# Patient Record
Sex: Female | Born: 1950
Health system: Southern US, Community
[De-identification: ages and names within clinical notes are randomized; demographics above are authoritative.]

## PROBLEM LIST (undated history)

## (undated) DIAGNOSIS — G43909 Migraine, unspecified, not intractable, without status migrainosus: Secondary | ICD-10-CM

## (undated) DIAGNOSIS — M1990 Unspecified osteoarthritis, unspecified site: Secondary | ICD-10-CM

## (undated) DIAGNOSIS — R000 Tachycardia, unspecified: Secondary | ICD-10-CM

## (undated) DIAGNOSIS — E876 Hypokalemia: Secondary | ICD-10-CM

## (undated) DIAGNOSIS — R0609 Other forms of dyspnea: Secondary | ICD-10-CM

## (undated) DIAGNOSIS — G20 Parkinson's disease: Secondary | ICD-10-CM

## (undated) DIAGNOSIS — E7800 Pure hypercholesterolemia, unspecified: Secondary | ICD-10-CM

## (undated) DIAGNOSIS — B019 Varicella without complication: Secondary | ICD-10-CM

## (undated) DIAGNOSIS — E669 Obesity, unspecified: Secondary | ICD-10-CM

## (undated) DIAGNOSIS — D649 Anemia, unspecified: Secondary | ICD-10-CM

## (undated) DIAGNOSIS — M549 Dorsalgia, unspecified: Secondary | ICD-10-CM

## (undated) DIAGNOSIS — F32A Depression, unspecified: Secondary | ICD-10-CM

## (undated) DIAGNOSIS — R7303 Prediabetes: Secondary | ICD-10-CM

## (undated) DIAGNOSIS — G20A1 Parkinson's disease without dyskinesia, without mention of fluctuations: Secondary | ICD-10-CM

## (undated) DIAGNOSIS — I10 Essential (primary) hypertension: Secondary | ICD-10-CM

## (undated) DIAGNOSIS — R0600 Dyspnea, unspecified: Secondary | ICD-10-CM

## (undated) DIAGNOSIS — G8929 Other chronic pain: Secondary | ICD-10-CM

## (undated) DIAGNOSIS — H269 Unspecified cataract: Secondary | ICD-10-CM

## (undated) DIAGNOSIS — F329 Major depressive disorder, single episode, unspecified: Secondary | ICD-10-CM

## (undated) DIAGNOSIS — I5189 Other ill-defined heart diseases: Secondary | ICD-10-CM

## (undated) DIAGNOSIS — F419 Anxiety disorder, unspecified: Secondary | ICD-10-CM

## (undated) DIAGNOSIS — K219 Gastro-esophageal reflux disease without esophagitis: Secondary | ICD-10-CM

## (undated) HISTORY — DX: Prediabetes: R73.03

## (undated) HISTORY — DX: Major depressive disorder, single episode, unspecified: F32.9

## (undated) HISTORY — DX: Dyspnea, unspecified: R06.00

## (undated) HISTORY — PX: FOOT SURGERY: SHX648

## (undated) HISTORY — DX: Other ill-defined heart diseases: I51.89

## (undated) HISTORY — DX: Obesity, unspecified: E66.9

## (undated) HISTORY — DX: Other chronic pain: G89.29

## (undated) HISTORY — DX: Other forms of dyspnea: R06.09

## (undated) HISTORY — DX: Migraine, unspecified, not intractable, without status migrainosus: G43.909

## (undated) HISTORY — PX: CARDIAC CATHETERIZATION: SHX172

## (undated) HISTORY — DX: Gastro-esophageal reflux disease without esophagitis: K21.9

## (undated) HISTORY — DX: Dorsalgia, unspecified: M54.9

## (undated) HISTORY — DX: Depression, unspecified: F32.A

## (undated) HISTORY — DX: Essential (primary) hypertension: I10

## (undated) HISTORY — DX: Tachycardia, unspecified: R00.0

## (undated) HISTORY — DX: Hypokalemia: E87.6

## (undated) HISTORY — DX: Unspecified cataract: H26.9

## (undated) HISTORY — DX: Pure hypercholesterolemia, unspecified: E78.00

## (undated) HISTORY — DX: Varicella without complication: B01.9

---
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
## 2000-04-15 ENCOUNTER — Encounter: Admission: RE | Admit: 2000-04-15 | Discharge: 2000-04-15 | Payer: Self-pay | Admitting: Family Medicine

## 2000-04-15 ENCOUNTER — Encounter: Payer: Self-pay | Admitting: Family Medicine

## 2003-07-20 HISTORY — PX: BLADDER SUSPENSION: SHX72

## 2005-03-26 ENCOUNTER — Ambulatory Visit (HOSPITAL_COMMUNITY): Admission: RE | Admit: 2005-03-26 | Discharge: 2005-03-26 | Payer: Self-pay | Admitting: Gynecology

## 2005-07-19 HISTORY — PX: ENDOMETRIAL ABLATION: SHX621

## 2005-11-04 ENCOUNTER — Ambulatory Visit: Payer: Self-pay | Admitting: Internal Medicine

## 2005-11-10 ENCOUNTER — Ambulatory Visit: Payer: Self-pay | Admitting: Internal Medicine

## 2005-11-12 ENCOUNTER — Inpatient Hospital Stay (HOSPITAL_BASED_OUTPATIENT_CLINIC_OR_DEPARTMENT_OTHER): Admission: RE | Admit: 2005-11-12 | Discharge: 2005-11-12 | Payer: Self-pay | Admitting: Internal Medicine

## 2005-11-12 ENCOUNTER — Ambulatory Visit: Payer: Self-pay | Admitting: Internal Medicine

## 2005-12-23 ENCOUNTER — Ambulatory Visit: Payer: Self-pay | Admitting: Internal Medicine

## 2005-12-28 ENCOUNTER — Ambulatory Visit: Payer: Self-pay | Admitting: Internal Medicine

## 2006-01-10 ENCOUNTER — Ambulatory Visit: Payer: Self-pay | Admitting: Internal Medicine

## 2006-01-21 ENCOUNTER — Ambulatory Visit: Payer: Self-pay | Admitting: Internal Medicine

## 2006-01-24 ENCOUNTER — Ambulatory Visit: Payer: Self-pay | Admitting: Internal Medicine

## 2006-01-25 ENCOUNTER — Ambulatory Visit: Payer: Self-pay | Admitting: Internal Medicine

## 2006-01-28 ENCOUNTER — Ambulatory Visit: Payer: Self-pay | Admitting: Internal Medicine

## 2006-03-14 ENCOUNTER — Ambulatory Visit: Payer: Self-pay | Admitting: Internal Medicine

## 2006-03-18 ENCOUNTER — Ambulatory Visit: Payer: Self-pay | Admitting: Internal Medicine

## 2006-03-28 ENCOUNTER — Ambulatory Visit: Payer: Self-pay | Admitting: Internal Medicine

## 2006-06-06 ENCOUNTER — Ambulatory Visit: Payer: Self-pay | Admitting: Gynecology

## 2006-06-06 ENCOUNTER — Encounter (INDEPENDENT_AMBULATORY_CARE_PROVIDER_SITE_OTHER): Payer: Self-pay | Admitting: Gynecology

## 2006-06-22 ENCOUNTER — Ambulatory Visit: Payer: Self-pay | Admitting: Gynecology

## 2006-08-11 ENCOUNTER — Encounter: Admission: RE | Admit: 2006-08-11 | Discharge: 2006-08-11 | Payer: Self-pay | Admitting: Nephrology

## 2007-01-17 ENCOUNTER — Ambulatory Visit: Payer: Self-pay | Admitting: Gynecology

## 2007-06-07 ENCOUNTER — Ambulatory Visit: Payer: Self-pay | Admitting: Internal Medicine

## 2007-06-07 LAB — CONVERTED CEMR LAB
BUN: 11 mg/dL (ref 6–23)
CO2: 24 meq/L (ref 19–32)
Calcium: 8.9 mg/dL (ref 8.4–10.5)
Chloride: 110 meq/L (ref 96–112)
Creatinine, Ser: 0.7 mg/dL (ref 0.4–1.2)
GFR calc Af Amer: 112 mL/min
GFR calc non Af Amer: 92 mL/min
Glucose, Bld: 118 mg/dL — ABNORMAL HIGH (ref 70–99)
Potassium: 3.6 meq/L (ref 3.5–5.1)
Sodium: 140 meq/L (ref 135–145)
TSH: 0.94 microintl units/mL (ref 0.35–5.50)

## 2007-06-12 ENCOUNTER — Encounter: Payer: Self-pay | Admitting: Obstetrics & Gynecology

## 2007-06-12 ENCOUNTER — Ambulatory Visit: Payer: Self-pay | Admitting: Gynecology

## 2007-07-24 ENCOUNTER — Ambulatory Visit: Payer: Self-pay | Admitting: Gynecology

## 2007-07-24 ENCOUNTER — Ambulatory Visit: Payer: Self-pay | Admitting: Gastroenterology

## 2007-08-07 ENCOUNTER — Ambulatory Visit: Payer: Self-pay | Admitting: Gastroenterology

## 2007-08-07 ENCOUNTER — Encounter: Payer: Self-pay | Admitting: Internal Medicine

## 2007-11-15 ENCOUNTER — Ambulatory Visit: Payer: Self-pay | Admitting: Internal Medicine

## 2008-01-10 ENCOUNTER — Ambulatory Visit: Payer: Self-pay | Admitting: Internal Medicine

## 2008-01-17 ENCOUNTER — Ambulatory Visit: Payer: Self-pay | Admitting: Internal Medicine

## 2008-01-17 LAB — CONVERTED CEMR LAB
BUN: 17 mg/dL (ref 6–23)
CO2: 23 meq/L (ref 19–32)
Calcium: 9.9 mg/dL (ref 8.4–10.5)
Chloride: 98 meq/L (ref 96–112)
Creatinine, Ser: 0.78 mg/dL (ref 0.40–1.20)
Glucose, Bld: 77 mg/dL (ref 70–99)
Potassium: 2.9 meq/L — ABNORMAL LOW (ref 3.5–5.3)
Sodium: 140 meq/L (ref 135–145)

## 2008-01-25 ENCOUNTER — Ambulatory Visit: Payer: Self-pay | Admitting: Internal Medicine

## 2008-01-25 LAB — CONVERTED CEMR LAB
BUN: 13 mg/dL (ref 6–23)
CO2: 24 meq/L (ref 19–32)
Calcium: 9.4 mg/dL (ref 8.4–10.5)
Chloride: 101 meq/L (ref 96–112)
Creatinine, Ser: 0.71 mg/dL (ref 0.40–1.20)
Glucose, Bld: 84 mg/dL (ref 70–99)
Potassium: 3.4 meq/L — ABNORMAL LOW (ref 3.5–5.3)
Sodium: 139 meq/L (ref 135–145)

## 2008-02-01 ENCOUNTER — Ambulatory Visit: Payer: Self-pay | Admitting: Internal Medicine

## 2008-02-01 LAB — CONVERTED CEMR LAB
BUN: 12 mg/dL (ref 6–23)
CO2: 20 meq/L (ref 19–32)
Calcium: 9.5 mg/dL (ref 8.4–10.5)
Chloride: 102 meq/L (ref 96–112)
Creatinine, Ser: 0.94 mg/dL (ref 0.40–1.20)
Glucose, Bld: 139 mg/dL — ABNORMAL HIGH (ref 70–99)
Potassium: 3.2 meq/L — ABNORMAL LOW (ref 3.5–5.3)
Sodium: 137 meq/L (ref 135–145)

## 2008-02-07 ENCOUNTER — Ambulatory Visit: Payer: Self-pay | Admitting: Internal Medicine

## 2008-02-07 LAB — CONVERTED CEMR LAB
BUN: 15 mg/dL (ref 6–23)
CO2: 22 meq/L (ref 19–32)
Calcium: 9.2 mg/dL (ref 8.4–10.5)
Chloride: 104 meq/L (ref 96–112)
Creatinine, Ser: 0.82 mg/dL (ref 0.40–1.20)
Glucose, Bld: 133 mg/dL — ABNORMAL HIGH (ref 70–99)
Potassium: 3.3 meq/L — ABNORMAL LOW (ref 3.5–5.3)
Sodium: 140 meq/L (ref 135–145)

## 2008-02-16 ENCOUNTER — Ambulatory Visit: Payer: Self-pay | Admitting: Internal Medicine

## 2008-02-16 LAB — CONVERTED CEMR LAB
BUN: 15 mg/dL (ref 6–23)
CO2: 23 meq/L (ref 19–32)
Calcium: 9.9 mg/dL (ref 8.4–10.5)
Chloride: 101 meq/L (ref 96–112)
Creatinine, Ser: 0.82 mg/dL (ref 0.40–1.20)
Glucose, Bld: 103 mg/dL — ABNORMAL HIGH (ref 70–99)
Potassium: 3.2 meq/L — ABNORMAL LOW (ref 3.5–5.3)
Sodium: 140 meq/L (ref 135–145)

## 2008-02-26 ENCOUNTER — Ambulatory Visit: Payer: Self-pay | Admitting: Cardiology

## 2008-02-26 ENCOUNTER — Encounter: Payer: Self-pay | Admitting: Internal Medicine

## 2008-02-26 LAB — CONVERTED CEMR LAB
BUN: 15 mg/dL (ref 6–23)
CO2: 23 meq/L (ref 19–32)
Calcium: 9.4 mg/dL (ref 8.4–10.5)
Chloride: 103 meq/L (ref 96–112)
Creatinine, Ser: 0.78 mg/dL (ref 0.40–1.20)
Glucose, Bld: 140 mg/dL — ABNORMAL HIGH (ref 70–99)
Potassium: 3.2 meq/L — ABNORMAL LOW (ref 3.5–5.3)
Sodium: 140 meq/L (ref 135–145)

## 2008-03-06 ENCOUNTER — Ambulatory Visit: Payer: Self-pay | Admitting: Internal Medicine

## 2008-03-06 LAB — CONVERTED CEMR LAB
Calcium: 8.7 mg/dL (ref 8.4–10.5)
Glucose, Bld: 134 mg/dL — ABNORMAL HIGH (ref 70–99)
Potassium: 4.6 meq/L (ref 3.5–5.3)
Sodium: 140 meq/L (ref 135–145)

## 2008-03-13 ENCOUNTER — Ambulatory Visit: Payer: Self-pay | Admitting: Internal Medicine

## 2008-03-13 LAB — CONVERTED CEMR LAB
CO2: 24 meq/L (ref 19–32)
Calcium: 10.2 mg/dL (ref 8.4–10.5)
Potassium: 3.9 meq/L (ref 3.5–5.3)
Sodium: 139 meq/L (ref 135–145)

## 2008-04-29 ENCOUNTER — Ambulatory Visit: Payer: Self-pay | Admitting: Internal Medicine

## 2008-04-29 LAB — CONVERTED CEMR LAB
CO2: 19 meq/L (ref 19–32)
Chloride: 101 meq/L (ref 96–112)
Creatinine, Ser: 0.76 mg/dL (ref 0.40–1.20)
Glucose, Bld: 149 mg/dL — ABNORMAL HIGH (ref 70–99)

## 2008-05-09 ENCOUNTER — Ambulatory Visit: Payer: Self-pay | Admitting: Cardiology

## 2008-05-10 LAB — CONVERTED CEMR LAB
BUN: 15 mg/dL (ref 6–23)
CO2: 23 meq/L (ref 19–32)
Calcium: 9.5 mg/dL (ref 8.4–10.5)
Chloride: 101 meq/L (ref 96–112)
Creatinine, Ser: 0.85 mg/dL (ref 0.40–1.20)

## 2008-05-27 ENCOUNTER — Encounter: Payer: Self-pay | Admitting: Family Medicine

## 2008-05-27 ENCOUNTER — Ambulatory Visit: Payer: Self-pay | Admitting: Family Medicine

## 2008-05-29 ENCOUNTER — Ambulatory Visit: Payer: Self-pay | Admitting: Cardiology

## 2008-05-29 LAB — CONVERTED CEMR LAB
AST: 14 units/L (ref 0–37)
Alkaline Phosphatase: 125 units/L — ABNORMAL HIGH (ref 39–117)
BUN: 20 mg/dL (ref 6–23)
Creatinine, Ser: 0.77 mg/dL (ref 0.40–1.20)
HDL: 53 mg/dL (ref >39)
LDL Cholesterol: 119 mg/dL — ABNORMAL HIGH (ref 0–99)
Total CHOL/HDL Ratio: 3.8
VLDL: 29 mg/dL (ref 0–40)

## 2008-09-04 ENCOUNTER — Encounter: Payer: Self-pay | Admitting: Internal Medicine

## 2008-09-04 ENCOUNTER — Ambulatory Visit: Payer: Self-pay | Admitting: Internal Medicine

## 2008-09-04 DIAGNOSIS — E66811 Obesity, class 1: Secondary | ICD-10-CM | POA: Insufficient documentation

## 2008-09-04 DIAGNOSIS — R0789 Other chest pain: Secondary | ICD-10-CM | POA: Insufficient documentation

## 2008-09-04 DIAGNOSIS — R079 Chest pain, unspecified: Secondary | ICD-10-CM | POA: Insufficient documentation

## 2008-09-04 DIAGNOSIS — I10 Essential (primary) hypertension: Secondary | ICD-10-CM | POA: Insufficient documentation

## 2008-09-04 DIAGNOSIS — E876 Hypokalemia: Secondary | ICD-10-CM | POA: Insufficient documentation

## 2008-09-04 DIAGNOSIS — E785 Hyperlipidemia, unspecified: Secondary | ICD-10-CM | POA: Insufficient documentation

## 2008-09-04 DIAGNOSIS — E669 Obesity, unspecified: Secondary | ICD-10-CM | POA: Insufficient documentation

## 2008-09-05 ENCOUNTER — Ambulatory Visit: Payer: Self-pay | Admitting: Cardiology

## 2008-09-05 LAB — CONVERTED CEMR LAB
ALT: 11 units/L (ref 0–35)
AST: 14 units/L (ref 0–37)
Alkaline Phosphatase: 127 units/L — ABNORMAL HIGH (ref 39–117)
LDL Cholesterol: 100 mg/dL — ABNORMAL HIGH (ref 0–99)
Sodium: 144 meq/L (ref 135–145)
Total Bilirubin: 0.3 mg/dL (ref 0.3–1.2)
Total Protein: 6.7 g/dL (ref 6.0–8.3)
Triglycerides: 142 mg/dL (ref <150)
VLDL: 28 mg/dL (ref 0–40)

## 2008-09-09 ENCOUNTER — Encounter: Payer: Self-pay | Admitting: Internal Medicine

## 2008-11-25 ENCOUNTER — Ambulatory Visit: Payer: Self-pay | Admitting: Obstetrics & Gynecology

## 2008-11-26 ENCOUNTER — Encounter: Payer: Self-pay | Admitting: Family Medicine

## 2008-12-18 ENCOUNTER — Encounter: Payer: Self-pay | Admitting: Internal Medicine

## 2008-12-18 ENCOUNTER — Ambulatory Visit: Payer: Self-pay | Admitting: Internal Medicine

## 2008-12-19 ENCOUNTER — Ambulatory Visit: Payer: Self-pay | Admitting: Internal Medicine

## 2008-12-19 ENCOUNTER — Ambulatory Visit: Payer: Self-pay | Admitting: Obstetrics & Gynecology

## 2008-12-23 ENCOUNTER — Telehealth: Payer: Self-pay | Admitting: Internal Medicine

## 2008-12-23 LAB — CONVERTED CEMR LAB
AST: 19 units/L (ref 0–37)
Albumin: 4.3 g/dL (ref 3.5–5.2)
Alkaline Phosphatase: 111 units/L (ref 39–117)
BUN: 22 mg/dL (ref 6–23)
HDL: 53 mg/dL (ref >39)
LDL Cholesterol: 127 mg/dL — ABNORMAL HIGH (ref 0–99)
Potassium: 4.4 meq/L (ref 3.5–5.3)
Sodium: 141 meq/L (ref 135–145)

## 2009-01-27 ENCOUNTER — Telehealth: Payer: Self-pay | Admitting: Internal Medicine

## 2009-03-20 ENCOUNTER — Encounter: Payer: Self-pay | Admitting: Internal Medicine

## 2009-03-20 ENCOUNTER — Ambulatory Visit: Payer: Self-pay

## 2009-03-20 ENCOUNTER — Ambulatory Visit: Payer: Self-pay | Admitting: Internal Medicine

## 2009-03-20 DIAGNOSIS — R0602 Shortness of breath: Secondary | ICD-10-CM | POA: Insufficient documentation

## 2009-03-20 DIAGNOSIS — R0609 Other forms of dyspnea: Secondary | ICD-10-CM | POA: Insufficient documentation

## 2009-03-20 DIAGNOSIS — R0989 Other specified symptoms and signs involving the circulatory and respiratory systems: Secondary | ICD-10-CM

## 2009-03-21 ENCOUNTER — Telehealth: Payer: Self-pay | Admitting: Internal Medicine

## 2009-05-01 ENCOUNTER — Ambulatory Visit: Payer: Self-pay | Admitting: Pulmonary Disease

## 2009-05-01 DIAGNOSIS — G4733 Obstructive sleep apnea (adult) (pediatric): Secondary | ICD-10-CM | POA: Insufficient documentation

## 2009-05-30 ENCOUNTER — Ambulatory Visit: Payer: Self-pay | Admitting: Internal Medicine

## 2009-06-02 ENCOUNTER — Telehealth: Payer: Self-pay | Admitting: Internal Medicine

## 2009-06-26 ENCOUNTER — Ambulatory Visit (HOSPITAL_COMMUNITY): Admission: RE | Admit: 2009-06-26 | Discharge: 2009-06-26 | Payer: Self-pay | Admitting: Obstetrics & Gynecology

## 2009-06-26 ENCOUNTER — Ambulatory Visit: Payer: Self-pay | Admitting: Obstetrics & Gynecology

## 2009-07-01 ENCOUNTER — Ambulatory Visit: Payer: Self-pay | Admitting: Obstetrics & Gynecology

## 2009-11-21 ENCOUNTER — Ambulatory Visit: Payer: Self-pay | Admitting: Internal Medicine

## 2009-11-24 ENCOUNTER — Telehealth: Payer: Self-pay | Admitting: Internal Medicine

## 2009-12-03 LAB — CONVERTED CEMR LAB
ALT: 11 units/L (ref 0–35)
AST: 17 units/L (ref 0–37)
Albumin: 4.4 g/dL (ref 3.5–5.2)
CO2: 20 meq/L (ref 19–32)
Calcium: 9.9 mg/dL (ref 8.4–10.5)
Cholesterol: 219 mg/dL — ABNORMAL HIGH (ref 0–200)
HDL: 52 mg/dL (ref >39)
Sodium: 141 meq/L (ref 135–145)
Total Bilirubin: 0.4 mg/dL (ref 0.3–1.2)
Total CHOL/HDL Ratio: 4.2
Total Protein: 7.1 g/dL (ref 6.0–8.3)
Triglycerides: 193 mg/dL — ABNORMAL HIGH (ref <150)

## 2009-12-11 ENCOUNTER — Ambulatory Visit: Payer: Self-pay | Admitting: Internal Medicine

## 2010-02-11 ENCOUNTER — Encounter (INDEPENDENT_AMBULATORY_CARE_PROVIDER_SITE_OTHER): Payer: Self-pay | Admitting: *Deleted

## 2010-02-12 ENCOUNTER — Ambulatory Visit: Payer: Self-pay | Admitting: Obstetrics and Gynecology

## 2010-02-19 ENCOUNTER — Telehealth: Payer: Self-pay | Admitting: Internal Medicine

## 2010-02-19 ENCOUNTER — Ambulatory Visit: Payer: Self-pay | Admitting: Obstetrics & Gynecology

## 2010-02-25 ENCOUNTER — Ambulatory Visit: Payer: Self-pay | Admitting: Obstetrics & Gynecology

## 2010-04-16 ENCOUNTER — Encounter (INDEPENDENT_AMBULATORY_CARE_PROVIDER_SITE_OTHER): Payer: Self-pay | Admitting: *Deleted

## 2010-06-30 ENCOUNTER — Telehealth: Payer: Self-pay | Admitting: Internal Medicine

## 2010-07-02 ENCOUNTER — Ambulatory Visit: Payer: Self-pay

## 2010-07-09 ENCOUNTER — Encounter (INDEPENDENT_AMBULATORY_CARE_PROVIDER_SITE_OTHER): Payer: Self-pay | Admitting: *Deleted

## 2010-07-09 ENCOUNTER — Ambulatory Visit: Payer: Self-pay | Admitting: Cardiovascular Disease

## 2010-07-15 ENCOUNTER — Telehealth: Payer: Self-pay | Admitting: Internal Medicine

## 2010-07-23 ENCOUNTER — Ambulatory Visit: Admit: 2010-07-23 | Payer: Self-pay | Admitting: Gastroenterology

## 2010-07-27 LAB — CONVERTED CEMR LAB
CO2: 18 meq/L — ABNORMAL LOW (ref 19–32)
Calcium: 9.2 mg/dL (ref 8.4–10.5)
Creatinine, Ser: 0.71 mg/dL (ref 0.40–1.20)
Glucose, Bld: 95 mg/dL (ref 70–99)

## 2010-08-20 NOTE — Progress Notes (Signed)
Summary: Headaches   Phone Note Call from Patient Call back at Work Phone [phone]   Caller: Self Call For: Bensimhon Summary of Call: Pt is having headaches and feels that they are related to her BP. Initial call taken by: Harlon Flor,  June 30, 2010 8:39 AM  Follow-up for Phone Call        Spoke to pt, she states has had persistent HA yesterday and today and wants to know if r/t BP. Current BP is 139/90, BP at last ov 126/80. Pt has not changed any meds. She takes carvedilol, spironolactone w/ K. Pt takes Topamax to prevent migraines. She has been taking Naproxen and states some relief but HA returns. Informed pt it's possible could be r/t BP but could be other factors as well. Instructed her to increase fluid intake and try Tylenol or Ibuprofen for relief and to montior BP and notify office if continues to increase or addition of any other symptoms. Pt would like to know if she needs a Potassium level checked (she has f/u appt 08/2010 scheduled), do you recommend one before then? Follow-up by: Lanny Hurst RN,  June 30, 2010 12:11 PM  Additional Follow-up for Phone Call Additional follow up Details #1::        HA not likely due to HTN. Ok to repeat BMET and f/u on K. Dolores Patty, MD, [facility]  June 30, 2010 5:49 PM      Appended Document: Headaches Attempted to call pt back left msg TCB at her work number /MES  Appended Document: Headaches pt notified of above, appt made for BMP per Dr. Gala Romney

## 2010-08-20 NOTE — Progress Notes (Signed)
Summary: lab results  Phone Note Call from Patient Call back at Home Phone [phone]   Summary of Call: Pt had labwork on Friday was wondering if lab results were back in  Initial call taken by: Park Breed,  Nov 24, 2009 4:39 PM  Follow-up for Phone Call        Results back, have not yet been reviewed by MD.  Attempted TCB pt.  LMOM TCB. Cloyde Reams RN  Nov 25, 2009 11:18 AM   PT AWARE OF RESULTS WE CAN CALL WITH ANY CHANGES. kl:) Follow-up by: Mercer Pod,  Nov 25, 2009 4:06 PM

## 2010-08-20 NOTE — Assessment & Plan Note (Signed)
Summary: F6M/AMD  Medications Added ABILIFY 2 MG TABS (ARIPIPRAZOLE) once daily VITAMIN B-12 500 MCG  TABS (CYANOCOBALAMIN) once daily VITAMIN D 1000 UNIT  TABS (CHOLECALCIFEROL) once daily SIMVASTATIN 20 MG TABS (SIMVASTATIN) Take 1 tablet by mouth once a day      Allergies Added:   Visit Type:  Follow-up Referring Provider:  Arvilla Meres Primary Provider:  Leonette Most Phillips([place])  CC:  chest pain / shortness of breath.  History of Present Illness: Katherine [name] is a 60 y/o woman with a h/o CP with no CAD on cath 2007, HTN and obesity. She returns today for routine follow-up.   At last visit we ordered echo and sent her to pulmonary for sleep study for further evaluation of dyspnea. ECHO showed EF 65% no signifcant valvular disease. Saw Dr. Shelle Iron who was very concerned about OSA and recommended sleep study which has not yet been scheduled.  Continues with  occasional pain in chest and shoulders without change. Chronic dyspnea. Very stressed out at work. Not exercisingl. No edema, PND or orthopnea. Not following BP closely. Watches her diet closley and avoids fatty foods. Most recent lipids TC 219 TG 153 HDL 53 LDL 128    Current Medications (verified): 1)  Abilify 2 Mg Tabs (Aripiprazole) .... Once Daily 2)  Concerta 18 Mg Cr-Tabs (Methylphenidate Hcl) .... Take 1 Tablet By Mouth Once A Day 3)  Topamax 50 Mg Tabs (Topiramate) .... 1.5 Tabs By Mouth Once Daily 4)  Cymbalta 30 Mg Cpep (Duloxetine Hcl) .... 3 Tabs By Mouth Qd 5)  Adult Aspirin Low Strength 81 Mg Tbdp (Aspirin) .... Take 1 Tablet By Mouth Once A Day 6)  Potassium Chloride 20 Meq Pack (Potassium Chloride) .... 2 Tabs By Mouth Bid 7)  Carvedilol 12.5 Mg Tabs (Carvedilol) .Marland Kitchen.. 1 Tab Two Times A Day 8)  Spironolactone 25 Mg Tabs (Spironolactone) .Marland Kitchen.. 1 Tab Two Times A Day 9)  Vitamin B-12 500 Mcg  Tabs (Cyanocobalamin) .... Once Daily 10)  Vitamin D 1000 Unit  Tabs (Cholecalciferol) .... Once Daily  Allergies  (verified): 1)  ! Darvon  Past History:  Past Medical History: Last updated: 09/04/2008 1) Chest pain      --normal coronaries by cath 2007      --anomalous RCA arising from LAD diagonal (versus total native RCA with collateral) 2) HTN 3) Hypokalemia 4) Obesity 5) Migraine headaches 6) Depression  Vital Signs:  Patient profile:   60 year old female Height:      63 inches Weight:      188 pounds BMI:     33.42 Pulse rate:   86 / minute BP sitting:   126 / 80  (left arm) Cuff size:   regular  Vitals Entered By: Hardin Negus, RMA (Dec 11, 2009 4:10 PM)  Physical Exam  General:  Gen: well appearing. no resp difficulty HEENT: normal Neck: supple. no JVD. Carotids 2+ bilat; no bruits. No lymphadenopathy or thryomegaly appreciated. Cor: PMI nondisplaced. Regular rate & rhythm. No rubs. + s4 Lungs: clear Abdomen: obese soft, nontender, nondistended.  Good bowel sounds. Extremities: no cyanosis, clubbing, rash. tr edema Neuro: alert & orientedx3, cranial nerves grossly intact. moves all 4 extremities w/o difficulty. affect pleasant    Impression & Recommendations:  Problem # 1:  CHEST PAIN-UNSPECIFIED (ICD-786.50) Farily atypical. Cath 4 years ago was normal. Doubt cardiac but if persists will schedule her for treadmill at [facility].   Problem # 2:  HYPERLIPIDEMIA-MIXED (ICD-272.4) Lipids mildly elevated despite fairly good diet. Will start  simva 20.   Other Orders: EKG w/ Interpretation (93000)  Patient Instructions: 1)  Your physician recommends that you schedule a follow-up appointment in: 9 months 2)  Your physician has recommended you make the following change in your medication: Start taking Simvastatin 20mg  daily.   3)  Call us back if chest pain persists for further testing. Prescriptions: SIMVASTATIN 20 MG TABS (SIMVASTATIN) Take 1 tablet by mouth once a day  #30 x 6   Entered by:   Cloyde Reams RN   Authorized by:   Dolores Patty, MD, [facility]   Signed by:    Cloyde Reams RN on 12/11/2009   Method used:   Electronically to        [id number] (retail)       [address]       [place], [place]  [address]       Ph: [phone]       Fax: [phone]   RxID:   [id number]

## 2010-08-20 NOTE — Progress Notes (Signed)
Summary: Lab   Phone Note Call from Patient Call back at Home Phone [phone]   Caller: Self Call For: Katherine [name] Summary of Call: Pt is calling about lab results. Initial call taken by: Harlon Flor,  July 15, 2010 10:46 AM  Follow-up for Phone Call        Pt notified BMP for 07/09/10 was normal, however, Dr. Gala Romney had not commented on anything and if he has anything to add I will call her. Follow-up by: Lanny Hurst RN,  July 15, 2010 11:43 AM

## 2010-08-20 NOTE — Letter (Signed)
Summary: Appointment - Missed  [facility], Main Office  [address]   [place], [place] [address]   Phone: [phone]  Fax: [phone]     July 09, 2010 MRN: [id number]   Katherine [name] [address] [place], [place]  [address]   Dear Ms. RICKLES,  Our records indicate you missed your appointment on  07/02/10  with Dr. Myrtis Ser .It is very important that we reach you to reschedule this appointment. We look forward to participating in your health care needs. Please contact us at the number listed above at your earliest convenience to reschedule this appointment.     Sincerely,  Artist

## 2010-08-20 NOTE — Letter (Signed)
Summary: New Patient letter  [facility]  [address] [place], [place] [address]   Phone: [phone]  Fax: [phone]       04/16/2010 MRN: [id number]  Katherine [name] [address] [place], [place]  [address]  Dear Katherine [name],  Welcome to the Gastroenterology Division at [facility].    You are scheduled to see Dr.  Arlyce Dice on 06/08/2010 at 3:45pm on the 3rd floor at [facility], [address]  We ask that you try to arrive at our office 15 minutes prior to your appointment time to allow for check-in.  We would like you to complete the enclosed self-administered evaluation form prior to your visit and bring it with you on the day of your appointment.  We will review it with you.  Also, please bring a complete list of all your medications or, if you prefer, bring the medication bottles and we will list them.  Please bring your insurance card so that we may make a copy of it.  If your insurance requires a referral to see a specialist, please bring your referral form from your primary care physician.  Co-payments are due at the time of your visit and may be paid by cash, check or credit card.     Your office visit will consist of a consult with your physician (includes a physical exam), any laboratory testing he/she may order, scheduling of any necessary diagnostic testing (e.g. x-ray, ultrasound, CT-scan), and scheduling of a procedure (e.g. Endoscopy, Colonoscopy) if required.  Please allow enough time on your schedule to allow for any/all of these possibilities.    If you cannot keep your appointment, please call [phone] to cancel or reschedule prior to your appointment date.  This allows [place] the opportunity to schedule an appointment for another patient in need of care.  If you do not cancel or reschedule by 5 p.m. the business day prior to your appointment date, you will be charged a $50.00 late cancellation/no-show fee.    Thank you for choosing  [facility] for your medical needs.  We appreciate the opportunity to care for you.  Please visit [place] at our website  to learn more about our practice.                     Sincerely,                                                             The Gastroenterology Division

## 2010-08-20 NOTE — Progress Notes (Signed)
Summary: QUESTION ABOUT DRUG INTERACTION  Phone Note Call from Patient   Summary of Call: PT STATED SHE WAS JUST STARTED ON SIMVASTIN AND SHE RECEIVED A LETTER FROM [employer] STATING THAT SHE MAY HAVE INTERACTIONS WITH OTHER MEDS SHE IS TAKING.  SHE IS CONCERNED.  PLEASE CALL HER AND TELL HER WHAT SHE SHOULD DO. [phone] Initial call taken by: Park Breed,  February 19, 2010 3:16 PM  Follow-up for Phone Call        Page over [place] with pt to make sure it was up to date. Check my nursing drug book and saw no interactions with the meds she is on. I asked her to call her pharmacist it may be that she can not take the simvastatin at the same time as another med.  Follow-up by: Benedict Needy, RN,  February 20, 2010 9:40 AM

## 2010-09-22 ENCOUNTER — Ambulatory Visit: Payer: Self-pay | Admitting: Internal Medicine

## 2010-09-23 ENCOUNTER — Ambulatory Visit: Payer: Self-pay | Admitting: Internal Medicine

## 2010-10-20 LAB — CBC
Platelets: 329 10*3/uL (ref 150–400)
RBC: 4.51 MIL/uL (ref 3.87–5.11)
WBC: 11.6 10*3/uL — ABNORMAL HIGH (ref 4.0–10.5)

## 2010-10-20 LAB — BASIC METABOLIC PANEL
BUN: 23 mg/dL (ref 6–23)
Calcium: 9.5 mg/dL (ref 8.4–10.5)
Creatinine, Ser: 0.92 mg/dL (ref 0.4–1.2)
GFR calc Af Amer: >60 mL/min (ref >60)
GFR calc non Af Amer: >60 mL/min (ref >60)

## 2010-11-16 ENCOUNTER — Other Ambulatory Visit: Payer: Self-pay | Admitting: *Deleted

## 2010-11-16 MED ORDER — SPIRONOLACTONE 25 MG PO TABS: 25.0000 mg | ORAL_TABLET | Freq: Two times a day (BID) | ORAL | Status: DC

## 2010-12-01 NOTE — Assessment & Plan Note (Signed)
[facility] OFFICE NOTE   Katherine [name], Katherine [name]                      MRN:          [id number]  DATE:02/16/2008                            DOB:          1950-11-03    PRIMARY CARE PHYSICIAN:  Katherine Hair. Artist Pais, DO   INTERVAL HISTORY:  Katherine [name] is a very pleasant 60 year old woman with a  history of migraine headaches, hypertension, obesity, lower extremity  edema and noncardiac chest pain with normal coronary arteries by  catheterization in 2007.   She returns today for routine followup.  We have been titrating her  blood pressure medications.  Most recently we add diuretic.  She says  she is doing great now her lower extremity edema has resolved and her  systolic blood pressures have been in 120s.  She is doing great with her  diet and eating a lot of fruits and fresh vegetable and trying to get  more exercise and she has lost another 6 pounds.  She has not had any  significant chest pain.   CURRENT MEDICATIONS:  1. Abilify 1 mg a day.  2. Chlorthalidone 25 a day.  3. Concerta 18 mg a day.  4. Topamax 75 mg a day.  5. Cymbalta 90 mg a day.  6. Aspirin 81 mg a day.  7. Potassium 40 b.i.d.  8. Coreg 12.5 b.i.d.   PHYSICAL EXAMINATION:  GENERAL:  She is well-appearing in no acute  distress.  She ambulates around the clinic without any respiratory  difficulty.  Blood pressure is 118/76, heart rate is 98, weight 180 which is down  from 194.  HEENT:  Normal.  NECK:  Supple.  No JVD, carotids are 2+ bilateral without bruits.  There  is no lymphadenopathy or thyromegaly.  CARDIAC:  PMI is nondisplaced.  Regular rate and rhythm.  No murmurs,  rubs or gallops.  LUNGS:  Clear.  ABDOMEN:  Obese, nontender, nondistended.  No appreciable  hepatosplenomegaly.  No bruits, no masses.  Good bowel sounds.  EXTREMITIES:  Warm.  No cyanosis, clubbing or edema.  No rash.  NEURO:  Alert and oriented x3.  Cranial nerves II-XII  are intact.  Moves  all 4 extremities without difficulty.  Affect is pleasant.   ASSESSMENT AND PLAN:  1. Hypertension.  Blood pressure looks great.  Continue current      therapy.  2. Hypokalemia.  She is on supplementation.  We will recheck her      potassium today.  3. Chest pain.  This is resolved, coronaries look good.   DISPOSITION:  I will check a BMET today and see her back for routine  followup in 4 months.  I congratulated her on her exercise and weight  loss program and I encouraged her to continue.     Katherine Buckles. Bensimhon, MD  Electronically Signed    DRB/MedQ  DD: 02/16/2008  DT: 02/17/2008  Job #: [id number]   cc:   Katherine Hair. Artist Pais, DO

## 2010-12-01 NOTE — Assessment & Plan Note (Signed)
NAMESHANESSA, Katherine [name]               ACCOUNT NO.:  [id number]   MEDICAL RECORD NO.:  [id number]          PATIENT TYPE:  POB   LOCATION:  [facility]         FACILITY:  [facility]   PHYSICIAN:  [name], MD        DATE OF BIRTH:  01/10/1951   DATE OF SERVICE:                                  CLINIC NOTE   HISTORY OF PRESENT ILLNESS:  Katherine [name] is a 60 year old divorced white  G1, P1 lady who is here to discuss new-onset genuine stress urinary  incontinence.  This happened about 2-1/2 months ago.  She does not  actually have to wear pads, but she just will wet her pants anytime she  coughs or sneezes.  She says it has been getting worse consistently  since started 2 months ago.  She had been doing Kegel without relief.   PAST MEDICAL HISTORY:  Hypertension, obesity, migraines, and newly  diagnosed osteoporosis.   REVIEW OF SYSTEMS:  She has been monogamous for the last 4 years.  She  lives with her boyfriend.  She works for the [employer]   PAST SURGICAL HISTORY:  She had an endometrial ablation by Dr. Mia Creek  in 2007.   ALLERGIES:  No latex allergies.   MEDICATIONS:  1. Coreg once a day.  2. Spironolactone once a day.  3. Calcium 1200 mg with vitamin D daily.  4. Potassium 80 mEq daily.  5. Zyrtec as necessary daily.   FAMILY HISTORY:  Positive for cervical cancer in her mother, but no  breast or colon malignancies in her family.   PHYSICAL EXAMINATION:  She has an obese abdomen with centripetal  obesity.  Her external genitalia is normal.  She is amazingly well  estrogenized for her age.  There is no atrophy in fact.  A bimanual exam  reveals normal size and shape, anteverted mobile uterus, and nonenlarged  adnexa.  She does have a positive Q-tip test.   ASSESSMENT AND PLAN:  Symptomatic stress urinary incontinence.  She  absolutely denies any detrusor instability symptoms.  I will check a  urinalysis and a urine culture to  make sure there is no issue there and  if not, I will be scheduling her for a midurethral sling with the  [employer].  I have discussed risks of surgery including, but not  limited to infection, bleeding, damage to bowel, bladder, and ureters.     [name], MD    MCD/MEDQ  D:  11/25/2008  T:  11/26/2008  Job:  [id number]

## 2010-12-01 NOTE — Assessment & Plan Note (Signed)
Katherine [name], Katherine [name]               ACCOUNT NO.:  [id number]   MEDICAL RECORD NO.:  [id number]          PATIENT TYPE:  POB   LOCATION:  [facility] at [facility]         FACILITY:  [facility]   PHYSICIAN:  Maylon Cos, CNM    DATE OF BIRTH:  [birth year]   DATE OF SERVICE:  05/27/2008                                  CLINIC NOTE   HISTORY:  The patient is being seen today, May 27, 2008, at [facility].  She presents today as a 61 year old divorced white female  who is here for her annual exam.   PAST MEDICAL HISTORY:  Positive for hypertension, obesity, and  migraines.   PAST SURGICAL HISTORY:  Positive for endometrial ablation in 2007.   ALLERGIES:  She has no known allergies to latex.  She has medication  allergies to Darvon.   SOCIAL HISTORY:  Negative for tobacco, alcohol, and substance abuse.   FAMILY HISTORY:  Positive for cervical cancer in her mother who died of  congestive heart failure.   REVIEW OF SYSTEMS:  Today, she has complaints of hot flashes.  She  discontinued her Prempro secondary to breast tenderness approximately 5  weeks ago.   PHYSICAL EXAMINATION:  VITAL SIGNS:  Evany weighs 190 pounds, her  height is 5 feet 3 inches, her blood pressure 112/84, and pulse 102.  HEENT:  Grossly normal.  She has good dentition.  HEART:  Regular rate and rhythm without murmurs or bruits.  LUNGS:  Clear to auscultation bilaterally.  BREASTS:  Normal bilaterally without dimpling.  No masses and they are  nontender.  EXTERNAL GENITALIA:  Tanner V without lesions or masses.  Mucous  membranes are pink with scant discharge.  Her uterus is the upper limit  of normal however it is mobile.  Adnexa are not enlarged.  No masses and  nontender.  Her cervix has no cervical motion tenderness.  It is smooth  without lesions.  ABDOMEN:  She has no palpable splenomegaly and bilateral femoral pulses  are equal.  EXTREMITIES:  She has full range of motion, even hair distribution.  Pedal pulses are equal and bounding bilaterally.   ASSESSMENT AND PLAN:  Annual exam consisting of Pap smear.  Recommend  breast exam.  She was requested to go back on a hormone replacement  therapy; however, she requested to be on patches.  She was given 0.05  __________ patch with instructions to change patches every 2 days.  Again, she was counseled towards weight loss, health maintenance,  mammography, and bone density were ordered.           ______________________________  Maylon Cos, CNM     SS/MEDQ  D:  05/27/2008  T:  05/28/2008  Job:  [id number]

## 2010-12-01 NOTE — Assessment & Plan Note (Signed)
NAMEKELSEI, DEFINO               ACCOUNT NO.:  [id number]   MEDICAL RECORD NO.:  [id number]          PATIENT TYPE:  POB   LOCATION:  [facility] at [facility]         FACILITY:  [facility]   PHYSICIAN:  [name], MD        DATE OF BIRTH:  [birth year]   DATE OF SERVICE:  06/12/2007                                  CLINIC NOTE   Ms. Lorin Picket is a 60 year old divorced white female who is here for her  annual exam.   PAST MEDICAL HISTORY:  1. Hypertension.  2. Obesity.  3. Migraines.   REVIEW OF SYSTEMS:  She is monogamous for the last 3 years; she lives  with her boyfriend. 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  Her colonoscopy is due.   PAST SURGICAL HISTORY:  Endometrial ablation 2007.   No latex allergies.   ALLERGIES TO MEDICATIONS:  Darvon.   SOCIAL HISTORY:  Is negative tobacco, alcohol, drug use.   FAMILY HISTORY:  Is positive for cervical cancer in her mother who is  deceased from congestive heart failure.  She denies family history of  breast and colon malignancies.   PHYSICAL EXAM:  Weight 194 pounds, height 5 feet, 3 inches, blood  pressure 114/75, pulse 88.  HEENT:  Normal.  HEART:  Regular rate and rhythm.  LUNGS:  Clear sensation bilaterally.  Breast exam normal.  External genitalia normal.  Abdominal exam:  no palpable splenomegaly.  Uterus is upper limits of normal size, mobile, changes consistent with  fibroids.  Adnexa non enlarged. No masses.  No tenderness.   ASSESSMENT/PLAN:  1. Annual exam.  I have checked a Pap smear, recommended self-breast      exam and self vulvar exam monthly.  2. Given a refill on her Prempro 0.45 mg/1.5 mg daily. Due to her 20      pounds weight gain, I am ordering a TSH to be drawn.  She is aware      that weight loss would help prevent future possible morbidities and      mortalities.      [name], MD     MCD/MEDQ  D:  06/12/2007  T:  06/12/2007  Job:  [id number]

## 2010-12-01 NOTE — Assessment & Plan Note (Signed)
[facility]                            CARDIOLOGY OFFICE NOTE   KAREL, MOWERS                      MRN:          [id number]  DATE:11/15/2007                            DOB:          August 24, 1950    INTERVAL HISTORY:  Ms. Katherine [name] is a 60 year old woman with a history of  migraine headaches, hypertension, obesity and noncardiac chest pain with  normal coronary arteries by catheterization in 2007, who returns today  for routine followup.   Overall, she says she is doing fairly well.  She notes that, whenever  she gets her blood pressure checked, it is usually in 140/100, but other  times it is a little bit lower.  She does note that she gets short of  breath with exertion but says she is not very active and relates it a  lot to her weight.  When she is riding on the board or bends over, she  notes that she occasionally gets chest pain.  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   She denies any lower extremity edema or orthopnea.  She does feel  fatigued during the day but denies significant snoring or excessive  daytime sleepiness.   CURRENT MEDICATIONS:  1. Aspirin 81 a day.  2. Atenolol 50 a day.  3. Cymbalta 90 a day.  4. Topamax 75 a day.  5. Concerta 18 a day.   MEDICATION INTOLERANCES:  She is intolerant to diuretics, due to severe  hypokalemia.   PHYSICAL EXAM:  GENERAL:  She is an obese woman in no acute distress,  ambulates around the clinic without respiratory difficulty.  VITAL SIGNS:  Blood pressure is 135/84, heart rate is 100, weight is  196.  HEENT:  Normal.  NECK:  Supple.  No JVD.  Carotids are 2+ bilaterally without bruits.  There is no lymphadenopathy or thyromegaly.  CARDIAC:  She is mildly tachycardiac.  She is regular.  No murmurs, rubs  or gallops.  LUNGS:  Clear.  ABDOMEN:  Obese, nontender, nondistended.  No obvious  hepatosplenomegaly.  No bruits, no masses.  Good bowel  sounds.  EXTREMITIES:  Warm with no cyanosis or clubbing.  There is trace edema.  SKIN:  No rash.  NEURO:  Alert and oriented x3.  Cranial nerves II-XII are intact.  Moves  all four extremities without difficulty.  Affect is pleasant.   ASSESSMENT/PLAN:  1. Hypertension.  Blood pressure is suboptimally controlled.  I      suspect she may have a component of white coat hypertension, I have      asked her to get a blood pressure cuff and keep a blood pressure      log at home.  We will change her atenolol over to Coreg 6.25 b.i.d.  2. Obesity.  I have stressed her the need to be more active and try      and lose weight.  We have set a goal for 1/2 pound a week.  3. Chest pain.  I suspect this is noncardiac.  She has had a previous  normal catheterization.  If it continues, please consider repeating      setting her up for a treadmill Myoview.   DISPOSITION:  Will see her back in 2 months at the [place] office.     Bevelyn Buckles. Bensimhon, MD  Electronically Signed    DRB/MedQ  DD: 11/15/2007  DT: 11/15/2007  Job #: [id number]

## 2010-12-01 NOTE — Assessment & Plan Note (Signed)
NAME:  Katherine [name], Katherine [name]            ACCOUNT NO.:  [id number]   MEDICAL RECORD NO.:  [id number]          PATIENT TYPE:  POB   LOCATION:  [facility] at [facility]         FACILITY:  [facility]   PHYSICIAN:  [name], MD        DATE OF BIRTH:  05-26-1951   DATE OF SERVICE:  07/01/2009                                  CLINIC NOTE   Myeshia is a 60 year old married white lady, who had a pubovaginal sling  ([employer] advantage fit) procedure done for stress  incontinence.  She took her catheter out last night and has been voiding  normally.  She says that she did not have to get up and void during the  night and with Valsalva maneuvers that previously would cause her pants  to be wet, she no longer has stress incontinence and she is very  pleased.  She is not taking any pain medicine and is going back to work  today.  Please note, I did have her keep the catheter in since last week  because during the case the very tiny trocar did perforate the bladder  one-time on her left.  On a postvoid residual today, she has nothing  left in her bladder by ultrasound.  On exam, her incisions are healing  well.  She has the normally expected bruising of her mons.  She also has  osteopenia that was noted on a bone density done January 2010.  She has  tried Fosamax, but had GI discomfort from that.  She had been on Evista,  but even with her insurance she still has to pay 100 dollars a month for  Evista.  She would like to try to find a cheaper alternative.  I have  discussed combination hormone replacement therapy as well as other  bisphosphonate.  She would like to try Boniva and if the Boniva causes  her GI discomfort then we will switch to the combination hormone  replacement therapy with, its incurrent risks.  She will come back for  her annual exam in January 2011 and a mammogram will be scheduled for  that month as well.      [name], MD     MCD/MEDQ  D:  07/01/2009  T:  07/02/2009   Job:  [id number]

## 2010-12-01 NOTE — Assessment & Plan Note (Signed)
NAME:  Katherine [name], Katherine [name]            ACCOUNT NO.:  [id number]   MEDICAL RECORD NO.:  [id number]          PATIENT TYPE:  POB   LOCATION:  [facility] at [facility]         FACILITY:  [facility]   PHYSICIAN:  [name], MD        DATE OF BIRTH:  [birth year]   DATE OF SERVICE:  02/19/2010                                  CLINIC NOTE   Ms. Corliss is a 60 year old married white G1, P1 who comes in here for  annual exam.  She has no GYN complaints.  Her only review of systems  complaint is that of chronic constipation.   PAST MEDICAL HISTORY:  Osteopenia, high cholesterol, migraines, obesity,  and hypertension.   MEDICATIONS:  She takes;  1. Zocor 25 mg a day.  2. Cymbalta 120 mg daily.  3. Zyrtec daily.  4. Calcium 1200 mg daily that is with vitamin D.  5. Spironolactone 25 mg b.i.d.  6. Topamax 75 mg daily.  7. Baby aspirin daily.  8. Abilify 2 mg daily.   REVIEW OF SYSTEMS:  She is monogamous for 4 years.  She got married last  year.  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  As mentioned above, she has chronic  constipation.   PAST SURGICAL HISTORY:  Endometrial ablation in 2007, a cesarean  section.  She also had a sling which has relieved all of her urinary  problems.   FAMILY HISTORY:  Negative for breast and colon cancer, but her mother  died from CHF was also having cervical cancer.   ALLERGIES:  No latex allergies.  Her drug allergy is DARVON.   PHYSICAL EXAMINATION:  GENERAL:  Well nourished, well hydrated very  pleasant white female.  VITAL SIGNS:  Height 5 feet 3 inches, weight 191, blood pressure 97/73,  pulse 94.  HEENT:  Normal.  BREASTS:  Normal bilaterally.  HEART:  Regular rate and rhythm.  LUNGS:  Clear to auscultation bilaterally.  ABDOMEN:  Obese, benign.  PELVIC:  External genitalia normal.  Moderate atrophy.  Cervix;  nulliparous, normal.  Uterus is now 6 to 8 week size and lumpy  consistent with fibroids.  (Please  compare that to last year's exam  which was normal size and shape and anteverted).  Her adnexa are  nontender.  No masses.   ASSESSMENT AND PLAN:  1. Annual exam.  I have checked Pap smear.  Her mammogram is up-to-      date.  Recommended self-breast and self-vulvar exams.  2. Uterine enlargement since last year.  I am ordering a GYN      ultrasound.  3. Osteopenia.  The patient has been tried on 2 different      bisphosphonates and continues to have GI upset with those.  She      says that Evista is too expensive, and therefore I have recommended      that she start some weightbearing exercise and continue with her      calcium and vitamin D.      [name], MD     MCD/MEDQ  D:  02/19/2010  T:  02/20/2010  Job:  [id number]

## 2010-12-01 NOTE — Assessment & Plan Note (Signed)
[facility]                            CARDIOLOGY OFFICE NOTE   Katherine [name], Katherine [name]                      MRN:          [id number]  DATE:06/07/2007                            DOB:          1951/06/23    This is a patient of Dr. Prescott [name].  This is a 60 year old white  female who comes in today for a blood pressure check.  She recently was  at the migraine clinic for a regular checkup, and her blood pressure was  up to 130/80 or 90, and she was told by them to come here for a  followup.  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  Overall, she has been on atenolol for years.  At one point  she was on hydrochlorothiazide combination as well as Lasix 20 mg every  other day at one point, but developed hypokalemia on the combination  drug.  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   The patient denies any cardiac complaints, and, overall, says she has  been doing well.   CURRENT MEDICATIONS:  1. Cymbalta 30 mg 3 daily.  2. Topamax 25 mg 3 daily.  3. Aspirin 81 mg daily.  4. Abilify 5 mg daily.  5. Atenolol 50 mg daily.  6. Concerta 18 mg daily.   PHYSICAL EXAMINATION:  This is a pleasant, overweight, 60 year old white  female in no acute distress.  Blood pressure 110/80, pulse is 90, weight 196.  NECK:  Without JVD, HDR, bruit or thyroid enlargement.  LUNGS:  Clear anterior, posterior and lateral.  HEART:  Regular rate and rhythm at 90 beats per minute.  Normal S1 and  S2, no murmur, rub, bruit, thrill, or heave noted.  ABDOMEN:  Soft without organomegaly, masses, lesions, or abnormal  tenderness.  EXTREMITIES:  Without cyanosis, clubbing, or edema.  She has good distal  pulses.   EKG normal sinus rhythm at 90 beats per minute.   IMPRESSION:  1. Hypertension, recently elevated at migraine clinic, normal today.  2. History of atypical chest pain with normal coronary arteries on      catheterization in April 2007.  3. History of migraine headaches.  4. History of depression.  5. History of hypokalemia while on Tenoretic.   PLAN:  Because the patient is a little tachycardic, I have opted to  increase her atenolol to 50 in the mornings, 25 in the evening.  I have  asked her to get a blood pressure cuff, and check her blood pressures  twice a day and call if they remain elevated or drop too much on the  increased dose.  Weight loss and exercise as well as low-sodium diet  would also help this patient.  I have told her  she can follow up with  Dr. Vear [name] as far as her blood pressure is concerned.  We will check a  BMET and TSH today, and she can see Dr. Gala [name] back in 4 months if  needed.      Katherine Reedy, PA-C  Electronically Signed      Katherine [name]. Katherine Chance, MD, [facility]  Electronically Signed   ML/MedQ  DD: 06/07/2007  DT: 06/07/2007  Job #: [id number]

## 2010-12-01 NOTE — Assessment & Plan Note (Signed)
[facility] OFFICE NOTE   ENSLEY, BLAS                      MRN:          [id number]  DATE:01/10/2008                            DOB:          [birth year]    INTERVAL HISTORY:  Ms. Katherine [name] is a very pleasant 60 year old woman with a  history of migraine headaches, hypertension, obesity, and noncardiac  chest pain with normal coronary arteries by catheterization in 2007.  She returns for routine followup.  At her last visit, we switched her  atenolol over to Coreg to try in helping with her blood pressure.  Unfortunately, she says her blood pressure has continued to be elevated.  She has bought a good cuff and checked it multiple times.  Typical blood  pressures have been around 150/90.  She denies any headaches or focal  neurologic signs, but she does say she gets ringing in her ears and  feels that is related to her high blood pressure.  She does have  occasional mild lower-extremity edema but no orthopnea or PND.  She has  not had any more chest pain.  She is trying to be a little bit better  about exercising and recently started biking outside.  She is also  trying to watch her salt.  Current medications are:  1. Aspirin 81 a day.  2. Cymbalta 90 a day.  3. Topamax 75 a day.  4. Concerta 18 mg a day.  5. Coreg 6.25 b.i.d.  6. Abilify 1 mg daily.   PHYSICAL EXAMINATION:  She is in no acute distress.  She ambulates  around the clinic without respiratory difficulty.  Blood pressure is  148/98, heart rate is 94.  Her weight is 194.  HEENT:  Normal.  NECK:  Supple.  No JVD, carotids are 2+ bilaterally without bruits.  There is no lymphadenopathy or thyromegaly.  CARDIAC:  PMI is nondisplaced.  She has a regular rate and rhythm.  No  murmurs, rubs, or gallops.  LUNGS:  Clear.  ABDOMEN:  Obese, nontender, nondistended.  No hepatosplenomegaly.  No  bruits, no masses.  Good bowel sounds.  EXTREMITIES:  Warm  with no  cyanosis, clubbing, or edema.  There is no rash.  NEURO:  Alert and oriented x3.  Cranial nerves II-XII are intact.  Moves  all 4 extremities without difficulty.  Affect is pleasant.   EKG shows normal sinus rhythm at a rate of 94, no ST-T wave  abnormalities.  Normal axis and intervals.   ASSESSMENT:  Hypertension.  This is poorly controlled.  We will increase  her Coreg to 12.5 b.i.d. and also start her on chlorthalidone 25 mg a  day to help with her blood pressure and occasional edema.  We will start  her on potassium 20 mEq a day to go along with it.  She will keep a  blood pressure log for [place].  We will check a BMET in 1 week and see her  back in 4-6  weeks to continue to titrate her antihypertensive regimen.  I told her  if her blood pressure remains markedly  elevated she should contact us  sooner.     Bevelyn Buckles. Bensimhon, MD  Electronically Signed    DRB/MedQ  DD: 01/10/2008  DT: 01/11/2008  Job #: [id number]

## 2010-12-04 NOTE — Cardiovascular Report (Signed)
NAMEAIJALON, DEMURO               ACCOUNT NO.:  [id number]   MEDICAL RECORD NO.:  [id number]          PATIENT TYPE:  OIB   LOCATION:  1965                         FACILITY:  [facility]   PHYSICIAN:  Arvilla Meres, M.D. LHCDATE OF BIRTH:  26-Nov-1950   DATE OF PROCEDURE:  11/12/2005  DATE OF DISCHARGE:                              CARDIAC CATHETERIZATION   PRIMARY CARE PHYSICIAN:  Dr. Loma Sender.   CARDIOLOGIST:  Dr. Gala Romney   PATIENT IDENTIFICATION:  Katherine [name] is a 60 year old woman with a history of  hypertension who is followed by Dr. Loma Sender.  She recently had  noted some chest discomfort associated with a marked decrease in her  exercise tolerance.  She was referred for a treadmill exercise test.  During  exercise test, she exercised for 6 minutes and 15 seconds without any chest  pain. However,  EKG showed 1 to 2 mm of ST depression in multiple leads.  The possibility of proceeding with repeat stress testing with nuclear  imaging versus cardiac catheterization were discussed with her, and she was  quite eager to pursue catheterization.  This was performed in the outpatient  catheterization lab.   PROCEDURES PERFORMED:  1.  Selective coronary angiography.  2.  Left heart catheterization.  3.  Left ventriculogram.   DESCRIPTION OF PROCEDURE:  The risks and benefits of the procedure were  explained, consent was signed and placed on the chart.  A 4-French arterial  sheath was placed in right femoral artery using a modified Seldinger  technique.  Standard catheters including JL-4 and an angled pigtail were  used for the procedure.  There was no apparent complications.  All catheter  exchanges made over a wire.   Central aortic pressure 104/64 with a mean of 82.  LV pressure 115/5 and  LVEDP of 16.  There is no aortic stenosis.   Left main was normal.   LAD was a long vessel coursing the apex.  Gave off a moderate-sized  branching diagonal.  Off this diagonal  was a very tortuous anomalous vessel  which gave off the right coronary artery.  There is no angiographic CAD.   Left circumflex was a moderate size vessel made up primarily of a large OM1.  There was small OM-2.  There was no angiographic CAD.   Right coronary artery.  This appeared to arise from an anomalous vessel from  the LAD diagonal.  We were unable to cannulate an ostium off the central  aorta.The RCA itself was a moderate size vessel that gave off a moderate  PDA, a small PL and a moderate-sized PL.  There was no angiographic CAD.   Left ventriculogram done in the RAO position showed an EF of 65% with a  questionable mild mitral valve prolapse.  There was no significant mitral  regurgitation or wall motion abnormalities.   ASSESSMENT:  1.  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  There is no angiographic atherosclerotic disease.  2.  Normal LV function with question of mild mitral valve prolapse but no  significant mitral regurgitation.   PLAN/DISCUSSION:  I suspect her chest discomfort is noncardiac.  She will  need continued risk factor management.    I have reviewed the films with colleagues and it appears the patient likely  has a subtotal occlusion of the ostial RCA with a collarteral from the LAD  via the conus branch. Although the collateral is quite large, if the patient  has documented inferior ischemia woth continuing chest pain could consider  PCI of the RCA via a retrograde approach through the collateral.      Arvilla Meres, M.D. [facility]  Electronically Signed     DB/MEDQ  D:  11/12/2005  T:  11/13/2005  Job:  [id number]   cc:   Loma Sender  Fax: [phone]

## 2010-12-04 NOTE — Assessment & Plan Note (Signed)
[facility]                             PRIMARY CARE OFFICE NOTE   Katherine [name], Katherine [name]                      MRN:          [id number]  DATE:03/14/2006                            DOB:          1950-08-21    HISTORY AND PHYSICAL:   CHIEF COMPLAINT:  See our new patient to practice/hypokalemia.   HISTORY OF PRESENT ILLNESS:  Patient is a 60 year old white female referred  to [place] by Dr. Tenny Craw of [facility].  She has been evaluated in the  past with a history of hypertension and chest discomfort.  Patient's family  history was positive for congestive heart failure and on November 12, 2005  underwent a cardiac catheterization by Dr. Gala Romney.  Patient was found to  have normal coronary arteries with anomalous coronary arising from mid-  diagonal.  No angiographic atherosclerotic disease.  Normal LV function with  question of mild mitral valve prolapse but no significant mitral  regurgitation.  Patient also followed for hypokalemia.  She was previously  on Tenoretic 25/50 despite stopping her diuretic she has had continued  issues with hypokalemia.  She had also been taking Furosemide every other  day which also has been discontinued.  Dr. Tenny Craw upon chart review had  checked the serum aldosterone levels, unclear what patient's volume status  at that time but her aldosterone ratio were within normal limits.   She denies any other over-the-counter medication use.  Is not taking any  other diuretics or laxative.  She does report constipation however.   She is also followed by a psychiatrist Dr. Darlys Gales regarding depression,  possible bipolar features.  She has been very well controlled on Cymbalta  and recently started Abilify 5 mg once a day.  She has had significant  weight  gain over a one year time period.  Some of the weight gain was  before she started the Abilify but she states 30-pound weight gain over the  last one year time period.  Lastly  she has a history of migraine headache  for which she take Topamax as a prophylactic.   PAST MEDICAL HISTORY:   SUMMARY:  1. Hypertension.  2. Migraine headache.  3. Depression.  4. Atypical chest pain.  5. Status post tonsillectomy in 1972.  6. Status post C-section in 1984.  7. Status post switch surgery x2 in 1992 and 1993.  8. Status post hysteroscopy with endometrial ablation in September 2006      with Dr. Mia Creek.   CURRENT MEDICATIONS:  1. Cymbalta 30 mg two tablets a day.  2. Topamax 25 mg three tablets a day.  3. Aspirin 81 mg once a day.  4. Protonix 40 mg once a day.  5. Abilify 5 mg once a day.  6. Potassium/Chloride 20 mEq one per day.  7. Atenolol 50 mg one per day.   ALLERGIES:  Allergies to medication include DARVON.   SOCIAL HISTORY:  Patient is divorced.  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  She has a 77 -year-old son who is currently attending  college, temporarily  living with her.   FAMILY HISTORY:  Mother deceased secondary to complications of congestive  heart failure.  Also had a history of type 2 diabetes.  Father deceased with  liver disease unknown etiology, diabetes.  Patient's brother is noted to  have hypertension and also congestive heart failure.  Denies any family  history of cancer.   HABITS:  She seldom drinks.  Denies any tobacco use.   PREVENTATIVE CARE HISTORY:  Her last pap smear was one year ago.  Her last  mammogram was 1-1/2 years ago.  Her last tetanus shot was in year 2000.   REVIEW OF SYSTEMS:  No fevers, chills, no HEENT symptoms.  Patient is  currently asymptomatic from a cardiac standpoint.  No palpitations, no  cough, no shortness of breath.  Patient has occasional heartburn, denies any  nausea, vomiting, constipation, no dysuria but notes some polyuria, some  easy bruising, no abdominal stria, no shoulder weakness.   PHYSICAL EXAMINATION:  VITAL SIGNS:  Height is 5 foot 3 inches.  Weight is  186  pounds.  Temperature is 97.6.  Pulse is 82.  Blood pressure is 109/67 in  a seated position.  GENERAL:  In general the patient is a pleasant somewhat overweight 55-year-  old white female who appears her stated age.  HEENT:  Normocephalic, atraumatic.  Pupils equal, reactive to light  bilaterally.  Extra ocular muscles was intact.  Patient was anicteric.  Conjunctivae was within normal limits.  External auditory canals are  inspected which were clear bilaterally.  __________ was grossly normal.  I  could not appreciate any moon-face, no buffalo hump.  NECK:  Supple.  No adenopathy, carotid bruit or thyromegaly.  CHEST:  Normal respiratory effort.  Clear to auscultation bilaterally.  No  rales, rhonchi or wheezing.  CARDIOVASCULAR:  Regular rate and rhythm.  No murmurs, rubs or gallops  appreciated.  ABDOMEN:  Slight protuberant, non-tender, positive bowel sounds, no  organomegaly.  MUSCULOSKELETAL:  No clubbing, cyanosis or edema.  SKIN:  Warm and dry.  NEUROLOGICAL:  Cranial nerves II through XII was grossly intact.  She was  non-focal.   IMPRESSION/RECOMMENDATIONS:  1. History of atypical chest pain with normal coronary arteries.  2. Depression with possible bipolar features.  3. Hypertension.  4. Hypokalemia, unclear etiology.  5. Health maintenance.   RECOMMENDATIONS:  1. Not strongly suspicious that patient has any symptoms of hypercortisol      state, however she will be sent for a dexamethasone suppression test      and check a serum cortisol in a.m. after 1 mg of dexamethasone at 11      p.m. We also checked thyroid studies as well as urine and a serum      protein electrophoresis to rule out that she has any significant chains      precipitating hypokalemia.  If above workup is normal and if patient      still has issues with hypokalemia we will consider checking 24-hour     urine and referral for a nephrology evaluation.  I doubt that she has      any salt wasting  nephropathy.  I doubt Gitelman's  Disease.  2. She does have some history of abnormal glucose.  Patient at higher risk      due to family history.  Discussed weight loss goals.  Patient may be      placed on Metformin if hemoglobin A1C is above 6.0.  3. Her blood pressure at this  time is well controlled.  She denies any      current diuretic use.  Patient is to follow with her psychiatrist on      Abilify and reported to have less weight gain affects, however, this      may be contributing to her weight gain and increased appetite.  Patient      is very reluctant to consider discontinuing medication.  Followup time      will be in approximately two weeks.                                   Barbette Hair. Artist Pais, DO   RDY/MedQ  DD:  03/14/2006  DT:  03/15/2006  Job #:  [id number]   cc:   Bevelyn Buckles. Bensimhon, MD  C. Duane Lope, MD

## 2010-12-04 NOTE — Op Note (Signed)
NAMEKRISA, Katherine [name]               ACCOUNT NO.:  [id number]   MEDICAL RECORD NO.:  [id number]          PATIENT TYPE:  AMB   LOCATION:  [facility]                           FACILITY:  [facility]   PHYSICIAN:  Ginger Carne, MD  DATE OF BIRTH:  1950-12-02   DATE OF PROCEDURE:  03/26/2005  DATE OF DISCHARGE:                                 OPERATIVE REPORT   PREOPERATIVE DIAGNOSIS:  Menorrhagia.   POSTOPERATIVE DIAGNOSIS:  Menorrhagia.   PROCEDURE:  Hysteroscopy with NovaSure endometrial ablation.   SURGEON:  Ginger Carne, M.D.   ASSISTANT:  None.   COMPLICATIONS:  None immediate.   ESTIMATED BLOOD LOSS:  Minimal.   ANESTHESIA:  MAC instead of general.   SPECIMEN:  None.   OPERATIVE FINDINGS:  Uterus sounded to 8 cm with a 4 cm cervical length.  The endometrial cavity revealed no evidence of polyps, fibroids or other  intracavitary lesions.  Both ostia, anterior and posterior lateral walls  identified.  The endocervical canal was normal.  NovaSure measurements  included a cavity width with 2.8, power was 62, and the time was 1 minute 30  seconds.   OPERATIVE PROCEDURE:  The patient prepped and draped in the usual fashion  and placed in lithotomy position.  Betadine solution used for antiseptic and  the patient was catheterized prior to procedure, after which a tenaculum  placed on the anterior lip of the cervix, dilatation to accommodate a  hysteroscope, followed by NovaSure endometrial ablation was performed.  At  the end of the procedure, no active bleeding noted.  The NovaSure has a timed pressure check, which was passed.  The patient  returned to the postanesthesia recovery room in excellent condition.      Ginger Carne, MD  Electronically Signed     SHB/MEDQ  D:  03/26/2005  T:  03/26/2005  Job:  [id number]

## 2010-12-04 NOTE — H&P (Signed)
NAMELARIN, DEPAOLI               ACCOUNT NO.:  [id number]   MEDICAL RECORD NO.:  [id number]          PATIENT TYPE:  AMB   LOCATION:  [facility]                           FACILITY:  [facility]   PHYSICIAN:  Ginger Carne, MD  DATE OF BIRTH:  Apr 18, 1951   DATE OF ADMISSION:  03/26/2005  DATE OF DISCHARGE:                                HISTORY & PHYSICAL   REASON FOR HOSPITALIZATION:  Menorrhagia.   IN-HOSPITAL PROCEDURES:  Hysteroscopy with NovaSure endometrial ablation.   HISTORY OF PRESENT ILLNESS:  This patient is a 60 year old, gravida 1, para  1-0-0-1, Caucasian female admitted for the aforementioned procedure because  of a long-standing history, over several years, of heavy vaginal bleeding.  She bleeds approximately 6 days for her menstrual cycle proper, followed by  2-3 weeks of spotting.  She denies dysmenorrhea.  She has had 2 transvaginal  ultrasounds and 3 endometrial biopsies performed at another office, which  demonstrated disordered proliferative endometrium.  An SIS performed in this  office demonstrated no evidence of intracavitary lesion.  The patient has  been managed with Prometrium with minimal benefit.  She is hypertensive and  does not qualify for oral contraceptive management.  Of late, the patient  has noted worsening of her bleeding, and wishes to have definitive  management for same.  The patient's thyroid screening was normal.  Her  hemoglobin was 14.1.  She takes no medications to enhance her bleeding  propensity, and has no personal or family history of bleeding diatheses.   OBSTETRIC AND GYNECOLOGIC HISTORY:  The patient has had cesarean section in  1984.  She is not sexually active.   MEDICAL HISTORY:  1.  The patient has hypertension.  2.  Migraine headaches.  3.  History of depression.   SURGICAL HISTORY:  Per above.   MEDICATIONS:  1.  Tenoretic 50, one in the morning.  2.  Lexapro 10 mg daily.  3.  Klonopin 0.5 mg up to 3 times a day as needed.  4.  Ambien 5 mg to be taken as needed.  5.  Imitrex 6 mg subcutaneous as needed.   ALLERGIES:  PROPOXYPHENE.   SOCIAL HISTORY:  Negative for smoking, drug, or alcohol abuse.   FAMILY HISTORY:  Both parents have type 2 diabetes and hypertension.  Her  mother has had a history of congestive heart failure and cervical cancer.  Both parents also suffer from depression.   REVIEW OF SYSTEMS:  Per above.   PHYSICAL EXAMINATION:  VITAL SIGNS:  Blood pressure is 150/100, weight 168  pounds, height 5 feet, 3 inches.  HEENT:  Grossly normal.  BREAST EXAM:  Without mass, discharge, thickenings, or tenderness.  CHEST:  Clear to percussion and auscultation.  CARDIOVASCULAR:  Without murmurs or enlargement.  Regular rate and rhythm.  EXTREMITIES/LYMPHATICS/SKIN/NEUROLOGICAL/MUSCULOSKELETAL SYSTEMS:  All  within normal limits.  ABDOMEN:  Soft without gross hepatosplenomegaly.  PELVIC:  External genitalia, vulva, and vagina normal.  Cervix smooth  without erosions or lesions.  Uterus is upper limits of normal size, 6  weeks.  Both adnexa are palpable and found to be  normal.  RECTAL:  Hemoccult negative without masses.   IMPRESSION:  Menometrorrhagia.   PLAN:  Hysteroscopy followed by NovaSure endometrial ablation.  The nature  of said procedure discussed in detail.  The patient understands there is a  50% amenorrhea rate and 50% bleeding rate which may vary from being  acceptable to unacceptable for the patient.  At this time, the patient's  schedule does not permit definitive surgical management in the form of the  total vaginal hysterectomy.  The patient understands that she may have  continued bleeding problems which may necessitate further surgery.  Risks  including possible perforation, continued bleeding to the dissatisfaction of  said patient, infection, pulmonary complications were discussed and  understood by said patient.  She understands that she will have to use  contraception in  the event that she becomes sexually active.  At this time,  the patient is satisfied with her decision.  All questions answered to the  satisfaction of said patient, and all instructions understood.      Ginger Carne, MD  Electronically Signed     SHB/MEDQ  D:  03/24/2005  T:  03/24/2005  Job:  [id number]

## 2010-12-17 ENCOUNTER — Other Ambulatory Visit: Payer: Self-pay | Admitting: Emergency Medicine

## 2010-12-17 MED ORDER — CARVEDILOL 12.5 MG PO TABS: 12.5000 mg | ORAL_TABLET | Freq: Two times a day (BID) | ORAL | Status: DC

## 2010-12-30 ENCOUNTER — Other Ambulatory Visit: Payer: Self-pay | Admitting: *Deleted

## 2010-12-30 MED ORDER — SIMVASTATIN 20 MG PO TABS: 20.0000 mg | ORAL_TABLET | Freq: Every evening | ORAL | Status: DC

## 2010-12-31 ENCOUNTER — Encounter: Payer: Self-pay | Admitting: Internal Medicine

## 2011-01-05 ENCOUNTER — Ambulatory Visit: Payer: Self-pay | Admitting: Internal Medicine

## 2011-01-06 ENCOUNTER — Ambulatory Visit: Payer: Self-pay | Admitting: Internal Medicine

## 2011-01-07 ENCOUNTER — Encounter: Payer: Self-pay | Admitting: Cardiovascular Disease

## 2011-03-03 ENCOUNTER — Ambulatory Visit: Payer: Self-pay | Admitting: Internal Medicine

## 2011-03-05 ENCOUNTER — Ambulatory Visit (INDEPENDENT_AMBULATORY_CARE_PROVIDER_SITE_OTHER): Payer: BC Managed Care – PPO | Admitting: *Deleted

## 2011-03-05 DIAGNOSIS — E785 Hyperlipidemia, unspecified: Secondary | ICD-10-CM

## 2011-03-05 DIAGNOSIS — E876 Hypokalemia: Secondary | ICD-10-CM

## 2011-03-05 DIAGNOSIS — I10 Essential (primary) hypertension: Secondary | ICD-10-CM

## 2011-03-06 LAB — BASIC METABOLIC PANEL
CO2: 20 mEq/L (ref 19–32)
Calcium: 9.1 mg/dL (ref 8.4–10.5)
Chloride: 108 mEq/L (ref 96–112)
Potassium: 4.2 mEq/L (ref 3.5–5.3)
Sodium: 140 mEq/L (ref 135–145)

## 2011-03-06 LAB — HEPATIC FUNCTION PANEL
AST: 16 U/L (ref 0–37)
Albumin: 4.1 g/dL (ref 3.5–5.2)
Total Bilirubin: 0.3 mg/dL (ref 0.3–1.2)

## 2011-03-06 LAB — LIPID PANEL
HDL: 43 mg/dL (ref >39)
Total CHOL/HDL Ratio: 3 Ratio
VLDL: 28 mg/dL (ref 0–40)

## 2011-03-07 ENCOUNTER — Encounter: Payer: Self-pay | Admitting: Cardiovascular Disease

## 2011-03-08 ENCOUNTER — Ambulatory Visit (INDEPENDENT_AMBULATORY_CARE_PROVIDER_SITE_OTHER): Payer: BC Managed Care – PPO | Admitting: Obstetrics & Gynecology

## 2011-03-08 ENCOUNTER — Encounter: Payer: Self-pay | Admitting: Obstetrics & Gynecology

## 2011-03-08 VITALS — BP 103/78 | HR 92 | Ht 63.0 in | Wt 194.0 lb

## 2011-03-08 DIAGNOSIS — M8580 Other specified disorders of bone density and structure, unspecified site: Secondary | ICD-10-CM

## 2011-03-08 DIAGNOSIS — R61 Generalized hyperhidrosis: Secondary | ICD-10-CM

## 2011-03-08 DIAGNOSIS — Z01419 Encounter for gynecological examination (general) (routine) without abnormal findings: Secondary | ICD-10-CM

## 2011-03-08 DIAGNOSIS — Z1272 Encounter for screening for malignant neoplasm of vagina: Secondary | ICD-10-CM

## 2011-03-08 DIAGNOSIS — M899 Disorder of bone, unspecified: Secondary | ICD-10-CM

## 2011-03-08 DIAGNOSIS — M949 Disorder of cartilage, unspecified: Secondary | ICD-10-CM

## 2011-03-08 DIAGNOSIS — Z0000 Encounter for general adult medical examination without abnormal findings: Secondary | ICD-10-CM

## 2011-03-08 LAB — TSH: TSH: 1.782 u[IU]/mL (ref 0.350–4.500)

## 2011-03-08 MED ORDER — RALOXIFENE HCL 60 MG PO TABS: 60.0000 mg | ORAL_TABLET | Freq: Every day | ORAL | Status: AC

## 2011-03-08 NOTE — Progress Notes (Signed)
  Subjective:    Patient ID: Katherine [name], female    DOB: 1950-10-17, 60 y.o.   MRN: [id number]  HPI    Review of Systems     Objective:   Physical Exam        Assessment & Plan:   Subjective:    Katherine [name] is a 60 y.o. female who presents for annual exam. She complains of feeling "hot" all the time.  She is adament that these are not hot flashes. The patient is sexually active. GYN screening history: last pap: was normal. The patient is not taking hormone replacement therapy. Patient denies post-menopausal vaginal bleeding.. The patient wears seatbelts: yes. The patient participates in regular exercise: no. Has the patient ever been transfused or tattooed?: no. The patient reports that there is not domestic violence in her life.   Menstrual History: OB History    Grav Para Term Preterm Abortions TAB SAB Ect Mult Living                  Menarche age:  No LMP recorded. Patient is postmenopausal.     Review of Systems A comprehensive review of systems was negative. She is very happy with her sling.   Objective:    BP 103/78  Pulse 92  Ht 5\' 3"  (1.6 m)  Wt 194 lb (87.998 kg)  BMI 34.37 kg/m2  General Appearance:    Alert, cooperative, no distress, appears stated age  Head:    Normocephalic, without obvious abnormality, atraumatic  Eyes:    PERRL, conjunctiva/corneas clear, EOM's intact, fundi    benign, both eyes  Ears:    Normal TM's and external ear canals, both ears  Nose:   Nares normal, septum midline, mucosa normal, no drainage    or sinus tenderness  Throat:   Lips, mucosa, and tongue normal; teeth and gums normal  Neck:   Supple, symmetrical, trachea midline, no adenopathy;    thyroid:  no enlargement/tenderness/nodules; no carotid   bruit or JVD  Back:     Symmetric, no curvature, ROM normal, no CVA tenderness  Lungs:     Clear to auscultation bilaterally, respirations unlabored  Chest Wall:    No tenderness or deformity   Heart:    Regular  rate and rhythm, S1 and S2 normal, no murmur, rub   or gallop  Breast Exam:    No tenderness, masses, or nipple abnormality  Abdomen:     Soft, non-tender, bowel sounds active all four quadrants,    no masses, no organomegaly  Genitalia:    Normal female without lesion, discharge or tenderness, NSSA uterus and non-enlarged adnexa  Rectal:    N/A  Extremities:   Extremities normal, atraumatic, no cyanosis or edema  Pulses:   2+ and symmetric all extremities  Skin:   Skin color, texture, turgor normal, no rashes or lesions  Lymph nodes:   Cervical, supraclavicular, and axillary nodes normal  Neurologic:   CNII-XII intact, normal strength, sensation and reflexes    throughout      Assessment:    Normal gyn exam  Obesity Feeling of "hot" Osteopenia   Plan:    Thin prep Pap smear.  Mammogram TSH Recommend weight loss and weight-bearing exercise Evista 60 mg daily or QOD (for financial reasons)

## 2011-03-23 ENCOUNTER — Encounter: Payer: Self-pay | Admitting: Cardiovascular Disease

## 2011-03-24 ENCOUNTER — Institutional Professional Consult (permissible substitution): Payer: Self-pay | Admitting: Cardiovascular Disease

## 2011-05-07 ENCOUNTER — Encounter: Payer: Self-pay | Admitting: Cardiovascular Disease

## 2011-05-19 ENCOUNTER — Institutional Professional Consult (permissible substitution): Payer: Self-pay | Admitting: Cardiovascular Disease

## 2011-06-04 ENCOUNTER — Other Ambulatory Visit (INDEPENDENT_AMBULATORY_CARE_PROVIDER_SITE_OTHER): Payer: BC Managed Care – PPO | Admitting: *Deleted

## 2011-06-04 DIAGNOSIS — N390 Urinary tract infection, site not specified: Secondary | ICD-10-CM

## 2011-06-04 LAB — POCT URINALYSIS DIPSTICK
Bilirubin, UA: NEGATIVE
Ketones, UA: NEGATIVE

## 2011-06-04 MED ORDER — AMOXICILLIN 500 MG PO CAPS: 500.0000 mg | ORAL_CAPSULE | Freq: Three times a day (TID) | ORAL | Status: AC

## 2011-06-04 MED ORDER — PHENAZOPYRIDINE HCL 200 MG PO TABS: 200.0000 mg | ORAL_TABLET | Freq: Three times a day (TID) | ORAL | Status: DC | PRN

## 2011-06-04 NOTE — Progress Notes (Signed)
Patient complains of gradually worsening pain and pressure with urination as well as increased frequency.

## 2011-06-07 LAB — CULTURE, URINE COMPREHENSIVE

## 2011-06-14 ENCOUNTER — Telehealth: Payer: Self-pay | Admitting: *Deleted

## 2011-06-14 DIAGNOSIS — N390 Urinary tract infection, site not specified: Secondary | ICD-10-CM

## 2011-06-14 MED ORDER — SULFAMETHOXAZOLE-TRIMETHOPRIM 800-160 MG PO TABS: 1.0000 | ORAL_TABLET | Freq: Two times a day (BID) | ORAL | Status: AC

## 2011-06-14 NOTE — Telephone Encounter (Signed)
Patient is still having pressure and increase in urination.  She would like to try a different antibiotic.  I have called an order for Bactrim DS for 5 days for patient.  She will make appointment if her symptoms continue or worsen.

## 2011-06-21 ENCOUNTER — Encounter: Payer: Self-pay | Admitting: Cardiovascular Disease

## 2011-06-22 ENCOUNTER — Institutional Professional Consult (permissible substitution): Payer: Self-pay | Admitting: Cardiovascular Disease

## 2011-07-21 ENCOUNTER — Ambulatory Visit (INDEPENDENT_AMBULATORY_CARE_PROVIDER_SITE_OTHER): Payer: BC Managed Care – PPO | Admitting: Cardiovascular Disease

## 2011-07-21 ENCOUNTER — Encounter: Payer: Self-pay | Admitting: Cardiovascular Disease

## 2011-07-21 VITALS — BP 110/78 | HR 91 | Ht 63.0 in | Wt 191.5 lb

## 2011-07-21 DIAGNOSIS — E785 Hyperlipidemia, unspecified: Secondary | ICD-10-CM

## 2011-07-21 DIAGNOSIS — R0602 Shortness of breath: Secondary | ICD-10-CM

## 2011-07-21 DIAGNOSIS — E669 Obesity, unspecified: Secondary | ICD-10-CM

## 2011-07-21 DIAGNOSIS — I10 Essential (primary) hypertension: Secondary | ICD-10-CM

## 2011-07-21 MED ORDER — SPIRONOLACTONE 25 MG PO TABS: 25.0000 mg | ORAL_TABLET | Freq: Two times a day (BID) | ORAL | Status: DC

## 2011-07-21 MED ORDER — SIMVASTATIN 20 MG PO TABS: 20.0000 mg | ORAL_TABLET | Freq: Every evening | ORAL | Status: DC

## 2011-07-21 MED ORDER — CARVEDILOL 12.5 MG PO TABS: 12.5000 mg | ORAL_TABLET | Freq: Two times a day (BID) | ORAL | Status: DC

## 2011-07-21 NOTE — Patient Instructions (Addendum)
You are doing well. No medication changes were made.  Please call us if you have new issues that need to be addressed before your next appt.  Your physician wants you to follow-up in: 12 months.  You will receive a reminder letter in the mail two months in advance. If you don't receive a letter, please call our office to schedule the follow-up appointment.  Phone number for Dr. Erline Hau is : [phone]

## 2011-07-21 NOTE — Assessment & Plan Note (Signed)
Shortness of breath with heavy exertion is likely secondary to deconditioning and her weight. We have encouraged her to start a regular walking program.

## 2011-07-21 NOTE — Progress Notes (Signed)
Patient ID: Katherine [name], female    DOB: 08-04-1950, 61 y.o.   MRN: [id number]  HPI Comments: Katherine [name] is a 61 y/o woman with a h/o CP with no CAD on cath 2007, HTN and obesity. She returns today for routine follow-up.     Previous echo  showed EF 65% no signifcant valvular disease.  Possible OSA by hx. She reports having significant nasal stuffiness over the past several months. Denies any postnasal drip. She has difficulty sleeping sometimes.    Chronic dyspnea. Very stressed out at work. Not exercising. No edema, PND or orthopnea. Not following BP closely. Watches her diet closley and avoids fatty foods.   Cholesterol is significantly improved on low dose simvastatin. EKG shows normal sinus rhythm with rate 91 beats per minute with no significant ST or T wave changes      Outpatient Encounter Prescriptions as of 07/21/2011  Medication Sig Dispense Refill  . ARIPiprazole (ABILIFY) 2 MG tablet Take 2 mg by mouth daily.        Marland Kitchen aspirin 81 MG tablet Take 81 mg by mouth daily.        . carvedilol (COREG) 12.5 MG tablet Take 1 tablet (12.5 mg total) by mouth 2 (two) times daily.  180 tablet  3  . cholecalciferol (VITAMIN D) 1000 UNITS tablet Take 1,000 Units by mouth daily.        . DULoxetine (CYMBALTA) 30 MG capsule Take 90 mg by mouth daily.        . methylphenidate (CONCERTA) 18 MG CR tablet Take 18 mg by mouth daily.        . simvastatin (ZOCOR) 20 MG tablet Take 1 tablet (20 mg total) by mouth every evening.  90 tablet  3  . spironolactone (ALDACTONE) 25 MG tablet Take 1 tablet (25 mg total) by mouth 2 (two) times daily.  180 tablet  3  . topiramate (TOPAMAX) 50 MG tablet Take 75 mg by mouth daily.        . vitamin B-12 (CYANOCOBALAMIN) 500 MCG tablet Take 500 mcg by mouth daily.          Review of Systems  Constitutional: Negative.   HENT: Positive for congestion.   Eyes: Negative.   Respiratory: Negative.   Cardiovascular: Negative.   Gastrointestinal: Negative.     Musculoskeletal: Negative.   Skin: Negative.   Neurological: Negative.   Hematological: Negative.   Psychiatric/Behavioral: Negative.   All other systems reviewed and are negative.    BP 110/78  Pulse 91  Ht 5\' 3"  (1.6 m)  Wt 191 lb 8 oz (86.864 kg)  BMI 33.92 kg/m2  Physical Exam  Nursing note and vitals reviewed. Constitutional: She is oriented to person, place, and time. She appears well-developed and well-nourished.  HENT:  Head: Normocephalic.  Nose: Nose normal.  Mouth/Throat: Oropharynx is clear and moist.  Eyes: Conjunctivae are normal. Pupils are equal, round, and reactive to light.  Neck: Normal range of motion. Neck supple. No JVD present.  Cardiovascular: Normal rate, regular rhythm, S1 normal, S2 normal, normal heart sounds and intact distal pulses.  Exam reveals no gallop and no friction rub.   No murmur heard. Pulmonary/Chest: Effort normal and breath sounds normal. No respiratory distress. She has no wheezes. She has no rales. She exhibits no tenderness.  Abdominal: Soft. Bowel sounds are normal. She exhibits no distension. There is no tenderness.  Musculoskeletal: Normal range of motion. She exhibits no edema and no tenderness.  Lymphadenopathy:  She has no cervical adenopathy.  Neurological: She is alert and oriented to person, place, and time. Coordination normal.  Skin: Skin is warm and dry. No rash noted. No erythema.  Psychiatric: She has a normal mood and affect. Her behavior is normal. Judgment and thought content normal.         Assessment and Plan

## 2011-07-21 NOTE — Assessment & Plan Note (Signed)
Cholesterol is at goal on the current lipid regimen. No changes to the medications were made.  

## 2011-07-21 NOTE — Assessment & Plan Note (Signed)
We have encouraged continued exercise, careful diet management in an effort to lose weight. 

## 2011-07-30 ENCOUNTER — Other Ambulatory Visit: Payer: Self-pay

## 2011-07-30 MED ORDER — CARVEDILOL 12.5 MG PO TABS: 12.5000 mg | ORAL_TABLET | Freq: Two times a day (BID) | ORAL | Status: DC

## 2011-08-26 ENCOUNTER — Other Ambulatory Visit: Payer: Self-pay | Admitting: *Deleted

## 2011-08-26 MED ORDER — CARVEDILOL 12.5 MG PO TABS: 12.5000 mg | ORAL_TABLET | Freq: Two times a day (BID) | ORAL | Status: DC

## 2011-08-30 ENCOUNTER — Other Ambulatory Visit: Payer: Self-pay | Admitting: *Deleted

## 2011-10-30 ENCOUNTER — Ambulatory Visit: Payer: Self-pay

## 2011-11-04 ENCOUNTER — Other Ambulatory Visit: Payer: Self-pay | Admitting: *Deleted

## 2011-11-10 ENCOUNTER — Other Ambulatory Visit: Payer: Self-pay | Admitting: Internal Medicine

## 2011-11-10 MED ORDER — SIMVASTATIN 20 MG PO TABS: 20.0000 mg | ORAL_TABLET | Freq: Every evening | ORAL | Status: DC

## 2012-01-07 ENCOUNTER — Encounter: Payer: Self-pay | Admitting: Obstetrics & Gynecology

## 2012-03-21 ENCOUNTER — Telehealth: Payer: Self-pay

## 2012-03-21 NOTE — Telephone Encounter (Signed)
This patient called regarding a bill from 2011 for some [facility]. She needs [place] to call [facility] to clarify bill. She received two one for $1,100 and the other for 3,300. She wants [place] to call her when this has been taken care off. She said she will not be paying this because she was told it wouldn't be more than 150 dollars for the upfront pay.

## 2012-03-30 ENCOUNTER — Other Ambulatory Visit: Payer: Self-pay | Admitting: *Deleted

## 2012-03-30 MED ORDER — CARVEDILOL 12.5 MG PO TABS: 12.5000 mg | ORAL_TABLET | Freq: Two times a day (BID) | ORAL | Status: DC

## 2012-03-30 NOTE — Telephone Encounter (Signed)
Refilled Carvedilol. 

## 2012-05-16 ENCOUNTER — Other Ambulatory Visit: Payer: Self-pay

## 2012-05-16 DIAGNOSIS — Z79899 Other long term (current) drug therapy: Secondary | ICD-10-CM

## 2012-05-16 DIAGNOSIS — E785 Hyperlipidemia, unspecified: Secondary | ICD-10-CM

## 2012-05-30 ENCOUNTER — Ambulatory Visit: Payer: BC Managed Care – PPO | Admitting: Obstetrics & Gynecology

## 2012-06-14 ENCOUNTER — Other Ambulatory Visit: Payer: Self-pay

## 2012-06-14 MED ORDER — SIMVASTATIN 20 MG PO TABS: 20.0000 mg | ORAL_TABLET | Freq: Every evening | ORAL | Status: DC

## 2012-06-21 ENCOUNTER — Ambulatory Visit: Payer: BC Managed Care – PPO | Admitting: Obstetrics & Gynecology

## 2012-07-14 ENCOUNTER — Ambulatory Visit: Payer: BC Managed Care – PPO | Admitting: Obstetrics & Gynecology

## 2012-08-07 ENCOUNTER — Ambulatory Visit: Payer: BC Managed Care – PPO | Admitting: Cardiovascular Disease

## 2012-08-10 ENCOUNTER — Other Ambulatory Visit: Payer: Self-pay

## 2012-08-10 MED ORDER — CARVEDILOL 12.5 MG PO TABS: 12.5000 mg | ORAL_TABLET | Freq: Two times a day (BID) | ORAL | Status: DC

## 2012-08-10 NOTE — Telephone Encounter (Signed)
Refill sent for carvedilol 12.5 mg take one tablet bid.

## 2012-08-21 ENCOUNTER — Ambulatory Visit: Payer: BC Managed Care – PPO | Admitting: Cardiovascular Disease

## 2012-08-22 ENCOUNTER — Ambulatory Visit (INDEPENDENT_AMBULATORY_CARE_PROVIDER_SITE_OTHER): Payer: BC Managed Care – PPO | Admitting: Obstetrics & Gynecology

## 2012-08-22 ENCOUNTER — Encounter: Payer: Self-pay | Admitting: Obstetrics & Gynecology

## 2012-08-22 VITALS — BP 143/86 | HR 101 | Ht 63.0 in | Wt 201.0 lb

## 2012-08-22 DIAGNOSIS — Z0000 Encounter for general adult medical examination without abnormal findings: Secondary | ICD-10-CM

## 2012-08-22 DIAGNOSIS — Z01419 Encounter for gynecological examination (general) (routine) without abnormal findings: Secondary | ICD-10-CM

## 2012-08-22 DIAGNOSIS — Z1151 Encounter for screening for human papillomavirus (HPV): Secondary | ICD-10-CM

## 2012-08-22 DIAGNOSIS — Z124 Encounter for screening for malignant neoplasm of cervix: Secondary | ICD-10-CM

## 2012-08-22 MED ORDER — ALENDRONATE SODIUM 70 MG PO TABS: 70.0000 mg | ORAL_TABLET | ORAL | Status: DC

## 2012-08-22 NOTE — Progress Notes (Signed)
Subjective:    Katherine [name] is a 62 y.o. female who presents for an annual exam. The patient has no complaints today. She didn't take the Evista due to cost but is willing to try generic fosamax.The patient is sexually active. GYN screening history: last pap: was normal. The patient wears seatbelts: yes. The patient participates in regular exercise: no. Has the patient ever been transfused or tattooed?: no. The patient reports that there is not domestic violence in her life.   Menstrual History: OB History    Grav Para Term Preterm Abortions TAB SAB Ect Mult Living                  Menarche age: 48 No LMP recorded. Patient is postmenopausal.    The following portions of the patient's history were reviewed and updated as appropriate: allergies, current medications, past family history, past medical history, past social history, past surgical history and problem list.  Review of Systems A comprehensive review of systems was negative. She is married for 4 years to "the best husband in the world". She denies dyspareunia or v v a. She is a Scientist, water quality at [employer]. She has already had her pap smear this year and will have her mammogram this afternoon at [facility].   Objective:    BP 143/86  Pulse 101  Ht 5\' 3"  (1.6 m)  Wt 201 lb (91.173 kg)  BMI 35.61 kg/m2  General Appearance:    Alert, cooperative, no distress, appears stated age  Head:    Normocephalic, without obvious abnormality, atraumatic  Eyes:    PERRL, conjunctiva/corneas clear, EOM's intact, fundi    benign, both eyes  Ears:    Normal TM's and external ear canals, both ears  Nose:   Nares normal, septum midline, mucosa normal, no drainage    or sinus tenderness  Throat:   Lips, mucosa, and tongue normal; teeth and gums normal  Neck:   Supple, symmetrical, trachea midline, no adenopathy;    thyroid:  no enlargement/tenderness/nodules; no carotid   bruit or JVD  Back:     Symmetric, no  curvature, ROM normal, no CVA tenderness  Lungs:     Clear to auscultation bilaterally, respirations unlabored  Chest Wall:    No tenderness or deformity   Heart:    Regular rate and rhythm, S1 and S2 normal, no murmur, rub   or gallop  Breast Exam:    No tenderness, masses, or nipple abnormality  Abdomen:     Soft, non-tender, bowel sounds active all four quadrants,    no masses, no organomegaly  Genitalia:    Normal female without lesion, discharge or tenderness, NSSA, NT, mobile, normal adnexal exam     Extremities:   Extremities normal, atraumatic, no cyanosis or edema  Pulses:   2+ and symmetric all extremities  Skin:   Skin color, texture, turgor normal, no rashes or lesions  Lymph nodes:   Cervical, supraclavicular, and axillary nodes normal  Neurologic:   CNII-XII intact, normal strength, sensation and reflexes    throughout  .    Assessment:    Healthy female exam.    Plan:     Breast self exam technique reviewed and patient encouraged to perform self-exam monthly. Mammogram. Thin prep Pap smear.  HPV testing

## 2012-08-25 ENCOUNTER — Encounter: Payer: Self-pay | Admitting: Cardiovascular Disease

## 2012-08-25 ENCOUNTER — Ambulatory Visit (INDEPENDENT_AMBULATORY_CARE_PROVIDER_SITE_OTHER): Payer: BC Managed Care – PPO | Admitting: Cardiovascular Disease

## 2012-08-25 VITALS — BP 136/82 | HR 85 | Ht 63.0 in | Wt 201.8 lb

## 2012-08-25 DIAGNOSIS — R0602 Shortness of breath: Secondary | ICD-10-CM

## 2012-08-25 DIAGNOSIS — E669 Obesity, unspecified: Secondary | ICD-10-CM

## 2012-08-25 DIAGNOSIS — I10 Essential (primary) hypertension: Secondary | ICD-10-CM

## 2012-08-25 DIAGNOSIS — E785 Hyperlipidemia, unspecified: Secondary | ICD-10-CM

## 2012-08-25 MED ORDER — SPIRONOLACTONE 25 MG PO TABS: 25.0000 mg | ORAL_TABLET | Freq: Two times a day (BID) | ORAL | Status: DC

## 2012-08-25 MED ORDER — SIMVASTATIN 20 MG PO TABS: 20.0000 mg | ORAL_TABLET | Freq: Every evening | ORAL | Status: DC

## 2012-08-25 MED ORDER — CARVEDILOL 12.5 MG PO TABS: 12.5000 mg | ORAL_TABLET | Freq: Two times a day (BID) | ORAL | Status: DC

## 2012-08-25 NOTE — Assessment & Plan Note (Signed)
Cholesterol is at goal on the current lipid regimen. No changes to the medications were made.  

## 2012-08-25 NOTE — Assessment & Plan Note (Signed)
Cholesterol significantly improved on statin. We will recheck her lipids.

## 2012-08-25 NOTE — Assessment & Plan Note (Signed)
We have encouraged continued exercise, careful diet management in an effort to lose weight. 

## 2012-08-25 NOTE — Assessment & Plan Note (Signed)
chronic shortness of breath likely from deconditioning and her weight. EKG normal, no history of coronary artery disease, prior normal echocardiogram. No further studies needed. Shortness of breath has been a chronic issue.

## 2012-08-25 NOTE — Patient Instructions (Addendum)
Try extra coreg in the AM (total of 25 mg), continue 12.5 mg in the PM For fast heart rate  Please come in for cholesterol check at your convenience  Please call us if you have new issues that need to be addressed before your next appt.  Your physician wants you to follow-up in: 6 months.  You will receive a reminder letter in the mail two months in advance. If you don't receive a letter, please call our office to schedule the follow-up appointment.

## 2012-08-25 NOTE — Progress Notes (Signed)
Patient ID: Katherine [name], female    DOB: 03-Jun-1951, 62 y.o.   MRN: [id number]  HPI Comments: Katherine [name] is a 62 y/o woman with a h/o CP with no CAD on cath 2007, HTN and obesity. She returns today for routine follow-up.     Previous echo  showed EF 65% no signifcant valvular disease.  Possible OSA by hx. Katherine [name] continues to battle with her weight. She does not do any regular exercise. She continues to teach long hours. She reports having shortness of breath with any activity such as walking in school. She is tired at home and unable to exercise at the end of the day.  No edema, PND or orthopnea. Not following BP closely. Watches her diet closley and avoids fatty foods.   Cholesterol is significantly improved on  dose simvastatin. EKG shows normal sinus rhythm with rate 85 beats per minute with no significant ST or T wave changes     Outpatient Encounter Prescriptions as of 08/25/2012  Medication Sig Dispense Refill  . alendronate (FOSAMAX) 70 MG tablet Take 1 tablet (70 mg total) by mouth every 7 (seven) days. Take with a full glass of water on an empty stomach.  4 tablet  12  . ARIPiprazole (ABILIFY) 2 MG tablet Take 2 mg by mouth daily.        Marland Kitchen aspirin 81 MG tablet Take 81 mg by mouth daily.        . carvedilol (COREG) 12.5 MG tablet Take 1 tablet (12.5 mg total) by mouth 2 (two) times daily.  180 tablet  3  . cholecalciferol (VITAMIN D) 1000 UNITS tablet Take 1,000 Units by mouth daily.        . DULoxetine (CYMBALTA) 30 MG capsule Take 90 mg by mouth daily.        . methylphenidate (CONCERTA) 18 MG CR tablet Take 18 mg by mouth daily.        . simvastatin (ZOCOR) 20 MG tablet Take 1 tablet (20 mg total) by mouth every evening.  90 tablet  3  . spironolactone (ALDACTONE) 25 MG tablet Take 1 tablet (25 mg total) by mouth 2 (two) times daily.  180 tablet  3  . topiramate (TOPAMAX) 50 MG tablet Take 75 mg by mouth daily.        . vitamin B-12 (CYANOCOBALAMIN) 500 MCG tablet Take  500 mcg by mouth daily.         Review of Systems  Constitutional: Negative.   Eyes: Negative.   Respiratory: Positive for shortness of breath.   Cardiovascular: Negative.   Gastrointestinal: Negative.   Musculoskeletal: Negative.   Skin: Negative.   Neurological: Negative.   Hematological: Negative.   Psychiatric/Behavioral: Negative.   All other systems reviewed and are negative.    BP 136/82  Pulse 85  Ht 5\' 3"  (1.6 m)  Wt 201 lb 12 oz (91.513 kg)  BMI 35.74 kg/m2  Physical Exam  Nursing note and vitals reviewed. Constitutional: She is oriented to person, place, and time. She appears well-developed and well-nourished.       Obese  HENT:  Head: Normocephalic.  Nose: Nose normal.  Mouth/Throat: Oropharynx is clear and moist.  Eyes: Conjunctivae normal are normal. Pupils are equal, round, and reactive to light.  Neck: Normal range of motion. Neck supple. No JVD present.  Cardiovascular: Normal rate, regular rhythm, S1 normal, S2 normal, normal heart sounds and intact distal pulses.  Exam reveals no gallop and no friction rub.  No murmur heard. Pulmonary/Chest: Effort normal and breath sounds normal. No respiratory distress. She has no wheezes. She has no rales. She exhibits no tenderness.  Abdominal: Soft. Bowel sounds are normal. She exhibits no distension. There is no tenderness.  Musculoskeletal: Normal range of motion. She exhibits no edema and no tenderness.  Lymphadenopathy:    She has no cervical adenopathy.  Neurological: She is alert and oriented to person, place, and time. Coordination normal.  Skin: Skin is warm and dry. No rash noted. No erythema.  Psychiatric: She has a normal mood and affect. Her behavior is normal. Judgment and thought content normal.         Assessment and Plan

## 2012-08-29 ENCOUNTER — Ambulatory Visit (INDEPENDENT_AMBULATORY_CARE_PROVIDER_SITE_OTHER): Payer: BC Managed Care – PPO

## 2012-08-29 DIAGNOSIS — E785 Hyperlipidemia, unspecified: Secondary | ICD-10-CM

## 2012-08-30 LAB — LIPID PANEL
Chol/HDL Ratio: 2.5 ratio units (ref 0.0–4.4)
HDL: 49 mg/dL (ref >39)
LDL Calculated: 53 mg/dL (ref 0–99)
VLDL Cholesterol Cal: 19 mg/dL (ref 5–40)

## 2012-08-30 LAB — HEPATIC FUNCTION PANEL
Albumin: 4.2 g/dL (ref 3.6–4.8)
Alkaline Phosphatase: 129 IU/L — ABNORMAL HIGH (ref 39–117)
Total Protein: 6.4 g/dL (ref 6.0–8.5)

## 2012-10-09 ENCOUNTER — Other Ambulatory Visit: Payer: Self-pay | Admitting: Cardiovascular Disease

## 2013-03-12 ENCOUNTER — Other Ambulatory Visit: Payer: Self-pay | Admitting: *Deleted

## 2013-03-12 MED ORDER — ASPIRIN 81 MG PO TABS: 81.0000 mg | ORAL_TABLET | Freq: Every day | ORAL | Status: DC

## 2013-03-12 NOTE — Telephone Encounter (Signed)
Refill Carvedilol #30 R#1 sent to [facility]. lmtcb pt needs to contact office to schedule 6 month f/u appointment.

## 2013-03-14 ENCOUNTER — Other Ambulatory Visit (HOSPITAL_COMMUNITY): Payer: Self-pay | Admitting: Internal Medicine

## 2013-03-15 ENCOUNTER — Other Ambulatory Visit: Payer: Self-pay | Admitting: *Deleted

## 2013-03-15 MED ORDER — CARVEDILOL 12.5 MG PO TABS: ORAL_TABLET | ORAL | Status: DC

## 2013-03-15 NOTE — Telephone Encounter (Signed)
Refilled Carvedilol #60 R#1 sent to [facility]. Lmtcb pt needs to schedule future appointment w/ Dr. Mariah Milling.

## 2013-06-11 ENCOUNTER — Other Ambulatory Visit: Payer: Self-pay | Admitting: Cardiovascular Disease

## 2013-06-11 NOTE — Telephone Encounter (Signed)
Requested Prescriptions   Signed Prescriptions Disp Refills  . carvedilol (COREG) 12.5 MG tablet 60 tablet 1    Sig: TAKE 1 TABLET TWICE DAILY    Authorizing Provider: GOLLAN, TIMOTHY J    Ordering User: Jocilyn Trego C    

## 2013-06-12 ENCOUNTER — Other Ambulatory Visit: Payer: Self-pay | Admitting: *Deleted

## 2013-06-12 MED ORDER — CARVEDILOL 12.5 MG PO TABS: ORAL_TABLET | ORAL | Status: DC

## 2013-06-12 NOTE — Telephone Encounter (Signed)
Requested Prescriptions   Signed Prescriptions Disp Refills  . carvedilol (COREG) 12.5 MG tablet 60 tablet 1    Sig: TAKE 1 TABLET TWICE DAILY    Authorizing Provider: Antonieta Iba    Ordering User: Kendrick Fries

## 2013-06-22 ENCOUNTER — Ambulatory Visit (INDEPENDENT_AMBULATORY_CARE_PROVIDER_SITE_OTHER): Payer: BC Managed Care – PPO | Admitting: Cardiovascular Disease

## 2013-06-22 ENCOUNTER — Encounter: Payer: Self-pay | Admitting: Cardiovascular Disease

## 2013-06-22 VITALS — BP 122/72 | HR 92 | Ht 63.0 in | Wt 207.8 lb

## 2013-06-22 DIAGNOSIS — E785 Hyperlipidemia, unspecified: Secondary | ICD-10-CM

## 2013-06-22 DIAGNOSIS — E669 Obesity, unspecified: Secondary | ICD-10-CM

## 2013-06-22 DIAGNOSIS — I10 Essential (primary) hypertension: Secondary | ICD-10-CM

## 2013-06-22 NOTE — Patient Instructions (Signed)
You are doing well. No medication changes were made.  We will check your blood work today  Please call us if you have new issues that need to be addressed before your next appt.  Your physician wants you to follow-up in: 12 months.  You will receive a reminder letter in the mail two months in advance. If you don't receive a letter, please call our office to schedule the follow-up appointment.

## 2013-06-22 NOTE — Assessment & Plan Note (Signed)
We have encouraged continued exercise, careful diet management in an effort to lose weight. 

## 2013-06-22 NOTE — Assessment & Plan Note (Signed)
Cholesterol is at goal on the current lipid regimen. No changes to the medications were made.  

## 2013-06-22 NOTE — Progress Notes (Signed)
Patient ID: Katherine [name], female    DOB: 06-11-1951, 62 y.o.   MRN: [id number]  HPI Comments: Katherine [name] is a 62 y/o woman with a h/o CP with no CAD on cath 2007, HTN and obesity. She returns today for routine follow-up.     Previous echo  showed EF 65% no signifcant valvular disease.  Possible OSA by hx.  Katherine [name] continues to battle with her weight. She does not do any regular exercise. 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   She is tired at home and unable to exercise at the end of the day.  No edema, PND or orthopnea. Not following BP closely.  She does report having a whooshing noise in her right ear at the end of the day, not every day.  Cholesterol is significantly improved on  dose simvastatin. LDL less than 70 EKG shows normal sinus rhythm with rate 92 beats per minute with no significant ST or T wave changes     Outpatient Encounter Prescriptions as of 06/22/2013  Medication Sig  . alendronate (FOSAMAX) 70 MG tablet Take 1 tablet (70 mg total) by mouth every 7 (seven) days. Take with a full glass of water on an empty stomach.  . ARIPiprazole (ABILIFY) 2 MG tablet Take 2 mg by mouth daily.    Marland Kitchen aspirin 81 MG tablet Take 1 tablet (81 mg total) by mouth daily.  . carvedilol (COREG) 12.5 MG tablet TAKE 1 TABLET TWICE DAILY  . cholecalciferol (VITAMIN D) 1000 UNITS tablet Take 1,000 Units by mouth daily.    . DULoxetine (CYMBALTA) 30 MG capsule Take 120 mg by mouth daily.   . methylphenidate (CONCERTA) 18 MG CR tablet Take 18 mg by mouth daily.    . simvastatin (ZOCOR) 20 MG tablet Take 1 tablet (20 mg total) by mouth every evening.  Marland Kitchen spironolactone (ALDACTONE) 25 MG tablet Take 1 tablet (25 mg total) by mouth 2 (two) times daily.  Marland Kitchen spironolactone (ALDACTONE) 25 MG tablet TAKE 1 TABLET (25 MG TOTAL) BY MOUTH 2 (TWO) TIMES DAILY.  . pantoprazole (PROTONIX) 40 MG tablet   . [DISCONTINUED] topiramate (TOPAMAX) 50 MG tablet Take 75 mg by mouth daily.     . [DISCONTINUED] vitamin B-12 (CYANOCOBALAMIN) 500 MCG tablet Take 500 mcg by mouth daily.      Review of Systems  Constitutional: Negative.   HENT:       Pulsating noise in her right ear in the late afternoon  Eyes: Negative.   Cardiovascular: Negative.   Gastrointestinal: Negative.   Musculoskeletal: Negative.   Skin: Negative.   Neurological: Negative.   Psychiatric/Behavioral: Negative.   All other systems reviewed and are negative.   BP 122/72  Pulse 92  Ht 5\' 3"  (1.6 m)  Wt 207 lb 12 oz (94.235 kg)  BMI 36.81 kg/m2  Physical Exam  Nursing note and vitals reviewed. Constitutional: She is oriented to person, place, and time. She appears well-developed and well-nourished.  Obese  HENT:  Head: Normocephalic.  Nose: Nose normal.  Mouth/Throat: Oropharynx is clear and moist.  Eyes: Conjunctivae are normal. Pupils are equal, round, and reactive to light.  Neck: Normal range of motion. Neck supple. No JVD present.  Cardiovascular: Normal rate, regular rhythm, S1 normal, S2 normal, normal heart sounds and intact distal pulses.  Exam reveals no gallop and no friction rub.   No murmur heard. Pulmonary/Chest: Effort normal and breath sounds normal. No respiratory distress. She has no wheezes. She  has no rales. She exhibits no tenderness.  Abdominal: Soft. Bowel sounds are normal. She exhibits no distension. There is no tenderness.  Musculoskeletal: Normal range of motion. She exhibits no edema and no tenderness.  Lymphadenopathy:    She has no cervical adenopathy.  Neurological: She is alert and oriented to person, place, and time. Coordination normal.  Skin: Skin is warm and dry. No rash noted. No erythema.  Psychiatric: She has a normal mood and affect. Her behavior is normal. Judgment and thought content normal.    Assessment and Plan

## 2013-06-22 NOTE — Assessment & Plan Note (Signed)
Blood pressure is well controlled on today's visit. No changes made to the medications. 

## 2013-06-26 LAB — LIPID PANEL
HDL: 48 mg/dL (ref >39)
LDL Calculated: 60 mg/dL (ref 0–99)
Triglycerides: 73 mg/dL (ref 0–149)
VLDL Cholesterol Cal: 15 mg/dL (ref 5–40)

## 2013-06-26 LAB — HEPATIC FUNCTION PANEL
ALT: 13 IU/L (ref 0–32)
AST: 18 IU/L (ref 0–40)
Alkaline Phosphatase: 132 IU/L — ABNORMAL HIGH (ref 39–117)
Bilirubin, Direct: 0.06 mg/dL (ref 0.00–0.40)
Total Protein: 6 g/dL (ref 6.0–8.5)

## 2013-07-16 ENCOUNTER — Other Ambulatory Visit (HOSPITAL_COMMUNITY): Payer: Self-pay

## 2013-07-16 MED ORDER — CARVEDILOL 12.5 MG PO TABS: ORAL_TABLET | ORAL | Status: DC

## 2013-07-26 ENCOUNTER — Other Ambulatory Visit: Payer: Self-pay | Admitting: Cardiovascular Disease

## 2013-07-26 ENCOUNTER — Other Ambulatory Visit: Payer: Self-pay | Admitting: *Deleted

## 2013-07-26 MED ORDER — ASPIRIN 81 MG PO TABS: 81.0000 mg | ORAL_TABLET | Freq: Every day | ORAL | Status: DC

## 2013-07-26 NOTE — Telephone Encounter (Signed)
Requested Prescriptions   Signed Prescriptions Disp Refills  . aspirin 81 MG tablet 30 tablet 3    Sig: Take 1 tablet (81 mg total) by mouth daily.    Authorizing Provider: Minna Merritts    Ordering User: Britt Bottom

## 2013-09-04 ENCOUNTER — Encounter: Payer: Self-pay | Admitting: Obstetrics & Gynecology

## 2013-09-05 ENCOUNTER — Other Ambulatory Visit: Payer: Self-pay | Admitting: Cardiovascular Disease

## 2013-09-17 ENCOUNTER — Other Ambulatory Visit: Payer: Self-pay | Admitting: Cardiovascular Disease

## 2013-10-31 ENCOUNTER — Other Ambulatory Visit: Payer: Self-pay | Admitting: Cardiovascular Disease

## 2013-12-06 ENCOUNTER — Ambulatory Visit: Payer: BC Managed Care – PPO | Admitting: Obstetrics & Gynecology

## 2013-12-10 ENCOUNTER — Other Ambulatory Visit: Payer: Self-pay | Admitting: Cardiovascular Disease

## 2014-03-15 ENCOUNTER — Other Ambulatory Visit: Payer: Self-pay | Admitting: Cardiovascular Disease

## 2014-06-24 ENCOUNTER — Ambulatory Visit: Payer: BC Managed Care – PPO | Admitting: Cardiovascular Disease

## 2014-06-24 ENCOUNTER — Encounter: Payer: Self-pay | Admitting: *Deleted

## 2014-06-28 ENCOUNTER — Encounter: Payer: Self-pay | Admitting: *Deleted

## 2014-06-28 ENCOUNTER — Ambulatory Visit: Payer: BC Managed Care – PPO | Admitting: Cardiovascular Disease

## 2014-07-30 ENCOUNTER — Other Ambulatory Visit: Payer: Self-pay | Admitting: Cardiovascular Disease

## 2014-08-08 ENCOUNTER — Other Ambulatory Visit: Payer: Self-pay

## 2014-08-08 MED ORDER — CARVEDILOL 12.5 MG PO TABS: ORAL_TABLET | ORAL | Status: DC

## 2014-08-27 ENCOUNTER — Ambulatory Visit: Payer: BC Managed Care – PPO | Admitting: Cardiovascular Disease

## 2014-09-10 ENCOUNTER — Encounter: Payer: Self-pay | Admitting: Obstetrics & Gynecology

## 2014-09-10 ENCOUNTER — Ambulatory Visit (INDEPENDENT_AMBULATORY_CARE_PROVIDER_SITE_OTHER): Payer: BC Managed Care – PPO | Admitting: Obstetrics & Gynecology

## 2014-09-10 VITALS — BP 134/87 | HR 101 | Ht 63.0 in | Wt 197.2 lb

## 2014-09-10 DIAGNOSIS — R350 Frequency of micturition: Secondary | ICD-10-CM

## 2014-09-10 DIAGNOSIS — R631 Polydipsia: Secondary | ICD-10-CM | POA: Diagnosis not present

## 2014-09-10 DIAGNOSIS — N3941 Urge incontinence: Secondary | ICD-10-CM | POA: Diagnosis not present

## 2014-09-10 LAB — POCT URINALYSIS DIPSTICK
Bilirubin, UA: NEGATIVE
Blood, UA: NEGATIVE
Glucose, UA: NEGATIVE
KETONES UA: NEGATIVE
Leukocytes, UA: NEGATIVE
Nitrite, UA: NEGATIVE
PROTEIN UA: NEGATIVE
Spec Grav, UA: >=1.03
UROBILINOGEN UA: NEGATIVE
pH, UA: 6

## 2014-09-10 NOTE — Progress Notes (Signed)
   Subjective:    Patient ID: Katherine [name], female    DOB: 05/07/1951, 64 y.o.   MRN: [id number]  HPI  64 yo lady here with a 2 month history of polyuria and occasional urge incontinence. She also complains of polydipsia.  Review of Systems     Objective:   Physical Exam  WNWHWFNAD, obese Abd- benign EG- normal Vagina- no evidence of sling erosion or movement      Assessment & Plan:  New onset urge incontince, polyuria, polydypsia- check hba1c and ua and uc&s.

## 2014-09-11 LAB — HEMOGLOBIN A1C
Hgb A1c MFr Bld: 6.2 % — ABNORMAL HIGH (ref <5.7)
Mean Plasma Glucose: 131 mg/dL — ABNORMAL HIGH (ref <117)

## 2014-09-12 ENCOUNTER — Encounter: Payer: Self-pay | Admitting: *Deleted

## 2014-09-12 ENCOUNTER — Telehealth: Payer: Self-pay | Admitting: *Deleted

## 2014-09-12 LAB — URINE CULTURE: Colony Count: 45000

## 2014-09-12 NOTE — Telephone Encounter (Signed)
Patient notified of abnormal hga1c.  She will follow up with her primary care doctor to discuss treatment options.

## 2014-10-01 ENCOUNTER — Ambulatory Visit (INDEPENDENT_AMBULATORY_CARE_PROVIDER_SITE_OTHER): Payer: BC Managed Care – PPO | Admitting: Obstetrics & Gynecology

## 2014-10-01 ENCOUNTER — Encounter: Payer: Self-pay | Admitting: Obstetrics & Gynecology

## 2014-10-01 VITALS — BP 121/83 | HR 98 | Wt 199.0 lb

## 2014-10-01 DIAGNOSIS — R7309 Other abnormal glucose: Secondary | ICD-10-CM

## 2014-10-01 DIAGNOSIS — R7303 Prediabetes: Secondary | ICD-10-CM

## 2014-10-01 NOTE — Progress Notes (Signed)
   Subjective:    Patient ID: Katherine [name], female    DOB: [birth year], 64 y.o.   MRN: [id number]  HPI She is here today to follow up on her labs. Her HBA1C was 6.2. I believe that she has early DM and will send her to Dr. Deborra Medina for weight loss and management.   Review of Systems     Objective:   Physical Exam  WNWHWFNAD Breathing and ambulating normally      Assessment & Plan:  She will come in for fasting labs prior to her visit with Dr. Deborra Medina

## 2014-10-05 ENCOUNTER — Other Ambulatory Visit: Payer: Self-pay | Admitting: Cardiovascular Disease

## 2014-10-25 ENCOUNTER — Encounter: Payer: Self-pay | Admitting: *Deleted

## 2014-10-25 ENCOUNTER — Ambulatory Visit: Payer: BC Managed Care – PPO | Admitting: Cardiovascular Disease

## 2014-10-30 ENCOUNTER — Encounter: Payer: Self-pay | Admitting: Primary Care

## 2014-10-30 ENCOUNTER — Ambulatory Visit (INDEPENDENT_AMBULATORY_CARE_PROVIDER_SITE_OTHER): Payer: BC Managed Care – PPO | Admitting: Primary Care

## 2014-10-30 VITALS — BP 122/76 | HR 90 | Temp 97.6°F | Ht 63.0 in | Wt 203.8 lb

## 2014-10-30 DIAGNOSIS — R5383 Other fatigue: Secondary | ICD-10-CM | POA: Diagnosis not present

## 2014-10-30 DIAGNOSIS — G4733 Obstructive sleep apnea (adult) (pediatric): Secondary | ICD-10-CM | POA: Diagnosis not present

## 2014-10-30 DIAGNOSIS — E669 Obesity, unspecified: Secondary | ICD-10-CM

## 2014-10-30 DIAGNOSIS — F329 Major depressive disorder, single episode, unspecified: Secondary | ICD-10-CM

## 2014-10-30 DIAGNOSIS — R7309 Other abnormal glucose: Secondary | ICD-10-CM | POA: Diagnosis not present

## 2014-10-30 DIAGNOSIS — I10 Essential (primary) hypertension: Secondary | ICD-10-CM | POA: Diagnosis not present

## 2014-10-30 DIAGNOSIS — R7303 Prediabetes: Secondary | ICD-10-CM

## 2014-10-30 DIAGNOSIS — F32A Depression, unspecified: Secondary | ICD-10-CM

## 2014-10-30 LAB — BASIC METABOLIC PANEL
BUN: 15 mg/dL (ref 6–23)
CO2: 22 mEq/L (ref 19–32)
Calcium: 9.2 mg/dL (ref 8.4–10.5)
Chloride: 108 mEq/L (ref 96–112)
Creatinine, Ser: 0.75 mg/dL (ref 0.40–1.20)
GFR: 82.94 mL/min (ref >60.00)
Glucose, Bld: 98 mg/dL (ref 70–99)
Potassium: 3.8 mEq/L (ref 3.5–5.1)
SODIUM: 138 meq/L (ref 135–145)

## 2014-10-30 LAB — CBC WITH DIFFERENTIAL/PLATELET
Basophils Absolute: 0.1 10*3/uL (ref 0.0–0.1)
Basophils Relative: 0.5 % (ref 0.0–3.0)
EOS PCT: 2.5 % (ref 0.0–5.0)
Eosinophils Absolute: 0.3 10*3/uL (ref 0.0–0.7)
HEMATOCRIT: 36.7 % (ref 36.0–46.0)
HEMOGLOBIN: 11.7 g/dL — AB (ref 12.0–15.0)
LYMPHS ABS: 2.8 10*3/uL (ref 0.7–4.0)
Lymphocytes Relative: 21.3 % (ref 12.0–46.0)
MCHC: 32 g/dL (ref 30.0–36.0)
MCV: 76.6 fl — AB (ref 78.0–100.0)
MONO ABS: 0.8 10*3/uL (ref 0.1–1.0)
MONOS PCT: 6.4 % (ref 3.0–12.0)
Neutro Abs: 9 10*3/uL — ABNORMAL HIGH (ref 1.4–7.7)
Neutrophils Relative %: 69.3 % (ref 43.0–77.0)
Platelets: 374 10*3/uL (ref 150.0–400.0)
RBC: 4.8 Mil/uL (ref 3.87–5.11)
RDW: 17.8 % — ABNORMAL HIGH (ref 11.5–15.5)
WBC: 13 10*3/uL — ABNORMAL HIGH (ref 4.0–10.5)

## 2014-10-30 LAB — TSH: TSH: 1.09 u[IU]/mL (ref 0.35–4.50)

## 2014-10-30 NOTE — Progress Notes (Signed)
Pre visit review using our clinic review tool, if applicable. No additional management support is needed unless otherwise documented below in the visit note. 

## 2014-10-30 NOTE — Progress Notes (Signed)
Subjective:    Patient ID: Katherine [name], female    DOB: [birth year], 64 y.o.   MRN: [id number]  HPI  Katherine [name] is a 64 year old female who presents today to establish care and discuss the problems mentioned below. Will obtain old records.  1) Hypertension: Diagnosed 10 years ago. Is currently taking 12.5mg  of carvedilol and spironolactone. She see's Dr. Rockey Situ with [facility]. Denies chest pain, and does report occasional headaches, history of migraines.  2) OSA: Wakes up at night, feels tired when waking. Has not had sleep study and is currently not interested.  3) Hyperlipidemia: Endorses a healthy diet with fruits, vegetables, broiled/baked meats. No fast food, no fried foods. Takes simvastatin daily.   4) Obesity: She has little energy to exercise. Has gained 70-80 pounds over the past 10 years. She has been on contrave before in the past for weight loss and has been successful. She endorses eating cookies and cakes twice weekly, does not eat bread, and will drink water and unsweet tea.  Body mass index is 36.11 kg/(m^2).  5) Depression: Diagnosed 20 years ago. See Zettie Pho at [facility]. She is currently taking Topamax, Cymbalta, Concerta, and Abilify. She feels well controlled on these medications.  6) Borderline diabetes: A1C was 6.2 in February 2016. She is working hard on her diet and exercise and plans to lose weight to prevent going on medications.  Review of Systems  Constitutional: Negative for unexpected weight change.       Steady weight gain over the past 10 years.  HENT: Negative for rhinorrhea.        Seasonal allergies  Respiratory: Positive for shortness of breath. Negative for cough and wheezing.   Cardiovascular: Negative for chest pain.  Gastrointestinal: Negative for diarrhea.       Bowel movements every 3 days.  Endocrine: Positive for polydipsia and polyuria. Negative for polyphagia.  Genitourinary: Negative for dysuria.         Has vaginal sling inserted in 2005. In the correct place, recently checked.  Musculoskeletal:       Back pain, follows with Dr. Arnoldo Morale at [facility] in [place]  Skin: Negative for rash.  Neurological: Negative for headaches.  Hematological: Negative for adenopathy.  Psychiatric/Behavioral:       Depression well controlled.       Past Medical History  Diagnosis Date  . 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 Hypertension   . Hypokalemia   . Obesity   . Migraine headache   . Depression   . High cholesterol   . Chicken pox     age 75  . Chronic back pain     Compressed discs. [place]  . GERD (gastroesophageal reflux disease)   . Borderline diabetes     History   Social History  . Marital Status: Married    Spouse Name: N/A  . Number of Children: N/A  . Years of Education: N/A   Occupational History  . Teacher    Social History Main Topics  . Smoking status: Never Smoker   . Smokeless tobacco: Never Used  . Alcohol Use: No  . Drug Use: No  . Sexual Activity:    Partners: Male    Birth Control/ Protection: Post-menopausal   Other Topics Concern  . Not on file   Social History Narrative  Married.   One son is 25, lives in [place].   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   Enjoys substitute teaching, gardening, cooking.    Past Surgical History  Procedure Laterality Date  . Cesarean section    . Foot surgery      bilateral bunions  . Tonsillectomy  1971  . Endometrial ablation  2007  . Bladder suspension  2005    Family History  Problem Relation Age of Onset  . Heart disease Mother   . Hypertension Mother   . Heart failure Mother     CHF  . Diabetes Mother   . Cancer Mother     CERVICAL CANCER  . Heart disease Father   . Diabetes Father   . Liver disease Father   . Hypertension Father   . Diabetes Sister    . Heart disease Brother   . Heart failure Brother     CHF  . Diabetes Brother   . Cancer Brother 53    THROAT AND NECK  . Cancer Maternal Grandfather     Allergies  Allergen Reactions  . Darvon   . Propoxyphene Hcl   . Codeine Anxiety  . Hydrocodone Anxiety  . Meperidine Anxiety  . Oxycodone Anxiety  . Tramadol Anxiety    Current Outpatient Prescriptions on File Prior to Visit  Medication Sig Dispense Refill  . ARIPiprazole (ABILIFY) 2 MG tablet Take 2 mg by mouth 2 (two) times daily.     Marland Kitchen aspirin 81 MG chewable tablet TAKE 1 TABLET (81 MG TOTAL) BY MOUTH DAILY. 30 tablet 1  . carvedilol (COREG) 12.5 MG tablet TAKE 1 TABLET BY MOUTH TWICE A DAY 60 tablet 3  . cholecalciferol (VITAMIN D) 1000 UNITS tablet Take 1,000 Units by mouth daily.      . DULoxetine (CYMBALTA) 60 MG capsule Take 120 mg by mouth every morning.  0  . methylphenidate (CONCERTA) 18 MG CR tablet Take 18 mg by mouth daily.      . pantoprazole (PROTONIX) 40 MG tablet Take 40 mg by mouth daily.     . simvastatin (ZOCOR) 20 MG tablet TAKE 1 TABLET (20 MG TOTAL) BY MOUTH EVERY EVENING. 90 tablet 3  . spironolactone (ALDACTONE) 25 MG tablet TAKE 1 TABLET (25 MG TOTAL) BY MOUTH 2 (TWO) TIMES DAILY. 180 tablet 3  . topiramate (TOPAMAX) 100 MG tablet Take 100 mg by mouth at bedtime.  1   No current facility-administered medications on file prior to visit.    BP 122/76 mmHg  Pulse 90  Temp(Src) 97.6 F (36.4 C) (Oral)  Ht 5\' 3"  (1.6 m)  Wt 203 lb 12.8 oz (92.443 kg)  BMI 36.11 kg/m2  SpO2 99%    Objective:   Physical Exam  Constitutional: She is oriented to person, place, and time. She appears well-developed.  HENT:  Head: Normocephalic.  Right Ear: External ear normal.  Left Ear: External ear normal.  Nose: Nose normal.  Mouth/Throat: Oropharynx is clear and moist.  Eyes: Conjunctivae and EOM are normal. Pupils are equal, round, and reactive to light.  Neck: Neck supple. No thyromegaly present.   Cardiovascular: Normal rate and regular rhythm.   Pulmonary/Chest: Effort normal and breath sounds normal.  Abdominal: Soft. Bowel sounds are normal. She exhibits no mass. There is no tenderness.  Lymphadenopathy:    She has no cervical adenopathy.  Neurological: She is alert and oriented to person, place, and time. She has normal reflexes. No cranial nerve deficit.  Skin: Skin is warm and  dry.  Psychiatric: She has a normal mood and affect.          Assessment & Plan:

## 2014-10-30 NOTE — Patient Instructions (Addendum)
Complete lab work prior to leaving today. I will notify you of your results. Continue your efforts of healthy diet. You must limit your intake of sugary foods and carbohydrates such as cake, cookies, chips, breads, etc.  I recommend a sleep study for evaluation of sleep apnea, please consider. Follow up with me in 6 weeks (after May 23rd) for follow up of diabetes and weight. Prior to that appointment please set up a lab appointment to have your A1C drawn on Monday May 23rd.  It was a pleasure to meet you today! Please don't hesitate to call me with any questions. Welcome to [facility]!  Diabetes Mellitus and Food It is important for you to manage your blood sugar (glucose) level. Your blood glucose level can be greatly affected by what you eat. Eating healthier foods in the appropriate amounts throughout the day at about the same time each day will help you control your blood glucose level. It can also help slow or prevent worsening of your diabetes mellitus. Healthy eating may even help you improve the level of your blood pressure and reach or maintain a healthy weight.  HOW CAN FOOD AFFECT ME? Carbohydrates Carbohydrates affect your blood glucose level more than any other type of food. Your dietitian will help you determine how many carbohydrates to eat at each meal and teach you how to count carbohydrates. Counting carbohydrates is important to keep your blood glucose at a healthy level, especially if you are using insulin or taking certain medicines for diabetes mellitus. Alcohol Alcohol can cause sudden decreases in blood glucose (hypoglycemia), especially if you use insulin or take certain medicines for diabetes mellitus. Hypoglycemia can be a life-threatening condition. Symptoms of hypoglycemia (sleepiness, dizziness, and disorientation) are similar to symptoms of having too much alcohol.  If your health care provider has given you approval to drink alcohol, do so in moderation and use the  following guidelines:  Women should not have more than one drink per day, and men should not have more than two drinks per day. One drink is equal to:  12 oz of beer.  5 oz of wine.  1 oz of hard liquor.  Do not drink on an empty stomach.  Keep yourself hydrated. Have water, diet soda, or unsweetened iced tea.  Regular soda, juice, and other mixers might contain a lot of carbohydrates and should be counted. WHAT FOODS ARE NOT RECOMMENDED? As you make food choices, it is important to remember that all foods are not the same. Some foods have fewer nutrients per serving than other foods, even though they might have the same number of calories or carbohydrates. It is difficult to get your body what it needs when you eat foods with fewer nutrients. Examples of foods that you should avoid that are high in calories and carbohydrates but low in nutrients include:  Trans fats (most processed foods list trans fats on the Nutrition Facts label).  Regular soda.  Juice.  Candy.  Sweets, such as cake, pie, doughnuts, and cookies.  Fried foods. WHAT FOODS CAN I EAT? Have nutrient-rich foods, which will nourish your body and keep you healthy. The food you should eat also will depend on several factors, including:  The calories you need.  The medicines you take.  Your weight.  Your blood glucose level.  Your blood pressure level.  Your cholesterol level. You also should eat a variety of foods, including:  Protein, such as meat, poultry, fish, tofu, nuts, and seeds (lean animal proteins are  best).  Fruits.  Vegetables.  Dairy products, such as milk, cheese, and yogurt (low fat is best).  Breads, grains, pasta, cereal, rice, and beans.  Fats such as olive oil, trans fat-free margarine, canola oil, avocado, and olives. DOES EVERYONE WITH DIABETES MELLITUS HAVE THE SAME MEAL PLAN? Because every person with diabetes mellitus is different, there is not one meal plan that works for  everyone. It is very important that you meet with a dietitian who will help you create a meal plan that is just right for you. Document Released: 04/01/2005 Document Revised: 07/10/2013 Document Reviewed: 06/01/2013 [facility] Patient Information 2015 [place], [place]. This information is not intended to replace advice given to you by your health care provider. Make sure you discuss any questions you have with your health care provider.

## 2014-10-31 DIAGNOSIS — F32A Depression, unspecified: Secondary | ICD-10-CM | POA: Insufficient documentation

## 2014-10-31 DIAGNOSIS — R7303 Prediabetes: Secondary | ICD-10-CM | POA: Insufficient documentation

## 2014-10-31 DIAGNOSIS — F329 Major depressive disorder, single episode, unspecified: Secondary | ICD-10-CM | POA: Insufficient documentation

## 2014-10-31 NOTE — Assessment & Plan Note (Signed)
Discussed importance of healthy diet and exercise. She is interested in a weight loss supplement for help. We will discuss this further during another visit, but in the meantime I challenged her to lose 3-5 pounds by the next appointment.  Body mass index is 36.11 kg/(m^2).

## 2014-10-31 NOTE — Assessment & Plan Note (Signed)
A1C of 6.2 in February of 2016. She is working hard on her diet and plans on exercising. Discussed importance of diet and exercise in the prevention of diabetes. Will recheck A1C in May, if still elevated, will start medications.

## 2014-10-31 NOTE — Assessment & Plan Note (Signed)
Follow's with Dr. Rockey Situ. Stable on carvedilol and spironolactone.  Will continue to monitor.

## 2014-10-31 NOTE — Assessment & Plan Note (Signed)
I suspect this is likely due to obesity, lack of energy, and day time tiredness. I recommended she complete a sleep study for further evaluation as she has not had one in a while, nor does she use CPAP. Discussed long term complications of sleep apnea. Will re-address during next visit.

## 2014-10-31 NOTE — Assessment & Plan Note (Signed)
Stable on Topamax, Cymbalta, Concerta, and Abilify. Follow's with Zettie Pho at [facility]. Denies SI/HI.

## 2014-11-01 ENCOUNTER — Telehealth: Payer: Self-pay | Admitting: Primary Care

## 2014-11-01 NOTE — Telephone Encounter (Signed)
Spoke with Katherine [name] regarding lab results and recommended she being a daily iron supplement over the counter. She verbalized understanding. Her WBC count is elevated; however denies any pain, or any symptoms of an illness. Will recheck this, and advised her to notify me if she began to feel ill.

## 2014-11-02 ENCOUNTER — Encounter: Payer: Self-pay | Admitting: Primary Care

## 2014-11-04 ENCOUNTER — Encounter: Payer: Self-pay | Admitting: Primary Care

## 2014-11-04 ENCOUNTER — Ambulatory Visit (INDEPENDENT_AMBULATORY_CARE_PROVIDER_SITE_OTHER): Payer: BC Managed Care – PPO | Admitting: Primary Care

## 2014-11-04 VITALS — BP 126/74 | HR 102 | Temp 97.4°F | Ht 63.0 in | Wt 202.1 lb

## 2014-11-04 DIAGNOSIS — G4733 Obstructive sleep apnea (adult) (pediatric): Secondary | ICD-10-CM

## 2014-11-04 DIAGNOSIS — R5383 Other fatigue: Secondary | ICD-10-CM | POA: Insufficient documentation

## 2014-11-04 DIAGNOSIS — D649 Anemia, unspecified: Secondary | ICD-10-CM | POA: Diagnosis not present

## 2014-11-04 DIAGNOSIS — R5382 Chronic fatigue, unspecified: Secondary | ICD-10-CM | POA: Insufficient documentation

## 2014-11-04 DIAGNOSIS — G479 Sleep disorder, unspecified: Secondary | ICD-10-CM | POA: Diagnosis not present

## 2014-11-04 MED ORDER — TRAZODONE HCL 50 MG PO TABS: ORAL_TABLET | ORAL | Status: DC

## 2014-11-04 NOTE — Patient Instructions (Addendum)
Continue taking your iron supplement daily. Follow up with psychiatry concerning your Concerta as discussed. Complete lab work prior to leaving today. I will notify you of your results. You will be contact about your referral to pulmonology for your sleep study.  Please let us know if you have not heard back within 1 week. Start Trazodone at night to help with sleep. Stop taking if you feel more tired/fatigue during the day. Call me if you develop chest pain, fevers, chills, cough.

## 2014-11-04 NOTE — Assessment & Plan Note (Signed)
Appears to be iron deficiency anemia. Will order iron studies next visit. Started on iron 325mg  OTC. Likely causing her fatigue. CBC and TIBC next visit.

## 2014-11-04 NOTE — Assessment & Plan Note (Signed)
Referral made to pulmonology for sleep study.

## 2014-11-04 NOTE — Assessment & Plan Note (Addendum)
Present for one month. TSH unremarkable; does have anemia and started iron supplement last week. Referral made to pulmonology for sleep study to rule out sleep apnea. ECG today to rule out cardiac involvement and is unchanged from prior ECG in 2014. She also has a follow up appointment in 2 weeks with psychiatry; Concerta could be causing fatigue and tachycardia. Could also be due to restlessness and inability to sleep. RX for Trazodone to help with sleep as she has been on this before with good results. Instructed patient to stop taking if she experienced any additional day time fatigue/drowsiness. Follow up in 5 weeks for re-evaluation.

## 2014-11-04 NOTE — Progress Notes (Signed)
Subjective:    Patient ID: Katherine [name], female    DOB: Nov 14, 1950, 64 y.o.   MRN: [id number]  HPI  Katherine [name] is a 64 year old female who presents today for follow up. She presented last week as a new patient and was noted to have an elevated WBC count upon lab work. She reported to be feeling well at the time but reports to be feeling worse over the weekend.   She presents with a chief complaint of fatigue. She's had a decrease in energy with fatigue starting one month ago. She also reports intermittent lightheadedness/dizziness, headaches, elevated heart rate, laying awake at night unable to sleep, feeling tired throughout the day.  She was taking Trazodone years ago which helped her to sleep. She does occasionally feel like she's going to pass out, but this will dissipate with rest. Denies chest pain, shortness of breath, cough, fevers, chills, abdominal pain.  She was recently noted to have Anemia and started taking an iron supplement last week.  Review of Systems  Constitutional: Positive for fatigue. Negative for fever and chills.  HENT: Negative for congestion, rhinorrhea and sore throat.   Respiratory: Negative for cough.   Gastrointestinal: Negative for nausea, vomiting and abdominal pain.  Neurological: Positive for light-headedness and headaches. Negative for syncope and numbness.  Psychiatric/Behavioral: Positive for sleep disturbance. The patient is nervous/anxious.        Past Medical History  Diagnosis Date  . 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 Hypertension   . Hypokalemia   . Obesity   . Migraine headache   . Depression   . High cholesterol   . Chicken pox     age 32  . Chronic back pain     Compressed discs. [place]  . GERD (gastroesophageal reflux disease)   . Borderline diabetes     History   Social History  . Marital  Status: Married    Spouse Name: N/A  . Number of Children: N/A  . Years of Education: N/A   Occupational History  . Teacher    Social History Main Topics  . Smoking status: Never Smoker   . Smokeless tobacco: Never Used  . Alcohol Use: No  . Drug Use: No  . Sexual Activity:    Partners: Male    Birth Control/ Protection: Post-menopausal   Other Topics Concern  . Not on file   Social History Narrative   Married.   One son is 34, lives in [place].   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   Enjoys substitute teaching, gardening, cooking.    Past Surgical History  Procedure Laterality Date  . Cesarean section    . Foot surgery      bilateral bunions  . Tonsillectomy  1971  . Endometrial ablation  2007  . Bladder suspension  2005    Family History  Problem Relation Age of Onset  . Heart disease Mother   . Hypertension Mother   . Heart failure Mother     CHF  . Diabetes Mother   . Cancer Mother     CERVICAL CANCER  . Heart disease Father   . Diabetes Father   . Liver disease Father   . Hypertension Father   . Diabetes Sister   . Heart disease Brother   . Heart failure Brother     CHF  . Diabetes Brother   .  Cancer Brother 74    THROAT AND NECK  . Cancer Maternal Grandfather     Allergies  Allergen Reactions  . Darvon   . Propoxyphene Hcl   . Codeine Anxiety  . Hydrocodone Anxiety  . Meperidine Anxiety  . Oxycodone Anxiety  . Tramadol Anxiety    Current Outpatient Prescriptions on File Prior to Visit  Medication Sig Dispense Refill  . ARIPiprazole (ABILIFY) 2 MG tablet Take 2 mg by mouth daily.     Marland Kitchen aspirin 81 MG chewable tablet TAKE 1 TABLET (81 MG TOTAL) BY MOUTH DAILY. 30 tablet 1  . carvedilol (COREG) 12.5 MG tablet TAKE 1 TABLET BY MOUTH TWICE A DAY 60 tablet 3  . cholecalciferol (VITAMIN D) 1000 UNITS tablet Take 1,000 Units by mouth daily.      . DULoxetine (CYMBALTA) 60 MG capsule Take 120 mg by mouth every morning.  0  . methylphenidate (CONCERTA)  18 MG CR tablet Take 18 mg by mouth daily.      . pantoprazole (PROTONIX) 40 MG tablet Take 40 mg by mouth daily.     . simvastatin (ZOCOR) 20 MG tablet TAKE 1 TABLET (20 MG TOTAL) BY MOUTH EVERY EVENING. 90 tablet 3  . spironolactone (ALDACTONE) 25 MG tablet TAKE 1 TABLET (25 MG TOTAL) BY MOUTH 2 (TWO) TIMES DAILY. 180 tablet 3  . topiramate (TOPAMAX) 100 MG tablet Take 100 mg by mouth 2 (two) times daily.   1   No current facility-administered medications on file prior to visit.    BP 126/74 mmHg  Pulse 102  Temp(Src) 97.4 F (36.3 C) (Oral)  Ht 5\' 3"  (1.6 m)  Wt 202 lb 1.9 oz (91.681 kg)  BMI 35.81 kg/m2  SpO2 98%    Objective:   Physical Exam  Constitutional: She is oriented to person, place, and time. She appears well-developed.  Does not appear to be acutely ill.  HENT:  Right Ear: External ear normal.  Left Ear: External ear normal.  Nose: Nose normal.  Mouth/Throat: Oropharynx is clear and moist.  Eyes: Pupils are equal, round, and reactive to light.  Neck: Neck supple.  Cardiovascular: Normal rate and regular rhythm.   Normal sinus rhythm at 95  Pulmonary/Chest: Effort normal and breath sounds normal.  Lymphadenopathy:    She has no cervical adenopathy.  Neurological: She is alert and oriented to person, place, and time.  Skin: Skin is warm and dry.  Psychiatric: She has a normal mood and affect.          Assessment & Plan:  EKG:  Sinus rhythm, rate 96, no ST elevation, appears similar to prior ECG in 2014.

## 2014-11-04 NOTE — Progress Notes (Signed)
Pre visit review using our clinic review tool, if applicable. No additional management support is needed unless otherwise documented below in the visit note. 

## 2014-11-05 LAB — CBC WITH DIFFERENTIAL/PLATELET
BASOS PCT: 0.5 % (ref 0.0–3.0)
Basophils Absolute: 0.1 10*3/uL (ref 0.0–0.1)
EOS PCT: 3.5 % (ref 0.0–5.0)
Eosinophils Absolute: 0.4 10*3/uL (ref 0.0–0.7)
HCT: 36.2 % (ref 36.0–46.0)
Hemoglobin: 11.8 g/dL — ABNORMAL LOW (ref 12.0–15.0)
LYMPHS PCT: 19.5 % (ref 12.0–46.0)
Lymphs Abs: 2.5 10*3/uL (ref 0.7–4.0)
MCHC: 32.5 g/dL (ref 30.0–36.0)
MCV: 76.3 fl — ABNORMAL LOW (ref 78.0–100.0)
MONO ABS: 0.7 10*3/uL (ref 0.1–1.0)
MONOS PCT: 5.2 % (ref 3.0–12.0)
NEUTROS PCT: 71.3 % (ref 43.0–77.0)
Neutro Abs: 9.1 10*3/uL — ABNORMAL HIGH (ref 1.4–7.7)
PLATELETS: 387 10*3/uL (ref 150.0–400.0)
RBC: 4.75 Mil/uL (ref 3.87–5.11)
RDW: 18.4 % — ABNORMAL HIGH (ref 11.5–15.5)
WBC: 12.8 10*3/uL — ABNORMAL HIGH (ref 4.0–10.5)

## 2014-12-03 ENCOUNTER — Other Ambulatory Visit: Payer: Self-pay | Admitting: Cardiovascular Disease

## 2014-12-08 ENCOUNTER — Other Ambulatory Visit: Payer: Self-pay | Admitting: Primary Care

## 2014-12-08 DIAGNOSIS — Z0000 Encounter for general adult medical examination without abnormal findings: Secondary | ICD-10-CM

## 2014-12-08 DIAGNOSIS — R7303 Prediabetes: Secondary | ICD-10-CM

## 2014-12-08 DIAGNOSIS — E785 Hyperlipidemia, unspecified: Secondary | ICD-10-CM

## 2014-12-08 DIAGNOSIS — Z780 Asymptomatic menopausal state: Secondary | ICD-10-CM

## 2014-12-08 DIAGNOSIS — I10 Essential (primary) hypertension: Secondary | ICD-10-CM

## 2014-12-09 ENCOUNTER — Other Ambulatory Visit (INDEPENDENT_AMBULATORY_CARE_PROVIDER_SITE_OTHER): Payer: BC Managed Care – PPO

## 2014-12-09 DIAGNOSIS — E785 Hyperlipidemia, unspecified: Secondary | ICD-10-CM | POA: Diagnosis not present

## 2014-12-09 DIAGNOSIS — R7303 Prediabetes: Secondary | ICD-10-CM

## 2014-12-09 DIAGNOSIS — R7309 Other abnormal glucose: Secondary | ICD-10-CM

## 2014-12-09 DIAGNOSIS — Z780 Asymptomatic menopausal state: Secondary | ICD-10-CM

## 2014-12-09 DIAGNOSIS — I10 Essential (primary) hypertension: Secondary | ICD-10-CM

## 2014-12-09 LAB — COMPREHENSIVE METABOLIC PANEL
ALBUMIN: 3.8 g/dL (ref 3.5–5.2)
ALT: 16 U/L (ref 0–35)
AST: 18 U/L (ref 0–37)
Alkaline Phosphatase: 111 U/L (ref 39–117)
BILIRUBIN TOTAL: 0.2 mg/dL (ref 0.2–1.2)
BUN: 12 mg/dL (ref 6–23)
CHLORIDE: 112 meq/L (ref 96–112)
CO2: 23 mEq/L (ref 19–32)
Calcium: 8.9 mg/dL (ref 8.4–10.5)
Creatinine, Ser: 0.85 mg/dL (ref 0.40–1.20)
GFR: 71.76 mL/min (ref >60.00)
Glucose, Bld: 126 mg/dL — ABNORMAL HIGH (ref 70–99)
POTASSIUM: 3.9 meq/L (ref 3.5–5.1)
Sodium: 140 mEq/L (ref 135–145)
TOTAL PROTEIN: 6.4 g/dL (ref 6.0–8.3)

## 2014-12-09 LAB — HEMOGLOBIN A1C: HEMOGLOBIN A1C: 5.9 % (ref 4.6–6.5)

## 2014-12-09 LAB — CBC WITH DIFFERENTIAL/PLATELET
Basophils Absolute: 0 10*3/uL (ref 0.0–0.1)
Basophils Relative: 0.5 % (ref 0.0–3.0)
EOS ABS: 0.3 10*3/uL (ref 0.0–0.7)
Eosinophils Relative: 4.4 % (ref 0.0–5.0)
HEMATOCRIT: 38 % (ref 36.0–46.0)
HEMOGLOBIN: 12.4 g/dL (ref 12.0–15.0)
Lymphocytes Relative: 22.9 % (ref 12.0–46.0)
Lymphs Abs: 1.8 10*3/uL (ref 0.7–4.0)
MCHC: 32.6 g/dL (ref 30.0–36.0)
MCV: 79.8 fl (ref 78.0–100.0)
MONOS PCT: 6.6 % (ref 3.0–12.0)
Monocytes Absolute: 0.5 10*3/uL (ref 0.1–1.0)
Neutro Abs: 5.1 10*3/uL (ref 1.4–7.7)
Neutrophils Relative %: 65.6 % (ref 43.0–77.0)
Platelets: 311 10*3/uL (ref 150.0–400.0)
RBC: 4.76 Mil/uL (ref 3.87–5.11)
RDW: 20.5 % — ABNORMAL HIGH (ref 11.5–15.5)
WBC: 7.8 10*3/uL (ref 4.0–10.5)

## 2014-12-09 LAB — LIPID PANEL
Cholesterol: 130 mg/dL (ref 0–200)
HDL: 53.6 mg/dL (ref >39.00)
LDL Cholesterol: 40 mg/dL (ref 0–99)
NonHDL: 76.4
TRIGLYCERIDES: 180 mg/dL — AB (ref 0.0–149.0)
Total CHOL/HDL Ratio: 2
VLDL: 36 mg/dL (ref 0.0–40.0)

## 2014-12-09 LAB — VITAMIN D 25 HYDROXY (VIT D DEFICIENCY, FRACTURES): VITD: 24.96 ng/mL — ABNORMAL LOW (ref 30.00–100.00)

## 2014-12-10 ENCOUNTER — Ambulatory Visit (INDEPENDENT_AMBULATORY_CARE_PROVIDER_SITE_OTHER): Payer: BC Managed Care – PPO | Admitting: Primary Care

## 2014-12-10 ENCOUNTER — Ambulatory Visit: Payer: BC Managed Care – PPO | Admitting: Primary Care

## 2014-12-10 ENCOUNTER — Encounter: Payer: Self-pay | Admitting: Primary Care

## 2014-12-10 VITALS — BP 122/72 | HR 95 | Temp 98.4°F | Ht 63.0 in | Wt 206.8 lb

## 2014-12-10 DIAGNOSIS — Z0000 Encounter for general adult medical examination without abnormal findings: Secondary | ICD-10-CM | POA: Diagnosis not present

## 2014-12-10 DIAGNOSIS — E785 Hyperlipidemia, unspecified: Secondary | ICD-10-CM

## 2014-12-10 DIAGNOSIS — G4733 Obstructive sleep apnea (adult) (pediatric): Secondary | ICD-10-CM

## 2014-12-10 DIAGNOSIS — F32A Depression, unspecified: Secondary | ICD-10-CM

## 2014-12-10 DIAGNOSIS — E669 Obesity, unspecified: Secondary | ICD-10-CM

## 2014-12-10 DIAGNOSIS — Z114 Encounter for screening for human immunodeficiency virus [HIV]: Secondary | ICD-10-CM | POA: Insufficient documentation

## 2014-12-10 DIAGNOSIS — R7309 Other abnormal glucose: Secondary | ICD-10-CM | POA: Diagnosis not present

## 2014-12-10 DIAGNOSIS — D649 Anemia, unspecified: Secondary | ICD-10-CM

## 2014-12-10 DIAGNOSIS — K219 Gastro-esophageal reflux disease without esophagitis: Secondary | ICD-10-CM

## 2014-12-10 DIAGNOSIS — R7303 Prediabetes: Secondary | ICD-10-CM

## 2014-12-10 DIAGNOSIS — F329 Major depressive disorder, single episode, unspecified: Secondary | ICD-10-CM

## 2014-12-10 DIAGNOSIS — R5383 Other fatigue: Secondary | ICD-10-CM

## 2014-12-10 DIAGNOSIS — Z23 Encounter for immunization: Secondary | ICD-10-CM | POA: Diagnosis not present

## 2014-12-10 DIAGNOSIS — E559 Vitamin D deficiency, unspecified: Secondary | ICD-10-CM

## 2014-12-10 DIAGNOSIS — I10 Essential (primary) hypertension: Secondary | ICD-10-CM

## 2014-12-10 MED ORDER — LIRAGLUTIDE 18 MG/3ML ~~LOC~~ SOPN: 0.6000 mg | PEN_INJECTOR | Freq: Every day | SUBCUTANEOUS | Status: DC

## 2014-12-10 MED ORDER — VITAMIN D (ERGOCALCIFEROL) 1.25 MG (50000 UNIT) PO CAPS: ORAL_CAPSULE | ORAL | Status: DC

## 2014-12-10 MED ORDER — PANTOPRAZOLE SODIUM 20 MG PO TBEC: 20.0000 mg | DELAYED_RELEASE_TABLET | Freq: Every day | ORAL | Status: DC

## 2014-12-10 NOTE — Assessment & Plan Note (Signed)
Stable. Managed by Lattie Haw. Denies SI/HI. Feels well managed.

## 2014-12-10 NOTE — Assessment & Plan Note (Signed)
Diet mostly healthy but will drink sweet tea, eat sweets, and some starches. She has attempted to lose weight in the past through diet and is very frustraed with her lack of succcess. She is too tired to exercise. Given the borderline diabetes and request for weight loss, will start Victoza 0.6 mg daily. Education provided on importance of diet, and s/s of hypoglycemia. She verbalized understanding. Follow up in 1 month for re-evaluation.

## 2014-12-10 NOTE — Progress Notes (Signed)
Pre visit review using our clinic review tool, if applicable. No additional management support is needed unless otherwise documented below in the visit note. 

## 2014-12-10 NOTE — Progress Notes (Signed)
Subjective:    Patient ID: Katherine [name], female    DOB: May 26, 1951, 64 y.o.   MRN: [id number]  HPI  Katherine [name] is a 64 year old female who presents today for complete physical.  Immunizations: -Tetanus: 10 years ago. -Influenza: Obtained last season -Pneumonia: Has not had. Due today. -Shingles: Completed in 2013.  Diet: Diet consists of fresh vegetables, sweet potatoes, lean meats. She does eat sweets. No fast food. Drinks mostly 1/2 sweet/unsweet tea, water, sparkling water. No sodas. She admits to small portion sizes.  Exercise: Does not exercise because she does not exercise. Eye exam: Last exam Spring 2016 with glaucoma testing. Dental exam: Completed last week. Colonoscopy: Completed in 2010, benign. Dexa: Completed 2 years ago. Showed osteoporosis. She took Fosamax which upset her stomach.  Pap Smear: Completed in Fall of 2015. Mammogram: Last completed in 2 years  Wt Readings from Last 3 Encounters:  12/10/14 206 lb 12.8 oz (93.804 kg)  11/04/14 202 lb 1.9 oz (91.681 kg)  10/30/14 203 lb 12.8 oz (92.443 kg)    1) OSA: Scheduled for sleep study evaluation in June 2016.  Review of Systems  Constitutional: Positive for fatigue. Negative for unexpected weight change.  HENT: Negative for rhinorrhea.   Respiratory: Negative for cough and shortness of breath.   Cardiovascular: Negative for chest pain.  Gastrointestinal: Negative for diarrhea and constipation.  Genitourinary: Negative for dysuria and frequency.  Musculoskeletal: Negative for myalgias and arthralgias.       Chronic back pain. Follows with Katherine [name] for injections.  Skin: Negative for rash.  Neurological: Negative for dizziness and headaches.  Psychiatric/Behavioral:       Follows with with Katherine [name].        Past Medical History  Diagnosis Date  . 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  Hypertension   . Hypokalemia   . Obesity   . Migraine headache   . Depression   . High cholesterol   . Chicken pox     age 62  . Chronic back pain     Compressed discs. [place]  . GERD (gastroesophageal reflux disease)   . Borderline diabetes     History   Social History  . Marital Status: Married    Spouse Name: N/A  . Number of Children: N/A  . Years of Education: N/A   Occupational History  . Teacher    Social History Main Topics  . Smoking status: Never Smoker   . Smokeless tobacco: Never Used  . Alcohol Use: No  . Drug Use: No  . Sexual Activity:    Partners: Male    Birth Control/ Protection: Post-menopausal   Other Topics Concern  . Not on file   Social History Narrative   Married.   One son is 27, lives in [place].   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   Enjoys substitute teaching, gardening, cooking.    Past Surgical History  Procedure Laterality Date  . Cesarean section    . Foot surgery      bilateral bunions  . Tonsillectomy  1971  . Endometrial ablation  2007  . Bladder suspension  2005    Family History  Problem Relation Age of Onset  . Heart disease Mother   . Hypertension Mother   . Heart failure Mother     CHF  . Diabetes Mother   .  Cancer Mother     CERVICAL CANCER  . Heart disease Father   . Diabetes Father   . Liver disease Father   . Hypertension Father   . Diabetes Sister   . Heart disease Brother   . Heart failure Brother     CHF  . Diabetes Brother   . Cancer Brother 20    THROAT AND NECK  . Cancer Maternal Grandfather     Allergies  Allergen Reactions  . Darvon   . Propoxyphene Hcl   . Codeine Anxiety  . Hydrocodone Anxiety  . Meperidine Anxiety  . Oxycodone Anxiety  . Tramadol Anxiety    Current Outpatient Prescriptions on File Prior to Visit  Medication Sig Dispense Refill  . ARIPiprazole (ABILIFY) 2 MG tablet Take 2 mg by mouth daily.     Marland Kitchen aspirin 81 MG chewable tablet TAKE 1  TABLET (81 MG TOTAL) BY MOUTH DAILY. 30 tablet 1  . carvedilol (COREG) 12.5 MG tablet TAKE 1 TABLET BY MOUTH TWICE A DAY 60 tablet 3  . cholecalciferol (VITAMIN D) 1000 UNITS tablet Take 1,000 Units by mouth daily.      . DULoxetine (CYMBALTA) 60 MG capsule Take 120 mg by mouth every morning.  0  . methylphenidate (CONCERTA) 18 MG CR tablet Take 18 mg by mouth daily.      . simvastatin (ZOCOR) 20 MG tablet TAKE 1 TABLET (20 MG TOTAL) BY MOUTH EVERY EVENING. 90 tablet 3  . simvastatin (ZOCOR) 20 MG tablet TAKE 1 TABLET (20 MG TOTAL) BY MOUTH EVERY EVENING. 90 tablet 3  . spironolactone (ALDACTONE) 25 MG tablet TAKE 1 TABLET (25 MG TOTAL) BY MOUTH 2 (TWO) TIMES DAILY. 180 tablet 3  . topiramate (TOPAMAX) 100 MG tablet Take 100 mg by mouth 2 (two) times daily.   1  . traZODone (DESYREL) 50 MG tablet Take one tablet by mouth at bedtime for sleep. 30 tablet 2  . ferrous sulfate 325 (65 FE) MG tablet Take 325 mg by mouth daily with breakfast.     No current facility-administered medications on file prior to visit.    BP 122/72 mmHg  Pulse 95  Temp(Src) 98.4 F (36.9 C) (Oral)  Ht 5\' 3"  (1.6 m)  Wt 206 lb 12.8 oz (93.804 kg)  BMI 36.64 kg/m2  SpO2 97%    Objective:   Physical Exam  Constitutional: She is oriented to person, place, and time. She appears well-nourished.  HENT:  Right Ear: Tympanic membrane and ear canal normal.  Left Ear: Tympanic membrane and ear canal normal.  Nose: Nose normal.  Mouth/Throat: Oropharynx is clear and moist.  Eyes: Conjunctivae are normal. Pupils are equal, round, and reactive to light.  Neck: Neck supple. No thyromegaly present.  Cardiovascular: Normal rate and regular rhythm.   Pulmonary/Chest: Effort normal and breath sounds normal. Right breast exhibits no mass, no skin change and no tenderness. Left breast exhibits no mass, no skin change and no tenderness.  Abdominal: Soft. Bowel sounds are normal. She exhibits no mass. There is no tenderness.    Musculoskeletal: She exhibits no edema.  Lymphadenopathy:    She has no cervical adenopathy.  Neurological: She is alert and oriented to person, place, and time. She has normal reflexes. No cranial nerve deficit. Coordination normal.  Skin: Skin is warm and dry.  Psychiatric: She has a normal mood and affect.          Assessment & Plan:

## 2014-12-10 NOTE — Patient Instructions (Addendum)
Keep appointment for sleep study in June.  Start Victoza (liraglutide) injection. You will inject 0.6 mg (0.34ml) into the stomach once daily for weight loss and diabetes prevention. Please call me if you start to feel dizzy or weak shortly after injecting this medication.  Start Vitamin D capsules. Take 1 capsule by mouth once weekly for 12 weeks. We will recheck your levels at that point. It is important that you improve your diet. Please limit carbohydrates in the form of white bread, rice, pasta, cakes, cookies, sugary drinks, etc. Increase your consumption of fresh fruits and vegetables. Be sure to drink plenty of water daily.  Follow up in one month for re-evaluation of weight loss efforts.

## 2014-12-10 NOTE — Assessment & Plan Note (Signed)
Stable. Follows with Dr. Rockey Situ. Continue same.

## 2014-12-10 NOTE — Assessment & Plan Note (Addendum)
Tetanus and pneumonia due today, will administer Td and pneumovax. Shingles completed in 2013. Colonscopy due in 2020. Dexa completed 1.5 years ago. Due this summer. Diet is fair, discussed importance of healthy diet and exercise. Due for mammogram, sheet provided with numbers for patient to schedule.

## 2014-12-10 NOTE — Assessment & Plan Note (Signed)
Stable per recent labs. Continue statin.

## 2014-12-10 NOTE — Assessment & Plan Note (Signed)
Improved. A1C of 5.9 in May 2016. Diet mostly healthy but will drink sweet tea, eat sweets, and some starches. She has attempted to lose weight in the past through diet and is very frustraed with her lack of succcess. She is too tired to exercise. Given the borderline diabetes and request for weight loss, will start Victoza 0.6 mg daily. Education provided on importance of diet, and s/s of hypoglycemia. She verbalized understanding. Follow up in 1 month for re-evaluation.

## 2014-12-10 NOTE — Assessment & Plan Note (Signed)
Unremarkable in recent labs. Will continue to monitor. Continue iron supplement.

## 2014-12-10 NOTE — Assessment & Plan Note (Signed)
Due for sleep evaluation in June 2016.

## 2014-12-10 NOTE — Assessment & Plan Note (Signed)
Sleep study scheduled for June

## 2014-12-16 ENCOUNTER — Encounter: Payer: Self-pay | Admitting: Primary Care

## 2014-12-20 ENCOUNTER — Encounter: Payer: Self-pay | Admitting: Primary Care

## 2014-12-20 ENCOUNTER — Ambulatory Visit (INDEPENDENT_AMBULATORY_CARE_PROVIDER_SITE_OTHER): Payer: BC Managed Care – PPO | Admitting: Primary Care

## 2014-12-20 VITALS — BP 128/88 | HR 108 | Temp 97.5°F | Ht 63.0 in | Wt 196.1 lb

## 2014-12-20 DIAGNOSIS — R112 Nausea with vomiting, unspecified: Secondary | ICD-10-CM

## 2014-12-20 MED ORDER — ONDANSETRON 4 MG PO TBDP: 4.0000 mg | ORAL_TABLET | Freq: Three times a day (TID) | ORAL | Status: DC | PRN

## 2014-12-20 NOTE — Progress Notes (Addendum)
Subjective:    Patient ID: Katherine [name], female    DOB: Mar 20, 1951, 64 y.o.   MRN: [id number]  HPI  Katherine [name] is a 63 year old female who presents today with a chief complaint of nausea. Her nausea has been present for over one week. Her appetite has been decreased over the past week and has hardly eaten. Last night she ate a piece of grilled chicken which was the first solid food she's had all week. She also reports 1 episode of diarrhea and 1 episose of vomiting this morning at 5am. Throughout last week she's been drinking ginger ale and eating crackers. Denies fevers, cough, sore throat. She currently does not feel nauseated and is feeling well overall. She started taking the Victoza last Wednesday prior to these symptoms which developed Thursday.   Review of Systems  HENT: Negative for congestion, ear pain and sore throat.   Respiratory: Negative for cough and shortness of breath.   Cardiovascular: Negative for chest pain.  Gastrointestinal: Positive for nausea, vomiting and diarrhea. Negative for abdominal pain.  Musculoskeletal: Negative for myalgias.  Neurological: Negative for dizziness.       Past Medical History  Diagnosis Date  . 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 Hypertension   . Hypokalemia   . Obesity   . Migraine headache   . Depression   . High cholesterol   . Chicken pox     age 42  . Chronic back pain     Compressed discs. [place]  . GERD (gastroesophageal reflux disease)   . Borderline diabetes     History   Social History  . Marital Status: Married    Spouse Name: N/A  . Number of Children: N/A  . Years of Education: N/A   Occupational History  . Teacher    Social History Main Topics  . Smoking status: Never Smoker   . Smokeless tobacco: Never Used  . Alcohol Use: No  . Drug Use: No  . Sexual Activity:   Partners: Male    Birth Control/ Protection: Post-menopausal   Other Topics Concern  . Not on file   Social History Narrative   Married.   One son is 58, lives in [place].   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   Enjoys substitute teaching, gardening, cooking.    Past Surgical History  Procedure Laterality Date  . Cesarean section    . Foot surgery      bilateral bunions  . Tonsillectomy  1971  . Endometrial ablation  2007  . Bladder suspension  2005    Family History  Problem Relation Age of Onset  . Heart disease Mother   . Hypertension Mother   . Heart failure Mother     CHF  . Diabetes Mother   . Cancer Mother     CERVICAL CANCER  . Heart disease Father   . Diabetes Father   . Liver disease Father   . Hypertension Father   . Diabetes Sister   . Heart disease Brother   . Heart failure Brother     CHF  . Diabetes Brother   . Cancer Brother 16    THROAT AND NECK  . Cancer Maternal Grandfather     Allergies  Allergen Reactions  . Darvon   . Propoxyphene Hcl   . Codeine Anxiety  . Hydrocodone Anxiety  . Meperidine Anxiety  .  Oxycodone Anxiety  . Tramadol Anxiety    Current Outpatient Prescriptions on File Prior to Visit  Medication Sig Dispense Refill  . ARIPiprazole (ABILIFY) 2 MG tablet Take 2 mg by mouth daily.     Marland Kitchen aspirin 81 MG chewable tablet TAKE 1 TABLET (81 MG TOTAL) BY MOUTH DAILY. 30 tablet 1  . carvedilol (COREG) 12.5 MG tablet TAKE 1 TABLET BY MOUTH TWICE A DAY 60 tablet 3  . cholecalciferol (VITAMIN D) 1000 UNITS tablet Take 1,000 Units by mouth daily.      . DULoxetine (CYMBALTA) 60 MG capsule Take 120 mg by mouth every morning.  0  . ferrous sulfate 325 (65 FE) MG tablet Take 325 mg by mouth daily with breakfast.    . Liraglutide 18 MG/3ML SOPN Inject 0.1 mLs (0.6 mg total) into the skin daily. 6 mL 3  . methylphenidate (CONCERTA) 18 MG CR tablet Take 18 mg by mouth daily.      . pantoprazole (PROTONIX) 20 MG tablet Take 1 tablet (20 mg total)  by mouth daily. 30 tablet 11  . simvastatin (ZOCOR) 20 MG tablet TAKE 1 TABLET (20 MG TOTAL) BY MOUTH EVERY EVENING. 90 tablet 3  . simvastatin (ZOCOR) 20 MG tablet TAKE 1 TABLET (20 MG TOTAL) BY MOUTH EVERY EVENING. 90 tablet 3  . spironolactone (ALDACTONE) 25 MG tablet TAKE 1 TABLET (25 MG TOTAL) BY MOUTH 2 (TWO) TIMES DAILY. 180 tablet 3  . topiramate (TOPAMAX) 100 MG tablet Take 100 mg by mouth 2 (two) times daily.   1  . traZODone (DESYREL) 50 MG tablet Take one tablet by mouth at bedtime for sleep. 30 tablet 2  . Vitamin D, Ergocalciferol, (DRISDOL) 50000 UNITS CAPS capsule Take 1 capsule by mouth once a week for 12 weeks. 4 capsule 3   No current facility-administered medications on file prior to visit.    BP 128/88 mmHg  Pulse 108  Temp(Src) 97.5 F (36.4 C) (Oral)  Ht 5\' 3"  (1.6 m)  Wt 196 lb 1.9 oz (88.959 kg)  BMI 34.75 kg/m2  SpO2 98%    Objective:   Physical Exam  Constitutional: She is oriented to person, place, and time.  HENT:  Right Ear: Tympanic membrane and ear canal normal.  Left Ear: Tympanic membrane and ear canal normal.  Mouth/Throat: Oropharynx is clear and moist.  Eyes: Conjunctivae are normal. Pupils are equal, round, and reactive to light.  Neck: Neck supple.  Cardiovascular: Normal rate and regular rhythm.   Pulmonary/Chest: Effort normal and breath sounds normal.  Abdominal: Soft. Bowel sounds are normal. There is no tenderness.  Lymphadenopathy:    She has no cervical adenopathy.  Neurological: She is alert and oriented to person, place, and time.  Skin: Skin is warm and dry.          Assessment & Plan:  Nausea:  Present for one week with weakness and fatigue. One episode of vomiting and diarrhea this morning. Overall she's feeling better after she vomited this morning No fevers, chills, body aches, abdominal pain. Push water intake, low calorie gatorade. RX for zofran for nausea. May use immodium OTC if diarrhea persists. Restart  Victoza. If symptoms restart after initiation of Victoza then will discontinue as this medication may cause those exact symptoms.  Call with an update next week.

## 2014-12-20 NOTE — Patient Instructions (Addendum)
Your symptoms are likely related to a viral infection.  You need to increase your intake of water. You may also drink diet ginger ale and low calorie Gatorade to help replenish your electrolytes. You may take imodium over the counter for any diarrhea. You may take the Zofran every 8 hours as needed for nausea. Start back eating bland foods. Restart the Victoza today. Call me next week with an update. It was nice to see you!  Food Choices to Help Relieve Diarrhea When you have diarrhea, the foods you eat and your eating habits are very important. Choosing the right foods and drinks can help relieve diarrhea. Also, because diarrhea can last up to 7 days, you need to replace lost fluids and electrolytes (such as sodium, potassium, and chloride) in order to help prevent dehydration.  WHAT GENERAL GUIDELINES DO I NEED TO FOLLOW?  Slowly drink 1 cup (8 oz) of fluid for each episode of diarrhea. If you are getting enough fluid, your urine will be clear or pale yellow.  Eat starchy foods. Some good choices include white rice, white toast, pasta, low-fiber cereal, baked potatoes (without the skin), saltine crackers, and bagels.  Avoid large servings of any cooked vegetables.  Limit fruit to two servings per day. A serving is  cup or 1 small piece.  Choose foods with less than 2 g of fiber per serving.  Limit fats to less than 8 tsp (38 g) per day.  Avoid fried foods.  Eat foods that have probiotics in them. Probiotics can be found in certain dairy products.  Avoid foods and beverages that may increase the speed at which food moves through the stomach and intestines (gastrointestinal tract). Things to avoid include:  High-fiber foods, such as dried fruit, raw fruits and vegetables, nuts, seeds, and whole grain foods.  Spicy foods and high-fat foods.  Foods and beverages sweetened with high-fructose corn syrup, honey, or sugar alcohols such as xylitol, sorbitol, and mannitol. WHAT FOODS ARE  RECOMMENDED? Grains White rice. White, [place], or pita breads (fresh or toasted), including plain rolls, buns, or bagels. White pasta. Saltine, soda, or graham crackers. Pretzels. Low-fiber cereal. Cooked cereals made with water (such as cornmeal, farina, or cream cereals). Plain muffins. Matzo. Melba toast. Zwieback.  Vegetables Potatoes (without the skin). Strained tomato and vegetable juices. Most well-cooked and canned vegetables without seeds. Tender lettuce. Fruits Cooked or canned applesauce, apricots, cherries, fruit cocktail, grapefruit, peaches, pears, or plums. Fresh bananas, apples without skin, cherries, grapes, cantaloupe, grapefruit, peaches, oranges, or plums.  Meat and Other Protein Products Baked or boiled chicken. Eggs. Tofu. Fish. Seafood. Smooth peanut butter. Ground or well-cooked tender beef, ham, veal, lamb, pork, or poultry.  Dairy Plain yogurt, kefir, and unsweetened liquid yogurt. Lactose-free milk, buttermilk, or soy milk. Plain hard cheese. Beverages Sport drinks. Clear broths. Diluted fruit juices (except prune). Regular, caffeine-free sodas such as ginger ale. Water. Decaffeinated teas. Oral rehydration solutions. Sugar-free beverages not sweetened with sugar alcohols. Other Bouillon, broth, or soups made from recommended foods.  The items listed above may not be a complete list of recommended foods or beverages. Contact your dietitian for more options. WHAT FOODS ARE NOT RECOMMENDED? Grains Whole grain, whole wheat, bran, or rye breads, rolls, pastas, crackers, and cereals. Wild or brown rice. Cereals that contain more than 2 g of fiber per serving. Corn tortillas or taco shells. Cooked or dry oatmeal. Granola. Popcorn. Vegetables Raw vegetables. Cabbage, broccoli, Brussels sprouts, artichokes, baked beans, beet greens, corn, kale, legumes,  peas, sweet potatoes, and yams. Potato skins. Cooked spinach and cabbage. Fruits Dried fruit, including raisins and dates.  Raw fruits. Stewed or dried prunes. Fresh apples with skin, apricots, mangoes, pears, raspberries, and strawberries.  Meat and Other Protein Products Chunky peanut butter. Nuts and seeds. Beans and lentils. Berniece Salines.  Dairy High-fat cheeses. Milk, chocolate milk, and beverages made with milk, such as milk shakes. Cream. Ice cream. Sweets and Desserts Sweet rolls, doughnuts, and sweet breads. Pancakes and waffles. Fats and Oils Butter. Cream sauces. Margarine. Salad oils. Plain salad dressings. Olives. Avocados.  Beverages Caffeinated beverages (such as coffee, tea, soda, or energy drinks). Alcoholic beverages. Fruit juices with pulp. Prune juice. Soft drinks sweetened with high-fructose corn syrup or sugar alcohols. Other Coconut. Hot sauce. Chili powder. Mayonnaise. Gravy. Cream-based or milk-based soups.  The items listed above may not be a complete list of foods and beverages to avoid. Contact your dietitian for more information. WHAT SHOULD I DO IF I BECOME DEHYDRATED? Diarrhea can sometimes lead to dehydration. Signs of dehydration include dark urine and dry mouth and skin. If you think you are dehydrated, you should rehydrate with an oral rehydration solution. These solutions can be purchased at pharmacies, retail stores, or online.  Drink -1 cup (120-240 mL) of oral rehydration solution each time you have an episode of diarrhea. If drinking this amount makes your diarrhea worse, try drinking smaller amounts more often. For example, drink 1-3 tsp (5-15 mL) every 5-10 minutes.  A general rule for staying hydrated is to drink 1-2 L of fluid per day. Talk to your health care provider about the specific amount you should be drinking each day. Drink enough fluids to keep your urine clear or pale yellow. Document Released: 09/25/2003 Document Revised: 07/10/2013 Document Reviewed: 05/28/2013 [facility] Patient Information 2015 [place], [place]. This information is not intended to replace advice given  to you by your health care provider. Make sure you discuss any questions you have with your health care provider.

## 2014-12-20 NOTE — Progress Notes (Signed)
Pre visit review using our clinic review tool, if applicable. No additional management support is needed unless otherwise documented below in the visit note. 

## 2014-12-25 ENCOUNTER — Telehealth: Payer: Self-pay

## 2014-12-25 ENCOUNTER — Institutional Professional Consult (permissible substitution): Payer: BC Managed Care – PPO | Admitting: Pulmonary Disease

## 2014-12-25 NOTE — Telephone Encounter (Signed)
Pt left v/m; pt seen 12/20/2014; pt restarted Victoza and having nausea and weakness again. Pt wants to know if should stop med and get different med. Pt [facility] cb. [facility].

## 2014-12-25 NOTE — Telephone Encounter (Signed)
Advised patient to stop this medication. She verbalized understanding.

## 2015-01-03 ENCOUNTER — Ambulatory Visit: Payer: BC Managed Care – PPO | Admitting: Family Medicine

## 2015-01-10 ENCOUNTER — Ambulatory Visit: Payer: BC Managed Care – PPO | Admitting: Primary Care

## 2015-01-15 ENCOUNTER — Telehealth: Payer: Self-pay | Admitting: Primary Care

## 2015-01-15 ENCOUNTER — Ambulatory Visit: Payer: BC Managed Care – PPO | Admitting: Cardiovascular Disease

## 2015-01-27 ENCOUNTER — Ambulatory Visit: Payer: BC Managed Care – PPO | Admitting: Primary Care

## 2015-03-08 ENCOUNTER — Other Ambulatory Visit: Payer: Self-pay | Admitting: Cardiovascular Disease

## 2015-03-13 ENCOUNTER — Ambulatory Visit (INDEPENDENT_AMBULATORY_CARE_PROVIDER_SITE_OTHER): Payer: BC Managed Care – PPO | Admitting: Primary Care

## 2015-03-13 ENCOUNTER — Telehealth: Payer: Self-pay | Admitting: Primary Care

## 2015-03-13 ENCOUNTER — Encounter: Payer: Self-pay | Admitting: Primary Care

## 2015-03-13 VITALS — BP 126/86 | HR 93 | Temp 98.5°F | Ht 63.0 in | Wt 207.4 lb

## 2015-03-13 DIAGNOSIS — R252 Cramp and spasm: Secondary | ICD-10-CM

## 2015-03-13 DIAGNOSIS — E669 Obesity, unspecified: Secondary | ICD-10-CM

## 2015-03-13 DIAGNOSIS — R5383 Other fatigue: Secondary | ICD-10-CM | POA: Diagnosis not present

## 2015-03-13 DIAGNOSIS — R7309 Other abnormal glucose: Secondary | ICD-10-CM

## 2015-03-13 DIAGNOSIS — G2581 Restless legs syndrome: Secondary | ICD-10-CM

## 2015-03-13 DIAGNOSIS — R7303 Prediabetes: Secondary | ICD-10-CM

## 2015-03-13 DIAGNOSIS — N644 Mastodynia: Secondary | ICD-10-CM | POA: Diagnosis not present

## 2015-03-13 DIAGNOSIS — E559 Vitamin D deficiency, unspecified: Secondary | ICD-10-CM | POA: Diagnosis not present

## 2015-03-13 LAB — VITAMIN D 25 HYDROXY (VIT D DEFICIENCY, FRACTURES): VITD: 41.16 ng/mL (ref 30.00–100.00)

## 2015-03-13 LAB — VITAMIN B12: VITAMIN B 12: 308 pg/mL (ref 211–911)

## 2015-03-13 LAB — CBC
HCT: 40.4 % (ref 36.0–46.0)
HEMOGLOBIN: 12.9 g/dL (ref 12.0–15.0)
MCHC: 32 g/dL (ref 30.0–36.0)
MCV: 83.5 fl (ref 78.0–100.0)
Platelets: 354 10*3/uL (ref 150.0–400.0)
RBC: 4.84 Mil/uL (ref 3.87–5.11)
RDW: 15.6 % — AB (ref 11.5–15.5)
WBC: 11.4 10*3/uL — AB (ref 4.0–10.5)

## 2015-03-13 MED ORDER — ROPINIROLE HCL 0.25 MG PO TABS: 0.2500 mg | ORAL_TABLET | Freq: Every day | ORAL | Status: DC

## 2015-03-13 NOTE — Telephone Encounter (Signed)
Noted and sounds great. Thank you!

## 2015-03-13 NOTE — Assessment & Plan Note (Signed)
Weight gain of 11 pounds since last visit. Endorses healthy diet, does not exercise. She would like to start on weight loss pills. Due to consistent elevation in HR (likely from psych meds) I do not believe this is a good plan for her. Resources provided, offered referral to nutritionist. She will visit the bariatric center in [place].

## 2015-03-13 NOTE — Assessment & Plan Note (Signed)
Bilateral lower extremity cramping, improved with leg movement, occurs only when laying in bed. Will trial Requip for symptoms. BMP to rule out electrolyte imbalance.

## 2015-03-13 NOTE — Patient Instructions (Addendum)
Complete lab work prior to leaving today. I will notify you of your results.  Stop by the front and schedule your mammogram with [facility].  Start Ropinirole tablets for restless leg syndrome. Take 1 tablet by mouth 1-2 hours prior to bed.  Talk with Lattie Haw regarding your Concerta and Abilify.  Please send me a message on My Chart with an update on your legs.   You may go to the [facility] on [address]. I can also send you to a nutritionist.  Follow up in 3 months for re-evaluation.  It was a pleasure to see you today!  Restless Legs Syndrome Restless legs syndrome is a movement disorder. It may also be called a sensorimotor disorder.  CAUSES  No one knows what specifically causes restless legs syndrome, but it tends to run in families. It is also more common in people with low iron, in pregnancy, in people who need dialysis, and those with nerve damage (neuropathy).Some medications may make restless legs syndrome worse.Those medications include drugs to treat high blood pressure, some heart conditions, nausea, colds, allergies, and depression. SYMPTOMS Symptoms include uncomfortable sensations in the legs. These leg sensations are worse during periods of inactivity or rest. They are also worse while sitting or lying down. Individuals that have the disorder describe sensations in the legs that feel like:  Pulling.  Drawing.  Crawling.  Worming.  Boring.  Tingling.  Pins and needles.  Prickling.  Pain. The sensations are usually accompanied by an overwhelming urge to move the legs. Sudden muscle jerks may also occur. Movement provides temporary relief from the discomfort. In rare cases, the arms may also be affected. Symptoms may interfere with going to sleep (sleep onset insomnia). Restless legs syndrome may also be related to periodic limb movement disorder (PLMD). PLMD is another more common motor disorder. It also causes interrupted sleep. The  symptoms from PLMD usually occur most often when you are awake. TREATMENT  Treatment for restless legs syndrome is symptomatic. This means that the symptoms are treated.   Massage and cold compresses may provide temporary relief.  Walk, stretch, or take a cold or hot bath.  Get regular exercise and a good night's sleep.  Avoid caffeine, alcohol, nicotine, and medications that can make it worse.  Do activities that provide mental stimulation like discussions, needlework, and video games. These may be helpful if you are not able to walk or stretch. Some medications are effective in relieving the symptoms. However, many of these medications have side effects. Ask your caregiver about medications that may help your symptoms. Correcting iron deficiency may improve symptoms for some patients. Document Released: 06/25/2002 Document Revised: 11/19/2013 Document Reviewed: 10/01/2010 [facility] Patient Information 2015 [place], [place]. This information is not intended to replace advice given to you by your health care provider. Make sure you discuss any questions you have with your health care provider.

## 2015-03-13 NOTE — Assessment & Plan Note (Signed)
Present for months, seems more upset about weight. Denies symptoms of depression. CBC, Vitamin B12, and Vitamin D checked today and are stable. Will continue to monitor, encouraged resources for weight loss.

## 2015-03-13 NOTE — Progress Notes (Signed)
Pre visit review using our clinic review tool, if applicable. No additional management support is needed unless otherwise documented below in the visit note. 

## 2015-03-13 NOTE — Progress Notes (Signed)
Subjective:    Patient ID: Katherine [name], female    DOB: 12-Aug-1950, 64 y.o.   MRN: [id number]  HPI  [name] is a 64 year old female who presents today with a multiple complaints.   1) Fatigue: She continues to feel a lack of energy since our evaluation months ago. She doesn't have the energy to do the things she enjoys doing. Denies bleeding, dizziness, chest pain. Denies symptoms of depression.  2) Leg cramping: Present bilaterally, cramping has been present for the past 4 weeks and is present only when laying in bed.. She will have to move them around to decrease the pain. She will take Naproxen which will reduce her pain and allow her legs to rest. Denies swelling, long travel, being sedentary.  3) Obesity: She has tried to lose weight through improvement in diet over the past several months. She was trialed on Victoza which she could not tolerate due to side effects.  Diet currently consists of: Breakfast: Hard boiled egg, english muffin, unsweet tea. Lunch: Skips, sometimes salad Dinner: Grilled chicken, beef, occasional pasta.  Dessert: Fruit. Does not eat between meals. Beverages: Drinks 1/2 sweet/unsweet tea, and water.  She's not been exercising.  Wt Readings from Last 3 Encounters:  03/13/15 207 lb 6.4 oz (94.076 kg)  12/20/14 196 lb 1.9 oz (88.959 kg)  12/10/14 206 lb 12.8 oz (93.804 kg)     3) Breast tenderness: Bilateral. Present for the past month, tenderness to touch. Denies lumps, changes in skin texture. She has not had a mammogram this year.   Review of Systems  Constitutional: Positive for fatigue. Negative for fever.  Respiratory: Negative for shortness of breath.   Cardiovascular: Negative for chest pain.  Neurological: Negative for dizziness and headaches.       Past Medical History  Diagnosis Date  . 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  Hypertension   . Hypokalemia   . Obesity   . Migraine headache   . Depression   . High cholesterol   . Chicken pox     age 75  . Chronic back pain     Compressed discs. [place]  . GERD (gastroesophageal reflux disease)   . Borderline diabetes     Social History   Social History  . Marital Status: Married    Spouse Name: N/A  . Number of Children: N/A  . Years of Education: N/A   Occupational History  . Teacher    Social History Main Topics  . Smoking status: Never Smoker   . Smokeless tobacco: Never Used  . Alcohol Use: No  . Drug Use: No  . Sexual Activity:    Partners: Male    Birth Control/ Protection: Post-menopausal   Other Topics Concern  . Not on file   Social History Narrative   Married.   One son is 61, lives in [place].   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   Enjoys substitute teaching, gardening, cooking.    Past Surgical History  Procedure Laterality Date  . Cesarean section    . Foot surgery      bilateral bunions  . Tonsillectomy  1971  . Endometrial ablation  2007  . Bladder suspension  2005    Family History  Problem Relation Age of Onset  . Heart disease Mother   . Hypertension Mother   . Heart failure Mother  CHF  . Diabetes Mother   . Cancer Mother     CERVICAL CANCER  . Heart disease Father   . Diabetes Father   . Liver disease Father   . Hypertension Father   . Diabetes Sister   . Heart disease Brother   . Heart failure Brother     CHF  . Diabetes Brother   . Cancer Brother 58    THROAT AND NECK  . Cancer Maternal Grandfather     Allergies  Allergen Reactions  . Darvon   . Propoxyphene Hcl   . Victoza [Liraglutide] Other (See Comments)    Nausea and weakness  . Codeine Anxiety  . Hydrocodone Anxiety  . Meperidine Anxiety  . Oxycodone Anxiety  . Tramadol Anxiety    Current Outpatient Prescriptions on File Prior to Visit  Medication Sig Dispense Refill  . aspirin 81 MG chewable tablet  TAKE 1 TABLET (81 MG TOTAL) BY MOUTH DAILY. 30 tablet 1  . carvedilol (COREG) 12.5 MG tablet TAKE 1 TABLET BY MOUTH TWICE A DAY 60 tablet 3  . DULoxetine (CYMBALTA) 60 MG capsule Take 120 mg by mouth every morning.  0  . methylphenidate (CONCERTA) 18 MG CR tablet Take 18 mg by mouth daily.      . pantoprazole (PROTONIX) 20 MG tablet Take 1 tablet (20 mg total) by mouth daily. 30 tablet 11  . simvastatin (ZOCOR) 20 MG tablet TAKE 1 TABLET (20 MG TOTAL) BY MOUTH EVERY EVENING. 90 tablet 3  . spironolactone (ALDACTONE) 25 MG tablet TAKE 1 TABLET (25 MG TOTAL) BY MOUTH 2 (TWO) TIMES DAILY. 180 tablet 3  . topiramate (TOPAMAX) 100 MG tablet Take 100 mg by mouth 2 (two) times daily.   1  . Vitamin D, Ergocalciferol, (DRISDOL) 50000 UNITS CAPS capsule Take 1 capsule by mouth once a week for 12 weeks. 4 capsule 3   No current facility-administered medications on file prior to visit.    BP 126/86 mmHg  Pulse 93  Temp(Src) 98.5 F (36.9 C) (Oral)  Ht 5\' 3"  (1.6 m)  Wt 207 lb 6.4 oz (94.076 kg)  BMI 36.75 kg/m2  SpO2 98%    Objective:   Physical Exam  Constitutional: She is oriented to person, place, and time. She appears well-nourished.  Cardiovascular: Normal rate and regular rhythm.   Pulmonary/Chest: Effort normal and breath sounds normal.  Neurological: She is alert and oriented to person, place, and time.  Skin: Skin is warm and dry.  Psychiatric: She has a normal mood and affect.          Assessment & Plan:  Breast tenderness:  Present x 1 month. No lumps, changes in skin texture. Ordered diagnostic mammogram bilaterally. Mammogram pending.

## 2015-03-13 NOTE — Telephone Encounter (Signed)
i called [facility] to make ms Walrath her bilateral diagnostic mammogram. Debbie @ [facility] said since ms Lorenson could not pin point a center area they could change to screening and would make a note of breast pain. Debbie change from diagnostic to screening

## 2015-03-14 ENCOUNTER — Telehealth: Payer: Self-pay | Admitting: Primary Care

## 2015-03-14 NOTE — Telephone Encounter (Signed)
Pt returned your call.  

## 2015-03-14 NOTE — Telephone Encounter (Signed)
Called and notified patient of Kate's comments. Patient verbalized understanding.  

## 2015-03-17 LAB — HM MAMMOGRAPHY

## 2015-03-19 ENCOUNTER — Encounter: Payer: Self-pay | Admitting: Primary Care

## 2015-03-20 ENCOUNTER — Telehealth: Payer: Self-pay | Admitting: *Deleted

## 2015-03-20 ENCOUNTER — Encounter: Payer: Self-pay | Admitting: Primary Care

## 2015-03-20 MED ORDER — CARVEDILOL 12.5 MG PO TABS: 12.5000 mg | ORAL_TABLET | Freq: Two times a day (BID) | ORAL | Status: DC

## 2015-03-20 NOTE — Telephone Encounter (Signed)
°  1. Which medications need to be refilled? Carvedilol   2. Which [facility] [address]   3. Do they need a 30 day or 90 day supply? 90   4. Would they like a call back once the medication has been sent to the pharmacy? No

## 2015-03-20 NOTE — Telephone Encounter (Signed)
Refill sent for carvedilol.  

## 2015-03-27 ENCOUNTER — Other Ambulatory Visit: Payer: Self-pay | Admitting: Primary Care

## 2015-03-27 ENCOUNTER — Encounter: Payer: Self-pay | Admitting: Primary Care

## 2015-03-27 DIAGNOSIS — G2581 Restless legs syndrome: Secondary | ICD-10-CM

## 2015-03-27 MED ORDER — ROPINIROLE HCL 0.5 MG PO TABS: 0.5000 mg | ORAL_TABLET | Freq: Every day | ORAL | Status: DC

## 2015-04-05 ENCOUNTER — Other Ambulatory Visit: Payer: Self-pay | Admitting: Primary Care

## 2015-04-07 NOTE — Telephone Encounter (Signed)
Electronically refill request for  Vitamin D, Ergocalciferol, (DRISDOL) 50000 UNITS CAPS capsule  Take 1 capsule by mouth once a week for 12 weeks. Dispense: 4 capsule   Refills: 3   However, last lab for Vitamin D on 03/13/15. Patient was notified to start taking OTC Vitamin D. Last prescribed on 12/10/14. Last seen for acute on 12/20/14. Next follow up on 06/16/15.

## 2015-04-10 ENCOUNTER — Other Ambulatory Visit: Payer: Self-pay | Admitting: Primary Care

## 2015-04-10 ENCOUNTER — Encounter: Payer: Self-pay | Admitting: Primary Care

## 2015-04-10 DIAGNOSIS — K219 Gastro-esophageal reflux disease without esophagitis: Secondary | ICD-10-CM

## 2015-04-10 MED ORDER — PANTOPRAZOLE SODIUM 20 MG PO TBEC: 20.0000 mg | DELAYED_RELEASE_TABLET | Freq: Every day | ORAL | Status: DC

## 2015-04-11 ENCOUNTER — Other Ambulatory Visit: Payer: Self-pay | Admitting: Primary Care

## 2015-04-11 DIAGNOSIS — K219 Gastro-esophageal reflux disease without esophagitis: Secondary | ICD-10-CM

## 2015-04-11 MED ORDER — PANTOPRAZOLE SODIUM 40 MG PO TBEC: 40.0000 mg | DELAYED_RELEASE_TABLET | Freq: Every day | ORAL | Status: DC

## 2015-04-14 ENCOUNTER — Encounter: Payer: Self-pay | Admitting: Primary Care

## 2015-04-14 ENCOUNTER — Telehealth: Payer: Self-pay | Admitting: Primary Care

## 2015-04-23 NOTE — Telephone Encounter (Signed)
Received faxed request for (PA) prior authorization for pantoprazole 40 mg.  Called insurance to verify what form to use for the PA and was told to use express scripts.  Sent PA on 04/14/15.  PA was approved on 04/16/15. Case ID [id number]  Aproved effective 03/15/15 thru 04/13/2016.

## 2015-05-05 ENCOUNTER — Ambulatory Visit: Payer: BC Managed Care – PPO | Admitting: Cardiovascular Disease

## 2015-05-09 ENCOUNTER — Ambulatory Visit (INDEPENDENT_AMBULATORY_CARE_PROVIDER_SITE_OTHER): Payer: BC Managed Care – PPO

## 2015-05-09 DIAGNOSIS — Z23 Encounter for immunization: Secondary | ICD-10-CM | POA: Diagnosis not present

## 2015-05-22 ENCOUNTER — Other Ambulatory Visit: Payer: Self-pay | Admitting: Primary Care

## 2015-06-09 ENCOUNTER — Encounter: Payer: Self-pay | Admitting: Primary Care

## 2015-06-09 ENCOUNTER — Telehealth: Payer: Self-pay | Admitting: Primary Care

## 2015-06-09 NOTE — Telephone Encounter (Signed)
Baker Janus with [facility] said pt wants to restart Cymbalta and pt request cb.Please advise. Pt also sent email to Gentry Fitz NP.

## 2015-06-09 NOTE — Telephone Encounter (Signed)
[facility]  Patient Name: JOSPHINE KNAUB  DOB: Nov 17, 1950    Initial Comment Caller states his wife had a change in medication, from [place]. Horrible headache.   Nurse Assessment  Nurse: Wynetta Emery, RN, Baker Janus Date/Time Eilene Ghazi Time): 06/09/2015 9:27:56 AM  Confirm and document reason for call. If symptomatic, describe symptoms. ---Rise Paganini has been transistioning her to another medication ---- cymbalta 60mg  daily x7 days then cymbalta 60mg  qod x 7days -- Abilify 1/2 tablet x [redacted] week along with [place] 1/2 tablet x 1 week stopped on Saturday d/t developing a headache that will not quit  Has the patient traveled out of the country within the last 30 days? ---No  Does the patient have any new or worsening symptoms? ---Yes  Will a triage be completed? ---Yes  Related visit to physician within the last 2 weeks? ---Yes  Does the PT have any chronic conditions? (i.e. diabetes, asthma, etc.) ---Yes  List chronic conditions. ---Depression/anxiety  Is this a behavioral health call? ---No     Guidelines    Guideline Title Affirmed Question Affirmed Notes       Final Disposition User        Comments  NOTE; NO APPTS TODAY --- would like to get cancellation list or have K. Clark NP call them back for advise on what medications she needs to be taking or starting back on cymbalta -- she has major headache that will not go away -- Was stair stepped OFF of cymbalta and on to [place] - was to increase dose of Latuda today -- STOPPED all medication Saturday when developed a headache that would not stop -- wants to go back on cymbalta how do they restart it or what advise can be given . Thanks

## 2015-06-09 NOTE — Telephone Encounter (Signed)
She is currently managed by psychiatry and needs to call their office for their recommendation regarding cymbalta since they are handling this. If her headache doesn't improve we can see her in the office for treatment.

## 2015-06-09 NOTE — Telephone Encounter (Signed)
Message left for patient to return my call.  

## 2015-06-10 NOTE — Telephone Encounter (Signed)
Message left for patient to return my call.  

## 2015-06-10 NOTE — Telephone Encounter (Signed)
Patient read Kate's comments in [place].

## 2015-06-16 ENCOUNTER — Ambulatory Visit: Payer: BC Managed Care – PPO | Admitting: Primary Care

## 2015-06-17 ENCOUNTER — Ambulatory Visit: Payer: BC Managed Care – PPO | Admitting: Primary Care

## 2015-06-30 ENCOUNTER — Other Ambulatory Visit: Payer: Self-pay | Admitting: Primary Care

## 2015-06-30 DIAGNOSIS — G2581 Restless legs syndrome: Secondary | ICD-10-CM

## 2015-06-30 NOTE — Telephone Encounter (Signed)
Electronically refill request for   ropinirole (REQUIP) 0.5 MG tablet   Take 1 tablet (0.5 mg total) by mouth at bedtime.  Dispense: 30 tablet   Refills: 3     Last prescribed on  03/27/2015. Last see on 03/13/2015. No future appointment.

## 2015-07-27 ENCOUNTER — Other Ambulatory Visit (HOSPITAL_COMMUNITY): Payer: Self-pay | Admitting: Psychiatry

## 2015-08-12 ENCOUNTER — Other Ambulatory Visit: Payer: Self-pay | Admitting: Neurosurgery

## 2015-08-12 DIAGNOSIS — M5416 Radiculopathy, lumbar region: Secondary | ICD-10-CM

## 2015-08-19 NOTE — Discharge Instructions (Signed)
Myelogram Discharge Instructions  1. Go home and rest quietly for the next 24 hours.  It is important to lie flat for the next 24 hours.  Get up only to go to the restroom.  You may lie in the bed or on a couch on your back, your stomach, your left side or your right side.  You may have one pillow under your head.  You may have pillows between your knees while you are on your side or under your knees while you are on your back.  2. DO NOT drive today.  Recline the seat as far back as it will go, while still wearing your seat belt, on the way home.  3. You may get up to go to the bathroom as needed.  You may sit up for 10 minutes to eat.  You may resume your normal diet and medications unless otherwise indicated.  Drink lots of extra fluids today and tomorrow.  4. The incidence of headache, nausea, or vomiting is about 5% (one in 20 patients).  If you develop a headache, lie flat and drink plenty of fluids until the headache goes away.  Caffeinated beverages may be helpful.  If you develop severe nausea and vomiting or a headache that does not go away with flat bed rest, call [phone].  5. You may resume normal activities after your 24 hours of bed rest is over; however, do not exert yourself strongly or do any heavy lifting tomorrow. If when you get up you have a headache when standing, go back to bed and force fluids for another 24 hours.  6. Call your physician for a follow-up appointment.  The results of your myelogram will be sent directly to your physician by the following day.  7. If you have any questions or if complications develop after you arrive home, please call [phone].  Discharge instructions have been explained to the patient.  The patient, or the person responsible for the patient, fully understands these instructions.      May resume Cymbalta on Feb. 2, 2017, after 9:30 am.

## 2015-08-20 ENCOUNTER — Ambulatory Visit
Admission: RE | Admit: 2015-08-20 | Discharge: 2015-08-20 | Disposition: A | Discharge status: Home / Self Care | Payer: BC Managed Care – PPO | Source: Ambulatory Visit | Attending: Neurosurgery | Admitting: Neurosurgery

## 2015-08-20 DIAGNOSIS — M5416 Radiculopathy, lumbar region: Secondary | ICD-10-CM

## 2015-08-20 MED ORDER — DIAZEPAM 5 MG PO TABS
10.0000 mg | ORAL_TABLET | Freq: Once | ORAL | Status: AC
  Administered 2015-08-20: 5 mg via ORAL

## 2015-08-20 MED ORDER — IOHEXOL 180 MG/ML  SOLN
15.0000 mL | Freq: Once | INTRAMUSCULAR | Status: AC | PRN
Start: 2015-08-20 — End: 2015-08-20
  Administered 2015-08-20: 15 mL via INTRATHECAL

## 2015-08-20 NOTE — Progress Notes (Signed)
Patient states she has been off Cymbalta for the past two days.

## 2015-08-27 ENCOUNTER — Other Ambulatory Visit: Payer: Self-pay

## 2015-08-27 MED ORDER — CARVEDILOL 12.5 MG PO TABS: 12.5000 mg | ORAL_TABLET | Freq: Two times a day (BID) | ORAL | Status: DC

## 2015-08-27 NOTE — Telephone Encounter (Signed)
Pt is completely out

## 2015-10-03 ENCOUNTER — Other Ambulatory Visit: Payer: Self-pay | Admitting: Neurosurgery

## 2015-10-09 ENCOUNTER — Ambulatory Visit: Payer: BC Managed Care – PPO | Admitting: Cardiovascular Disease

## 2015-10-09 ENCOUNTER — Encounter: Payer: Self-pay | Admitting: *Deleted

## 2015-11-03 ENCOUNTER — Inpatient Hospital Stay (HOSPITAL_COMMUNITY): Admission: RE | Admit: 2015-11-03 | Payer: BC Managed Care – PPO | Source: Ambulatory Visit

## 2015-11-10 ENCOUNTER — Encounter (HOSPITAL_COMMUNITY): Admission: RE | Payer: Self-pay | Source: Ambulatory Visit

## 2015-11-10 ENCOUNTER — Inpatient Hospital Stay (HOSPITAL_COMMUNITY): Admission: RE | Admit: 2015-11-10 | Payer: BC Managed Care – PPO | Source: Ambulatory Visit | Admitting: Neurosurgery

## 2015-11-10 SURGERY — POSTERIOR LUMBAR FUSION 1 LEVEL
Anesthesia: General

## 2015-11-25 ENCOUNTER — Other Ambulatory Visit: Payer: Self-pay | Admitting: Neurosurgery

## 2015-12-03 ENCOUNTER — Other Ambulatory Visit: Payer: Self-pay | Admitting: Cardiovascular Disease

## 2015-12-08 ENCOUNTER — Encounter: Payer: Self-pay | Admitting: *Deleted

## 2015-12-08 ENCOUNTER — Ambulatory Visit: Payer: BC Managed Care – PPO | Admitting: Cardiovascular Disease

## 2015-12-11 ENCOUNTER — Other Ambulatory Visit: Payer: Self-pay | Admitting: Cardiovascular Disease

## 2016-01-05 ENCOUNTER — Inpatient Hospital Stay (HOSPITAL_COMMUNITY)
Admission: RE | Admit: 2016-01-05 | Discharge: 2016-01-05 | Disposition: A | Discharge status: Home / Self Care | Payer: BC Managed Care – PPO | Source: Ambulatory Visit

## 2016-01-05 NOTE — Pre-Procedure Instructions (Signed)
    Katherine [name]  01/05/2016      [name] [address] [place] [address] Phone: [phone] Fax: [phone]    Your procedure is scheduled on 01/12/16.  Report to [facility] Admitting at 830 A.M.  Call this number if you have problems the morning of surgery:  [phone]   Remember:  Do not eat food or drink liquids after midnight.  Take these medicines the morning of surgery with A SIP OF WATER --carvedilol,zyrtec,cymbalta,protonix,topamax Do not take any aspirin,anti-inflammatories,vitamins,or herbal supplements 5-7 days prior to surgery.  Do not wear jewelry, make-up or nail polish.  Do not wear lotions, powders, or perfumes.  You may wear deoderant.  Do not shave 48 hours prior to surgery.  Men may shave face and neck.  Do not bring valuables to the hospital.  [facility] is not responsible for any belongings or valuables.  Contacts, dentures or bridgework may not be worn into surgery.  Leave your suitcase in the car.  After surgery it may be brought to your room.  For patients admitted to the hospital, discharge time will be determined by your treatment team.  Patients discharged the day of surgery will not be allowed to drive home.   Name and phone number of your driver:   Special instructions:    Please read over the following fact sheets that you were given.

## 2016-01-08 ENCOUNTER — Other Ambulatory Visit: Payer: Self-pay | Admitting: Primary Care

## 2016-01-08 ENCOUNTER — Encounter: Payer: Self-pay | Admitting: Cardiovascular Disease

## 2016-01-08 ENCOUNTER — Ambulatory Visit (INDEPENDENT_AMBULATORY_CARE_PROVIDER_SITE_OTHER): Payer: BC Managed Care – PPO | Admitting: Cardiovascular Disease

## 2016-01-08 VITALS — BP 120/82 | HR 91 | Ht 63.0 in | Wt 192.2 lb

## 2016-01-08 DIAGNOSIS — E876 Hypokalemia: Secondary | ICD-10-CM

## 2016-01-08 DIAGNOSIS — I10 Essential (primary) hypertension: Secondary | ICD-10-CM | POA: Diagnosis not present

## 2016-01-08 DIAGNOSIS — M549 Dorsalgia, unspecified: Secondary | ICD-10-CM | POA: Insufficient documentation

## 2016-01-08 DIAGNOSIS — E785 Hyperlipidemia, unspecified: Secondary | ICD-10-CM | POA: Diagnosis not present

## 2016-01-08 DIAGNOSIS — E669 Obesity, unspecified: Secondary | ICD-10-CM

## 2016-01-08 MED ORDER — CARVEDILOL 12.5 MG PO TABS: 12.5000 mg | ORAL_TABLET | Freq: Two times a day (BID) | ORAL | Status: DC

## 2016-01-08 MED ORDER — SPIRONOLACTONE 25 MG PO TABS: 25.0000 mg | ORAL_TABLET | Freq: Two times a day (BID) | ORAL | Status: DC

## 2016-01-08 MED ORDER — SIMVASTATIN 20 MG PO TABS: 20.0000 mg | ORAL_TABLET | Freq: Every day | ORAL | Status: DC

## 2016-01-08 NOTE — Progress Notes (Signed)
Patient ID: KATHYRN LONSINGER, female   DOB: 06/15/1951, 65 y.o.   MRN: [id number] Cardiology Office Note  Date:  01/08/2016   ID:  Daphnee, Thorp 1951-02-25, MRN [id number]  PCP:  Sheral Flow, NP   Chief Complaint  Patient presents with  . other    No complaints today. Meds reviewed verbally with pt.    HPI:  Alonzo is a 65 y/o woman with a h/o CP with no CAD on cath 2007, HTN and obesity. She returns today for routine follow-up Of her blood pressure, history of hypokalemia Previous echo showed EF 65% no signifcant valvular disease.  Possible OSA by hx. Ms. Ruhl continues to battle with her weight.   In follow-up today, she reports that she is doing well, unable to exercise secondary to chronic back pain Scheduled for back surgery with Dr. 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  Previously started on spironolactone for low potassium Lab work from 2016 reviewed with her, total cholesterol is excellent, 130s  EKG shows normal sinus rhythm with rate 91 beats per minute with no significant ST or T wave changes  PMH:   has a past medical history of Chest pain; Hypertension; Hypokalemia; Obesity; Migraine headache; Depression; High cholesterol; Chicken pox; Chronic back pain; GERD (gastroesophageal reflux disease); and Borderline diabetes.  PSH:    Past Surgical History  Procedure Laterality Date  . Cesarean section    . Foot surgery      bilateral bunions  . Tonsillectomy  1971  . Endometrial ablation  2007  . Bladder suspension  2005    Current Outpatient Prescriptions  Medication Sig Dispense Refill  . carvedilol (COREG) 12.5 MG tablet Take 1 tablet (12.5 mg total) by mouth 2 (two) times daily. 180 tablet 3  . cetirizine (ZYRTEC) 10 MG tablet Take 10 mg by mouth daily as needed for allergies.    . DULoxetine (CYMBALTA) 30 MG capsule Take 30 mg by mouth daily.  0  .  DULoxetine (CYMBALTA) 60 MG capsule Take 60 mg by mouth daily.   0  . naproxen sodium (ANAPROX) 220 MG tablet Take 440 mg by mouth 2 (two) times daily with a meal.    . pantoprazole (PROTONIX) 40 MG tablet Take 1 tablet (40 mg total) by mouth daily. 30 tablet 11  . rOPINIRole (REQUIP) 0.5 MG tablet TAKE 1 TABLET (0.5 MG TOTAL) BY MOUTH AT BEDTIME. 30 tablet 5  . spironolactone (ALDACTONE) 25 MG tablet Take 1 tablet (25 mg total) by mouth 2 (two) times daily. 180 tablet 3  . topiramate (TOPAMAX) 100 MG tablet Take 100 mg by mouth 2 (two) times daily.   1  . simvastatin (ZOCOR) 20 MG tablet Take 1 tablet (20 mg total) by mouth daily at 6 PM. 90 tablet 3   No current facility-administered medications for this visit.     Allergies:   Codeine; Darvon; Demerol; Hydrocodone; Oxycodone; Tramadol; and Victoza   Social History:  The patient  reports that she has never smoked. She has never used smokeless tobacco. She reports that she does not drink alcohol or use illicit drugs.   Family History:   family history includes Cancer in her maternal grandfather and mother; Cancer (age of onset: 32) in her brother; Diabetes in her brother, father, mother, and sister; Heart disease in her brother, father, and mother; Heart failure in her brother and mother; Hypertension  in her father and mother; Liver disease in her father.    Review of Systems: Review of Systems  Constitutional: Negative.   Respiratory: Negative.   Cardiovascular: Negative.   Gastrointestinal: Negative.   Musculoskeletal: Positive for back pain.  Neurological: Negative.   Psychiatric/Behavioral: Negative.   All other systems reviewed and are negative.    PHYSICAL EXAM: VS:  BP 120/82 mmHg  Pulse 91  Ht 5\' 3"  (1.6 m)  Wt 192 lb 4 oz (87.204 kg)  BMI 34.06 kg/m2 , BMI Body mass index is 34.06 kg/(m^2). GEN: Well nourished, well developed, in no acute distress HEENT: normal Neck: no JVD, carotid bruits, or masses Cardiac: RRR;  no murmurs, rubs, or gallops,no edema  Respiratory:  clear to auscultation bilaterally, normal work of breathing GI: soft, nontender, nondistended, + BS MS: no deformity or atrophy Skin: warm and dry, no rash Neuro:  Strength and sensation are intact Psych: euthymic mood, full affect    Recent Labs: 03/13/2015: Hemoglobin 12.9; Platelets 354.0    Lipid Panel Lab Results  Component Value Date   CHOL 130 12/09/2014   HDL 53.60 12/09/2014   LDLCALC 40 12/09/2014   TRIG 180.0* 12/09/2014      Wt Readings from Last 3 Encounters:  01/08/16 192 lb 4 oz (87.204 kg)  03/13/15 207 lb 6.4 oz (94.076 kg)  12/20/14 196 lb 1.9 oz (88.959 kg)       ASSESSMENT AND PLAN:  HYPERTENSION, BENIGN - Plan: EKG 12-Lead Blood pressure is well controlled on today's visit. No changes made to the medications.  Hyperlipidemia Cholesterol is at goal on the current lipid regimen. No changes to the medications were made. Simvastatin renewed  HYPOKALEMIA Encouraged her to stay on her Aldactone   Obesity After back surgery, recommended a regular exercise regimen, dietary changes for weight loss  Back pain, unspecified location Acceptable risk for upcoming back surgery, no significant active cardiac issues at this time No further testing needed   Total encounter time more than 15 minutes  Greater than 50% was spent in counseling and coordination of care with the patient    Disposition:   F/U  12 months   Orders Placed This Encounter  Procedures  . EKG 12-Lead     Signed, Esmond Plants, M.D., Ph.D. 01/08/2016  [place], [place]

## 2016-01-08 NOTE — Patient Instructions (Signed)
Medication Instructions:   No changes   Follow-Up: It was a pleasure seeing you in the office today. Please call us if you have new issues that need to be addressed before your next appt.  [phone]  Your physician wants you to follow-up in: 12 months.  You will receive a reminder letter in the mail two months in advance. If you don't receive a letter, please call our office to schedule the follow-up appointment.  If you need a refill on your cardiac medications before your next appointment, please call your pharmacy.

## 2016-01-09 ENCOUNTER — Other Ambulatory Visit: Payer: Self-pay | Admitting: *Deleted

## 2016-01-09 ENCOUNTER — Encounter (HOSPITAL_COMMUNITY): Payer: Self-pay

## 2016-01-09 ENCOUNTER — Encounter (HOSPITAL_COMMUNITY)
Admission: RE | Admit: 2016-01-09 | Discharge: 2016-01-09 | Disposition: A | Discharge status: Home / Self Care | Payer: BC Managed Care – PPO | Source: Ambulatory Visit | Attending: Neurosurgery | Admitting: Neurosurgery

## 2016-01-09 DIAGNOSIS — E7800 Pure hypercholesterolemia, unspecified: Secondary | ICD-10-CM | POA: Diagnosis not present

## 2016-01-09 DIAGNOSIS — G4730 Sleep apnea, unspecified: Secondary | ICD-10-CM | POA: Diagnosis not present

## 2016-01-09 DIAGNOSIS — K219 Gastro-esophageal reflux disease without esophagitis: Secondary | ICD-10-CM | POA: Diagnosis not present

## 2016-01-09 DIAGNOSIS — G8929 Other chronic pain: Secondary | ICD-10-CM | POA: Diagnosis not present

## 2016-01-09 DIAGNOSIS — M5416 Radiculopathy, lumbar region: Secondary | ICD-10-CM | POA: Diagnosis not present

## 2016-01-09 DIAGNOSIS — M1990 Unspecified osteoarthritis, unspecified site: Secondary | ICD-10-CM | POA: Diagnosis not present

## 2016-01-09 DIAGNOSIS — E119 Type 2 diabetes mellitus without complications: Secondary | ICD-10-CM | POA: Diagnosis not present

## 2016-01-09 DIAGNOSIS — I10 Essential (primary) hypertension: Secondary | ICD-10-CM | POA: Diagnosis not present

## 2016-01-09 DIAGNOSIS — M4806 Spinal stenosis, lumbar region: Secondary | ICD-10-CM | POA: Diagnosis present

## 2016-01-09 HISTORY — DX: Unspecified osteoarthritis, unspecified site: M19.90

## 2016-01-09 HISTORY — DX: Anxiety disorder, unspecified: F41.9

## 2016-01-09 LAB — BASIC METABOLIC PANEL
Anion gap: 8 (ref 5–15)
BUN: 13 mg/dL (ref 6–20)
CHLORIDE: 112 mmol/L — AB (ref 101–111)
CO2: 20 mmol/L — ABNORMAL LOW (ref 22–32)
CREATININE: 0.78 mg/dL (ref 0.44–1.00)
Calcium: 9.4 mg/dL (ref 8.9–10.3)
GFR calc Af Amer: >60 mL/min (ref >60)
GLUCOSE: 115 mg/dL — AB (ref 65–99)
Potassium: 4.3 mmol/L (ref 3.5–5.1)
SODIUM: 140 mmol/L (ref 135–145)

## 2016-01-09 LAB — GLUCOSE, CAPILLARY: Glucose-Capillary: 110 mg/dL — ABNORMAL HIGH (ref 65–99)

## 2016-01-09 LAB — CBC
HCT: 38.8 % (ref 36.0–46.0)
Hemoglobin: 11.6 g/dL — ABNORMAL LOW (ref 12.0–15.0)
MCH: 24.7 pg — AB (ref 26.0–34.0)
MCHC: 29.9 g/dL — AB (ref 30.0–36.0)
MCV: 82.6 fL (ref 78.0–100.0)
PLATELETS: 339 10*3/uL (ref 150–400)
RBC: 4.7 MIL/uL (ref 3.87–5.11)
RDW: 16 % — ABNORMAL HIGH (ref 11.5–15.5)
WBC: 9 10*3/uL (ref 4.0–10.5)

## 2016-01-09 LAB — SURGICAL PCR SCREEN
MRSA, PCR: NEGATIVE
Staphylococcus aureus: POSITIVE — AB

## 2016-01-09 MED ORDER — SPIRONOLACTONE 25 MG PO TABS: 25.0000 mg | ORAL_TABLET | Freq: Two times a day (BID) | ORAL | Status: DC

## 2016-01-09 NOTE — Pre-Procedure Instructions (Signed)
Katherine [name]  01/09/2016      [id number] [name] [place] [address] Phone: [phone] Fax: [phone]    Your procedure is scheduled on   Monday  01/12/16  Report to [facility] Admitting at 730 A.M.  Call this number if you have problems the morning of surgery:  [phone]   Remember:  Do not eat food or drink liquids after midnight.  Take these medicines the morning of surgery with A SIP OF WATER   CARVEDILOL (COREG), DULOXETINE (CYMBALTA), PANTOPRAZOLE (PROTONIX), TOPIRAMATE (TOPAMAX)                                                                                                         (STOP ASPIRIN, IBUPROFEN/ ADVIL/ MOTRIN, ALEVE, GOODY POWDERS/ BC'S, NAPROXEN/ ANAPROX, HERBAL MEDICINES)  Do not wear jewelry, make-up or nail polish.  Do not wear lotions, powders, or perfumes.  You may wear deoderant.  Do not shave 48 hours prior to surgery.  Men may shave face and neck.  Do not bring valuables to the hospital.  [facility] is not responsible for any belongings or valuables.  Contacts, dentures or bridgework may not be worn into surgery.  Leave your suitcase in the car.  After surgery it may be brought to your room.  For patients admitted to the hospital, discharge time will be determined by your treatment team.  Patients discharged the day of surgery will not be allowed to drive home.   Name and phone number of your driver:   Special instructions:  [facility] - Preparing for Surgery  Before surgery, you can play an important role.  Because skin is not sterile, your skin needs to be as free of germs as possible.  You can reduce the number of germs on you skin by washing with CHG (chlorahexidine gluconate) soap before surgery.  CHG is an antiseptic cleaner which kills germs and bonds with the skin to continue killing germs even after washing.  Please DO NOT use if you have an allergy to CHG or  antibacterial soaps.  If your skin becomes reddened/irritated stop using the CHG and inform your nurse when you arrive at Short Stay.  Do not shave (including legs and underarms) for at least 48 hours prior to the first CHG shower.  You may shave your face.  Please follow these instructions carefully:   1.  Shower with CHG Soap the night before surgery and the                                morning of Surgery.  2.  If you choose to wash your hair, wash your hair first as usual with your       normal shampoo.  3.  After you shampoo, rinse your hair and body thoroughly to remove the  Shampoo.  4.  Use CHG as you would any other liquid soap.  You can apply chg directly       to the skin and wash gently with scrungie or a clean washcloth.  5.  Apply the CHG Soap to your body ONLY FROM THE NECK DOWN.        Do not use on open wounds or open sores.  Avoid contact with your eyes,       ears, mouth and genitals (private parts).  Wash genitals (private parts)       with your normal soap.  6.  Wash thoroughly, paying special attention to the area where your surgery        will be performed.  7.  Thoroughly rinse your body with warm water from the neck down.  8.  DO NOT shower/wash with your normal soap after using and rinsing off       the CHG Soap.  9.  Pat yourself dry with a clean towel.            10.  Wear clean pajamas.            11.  Place clean sheets on your bed the night of your first shower and do not        sleep with pets.  Day of Surgery  Do not apply any lotions/deoderants the morning of surgery.  Please wear clean clothes to the hospital/surgery center.    Please read over the following fact sheets that you were given. Pain Booklet, MRSA Information and Surgical Site Infection Prevention

## 2016-01-10 LAB — HEMOGLOBIN A1C
HEMOGLOBIN A1C: 5.8 % — AB (ref 4.8–5.6)
MEAN PLASMA GLUCOSE: 120 mg/dL

## 2016-01-12 ENCOUNTER — Ambulatory Visit (HOSPITAL_COMMUNITY): Payer: BC Managed Care – PPO

## 2016-01-12 ENCOUNTER — Encounter (HOSPITAL_COMMUNITY)
Admission: RE | Disposition: A | Discharge status: Home / Self Care | Payer: Self-pay | Source: Ambulatory Visit | Attending: Neurosurgery

## 2016-01-12 ENCOUNTER — Ambulatory Visit (HOSPITAL_COMMUNITY): Payer: BC Managed Care – PPO | Admitting: Certified Registered Nurse Anesthetist

## 2016-01-12 ENCOUNTER — Ambulatory Visit (HOSPITAL_COMMUNITY)
Admission: RE | Admit: 2016-01-12 | Discharge: 2016-01-13 | Disposition: A | Discharge status: Home / Self Care | Payer: BC Managed Care – PPO | Source: Ambulatory Visit | Attending: Neurosurgery | Admitting: Neurosurgery

## 2016-01-12 ENCOUNTER — Encounter (HOSPITAL_COMMUNITY): Payer: Self-pay | Admitting: Urology

## 2016-01-12 DIAGNOSIS — I10 Essential (primary) hypertension: Secondary | ICD-10-CM | POA: Insufficient documentation

## 2016-01-12 DIAGNOSIS — M5416 Radiculopathy, lumbar region: Secondary | ICD-10-CM | POA: Insufficient documentation

## 2016-01-12 DIAGNOSIS — G4730 Sleep apnea, unspecified: Secondary | ICD-10-CM | POA: Insufficient documentation

## 2016-01-12 DIAGNOSIS — E119 Type 2 diabetes mellitus without complications: Secondary | ICD-10-CM | POA: Insufficient documentation

## 2016-01-12 DIAGNOSIS — M1990 Unspecified osteoarthritis, unspecified site: Secondary | ICD-10-CM | POA: Insufficient documentation

## 2016-01-12 DIAGNOSIS — M48062 Spinal stenosis, lumbar region with neurogenic claudication: Secondary | ICD-10-CM | POA: Diagnosis present

## 2016-01-12 DIAGNOSIS — Z419 Encounter for procedure for purposes other than remedying health state, unspecified: Secondary | ICD-10-CM

## 2016-01-12 DIAGNOSIS — K219 Gastro-esophageal reflux disease without esophagitis: Secondary | ICD-10-CM | POA: Insufficient documentation

## 2016-01-12 DIAGNOSIS — G8929 Other chronic pain: Secondary | ICD-10-CM | POA: Insufficient documentation

## 2016-01-12 DIAGNOSIS — M4806 Spinal stenosis, lumbar region: Secondary | ICD-10-CM | POA: Insufficient documentation

## 2016-01-12 DIAGNOSIS — E7800 Pure hypercholesterolemia, unspecified: Secondary | ICD-10-CM | POA: Insufficient documentation

## 2016-01-12 HISTORY — PX: LUMBAR LAMINECTOMY/DECOMPRESSION MICRODISCECTOMY: SHX5026

## 2016-01-12 LAB — GLUCOSE, CAPILLARY
Glucose-Capillary: 109 mg/dL — ABNORMAL HIGH (ref 65–99)
Glucose-Capillary: 113 mg/dL — ABNORMAL HIGH (ref 65–99)

## 2016-01-12 SURGERY — LUMBAR LAMINECTOMY/DECOMPRESSION MICRODISCECTOMY 1 LEVEL
Anesthesia: General | Laterality: Bilateral

## 2016-01-12 MED ORDER — 0.9 % SODIUM CHLORIDE (POUR BTL) OPTIME
TOPICAL | Status: DC | PRN
  Administered 2016-01-12: 1000 mL

## 2016-01-12 MED ORDER — DIAZEPAM 5 MG PO TABS: 5.0000 mg | ORAL_TABLET | Freq: Four times a day (QID) | ORAL | Status: DC | PRN

## 2016-01-12 MED ORDER — PHENOL 1.4 % MT LIQD
1.0000 | OROMUCOSAL | Status: DC | PRN
  Administered 2016-01-12: 1 via OROMUCOSAL
  Filled 2016-01-12: qty 177

## 2016-01-12 MED ORDER — PROPOFOL 10 MG/ML IV BOLUS
INTRAVENOUS | Status: AC
  Filled 2016-01-12: qty 20

## 2016-01-12 MED ORDER — LORAZEPAM 0.5 MG PO TABS
0.5000 mg | ORAL_TABLET | Freq: Every day | ORAL | Status: DC
  Administered 2016-01-12: 0.5 mg via ORAL
  Filled 2016-01-12: qty 1

## 2016-01-12 MED ORDER — ARTIFICIAL TEARS OP OINT
TOPICAL_OINTMENT | OPHTHALMIC | Status: DC | PRN
  Administered 2016-01-12: 1 via OPHTHALMIC

## 2016-01-12 MED ORDER — HYDROMORPHONE HCL 2 MG PO TABS
4.0000 mg | ORAL_TABLET | ORAL | Status: DC | PRN
  Administered 2016-01-12 – 2016-01-13 (×4): 4 mg via ORAL
  Filled 2016-01-12 (×4): qty 2

## 2016-01-12 MED ORDER — NAPROXEN SODIUM 275 MG PO TABS
440.0000 mg | ORAL_TABLET | Freq: Two times a day (BID) | ORAL | Status: DC
  Administered 2016-01-12 – 2016-01-13 (×2): 412.5 mg via ORAL
  Filled 2016-01-12 (×2): qty 2

## 2016-01-12 MED ORDER — CEFAZOLIN SODIUM-DEXTROSE 2-4 GM/100ML-% IV SOLN
2.0000 g | INTRAVENOUS | Status: DC
  Filled 2016-01-12: qty 100

## 2016-01-12 MED ORDER — HYDROMORPHONE HCL 1 MG/ML IJ SOLN: 1.0000 mg | INTRAMUSCULAR | Status: DC | PRN

## 2016-01-12 MED ORDER — MIDAZOLAM HCL 2 MG/2ML IJ SOLN
INTRAMUSCULAR | Status: AC
  Filled 2016-01-12: qty 2

## 2016-01-12 MED ORDER — PHENYLEPHRINE HCL 10 MG/ML IJ SOLN
INTRAMUSCULAR | Status: DC | PRN
  Administered 2016-01-12 (×3): 100 ug via INTRAVENOUS
  Administered 2016-01-12 (×2): 80 ug via INTRAVENOUS

## 2016-01-12 MED ORDER — LACTATED RINGERS IV SOLN: INTRAVENOUS | Status: DC

## 2016-01-12 MED ORDER — ACETAMINOPHEN 650 MG RE SUPP: 650.0000 mg | RECTAL | Status: DC | PRN

## 2016-01-12 MED ORDER — FENTANYL CITRATE (PF) 100 MCG/2ML IJ SOLN
INTRAMUSCULAR | Status: DC | PRN
  Administered 2016-01-12: 50 ug via INTRAVENOUS
  Administered 2016-01-12: 100 ug via INTRAVENOUS

## 2016-01-12 MED ORDER — BUPIVACAINE-EPINEPHRINE (PF) 0.5% -1:200000 IJ SOLN
INTRAMUSCULAR | Status: DC | PRN
  Administered 2016-01-12: 10 mL

## 2016-01-12 MED ORDER — LIDOCAINE HCL (CARDIAC) 20 MG/ML IV SOLN
INTRAVENOUS | Status: DC | PRN
  Administered 2016-01-12: 40 mg via INTRAVENOUS

## 2016-01-12 MED ORDER — FENTANYL CITRATE (PF) 250 MCG/5ML IJ SOLN
INTRAMUSCULAR | Status: AC
  Filled 2016-01-12: qty 5

## 2016-01-12 MED ORDER — DOCUSATE SODIUM 100 MG PO CAPS
100.0000 mg | ORAL_CAPSULE | Freq: Two times a day (BID) | ORAL | Status: DC
  Administered 2016-01-12: 100 mg via ORAL
  Filled 2016-01-12: qty 1

## 2016-01-12 MED ORDER — SIMVASTATIN 20 MG PO TABS
20.0000 mg | ORAL_TABLET | Freq: Every day | ORAL | Status: DC
  Administered 2016-01-12: 20 mg via ORAL
  Filled 2016-01-12: qty 1

## 2016-01-12 MED ORDER — PROPOFOL 10 MG/ML IV BOLUS
INTRAVENOUS | Status: DC | PRN
  Administered 2016-01-12: 200 mg via INTRAVENOUS

## 2016-01-12 MED ORDER — LIDOCAINE 2% (20 MG/ML) 5 ML SYRINGE
INTRAMUSCULAR | Status: AC
  Filled 2016-01-12: qty 5

## 2016-01-12 MED ORDER — ACETAMINOPHEN 10 MG/ML IV SOLN
INTRAVENOUS | Status: AC
  Filled 2016-01-12: qty 100

## 2016-01-12 MED ORDER — LORATADINE 10 MG PO TABS: 10.0000 mg | ORAL_TABLET | Freq: Every day | ORAL | Status: DC

## 2016-01-12 MED ORDER — NEOSTIGMINE METHYLSULFATE 10 MG/10ML IV SOLN
INTRAVENOUS | Status: DC | PRN
  Administered 2016-01-12: 4 mg via INTRAVENOUS
  Administered 2016-01-12: 1 mg via INTRAVENOUS

## 2016-01-12 MED ORDER — ONDANSETRON HCL 4 MG/2ML IJ SOLN
INTRAMUSCULAR | Status: DC | PRN
Start: 2016-01-12 — End: 2016-01-12
  Administered 2016-01-12: 4 mg via INTRAVENOUS

## 2016-01-12 MED ORDER — TOPIRAMATE 100 MG PO TABS
100.0000 mg | ORAL_TABLET | Freq: Two times a day (BID) | ORAL | Status: DC
  Administered 2016-01-12: 100 mg via ORAL
  Filled 2016-01-12 (×2): qty 1

## 2016-01-12 MED ORDER — PROMETHAZINE HCL 25 MG/ML IJ SOLN: 6.2500 mg | INTRAMUSCULAR | Status: DC | PRN

## 2016-01-12 MED ORDER — CEFAZOLIN SODIUM-DEXTROSE 2-4 GM/100ML-% IV SOLN
2.0000 g | Freq: Three times a day (TID) | INTRAVENOUS | Status: DC
  Administered 2016-01-12: 2 g via INTRAVENOUS

## 2016-01-12 MED ORDER — EPHEDRINE SULFATE 50 MG/ML IJ SOLN
INTRAMUSCULAR | Status: DC | PRN
  Administered 2016-01-12 (×2): 5 mg via INTRAVENOUS
  Administered 2016-01-12: 10 mg via INTRAVENOUS
  Administered 2016-01-12: 5 mg via INTRAVENOUS

## 2016-01-12 MED ORDER — THROMBIN 5000 UNITS EX SOLR
CUTANEOUS | Status: DC | PRN
  Administered 2016-01-12: 10000 [IU] via TOPICAL

## 2016-01-12 MED ORDER — LACTATED RINGERS IV SOLN
INTRAVENOUS | Status: DC
  Administered 2016-01-12 (×3): via INTRAVENOUS

## 2016-01-12 MED ORDER — ROCURONIUM BROMIDE 50 MG/5ML IV SOLN
INTRAVENOUS | Status: AC
  Filled 2016-01-12: qty 1

## 2016-01-12 MED ORDER — SPIRONOLACTONE 25 MG PO TABS
25.0000 mg | ORAL_TABLET | Freq: Two times a day (BID) | ORAL | Status: DC
  Administered 2016-01-12 – 2016-01-13 (×2): 25 mg via ORAL
  Filled 2016-01-12 (×3): qty 1

## 2016-01-12 MED ORDER — BISACODYL 10 MG RE SUPP: 10.0000 mg | Freq: Every day | RECTAL | Status: DC | PRN

## 2016-01-12 MED ORDER — MORPHINE SULFATE (PF) 2 MG/ML IV SOLN: 1.0000 mg | INTRAVENOUS | Status: DC | PRN

## 2016-01-12 MED ORDER — PANTOPRAZOLE SODIUM 40 MG PO TBEC: 40.0000 mg | DELAYED_RELEASE_TABLET | Freq: Every day | ORAL | Status: DC

## 2016-01-12 MED ORDER — ALUM & MAG HYDROXIDE-SIMETH 200-200-20 MG/5ML PO SUSP
30.0000 mL | Freq: Four times a day (QID) | ORAL | Status: DC | PRN
  Administered 2016-01-13: 30 mL via ORAL
  Filled 2016-01-12: qty 30

## 2016-01-12 MED ORDER — CARVEDILOL 6.25 MG PO TABS
12.5000 mg | ORAL_TABLET | Freq: Two times a day (BID) | ORAL | Status: DC
  Administered 2016-01-12 – 2016-01-13 (×2): 12.5 mg via ORAL
  Filled 2016-01-12 (×2): qty 2

## 2016-01-12 MED ORDER — DULOXETINE HCL 30 MG PO CPEP: 30.0000 mg | ORAL_CAPSULE | Freq: Every day | ORAL | Status: DC

## 2016-01-12 MED ORDER — ONDANSETRON HCL 4 MG/2ML IJ SOLN
4.0000 mg | INTRAMUSCULAR | Status: DC | PRN
  Administered 2016-01-12: 4 mg via INTRAVENOUS
  Filled 2016-01-12: qty 2

## 2016-01-12 MED ORDER — BACITRACIN ZINC 500 UNIT/GM EX OINT
TOPICAL_OINTMENT | CUTANEOUS | Status: DC | PRN
Start: 2016-01-12 — End: 2016-01-12
  Administered 2016-01-12: 1 via TOPICAL

## 2016-01-12 MED ORDER — SODIUM CHLORIDE 0.9 % IR SOLN
Status: DC | PRN
  Administered 2016-01-12: 10:00:00

## 2016-01-12 MED ORDER — MENTHOL 3 MG MT LOZG
1.0000 | LOZENGE | OROMUCOSAL | Status: DC | PRN
  Filled 2016-01-12: qty 9

## 2016-01-12 MED ORDER — HEMOSTATIC AGENTS (NO CHARGE) OPTIME
TOPICAL | Status: DC | PRN
  Administered 2016-01-12: 1 via TOPICAL

## 2016-01-12 MED ORDER — CEFAZOLIN SODIUM-DEXTROSE 2-4 GM/100ML-% IV SOLN
2.0000 g | Freq: Three times a day (TID) | INTRAVENOUS | Status: AC
  Administered 2016-01-12 – 2016-01-13 (×2): 2 g via INTRAVENOUS
  Filled 2016-01-12 (×2): qty 100

## 2016-01-12 MED ORDER — MIDAZOLAM HCL 5 MG/5ML IJ SOLN
INTRAMUSCULAR | Status: DC | PRN
  Administered 2016-01-12: 2 mg via INTRAVENOUS

## 2016-01-12 MED ORDER — ROCURONIUM BROMIDE 100 MG/10ML IV SOLN
INTRAVENOUS | Status: DC | PRN
  Administered 2016-01-12: 40 mg via INTRAVENOUS

## 2016-01-12 MED ORDER — ACETAMINOPHEN 325 MG PO TABS: 650.0000 mg | ORAL_TABLET | ORAL | Status: DC | PRN

## 2016-01-12 MED ORDER — ROPINIROLE HCL 1 MG PO TABS
0.5000 mg | ORAL_TABLET | Freq: Three times a day (TID) | ORAL | Status: DC
Start: 2016-01-12 — End: 2016-01-13
  Administered 2016-01-12: 0.5 mg via ORAL
  Filled 2016-01-12: qty 1

## 2016-01-12 MED ORDER — HYDROMORPHONE HCL 1 MG/ML IJ SOLN: 0.2500 mg | INTRAMUSCULAR | Status: DC | PRN

## 2016-01-12 MED ORDER — GLYCOPYRROLATE 0.2 MG/ML IJ SOLN
INTRAMUSCULAR | Status: DC | PRN
  Administered 2016-01-12: .8 mg via INTRAVENOUS
  Administered 2016-01-12: .2 mg via INTRAVENOUS

## 2016-01-12 MED ORDER — DEXTROSE 5 % IV SOLN
10.0000 mg | INTRAVENOUS | Status: DC | PRN
  Administered 2016-01-12: 25 ug/min via INTRAVENOUS

## 2016-01-12 SURGICAL SUPPLY — 45 items
BAG DECANTER FOR FLEXI CONT (MISCELLANEOUS) ×2 IMPLANT
BENZOIN TINCTURE PRP APPL 2/3 (GAUZE/BANDAGES/DRESSINGS) ×2 IMPLANT
BLADE CLIPPER SURG (BLADE) IMPLANT
BRUSH SCRUB EZ PLAIN DRY (MISCELLANEOUS) ×2 IMPLANT
BUR MATCHSTICK NEURO 3.0 LAGG (BURR) ×2 IMPLANT
BUR PRECISION FLUTE 6.0 (BURR) ×2 IMPLANT
CANISTER SUCT 3000ML PPV (MISCELLANEOUS) ×2 IMPLANT
DRAPE LAPAROTOMY 100X72X124 (DRAPES) ×2 IMPLANT
DRAPE MICROSCOPE LEICA (MISCELLANEOUS) ×2 IMPLANT
DRAPE POUCH INSTRU U-SHP 10X18 (DRAPES) ×2 IMPLANT
DRAPE SURG 17X23 STRL (DRAPES) ×8 IMPLANT
ELECT BLADE 4.0 EZ CLEAN MEGAD (MISCELLANEOUS) ×2
ELECT REM PT RETURN 9FT ADLT (ELECTROSURGICAL) ×2
ELECTRODE BLDE 4.0 EZ CLN MEGD (MISCELLANEOUS) ×1 IMPLANT
ELECTRODE REM PT RTRN 9FT ADLT (ELECTROSURGICAL) ×1 IMPLANT
GAUZE SPONGE 4X4 12PLY STRL (GAUZE/BANDAGES/DRESSINGS) ×2 IMPLANT
GAUZE SPONGE 4X4 16PLY XRAY LF (GAUZE/BANDAGES/DRESSINGS) IMPLANT
GLOVE BIO SURGEON STRL SZ8 (GLOVE) ×2 IMPLANT
GLOVE BIO SURGEON STRL SZ8.5 (GLOVE) ×2 IMPLANT
GLOVE EXAM NITRILE LRG STRL (GLOVE) IMPLANT
GLOVE EXAM NITRILE MD LF STRL (GLOVE) IMPLANT
GLOVE EXAM NITRILE XL STR (GLOVE) IMPLANT
GLOVE EXAM NITRILE XS STR PU (GLOVE) IMPLANT
GOWN STRL REUS W/ TWL LRG LVL3 (GOWN DISPOSABLE) IMPLANT
GOWN STRL REUS W/ TWL XL LVL3 (GOWN DISPOSABLE) ×1 IMPLANT
GOWN STRL REUS W/TWL 2XL LVL3 (GOWN DISPOSABLE) IMPLANT
GOWN STRL REUS W/TWL LRG LVL3 (GOWN DISPOSABLE)
GOWN STRL REUS W/TWL XL LVL3 (GOWN DISPOSABLE) ×1
KIT BASIN OR (CUSTOM PROCEDURE TRAY) ×2 IMPLANT
KIT ROOM TURNOVER OR (KITS) ×2 IMPLANT
NEEDLE HYPO 21X1.5 SAFETY (NEEDLE) IMPLANT
NEEDLE HYPO 22GX1.5 SAFETY (NEEDLE) ×2 IMPLANT
NS IRRIG 1000ML POUR BTL (IV SOLUTION) ×2 IMPLANT
PACK LAMINECTOMY NEURO (CUSTOM PROCEDURE TRAY) ×2 IMPLANT
PAD ARMBOARD 7.5X6 YLW CONV (MISCELLANEOUS) ×6 IMPLANT
PATTIES SURGICAL .5 X1 (DISPOSABLE) IMPLANT
RUBBERBAND STERILE (MISCELLANEOUS) ×4 IMPLANT
SPONGE SURGIFOAM ABS GEL SZ50 (HEMOSTASIS) ×2 IMPLANT
STRIP CLOSURE SKIN 1/2X4 (GAUZE/BANDAGES/DRESSINGS) ×2 IMPLANT
SUT VIC AB 1 CT1 18XBRD ANBCTR (SUTURE) ×1 IMPLANT
SUT VIC AB 1 CT1 8-18 (SUTURE) ×1
SUT VIC AB 2-0 CP2 18 (SUTURE) ×2 IMPLANT
TOWEL OR 17X24 6PK STRL BLUE (TOWEL DISPOSABLE) ×2 IMPLANT
TOWEL OR 17X26 10 PK STRL BLUE (TOWEL DISPOSABLE) ×2 IMPLANT
WATER STERILE IRR 1000ML POUR (IV SOLUTION) ×2 IMPLANT

## 2016-01-12 NOTE — Anesthesia Procedure Notes (Signed)
Procedure Name: Intubation Date/Time: 01/12/2016 10:18 AM Performed by: Oletta Lamas Pre-anesthesia Checklist: Patient identified, Emergency Drugs available, Suction available and Patient being monitored Patient Re-evaluated:Patient Re-evaluated prior to inductionOxygen Delivery Method: Circle System Utilized Preoxygenation: Pre-oxygenation with 100% oxygen Intubation Type: IV induction Ventilation: Mask ventilation without difficulty Laryngoscope Size: Mac and 3 Grade View: Grade I Tube type: Oral Number of attempts: 1 Airway Equipment and Method: Stylet Placement Confirmation: ETT inserted through vocal cords under direct vision,  positive ETCO2 and breath sounds checked- equal and bilateral Tube secured with: Tape Dental Injury: Teeth and Oropharynx as per pre-operative assessment

## 2016-01-12 NOTE — Anesthesia Preprocedure Evaluation (Addendum)
Anesthesia Evaluation  Patient identified by MRN, date of birth, ID band Patient awake    Reviewed: Allergy & Precautions, NPO status , Patient's Chart, lab work & pertinent test results, reviewed documented beta blocker date and time   Airway Mallampati: II  TM Distance: >3 FB Neck ROM: Full    Dental  (+) Dental Advisory Given   Pulmonary sleep apnea ,    breath sounds clear to auscultation       Cardiovascular hypertension, Pt. on medications and Pt. on home beta blockers  Rhythm:Regular Rate:Normal     Neuro/Psych  Headaches, Anxiety Depression    GI/Hepatic Neg liver ROS, GERD  Medicated,  Endo/Other  diabetes  Renal/GU negative Renal ROS     Musculoskeletal  (+) Arthritis ,   Abdominal   Peds  Hematology  (+) anemia ,   Anesthesia Other Findings   Reproductive/Obstetrics                            Lab Results  Component Value Date   WBC 9.0 01/09/2016   HGB 11.6* 01/09/2016   HCT 38.8 01/09/2016   MCV 82.6 01/09/2016   PLT 339 01/09/2016   Lab Results  Component Value Date   CREATININE 0.78 01/09/2016   BUN 13 01/09/2016   NA 140 01/09/2016   K 4.3 01/09/2016   CL 112* 01/09/2016   CO2 20* 01/09/2016    Anesthesia Physical Anesthesia Plan  ASA: II  Anesthesia Plan: General   Post-op Pain Management:    Induction: Intravenous  Airway Management Planned: Oral ETT  Additional Equipment:   Intra-op Plan:   Post-operative Plan: Extubation in OR  Informed Consent: I have reviewed the patients History and Physical, chart, labs and discussed the procedure including the risks, benefits and alternatives for the proposed anesthesia with the patient or authorized representative who has indicated his/her understanding and acceptance.   Dental advisory given  Plan Discussed with: CRNA  Anesthesia Plan Comments:         Anesthesia Quick Evaluation

## 2016-01-12 NOTE — Progress Notes (Signed)
Transported per NT

## 2016-01-12 NOTE — Anesthesia Postprocedure Evaluation (Signed)
Anesthesia Post Note  Patient: Katherine [name]  Procedure(s) Performed: Procedure(s) (LRB): Bilateral L4-5 Laminotomy/Foraminotomy (Bilateral)  Patient location during evaluation: PACU Anesthesia Type: General Level of consciousness: awake and alert Pain management: pain level controlled Vital Signs Assessment: post-procedure vital signs reviewed and stable Respiratory status: spontaneous breathing, nonlabored ventilation, respiratory function stable and patient connected to nasal cannula oxygen Cardiovascular status: blood pressure returned to baseline and stable Postop Assessment: no signs of nausea or vomiting Anesthetic complications: no    Last Vitals:  Filed Vitals:   01/12/16 1245 01/12/16 1254  BP:  131/70  Pulse: 80 86  Temp:    Resp: 12 16    Last Pain:  Filed Vitals:   01/12/16 1335  PainSc: 7                  Tiajuana Amass

## 2016-01-12 NOTE — Op Note (Signed)
Brief history: The patient is a 65 year old white female who has complained of back and right leg pain consistent with a lumbar radiculopathy/neurogenic claudication. She has failed medical management and was worked up with a lumbar MRI and a lumbar myelo CT. These demonstrated lateral recess stenosis/facet arthropathy most prominent at L4-5. I discussed the situation with the patient. We have discussed the various treatment options including surgery. She has decided to proceed with a bilateral L4-5 laminotomy/foraminotomy after weighing the risks, benefits, and alternatives to surgery.  Preoperative diagnosis: L4-5 facet arthropathy, spinal stenosis, lumbar radiculopathy, lumbago, neurogenic claudication  Postoperative diagnosis: As above  Procedure: Bilateral L4-5 laminotomy/foraminotomy using micro-dissection  Surgeon: Dr. Earle Gell  Asst.: Dr. Jovita Gamma  Anesthesia: Gen. endotracheal  Estimated blood loss: Minimal  Drains: None  Complications: None  Description of procedure: The patient was brought to the operating room by the anesthesia team. General endotracheal anesthesia was induced. The patient was turned to the prone position on the Wilson frame. The patient's lumbosacral region was then prepared with Betadine scrub and Betadine solution. Sterile drapes were applied.  I then injected the area to be incised with Marcaine with epinephrine solution. I then used a scalpel to make a linear midline incision over the L4-5 intervertebral disc space. I then used electrocautery to perform a lateral  subperiosteal dissection exposing the spinous process and lamina of L4 and L5. We obtained intraoperative radiograph to confirm our location. I then inserted the [facility] retractor for exposure.  We then brought the operative microscope into the field. Under its magnification and illumination we completed the microdissection. I used a high-speed drill to perform a laminotomy at L4-5  bilaterally. I then used a Kerrison punches to widen the laminotomy and removed the ligamentum flavum at L4-5 bilaterally. We then used microdissection to free up the thecal sac and the L5 nerve root from the epidural tissue. I then used a Kerrison punch to perform a foraminotomy at about the L5 nerve root. We used the curved Kerrison punches to decompress the bilateral exiting L4 nerve roots. We inspected the intervertebral disc at L4-5 bilaterally. There were no herniations. I then palpated along the ventral surface of the thecal sac and along exit route of the bilateral L4 and L5 nerve roots and noted that the neural structures were well decompressed. This completed the decompression.  We then obtained hemostasis using bipolar electrocautery. We irrigated the wound out with bacitracin solution. We then removed the retractor. We then reapproximated the patient's thoracolumbar fascia with interrupted #1 Vicryl suture. We then reapproximated the patient's subcutaneous tissue with interrupted 2-0 Vicryl suture. We then reapproximated patient's skin with Steri-Strips and benzoin. The was then coated with bacitracin ointment. The drapes were removed. The patient was subsequently returned to the supine position where they were extubated by the anesthesia team. The patient was then transported to the postanesthesia care unit in stable condition. All sponge instrument and needle counts were reportedly correct at the end of this case.

## 2016-01-12 NOTE — H&P (Signed)
Subjective: The patient is a 65 year old white female who has complained of back and right leg pain consistent with lumbar radiculopathy/neurogenic claudication. She has failed medical management and was worked up with a lumbar MRI and a lumbar myelo CT. This demonstrated disc degeneration and stenosis at L4-5. I discussed the various treatment options including surgery. The patient has decided to proceed with a bilateral L4-5 laminotomy and foraminotomy.   Past Medical History  Diagnosis Date  . 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 Hypertension   . Hypokalemia   . Obesity   . Migraine headache   . Depression   . High cholesterol   . Chicken pox     age 38  . Chronic back pain     Compressed discs. [place]  . GERD (gastroesophageal reflux disease)   . Borderline diabetes   . Anxiety   . Arthritis   . Diabetes mellitus without complication ([place])     pt states she was borderline last year, not diabetic     Past Surgical History  Procedure Laterality Date  . Cesarean section    . Foot surgery      bilateral bunions  . Tonsillectomy  1971  . Endometrial ablation  2007  . Bladder suspension  2005  . Cardiac catheterization      Allergies  Allergen Reactions  . Codeine Anxiety  . Darvon Anxiety  . Demerol [Meperidine] Anxiety  . Hydrocodone Anxiety  . Oxycodone Anxiety  . Tramadol Anxiety  . Victoza [Liraglutide] Nausea Only and Other (See Comments)    And weakness    Social History  Substance Use Topics  . Smoking status: Never Smoker   . Smokeless tobacco: Never Used  . Alcohol Use: No    Family History  Problem Relation Age of Onset  . Heart disease Mother   . Hypertension Mother   . Heart failure Mother     CHF  . Diabetes Mother   . Cancer Mother     CERVICAL CANCER  . Heart disease Father   . Diabetes Father   . Liver disease Father    . Hypertension Father   . Diabetes Sister   . Heart disease Brother   . Heart failure Brother     CHF  . Diabetes Brother   . Cancer Brother 43    THROAT AND NECK  . Cancer Maternal Grandfather    Prior to Admission medications   Medication Sig Start Date End Date Taking? Authorizing Provider  carvedilol (COREG) 12.5 MG tablet Take 1 tablet (12.5 mg total) by mouth 2 (two) times daily. 01/08/16  Yes Minna Merritts, MD  cetirizine (ZYRTEC) 10 MG tablet Take 10 mg by mouth daily as needed for allergies.   Yes Historical Provider, MD  DULoxetine (CYMBALTA) 30 MG capsule Take 30 mg by mouth daily. 12/04/15  Yes Historical Provider, MD  DULoxetine (CYMBALTA) 60 MG capsule Take 60 mg by mouth daily.  08/30/14  Yes Historical Provider, MD  LORazepam (ATIVAN) 0.5 MG tablet Take 0.5 mg by mouth at bedtime.   Yes Historical Provider, MD  naproxen sodium (ANAPROX) 220 MG tablet Take 440 mg by mouth 2 (two) times daily with a meal.   Yes Historical Provider, MD  pantoprazole (PROTONIX) 40 MG tablet Take 1 tablet (40 mg total) by mouth daily. 04/11/15  Yes [name], NP  rOPINIRole (  REQUIP) 0.5 MG tablet TAKE 1 TABLET (0.5 MG TOTAL) BY MOUTH AT BEDTIME. 01/08/16  Yes [name], NP  simvastatin (ZOCOR) 20 MG tablet Take 1 tablet (20 mg total) by mouth daily at 6 PM. 01/08/16  Yes Minna Merritts, MD  spironolactone (ALDACTONE) 25 MG tablet Take 1 tablet (25 mg total) by mouth 2 (two) times daily. 01/09/16  Yes Minna Merritts, MD  topiramate (TOPAMAX) 100 MG tablet Take 100 mg by mouth 2 (two) times daily.  08/31/14  Yes Historical Provider, MD     Review of Systems  Positive ROS: As above  All other systems have been reviewed and were otherwise negative with the exception of those mentioned in the HPI and as above.  Objective: Vital signs in last 24 hours: Temp:  [97.9 F (36.6 C)] 97.9 F (36.6 C) (06/26 0829) Pulse Rate:  [83] 83 (06/26 0829) Resp:  [20] 20 (06/26 0829) BP:  (121)/(68) 121/68 mmHg (06/26 0829) SpO2:  [99 %] 99 % (06/26 0829) Weight:  [87.045 kg (191 lb 14.4 oz)] 87.045 kg (191 lb 14.4 oz) (06/26 0829)  General Appearance: Alert, cooperative, no distress, Head: Normocephalic, without obvious abnormality, atraumatic Eyes: PERRL, conjunctiva/corneas clear, EOM's intact,    Ears: Normal  Throat: Normal  Neck: Supple, symmetrical, trachea midline, no adenopathy; thyroid: No enlargement/tenderness/nodules; no carotid bruit or JVD Back: Symmetric, no curvature, ROM normal, no CVA tenderness Lungs: Clear to auscultation bilaterally, respirations unlabored Heart: Regular rate and rhythm, no murmur, rub or gallop Abdomen: Soft, non-tender,, no masses, no organomegaly Extremities: Extremities normal, atraumatic, no cyanosis or edema Pulses: 2+ and symmetric all extremities Skin: Skin color, texture, turgor normal, no rashes or lesions  NEUROLOGIC:   Mental status: alert and oriented, no aphasia, good attention span, Fund of knowledge/ memory ok Motor Exam - grossly normal Sensory Exam - grossly normal Reflexes:  Coordination - grossly normal Gait - grossly normal Balance - grossly normal Cranial Nerves: I: smell Not tested  II: visual acuity  OS: Normal  OD: Normal   II: visual fields Full to confrontation  II: pupils Equal, round, reactive to light  III,VII: ptosis None  III,IV,VI: extraocular muscles  Full ROM  V: mastication Normal  V: facial light touch sensation  Normal  V,VII: corneal reflex  Present  VII: facial muscle function - upper  Normal  VII: facial muscle function - lower Normal  VIII: hearing Not tested  IX: soft palate elevation  Normal  IX,X: gag reflex Present  XI: trapezius strength  5/5  XI: sternocleidomastoid strength 5/5  XI: neck flexion strength  5/5  XII: tongue strength  Normal    Data Review Lab Results  Component Value Date   WBC 9.0 01/09/2016   HGB 11.6* 01/09/2016   HCT 38.8 01/09/2016   MCV  82.6 01/09/2016   PLT 339 01/09/2016   Lab Results  Component Value Date   NA 140 01/09/2016   K 4.3 01/09/2016   CL 112* 01/09/2016   CO2 20* 01/09/2016   BUN 13 01/09/2016   CREATININE 0.78 01/09/2016   GLUCOSE 115* 01/09/2016   No results found for: INR, PROTIME  Assessment/Plan: L4-5 spinal stenosis, facet arthropathy, lumbago, lumbar radiculopathy, neurogenic claudication: I have discussed the situation with the patient and reviewed her imaging studies with her. We have discussed the various treatment options including surgery. I have described the surgical treatment option of a bilateral L4-5 laminotomy and foraminotomy. I have shown her surgical models. We have  discussed the risks, benefits, alternatives, expected postoperative course, and likelihood of achieving our goals with surgery. I have answered all the patient's questions. The patient has decided to proceed with surgery.   Mirinda Monte D 01/12/2016 9:41 AM

## 2016-01-12 NOTE — Progress Notes (Signed)
Patient ID: Katherine [name], female   DOB: July 10, 1951, 65 y.o.   MRN: [id number] Subjective:  The patient is alert and pleasant. She looks and feels well. Her back is appropriately sore. Her leg pain is gone already.  Objective: Vital signs in last 24 hours: Temp:  [97 F (36.1 C)-97.9 F (36.6 C)] 97 F (36.1 C) (06/26 1244) Pulse Rate:  [80-97] 86 (06/26 1254) Resp:  [12-24] 16 (06/26 1254) BP: (107-131)/(60-70) 131/70 mmHg (06/26 1254) SpO2:  [93 %-100 %] 96 % (06/26 1254) Weight:  [87.045 kg (191 lb 14.4 oz)] 87.045 kg (191 lb 14.4 oz) (06/26 0829)  Intake/Output from previous day:   Intake/Output this shift: Total I/O In: 1440 [P.O.:240; I.V.:1200] Out: 50 [Blood:50]  Physical exam the patient is alert and pleasant. She is moving her lower extremities well.  Lab Results: No results for input(s): WBC, HGB, HCT, PLT in the last 72 hours. BMET No results for input(s): NA, K, CL, CO2, GLUCOSE, BUN, CREATININE, CALCIUM in the last 72 hours.  Studies/Results: Dg Lumbar Spine 1 View  01/12/2016  CLINICAL DATA:  Surgery. EXAM: LUMBAR SPINE - 1 VIEW COMPARISON:  Myelogram 08/20/2015. FINDINGS: Metallic marker noted at [id number] posteriorly. Surgical sponge noted in this region. Lumbar vertebra numbered with the lowest segmented lumbar shaped appearing vertebra as L5. IMPRESSION: Negative. Electronically Signed   By: Marcello Moores  Register   On: 01/12/2016 13:17    Assessment/Plan: The patient is doing well. She may go home tomorrow.      Xavi Tomasik D 01/12/2016, 3:59 PM

## 2016-01-12 NOTE — Progress Notes (Signed)
Subjective:  The patient is somnolent but easily arousable. She is in no apparent distress.  Objective: Vital signs in last 24 hours: Temp:  [97.5 F (36.4 C)-97.9 F (36.6 C)] 97.5 F (36.4 C) (06/26 1203) Pulse Rate:  [83-97] 87 (06/26 1222) Resp:  [20-23] 23 (06/26 1222) BP: (107-127)/(60-68) 127/63 mmHg (06/26 1222) SpO2:  [93 %-99 %] 96 % (06/26 1222) Weight:  [87.045 kg (191 lb 14.4 oz)] 87.045 kg (191 lb 14.4 oz) (06/26 0829)  Intake/Output from previous day:   Intake/Output this shift: Total I/O In: 1200 [I.V.:1200] Out: 50 [Blood:50]  Physical exam the patient is somnolent but arousable. She is moving her lower extremities well.  Lab Results:  Recent Labs  01/09/16 1232  WBC 9.0  HGB 11.6*  HCT 38.8  PLT 339   BMET  Recent Labs  01/09/16 1232  NA 140  K 4.3  CL 112*  CO2 20*  GLUCOSE 115*  BUN 13  CREATININE 0.78  CALCIUM 9.4    Studies/Results: No results found.  Assessment/Plan: The patient is doing well. I spoke with her husband.      Keyshon Stein D 01/12/2016, 12:23 PM

## 2016-01-12 NOTE — Transfer of Care (Signed)
Immediate Anesthesia Transfer of Care Note  Patient: Katherine [name]  Procedure(s) Performed: Procedure(s) with comments: Bilateral L4-5 Laminotomy/Foraminotomy (Bilateral) - Bilateral L4-5 Laminotomy/Foraminotomy  Patient Location: PACU  Anesthesia Type:General  Level of Consciousness: sedated, patient cooperative and responds to stimulation  Airway & Oxygen Therapy: Patient connected to nasal cannula oxygen  Post-op Assessment: Report given to RN and Post -op Vital signs reviewed and stable  Post vital signs: stable  Last Vitals:  Filed Vitals:   01/12/16 0829  BP: 121/68  Pulse: 83  Temp: 36.6 C  Resp: 20    Last Pain:  Filed Vitals:   01/12/16 1210  PainSc: 5          Complications: No apparent anesthesia complications

## 2016-01-13 ENCOUNTER — Encounter (HOSPITAL_COMMUNITY): Payer: Self-pay | Admitting: Neurosurgery

## 2016-01-13 DIAGNOSIS — M4806 Spinal stenosis, lumbar region: Secondary | ICD-10-CM | POA: Diagnosis not present

## 2016-01-13 MED ORDER — CYCLOBENZAPRINE HCL 10 MG PO TABS: 10.0000 mg | ORAL_TABLET | Freq: Three times a day (TID) | ORAL | Status: DC | PRN

## 2016-01-13 MED ORDER — DOCUSATE SODIUM 100 MG PO CAPS: 100.0000 mg | ORAL_CAPSULE | Freq: Two times a day (BID) | ORAL | Status: DC

## 2016-01-13 MED ORDER — HYDROXYZINE HCL 25 MG PO TABS
50.0000 mg | ORAL_TABLET | Freq: Three times a day (TID) | ORAL | Status: DC | PRN
  Administered 2016-01-13: 50 mg via ORAL
  Filled 2016-01-13: qty 2

## 2016-01-13 MED ORDER — HYDROMORPHONE HCL 4 MG PO TABS: 4.0000 mg | ORAL_TABLET | ORAL | Status: DC | PRN

## 2016-01-13 NOTE — Discharge Summary (Signed)
Physician Discharge Summary  Patient ID: Katherine [name] MRN: [id number] DOB/AGE: December 22, 1950 65 y.o.  Admit date: 01/12/2016 Discharge date: 01/13/2016  Admission Diagnoses:L4-5 spinal stenosis, lumbago, lumbar radiculopathy, neurogenic claudication  Discharge Diagnoses: The same Active Problems:   Lumbar stenosis with neurogenic claudication   Discharged Condition: good  Hospital Course: I performed a bilateral L4-5 laminotomy and foraminotomy on the patient on 01/12/2016. The surgery went well.  The patient's postoperative course was unremarkable. On postoperative day #1 the patient requested discharge to home. The patient, and her husband, were given written and oral discharge instructions. All their questions were answered.  Consults: Physical therapy Significant Diagnostic Studies: None Treatments: Bilateral L4-5 laminotomy/foraminotomy using microdissection Discharge Exam: Blood pressure 117/43, pulse 111, temperature 98.3 F (36.8 C), temperature source Oral, resp. rate 18, height 5\' 3"  (1.6 m), weight 87.045 kg (191 lb 14.4 oz), SpO2 98 %. The patient is alert and pleasant. She looks well. Her strength is normal in her lower extremities. Her dressing has a small bloodstained.  Disposition: Home  Discharge Instructions    Call MD for:  difficulty breathing, headache or visual disturbances    Complete by:  As directed      Call MD for:  extreme fatigue    Complete by:  As directed      Call MD for:  hives    Complete by:  As directed      Call MD for:  persistant dizziness or light-headedness    Complete by:  As directed      Call MD for:  persistant nausea and vomiting    Complete by:  As directed      Call MD for:  redness, tenderness, or signs of infection (pain, swelling, redness, odor or green/yellow discharge around incision site)    Complete by:  As directed      Call MD for:  severe uncontrolled pain    Complete by:  As directed      Call MD for:   temperature >100.4    Complete by:  As directed      Diet - low sodium heart healthy    Complete by:  As directed      Discharge instructions    Complete by:  As directed   Call [phone] for a followup appointment. Take a stool softener while you are using pain medications.     Driving Restrictions    Complete by:  As directed   Do not drive for 2 weeks.     Increase activity slowly    Complete by:  As directed      Lifting restrictions    Complete by:  As directed   Do not lift more than 5 pounds. No excessive bending or twisting.     May shower / Bathe    Complete by:  As directed   He may shower after the pain she is removed 3 days after surgery. Leave the incision alone.     Remove dressing in 48 hours    Complete by:  As directed   Your stitches are under the scan and will dissolve by themselves. The Steri-Strips will fall off after you take a few showers. Do not rub back or pick at the wound, Leave the wound alone.            Medication List    STOP taking these medications        LORazepam 0.5 MG tablet  Commonly known as:  ATIVAN  TAKE these medications        carvedilol 12.5 MG tablet  Commonly known as:  COREG  Take 1 tablet (12.5 mg total) by mouth 2 (two) times daily.     cetirizine 10 MG tablet  Commonly known as:  ZYRTEC  Take 10 mg by mouth daily as needed for allergies.     cyclobenzaprine 10 MG tablet  Commonly known as:  FLEXERIL  Take 1 tablet (10 mg total) by mouth 3 (three) times daily as needed for muscle spasms.     docusate sodium 100 MG capsule  Commonly known as:  COLACE  Take 1 capsule (100 mg total) by mouth 2 (two) times daily.     DULoxetine 60 MG capsule  Commonly known as:  CYMBALTA  Take 60 mg by mouth daily.     DULoxetine 30 MG capsule  Commonly known as:  CYMBALTA  Take 30 mg by mouth daily.     HYDROmorphone 4 MG tablet  Commonly known as:  DILAUDID  Take 1 tablet (4 mg total) by mouth every 4 (four) hours as  needed for severe pain.     naproxen sodium 220 MG tablet  Commonly known as:  ANAPROX  Take 440 mg by mouth 2 (two) times daily with a meal.     pantoprazole 40 MG tablet  Commonly known as:  PROTONIX  Take 1 tablet (40 mg total) by mouth daily.     rOPINIRole 0.5 MG tablet  Commonly known as:  REQUIP  TAKE 1 TABLET (0.5 MG TOTAL) BY MOUTH AT BEDTIME.     simvastatin 20 MG tablet  Commonly known as:  ZOCOR  Take 1 tablet (20 mg total) by mouth daily at 6 PM.     spironolactone 25 MG tablet  Commonly known as:  ALDACTONE  Take 1 tablet (25 mg total) by mouth 2 (two) times daily.     topiramate 100 MG tablet  Commonly known as:  TOPAMAX  Take 100 mg by mouth 2 (two) times daily.         SignedOphelia Charter 01/13/2016, 7:46 AM

## 2016-01-13 NOTE — Evaluation (Signed)
Physical Therapy Evaluation Patient Details Name: Katherine [name] MRN: [id number] DOB: 1950/11/11 Today's Date: 01/13/2016   History of Present Illness  Pt is a 65 y/o female who presents s/p L4-L5 laminectomy/decompression on 01/12/16.  Clinical Impression  Pt admitted with above diagnosis. Pt currently with functional limitations due to the deficits listed below (see PT Problem List). At the time of PT eval pt was able to perform transfers and ambulation with min guard to supervision for safety. Pt will benefit from skilled PT to increase their independence and safety with mobility to allow discharge to the venue listed below.       Follow Up Recommendations Outpatient PT;Supervision for mobility/OOB    Equipment Recommendations  3in1 (PT)    Recommendations for Other Services       Precautions / Restrictions Precautions Precautions: Fall;Back Precaution Booklet Issued: Yes (comment) Precaution Comments: Reviewed handout with pt and husband. Pt was cued for back precautions during functional mobility.  Required Braces or Orthoses: Spinal Brace Spinal Brace: Lumbar corset;Applied in sitting position Restrictions Weight Bearing Restrictions: No      Mobility  Bed Mobility Overal bed mobility: Needs Assistance Bed Mobility: Rolling;Sidelying to Sit;Sit to Sidelying Rolling: Modified independent (Device/Increase time) Sidelying to sit: Supervision     Sit to sidelying: Supervision General bed mobility comments: VC's for sequencing and proper log roll technique. Pt able to complete without assistance.   Transfers Overall transfer level: Needs assistance Equipment used: None Transfers: Sit to/from Stand Sit to Stand: Supervision         General transfer comment: Pt was able to power-up to full standing position without assistance. Close supervision for safety initially as pt felt "loopy" from the pain medicine. No LOB noted.   Ambulation/Gait Ambulation/Gait  assistance: Min guard Ambulation Distance (Feet): 300 Feet Assistive device: None Gait Pattern/deviations: Step-through pattern;Decreased stride length Gait velocity: Decreased Gait velocity interpretation: Below normal speed for age/gender General Gait Details: Guarded. Small LOB with short "dip" in floor over threshold to hallway. Hands-on guarding required to recover however no assist necessary. No other losses of balance noted.   Stairs            Wheelchair Mobility    Modified Rankin (Stroke Patients Only)       Balance Overall balance assessment: Needs assistance Sitting-balance support: Feet supported;No upper extremity supported Sitting balance-Leahy Scale: Good     Standing balance support: No upper extremity supported;During functional activity Standing balance-Leahy Scale: Fair                               Pertinent Vitals/Pain Pain Assessment: Faces Faces Pain Scale: Hurts a little bit Pain Location: Incision site Pain Descriptors / Indicators: Operative site guarding;Sore Pain Intervention(s): Limited activity within patient's tolerance;Monitored during session;Repositioned    Home Living Family/patient expects to be discharged to:: Private residence Living Arrangements: Spouse/significant other Available Help at Discharge: Family;Available 24 hours/day Type of Home: House Home Access: Level entry     Home Layout: One level Home Equipment: Cane - single point;Shower seat - built in      Prior Function Level of Independence: Independent               Hand Dominance        Extremity/Trunk Assessment   Upper Extremity Assessment: Overall WFL for tasks assessed           Lower Extremity Assessment: Generalized weakness  Cervical / Trunk Assessment: Other exceptions  Communication   Communication: No difficulties  Cognition Arousal/Alertness: Awake/alert Behavior During Therapy: WFL for tasks  assessed/performed Overall Cognitive Status: Within Functional Limits for tasks assessed                      General Comments      Exercises        Assessment/Plan    PT Assessment Patient needs continued PT services  PT Diagnosis Difficulty walking;Acute pain   PT Problem List Decreased strength;Decreased range of motion;Decreased activity tolerance;Decreased balance;Decreased mobility;Decreased knowledge of use of DME;Decreased safety awareness;Decreased knowledge of precautions;Pain  PT Treatment Interventions DME instruction;Gait training;Stair training;Functional mobility training;Therapeutic activities;Therapeutic exercise;Neuromuscular re-education;Patient/family education   PT Goals (Current goals can be found in the Care Plan section) Acute Rehab PT Goals Patient Stated Goal: Home at d/c PT Goal Formulation: With patient/family Time For Goal Achievement: 01/20/16 Potential to Achieve Goals: Good    Frequency Min 5X/week   Barriers to discharge        Co-evaluation               End of Session Equipment Utilized During Treatment: Back brace Activity Tolerance: Patient tolerated treatment well Patient left: in chair;with call bell/phone within reach;with family/visitor present Nurse Communication: Mobility status    Functional Assessment Tool Used: Clinical judgement Functional Limitation: Mobility: Walking and moving around Mobility: Walking and Moving Around Current Status [id number]): At least 1 percent but less than 20 percent impaired, limited or restricted Mobility: Walking and Moving Around Goal Status [phone]): At least 1 percent but less than 20 percent impaired, limited or restricted    Time: 0815-0841 PT Time Calculation (min) (ACUTE ONLY): 26 min   Charges:   PT Evaluation $PT Eval Moderate Complexity: 1 Procedure PT Treatments $Gait Training: 8-22 mins   PT G Codes:   PT G-Codes **NOT FOR INPATIENT CLASS** Functional Assessment  Tool Used: Clinical judgement Functional Limitation: Mobility: Walking and moving around Mobility: Walking and Moving Around Current Status [id number]): At least 1 percent but less than 20 percent impaired, limited or restricted Mobility: Walking and Moving Around Goal Status [phone]): At least 1 percent but less than 20 percent impaired, limited or restricted    Rolinda Roan 01/13/2016, 8:58 AM   Rolinda Roan, PT, DPT Acute Rehabilitation Services Pager: [phone]

## 2016-01-13 NOTE — Progress Notes (Signed)
Patient alert and oriented, mae's well, voiding adequate amount of urine, swallowing without difficulty, no c/o pain. Patient discharged home with family. Script and discharged instructions given to patient. Patient and family stated understanding of d/c instructions given and has an appointment with MD. 

## 2016-01-16 ENCOUNTER — Other Ambulatory Visit: Payer: Self-pay

## 2016-01-16 MED ORDER — SPIRONOLACTONE 25 MG PO TABS: 25.0000 mg | ORAL_TABLET | Freq: Two times a day (BID) | ORAL | Status: DC

## 2016-01-16 NOTE — Telephone Encounter (Signed)
Refill sent for spironolactone 25 mg

## 2016-02-13 ENCOUNTER — Other Ambulatory Visit (HOSPITAL_COMMUNITY): Payer: Self-pay | Admitting: Obstetrics & Gynecology

## 2016-02-13 DIAGNOSIS — Z803 Family history of malignant neoplasm of breast: Secondary | ICD-10-CM

## 2016-02-13 DIAGNOSIS — Z1231 Encounter for screening mammogram for malignant neoplasm of breast: Secondary | ICD-10-CM

## 2016-02-29 ENCOUNTER — Other Ambulatory Visit: Payer: Self-pay | Admitting: Primary Care

## 2016-02-29 DIAGNOSIS — E785 Hyperlipidemia, unspecified: Secondary | ICD-10-CM

## 2016-02-29 DIAGNOSIS — E559 Vitamin D deficiency, unspecified: Secondary | ICD-10-CM

## 2016-03-04 ENCOUNTER — Other Ambulatory Visit (INDEPENDENT_AMBULATORY_CARE_PROVIDER_SITE_OTHER): Payer: BC Managed Care – PPO

## 2016-03-04 DIAGNOSIS — E785 Hyperlipidemia, unspecified: Secondary | ICD-10-CM

## 2016-03-04 DIAGNOSIS — E559 Vitamin D deficiency, unspecified: Secondary | ICD-10-CM | POA: Diagnosis not present

## 2016-03-04 DIAGNOSIS — R252 Cramp and spasm: Secondary | ICD-10-CM

## 2016-03-04 LAB — HEPATIC FUNCTION PANEL
ALK PHOS: 107 U/L (ref 39–117)
ALT: 10 U/L (ref 0–35)
AST: 14 U/L (ref 0–37)
Albumin: 4.1 g/dL (ref 3.5–5.2)
BILIRUBIN TOTAL: 0.2 mg/dL (ref 0.2–1.2)
Bilirubin, Direct: 0.1 mg/dL (ref 0.0–0.3)
Total Protein: 6.8 g/dL (ref 6.0–8.3)

## 2016-03-04 LAB — BASIC METABOLIC PANEL
BUN: 14 mg/dL (ref 6–23)
CALCIUM: 9.3 mg/dL (ref 8.4–10.5)
CO2: 24 mEq/L (ref 19–32)
Chloride: 112 mEq/L (ref 96–112)
Creatinine, Ser: 0.68 mg/dL (ref 0.40–1.20)
GFR: 92.47 mL/min (ref >60.00)
Glucose, Bld: 92 mg/dL (ref 70–99)
POTASSIUM: 4.1 meq/L (ref 3.5–5.1)
SODIUM: 142 meq/L (ref 135–145)

## 2016-03-04 LAB — LIPID PANEL
CHOLESTEROL: 164 mg/dL (ref 0–200)
HDL: 63.5 mg/dL (ref >39.00)
LDL CALC: 75 mg/dL (ref 0–99)
NONHDL: 100.58
Total CHOL/HDL Ratio: 3
Triglycerides: 129 mg/dL (ref 0.0–149.0)
VLDL: 25.8 mg/dL (ref 0.0–40.0)

## 2016-03-04 LAB — VITAMIN D 25 HYDROXY (VIT D DEFICIENCY, FRACTURES): VITD: 23.27 ng/mL — AB (ref 30.00–100.00)

## 2016-03-11 ENCOUNTER — Ambulatory Visit (INDEPENDENT_AMBULATORY_CARE_PROVIDER_SITE_OTHER): Payer: BC Managed Care – PPO | Admitting: Primary Care

## 2016-03-11 ENCOUNTER — Telehealth: Payer: Self-pay | Admitting: Primary Care

## 2016-03-11 ENCOUNTER — Encounter: Payer: Self-pay | Admitting: Primary Care

## 2016-03-11 VITALS — BP 120/70 | HR 101 | Temp 98.0°F | Ht 63.0 in | Wt 198.8 lb

## 2016-03-11 DIAGNOSIS — Z0000 Encounter for general adult medical examination without abnormal findings: Secondary | ICD-10-CM

## 2016-03-11 DIAGNOSIS — E559 Vitamin D deficiency, unspecified: Secondary | ICD-10-CM

## 2016-03-11 DIAGNOSIS — M48062 Spinal stenosis, lumbar region with neurogenic claudication: Secondary | ICD-10-CM

## 2016-03-11 DIAGNOSIS — F32A Depression, unspecified: Secondary | ICD-10-CM

## 2016-03-11 DIAGNOSIS — E785 Hyperlipidemia, unspecified: Secondary | ICD-10-CM

## 2016-03-11 DIAGNOSIS — Z13820 Encounter for screening for osteoporosis: Secondary | ICD-10-CM

## 2016-03-11 DIAGNOSIS — R7303 Prediabetes: Secondary | ICD-10-CM

## 2016-03-11 DIAGNOSIS — D649 Anemia, unspecified: Secondary | ICD-10-CM

## 2016-03-11 DIAGNOSIS — M4806 Spinal stenosis, lumbar region: Secondary | ICD-10-CM

## 2016-03-11 DIAGNOSIS — E2839 Other primary ovarian failure: Secondary | ICD-10-CM

## 2016-03-11 DIAGNOSIS — F329 Major depressive disorder, single episode, unspecified: Secondary | ICD-10-CM

## 2016-03-11 NOTE — Telephone Encounter (Signed)
Noted. New order placed with new diagnosis code

## 2016-03-11 NOTE — Telephone Encounter (Signed)
Cherish called from [place]. Needs DEXA order changed with new dx code. Insurance will not cover screening for osteopetrosis.  Please advise

## 2016-03-11 NOTE — Assessment & Plan Note (Signed)
Immunizations UTD. Pap and Colonoscopy UTD. Mammogram and Bone Density due, pending. Exam unremarkable. Labs stable. Encouraged daily exercise and healthy diet. Follow up in 1 year for repeat physical.

## 2016-03-11 NOTE — Assessment & Plan Note (Signed)
Feels well managed. Following with psychiatry. New addition of Abilify and clorazepate. Continue cymbalta and topamax.

## 2016-03-11 NOTE — Assessment & Plan Note (Signed)
Improvement in A1C to 5.8. Encouraged her to continue walking daily. Continue healthy diet. Will continue to monitor.

## 2016-03-11 NOTE — Patient Instructions (Signed)
Your vitamin D level is too low. Start Vitamin D 1000 unit capsules once daily.  You will be contacted regarding your bone density scan.  Please let us know if you have not heard back within one week.   Continue your efforts towards weight loss through exercise and healthy diet.   Follow up in 1 year for repeat physical or sooner if needed.  It was a pleasure to see you today!

## 2016-03-11 NOTE — Assessment & Plan Note (Signed)
Level of 23 on recent labs. Start Vitamin D 1000 units. Bone density pending.

## 2016-03-11 NOTE — Progress Notes (Signed)
Pre visit review using our clinic review tool, if applicable. No additional management support is needed unless otherwise documented below in the visit note. 

## 2016-03-11 NOTE — Assessment & Plan Note (Signed)
Lipids stable on recent labs. LFT's WNL. Continue statin.

## 2016-03-11 NOTE — Progress Notes (Signed)
Subjective:    Patient ID: Katherine [name], female    DOB: [birth year], 65 y.o.   MRN: [id number]  HPI  Katherine [name] is a 65 year old female who presents today for complete physical.  Immunizations: -Tetanus: Completed in 2016 -Influenza: Completed in Fall 2016 -Pneumonia: Completed in 2016 -Shingles: Completed in 2010  Diet: She endorses a healthy diet. Breakfast: Oatmeal, fruit, smoothie Lunch: Skips Dinner: [employer], vegetables Snacks: None Desserts: 3 times weekly Beverages: Water, unsweet tea  Exercise: She does not currently exercise, has started walking Eye exam: Due in September 2017 Dental exam: Completes semi-annually Colonoscopy: Completed in 2009, due in 2019 Dexa: Due Pap Smear: Completed in Fall 2015, normal Mammogram: Completed in 2016, Due next week.   Review of Systems  Constitutional: Negative for unexpected weight change.  HENT: Negative for rhinorrhea.   Respiratory: Negative for cough and shortness of breath.   Cardiovascular: Negative for chest pain.  Gastrointestinal: Negative for constipation and diarrhea.       Taking chewable fiber and stool softner  Genitourinary: Negative for difficulty urinating and menstrual problem.  Musculoskeletal: Positive for back pain. Negative for arthralgias and myalgias.       Overall improvement in back pain since surgery  Skin: Negative for rash.  Allergic/Immunologic: Negative for environmental allergies.  Neurological: Negative for dizziness, numbness and headaches.       Occasional right lower extremity numbness  Psychiatric/Behavioral: Negative for suicidal ideas.       Follows with psychiatry, feeling well managed.       Past Medical History:  Diagnosis Date  . Anxiety   . Arthritis   . Borderline diabetes   . 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 Chicken pox    age 80  . Chronic back pain    Compressed discs. [place]  . Depression   . Diabetes mellitus without complication ([place])    pt states she was borderline last year, not diabetic   . GERD (gastroesophageal reflux disease)   . High cholesterol   . Hypertension   . Hypokalemia   . Migraine headache   . Obesity      Social History   Social History  . Marital status: Married    Spouse name: N/A  . Number of children: N/A  . Years of education: N/A   Occupational History  . Teacher [employer]   Social History Main Topics  . Smoking status: Never Smoker  . Smokeless tobacco: Never Used  . Alcohol use No  . Drug use: No  . Sexual activity: Yes    Partners: Male    Birth control/ protection: Post-menopausal   Other Topics Concern  . Not on file   Social History Narrative   Married.   One son is 1, lives in [place].   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   Enjoys substitute teaching, gardening, cooking.    Past Surgical History:  Procedure Laterality Date  . BLADDER SUSPENSION  2005  . CARDIAC CATHETERIZATION    . CESAREAN SECTION    . ENDOMETRIAL ABLATION  2007  . FOOT SURGERY     bilateral bunions  . LUMBAR LAMINECTOMY/DECOMPRESSION MICRODISCECTOMY Bilateral 01/12/2016   Procedure: Bilateral L4-5 Laminotomy/Foraminotomy;  Surgeon: Newman Pies, MD;  Location: [facility];  Service: Neurosurgery;  Laterality: Bilateral;  Bilateral L4-5 Laminotomy/Foraminotomy  . [place]  History  Problem Relation Age of Onset  . Heart disease Mother   . Hypertension Mother   . Heart failure Mother     CHF  . Diabetes Mother   . Cancer Mother     CERVICAL CANCER  . Heart disease Father   . Diabetes Father   . Liver disease Father   . Hypertension Father   . Diabetes Sister   . Heart disease Brother   . Heart failure Brother     CHF  . Diabetes Brother   . Cancer Brother 50    THROAT AND NECK  . Cancer Maternal Grandfather     Allergies  Allergen  Reactions  . Codeine Anxiety  . Darvon Anxiety  . Demerol [Meperidine] Anxiety  . Hydrocodone Anxiety  . Oxycodone Anxiety  . Tramadol Anxiety  . Victoza [Liraglutide] Nausea Only and Other (See Comments)    And weakness    Current Outpatient Prescriptions on File Prior to Visit  Medication Sig Dispense Refill  . carvedilol (COREG) 12.5 MG tablet Take 1 tablet (12.5 mg total) by mouth 2 (two) times daily. 180 tablet 3  . cetirizine (ZYRTEC) 10 MG tablet Take 10 mg by mouth daily as needed for allergies.    . DULoxetine (CYMBALTA) 30 MG capsule Take 30 mg by mouth daily.  0  . DULoxetine (CYMBALTA) 60 MG capsule Take 60 mg by mouth daily.   0  . naproxen sodium (ANAPROX) 220 MG tablet Take 440 mg by mouth 2 (two) times daily with a meal.    . pantoprazole (PROTONIX) 40 MG tablet Take 1 tablet (40 mg total) by mouth daily. 30 tablet 11  . simvastatin (ZOCOR) 20 MG tablet Take 1 tablet (20 mg total) by mouth daily at 6 PM. 90 tablet 3  . spironolactone (ALDACTONE) 25 MG tablet Take 1 tablet (25 mg total) by mouth 2 (two) times daily. 60 tablet 3  . topiramate (TOPAMAX) 100 MG tablet Take 100 mg by mouth 2 (two) times daily.   1  . cyclobenzaprine (FLEXERIL) 10 MG tablet Take 1 tablet (10 mg total) by mouth 3 (three) times daily as needed for muscle spasms. (Patient not taking: Reported on 03/11/2016) 50 tablet 1   No current facility-administered medications on file prior to visit.     BP 120/70   Pulse (!) 101   Temp 98 F (36.7 C) (Oral)   Ht 5\' 3"  (1.6 m)   Wt 198 lb 12.8 oz (90.2 kg)   SpO2 98%   BMI 35.22 kg/m    Objective:   Physical Exam  Constitutional: She is oriented to person, place, and time. She appears well-nourished.  HENT:  Right Ear: Tympanic membrane and ear canal normal.  Left Ear: Tympanic membrane and ear canal normal.  Nose: Nose normal.  Mouth/Throat: Oropharynx is clear and moist.  Eyes: Conjunctivae and EOM are normal. Pupils are equal, round, and  reactive to light.  Neck: Neck supple. No thyromegaly present.  Cardiovascular: Normal rate and regular rhythm.   No murmur heard. Pulmonary/Chest: Effort normal and breath sounds normal. She has no rales.  Abdominal: Soft. Bowel sounds are normal. There is no tenderness.  Musculoskeletal: Normal range of motion.  Ambulatory in clinic without difficulty. Movement from chair to exam table without difficulty.  Lymphadenopathy:    She has no cervical adenopathy.  Neurological: She is alert and oriented to person, place, and time. She has normal reflexes. No cranial nerve deficit.  Skin: Skin is warm  and dry. No rash noted.  Psychiatric: She has a normal mood and affect.          Assessment & Plan:

## 2016-03-11 NOTE — Assessment & Plan Note (Signed)
Completed surgery in June 2017. Overall feeling improved, does still experience some discomfort. Encouraged daily walking.

## 2016-03-11 NOTE — Assessment & Plan Note (Signed)
Stable in late June 2017.

## 2016-03-17 ENCOUNTER — Ambulatory Visit
Admission: RE | Admit: 2016-03-17 | Discharge: 2016-03-17 | Disposition: A | Discharge status: Home / Self Care | Payer: BC Managed Care – PPO | Source: Ambulatory Visit | Attending: Obstetrics & Gynecology | Admitting: Obstetrics & Gynecology

## 2016-03-17 DIAGNOSIS — Z1231 Encounter for screening mammogram for malignant neoplasm of breast: Secondary | ICD-10-CM

## 2016-03-17 DIAGNOSIS — Z803 Family history of malignant neoplasm of breast: Secondary | ICD-10-CM

## 2016-03-23 ENCOUNTER — Other Ambulatory Visit: Payer: BC Managed Care – PPO

## 2016-03-25 ENCOUNTER — Other Ambulatory Visit: Payer: BC Managed Care – PPO

## 2016-03-29 ENCOUNTER — Encounter: Payer: BC Managed Care – PPO | Admitting: Primary Care

## 2016-04-01 ENCOUNTER — Other Ambulatory Visit: Payer: Self-pay | Admitting: Primary Care

## 2016-04-01 DIAGNOSIS — K219 Gastro-esophageal reflux disease without esophagitis: Secondary | ICD-10-CM

## 2016-04-02 ENCOUNTER — Other Ambulatory Visit: Payer: BC Managed Care – PPO

## 2016-04-08 ENCOUNTER — Ambulatory Visit
Admission: RE | Admit: 2016-04-08 | Discharge: 2016-04-08 | Disposition: A | Discharge status: Home / Self Care | Payer: BC Managed Care – PPO | Source: Ambulatory Visit | Attending: Primary Care | Admitting: Primary Care

## 2016-04-08 DIAGNOSIS — E559 Vitamin D deficiency, unspecified: Secondary | ICD-10-CM

## 2016-04-08 DIAGNOSIS — E2839 Other primary ovarian failure: Secondary | ICD-10-CM

## 2016-04-23 ENCOUNTER — Other Ambulatory Visit: Payer: Self-pay | Admitting: Neurosurgery

## 2016-04-23 DIAGNOSIS — G8929 Other chronic pain: Secondary | ICD-10-CM

## 2016-04-23 DIAGNOSIS — M545 Low back pain: Principal | ICD-10-CM

## 2016-04-30 ENCOUNTER — Ambulatory Visit
Admission: RE | Admit: 2016-04-30 | Discharge: 2016-04-30 | Disposition: A | Discharge status: Home / Self Care | Payer: BC Managed Care – PPO | Source: Ambulatory Visit | Attending: Neurosurgery | Admitting: Neurosurgery

## 2016-04-30 DIAGNOSIS — M545 Low back pain: Principal | ICD-10-CM

## 2016-04-30 DIAGNOSIS — G8929 Other chronic pain: Secondary | ICD-10-CM

## 2016-04-30 MED ORDER — GADOBENATE DIMEGLUMINE 529 MG/ML IV SOLN
18.0000 mL | Freq: Once | INTRAVENOUS | Status: AC | PRN
  Administered 2016-04-30: 18 mL via INTRAVENOUS

## 2016-05-07 ENCOUNTER — Ambulatory Visit (INDEPENDENT_AMBULATORY_CARE_PROVIDER_SITE_OTHER): Payer: BC Managed Care – PPO

## 2016-05-07 DIAGNOSIS — Z23 Encounter for immunization: Secondary | ICD-10-CM | POA: Diagnosis not present

## 2016-05-17 ENCOUNTER — Other Ambulatory Visit: Payer: Self-pay | Admitting: *Deleted

## 2016-05-17 MED ORDER — SPIRONOLACTONE 25 MG PO TABS: 25.0000 mg | ORAL_TABLET | Freq: Two times a day (BID) | ORAL | 3 refills | Status: DC

## 2016-06-08 ENCOUNTER — Other Ambulatory Visit: Payer: Self-pay | Admitting: Neurosurgery

## 2016-06-15 ENCOUNTER — Ambulatory Visit: Payer: BC Managed Care – PPO | Admitting: Physical Therapy

## 2016-06-21 ENCOUNTER — Encounter: Payer: BC Managed Care – PPO | Admitting: Physical Therapy

## 2016-06-28 ENCOUNTER — Encounter: Payer: BC Managed Care – PPO | Admitting: Physical Therapy

## 2016-07-05 ENCOUNTER — Telehealth: Payer: Self-pay | Admitting: Primary Care

## 2016-07-05 NOTE — Telephone Encounter (Signed)
Pt has appt with Dr Glori Bickers 07/06/16 at 9 Am.

## 2016-07-05 NOTE — Telephone Encounter (Signed)
Patient Name: Katherine [name]  DOB: 07-27-1950    Initial Comment Caller states she got dizzy and she fell. She has some bruising on her left side. Right side, she is hurting really bad, hurts to take a deep breath.   Nurse Assessment  Nurse: Raphael Gibney, RN, Vanita Ingles Date/Time (Eastern Time): 07/05/2016 9:12:46 AM  Confirm and document reason for call. If symptomatic, describe symptoms. ---Caller states she got dizzy and fell Thursday night Has a bruise on the left side at the rib area. No bruise on her right side at her rib area. hurts when she coughs or laughs. Pain started on Friday. She is able to take a deep breath but it hurts.  Does the patient have any new or worsening symptoms? ---Yes  Will a triage be completed? ---Yes  Related visit to physician within the last 2 weeks? ---No  Does the PT have any chronic conditions? (i.e. diabetes, asthma, etc.) ---Yes  List chronic conditions. ---HTN  Is this a behavioral health or substance abuse call? ---No     Guidelines    Guideline Title Affirmed Question Affirmed Notes  Chest Injury [1] High-risk adult (e.g., age > 75, osteoporosis, chronic steroid use) AND [2] still hurts    Final Disposition User   See Physician within 24 Hours [place], RN, Vera    Comments  Called secondary number and left message. Will try primary number.  called primary number and left message. Will call secondary number in a few min.  appt scheduled for 07/06/2016 at 9 am with Dr. Loura Pardon   Referrals  REFERRED TO PCP OFFICE   Disagree/Comply: Comply

## 2016-07-05 NOTE — Telephone Encounter (Signed)
Will see her then 

## 2016-07-06 ENCOUNTER — Ambulatory Visit (INDEPENDENT_AMBULATORY_CARE_PROVIDER_SITE_OTHER): Payer: BC Managed Care – PPO | Admitting: Family Medicine

## 2016-07-06 ENCOUNTER — Encounter: Payer: Self-pay | Admitting: Family Medicine

## 2016-07-06 ENCOUNTER — Ambulatory Visit (INDEPENDENT_AMBULATORY_CARE_PROVIDER_SITE_OTHER)
Admission: RE | Admit: 2016-07-06 | Discharge: 2016-07-06 | Disposition: A | Discharge status: Home / Self Care | Payer: BC Managed Care – PPO | Source: Ambulatory Visit | Attending: Family Medicine | Admitting: Family Medicine

## 2016-07-06 VITALS — BP 128/84 | HR 93 | Temp 97.4°F | Ht 63.0 in | Wt 209.5 lb

## 2016-07-06 DIAGNOSIS — R0781 Pleurodynia: Secondary | ICD-10-CM

## 2016-07-06 DIAGNOSIS — Y92009 Unspecified place in unspecified non-institutional (private) residence as the place of occurrence of the external cause: Secondary | ICD-10-CM

## 2016-07-06 DIAGNOSIS — W19XXXA Unspecified fall, initial encounter: Secondary | ICD-10-CM | POA: Diagnosis not present

## 2016-07-06 DIAGNOSIS — Y92099 Unspecified place in other non-institutional residence as the place of occurrence of the external cause: Secondary | ICD-10-CM

## 2016-07-06 NOTE — Patient Instructions (Signed)
Use intermittent ice and heat on the painful area  Brace your ribs with a pillow for deep breaths or coughing or sneezing  Do not change position quickly  If symptoms suddenly worsen please alert [place] (especially if short of breath or cough or fever) - or go to the ER Continue naproxen if it helps pain  Xray now- we will alert you when this returns

## 2016-07-06 NOTE — Assessment & Plan Note (Signed)
With rib inj- pending xray  This happened getting up at night- ? If hypotensive or vertigo-brief  Disc in detail -fall prev  F/u PCP if dizziness re occurs

## 2016-07-06 NOTE — Progress Notes (Signed)
Pre visit review using our clinic review tool, if applicable. No additional management support is needed unless otherwise documented below in the visit note. 

## 2016-07-06 NOTE — Progress Notes (Signed)
Subjective:    Patient ID: Katherine [name], female    DOB: 1951/02/05, 65 y.o.   MRN: [id number]  HPI Here for rib pain after a fall   Last week -wednesday Got up out of bed dizzy   (room was spinning)  [place] on her L side- is bruised  Just hit the floor  She has a lot of pain on R side in lower ribs - hurts to cough or laugh or sneeze or deep breath   No hx of neuro problems  ? If it was vertigo  Does have chronic back problems   Has not put heat or ice on it  Has taken naproxen -helped only a little bit   Patient Active Problem List   Diagnosis Date Noted  . Vitamin D deficiency 03/11/2016  . Lumbar stenosis with neurogenic claudication 01/12/2016  . Notalgia 01/08/2016  . Restless leg syndrome 03/13/2015  . Preventative health care 12/10/2014  . Other fatigue 11/04/2014  . Anemia 11/04/2014  . Depression 10/31/2014  . Prediabetes 10/31/2014  . Obstructive sleep apnea 05/01/2009  . Hyperlipidemia 09/04/2008  . Obesity 09/04/2008  . HYPERTENSION, BENIGN 09/04/2008  . CHEST PAIN-UNSPECIFIED 09/04/2008   Past Medical History:  Diagnosis Date  . Anxiety   . Arthritis   . Borderline diabetes   . 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 Chicken pox    age 63  . Chronic back pain    Compressed discs. [place]  . Depression   . Diabetes mellitus without complication ([place])    pt states she was borderline last year, not diabetic   . GERD (gastroesophageal reflux disease)   . High cholesterol   . Hypertension   . Hypokalemia   . Migraine headache   . Obesity    Past Surgical History:  Procedure Laterality Date  . BLADDER SUSPENSION  2005  . CARDIAC CATHETERIZATION    . CESAREAN SECTION    . ENDOMETRIAL ABLATION  2007  . FOOT SURGERY     bilateral bunions  . LUMBAR LAMINECTOMY/DECOMPRESSION MICRODISCECTOMY Bilateral 01/12/2016   Procedure:  Bilateral L4-5 Laminotomy/Foraminotomy;  Surgeon: Newman Pies, MD;  Location: [facility];  Service: Neurosurgery;  Laterality: Bilateral;  Bilateral L4-5 Laminotomy/Foraminotomy  . TONSILLECTOMY  1971   Social History  Substance Use Topics  . Smoking status: Never Smoker  . Smokeless tobacco: Never Used  . Alcohol use No   Family History  Problem Relation Age of Onset  . Heart disease Mother   . Hypertension Mother   . Heart failure Mother     CHF  . Diabetes Mother   . Cancer Mother     CERVICAL CANCER  . Heart disease Father   . Diabetes Father   . Liver disease Father   . Hypertension Father   . Diabetes Sister   . Heart disease Brother   . Heart failure Brother     CHF  . Diabetes Brother   . Cancer Brother 24    THROAT AND NECK  . Cancer Maternal Grandfather    Allergies  Allergen Reactions  . Codeine Anxiety  . Darvon Anxiety  . Demerol [Meperidine] Anxiety  . Hydrocodone Anxiety  . Oxycodone Anxiety  . Tramadol Anxiety  . Victoza [Liraglutide] Nausea Only and Other (See Comments)    And weakness   Current Outpatient Prescriptions on File Prior to Visit  Medication Sig Dispense Refill  . ARIPiprazole (ABILIFY) 5 MG tablet Take 5 mg by mouth at bedtime.    . carvedilol (COREG) 12.5 MG tablet Take 1 tablet (12.5 mg total) by mouth 2 (two) times daily. 180 tablet 3  . cetirizine (ZYRTEC) 10 MG tablet Take 10 mg by mouth daily as needed for allergies.    . clorazepate (TRANXENE) 15 MG tablet Take 15 mg by mouth at bedtime.     . cyclobenzaprine (FLEXERIL) 10 MG tablet Take 1 tablet (10 mg total) by mouth 3 (three) times daily as needed for muscle spasms. (Patient taking differently: Take 10 mg by mouth daily after breakfast. ) 50 tablet 1  . docusate sodium (COLACE) 100 MG capsule Take 100 mg by mouth at bedtime.    . DULoxetine (CYMBALTA) 30 MG capsule Take 30 mg by mouth daily.  0  . DULoxetine (CYMBALTA) 60 MG capsule Take 60 mg by mouth daily.   0  .  Multiple Vitamins-Minerals (AIRBORNE) CHEW Chew 3 tablets by mouth daily.    . naproxen sodium (ANAPROX) 220 MG tablet Take 440 mg by mouth 2 (two) times daily with a meal.    . pantoprazole (PROTONIX) 40 MG tablet TAKE 1 TABLET BY MOUTH EVERY DAY 30 tablet 5  . simvastatin (ZOCOR) 20 MG tablet Take 1 tablet (20 mg total) by mouth daily at 6 PM. 90 tablet 3  . spironolactone (ALDACTONE) 25 MG tablet Take 1 tablet (25 mg total) by mouth 2 (two) times daily. 180 tablet 3  . topiramate (TOPAMAX) 100 MG tablet Take 100 mg by mouth daily.   1   No current facility-administered medications on file prior to visit.     Review of Systems    Review of Systems  Constitutional: Negative for fever, appetite change, fatigue and unexpected weight change.  Eyes: Negative for pain and visual disturbance.  Respiratory: Negative for cough and shortness of breath.  pos for R sided cw pain and tenderness  Cardiovascular: Negative for cp or palpitations    Gastrointestinal: Negative for nausea, diarrhea and constipation.  Genitourinary: Negative for urgency and frequency.  Skin: Negative for pallor or rash   Neurological: Negative for weakness, light-headedness, numbness and headaches. (no dizziness now)  Hematological: Negative for adenopathy. Does not bruise/bleed easily.  Psychiatric/Behavioral: Negative for dysphoric mood. The patient is not nervous/anxious.      Objective:   Physical Exam  Constitutional: She appears well-developed and well-nourished. No distress.  HENT:  Head: Normocephalic and atraumatic.  Mouth/Throat: Oropharynx is clear and moist.  Eyes: Conjunctivae and EOM are normal. Pupils are equal, round, and reactive to light. Left eye exhibits no discharge. No scleral icterus.  No nystagums  Neck: Normal range of motion. Neck supple.  Cardiovascular: Normal rate, regular rhythm and normal heart sounds.   Pulmonary/Chest: Effort normal and breath sounds normal. No respiratory distress.  She has no wheezes. She has no rales. She exhibits tenderness.  Anterolateral rib tenderness on R  No crepitus or skin changes  (of note- bruising over ribs on L )  Abdominal: Soft. Bowel sounds are normal. She exhibits no distension. There is no tenderness.  Musculoskeletal: She exhibits no edema.  Lymphadenopathy:    She has no cervical adenopathy.  Neurological: She is alert. She displays no atrophy. No cranial nerve deficit or sensory deficit. She exhibits normal muscle tone. Coordination and gait normal.  No cerebellar signs   Skin: Skin is warm and dry. No rash noted. No erythema.  Bruising over L lower ribs   No skin changes on R  Psychiatric: She has a normal mood and affect.          Assessment & Plan:   Problem List Items Addressed This Visit      Other   Rib pain on right side    After a fall to the floor about a week ago  Suspect contused (less likely fx) rib R anterior  Disc imp of prev atelectasis with deep breaths (holding a pillow0 Disc symptomatic care - see instructions on AVS  Nsaid/heat/ice and conservative/slow movements  If any more falls or dizziness-f.u with pcp (disc fall prev)  Xray of ribs today -and update Re assuring exam  Update if not starting to improve in a week or if worsening  -esp if any inc pain/ sob/cough or fever       Relevant Orders   DG Ribs Unilateral Right (Completed)   Fall at home, initial encounter    With rib inj- pending xray  This happened getting up at night- ? If hypotensive or vertigo-brief  Disc in detail -fall prev  F/u PCP if dizziness re occurs

## 2016-07-06 NOTE — Assessment & Plan Note (Signed)
After a fall to the floor about a week ago  Suspect contused (less likely fx) rib R anterior  Disc imp of prev atelectasis with deep breaths (holding a pillow0 Disc symptomatic care - see instructions on AVS  Nsaid/heat/ice and conservative/slow movements  If any more falls or dizziness-f.u with pcp (disc fall prev)  Xray of ribs today -and update Re assuring exam  Update if not starting to improve in a week or if worsening  -esp if any inc pain/ sob/cough or fever

## 2016-07-07 ENCOUNTER — Encounter (HOSPITAL_COMMUNITY): Payer: Self-pay

## 2016-07-07 ENCOUNTER — Encounter (HOSPITAL_COMMUNITY)
Admission: RE | Admit: 2016-07-07 | Discharge: 2016-07-07 | Disposition: A | Discharge status: Home / Self Care | Payer: BC Managed Care – PPO | Source: Ambulatory Visit | Attending: Neurosurgery | Admitting: Neurosurgery

## 2016-07-07 DIAGNOSIS — M4316 Spondylolisthesis, lumbar region: Secondary | ICD-10-CM | POA: Insufficient documentation

## 2016-07-07 DIAGNOSIS — Z01812 Encounter for preprocedural laboratory examination: Secondary | ICD-10-CM | POA: Insufficient documentation

## 2016-07-07 HISTORY — DX: Prediabetes: R73.03

## 2016-07-07 LAB — BASIC METABOLIC PANEL
Anion gap: 7 (ref 5–15)
BUN: 11 mg/dL (ref 6–20)
CALCIUM: 8.9 mg/dL (ref 8.9–10.3)
CO2: 20 mmol/L — ABNORMAL LOW (ref 22–32)
Chloride: 112 mmol/L — ABNORMAL HIGH (ref 101–111)
Creatinine, Ser: 0.71 mg/dL (ref 0.44–1.00)
GFR calc Af Amer: >60 mL/min (ref >60)
Glucose, Bld: 83 mg/dL (ref 65–99)
POTASSIUM: 3.9 mmol/L (ref 3.5–5.1)
SODIUM: 139 mmol/L (ref 135–145)

## 2016-07-07 LAB — ABO/RH: ABO/RH(D): O POS

## 2016-07-07 LAB — CBC
HEMATOCRIT: 33.8 % — AB (ref 36.0–46.0)
HEMOGLOBIN: 9.9 g/dL — AB (ref 12.0–15.0)
MCH: 22.1 pg — AB (ref 26.0–34.0)
MCHC: 29.3 g/dL — AB (ref 30.0–36.0)
MCV: 75.6 fL — AB (ref 78.0–100.0)
Platelets: 414 10*3/uL — ABNORMAL HIGH (ref 150–400)
RBC: 4.47 MIL/uL (ref 3.87–5.11)
RDW: 18 % — ABNORMAL HIGH (ref 11.5–15.5)
WBC: 11.3 10*3/uL — ABNORMAL HIGH (ref 4.0–10.5)

## 2016-07-07 LAB — SURGICAL PCR SCREEN
MRSA, PCR: NEGATIVE
Staphylococcus aureus: POSITIVE — AB

## 2016-07-07 NOTE — Pre-Procedure Instructions (Addendum)
    Katherine [name]  07/07/2016      [id number] [name], [facility] [place] [address] Phone: [phone] Fax: [phone]    Your procedure is scheduled on Thursday, December 28th   Report to [place] at 9:15 AM             (posted surgery time 12:30 pm - 4:31 pm)             Your arrival time is per your surgeon's request)   Call this number if you have problems the MORNING of surgery:  [phone]   Remember:  Do not eat food or drink liquids after midnight Wednesday.   Take these medicines the morning of surgery with A SIP OF WATER :Protonix, Cymbalta, Coreg.   (stop 7 days prior to surgery aspirin or aspirin products, ibuprofen/ advil/ motrin, naproxen, goody powders, bc's, herbal medicines, multivitamin)   Do not wear jewelry, make-up or nail polish.  Do not wear lotions, powders,  perfumes, or deoderant.   Do not shave underarms & legs 48 hours prior to surgery.   Do not bring valuables to the hospital.  [facility] is not responsible for any belongings or valuables.  Contacts, dentures or bridgework may not be worn into surgery.  Leave your suitcase in the car.  After surgery it may be brought to your room.  For patients admitted to the hospital, discharge time will be determined by your treatment team.  Special instructions:  Preparing for Surgery  Please read over the following fact sheets that you were given. Pain Booklet, MRSA Information and Surgical Site Infection Prevention

## 2016-07-08 NOTE — Progress Notes (Signed)
Anesthesia Chart Review: Patient is a 65 year old female scheduled for posterior lumbar interbody fusion, interbody prosthesis posterior lateral arthrodesis, posterior nonsegmental instrumentation L4-5 on 07/15/2016 by Dr. Arnoldo Morale.  History includes non-smoker, HTN, hypercholesterolemia, GERD, chronic back pain, borderline diabetes, anxiety, migraine headaches, tonsillectomy, endometrial ablation, chest pain with normal coronaries '07, L4-5 laminotomy/foraminotomy 01/12/16. BMI is consistent with obesity.  PCP is Alma Friendly, NP with [facility]. She was seen by Dr. Loura Pardon on 07/05/16 for dizziness (got out of bed and room was spinning) with fall. Rib xray could not exclude a non-displaced 8th rib fracture.  Cardiologist is Dr. Rockey Situ, last visit 01/08/16.  Meds include Abilify, Coreg, Zyrtec, Tranxene, Flexeril, Cymbalta, Protonix, Zocor, Aldactone, Topamax.  BP 131/67   Pulse 99   Temp 36.4 C (Oral)   Resp 20   Wt 207 lb 5 oz (94 kg)   SpO2 99%   BMI 36.72 kg/m   01/08/16 EKG: NSR.  03/20/09 Echo: Study Conclusions Left ventricle: The cavity size was normal. Wall thickness was increased in a pattern of mild LVH. Systolic function was normal. The estimated ejection fraction was 65%. Wall motion was normal; there were no regional wall motion abnormalities.   11/12/05 Cardiac cath: - Left main was normal. -  LAD was a long vessel coursing the apex.  Gave off a moderate-sized  branching diagonal.  Off this diagonal was a very tortuous anomalous vessel  which gave off the right coronary artery.  There is no angiographic CAD. - Left circumflex was a moderate size vessel made up primarily of a large OM1.  There was small OM-2.  There was no angiographic CAD. - Right coronary artery.  This appeared to arise from an anomalous vessel from  the LAD diagonal.  We were unable to cannulate an ostium off the central  aorta.The RCA itself was a moderate size vessel that gave  off a moderate  PDA, a small PL and a moderate-sized PL.  There was no angiographic CAD. - Left ventriculogram done in the RAO position showed an EF of 65% with a  questionable mild mitral valve prolapse.  There was no significant mitral  regurgitation or wall motion abnormalities. ASSESSMENT:  1.  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  There is no angiographic atherosclerotic disease.  2.  Normal LV function with question of mild mitral valve prolapse but no      significant mitral regurgitation.  07/06/16 Right Rib xray: FINDINGS: Limited evaluation due to technique. Nondisplaced right posterolateral eighth rib fracture cannot be excluded. No pneumothorax. IMPRESSION: Limited exam. Nondisplaced right posterolateral rib fracture cannot be excluded. No pneumothorax.  Preoperative labs noted. Cl 112, not changed when compared to 01/09/16 and 03/04/16 labs. Cr 0.71. Glucose 83. WBC 11.3. H/H 9.9/33.8.(previously HGB 11.6-12.9 and HCT 36.2-40.4 since 11/04/14-01/09/16), PLT 414. T&S done. A1c 01/09/16 was 5.8. I left voice message with Lexine Baton at Dr. Adline Mango office regarding H/H results. Also reviewed above with anesthesiologist Dr. Orene Desanctis. With anemia, she will need a T&C as well. I'll recheck H&H at that time.   George Hugh [facility]/Anesthesiology Phone [phone] 07/08/2016 4:37 PM

## 2016-07-15 ENCOUNTER — Inpatient Hospital Stay (HOSPITAL_COMMUNITY): Payer: BC Managed Care – PPO

## 2016-07-15 ENCOUNTER — Encounter (HOSPITAL_COMMUNITY): Payer: Self-pay | Admitting: *Deleted

## 2016-07-15 ENCOUNTER — Inpatient Hospital Stay (HOSPITAL_COMMUNITY): Payer: BC Managed Care – PPO | Admitting: Vascular Surgery

## 2016-07-15 ENCOUNTER — Inpatient Hospital Stay (HOSPITAL_COMMUNITY)
Admission: RE | Admit: 2016-07-15 | Discharge: 2016-07-18 | DRG: 460 | Disposition: A | Discharge status: Home Health | Payer: BC Managed Care – PPO | Source: Ambulatory Visit | Attending: Neurosurgery | Admitting: Neurosurgery

## 2016-07-15 ENCOUNTER — Encounter (HOSPITAL_COMMUNITY)
Admission: RE | Disposition: A | Discharge status: Home Health | Payer: Self-pay | Source: Ambulatory Visit | Attending: Neurosurgery

## 2016-07-15 DIAGNOSIS — M545 Low back pain: Secondary | ICD-10-CM | POA: Diagnosis present

## 2016-07-15 DIAGNOSIS — K219 Gastro-esophageal reflux disease without esophagitis: Secondary | ICD-10-CM | POA: Diagnosis present

## 2016-07-15 DIAGNOSIS — R7303 Prediabetes: Secondary | ICD-10-CM | POA: Diagnosis present

## 2016-07-15 DIAGNOSIS — Z79899 Other long term (current) drug therapy: Secondary | ICD-10-CM

## 2016-07-15 DIAGNOSIS — M4316 Spondylolisthesis, lumbar region: Secondary | ICD-10-CM | POA: Diagnosis present

## 2016-07-15 DIAGNOSIS — E669 Obesity, unspecified: Secondary | ICD-10-CM | POA: Diagnosis present

## 2016-07-15 DIAGNOSIS — Z6836 Body mass index (BMI) 36.0-36.9, adult: Secondary | ICD-10-CM

## 2016-07-15 DIAGNOSIS — E7800 Pure hypercholesterolemia, unspecified: Secondary | ICD-10-CM | POA: Diagnosis present

## 2016-07-15 DIAGNOSIS — F329 Major depressive disorder, single episode, unspecified: Secondary | ICD-10-CM | POA: Diagnosis present

## 2016-07-15 DIAGNOSIS — M5116 Intervertebral disc disorders with radiculopathy, lumbar region: Principal | ICD-10-CM | POA: Diagnosis present

## 2016-07-15 DIAGNOSIS — Z419 Encounter for procedure for purposes other than remedying health state, unspecified: Secondary | ICD-10-CM

## 2016-07-15 DIAGNOSIS — F419 Anxiety disorder, unspecified: Secondary | ICD-10-CM | POA: Diagnosis present

## 2016-07-15 DIAGNOSIS — I10 Essential (primary) hypertension: Secondary | ICD-10-CM | POA: Diagnosis present

## 2016-07-15 DIAGNOSIS — M48062 Spinal stenosis, lumbar region with neurogenic claudication: Secondary | ICD-10-CM | POA: Diagnosis present

## 2016-07-15 LAB — HEMOGLOBIN AND HEMATOCRIT, BLOOD
HCT: 35.3 % — ABNORMAL LOW (ref 36.0–46.0)
Hemoglobin: 10.4 g/dL — ABNORMAL LOW (ref 12.0–15.0)

## 2016-07-15 LAB — PREPARE RBC (CROSSMATCH)

## 2016-07-15 SURGERY — POSTERIOR LUMBAR FUSION 1 LEVEL
Anesthesia: General

## 2016-07-15 MED ORDER — PROPOFOL 10 MG/ML IV BOLUS
INTRAVENOUS | Status: AC
  Filled 2016-07-15: qty 20

## 2016-07-15 MED ORDER — CHLORHEXIDINE GLUCONATE CLOTH 2 % EX PADS: 6.0000 | MEDICATED_PAD | Freq: Once | CUTANEOUS | Status: DC

## 2016-07-15 MED ORDER — THROMBIN 20000 UNITS EX SOLR
CUTANEOUS | Status: AC
  Filled 2016-07-15: qty 20000

## 2016-07-15 MED ORDER — HYDROMORPHONE HCL 1 MG/ML IJ SOLN
INTRAMUSCULAR | Status: AC
  Filled 2016-07-15: qty 0.5

## 2016-07-15 MED ORDER — DULOXETINE HCL 30 MG PO CPEP
30.0000 mg | ORAL_CAPSULE | Freq: Every day | ORAL | Status: DC
Start: 2016-07-16 — End: 2016-07-18
  Administered 2016-07-16 – 2016-07-18 (×3): 30 mg via ORAL
  Filled 2016-07-15 (×3): qty 1

## 2016-07-15 MED ORDER — CLORAZEPATE DIPOTASSIUM 3.75 MG PO TABS
15.0000 mg | ORAL_TABLET | Freq: Every day | ORAL | Status: DC
Start: 2016-07-15 — End: 2016-07-18
  Filled 2016-07-15 (×2): qty 4

## 2016-07-15 MED ORDER — 0.9 % SODIUM CHLORIDE (POUR BTL) OPTIME
TOPICAL | Status: DC | PRN
  Administered 2016-07-15: 1000 mL

## 2016-07-15 MED ORDER — THROMBIN 5000 UNITS EX SOLR
OROMUCOSAL | Status: DC | PRN
  Administered 2016-07-15: 15:00:00 via TOPICAL

## 2016-07-15 MED ORDER — BISACODYL 10 MG RE SUPP: 10.0000 mg | Freq: Every day | RECTAL | Status: DC | PRN

## 2016-07-15 MED ORDER — PHENYLEPHRINE HCL 10 MG/ML IJ SOLN
INTRAMUSCULAR | Status: DC | PRN
  Administered 2016-07-15: 80 ug via INTRAVENOUS

## 2016-07-15 MED ORDER — ACETAMINOPHEN 325 MG PO TABS
650.0000 mg | ORAL_TABLET | ORAL | Status: DC | PRN
  Administered 2016-07-17 – 2016-07-18 (×4): 650 mg via ORAL
  Filled 2016-07-15 (×5): qty 2

## 2016-07-15 MED ORDER — MIDAZOLAM HCL 2 MG/2ML IJ SOLN
INTRAMUSCULAR | Status: AC
  Filled 2016-07-15: qty 2

## 2016-07-15 MED ORDER — LACTATED RINGERS IV SOLN
INTRAVENOUS | Status: DC
  Administered 2016-07-15: 20:00:00 via INTRAVENOUS

## 2016-07-15 MED ORDER — LORATADINE 10 MG PO TABS
10.0000 mg | ORAL_TABLET | Freq: Every day | ORAL | Status: DC
  Administered 2016-07-16 – 2016-07-18 (×3): 10 mg via ORAL
  Filled 2016-07-15 (×4): qty 1

## 2016-07-15 MED ORDER — MENTHOL 3 MG MT LOZG: 1.0000 | LOZENGE | OROMUCOSAL | Status: DC | PRN

## 2016-07-15 MED ORDER — ARIPIPRAZOLE 10 MG PO TABS
5.0000 mg | ORAL_TABLET | Freq: Every day | ORAL | Status: DC
  Administered 2016-07-15 – 2016-07-17 (×3): 5 mg via ORAL
  Filled 2016-07-15 (×4): qty 1

## 2016-07-15 MED ORDER — BUPIVACAINE HCL (PF) 0.5 % IJ SOLN
INTRAMUSCULAR | Status: DC | PRN
  Administered 2016-07-15: 5 mL

## 2016-07-15 MED ORDER — CARVEDILOL 12.5 MG PO TABS
12.5000 mg | ORAL_TABLET | Freq: Two times a day (BID) | ORAL | Status: DC
  Administered 2016-07-15 – 2016-07-18 (×6): 12.5 mg via ORAL
  Filled 2016-07-15 (×3): qty 2
  Filled 2016-07-15: qty 1
  Filled 2016-07-15: qty 2
  Filled 2016-07-15: qty 1

## 2016-07-15 MED ORDER — CYCLOBENZAPRINE HCL 10 MG PO TABS
10.0000 mg | ORAL_TABLET | Freq: Three times a day (TID) | ORAL | Status: DC | PRN
  Administered 2016-07-15 – 2016-07-18 (×4): 10 mg via ORAL
  Filled 2016-07-15 (×4): qty 1

## 2016-07-15 MED ORDER — FENTANYL CITRATE (PF) 100 MCG/2ML IJ SOLN
INTRAMUSCULAR | Status: AC
  Filled 2016-07-15: qty 4

## 2016-07-15 MED ORDER — FENTANYL CITRATE (PF) 100 MCG/2ML IJ SOLN
INTRAMUSCULAR | Status: AC
  Filled 2016-07-15: qty 2

## 2016-07-15 MED ORDER — DIAZEPAM 5 MG PO TABS
5.0000 mg | ORAL_TABLET | Freq: Four times a day (QID) | ORAL | Status: DC | PRN
Start: 2016-07-15 — End: 2016-07-18

## 2016-07-15 MED ORDER — SIMVASTATIN 20 MG PO TABS
20.0000 mg | ORAL_TABLET | Freq: Every day | ORAL | Status: DC
  Administered 2016-07-16 – 2016-07-17 (×2): 20 mg via ORAL
  Filled 2016-07-15 (×2): qty 1

## 2016-07-15 MED ORDER — VANCOMYCIN HCL 1000 MG IV SOLR
INTRAVENOUS | Status: DC | PRN
  Administered 2016-07-15: 1000 mg via TOPICAL

## 2016-07-15 MED ORDER — SPIRONOLACTONE 25 MG PO TABS
25.0000 mg | ORAL_TABLET | Freq: Two times a day (BID) | ORAL | Status: DC
  Administered 2016-07-15 – 2016-07-18 (×6): 25 mg via ORAL
  Filled 2016-07-15 (×7): qty 1

## 2016-07-15 MED ORDER — ARTIFICIAL TEARS OP OINT
TOPICAL_OINTMENT | OPHTHALMIC | Status: DC | PRN
  Administered 2016-07-15: 1 via OPHTHALMIC

## 2016-07-15 MED ORDER — VANCOMYCIN HCL 1000 MG IV SOLR
INTRAVENOUS | Status: AC
  Filled 2016-07-15: qty 1000

## 2016-07-15 MED ORDER — SURGIFOAM 100 EX MISC
CUTANEOUS | Status: DC | PRN
  Administered 2016-07-15: 15:00:00 via TOPICAL

## 2016-07-15 MED ORDER — CEFAZOLIN SODIUM-DEXTROSE 2-4 GM/100ML-% IV SOLN
2.0000 g | Freq: Three times a day (TID) | INTRAVENOUS | Status: AC
  Administered 2016-07-15 – 2016-07-16 (×2): 2 g via INTRAVENOUS
  Filled 2016-07-15 (×2): qty 100

## 2016-07-15 MED ORDER — PHENOL 1.4 % MT LIQD: 1.0000 | OROMUCOSAL | Status: DC | PRN

## 2016-07-15 MED ORDER — BACITRACIN ZINC 500 UNIT/GM EX OINT
TOPICAL_OINTMENT | CUTANEOUS | Status: DC | PRN
  Administered 2016-07-15: 1 via TOPICAL

## 2016-07-15 MED ORDER — HYDROMORPHONE HCL 1 MG/ML IJ SOLN
0.2500 mg | INTRAMUSCULAR | Status: DC | PRN
  Administered 2016-07-15 (×2): 0.5 mg via INTRAVENOUS

## 2016-07-15 MED ORDER — TOPIRAMATE 100 MG PO TABS
100.0000 mg | ORAL_TABLET | Freq: Every day | ORAL | Status: DC
  Administered 2016-07-15 – 2016-07-18 (×4): 100 mg via ORAL
  Filled 2016-07-15 (×4): qty 1

## 2016-07-15 MED ORDER — HYDROMORPHONE HCL 2 MG PO TABS
4.0000 mg | ORAL_TABLET | ORAL | Status: DC | PRN
  Administered 2016-07-15 (×2): 2 mg via ORAL
  Administered 2016-07-16 (×3): 4 mg via ORAL
  Filled 2016-07-15 (×5): qty 2

## 2016-07-15 MED ORDER — FENTANYL CITRATE (PF) 100 MCG/2ML IJ SOLN
INTRAMUSCULAR | Status: DC | PRN
  Administered 2016-07-15 (×4): 50 ug via INTRAVENOUS
  Administered 2016-07-15: 150 ug via INTRAVENOUS
  Administered 2016-07-15: 50 ug via INTRAVENOUS

## 2016-07-15 MED ORDER — SODIUM CHLORIDE 0.9 % IR SOLN
Status: DC | PRN
  Administered 2016-07-15: 15:00:00

## 2016-07-15 MED ORDER — THROMBIN 5000 UNITS EX SOLR
CUTANEOUS | Status: AC
  Filled 2016-07-15: qty 5000

## 2016-07-15 MED ORDER — LACTATED RINGERS IV SOLN
INTRAVENOUS | Status: DC
  Administered 2016-07-15 (×3): via INTRAVENOUS

## 2016-07-15 MED ORDER — LIDOCAINE-EPINEPHRINE (PF) 2 %-1:200000 IJ SOLN
INTRAMUSCULAR | Status: DC | PRN
  Administered 2016-07-15: 5 mL via INTRADERMAL

## 2016-07-15 MED ORDER — ONDANSETRON HCL 4 MG/2ML IJ SOLN: 4.0000 mg | INTRAMUSCULAR | Status: DC | PRN

## 2016-07-15 MED ORDER — PANTOPRAZOLE SODIUM 40 MG PO TBEC
40.0000 mg | DELAYED_RELEASE_TABLET | Freq: Every day | ORAL | Status: DC
  Administered 2016-07-16 – 2016-07-18 (×3): 40 mg via ORAL
  Filled 2016-07-15 (×4): qty 1

## 2016-07-15 MED ORDER — ROCURONIUM BROMIDE 100 MG/10ML IV SOLN
INTRAVENOUS | Status: DC | PRN
  Administered 2016-07-15: 30 mg via INTRAVENOUS
  Administered 2016-07-15: 50 mg via INTRAVENOUS

## 2016-07-15 MED ORDER — MORPHINE SULFATE (PF) 4 MG/ML IV SOLN: 1.0000 mg | INTRAVENOUS | Status: DC | PRN

## 2016-07-15 MED ORDER — DOCUSATE SODIUM 100 MG PO CAPS
100.0000 mg | ORAL_CAPSULE | Freq: Two times a day (BID) | ORAL | Status: DC
  Administered 2016-07-16 – 2016-07-18 (×5): 100 mg via ORAL
  Filled 2016-07-15 (×6): qty 1

## 2016-07-15 MED ORDER — BUPIVACAINE LIPOSOME 1.3 % IJ SUSP
INTRAMUSCULAR | Status: DC | PRN
  Administered 2016-07-15: 20 mL

## 2016-07-15 MED ORDER — CEFAZOLIN SODIUM-DEXTROSE 2-4 GM/100ML-% IV SOLN
2.0000 g | INTRAVENOUS | Status: AC
  Administered 2016-07-15: 2 g via INTRAVENOUS
  Filled 2016-07-15: qty 100

## 2016-07-15 MED ORDER — ALUM & MAG HYDROXIDE-SIMETH 200-200-20 MG/5ML PO SUSP: 30.0000 mL | Freq: Four times a day (QID) | ORAL | Status: DC | PRN

## 2016-07-15 MED ORDER — PROPOFOL 10 MG/ML IV BOLUS
INTRAVENOUS | Status: DC | PRN
Start: 2016-07-15 — End: 2016-07-15
  Administered 2016-07-15: 120 mg via INTRAVENOUS

## 2016-07-15 MED ORDER — BACITRACIN ZINC 500 UNIT/GM EX OINT
TOPICAL_OINTMENT | CUTANEOUS | Status: AC
  Filled 2016-07-15: qty 28.35

## 2016-07-15 MED ORDER — LIDOCAINE-EPINEPHRINE (PF) 2 %-1:200000 IJ SOLN
INTRAMUSCULAR | Status: AC
  Filled 2016-07-15: qty 20

## 2016-07-15 MED ORDER — HYDROMORPHONE HCL 1 MG/ML IJ SOLN
INTRAMUSCULAR | Status: AC
  Filled 2016-07-15: qty 1

## 2016-07-15 MED ORDER — ONDANSETRON HCL 4 MG/2ML IJ SOLN
INTRAMUSCULAR | Status: DC | PRN
  Administered 2016-07-15: 4 mg via INTRAVENOUS

## 2016-07-15 MED ORDER — ONDANSETRON HCL 4 MG/2ML IJ SOLN: 4.0000 mg | Freq: Once | INTRAMUSCULAR | Status: DC | PRN

## 2016-07-15 MED ORDER — LIDOCAINE HCL (CARDIAC) 20 MG/ML IV SOLN
INTRAVENOUS | Status: DC | PRN
  Administered 2016-07-15: 100 mg via INTRAVENOUS

## 2016-07-15 MED ORDER — ACETAMINOPHEN 650 MG RE SUPP: 650.0000 mg | RECTAL | Status: DC | PRN

## 2016-07-15 MED ORDER — MIDAZOLAM HCL 5 MG/5ML IJ SOLN
INTRAMUSCULAR | Status: DC | PRN
  Administered 2016-07-15: 2 mg via INTRAVENOUS

## 2016-07-15 MED ORDER — DULOXETINE HCL 60 MG PO CPEP
60.0000 mg | ORAL_CAPSULE | Freq: Every day | ORAL | Status: DC
  Administered 2016-07-16 – 2016-07-18 (×3): 60 mg via ORAL
  Filled 2016-07-15: qty 1
  Filled 2016-07-15 (×2): qty 2

## 2016-07-15 MED ORDER — LIDOCAINE 2% (20 MG/ML) 5 ML SYRINGE
INTRAMUSCULAR | Status: AC
  Filled 2016-07-15: qty 5

## 2016-07-15 MED ORDER — BUPIVACAINE LIPOSOME 1.3 % IJ SUSP
20.0000 mL | INTRAMUSCULAR | Status: AC
  Filled 2016-07-15: qty 20

## 2016-07-15 MED ORDER — SUGAMMADEX SODIUM 200 MG/2ML IV SOLN
INTRAVENOUS | Status: DC | PRN
  Administered 2016-07-15: 200 mg via INTRAVENOUS

## 2016-07-15 SURGICAL SUPPLY — 66 items
BAG DECANTER FOR FLEXI CONT (MISCELLANEOUS) ×2 IMPLANT
BENZOIN TINCTURE PRP APPL 2/3 (GAUZE/BANDAGES/DRESSINGS) ×2 IMPLANT
BLADE CLIPPER SURG (BLADE) IMPLANT
BUR MATCHSTICK NEURO 3.0 LAGG (BURR) ×2 IMPLANT
BUR PRECISION FLUTE 6.0 (BURR) ×2 IMPLANT
CANISTER SUCT 3000ML PPV (MISCELLANEOUS) ×2 IMPLANT
CAP REVERE LOCKING (Cap) ×8 IMPLANT
CARTRIDGE OIL MAESTRO DRILL (MISCELLANEOUS) ×1 IMPLANT
CONT SPEC 4OZ CLIKSEAL STRL BL (MISCELLANEOUS) ×2 IMPLANT
COVER BACK TABLE 60X90IN (DRAPES) ×2 IMPLANT
COVER TABLE BACK 60X90 (DRAPES) ×2 IMPLANT
DIFFUSER DRILL AIR PNEUMATIC (MISCELLANEOUS) ×2 IMPLANT
DRAPE C-ARM 42X72 X-RAY (DRAPES) ×4 IMPLANT
DRAPE HALF SHEET 40X57 (DRAPES) ×2 IMPLANT
DRAPE LAPAROTOMY 100X72X124 (DRAPES) ×2 IMPLANT
DRAPE POUCH INSTRU U-SHP 10X18 (DRAPES) ×2 IMPLANT
DRAPE SURG 17X23 STRL (DRAPES) ×8 IMPLANT
ELECT BLADE 4.0 EZ CLEAN MEGAD (MISCELLANEOUS) ×2
ELECT REM PT RETURN 9FT ADLT (ELECTROSURGICAL) ×2
ELECTRODE BLDE 4.0 EZ CLN MEGD (MISCELLANEOUS) ×1 IMPLANT
ELECTRODE REM PT RTRN 9FT ADLT (ELECTROSURGICAL) ×1 IMPLANT
EVACUATOR 1/8 PVC DRAIN (DRAIN) IMPLANT
GAUZE SPONGE 4X4 12PLY STRL (GAUZE/BANDAGES/DRESSINGS) ×2 IMPLANT
GAUZE SPONGE 4X4 16PLY XRAY LF (GAUZE/BANDAGES/DRESSINGS) ×2 IMPLANT
GLOVE BIO SURGEON STRL SZ8 (GLOVE) ×4 IMPLANT
GLOVE BIO SURGEON STRL SZ8.5 (GLOVE) ×4 IMPLANT
GLOVE EXAM NITRILE LRG STRL (GLOVE) IMPLANT
GLOVE EXAM NITRILE XL STR (GLOVE) IMPLANT
GLOVE EXAM NITRILE XS STR PU (GLOVE) IMPLANT
GLOVE INDICATOR 7.0 STRL GRN (GLOVE) ×2 IMPLANT
GOWN STRL REUS W/ TWL LRG LVL3 (GOWN DISPOSABLE) IMPLANT
GOWN STRL REUS W/ TWL XL LVL3 (GOWN DISPOSABLE) ×2 IMPLANT
GOWN STRL REUS W/TWL 2XL LVL3 (GOWN DISPOSABLE) IMPLANT
GOWN STRL REUS W/TWL LRG LVL3 (GOWN DISPOSABLE)
GOWN STRL REUS W/TWL XL LVL3 (GOWN DISPOSABLE) ×2
KIT BASIN OR (CUSTOM PROCEDURE TRAY) ×2 IMPLANT
KIT ROOM TURNOVER OR (KITS) ×2 IMPLANT
NEEDLE HYPO 21X1.5 SAFETY (NEEDLE) ×2 IMPLANT
NEEDLE HYPO 22GX1.5 SAFETY (NEEDLE) ×2 IMPLANT
NS IRRIG 1000ML POUR BTL (IV SOLUTION) ×2 IMPLANT
OIL CARTRIDGE MAESTRO DRILL (MISCELLANEOUS) ×2
PACK LAMINECTOMY NEURO (CUSTOM PROCEDURE TRAY) ×2 IMPLANT
PAD ARMBOARD 7.5X6 YLW CONV (MISCELLANEOUS) ×6 IMPLANT
PATTIES SURGICAL .5 X.5 (GAUZE/BANDAGES/DRESSINGS) ×2 IMPLANT
PATTIES SURGICAL .5 X1 (DISPOSABLE) IMPLANT
PATTIES SURGICAL 1X1 (DISPOSABLE) ×2 IMPLANT
ROD REVERE 6.35 40MM (Rod) ×4 IMPLANT
SCREW 7.5X50MM (Screw) ×8 IMPLANT
SLEEVE SURGEON STRL (DRAPES) ×2 IMPLANT
SPACER ALTERA 10X31 8-12MM-8 (Spacer) ×2 IMPLANT
SPONGE LAP 4X18 X RAY DECT (DISPOSABLE) IMPLANT
SPONGE NEURO XRAY DETECT 1X3 (DISPOSABLE) IMPLANT
SPONGE SURGIFOAM ABS GEL 100 (HEMOSTASIS) ×2 IMPLANT
STRIP BIOACTIVE 20CC 25X100X8 (Miscellaneous) ×2 IMPLANT
STRIP CLOSURE SKIN 1/2X4 (GAUZE/BANDAGES/DRESSINGS) ×2 IMPLANT
SUT BONE WAX W31G (SUTURE) ×2 IMPLANT
SUT VIC AB 1 CT1 18XBRD ANBCTR (SUTURE) ×2 IMPLANT
SUT VIC AB 1 CT1 8-18 (SUTURE) ×2
SUT VIC AB 2-0 CP2 18 (SUTURE) ×4 IMPLANT
SYR 20CC LL (SYRINGE) ×2 IMPLANT
TAPE CLOTH SURG 4X10 WHT LF (GAUZE/BANDAGES/DRESSINGS) ×2 IMPLANT
TOWEL OR 17X24 6PK STRL BLUE (TOWEL DISPOSABLE) ×2 IMPLANT
TOWEL OR 17X26 10 PK STRL BLUE (TOWEL DISPOSABLE) ×2 IMPLANT
TRAY FOLEY CATH SILVER 14FR (SET/KITS/TRAYS/PACK) ×2 IMPLANT
TRAY FOLEY W/METER SILVER 16FR (SET/KITS/TRAYS/PACK) ×2 IMPLANT
WATER STERILE IRR 1000ML POUR (IV SOLUTION) ×2 IMPLANT

## 2016-07-15 NOTE — Anesthesia Preprocedure Evaluation (Signed)
Anesthesia Evaluation  Patient identified by MRN, date of birth, ID band Patient awake    Reviewed: Allergy & Precautions, NPO status   Airway Mallampati: I  TM Distance: >3 FB Neck ROM: Full    Dental   Pulmonary sleep apnea ,    Pulmonary exam normal        Cardiovascular hypertension, Pt. on medications Normal cardiovascular exam     Neuro/Psych Anxiety Depression    GI/Hepatic GERD  Controlled and Medicated,  Endo/Other    Renal/GU      Musculoskeletal   Abdominal   Peds  Hematology   Anesthesia Other Findings   Reproductive/Obstetrics                             Anesthesia Physical Anesthesia Plan  ASA: III  Anesthesia Plan: General   Post-op Pain Management:    Induction: Intravenous  Airway Management Planned: Oral ETT  Additional Equipment:   Intra-op Plan:   Post-operative Plan: Extubation in OR  Informed Consent: I have reviewed the patients History and Physical, chart, labs and discussed the procedure including the risks, benefits and alternatives for the proposed anesthesia with the patient or authorized representative who has indicated his/her understanding and acceptance.     Plan Discussed with: CRNA and Surgeon  Anesthesia Plan Comments:         Anesthesia Quick Evaluation

## 2016-07-15 NOTE — Progress Notes (Signed)
Orthopedic Tech Progress Note Patient Details:  Katherine [name] 1950-11-07 [id number] Patient has brace. Patient ID: Katherine [name], female   DOB: [birth year], 65 y.o.   MRN: [id number]   Braulio Bosch 07/15/2016, 7:15 PM

## 2016-07-15 NOTE — Progress Notes (Signed)
Patient ID: Katherine [name], female   DOB: September 25, 1950, 65 y.o.   MRN: [id number] Subjective:  The patient is somnolent but easily arousable. She looks well.  Objective: Vital signs in last 24 hours: Temp:  [97.6 F (36.4 C)-98.1 F (36.7 C)] 98.1 F (36.7 C) (12/28 1754) Pulse Rate:  [82] 82 (12/28 0934) Resp:  [20] 20 (12/28 0931) BP: (128-170)/(68-84) 128/68 (12/28 1754) SpO2:  [97 %] 97 % (12/28 0931) Weight:  [93.9 kg (207 lb)] 93.9 kg (207 lb) (12/28 0931)  Intake/Output from previous day: No intake/output data recorded. Intake/Output this shift: Total I/O In: 900 [I.V.:900] Out: 290 [Urine:140; Blood:150]  Physical exam the patient is somnolent but arousable. She is moving her lower extremities well.  Lab Results:  Recent Labs  07/15/16 0925  HGB 10.4*  HCT 35.3*   BMET No results for input(s): NA, K, CL, CO2, GLUCOSE, BUN, CREATININE, CALCIUM in the last 72 hours.  Studies/Results: Dg Lumbar Spine 2-3 Views  Result Date: 07/15/2016 CLINICAL DATA:  Portable operative imaging for lumbar spine surgery. EXAM: DG C-ARM 61-120 MIN; LUMBAR SPINE - 2-3 VIEW COMPARISON:  04/20/2016 FINDINGS: The 2 submitted images show placement of bilateral pedicle screws at L4 and L5, as well as a well-centered metallic spacer within the L4-L5 disc space. The pedicle screws are well-seated in well-positioned. IMPRESSION: Well-positioned orthopedic hardware for L4-L5 posterior lumbar spine fusion. Electronically Signed   By: Lajean Manes M.D.   On: 07/15/2016 17:37   Dg Lumbar Spine 1 View  Result Date: 07/15/2016 CLINICAL DATA:  Localization films for L4-5 posterior fusion EXAM: LUMBAR SPINE - 1 VIEW COMPARISON:  MRI lumbar spine of 04/30/2016 FINDINGS: A lateral portable cross-table view of the lumbar spine in the operating room for localization shows a clamp on the spinous process posteriorly of L4 with an instrument directed toward the L4-5 interspace. IMPRESSION: Localization of  L4-5. Electronically Signed   By: Ivar Drape M.D.   On: 07/15/2016 15:54   Dg C-arm 1-60 Min  Result Date: 07/15/2016 CLINICAL DATA:  Portable operative imaging for lumbar spine surgery. EXAM: DG C-ARM 61-120 MIN; LUMBAR SPINE - 2-3 VIEW COMPARISON:  04/20/2016 FINDINGS: The 2 submitted images show placement of bilateral pedicle screws at L4 and L5, as well as a well-centered metallic spacer within the L4-L5 disc space. The pedicle screws are well-seated in well-positioned. IMPRESSION: Well-positioned orthopedic hardware for L4-L5 posterior lumbar spine fusion. Electronically Signed   By: Lajean Manes M.D.   On: 07/15/2016 17:37    Assessment/Plan: The patient is doing well.  LOS: 0 days     Amere Bricco D 07/15/2016, 6:04 PM

## 2016-07-15 NOTE — Transfer of Care (Signed)
Immediate Anesthesia Transfer of Care Note  Patient: Katherine [name]  Procedure(s) Performed: Procedure(s) with comments: POSTERIOR LUMBAR INTERBODY FUSION, INTERBODY PROSTHESIS, POSTERIOR LATERAL ARTHRODESIS, POSTERIOR NON-SEGMENTAL INSTRUMENTATION LUMBAR FOUR- LUMBAR FIVE (N/A) - POSTERIOR LUMBAR INTERBODY FUSION, INTERBODY PROSTHESIS, POSTERIOR LATERAL ARTHRODESIS, POSTERIOR NON-SEGMENTAL INSTRUMENTATION L4-L5  Patient Location: PACU  Anesthesia Type:General  Level of Consciousness: awake, alert  and oriented  Airway & Oxygen Therapy: Patient Spontanous Breathing and Patient connected to nasal cannula oxygen  Post-op Assessment: Report given to RN and Post -op Vital signs reviewed and stable  Post vital signs: Reviewed and stable  Last Vitals:  Vitals:   07/15/16 0954 07/15/16 1754  BP: 131/80 128/68  Pulse:    Resp:    Temp:  36.7 C    Last Pain:  Vitals:   07/15/16 0931  TempSrc: Oral         Complications: No apparent anesthesia complications

## 2016-07-15 NOTE — Anesthesia Procedure Notes (Signed)
Procedure Name: Intubation Date/Time: 07/15/2016 1:44 PM Performed by: Clearnce Sorrel Pre-anesthesia Checklist: Patient identified, Emergency Drugs available, Suction available, Patient being monitored and Timeout performed Patient Re-evaluated:Patient Re-evaluated prior to inductionOxygen Delivery Method: Circle system utilized Preoxygenation: Pre-oxygenation with 100% oxygen Intubation Type: IV induction Ventilation: Mask ventilation without difficulty Laryngoscope Size: Mac and 3 Grade View: Grade I Tube type: Oral Tube size: 7.0 mm Number of attempts: 1 Airway Equipment and Method: Stylet Placement Confirmation: ETT inserted through vocal cords under direct vision,  positive ETCO2 and breath sounds checked- equal and bilateral Secured at: 20 cm Tube secured with: Tape Dental Injury: Teeth and Oropharynx as per pre-operative assessment

## 2016-07-15 NOTE — Anesthesia Postprocedure Evaluation (Signed)
Anesthesia Post Note  Patient: Katherine [name]  Procedure(s) Performed: Procedure(s) (LRB): POSTERIOR LUMBAR INTERBODY FUSION, INTERBODY PROSTHESIS, POSTERIOR LATERAL ARTHRODESIS, POSTERIOR NON-SEGMENTAL INSTRUMENTATION LUMBAR FOUR- LUMBAR FIVE (N/A)  Patient location during evaluation: PACU Anesthesia Type: General Level of consciousness: awake and alert Pain management: pain level controlled Vital Signs Assessment: post-procedure vital signs reviewed and stable Respiratory status: spontaneous breathing, nonlabored ventilation, respiratory function stable and patient connected to nasal cannula oxygen Cardiovascular status: blood pressure returned to baseline and stable Postop Assessment: no signs of nausea or vomiting Anesthetic complications: no       Last Vitals:  Vitals:   07/15/16 0954 07/15/16 1754  BP: 131/80 128/68  Pulse:    Resp:    Temp:  36.7 C    Last Pain:  Vitals:   07/15/16 0931  TempSrc: Oral                 Quadry Kampa DAVID

## 2016-07-15 NOTE — H&P (Signed)
Subjective: The patient is a 65 year old white female on whom I performed a previous lumbar laminectomy. She has had recurrent back, hip and leg pain consistent with neurogenic claudication. She has failed medical management. She was worked up with lumbar x-rays and a lumbar MRI which demonstrated an L4-5 spondylolisthesis and foraminal stenosis. I discussed the various treatment options with the patient. She has decided to proceed with surgery.  Past Medical History:  Diagnosis Date  . Anxiety   . Arthritis   . Borderline diabetes   . 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 Chicken pox    age 10  . Chronic back pain    Compressed discs. [place]  . Depression   . GERD (gastroesophageal reflux disease)   . High cholesterol   . Hypertension   . Hypokalemia   . Migraine headache   . Obesity   . Pre-diabetes    okay now     Past Surgical History:  Procedure Laterality Date  . BLADDER SUSPENSION  2005  . CARDIAC CATHETERIZATION    . CESAREAN SECTION    . ENDOMETRIAL ABLATION  2007  . FOOT SURGERY     bilateral bunions  . LUMBAR LAMINECTOMY/DECOMPRESSION MICRODISCECTOMY Bilateral 01/12/2016   Procedure: Bilateral L4-5 Laminotomy/Foraminotomy;  Surgeon: Newman Pies, MD;  Location: [facility];  Service: Neurosurgery;  Laterality: Bilateral;  Bilateral L4-5 Laminotomy/Foraminotomy  . TONSILLECTOMY  1971    Allergies  Allergen Reactions  . Codeine Anxiety  . Darvon Anxiety  . Demerol [Meperidine] Anxiety  . Hydrocodone Anxiety  . Oxycodone Anxiety  . Tramadol Anxiety  . Victoza [Liraglutide] Nausea Only and Other (See Comments)    And weakness    Social History  Substance Use Topics  . Smoking status: Never Smoker  . Smokeless tobacco: Never Used  . Alcohol use No    Family History  Problem Relation Age of Onset  . Heart disease Mother   .  Hypertension Mother   . Heart failure Mother     CHF  . Diabetes Mother   . Cancer Mother     CERVICAL CANCER  . Heart disease Father   . Diabetes Father   . Liver disease Father   . Hypertension Father   . Diabetes Sister   . Heart disease Brother   . Heart failure Brother     CHF  . Diabetes Brother   . Cancer Brother 78    THROAT AND NECK  . Cancer Maternal Grandfather    Prior to Admission medications   Medication Sig Start Date End Date Taking? Authorizing Provider  ARIPiprazole (ABILIFY) 5 MG tablet Take 5 mg by mouth at bedtime.   Yes Historical Provider, MD  carvedilol (COREG) 12.5 MG tablet Take 1 tablet (12.5 mg total) by mouth 2 (two) times daily. 01/08/16  Yes Minna Merritts, MD  cetirizine (ZYRTEC) 10 MG tablet Take 10 mg by mouth daily as needed for allergies.   Yes Historical Provider, MD  clorazepate (TRANXENE) 15 MG tablet Take 15 mg by mouth at bedtime.    Yes Historical Provider, MD  cyclobenzaprine (FLEXERIL) 10 MG tablet Take 1 tablet (10 mg total) by mouth 3 (three) times daily as needed for muscle spasms. Patient taking differently: Take 10 mg by mouth daily after breakfast.  01/13/16  Yes Newman Pies, MD  docusate sodium (COLACE) 100 MG capsule Take 100 mg  by mouth at bedtime.   Yes Historical Provider, MD  DULoxetine (CYMBALTA) 30 MG capsule Take 30 mg by mouth daily. 12/04/15  Yes Historical Provider, MD  DULoxetine (CYMBALTA) 60 MG capsule Take 60 mg by mouth daily.  08/30/14  Yes Historical Provider, MD  Multiple Vitamins-Minerals (AIRBORNE) CHEW Chew 3 tablets by mouth daily.   Yes Historical Provider, MD  naproxen sodium (ANAPROX) 220 MG tablet Take 440 mg by mouth 2 (two) times daily with a meal.   Yes Historical Provider, MD  pantoprazole (PROTONIX) 40 MG tablet TAKE 1 TABLET BY MOUTH EVERY DAY 04/01/16  Yes [name], NP  simvastatin (ZOCOR) 20 MG tablet Take 1 tablet (20 mg total) by mouth daily at 6 PM. 01/08/16  Yes Minna Merritts, MD   spironolactone (ALDACTONE) 25 MG tablet Take 1 tablet (25 mg total) by mouth 2 (two) times daily. 05/17/16  Yes Minna Merritts, MD  topiramate (TOPAMAX) 100 MG tablet Take 100 mg by mouth daily.  08/31/14  Yes Historical Provider, MD     Review of Systems  Positive ROS: As above  All other systems have been reviewed and were otherwise negative with the exception of those mentioned in the HPI and as above.  Objective: Vital signs in last 24 hours: Temp:  [97.6 F (36.4 C)] 97.6 F (36.4 C) (12/28 0931) Pulse Rate:  [82] 82 (12/28 0934) Resp:  [20] 20 (12/28 0931) BP: (131-170)/(80-84) 131/80 (12/28 0954) SpO2:  [97 %] 97 % (12/28 0931) Weight:  [93.9 kg (207 lb)] 93.9 kg (207 lb) (12/28 0931)  General Appearance: Alert, cooperative, no distress, Head: Normocephalic, without obvious abnormality, atraumatic Eyes: PERRL, conjunctiva/corneas clear, EOM's intact,    Ears: Normal  Throat: Normal  Neck: Supple, symmetrical, trachea midline, no adenopathy; thyroid: No enlargement/tenderness/nodules; no carotid bruit or JVD Back: Symmetric, no curvature, ROM normal, no CVA tenderness. The patient's lumbar incision is well-healed. Lungs: Clear to auscultation bilaterally, respirations unlabored Heart: Regular rate and rhythm, no murmur, rub or gallop Abdomen: Soft, non-tender,, no masses, no organomegaly Extremities: Extremities normal, atraumatic, no cyanosis or edema Pulses: 2+ and symmetric all extremities Skin: Skin color, texture, turgor normal, no rashes or lesions  NEUROLOGIC:   Mental status: alert and oriented, no aphasia, good attention span, Fund of knowledge/ memory ok Motor Exam - grossly normal Sensory Exam - grossly normal Reflexes:  Coordination - grossly normal Gait - grossly normal Balance - grossly normal Cranial Nerves: I: smell Not tested  II: visual acuity  OS: Normal  OD: Normal   II: visual fields Full to confrontation  II: pupils Equal, round, reactive  to light  III,VII: ptosis None  III,IV,VI: extraocular muscles  Full ROM  V: mastication Normal  V: facial light touch sensation  Normal  V,VII: corneal reflex  Present  VII: facial muscle function - upper  Normal  VII: facial muscle function - lower Normal  VIII: hearing Not tested  IX: soft palate elevation  Normal  IX,X: gag reflex Present  XI: trapezius strength  5/5  XI: sternocleidomastoid strength 5/5  XI: neck flexion strength  5/5  XII: tongue strength  Normal    Data Review Lab Results  Component Value Date   WBC 11.3 (H) 07/07/2016   HGB 10.4 (L) 07/15/2016   HCT 35.3 (L) 07/15/2016   MCV 75.6 (L) 07/07/2016   PLT 414 (H) 07/07/2016   Lab Results  Component Value Date   NA 139 07/07/2016   K 3.9 07/07/2016  CL 112 (H) 07/07/2016   CO2 20 (L) 07/07/2016   BUN 11 07/07/2016   CREATININE 0.71 07/07/2016   GLUCOSE 83 07/07/2016   No results found for: INR, PROTIME  Assessment/Plan: L4-5 spondylolisthesis, spinal stenosis, lumbago, lumbar radiculopathy: I have discussed the situation with the patient. I have reviewed her imaging studies with her and pointed out the abnormalities. We have discussed the various treatment options clinic surgery. I described the surgical treatment option of an L4-5 decompression, instrumentation, and fusion. I have shown her surgical models. We have discussed the risks, benefits, alternatives, expected postoperative course, and likelihood of achieving her goals with surgery. I have answered all the patient's questions. She has decided to proceed with surgery.    Jamir Rone D 07/15/2016 1:22 PM

## 2016-07-15 NOTE — Op Note (Signed)
Brief history: The patient is a 65 year old white female who has complained of chronic back and leg pain. I performed a previous bilateral L4-5 laminotomy and foraminotomy on her. She initially seemed to be improving but has developed recurrent back, buttock, and leg pain consistent with neurogenic claudication. She has failed medical management was worked up with a lumbar MRI lumbar x-rays. This demonstrated an L4-5 spondylolisthesis, facet arthropathy, foraminal stenosis, etc. I discussed the situation with the patient and her husband. We discussed the various treatment options including surgery. The patient has weighed the risks, benefits, and alternatives to surgery Center proceed with L4-5 decompression, instrumentation, and fusion.  Preoperative diagnosis: L4-5 spondylolisthesis, Degenerative disc disease, spinal stenosis compressing both the L4 and the L5 nerve roots; lumbago; lumbar radiculopathy  Postoperative diagnosis: The same   Procedure: Bilateral redo L4-5 Laminotomy/foraminotomies to decompress the bilateral L4 and L5 nerve roots(the work required to do this was in addition to the work required to do the posterior lumbar interbody fusion because of the patient's spinal stenosis, facet arthropathy. Etc. requiring a wide decompression of the nerve roots.); L4-5 transforaminal lumbar interbody fusion with local morselized autograft bone and Kinnex graft extender; insertion of interbody prosthesis at L4-5 (globus peek expandable interbody prosthesis); posterior nonsegmental instrumentation from L4 to L5 with globus titanium pedicle screws and rods; posterior lateral arthrodesis at L4-5 with local morselized autograft bone and Kinnex bone graft extender.  Surgeon: Dr. Earle Gell  Asst.: Dr. Cyndy Freeze  Anesthesia: Gen. endotracheal  Estimated blood loss: 250 mL  Drains: None  Complications: None  Description of procedure: The patient was brought to the operating room by the anesthesia  team. General endotracheal anesthesia was induced. The patient was turned to the prone position on the Wilson frame. The patient's lumbosacral region was then prepared with Betadine scrub and Betadine solution. Sterile drapes were applied.  I then injected the area to be incised with Marcaine with epinephrine solution. I then used the scalpel to make a linear midline incision over the L4-5 interspace, incising through the old surgical scar. I then used electrocautery to perform a bilateral subperiosteal dissection exposing the spinous process and lamina of L4 and L5. We then obtained intraoperative radiograph to confirm our location. We then inserted the Verstrac retractor to provide exposure.  I began the decompression by using the high speed drill to perform laminotomies at L4-5 bilaterally. We then used the Kerrison punches to widen the laminotomy and removed the epidural scar tissue at L4-5 bilaterally. We used the Kerrison punches to remove the medial facets at L4-5 bilaterally. We performed wide foraminotomies about the bilateral L4 and L5 nerve roots completing the decompression.  We now turned our attention to the posterior lumbar interbody fusion. I used a scalpel to incise the intervertebral disc at L4-5 bilaterally. I then performed a partial intervertebral discectomy at L4-5 bilaterally using the pituitary forceps. We prepared the vertebral endplates at 075-GRM bilaterally for the fusion by removing the soft tissues with the curettes. We then used the trial spacers to pick the appropriate sized interbody prosthesis. We prefilled his prosthesis with a combination of local morselized autograft bone that we obtained during the decompression as well as Kinnex bone graft extender. We inserted the prefilled prosthesis into the interspace at L4-5, we then expanded the prosthesis. There was a good snug fit of the prosthesis in the interspace. We then filled and the remainder of the intervertebral disc space  with local morselized autograft bone and Kinnex. This completed the posterior  lumbar interbody arthrodesis.  We now turned attention to the instrumentation. Under fluoroscopic guidance we cannulated the bilateral L4 and L5 pedicles with the bone probe. We then removed the bone probe. We then tapped the pedicle with a 6.5 millimeter tap. We then removed the tap. We probed inside the tapped pedicle with a ball probe to rule out cortical breaches. We then inserted a 7.5 x 50 millimeter pedicle screw into the L4 and L5 pedicles bilaterally under fluoroscopic guidance. We then palpated along the medial aspect of the pedicles to rule out cortical breaches. There were none. The nerve roots were not injured. We then connected the unilateral pedicle screws with a lordotic rod. We compressed the construct and secured the rod in place with the caps. We then tightened the caps appropriately. This completed the instrumentation from L4-5.  We now turned our attention to the posterior lateral arthrodesis at L4-5. We used the high-speed drill to decorticate the remainder of the facets, pars, transverse process at L4-5 bilaterally. We then applied a combination of local morselized autograft bone and Kinnex bone graft extender over these decorticated posterior lateral structures. This completed the posterior lateral arthrodesis.  We then obtained hemostasis using bipolar electrocautery. We irrigated the wound out with bacitracin solution. We inspected the thecal sac and nerve roots and noted they were well decompressed. We then removed the retractor. We placed vancomycin powder in the wound. We reapproximated patient's thoracolumbar fascia with interrupted #1 Vicryl suture. We reapproximated patient's subcutaneous tissue with interrupted 2-0 Vicryl suture. The reapproximated patient's skin with Steri-Strips and benzoin. The wound was then coated with bacitracin ointment. A sterile dressing was applied. The drapes were removed.  The patient was subsequently returned to the supine position where they were extubated by the anesthesia team. He was then transported to the post anesthesia care unit in stable condition. All sponge instrument and needle counts were reportedly correct at the end of this case.

## 2016-07-16 LAB — CBC
HCT: 30.8 % — ABNORMAL LOW (ref 36.0–46.0)
HEMOGLOBIN: 8.9 g/dL — AB (ref 12.0–15.0)
MCH: 21.8 pg — AB (ref 26.0–34.0)
MCHC: 28.9 g/dL — AB (ref 30.0–36.0)
MCV: 75.3 fL — ABNORMAL LOW (ref 78.0–100.0)
Platelets: 374 10*3/uL (ref 150–400)
RBC: 4.09 MIL/uL (ref 3.87–5.11)
RDW: 17.6 % — AB (ref 11.5–15.5)
WBC: 18.9 10*3/uL — ABNORMAL HIGH (ref 4.0–10.5)

## 2016-07-16 LAB — BASIC METABOLIC PANEL
Anion gap: 10 (ref 5–15)
BUN: 8 mg/dL (ref 6–20)
CALCIUM: 8.5 mg/dL — AB (ref 8.9–10.3)
CHLORIDE: 106 mmol/L (ref 101–111)
CO2: 22 mmol/L (ref 22–32)
CREATININE: 0.71 mg/dL (ref 0.44–1.00)
GFR calc non Af Amer: >60 mL/min (ref >60)
Glucose, Bld: 134 mg/dL — ABNORMAL HIGH (ref 65–99)
Potassium: 3.5 mmol/L (ref 3.5–5.1)
SODIUM: 138 mmol/L (ref 135–145)

## 2016-07-16 MED ORDER — HYDROMORPHONE HCL 2 MG PO TABS
2.0000 mg | ORAL_TABLET | ORAL | Status: DC | PRN
  Administered 2016-07-16 – 2016-07-17 (×4): 2 mg via ORAL
  Filled 2016-07-16 (×5): qty 1

## 2016-07-16 MED FILL — Sodium Chloride IV Soln 0.9%: INTRAVENOUS | Qty: 1000 | Status: AC

## 2016-07-16 MED FILL — Heparin Sodium (Porcine) Inj 1000 Unit/ML: INTRAMUSCULAR | Qty: 30 | Status: AC

## 2016-07-16 NOTE — Progress Notes (Signed)
Patient ID: Katherine [name], female   DOB: 1950-12-08, 65 y.o.   MRN: [id number] Subjective:  The patient is somnolent but arousable. Her back is appropriately sore. Her husband is at the bedside.  Objective: Vital signs in last 24 hours: Temp:  [97.6 F (36.4 C)-101.9 F (38.8 C)] 101.9 F (38.8 C) (12/29 0400) Pulse Rate:  [82-117] 117 (12/29 0400) Resp:  [11-20] 20 (12/29 0400) BP: (128-170)/(65-97) 133/65 (12/29 0400) SpO2:  [92 %-100 %] 100 % (12/29 0400) Weight:  [93.9 kg (207 lb)] 93.9 kg (207 lb) (12/28 0931)  Intake/Output from previous day: 12/28 0701 - 12/29 0700 In: 900 [I.V.:900] Out: 690 [Urine:540; Blood:150] Intake/Output this shift: No intake/output data recorded.  Physical exam the patient is somnolent but arousable. She is moving her lower extremities well.  Lab Results:  Recent Labs  07/15/16 0925 07/16/16 0701  WBC  --  18.9*  HGB 10.4* 8.9*  HCT 35.3* 30.8*  PLT  --  374   BMET No results for input(s): NA, K, CL, CO2, GLUCOSE, BUN, CREATININE, CALCIUM in the last 72 hours.  Studies/Results: Dg Lumbar Spine 2-3 Views  Result Date: 07/15/2016 CLINICAL DATA:  Portable operative imaging for lumbar spine surgery. EXAM: DG C-ARM 61-120 MIN; LUMBAR SPINE - 2-3 VIEW COMPARISON:  04/20/2016 FINDINGS: The 2 submitted images show placement of bilateral pedicle screws at L4 and L5, as well as a well-centered metallic spacer within the L4-L5 disc space. The pedicle screws are well-seated in well-positioned. IMPRESSION: Well-positioned orthopedic hardware for L4-L5 posterior lumbar spine fusion. Electronically Signed   By: Lajean Manes M.D.   On: 07/15/2016 17:37   Dg Lumbar Spine 1 View  Result Date: 07/15/2016 CLINICAL DATA:  Localization films for L4-5 posterior fusion EXAM: LUMBAR SPINE - 1 VIEW COMPARISON:  MRI lumbar spine of 04/30/2016 FINDINGS: A lateral portable cross-table view of the lumbar spine in the operating room for localization shows a clamp  on the spinous process posteriorly of L4 with an instrument directed toward the L4-5 interspace. IMPRESSION: Localization of L4-5. Electronically Signed   By: Ivar Drape M.D.   On: 07/15/2016 15:54   Dg C-arm 1-60 Min  Result Date: 07/15/2016 CLINICAL DATA:  Portable operative imaging for lumbar spine surgery. EXAM: DG C-ARM 61-120 MIN; LUMBAR SPINE - 2-3 VIEW COMPARISON:  04/20/2016 FINDINGS: The 2 submitted images show placement of bilateral pedicle screws at L4 and L5, as well as a well-centered metallic spacer within the L4-L5 disc space. The pedicle screws are well-seated in well-positioned. IMPRESSION: Well-positioned orthopedic hardware for L4-L5 posterior lumbar spine fusion. Electronically Signed   By: Lajean Manes M.D.   On: 07/15/2016 17:37    Assessment/Plan: Postop day #1: We will mobilize the patient. She will likely go home over the weekend. I gave her, and her husband, discharge instructions and answered all their questions.  LOS: 1 day     Greer Wainright D 07/16/2016, 8:01 AM

## 2016-07-16 NOTE — Evaluation (Signed)
Physical Therapy Evaluation Patient Details Name: Katherine [name] MRN: [id number] DOB: 28-Jun-1951 Today's Date: 07/16/2016   History of Present Illness  Pt is a 65 y/o female who presents s/p L4-L5 posterior lumbar fusion on 07/15/16.  Clinical Impression  Pt admitted with above diagnosis. Pt currently with functional limitations due to the deficits listed below (see PT Problem List). At the time of PT eval pt was able to perform transfers and ambulation with gross mod assist. Pt lethargic and required increased assist for balance support. Likely will progress quickly as medications are adjusted. Pt will benefit from skilled PT to increase their independence and safety with mobility to allow discharge to the venue listed below.       Follow Up Recommendations Home health PT    Equipment Recommendations  Rolling walker with 5" wheels;3in1 (PT)    Recommendations for Other Services OT consult     Precautions / Restrictions Precautions Precautions: Fall;Back Precaution Booklet Issued: Yes (comment) Precaution Comments: Reviewed handout, and pt was cued for precautions during functional mobility.  Required Braces or Orthoses: Spinal Brace Spinal Brace: Lumbar corset;Applied in sitting position Restrictions Weight Bearing Restrictions: No      Mobility  Bed Mobility Overal bed mobility: Needs Assistance Bed Mobility: Rolling;Sidelying to Sit;Sit to Sidelying Rolling: Min assist Sidelying to sit: Mod assist     Sit to sidelying: Mod assist General bed mobility comments: Assist and increased time for all aspects of bed mobility. VC's for proper log roll technique.   Transfers Overall transfer level: Needs assistance Equipment used: Rolling walker (2 wheeled) Transfers: Sit to/from Stand Sit to Stand: Mod assist;+2 safety/equipment         General transfer comment: Assist to power-up to full stand, and mod A once standing due to bialteral knee buckling. Pt was eventually  able to gain and maintain standing balance with BUE on walker.  Ambulation/Gait Ambulation/Gait assistance: Min assist;Mod assist Ambulation Distance (Feet): 25 Feet Assistive device: Rolling walker (2 wheeled) Gait Pattern/deviations: Step-through pattern;Decreased stride length;Trunk flexed;Narrow base of support Gait velocity: Very slow Gait velocity interpretation: Below normal speed for age/gender General Gait Details: Slow and guarded gait pattern. +2 helpful (husband assisted) due to unsteadiness - likely effects of medication. Pt was only able to ambulate to hall and back to bed before requiring a seated rest break.   Stairs            Wheelchair Mobility    Modified Rankin (Stroke Patients Only)       Balance Overall balance assessment: Needs assistance Sitting-balance support: Feet supported;No upper extremity supported Sitting balance-Leahy Scale: Poor   Postural control: Posterior lean Standing balance support: Bilateral upper extremity supported;During functional activity Standing balance-Leahy Scale: Poor                               Pertinent Vitals/Pain Pain Assessment: Faces Faces Pain Scale: Hurts whole lot Pain Location: Incision site Pain Descriptors / Indicators: Operative site guarding;Grimacing;Sore Pain Intervention(s): Limited activity within patient's tolerance;Monitored during session;Repositioned    Home Living Family/patient expects to be discharged to:: Private residence Living Arrangements: Spouse/significant other Available Help at Discharge: Family;Available 24 hours/day Type of Home: House Home Access: Level entry     Home Layout: One level Home Equipment: Cane - single point;Shower seat - built in      Prior Function Level of Independence: Independent  Hand Dominance        Extremity/Trunk Assessment   Upper Extremity Assessment Upper Extremity Assessment: Defer to OT evaluation     Lower Extremity Assessment Lower Extremity Assessment: Generalized weakness    Cervical / Trunk Assessment Cervical / Trunk Assessment: Other exceptions Cervical / Trunk Exceptions: s/p lumbar surgery  Communication   Communication: No difficulties  Cognition Arousal/Alertness: Lethargic;Suspect due to medications Behavior During Therapy: Flat affect Overall Cognitive Status: Within Functional Limits for tasks assessed                      General Comments      Exercises     Assessment/Plan    PT Assessment Patient needs continued PT services  PT Problem List Decreased activity tolerance;Decreased strength;Decreased range of motion;Decreased balance;Decreased mobility;Decreased knowledge of use of DME;Decreased safety awareness;Decreased knowledge of precautions;Pain          PT Treatment Interventions DME instruction;Gait training;Stair training;Functional mobility training;Therapeutic activities;Therapeutic exercise;Neuromuscular re-education;Patient/family education    PT Goals (Current goals can be found in the Care Plan section)  Acute Rehab PT Goals Patient Stated Goal: Pt did not state goals at this time PT Goal Formulation: With patient/family Time For Goal Achievement: 07/23/16 Potential to Achieve Goals: Good    Frequency Min 5X/week   Barriers to discharge        Co-evaluation               End of Session Equipment Utilized During Treatment: Gait belt;Back brace Activity Tolerance: Patient limited by fatigue;Patient limited by lethargy Patient left: in bed;with call bell/phone within reach;with family/visitor present Nurse Communication: Mobility status         Time: [id number] PT Time Calculation (min) (ACUTE ONLY): 33 min   Charges:   PT Evaluation $PT Eval Moderate Complexity: 1 Procedure PT Treatments $Gait Training: 8-22 mins   PT G Codes:        Thelma Comp 08-09-16, 12:46 PM   Rolinda Roan, PT, DPT Acute  Rehabilitation Services Pager: [phone]

## 2016-07-17 NOTE — Progress Notes (Signed)
Transferred to room 5c20 now, Caryl Pina, RN gave me report via phone few min back. Stable. Min assist WC to bed.

## 2016-07-17 NOTE — Progress Notes (Signed)
Physical Therapy Treatment Patient Details Name: Katherine [name] MRN: [id number] DOB: 1950-08-04 Today's Date: 07/17/2016    History of Present Illness Pt is a 65 y/o female who presents s/p L4-L5 posterior lumbar fusion on 07/15/16.    PT Comments    Patient progressing slowly towards PT goals. Continues to demonstrate weakness in BLEs requiring Min-Mod A to stand. Fatigues very quickly during mobility. Limited by pain and weakness. Also exhibits some lethargy most likely due to pain medications. Pt needs cues to recall back precautions. Will continue to follow to maximize independence and mobility.  Follow Up Recommendations  Home health PT     Equipment Recommendations  Rolling walker with 5" wheels;3in1 (PT)    Recommendations for Other Services       Precautions / Restrictions Precautions Precautions: Fall;Back Precaution Booklet Issued: Yes (comment) Precaution Comments: Requires cues to recall 3/3 back precautions.  Required Braces or Orthoses: Spinal Brace Spinal Brace: Lumbar corset;Applied in sitting position Restrictions Weight Bearing Restrictions: No    Mobility  Bed Mobility Overal bed mobility: Needs Assistance Bed Mobility: Sit to Sidelying;Rolling Rolling: Min assist       Sit to sidelying: Mod assist General bed mobility comments: Assist to bring LEs into bed to return to supine. Rolling to left with Min A. Cues for proper log roll technique.  Transfers Overall transfer level: Needs assistance Equipment used: Rolling walker (2 wheeled) Transfers: Sit to/from Stand Sit to Stand: Mod assist         General transfer comment: Mod A initially to power to standing with cues for hand placement progressing to Min A ons econd attempt from EOB.   Ambulation/Gait Ambulation/Gait assistance: Min assist Ambulation Distance (Feet): 18 Feet Assistive device: Rolling walker (2 wheeled) Gait Pattern/deviations: Step-through pattern;Decreased stride  length;Narrow base of support;Trunk flexed Gait velocity: Very slow Gait velocity interpretation: Below normal speed for age/gender General Gait Details: Slow, guarded and unsteady gait with bil knee instability noted. Fatigues very quickly.    Stairs            Wheelchair Mobility    Modified Rankin (Stroke Patients Only)       Balance Overall balance assessment: Needs assistance Sitting-balance support: Feet supported;No upper extremity supported Sitting balance-Leahy Scale: Fair     Standing balance support: During functional activity;Bilateral upper extremity supported Standing balance-Leahy Scale: Poor Standing balance comment: Reliant on BUes for support in standing.                    Cognition Arousal/Alertness: Awake/alert Behavior During Therapy: Flat affect Overall Cognitive Status: Within Functional Limits for tasks assessed                      Exercises      General Comments General comments (skin integrity, edema, etc.): Spouse present during session.      Pertinent Vitals/Pain Pain Assessment: Faces Faces Pain Scale: Hurts whole lot Pain Location: Incision site, LEs Pain Descriptors / Indicators: Operative site guarding;Grimacing;Sore Pain Intervention(s): Monitored during session;Repositioned;Limited activity within patient's tolerance    Home Living                      Prior Function            PT Goals (current goals can now be found in the care plan section) Progress towards PT goals: Progressing toward goals (slowly)    Frequency    Min 5X/week  PT Plan Current plan remains appropriate    Co-evaluation             End of Session Equipment Utilized During Treatment: Gait belt;Back brace Activity Tolerance: Patient limited by pain Patient left: in bed;with call bell/phone within reach;with family/visitor present;with SCD's reapplied     Time: [id number] PT Time Calculation (min) (ACUTE  ONLY): 18 min  Charges:  $Gait Training: 8-22 mins                    G Codes:      Chauntay Paszkiewicz A Abeeha Twist 07/17/2016, 9:22 AM  Wray Kearns, [place], DPT [phone]

## 2016-07-17 NOTE — Progress Notes (Signed)
Pt transferred to 5C20 per MD order. Pt is stable @ transfer and report was called to Manuela Schwartz, Therapist, sports. Holli Humbles, RN

## 2016-07-17 NOTE — Progress Notes (Signed)
Patient still complains of intense lumbar pain. Mobilizing very slowly. Sedated from narcotic analgesics. Does not look ready to go home today.  Afebrile. Vitals are stable. Lethargic but arousable. Patient answers questions appropriately. Cranial nerve function intact. Motor and sensory function extremities stable. Wound dressing clean and dry. Chest and abdomen benign.  Progressing slowly following lumbar decompression and fusion. Transfer to floor for additional therapy and convalescence. Possible discharge tomorrow

## 2016-07-18 MED ORDER — ACETAMINOPHEN ER 650 MG PO TBCR: 650.0000 mg | EXTENDED_RELEASE_TABLET | Freq: Three times a day (TID) | ORAL | 0 refills | Status: DC | PRN

## 2016-07-18 NOTE — Discharge Summary (Signed)
Physician Discharge Summary  Patient ID: Katherine [name] MRN: [id number] DOB/AGE: 64-Sep-1952 64 y.o.  Admit date: 07/15/2016 Discharge date: 07/18/2016  Admission Diagnoses:  Lumbar spondylolisthesis  Discharge Diagnoses:  Same Active Problems:   Spondylolisthesis of lumbar region   Discharged Condition: Stable  Hospital Course:  Katherine [name] is a 64 y.o. female electively admitted after L4-5 PLIF. She was somewhat sedated on narcotic pain medication initially postop but was neurologically intact. She improved significantly after narcotics were discontinued and she was ambulating well, tolerating diet, and voiding normally.  Treatments: Surgery - L4-5 PLIF  Discharge Exam: Blood pressure 101/68, pulse 96, temperature 97.9 F (36.6 C), temperature source Axillary, resp. rate 18, height 5\' 3"  (1.6 m), weight 93.9 kg (207 lb), SpO2 96 %. Awake, alert, oriented Speech fluent, appropriate CN grossly intact 5/5 BUE/BLE Wound c/d/i  Disposition: 01-Home or Self Care   Allergies as of 07/18/2016      Reactions   Codeine Anxiety   Darvon Anxiety   Demerol [meperidine] Anxiety   Hydrocodone Anxiety   Oxycodone Anxiety   Tramadol Anxiety   Victoza [liraglutide] Nausea Only, Other (See Comments)   And weakness      Medication List    TAKE these medications   acetaminophen 650 MG CR tablet Commonly known as:  TYLENOL 8 HOUR Take 1 tablet (650 mg total) by mouth every 8 (eight) hours as needed for pain.   AIRBORNE Chew Chew 3 tablets by mouth daily.   ARIPiprazole 5 MG tablet Commonly known as:  ABILIFY Take 5 mg by mouth at bedtime.   carvedilol 12.5 MG tablet Commonly known as:  COREG Take 1 tablet (12.5 mg total) by mouth 2 (two) times daily.   cetirizine 10 MG tablet Commonly known as:  ZYRTEC Take 10 mg by mouth daily as needed for allergies.   clorazepate 15 MG tablet Commonly known as:  TRANXENE Take 15 mg by mouth at bedtime.    cyclobenzaprine 10 MG tablet Commonly known as:  FLEXERIL Take 1 tablet (10 mg total) by mouth 3 (three) times daily as needed for muscle spasms. What changed:  when to take this   docusate sodium 100 MG capsule Commonly known as:  COLACE Take 100 mg by mouth at bedtime.   DULoxetine 60 MG capsule Commonly known as:  CYMBALTA Take 60 mg by mouth daily.   DULoxetine 30 MG capsule Commonly known as:  CYMBALTA Take 30 mg by mouth daily.   naproxen sodium 220 MG tablet Commonly known as:  ANAPROX Take 440 mg by mouth 2 (two) times daily with a meal.   pantoprazole 40 MG tablet Commonly known as:  PROTONIX TAKE 1 TABLET BY MOUTH EVERY DAY   simvastatin 20 MG tablet Commonly known as:  ZOCOR Take 1 tablet (20 mg total) by mouth daily at 6 PM.   spironolactone 25 MG tablet Commonly known as:  ALDACTONE Take 1 tablet (25 mg total) by mouth 2 (two) times daily.   topiramate 100 MG tablet Commonly known as:  TOPAMAX Take 100 mg by mouth daily.            Durable Medical Equipment        Start     Ordered   07/16/16 1030  For home use only DME 3 n 1  Once     07/16/16 1030   07/16/16 1030  For home use only DME Walker rolling  Once    Question:  Patient needs a walker to  treat with the following condition  Answer:  Unsteady gait   07/16/16 1030     Follow-up Information    JENKINS,JEFFREY D, MD Follow up in 3 week(s).   Specialty:  Neurosurgery Contact information: [address] [place] [address] [phone]           Signed: Consuella Lose, Loletha Grayer 07/18/2016, 10:43 AM

## 2016-07-18 NOTE — Progress Notes (Signed)
No issues overnight. Pt and husband report significant improvement since yesterday. Able to walk better with PT today. Appropriate back soreness.  EXAM:  BP 101/68 (BP Location: Right Arm)   Pulse 96   Temp 97.9 F (36.6 C) (Axillary)   Resp 18   Ht 5\' 3"  (1.6 m)   Wt 93.9 kg (207 lb)   SpO2 96%   BMI 36.67 kg/m   Awake, alert, oriented  Speech fluent, appropriate  CN grossly intact  5/5 BUE/BLE  Wound c/d/i  IMPRESSION:  65 y.o. female POD# 3 s/p lumbar fusion, much improved today.  PLAN: - Can d/c home today

## 2016-07-18 NOTE — Care Management Note (Signed)
Case Management Note  Patient Details  Name: TABOR RODINE MRN: [id number] Date of Birth: 10/09/1950  Subjective/Objective:   Lumbar fusion                 Action/Plan: Discharge Planning: AVS reviewed:  [facility] delivered RW and 3n1 to pt prior to dc. Contacted pt via phone. Offered choice for [facility]. Pt agreeable to [facility]. Faxed referral to [facility]. Sent message to [employer].   PCP [name] MD  Expected Discharge Date:  07/18/2016               Expected Discharge Plan:  [place]  In-House Referral:  NA  Discharge planning Services  CM Consult  Post Acute Care Choice:  Home Health Choice offered to:  Patient  DME Arranged:  3-N-1, Walker rolling DME Agency:  [place]:  PT [place] Agency:  [facility] ([facility])  Status of Service:  Completed, signed off  If discussed at [employer] of Stay Meetings, dates discussed:    Additional Comments:  Erenest Rasher, RN 07/18/2016, 5:47 PM

## 2016-07-18 NOTE — Progress Notes (Signed)
Physical Therapy Treatment Patient Details Name: Katherine [name] MRN: [id number] DOB: 04-18-1951 Today's Date: 07/18/2016    History of Present Illness Pt is a 65 y/o female who presents s/p L4-L5 posterior lumbar fusion on 07/15/16.    PT Comments    Pt making progress with PT goals/mobility as she was able to achieve standing from bed and elevated toilet with min guard.  Ambulated 40' with min guard.  Cont's to be limited by pain and fatigue but was steady with gt.  Per husband, pt to be discharged home later today.  This PTA inquired if they had any other questions or concerns- husband denies any questions/concerns and feels they will be fine at home but would like HHPT.      Follow Up Recommendations  Home health PT     Equipment Recommendations  Rolling walker with 5" wheels;3in1 (PT)    Recommendations for Other Services OT consult     Precautions / Restrictions Precautions Precautions: Fall;Back Required Braces or Orthoses: Spinal Brace Spinal Brace: Lumbar corset;Applied in sitting position Restrictions Weight Bearing Restrictions: No    Mobility  Bed Mobility               General bed mobility comments: pt sitting EOB upon arrival.  Recliner at end of session  Transfers Overall transfer level: Needs assistance Equipment used: Rolling walker (2 wheeled) Transfers: Sit to/from Stand Sit to Stand: Min guard         General transfer comment: Cues for hand placement.    Ambulation/Gait Ambulation/Gait assistance: Min guard Ambulation Distance (Feet): 40 Feet Assistive device: Rolling walker (2 wheeled) Gait Pattern/deviations: Step-through pattern;Decreased stride length Gait velocity: decreased   General Gait Details: slow & guarded gt but no LOB noted.  cues to relax shoulders and increase step length.  Instructed pt how to negotiate tight space (bathroom) by sidestepping with RW.     Stairs            Wheelchair Mobility    Modified  Rankin (Stroke Patients Only)       Balance                                    Cognition Arousal/Alertness: Awake/alert Behavior During Therapy: Flat affect Overall Cognitive Status: Impaired/Different from baseline Area of Impairment: Problem solving             Problem Solving: Slow processing;Decreased initiation;Requires verbal cues;Requires tactile cues      Exercises      General Comments        Pertinent Vitals/Pain Pain Assessment: 0-10 Faces Pain Scale: Hurts even more Pain Location: back, hips, legs Pain Descriptors / Indicators: Aching;Grimacing;Guarding Pain Intervention(s): Limited activity within patient's tolerance;Monitored during session;Repositioned;Premedicated before session    Home Living                      Prior Function            PT Goals (current goals can now be found in the care plan section) Acute Rehab PT Goals Patient Stated Goal: "i want to walk again"  PT Goal Formulation: With patient/family Time For Goal Achievement: 07/23/16 Potential to Achieve Goals: Good Progress towards PT goals: Progressing toward goals    Frequency    Min 5X/week      PT Plan Current plan remains appropriate    Co-evaluation  End of Session Equipment Utilized During Treatment: Back brace;Gait belt Activity Tolerance: Patient limited by pain Patient left: in chair;with call bell/phone within reach;with family/visitor present     Time: [id number] PT Time Calculation (min) (ACUTE ONLY): 32 min  Charges:  $Gait Training: 8-22 mins $Therapeutic Activity: 8-22 mins                    G Codes:      Sena Hitch 07/18/2016, 12:17 PM   Sarajane Marek, PTA [phone] 07/18/2016

## 2016-07-18 NOTE — Progress Notes (Signed)
Home now w/mark, husband via wc to car.

## 2016-07-19 DIAGNOSIS — D649 Anemia, unspecified: Secondary | ICD-10-CM

## 2016-07-19 HISTORY — DX: Anemia, unspecified: D64.9

## 2016-07-19 LAB — TYPE AND SCREEN
BLOOD PRODUCT EXPIRATION DATE: 201801122359
Blood Product Expiration Date: 201801122359
ISSUE DATE / TIME: 201712280925
ISSUE DATE / TIME: 201712280925
UNIT TYPE AND RH: 5100
Unit Type and Rh: 5100

## 2016-09-02 ENCOUNTER — Ambulatory Visit
Admission: RE | Admit: 2016-09-02 | Discharge: 2016-09-02 | Disposition: A | Discharge status: Home / Self Care | Payer: BC Managed Care – PPO | Source: Ambulatory Visit | Attending: Primary Care | Admitting: Primary Care

## 2016-09-02 ENCOUNTER — Ambulatory Visit (INDEPENDENT_AMBULATORY_CARE_PROVIDER_SITE_OTHER): Payer: BC Managed Care – PPO | Admitting: Primary Care

## 2016-09-02 ENCOUNTER — Encounter: Payer: Self-pay | Admitting: Primary Care

## 2016-09-02 VITALS — BP 112/70 | HR 97 | Wt 197.0 lb

## 2016-09-02 DIAGNOSIS — M79601 Pain in right arm: Secondary | ICD-10-CM

## 2016-09-02 NOTE — Progress Notes (Signed)
Pre visit review using our clinic review tool, if applicable. No additional management support is needed unless otherwise documented below in the visit note. 

## 2016-09-02 NOTE — Progress Notes (Signed)
Subjective:    Patient ID: Katherine [name], female    DOB: 12-13-1950, 66 y.o.   MRN: [id number]  HPI  Katherine [name] is a 66 year old female who presents today with a chief complaint of extremity pain. Her pain is located to the right upper arm of upper extremity in the humeral region. She fell in early December, before her lumbar surgery, landed onto her right upper extremity and right lateral chest wall. She was evaluated in our office shortly after her fall, underwent xray, and was found to have a possible non displaced rib fracture to the right side.   Since her fall she's noticed continued pain and decrease in ROM in most directions of abduction. She's also noticed weakness. She cannot lay onto her right arm given the discomfort. She denies numbness/tinlging, swelling, re-injury.  Review of Systems  Musculoskeletal:       Right upper extremity pain  Skin: Negative for color change.  Neurological: Positive for weakness. Negative for dizziness and numbness.       Past Medical History:  Diagnosis Date  . Anxiety   . Arthritis   . Borderline diabetes   . 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 Chicken pox    age 43  . Chronic back pain    Compressed discs. [place]  . Depression   . GERD (gastroesophageal reflux disease)   . High cholesterol   . Hypertension   . Hypokalemia   . Migraine headache   . Obesity   . Pre-diabetes    okay now      Social History   Social History  . Marital status: Married    Spouse name: N/A  . Number of children: N/A  . Years of education: N/A   Occupational History  . Teacher [employer]   Social History Main Topics  . Smoking status: Never Smoker  . Smokeless tobacco: Never Used  . Alcohol use No  . Drug use: No  . Sexual activity: Yes    Partners: Male    Birth control/ protection: Post-menopausal     Other Topics Concern  . Not on file   Social History Narrative   Married.   One son is 58, lives in [place].   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   Enjoys substitute teaching, gardening, cooking.    Past Surgical History:  Procedure Laterality Date  . BLADDER SUSPENSION  2005  . CARDIAC CATHETERIZATION    . CESAREAN SECTION    . ENDOMETRIAL ABLATION  2007  . FOOT SURGERY     bilateral bunions  . LUMBAR LAMINECTOMY/DECOMPRESSION MICRODISCECTOMY Bilateral 01/12/2016   Procedure: Bilateral L4-5 Laminotomy/Foraminotomy;  Surgeon: Newman Pies, MD;  Location: [facility];  Service: Neurosurgery;  Laterality: Bilateral;  Bilateral L4-5 Laminotomy/Foraminotomy  . TONSILLECTOMY  1971    Family History  Problem Relation Age of Onset  . Heart disease Mother   . Hypertension Mother   . Heart failure Mother     CHF  . Diabetes Mother   . Cancer Mother     CERVICAL CANCER  . Heart disease Father   . Diabetes Father   . Liver disease Father   . Hypertension Father   . Diabetes Sister   . Heart disease Brother   . Heart failure Brother     CHF  . Diabetes Brother   . Cancer Brother 30  THROAT AND NECK  . Cancer Maternal Grandfather     Allergies  Allergen Reactions  . Codeine Anxiety  . Darvon Anxiety  . Demerol [Meperidine] Anxiety  . Hydrocodone Anxiety  . Oxycodone Anxiety  . Tramadol Anxiety  . Victoza [Liraglutide] Nausea Only and Other (See Comments)    And weakness    Current Outpatient Prescriptions on File Prior to Visit  Medication Sig Dispense Refill  . acetaminophen (TYLENOL 8 HOUR) 650 MG CR tablet Take 1 tablet (650 mg total) by mouth every 8 (eight) hours as needed for pain. 50 tablet 0  . ARIPiprazole (ABILIFY) 5 MG tablet Take 5 mg by mouth at bedtime.    . carvedilol (COREG) 12.5 MG tablet Take 1 tablet (12.5 mg total) by mouth 2 (two) times daily. 180 tablet 3  . cetirizine (ZYRTEC) 10 MG tablet Take 10 mg by mouth daily as needed for allergies.     . clorazepate (TRANXENE) 15 MG tablet Take 15 mg by mouth at bedtime.     . cyclobenzaprine (FLEXERIL) 10 MG tablet Take 1 tablet (10 mg total) by mouth 3 (three) times daily as needed for muscle spasms. (Patient taking differently: Take 10 mg by mouth daily after breakfast. ) 50 tablet 1  . docusate sodium (COLACE) 100 MG capsule Take 100 mg by mouth at bedtime.    . DULoxetine (CYMBALTA) 30 MG capsule Take 30 mg by mouth daily.  0  . DULoxetine (CYMBALTA) 60 MG capsule Take 60 mg by mouth daily.   0  . Multiple Vitamins-Minerals (AIRBORNE) CHEW Chew 3 tablets by mouth daily.    . naproxen sodium (ANAPROX) 220 MG tablet Take 440 mg by mouth 2 (two) times daily with a meal.    . pantoprazole (PROTONIX) 40 MG tablet TAKE 1 TABLET BY MOUTH EVERY DAY 30 tablet 5  . simvastatin (ZOCOR) 20 MG tablet Take 1 tablet (20 mg total) by mouth daily at 6 PM. 90 tablet 3  . spironolactone (ALDACTONE) 25 MG tablet Take 1 tablet (25 mg total) by mouth 2 (two) times daily. 180 tablet 3  . topiramate (TOPAMAX) 100 MG tablet Take 100 mg by mouth daily.   1   No current facility-administered medications on file prior to visit.     BP 112/70 (BP Location: Left Arm, Patient Position: Sitting, Cuff Size: Large)   Pulse 97   Wt 197 lb (89.4 kg)   SpO2 98%   BMI 34.90 kg/m    Objective:   Physical Exam  Constitutional: She appears well-nourished.  Neck: Neck supple.  Cardiovascular: Normal rate and regular rhythm.   Pulmonary/Chest: Effort normal and breath sounds normal.  Musculoskeletal:       Right upper arm: She exhibits tenderness. She exhibits no bony tenderness, no swelling, no edema and no deformity.       Arms: Decrease in ROM with forward abduction, posterior abduction.   Skin: Skin is warm and dry.          Assessment & Plan:  Extremity Pain:  Since fall in December 2017. Exam today without weakness, good strength to bilateral upper extremities. Tenderness to site of concern. No  obvious deformity. Will obtain xray given history of trauma. Discussed PRN ibuprofen, rest, massage. Will await xray results.  Sheral Flow, NP

## 2016-09-02 NOTE — Patient Instructions (Signed)
Complete xray(s) prior to leaving today. I will notify you of your results once received.  Please schedule a physical with me in August 2018. You may also schedule a lab only appointment 3-4 days prior. We will discuss your lab results in detail during your physical.  It was a pleasure to see you today!

## 2016-09-15 ENCOUNTER — Other Ambulatory Visit: Payer: Self-pay | Admitting: Primary Care

## 2016-09-15 ENCOUNTER — Encounter: Payer: Self-pay | Admitting: Family Medicine

## 2016-09-15 ENCOUNTER — Ambulatory Visit (INDEPENDENT_AMBULATORY_CARE_PROVIDER_SITE_OTHER): Payer: BC Managed Care – PPO | Admitting: Family Medicine

## 2016-09-15 VITALS — BP 100/62 | HR 103 | Temp 98.7°F | Ht 62.0 in | Wt 197.2 lb

## 2016-09-15 DIAGNOSIS — M7501 Adhesive capsulitis of right shoulder: Secondary | ICD-10-CM | POA: Diagnosis not present

## 2016-09-15 DIAGNOSIS — K219 Gastro-esophageal reflux disease without esophagitis: Secondary | ICD-10-CM

## 2016-09-15 MED ORDER — METHYLPREDNISOLONE ACETATE 40 MG/ML IJ SUSP
80.0000 mg | Freq: Once | INTRAMUSCULAR | Status: AC
  Administered 2016-09-15: 80 mg via INTRA_ARTICULAR

## 2016-09-15 NOTE — Progress Notes (Signed)
Pre visit review using our clinic review tool, if applicable. No additional management support is needed unless otherwise documented below in the visit note. 

## 2016-09-15 NOTE — Progress Notes (Signed)
Dr. Frederico Hamman T. Makayle Krahn, MD, [facility] Medicine [place], [address] Phone: [phone] Fax: [phone]  09/15/2016  Patient: Katherine [name], MRN: [id number], DOB: [birth year], 66 y.o.  Primary Physician:  Sheral Flow, NP   Chief Complaint  Patient presents with  . Arm Pain    Right-Fell in December   Subjective:   Katherine [name] is a 66 y.o. very pleasant female patient who presents with the following: shoulder pain  The patient noted above presents with shoulder pain that has been ongoing for 3 mo. there is h/o prior traumatic rib fx and spine surgery. The patient denies neck pain or radicular symptoms. No shoulder blade pain Denies dislocation, subluxation, separation of the shoulder. The patient does complain of pain with flexion, abduction, and terminal motion.  Significant restriction of motion. she describes a deep ache around the shoulder, and sometimes it will wake the patient up at night.  R shoulder pain:  [place] in December and all of her weight on her arm. Arm did not start bothering her for a while. Trouble reaching and ROM.   Medications Tried: tylenol, nsaids Ice or Heat: minimal help Tried PT: No  Prior shoulder Injury: No Prior surgery: No Prior fracture: distant clavicle  Past Medical History, Surgical History, Social History, Family History, Medications, and allergies reviewed and updated if relevant.   Patient Active Problem List   Diagnosis Date Noted  . Spondylolisthesis of lumbar region 07/15/2016  . Rib pain on right side 07/06/2016  . Fall at home, initial encounter 07/06/2016  . Vitamin D deficiency 03/11/2016  . Lumbar stenosis with neurogenic claudication 01/12/2016  . Notalgia 01/08/2016  . Restless leg syndrome 03/13/2015  . Preventative health care 12/10/2014  . Other fatigue 11/04/2014  . Anemia 11/04/2014  . Depression 10/31/2014  . Prediabetes 10/31/2014  .  Obstructive sleep apnea 05/01/2009  . Hyperlipidemia 09/04/2008  . Obesity 09/04/2008  . HYPERTENSION, BENIGN 09/04/2008  . CHEST PAIN-UNSPECIFIED 09/04/2008    Past Medical History:  Diagnosis Date  . Anxiety   . Arthritis   . Borderline diabetes   . 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 Chicken pox    age 66  . Chronic back pain    Compressed discs. [place]  . Depression   . GERD (gastroesophageal reflux disease)   . High cholesterol   . Hypertension   . Hypokalemia   . Migraine headache   . Obesity   . Pre-diabetes    okay now     Past Surgical History:  Procedure Laterality Date  . BLADDER SUSPENSION  2005  . CARDIAC CATHETERIZATION    . CESAREAN SECTION    . ENDOMETRIAL ABLATION  2007  . FOOT SURGERY     bilateral bunions  . LUMBAR LAMINECTOMY/DECOMPRESSION MICRODISCECTOMY Bilateral 01/12/2016   Procedure: Bilateral L4-5 Laminotomy/Foraminotomy;  Surgeon: Newman Pies, MD;  Location: [facility];  Service: Neurosurgery;  Laterality: Bilateral;  Bilateral L4-5 Laminotomy/Foraminotomy  . TONSILLECTOMY  1971    Social History   Social History  . Marital status: Married    Spouse name: N/A  . Number of children: N/A  . Years of education: N/A   Occupational History  . Teacher [employer]   Social History Main Topics  . Smoking status: Never Smoker  . Smokeless tobacco: Never Used  .  Alcohol use No  . Drug use: No  . Sexual activity: Yes    Partners: Male    Birth control/ protection: Post-menopausal   Other Topics Concern  . Not on file   Social History Narrative   Married.   One son is 59, lives in [place].   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   Enjoys substitute teaching, gardening, cooking.    Family History  Problem Relation Age of Onset  . Heart disease Mother   . Hypertension Mother   . Heart failure Mother      CHF  . Diabetes Mother   . Cancer Mother     CERVICAL CANCER  . Heart disease Father   . Diabetes Father   . Liver disease Father   . Hypertension Father   . Diabetes Sister   . Heart disease Brother   . Heart failure Brother     CHF  . Diabetes Brother   . Cancer Brother 66    THROAT AND NECK  . Cancer Maternal Grandfather     Allergies  Allergen Reactions  . Codeine Anxiety  . Darvon Anxiety  . Demerol [Meperidine] Anxiety  . Hydrocodone Anxiety  . Oxycodone Anxiety  . Tramadol Anxiety  . Victoza [Liraglutide] Nausea Only and Other (See Comments)    And weakness    Medication list reviewed and updated in full in [facility].  GEN: No fevers, chills. Nontoxic. Primarily MSK c/o today. MSK: Detailed in the HPI GI: tolerating PO intake without difficulty Neuro: No numbness, parasthesias, or tingling associated. Otherwise the pertinent positives of the ROS are noted above.    Objective:   Blood pressure 100/62, pulse (!) 103, temperature 98.7 F (37.1 C), temperature source Oral, height 5\' 2"  (1.575 m), weight 197 lb 4 oz (89.5 kg).   GEN: WDWN, NAD, Non-toxic, Alert & Oriented x 3 HEENT: Atraumatic, Normocephalic.  Ears and Nose: No external deformity. EXTR: No clubbing/cyanosis/edema NEURO: Normal gait.  PSYCH: Normally interactive. Conversant. Not depressed or anxious appearing.  Calm demeanor.   Shoulder: R and L Inspection: No muscle wasting or winging Ecchymosis/edema: neg  AC joint, scapula, clavicle: NT Cervical spine: NT, full ROM Spurling's: neg ABNORMAL SIDE TESTED: R UNLESS OTHERWISE NOTED, THE CONTRALATERAL SIDE HAS FULL RANGE OF MOTION. Abduction: 5/5, LIMITED TO 140 DEGREES Flexion: 5/5, LIMITED TO 160 DEGNO ROM  IR, lift-off: 5/5. TESTED AT 90 DEGREES OF ABDUCTION, LIMITED TO 0 DEGREES ER at neutral:  5/5, TESTED AT 90 DEGREES OF ABDUCTION, LIMITED TO 40 DEGREES AC crossover and compression: PAIN Drop Test: neg Empty Can:  neg Supraspinatus insertion: NT Bicipital groove: NT ALL OTHER SPECIAL TESTING EQUIVOCAL GIVEN LOSS OF MOTION C5-T1 intact Sensation intact Grip 5/5   Dg Humerus Right  Result Date: 09/02/2016 CLINICAL DATA:  Fall 2 months ago with persistent pain, initial encounter EXAM: RIGHT HUMERUS - 2+ VIEW COMPARISON:  None. FINDINGS: There is no evidence of fracture or other focal bone lesions. Soft tissues are unremarkable. IMPRESSION: No acute abnormality noted. Electronically Signed   By: Inez Catalina M.D.   On: 09/02/2016 14:14    Assessment and Plan:   Adhesive capsulitis of right shoulder - Plan: Ambulatory referral to Physical Therapy, methylPREDNISolone acetate (DEPO-MEDROL) injection 80 mg  >25 minutes spent in face to face time with patient, >50% spent in counselling or coordination of care   Patient was given a systematic ROM protocol from [facility] to be done daily. Emphasized importance of adherence, help of PT, daily HEP.  The average length of total symptoms is 12-18 months going through 3 different phases in the freezing and thawing process. Reviewed all with patient.  Suspect this is secondary due to immobility.  Tylenol or NSAID of choice prn for pain relief  Patient will be sent for formal PT for aggressive frozen shoulder ROM. Will need RTC str and scapular stabilization to fix underlying mechanics.  Intrarticular Shoulder Injection, R Verbal consent was obtained from the patient. Risks including infection explained and contrasted with benefits and alternatives. Patient prepped with Chloraprep and Ethyl Chloride used for anesthesia. An intraarticular shoulder injection was performed using the posterior approach. The patient tolerated the procedure well and had decreased pain post injection. No complications. Injection: 8 cc of Lidocaine 1% and 2 mL Depo-Medrol 40 mg. Needle: 22 gauge   Follow-up: 2 mo  Orders Placed This Encounter  Procedures  . Ambulatory referral to  Physical Therapy    Signed,  Frederico Hamman T. Dalaysia Harms, MD   Patient's Medications  New Prescriptions   No medications on file  Previous Medications   ACETAMINOPHEN (TYLENOL 8 HOUR) 650 MG CR TABLET    Take 1 tablet (650 mg total) by mouth every 8 (eight) hours as needed for pain.   ARIPIPRAZOLE (ABILIFY) 2 MG TABLET       CARVEDILOL (COREG) 12.5 MG TABLET    Take 1 tablet (12.5 mg total) by mouth 2 (two) times daily.   CETIRIZINE (ZYRTEC) 10 MG TABLET    Take 10 mg by mouth daily as needed for allergies.   CLORAZEPATE (TRANXENE) 15 MG TABLET    Take 15 mg by mouth at bedtime.    CYCLOBENZAPRINE (FLEXERIL) 10 MG TABLET    Take 1 tablet (10 mg total) by mouth 3 (three) times daily as needed for muscle spasms.   DOCUSATE SODIUM (COLACE) 100 MG CAPSULE    Take 100 mg by mouth at bedtime.   DULOXETINE (CYMBALTA) 30 MG CAPSULE    Take 30 mg by mouth daily.   DULOXETINE (CYMBALTA) 60 MG CAPSULE    Take 60 mg by mouth daily.    MULTIPLE VITAMINS-MINERALS (AIRBORNE) CHEW    Chew 3 tablets by mouth daily.   NAPROXEN SODIUM (ANAPROX) 220 MG TABLET    Take 440 mg by mouth 2 (two) times daily with a meal.   PANTOPRAZOLE (PROTONIX) 40 MG TABLET    TAKE 1 TABLET BY MOUTH EVERY DAY   SIMVASTATIN (ZOCOR) 20 MG TABLET    Take 1 tablet (20 mg total) by mouth daily at 6 PM.   SPIRONOLACTONE (ALDACTONE) 25 MG TABLET    Take 1 tablet (25 mg total) by mouth 2 (two) times daily.   TOPIRAMATE (TOPAMAX) 100 MG TABLET    Take 100 mg by mouth daily.   Modified Medications   No medications on file  Discontinued Medications   ARIPIPRAZOLE (ABILIFY) 5 MG TABLET    Take 5 mg by mouth at bedtime.

## 2016-11-01 ENCOUNTER — Encounter: Payer: Self-pay | Admitting: Family Medicine

## 2016-11-01 ENCOUNTER — Ambulatory Visit (INDEPENDENT_AMBULATORY_CARE_PROVIDER_SITE_OTHER): Payer: Medicare Other | Admitting: Family Medicine

## 2016-11-01 VITALS — BP 118/70 | HR 96 | Temp 97.5°F | Ht 62.0 in | Wt 201.2 lb

## 2016-11-01 DIAGNOSIS — M7501 Adhesive capsulitis of right shoulder: Secondary | ICD-10-CM

## 2016-11-01 MED ORDER — METHYLPREDNISOLONE ACETATE 40 MG/ML IJ SUSP
80.0000 mg | Freq: Once | INTRAMUSCULAR | Status: AC
  Administered 2016-11-01: 80 mg via INTRA_ARTICULAR

## 2016-11-01 NOTE — Progress Notes (Signed)
Dr. Frederico Hamman T. Ilia Dimaano, MD, [facility] and Sports Medicine [place], [address] Phone: [phone] Fax: [phone]  11/01/2016  Patient: Katherine [name], MRN: [id number], DOB: January 06, 1951, 66 y.o.  Primary Physician:  Sheral Flow, NP   Chief Complaint  Patient presents with  . Follow-up    Right Frozen Shoulder-Gotten Worse   Subjective:   Katherine [name] is a 66 y.o. very pleasant female patient who presents with the following: shoulder pain  f/u R frozen shoulder: Arm and shoulder are killing and hurts all the time. Wakes up the night hurting. Onset was approx in January. Dull ache.   ROM has worsened in abd and flexion EROM has improved.  Doing HEP approx 3-4x/week  09/15/2016 Last OV with Owens Loffler, MD  The patient noted above presents with shoulder pain that has been ongoing for 3 mo. there is h/o prior traumatic rib fx and spine surgery. The patient denies neck pain or radicular symptoms. No shoulder blade pain Denies dislocation, subluxation, separation of the shoulder. The patient does complain of pain with flexion, abduction, and terminal motion.  Significant restriction of motion. she describes a deep ache around the shoulder, and sometimes it will wake the patient up at night.  R shoulder pain:  [place] in December and all of her weight on her arm. Arm did not start bothering her for a while. Trouble reaching and ROM.   Medications Tried: tylenol, nsaids Ice or Heat: minimal help Tried PT: No  Prior shoulder Injury: No Prior surgery: No Prior fracture: distant clavicle  Past Medical History, Surgical History, Social History, Family History, Medications, and allergies reviewed and updated if relevant.   Patient Active Problem List   Diagnosis Date Noted  . Spondylolisthesis of lumbar region 07/15/2016  . Rib pain on right side 07/06/2016  . Fall at home, initial encounter 07/06/2016  .  Vitamin D deficiency 03/11/2016  . Lumbar stenosis with neurogenic claudication 01/12/2016  . Notalgia 01/08/2016  . Restless leg syndrome 03/13/2015  . Preventative health care 12/10/2014  . Other fatigue 11/04/2014  . Anemia 11/04/2014  . Depression 10/31/2014  . Prediabetes 10/31/2014  . Obstructive sleep apnea 05/01/2009  . Hyperlipidemia 09/04/2008  . Obesity 09/04/2008  . HYPERTENSION, BENIGN 09/04/2008  . CHEST PAIN-UNSPECIFIED 09/04/2008    Past Medical History:  Diagnosis Date  . Anxiety   . Arthritis   . Borderline diabetes   . 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 Chicken pox    age 66  . Chronic back pain    Compressed discs. [place]  . Depression   . GERD (gastroesophageal reflux disease)   . High cholesterol   . Hypertension   . Hypokalemia   . Migraine headache   . Obesity   . Pre-diabetes    okay now     Past Surgical History:  Procedure Laterality Date  . BLADDER SUSPENSION  2005  . CARDIAC CATHETERIZATION    . CESAREAN SECTION    . ENDOMETRIAL ABLATION  2007  . FOOT SURGERY     bilateral bunions  . LUMBAR LAMINECTOMY/DECOMPRESSION MICRODISCECTOMY Bilateral 01/12/2016   Procedure: Bilateral L4-5 Laminotomy/Foraminotomy;  Surgeon: Newman Pies, MD;  Location: [facility];  Service: Neurosurgery;  Laterality: Bilateral;  Bilateral L4-5 Laminotomy/Foraminotomy  . TONSILLECTOMY  1971    Social History   Social History  .  Marital status: Married    Spouse name: N/A  . Number of children: N/A  . Years of education: N/A   Occupational History  . Teacher [employer]   Social History Main Topics  . Smoking status: Never Smoker  . Smokeless tobacco: Never Used  . Alcohol use No  . Drug use: No  . Sexual activity: Yes    Partners: Male    Birth control/ protection: Post-menopausal   Other Topics Concern  .  Not on file   Social History Narrative   Married.   One son is 19, lives in [place].   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   Enjoys substitute teaching, gardening, cooking.    Family History  Problem Relation Age of Onset  . Heart disease Mother   . Hypertension Mother   . Heart failure Mother     CHF  . Diabetes Mother   . Cancer Mother     CERVICAL CANCER  . Heart disease Father   . Diabetes Father   . Liver disease Father   . Hypertension Father   . Diabetes Sister   . Heart disease Brother   . Heart failure Brother     CHF  . Diabetes Brother   . Cancer Brother 25    THROAT AND NECK  . Cancer Maternal Grandfather     Allergies  Allergen Reactions  . Codeine Anxiety  . Darvon Anxiety  . Demerol [Meperidine] Anxiety  . Hydrocodone Anxiety  . Oxycodone Anxiety  . Tramadol Anxiety  . Victoza [Liraglutide] Nausea Only and Other (See Comments)    And weakness    Medication list reviewed and updated in full in [facility].  GEN: No fevers, chills. Nontoxic. Primarily MSK c/o today. MSK: Detailed in the HPI GI: tolerating PO intake without difficulty Neuro: No numbness, parasthesias, or tingling associated. Otherwise the pertinent positives of the ROS are noted above.    Objective:   Blood pressure 118/70, pulse 96, temperature 97.5 F (36.4 C), temperature source Oral, height 5\' 2"  (1.575 m), weight 201 lb 4 oz (91.3 kg).   GEN: WDWN, NAD, Non-toxic, Alert & Oriented x 3 HEENT: Atraumatic, Normocephalic.  Ears and Nose: No external deformity. EXTR: No clubbing/cyanosis/edema NEURO: Normal gait.  PSYCH: Normally interactive. Conversant. Not depressed or anxious appearing.  Calm demeanor.   Shoulder: R and L Inspection: No muscle wasting or winging Ecchymosis/edema: neg  AC joint, scapula, clavicle: NT Cervical spine: NT, full ROM Spurling's: neg ABNORMAL SIDE TESTED: R UNLESS OTHERWISE NOTED, THE CONTRALATERAL SIDE HAS FULL RANGE OF MOTION. Abduction:  5/5, LIMITED TO 110 DEGREES Flexion: 5/5, LIMITED TO 150 DEGNO ROM  IR, lift-off: 5/5. TESTED AT 90 DEGREES OF ABDUCTION, LIMITED TO 10 DEGREES ER at neutral:  5/5, TESTED AT 90 DEGREES OF ABDUCTION, LIMITED TO 80 DEGREES AC crossover and compression: PAIN Drop Test: neg Empty Can: neg Supraspinatus insertion: NT Bicipital groove: NT ALL OTHER SPECIAL TESTING EQUIVOCAL GIVEN LOSS OF MOTION C5-T1 intact Sensation intact Grip 5/5   No results found.  Assessment and Plan:   Adhesive capsulitis of right shoulder - Plan: methylPREDNISolone acetate (DEPO-MEDROL) injection 80 mg  Worsened abd and flexion, improved EROM. Partially compliant to HEP. Encouraged daily HEP.  Reviewed length of symptoms, frozen shoulder again.  Intrarticular Shoulder Injection, RIGHT Verbal consent was obtained from the patient. Risks including infection explained and contrasted with benefits and alternatives. Patient prepped with Chloraprep and Ethyl Chloride used for anesthesia. An intraarticular shoulder injection was performed using  the posterior approach. The patient tolerated the procedure well and had decreased pain post injection. No complications. Injection: 8 cc of Lidocaine 1% and 2 mL Depo-Medrol 40 mg. Needle: 22 gauge   Follow-up: Return in about 2 months (around 01/01/2017).  Meds ordered this encounter  Medications  . methylPREDNISolone acetate (DEPO-MEDROL) injection 80 mg   Signed,  Xzaiver Vayda T. Adalynne Steffensmeier, MD   Allergies as of 11/01/2016      Reactions   Codeine Anxiety   Darvon Anxiety   Demerol [meperidine] Anxiety   Hydrocodone Anxiety   Oxycodone Anxiety   Tramadol Anxiety   Victoza [liraglutide] Nausea Only, Other (See Comments)   And weakness      Medication List       Accurate as of 11/01/16 10:19 AM. Always use your most recent med list.          acetaminophen 650 MG CR tablet Commonly known as:  TYLENOL 8 HOUR Take 1 tablet (650 mg total) by mouth every 8 (eight)  hours as needed for pain.   AIRBORNE Chew Chew 3 tablets by mouth daily.   ARIPiprazole 2 MG tablet Commonly known as:  ABILIFY   carvedilol 12.5 MG tablet Commonly known as:  COREG Take 1 tablet (12.5 mg total) by mouth 2 (two) times daily.   cetirizine 10 MG tablet Commonly known as:  ZYRTEC Take 10 mg by mouth daily as needed for allergies.   clorazepate 15 MG tablet Commonly known as:  TRANXENE Take 15 mg by mouth at bedtime.   cyclobenzaprine 10 MG tablet Commonly known as:  FLEXERIL Take 1 tablet (10 mg total) by mouth 3 (three) times daily as needed for muscle spasms.   docusate sodium 100 MG capsule Commonly known as:  COLACE Take 100 mg by mouth at bedtime.   DULoxetine 60 MG capsule Commonly known as:  CYMBALTA Take 60 mg by mouth daily.   DULoxetine 30 MG capsule Commonly known as:  CYMBALTA Take 30 mg by mouth daily.   naproxen sodium 220 MG tablet Commonly known as:  ANAPROX Take 440 mg by mouth 2 (two) times daily with a meal.   pantoprazole 40 MG tablet Commonly known as:  PROTONIX TAKE 1 TABLET BY MOUTH EVERY DAY   simvastatin 20 MG tablet Commonly known as:  ZOCOR Take 1 tablet (20 mg total) by mouth daily at 6 PM.   spironolactone 25 MG tablet Commonly known as:  ALDACTONE Take 1 tablet (25 mg total) by mouth 2 (two) times daily.   topiramate 100 MG tablet Commonly known as:  TOPAMAX Take 100 mg by mouth daily.

## 2016-11-01 NOTE — Progress Notes (Signed)
Pre visit review using our clinic review tool, if applicable. No additional management support is needed unless otherwise documented below in the visit note. 

## 2016-11-15 ENCOUNTER — Ambulatory Visit: Payer: BC Managed Care – PPO | Admitting: Family Medicine

## 2016-12-10 ENCOUNTER — Other Ambulatory Visit: Payer: Self-pay | Admitting: Cardiovascular Disease

## 2016-12-17 DIAGNOSIS — M4316 Spondylolisthesis, lumbar region: Secondary | ICD-10-CM | POA: Diagnosis not present

## 2016-12-17 DIAGNOSIS — Z6837 Body mass index (BMI) 37.0-37.9, adult: Secondary | ICD-10-CM | POA: Diagnosis not present

## 2016-12-17 DIAGNOSIS — R030 Elevated blood-pressure reading, without diagnosis of hypertension: Secondary | ICD-10-CM | POA: Diagnosis not present

## 2016-12-17 DIAGNOSIS — M545 Low back pain: Secondary | ICD-10-CM | POA: Diagnosis not present

## 2017-01-03 ENCOUNTER — Ambulatory Visit: Payer: BC Managed Care – PPO | Admitting: Family Medicine

## 2017-01-03 ENCOUNTER — Ambulatory Visit: Payer: BC Managed Care – PPO | Admitting: Primary Care

## 2017-01-03 DIAGNOSIS — M7541 Impingement syndrome of right shoulder: Secondary | ICD-10-CM | POA: Diagnosis not present

## 2017-01-11 ENCOUNTER — Encounter: Payer: Self-pay | Admitting: Primary Care

## 2017-01-11 DIAGNOSIS — R2689 Other abnormalities of gait and mobility: Secondary | ICD-10-CM

## 2017-01-13 DIAGNOSIS — F3132 Bipolar disorder, current episode depressed, moderate: Secondary | ICD-10-CM | POA: Diagnosis not present

## 2017-01-17 DIAGNOSIS — Z79899 Other long term (current) drug therapy: Secondary | ICD-10-CM | POA: Diagnosis not present

## 2017-01-31 DIAGNOSIS — M7541 Impingement syndrome of right shoulder: Secondary | ICD-10-CM | POA: Diagnosis not present

## 2017-02-03 ENCOUNTER — Telehealth: Payer: Self-pay | Admitting: Primary Care

## 2017-02-03 ENCOUNTER — Ambulatory Visit (INDEPENDENT_AMBULATORY_CARE_PROVIDER_SITE_OTHER): Payer: Medicare Other | Admitting: Primary Care

## 2017-02-03 ENCOUNTER — Encounter: Payer: Self-pay | Admitting: Gastroenterology

## 2017-02-03 ENCOUNTER — Other Ambulatory Visit: Payer: Self-pay | Admitting: Primary Care

## 2017-02-03 VITALS — BP 122/74 | HR 96 | Temp 98.5°F | Ht 62.0 in | Wt 218.8 lb

## 2017-02-03 DIAGNOSIS — R5383 Other fatigue: Secondary | ICD-10-CM | POA: Diagnosis not present

## 2017-02-03 DIAGNOSIS — R748 Abnormal levels of other serum enzymes: Secondary | ICD-10-CM

## 2017-02-03 DIAGNOSIS — D649 Anemia, unspecified: Secondary | ICD-10-CM | POA: Diagnosis not present

## 2017-02-03 DIAGNOSIS — D509 Iron deficiency anemia, unspecified: Secondary | ICD-10-CM

## 2017-02-03 LAB — IBC PANEL
Iron: 16 ug/dL — ABNORMAL LOW (ref 42–145)
Saturation Ratios: 2.9 % — ABNORMAL LOW (ref 20.0–50.0)
Transferrin: 401 mg/dL — ABNORMAL HIGH (ref 212.0–360.0)

## 2017-02-03 LAB — CBC
HEMATOCRIT: 30.9 % — AB (ref 36.0–46.0)
MCHC: 28.1 g/dL — ABNORMAL LOW (ref 30.0–36.0)
MCV: 68.2 fl — ABNORMAL LOW (ref 78.0–100.0)
PLATELETS: 511 10*3/uL — AB (ref 150.0–400.0)
RBC: 4.53 Mil/uL (ref 3.87–5.11)
RDW: 19.1 % — ABNORMAL HIGH (ref 11.5–15.5)
WBC: 10.4 10*3/uL (ref 4.0–10.5)

## 2017-02-03 LAB — HEPATIC FUNCTION PANEL
ALK PHOS: 118 U/L — AB (ref 39–117)
ALT: 14 U/L (ref 0–35)
AST: 15 U/L (ref 0–37)
Albumin: 3.7 g/dL (ref 3.5–5.2)
BILIRUBIN DIRECT: 0.1 mg/dL (ref 0.0–0.3)
Total Bilirubin: 0.2 mg/dL (ref 0.2–1.2)
Total Protein: 6.5 g/dL (ref 6.0–8.3)

## 2017-02-03 LAB — TSH: TSH: 2.05 u[IU]/mL (ref 0.35–4.50)

## 2017-02-03 NOTE — Telephone Encounter (Signed)
Lab appointment scheduled 

## 2017-02-03 NOTE — Progress Notes (Signed)
Subjective:    Patient ID: Katherine [name], female    DOB: [birth year], 66 y.o.   MRN: [id number]  HPI  Katherine [name] is a 66 year old female who presents today to discuss lab results that were drawn by her psychiatrist at [facility] [place] on 01/17/17.  Overall she's feeling tired and sluggish. She's not sleeping well so her psychiatrist added a "sleeping pill" with improvement. She is on numerous medications per psychiatry.  1) Anemia: Hemoglobin of 9.2 with MCV of 69, platelets of 465, WBC count of 11.9 with absolute neutrophils of 8.0. She denies vaginal bleeding, rectal bleeding, fevers, cough, chills. She did undergo back surgery several months ago.  2) Elevated Alk Phos: Elevated at 156. She denies abdominal pain, vomiting/nausea. She is managed on numerous medications per psychiatrist. She does have her gall bladder.  Review of Systems  Constitutional: Positive for fatigue. Negative for fever.  HENT: Negative for congestion.   Respiratory: Negative for cough and shortness of breath.   Cardiovascular: Negative for chest pain.  Gastrointestinal: Negative for abdominal pain, nausea and vomiting.  Endocrine: Negative for cold intolerance and polyuria.  Musculoskeletal:       Chronic right shoulder pain, following with ortho. Improved back pain since surgery.   Neurological: Negative for weakness.       Past Medical History:  Diagnosis Date  . Anxiety   . Arthritis   . Borderline diabetes   . 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 Chicken pox    age 31  . Chronic back pain    Compressed discs. [place]  . Depression   . GERD (gastroesophageal reflux disease)   . High cholesterol   . Hypertension   . Hypokalemia   . Migraine headache   . Obesity   . Pre-diabetes    okay now      Social History   Social History  . Marital  status: Married    Spouse name: N/A  . Number of children: N/A  . Years of education: N/A   Occupational History  . Teacher [employer]   Social History Main Topics  . Smoking status: Never Smoker  . Smokeless tobacco: Never Used  . Alcohol use No  . Drug use: No  . Sexual activity: Yes    Partners: Male    Birth control/ protection: Post-menopausal   Other Topics Concern  . Not on file   Social History Narrative   Married.   One son is 73, lives in [place].   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   Enjoys substitute teaching, gardening, cooking.    Past Surgical History:  Procedure Laterality Date  . BLADDER SUSPENSION  2005  . CARDIAC CATHETERIZATION    . CESAREAN SECTION    . ENDOMETRIAL ABLATION  2007  . FOOT SURGERY     bilateral bunions  . LUMBAR LAMINECTOMY/DECOMPRESSION MICRODISCECTOMY Bilateral 01/12/2016   Procedure: Bilateral L4-5 Laminotomy/Foraminotomy;  Surgeon: Newman Pies, MD;  Location: [facility];  Service: Neurosurgery;  Laterality: Bilateral;  Bilateral L4-5 Laminotomy/Foraminotomy  . TONSILLECTOMY  1971    Family History  Problem Relation Age of Onset  . Heart disease Mother   . Hypertension Mother   . Heart failure Mother        CHF  . Diabetes Mother   . Cancer Mother        CERVICAL  CANCER  . Heart disease Father   . Diabetes Father   . Liver disease Father   . Hypertension Father   . Diabetes Sister   . Heart disease Brother   . Heart failure Brother        CHF  . Diabetes Brother   . Cancer Brother 73       THROAT AND NECK  . Cancer Maternal Grandfather     Allergies  Allergen Reactions  . Codeine Anxiety  . Darvon Anxiety  . Demerol [Meperidine] Anxiety  . Hydrocodone Anxiety  . Oxycodone Anxiety  . Tramadol Anxiety  . Victoza [Liraglutide] Nausea Only and Other (See Comments)    And weakness    Current Outpatient Prescriptions on File Prior to Visit  Medication Sig Dispense Refill  . acetaminophen (TYLENOL 8  HOUR) 650 MG CR tablet Take 1 tablet (650 mg total) by mouth every 8 (eight) hours as needed for pain. 50 tablet 0  . carvedilol (COREG) 12.5 MG tablet Take 1 tablet (12.5 mg total) by mouth 2 (two) times daily. 180 tablet 3  . cetirizine (ZYRTEC) 10 MG tablet Take 10 mg by mouth daily as needed for allergies.    . clorazepate (TRANXENE) 15 MG tablet Take 15 mg by mouth at bedtime.     . cyclobenzaprine (FLEXERIL) 10 MG tablet Take 1 tablet (10 mg total) by mouth 3 (three) times daily as needed for muscle spasms. (Patient taking differently: Take 10 mg by mouth daily after breakfast. ) 50 tablet 1  . docusate sodium (COLACE) 100 MG capsule Take 100 mg by mouth at bedtime.    . DULoxetine (CYMBALTA) 30 MG capsule Take 30 mg by mouth daily.  0  . DULoxetine (CYMBALTA) 60 MG capsule Take 60 mg by mouth daily.   0  . Multiple Vitamins-Minerals (AIRBORNE) CHEW Chew 3 tablets by mouth daily.    . naproxen sodium (ANAPROX) 220 MG tablet Take 440 mg by mouth 2 (two) times daily with a meal.    . pantoprazole (PROTONIX) 40 MG tablet TAKE 1 TABLET BY MOUTH EVERY DAY 30 tablet 5  . simvastatin (ZOCOR) 20 MG tablet TAKE 1 TABLET EVERY DAY 90 tablet 3  . spironolactone (ALDACTONE) 25 MG tablet Take 1 tablet (25 mg total) by mouth 2 (two) times daily. 180 tablet 3  . topiramate (TOPAMAX) 100 MG tablet Take 100 mg by mouth daily.   1   No current facility-administered medications on file prior to visit.     BP 122/74   Pulse 96   Temp 98.5 F (36.9 C) (Oral)   Ht 5' 2"  (1.575 m)   Wt 218 lb 12.8 oz (99.2 kg)   SpO2 97%   BMI 40.02 kg/m    Objective:   Physical Exam  Constitutional: She appears well-nourished.  Neck: Neck supple.  Cardiovascular: Normal rate and regular rhythm.   Pulmonary/Chest: Effort normal and breath sounds normal.  Abdominal: Soft. Bowel sounds are normal. There is no tenderness.  Skin: Skin is warm and dry.  Psychiatric: She has a normal mood and affect.            Assessment & Plan:  Abnormal Labs:  Drawn by psychiatrist in early July 2018. Overall labs stable but some abnormal. Will repeat CBC and Hepatic function. Add TSH, IBC Panel. She is asymptomatic and appears well.  Sheral Flow, NP

## 2017-02-03 NOTE — Patient Instructions (Signed)
Complete lab work prior to leaving today. I will notify you of your results once received.   Start calcium with vitamin D. You need 1200 mg of calcium in divided doses. This may come with vitamin D which is fine but you need an extra 1000 units of vitmain D daily. Make sure you purchase the calcium with vitamin D and an extra Vitamin D 1000 units once daily.  It was a pleasure to see you today!

## 2017-02-03 NOTE — Telephone Encounter (Signed)
Spoke with patient regarding labs. Referral to GI placed for evaluation of anemia. She will start oral iron today. Needs lab only appointment in 10 days for repeat CBC.

## 2017-02-04 ENCOUNTER — Other Ambulatory Visit: Payer: Self-pay | Admitting: Cardiovascular Disease

## 2017-02-04 DIAGNOSIS — M7541 Impingement syndrome of right shoulder: Secondary | ICD-10-CM | POA: Diagnosis not present

## 2017-02-08 ENCOUNTER — Ambulatory Visit: Payer: BC Managed Care – PPO | Admitting: Primary Care

## 2017-02-11 ENCOUNTER — Other Ambulatory Visit: Payer: Self-pay | Admitting: Internal Medicine

## 2017-02-11 DIAGNOSIS — Z1231 Encounter for screening mammogram for malignant neoplasm of breast: Secondary | ICD-10-CM

## 2017-02-14 ENCOUNTER — Other Ambulatory Visit: Payer: Self-pay | Admitting: Primary Care

## 2017-02-14 ENCOUNTER — Other Ambulatory Visit (INDEPENDENT_AMBULATORY_CARE_PROVIDER_SITE_OTHER): Payer: Medicare Other

## 2017-02-14 DIAGNOSIS — D509 Iron deficiency anemia, unspecified: Secondary | ICD-10-CM | POA: Diagnosis not present

## 2017-02-14 LAB — CBC
HCT: 30.9 % — ABNORMAL LOW (ref 36.0–46.0)
Hemoglobin: 8.9 g/dL — ABNORMAL LOW (ref 12.0–15.0)
MCHC: 28.7 g/dL — ABNORMAL LOW (ref 30.0–36.0)
PLATELETS: 399 10*3/uL (ref 150.0–400.0)
RBC: 4.5 Mil/uL (ref 3.87–5.11)
RDW: 19.1 % — ABNORMAL HIGH (ref 11.5–15.5)
WBC: 10.4 10*3/uL (ref 4.0–10.5)

## 2017-02-15 ENCOUNTER — Other Ambulatory Visit (INDEPENDENT_AMBULATORY_CARE_PROVIDER_SITE_OTHER): Payer: Medicare Other

## 2017-02-15 ENCOUNTER — Telehealth: Payer: Self-pay | Admitting: Primary Care

## 2017-02-15 ENCOUNTER — Encounter: Payer: Self-pay | Admitting: Gastroenterology

## 2017-02-15 ENCOUNTER — Ambulatory Visit (INDEPENDENT_AMBULATORY_CARE_PROVIDER_SITE_OTHER): Payer: Medicare Other | Admitting: Gastroenterology

## 2017-02-15 VITALS — BP 126/68 | HR 72 | Ht 63.0 in | Wt 218.0 lb

## 2017-02-15 DIAGNOSIS — K5900 Constipation, unspecified: Secondary | ICD-10-CM

## 2017-02-15 DIAGNOSIS — D509 Iron deficiency anemia, unspecified: Secondary | ICD-10-CM

## 2017-02-15 LAB — FERRITIN: Ferritin: 13.3 ng/mL (ref 10.0–291.0)

## 2017-02-15 MED ORDER — NA SULFATE-K SULFATE-MG SULF 17.5-3.13-1.6 GM/177ML PO SOLN: 1.0000 | ORAL | 0 refills | Status: DC

## 2017-02-15 NOTE — Progress Notes (Signed)
Assessment and plans reviewed  

## 2017-02-15 NOTE — Telephone Encounter (Signed)
Pt sent following through mychart appt request:  Dear Katherine [name],    I am anxious to hear what todays blood test results will  Reveal to you. I am feeling really bad in that I have very little energy. Is this due to the anemia?i see the GI Dr tomorrow at [facility] and [name] my Psychiatrist on Thursday. Hoping to hear from you soon.     Katherine [name].   [phone]

## 2017-02-15 NOTE — Telephone Encounter (Signed)
I sent her a My Chart message last night. Will you please check on her?

## 2017-02-15 NOTE — Progress Notes (Signed)
02/15/2017 Katherine [name] [id number] 01-29-1951   HISTORY OF PRESENT ILLNESS:  This is a 66 year old female who is previously known to Dr. Deatra Ina for colonoscopy in January 2009. At that time she is only found have diverticulosis.  She presents to our office today at the request of her PCP, Alma Friendly, NP, for evaluation regarding her anemia. Hemoglobin is currently 8.9 g. Has been at this level since her back surgery at the end of December 2017. Even prior to that it was running low at about the 10 g range. Iron studies show serum iron of 16, saturation 2.9%. She denies seeing any red blood in her stools and her stools have not been checked for occult blood, but she reports that she had about 2 weeks of very dark black stools that occurred about 1 month ago. That resolved and has not been recurrent since that time. She says that she tends towards constipation on a regular basis because of her medications that she takes. Uses stool softener, which she believes helps with her constipation. She also uses naproxen 440 mg twice daily on a regular basis for several years. She is on pantoprazole 40 mg daily. She denies any abdominal pain. Previously had heartburn/reflux, but feels that this is well controlled on her pantoprazole.   Past Medical History:  Diagnosis Date  . Anxiety   . Arthritis   . Borderline diabetes   . 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 Chicken pox    age 58  . Chronic back pain    Compressed discs. [place]  . Depression   . GERD (gastroesophageal reflux disease)   . High cholesterol   . Hypertension   . Hypokalemia   . Migraine headache   . Obesity   . Pre-diabetes    okay now    Past Surgical History:  Procedure Laterality Date  . BLADDER SUSPENSION  2005  . CARDIAC CATHETERIZATION    . CESAREAN SECTION    . ENDOMETRIAL ABLATION   2007  . FOOT SURGERY     bilateral bunions  . LUMBAR LAMINECTOMY/DECOMPRESSION MICRODISCECTOMY Bilateral 01/12/2016   Procedure: Bilateral L4-5 Laminotomy/Foraminotomy;  Surgeon: Newman Pies, MD;  Location: [facility];  Service: Neurosurgery;  Laterality: Bilateral;  Bilateral L4-5 Laminotomy/Foraminotomy  . TONSILLECTOMY  1971    reports that she has never smoked. She has never used smokeless tobacco. She reports that she does not drink alcohol or use drugs. family history includes Cancer in her maternal grandfather and mother; Cancer (age of onset: 32) in her brother; Diabetes in her brother, father, mother, and sister; Heart disease in her brother, father, and mother; Heart failure in her brother and mother; Hypertension in her father and mother; Liver disease in her father. Allergies  Allergen Reactions  . Codeine Anxiety  . Darvon Anxiety  . Demerol [Meperidine] Anxiety  . Hydrocodone Anxiety  . Oxycodone Anxiety  . Tramadol Anxiety  . Victoza [Liraglutide] Nausea Only and Other (See Comments)    And weakness      Outpatient Encounter Prescriptions as of 02/15/2017  Medication Sig  . acetaminophen (TYLENOL 8 HOUR) 650 MG CR tablet Take 1 tablet (650 mg total) by mouth every 8 (eight) hours as needed for pain.  . ARIPiprazole (ABILIFY) 5 MG tablet Take 2.5 mg by mouth daily.  . carvedilol (COREG) 12.5 MG tablet TAKE  1 TABLET (12.5 MG TOTAL) BY MOUTH 2 (TWO) TIMES DAILY.  . cetirizine (ZYRTEC) 10 MG tablet Take 10 mg by mouth daily as needed for allergies.  . clorazepate (TRANXENE) 15 MG tablet Take 15 mg by mouth at bedtime.   . docusate sodium (COLACE) 100 MG capsule Take 100 mg by mouth at bedtime.  Marland Kitchen doxepin (SINEQUAN) 25 MG capsule TAKE ONE CAPSULE BY MOUTH 45MIN BEFORE BED  . DULoxetine (CYMBALTA) 30 MG capsule Take 30 mg by mouth daily.  . DULoxetine (CYMBALTA) 60 MG capsule Take 60 mg by mouth daily.   . Multiple Vitamins-Minerals (AIRBORNE) CHEW Chew 3 tablets by  mouth daily.  . naproxen sodium (ANAPROX) 220 MG tablet Take 440 mg by mouth 2 (two) times daily with a meal.  . pantoprazole (PROTONIX) 40 MG tablet TAKE 1 TABLET BY MOUTH EVERY DAY  . simvastatin (ZOCOR) 20 MG tablet TAKE 1 TABLET EVERY DAY  . spironolactone (ALDACTONE) 25 MG tablet Take 1 tablet (25 mg total) by mouth 2 (two) times daily.  Marland Kitchen topiramate (TOPAMAX) 100 MG tablet Take 100 mg by mouth daily.   . [DISCONTINUED] cyclobenzaprine (FLEXERIL) 10 MG tablet Take 1 tablet (10 mg total) by mouth 3 (three) times daily as needed for muscle spasms. (Patient taking differently: Take 10 mg by mouth daily after breakfast. )   No facility-administered encounter medications on file as of 02/15/2017.      REVIEW OF SYSTEMS  : All other systems reviewed and negative except where noted in the History of Present Illness.   PHYSICAL EXAM: BP 126/68 (BP Location: Right Arm, Patient Position: Sitting, Cuff Size: Large)   Pulse 72   Ht 5\' 3"  (1.6 m)   Wt 218 lb (98.9 kg)   BMI 38.62 kg/m  General: Well developed white female in no acute distress Head: Normocephalic and atraumatic Eyes:  Sclerae anicteric, conjunctiva pink. Ears: Normal auditory acuity Lungs: Clear throughout to auscultation; no increased WOB. Heart: Regular rate and rhythm; no M/R/G. Abdomen: Soft, non-distended.  BS present.  Non-tender. Rectal:  Will be done at the time of colonoscopy. Musculoskeletal: Symmetrical with no gross deformities  Skin: No lesions on visible extremities Extremities: No edema  Neurological: Alert oriented x 4, grossly non-focal Psychological:  Alert and cooperative. Normal mood and affect  ASSESSMENT AND PLAN: *66 year old female with IDA:  Hgb 8.9 grams and has been stable at this level for the past 7 months.  Iron levels low.  No colonoscopy in 9.5 years and never had an EGD.  Takes NSAID's regularly for several years.  No stool hemoccult performed and no overt bleeding noted, but had black  stools for a couple of weeks about one month ago.  Will schedule for EGD and colonoscopy with Dr. Henrene Pastor to rule out NSAID induced ulcer/erosions, AVM's, neoplasm, and other sources of possible GI blood loss that could account for her anemia.  Will give 2 day bowel prep as she tends to be constipated from her medications.  Will set up for IV iron infusions as well since she may have a hard time tolerating adequate doses of by mouth iron.  -The risks, benefits, and alternatives to EGD and colonoscopy were discussed with the patient and she consents to proceed.   CC:  [name], NP

## 2017-02-15 NOTE — Telephone Encounter (Signed)
Spoken and notified patient of Kate's comments. Patient verbalized understanding.  Patient stated that she was told by GI to come to see Anda Kraft regarding SOB. Schedule a follow up on 03/01/2017

## 2017-02-15 NOTE — Patient Instructions (Addendum)
You have been scheduled for an endoscopy and colonoscopy. Please follow the written instructions given to you at your visit today. Please pick up your prep supplies at the pharmacy within the next 1-3 days. If you use inhalers (even only as needed), please bring them with you on the day of your procedure. Your physician has requested that you go to www.startemmi.com and enter the access code given to you at your visit today. This web site gives a general overview about your procedure. However, you should still follow specific instructions given to you by our office regarding your preparation for the procedure.  Your physician has requested that you go to the basement for the following lab work before leaving today: Ferritin  You will be set up for iron infusion.

## 2017-02-17 DIAGNOSIS — F3132 Bipolar disorder, current episode depressed, moderate: Secondary | ICD-10-CM | POA: Diagnosis not present

## 2017-02-18 DIAGNOSIS — G8929 Other chronic pain: Secondary | ICD-10-CM | POA: Diagnosis not present

## 2017-02-18 DIAGNOSIS — M7501 Adhesive capsulitis of right shoulder: Secondary | ICD-10-CM | POA: Diagnosis not present

## 2017-02-18 DIAGNOSIS — M25511 Pain in right shoulder: Secondary | ICD-10-CM | POA: Diagnosis not present

## 2017-02-18 DIAGNOSIS — M7541 Impingement syndrome of right shoulder: Secondary | ICD-10-CM | POA: Diagnosis not present

## 2017-02-21 ENCOUNTER — Telehealth: Payer: Self-pay | Admitting: Gastroenterology

## 2017-02-21 NOTE — Telephone Encounter (Signed)
Desiree, do you know anything about this?

## 2017-02-22 ENCOUNTER — Other Ambulatory Visit: Payer: Self-pay

## 2017-02-22 DIAGNOSIS — D509 Iron deficiency anemia, unspecified: Secondary | ICD-10-CM

## 2017-02-23 ENCOUNTER — Telehealth: Payer: Self-pay

## 2017-02-23 NOTE — Telephone Encounter (Signed)
-----   Message from Fransico Setters, [place] sent at 02/23/2017  8:38 AM EDT ----- Regarding: RE: Roxy Horseman morning Vaughan Basta. Patient has been schedule for Friday, 03/04/17 @ 0900 for 1st dose and  Friday, 03/11/17 @ 1100. I did see the orders are already placed. Thank you. I do need the patient's diagnosis and please make patient aware of appointment dates/times.  Thanks as always,  Karena Addison ----- Message ----- From: Algernon Huxley, RN Sent: 02/22/2017   3:52 PM To: Fransico Setters, NT Subject: Roxy Horseman afternoon,  I need to get this lady scheduled for 2 doses of Feraheme. Please let me know when she can get these infusions.  Thanks, Rosanne Sack RN  PS-Orders are in epic.  ----- Message ----- From: Loralie Champagne, PA-C Sent: 02/22/2017   9:33 AM To: Algernon Huxley, RN  Yes please, and yes.  Sorry, I thought that York Cerise said that she was going to call you and let you know that same day when the patient was still here.  Thank you!  ----- Message ----- From: Algernon Huxley, RN Sent: 02/21/2017   1:24 PM To: Loralie Champagne, PA-C  Jess,  Do you want this pt set up for Iron? Do you want  2 doses of Feraheme? Please advise.  Thanks, Vaughan Basta ----- Message ----- From: Wyline Beady, CMA Sent: 02/21/2017   1:06 PM To: Algernon Huxley, RN  Otilio Connors,   When this patient was in the office Jess said she would contact you about setting this patient up for iron infusions since she is going to have an ECL with Henrene Pastor.   Did she tell you this? Sorry but the patient is calling so I just wanted to check.

## 2017-02-23 NOTE — Telephone Encounter (Signed)
Spoke with pt and she is aware of appt dates and times.

## 2017-02-24 ENCOUNTER — Other Ambulatory Visit: Payer: Self-pay | Admitting: Primary Care

## 2017-02-24 DIAGNOSIS — K219 Gastro-esophageal reflux disease without esophagitis: Secondary | ICD-10-CM

## 2017-03-01 ENCOUNTER — Ambulatory Visit (INDEPENDENT_AMBULATORY_CARE_PROVIDER_SITE_OTHER)
Admission: RE | Admit: 2017-03-01 | Discharge: 2017-03-01 | Disposition: A | Discharge status: Home / Self Care | Payer: Medicare Other | Source: Ambulatory Visit | Attending: Primary Care | Admitting: Primary Care

## 2017-03-01 ENCOUNTER — Encounter: Payer: Self-pay | Admitting: Primary Care

## 2017-03-01 ENCOUNTER — Ambulatory Visit (INDEPENDENT_AMBULATORY_CARE_PROVIDER_SITE_OTHER): Payer: Medicare Other | Admitting: Primary Care

## 2017-03-01 VITALS — BP 124/68 | HR 106 | Temp 98.9°F | Ht 62.0 in | Wt 211.8 lb

## 2017-03-01 DIAGNOSIS — D509 Iron deficiency anemia, unspecified: Secondary | ICD-10-CM

## 2017-03-01 DIAGNOSIS — M79641 Pain in right hand: Secondary | ICD-10-CM | POA: Diagnosis not present

## 2017-03-01 NOTE — Progress Notes (Signed)
Subjective:    Patient ID: Katherine [name], female    DOB: 03/31/1951, 66 y.o.   MRN: [id number]  HPI  Katherine [name] is a 65 year old female who presents today  Evaluated several weeks ago for complaints of fatigue, shortness of breath. Recent labs with hemaglobin of 9.2 with MCV of 69 and platelets of 465. She was then referred to GI for further evaluation of potential GI bleeding. She was initiated on oral iron tablets.Repeat 2 weeks later with hemoglobin of 8.9, MCV of 68.  Since her last visit she was evaluate by GI and is scheduled for an iron infusion and is set up to have a colonoscopy and endoscopy. She's continued to feel fatigued/sluggish, shortness of breath, difficulty sleeping. She will get shortness of breath with walking short distances. She denies palpitations.   2) Hand Pain: Located to right posterior hand proximal to 4th and 5th digits since falling 6 days ago. She's experienced moderate bruising with pain and swelling that hasn't improved. She's been taking tylenol recently.   Review of Systems  Constitutional: Positive for fatigue.  Respiratory: Positive for shortness of breath.   Cardiovascular: Negative for chest pain and palpitations.  Musculoskeletal:       Right hand pain  Neurological: Positive for dizziness.       Past Medical History:  Diagnosis Date  . Anxiety   . Arthritis   . Borderline diabetes   . 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 Chicken pox    age 29  . Chronic back pain    Compressed discs. [place]  . Depression   . GERD (gastroesophageal reflux disease)   . High cholesterol   . Hypertension   . Hypokalemia   . Migraine headache   . Obesity   . Pre-diabetes    okay now      Social History   Social History  . Marital status: Married    Spouse name: N/A  . Number of children: N/A  . Years of education:  N/A   Occupational History  . Teacher [employer]   Social History Main Topics  . Smoking status: Never Smoker  . Smokeless tobacco: Never Used  . Alcohol use No  . Drug use: No  . Sexual activity: Yes    Partners: Male    Birth control/ protection: Post-menopausal   Other Topics Concern  . Not on file   Social History Narrative   Married.   One son is 33, lives in [place].   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   Enjoys substitute teaching, gardening, cooking.    Past Surgical History:  Procedure Laterality Date  . BLADDER SUSPENSION  2005  . CARDIAC CATHETERIZATION    . CESAREAN SECTION    . ENDOMETRIAL ABLATION  2007  . FOOT SURGERY     bilateral bunions  . LUMBAR LAMINECTOMY/DECOMPRESSION MICRODISCECTOMY Bilateral 01/12/2016   Procedure: Bilateral L4-5 Laminotomy/Foraminotomy;  Surgeon: Newman Pies, MD;  Location: [facility];  Service: Neurosurgery;  Laterality: Bilateral;  Bilateral L4-5 Laminotomy/Foraminotomy  . TONSILLECTOMY  1971    Family History  Problem Relation Age of Onset  . Heart disease Mother   . Hypertension Mother   . Heart failure Mother        CHF  . Diabetes Mother   . Cancer Mother        CERVICAL CANCER  .  Heart disease Father   . Diabetes Father   . Liver disease Father   . Hypertension Father   . Diabetes Sister   . Heart disease Brother   . Heart failure Brother        CHF  . Diabetes Brother   . Cancer Brother 74       THROAT AND NECK  . Cancer Maternal Grandfather     Allergies  Allergen Reactions  . Codeine Anxiety  . Darvon Anxiety  . Demerol [Meperidine] Anxiety  . Hydrocodone Anxiety  . Oxycodone Anxiety  . Tramadol Anxiety  . Victoza [Liraglutide] Nausea Only and Other (See Comments)    And weakness    Current Outpatient Prescriptions on File Prior to Visit  Medication Sig Dispense Refill  . acetaminophen (TYLENOL 8 HOUR) 650 MG CR tablet Take 1 tablet (650 mg total) by mouth every 8 (eight) hours as  needed for pain. 50 tablet 0  . ARIPiprazole (ABILIFY) 5 MG tablet Take 2.5 mg by mouth daily.    . carvedilol (COREG) 12.5 MG tablet TAKE 1 TABLET (12.5 MG TOTAL) BY MOUTH 2 (TWO) TIMES DAILY. 180 tablet 3  . cetirizine (ZYRTEC) 10 MG tablet Take 10 mg by mouth daily as needed for allergies.    . clorazepate (TRANXENE) 15 MG tablet Take 15 mg by mouth at bedtime.     . docusate sodium (COLACE) 100 MG capsule Take 100 mg by mouth at bedtime.    Marland Kitchen doxepin (SINEQUAN) 25 MG capsule TAKE ONE CAPSULE BY MOUTH 45MIN BEFORE BED  0  . DULoxetine (CYMBALTA) 30 MG capsule Take 30 mg by mouth daily.  0  . DULoxetine (CYMBALTA) 60 MG capsule Take 60 mg by mouth daily.   0  . Multiple Vitamins-Minerals (AIRBORNE) CHEW Chew 3 tablets by mouth daily.    . naproxen sodium (ANAPROX) 220 MG tablet Take 440 mg by mouth 2 (two) times daily with a meal.    . pantoprazole (PROTONIX) 40 MG tablet TAKE 1 TABLET BY MOUTH EVERY DAY 30 tablet 5  . simvastatin (ZOCOR) 20 MG tablet TAKE 1 TABLET EVERY DAY 90 tablet 3  . spironolactone (ALDACTONE) 25 MG tablet Take 1 tablet (25 mg total) by mouth 2 (two) times daily. 180 tablet 3  . topiramate (TOPAMAX) 100 MG tablet Take 100 mg by mouth daily.   1  . Na Sulfate-K Sulfate-Mg Sulf (SUPREP BOWEL PREP KIT) 17.5-3.13-1.6 GM/180ML SOLN Take 1 kit by mouth as directed. (Patient not taking: Reported on 03/01/2017) 324 mL 0   No current facility-administered medications on file prior to visit.     BP 124/68   Pulse (!) 106   Temp 98.9 F (37.2 C) (Oral)   Ht 5' 2"  (1.575 m)   Wt 211 lb 12.8 oz (96.1 kg)   SpO2 98%   BMI 38.74 kg/m    Objective:   Physical Exam  Constitutional: She appears well-nourished.  Neck: Neck supple.  Cardiovascular: Normal rate and regular rhythm.   Pulmonary/Chest: Effort normal and breath sounds normal. She has no wheezes. She has no rales.  Skin: Skin is warm and dry.  Appears pale          Assessment & Plan:  Acute Hand  Pain:  Since fall onto hand 6 days ago. Exam today with moderate bruising, decrease in ROM, moderate pain. Check xray today given pain and little to no improvement with healing. Discussed tylenol, ice, rest.   Sheral Flow, NP

## 2017-03-01 NOTE — Assessment & Plan Note (Signed)
Evaluated by GI last week, scheduled for iron infusion x 2 this week and next. Also scheduled for colonoscopy and endoscopy in October. Will repeat CBC today, she is complaint to her oral iron BID. Strongly suspect all symptoms secondary to anemia.

## 2017-03-01 NOTE — Patient Instructions (Signed)
Complete lab work and xray prior to leaving today. I will notify you of your results once received.   Continue the oral iron tablets as discussed.  Follow up for your iron infusion as scheduled.  It was a pleasure to see you today!

## 2017-03-02 ENCOUNTER — Other Ambulatory Visit: Payer: Self-pay | Admitting: Primary Care

## 2017-03-02 ENCOUNTER — Encounter: Payer: Self-pay | Admitting: Primary Care

## 2017-03-02 DIAGNOSIS — S62316A Displaced fracture of base of fifth metacarpal bone, right hand, initial encounter for closed fracture: Secondary | ICD-10-CM

## 2017-03-02 LAB — CBC
HEMATOCRIT: 33.4 % — AB (ref 36.0–46.0)
Hemoglobin: 9.1 g/dL — ABNORMAL LOW (ref 12.0–15.0)
MCHC: 27.2 g/dL — ABNORMAL LOW (ref 30.0–36.0)
MCV: 70.7 fl — AB (ref 78.0–100.0)
Platelets: 615 10*3/uL — ABNORMAL HIGH (ref 150.0–400.0)
RBC: 4.73 Mil/uL (ref 3.87–5.11)
RDW: 20.2 % — AB (ref 11.5–15.5)
WBC: 14.1 10*3/uL — ABNORMAL HIGH (ref 4.0–10.5)

## 2017-03-04 ENCOUNTER — Encounter (HOSPITAL_COMMUNITY): Payer: Self-pay

## 2017-03-04 ENCOUNTER — Telehealth: Payer: Self-pay

## 2017-03-04 ENCOUNTER — Ambulatory Visit (HOSPITAL_COMMUNITY)
Admission: RE | Admit: 2017-03-04 | Discharge: 2017-03-04 | Disposition: A | Discharge status: Home / Self Care | Payer: Medicare Other | Source: Ambulatory Visit | Attending: Internal Medicine | Admitting: Internal Medicine

## 2017-03-04 DIAGNOSIS — D509 Iron deficiency anemia, unspecified: Secondary | ICD-10-CM | POA: Diagnosis not present

## 2017-03-04 HISTORY — DX: Anemia, unspecified: D64.9

## 2017-03-04 MED ORDER — SODIUM CHLORIDE 0.9 % IV SOLN
510.0000 mg | INTRAVENOUS | Status: DC
  Administered 2017-03-04: 510 mg via INTRAVENOUS
  Filled 2017-03-04: qty 17

## 2017-03-04 MED ORDER — SODIUM CHLORIDE 0.9 % IV SOLN
INTRAVENOUS | Status: DC
  Administered 2017-03-04: 10:00:00 via INTRAVENOUS

## 2017-03-04 NOTE — Discharge Instructions (Signed)
Anemia, Nonspecific Anemia is a condition in which the concentration of red blood cells or hemoglobin in the blood is below normal. Hemoglobin is a substance in red blood cells that carries oxygen to the tissues of the body. Anemia results in not enough oxygen reaching these tissues. What are the causes? Common causes of anemia include:  Excessive bleeding. Bleeding may be internal or external. This includes excessive bleeding from periods (in women) or from the intestine.  Poor nutrition.  Chronic kidney, thyroid, and liver disease.  Bone marrow disorders that decrease red blood cell production.  Cancer and treatments for cancer.  HIV, AIDS, and their treatments.  Spleen problems that increase red blood cell destruction.  Blood disorders.  Excess destruction of red blood cells due to infection, medicines, and autoimmune disorders.  What are the signs or symptoms?  Minor weakness.  Dizziness.  Headache.  Palpitations.  Shortness of breath, especially with exercise.  Paleness.  Cold sensitivity.  Indigestion.  Nausea.  Difficulty sleeping.  Difficulty concentrating. Symptoms may occur suddenly or they may develop slowly. How is this diagnosed? Additional blood tests are often needed. These help your health care provider determine the best treatment. Your health care provider will check your stool for blood and look for other causes of blood loss. How is this treated? Treatment varies depending on the cause of the anemia. Treatment can include:  Supplements of iron, vitamin X32, or folic acid.  Hormone medicines.  A blood transfusion. This may be needed if blood loss is severe.  Hospitalization. This may be needed if there is significant continual blood loss.  Dietary changes.  Spleen removal.  Follow these instructions at home: Keep all follow-up appointments. It often takes many weeks to correct anemia, and having your health care provider check on  your condition and your response to treatment is very important. Get help right away if:  You develop extreme weakness, shortness of breath, or chest pain.  You become dizzy or have trouble concentrating.  You develop heavy vaginal bleeding.  You develop a rash.  You have bloody or black, tarry stools.  You faint.  You vomit up blood.  You vomit repeatedly.  You have abdominal pain.  You have a fever or persistent symptoms for more than 2-3 days.  You have a fever and your symptoms suddenly get worse.  You are dehydrated. This information is not intended to replace advice given to you by your health care provider. Make sure you discuss any questions you have with your health care provider. Document Released: 08/12/2004 Document Revised: 12/17/2015 Document Reviewed: 12/29/2012 Elsevier Interactive Patient Education  2017 [place].  Ferumoxytol injection / infusion What is this medicine? FERUMOXYTOL is an iron complex. Iron is used to make healthy red blood cells, which carry oxygen and nutrients throughout the body. This medicine is used to treat iron deficiency anemia in people with chronic kidney disease. This medicine may be used for other purposes; ask your health care provider or pharmacist if you have questions. COMMON BRAND NAME(S): Feraheme What should I tell my health care provider before I take this medicine? They need to know if you have any of these conditions: -anemia not caused by low iron levels -high levels of iron in the blood -magnetic resonance imaging (MRI) test scheduled -an unusual or allergic reaction to iron, other medicines, foods, dyes, or preservatives -pregnant or trying to get pregnant -breast-feeding How should I use this medicine? This medicine is for injection into a vein. It  is given by a health care professional in a hospital or clinic setting. Talk to your pediatrician regarding the use of this medicine in children. Special care may  be needed. Overdosage: If you think you have taken too much of this medicine contact a poison control center or emergency room at once. NOTE: This medicine is only for you. Do not share this medicine with others. What if I miss a dose? It is important not to miss your dose. Call your doctor or health care professional if you are unable to keep an appointment. What may interact with this medicine? This medicine may interact with the following medications: -other iron products This list may not describe all possible interactions. Give your health care provider a list of all the medicines, herbs, non-prescription drugs, or dietary supplements you use. Also tell them if you smoke, drink alcohol, or use illegal drugs. Some items may interact with your medicine. What should I watch for while using this medicine? Visit your doctor or healthcare professional regularly. Tell your doctor or healthcare professional if your symptoms do not start to get better or if they get worse. You may need blood work done while you are taking this medicine. You may need to follow a special diet. Talk to your doctor. Foods that contain iron include: whole grains/cereals, dried fruits, beans, or peas, leafy green vegetables, and organ meats (liver, kidney). What side effects may I notice from receiving this medicine? Side effects that you should report to your doctor or health care professional as soon as possible: -allergic reactions like skin rash, itching or hives, swelling of the face, lips, or tongue -breathing problems -changes in blood pressure -feeling faint or lightheaded, falls -fever or chills -flushing, sweating, or hot feelings -swelling of the ankles or feet Side effects that usually do not require medical attention (report to your doctor or health care professional if they continue or are bothersome): -diarrhea -headache -nausea, vomiting -stomach pain This list may not describe all possible side  effects. Call your doctor for medical advice about side effects. You may report side effects to FDA at 1-800-FDA-1088. Where should I keep my medicine? This drug is given in a hospital or clinic and will not be stored at home. NOTE: This sheet is a summary. It may not cover all possible information. If you have questions about this medicine, talk to your doctor, pharmacist, or health care provider.  2018 Elsevier/Gold Standard (2015-08-07 12:41:49)

## 2017-03-04 NOTE — Progress Notes (Signed)
Tolerated Feraheme infusion without problems, stayed for post infusion observation time and ambulated out with husband at side.

## 2017-03-04 NOTE — Telephone Encounter (Signed)
Pt left v/m requesting status of ortho referral. Pt request cb.

## 2017-03-04 NOTE — Telephone Encounter (Signed)
Appt made and patient aware.  

## 2017-03-07 ENCOUNTER — Encounter: Payer: Self-pay | Admitting: Primary Care

## 2017-03-07 ENCOUNTER — Ambulatory Visit: Payer: BC Managed Care – PPO | Admitting: Neurology

## 2017-03-08 ENCOUNTER — Other Ambulatory Visit: Payer: Self-pay | Admitting: Primary Care

## 2017-03-08 DIAGNOSIS — D509 Iron deficiency anemia, unspecified: Secondary | ICD-10-CM

## 2017-03-10 DIAGNOSIS — S62316A Displaced fracture of base of fifth metacarpal bone, right hand, initial encounter for closed fracture: Secondary | ICD-10-CM | POA: Diagnosis not present

## 2017-03-11 ENCOUNTER — Ambulatory Visit (HOSPITAL_COMMUNITY)
Admission: RE | Admit: 2017-03-11 | Discharge: 2017-03-11 | Disposition: A | Discharge status: Home / Self Care | Payer: Medicare Other | Source: Ambulatory Visit | Attending: Internal Medicine | Admitting: Internal Medicine

## 2017-03-11 ENCOUNTER — Encounter (HOSPITAL_COMMUNITY): Payer: Self-pay

## 2017-03-11 DIAGNOSIS — D509 Iron deficiency anemia, unspecified: Secondary | ICD-10-CM | POA: Diagnosis not present

## 2017-03-11 MED ORDER — SODIUM CHLORIDE 0.9 % IV SOLN
510.0000 mg | INTRAVENOUS | Status: AC
  Administered 2017-03-11: 510 mg via INTRAVENOUS
  Filled 2017-03-11: qty 17

## 2017-03-11 MED ORDER — SODIUM CHLORIDE 0.9 % IV SOLN
INTRAVENOUS | Status: AC
  Administered 2017-03-11: 12:00:00 via INTRAVENOUS

## 2017-03-14 ENCOUNTER — Encounter: Payer: Self-pay | Admitting: Primary Care

## 2017-03-14 DIAGNOSIS — D509 Iron deficiency anemia, unspecified: Secondary | ICD-10-CM

## 2017-03-17 ENCOUNTER — Encounter: Payer: Self-pay | Admitting: Primary Care

## 2017-03-17 ENCOUNTER — Other Ambulatory Visit (INDEPENDENT_AMBULATORY_CARE_PROVIDER_SITE_OTHER): Payer: Medicare Other

## 2017-03-17 ENCOUNTER — Ambulatory Visit (INDEPENDENT_AMBULATORY_CARE_PROVIDER_SITE_OTHER): Payer: Medicare Other | Admitting: Primary Care

## 2017-03-17 VITALS — BP 122/78 | HR 78 | Temp 98.0°F | Wt 215.4 lb

## 2017-03-17 DIAGNOSIS — D509 Iron deficiency anemia, unspecified: Secondary | ICD-10-CM | POA: Diagnosis not present

## 2017-03-17 DIAGNOSIS — R350 Frequency of micturition: Secondary | ICD-10-CM

## 2017-03-17 DIAGNOSIS — R7309 Other abnormal glucose: Secondary | ICD-10-CM | POA: Diagnosis not present

## 2017-03-17 LAB — POC URINALSYSI DIPSTICK (AUTOMATED)
BILIRUBIN UA: NEGATIVE
Glucose, UA: NEGATIVE
Ketones, UA: NEGATIVE
Leukocytes, UA: NEGATIVE
Nitrite, UA: NEGATIVE
PH UA: 6 (ref 5.0–8.0)
Protein, UA: NEGATIVE
RBC UA: NEGATIVE
SPEC GRAV UA: 1.015 (ref 1.010–1.025)
UROBILINOGEN UA: 0.2 U/dL

## 2017-03-17 LAB — CBC
HCT: 36.6 % (ref 36.0–46.0)
Hemoglobin: 10.7 g/dL — ABNORMAL LOW (ref 12.0–15.0)
MCHC: 29.3 g/dL — ABNORMAL LOW (ref 30.0–36.0)
MCV: 78.2 fl (ref 78.0–100.0)
Platelets: 341 10*3/uL (ref 150.0–400.0)
RBC: 4.68 Mil/uL (ref 3.87–5.11)
RDW: 36 % — AB (ref 11.5–15.5)
WBC: 9.3 10*3/uL (ref 4.0–10.5)

## 2017-03-17 LAB — IBC PANEL
IRON: 59 ug/dL (ref 42–145)
Saturation Ratios: 14.9 % — ABNORMAL LOW (ref 20.0–50.0)
TRANSFERRIN: 283 mg/dL (ref 212.0–360.0)

## 2017-03-17 LAB — FERRITIN: FERRITIN: 325.8 ng/mL — AB (ref 10.0–291.0)

## 2017-03-17 LAB — HEMOGLOBIN A1C: HEMOGLOBIN A1C: 5.2 % (ref 4.6–6.5)

## 2017-03-17 NOTE — Patient Instructions (Signed)
Complete lab work prior to leaving today. I will notify you of your results once received.   Try using AZO as discussed for symptoms.  Continue to push intake of water.  It was a pleasure to see you today!

## 2017-03-17 NOTE — Addendum Note (Signed)
Addended by: Jacqualin Combes on: 03/17/2017 01:01 PM   Modules accepted: Orders

## 2017-03-17 NOTE — Addendum Note (Signed)
Addended by: Ellamae Sia on: 03/17/2017 11:05 AM   Modules accepted: Orders

## 2017-03-17 NOTE — Progress Notes (Signed)
Subjective:    Patient ID: Katherine [name], female    DOB: 1950-11-12, 66 y.o.   MRN: [id number]  HPI  Katherine [name] is a 66 year old female who presents today with a chief complaint of urinary frequency. She also reports pelvic pressure, difficulty empty her bladder, urgency. She denies hematuria, vaginal itching, vaginal discharge, abdominal pain. Her symptoms began 4 days ago. She's not taken anything OTC for her symptoms. Has been drinking cranberry juice.   Review of Systems  Constitutional: Negative for fever.  Genitourinary: Positive for difficulty urinating, dysuria, frequency and pelvic pain. Negative for flank pain, hematuria and vaginal discharge.     Past Medical History:  Diagnosis Date  . Anemia 2018  . Anxiety   . Arthritis   . Borderline diabetes   . 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 Chicken pox    age 28  . Chronic back pain    Compressed discs. [place]  . Depression   . GERD (gastroesophageal reflux disease)   . High cholesterol   . Hypertension   . Hypokalemia   . Migraine headache   . Obesity   . Pre-diabetes    okay now      Social History   Social History  . Marital status: Married    Spouse name: N/A  . Number of children: N/A  . Years of education: N/A   Occupational History  . Teacher [employer]   Social History Main Topics  . Smoking status: Never Smoker  . Smokeless tobacco: Never Used  . Alcohol use No  . Drug use: No  . Sexual activity: Yes    Partners: Male    Birth control/ protection: Post-menopausal   Other Topics Concern  . Not on file   Social History Narrative   Married.   One son is 47, lives in [place].   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   Enjoys substitute teaching, gardening, cooking.    Past Surgical History:  Procedure Laterality Date  . BLADDER SUSPENSION  2005  . CARDIAC  CATHETERIZATION    . CESAREAN SECTION    . ENDOMETRIAL ABLATION  2007  . FOOT SURGERY     bilateral bunions  . LUMBAR LAMINECTOMY/DECOMPRESSION MICRODISCECTOMY Bilateral 01/12/2016   Procedure: Bilateral L4-5 Laminotomy/Foraminotomy;  Surgeon: Newman Pies, MD;  Location: [facility];  Service: Neurosurgery;  Laterality: Bilateral;  Bilateral L4-5 Laminotomy/Foraminotomy  . TONSILLECTOMY  1971    Family History  Problem Relation Age of Onset  . Heart disease Mother   . Hypertension Mother   . Heart failure Mother        CHF  . Diabetes Mother   . Cancer Mother        CERVICAL CANCER  . Heart disease Father   . Diabetes Father   . Liver disease Father   . Hypertension Father   . Diabetes Sister   . Heart disease Brother   . Heart failure Brother        CHF  . Diabetes Brother   . Cancer Brother 70       THROAT AND NECK  . Cancer Maternal Grandfather     Allergies  Allergen Reactions  . Codeine Anxiety  . Darvon Anxiety  . Demerol [Meperidine] Anxiety  . Hydrocodone Anxiety  . Oxycodone Anxiety  . Tramadol Anxiety  . Victoza [Liraglutide] Nausea Only and Other (  See Comments)    And weakness    Current Outpatient Prescriptions on File Prior to Visit  Medication Sig Dispense Refill  . acetaminophen (TYLENOL 8 HOUR) 650 MG CR tablet Take 1 tablet (650 mg total) by mouth every 8 (eight) hours as needed for pain. 50 tablet 0  . ARIPiprazole (ABILIFY) 5 MG tablet Take 2.5 mg by mouth daily.    . benztropine (COGENTIN) 1 MG tablet Take 1 mg by mouth at bedtime.     . carvedilol (COREG) 12.5 MG tablet TAKE 1 TABLET (12.5 MG TOTAL) BY MOUTH 2 (TWO) TIMES DAILY. 180 tablet 3  . cetirizine (ZYRTEC) 10 MG tablet Take 10 mg by mouth daily as needed for allergies.    . clorazepate (TRANXENE) 15 MG tablet Take 15 mg by mouth at bedtime.     . docusate sodium (COLACE) 100 MG capsule Take 100 mg by mouth at bedtime.    Marland Kitchen doxepin (SINEQUAN) 25 MG capsule TAKE ONE CAPSULE BY MOUTH  45MIN BEFORE BED  0  . DULoxetine (CYMBALTA) 30 MG capsule Take 30 mg by mouth daily.  0  . DULoxetine (CYMBALTA) 60 MG capsule Take 60 mg by mouth daily.   0  . Multiple Vitamins-Minerals (AIRBORNE) CHEW Chew 3 tablets by mouth daily.    . Na Sulfate-K Sulfate-Mg Sulf (SUPREP BOWEL PREP KIT) 17.5-3.13-1.6 GM/180ML SOLN Take 1 kit by mouth as directed. 324 mL 0  . pantoprazole (PROTONIX) 40 MG tablet TAKE 1 TABLET BY MOUTH EVERY DAY 30 tablet 5  . simvastatin (ZOCOR) 20 MG tablet TAKE 1 TABLET EVERY DAY 90 tablet 3  . spironolactone (ALDACTONE) 25 MG tablet Take 1 tablet (25 mg total) by mouth 2 (two) times daily. 180 tablet 3  . topiramate (TOPAMAX) 100 MG tablet Take 100 mg by mouth daily.   1   No current facility-administered medications on file prior to visit.     BP 122/78   Pulse 78   Temp 98 F (36.7 C) (Oral)   Wt 215 lb 6.4 oz (97.7 kg)   SpO2 96%   BMI 39.40 kg/m    Objective:   Physical Exam  Constitutional: She appears well-nourished.  Neck: Neck supple.  Cardiovascular: Normal rate and regular rhythm.   Pulmonary/Chest: Effort normal and breath sounds normal.  Abdominal: There is no CVA tenderness.  Skin: Skin is warm and dry.          Assessment & Plan:  Urinary Frequency:  Also with pelvic pressure and urgency x 4 days.  Exam today without CVA tenderness, appears well. UA: Negative.  Could be interstitial cystitis. Discussed stress reduction techniques. Check CBC, A1C.  Continue to push intake of water. She will update if no improvement in 3-4 days.  Sheral Flow, NP

## 2017-03-18 ENCOUNTER — Other Ambulatory Visit: Payer: Self-pay | Admitting: Primary Care

## 2017-03-18 ENCOUNTER — Ambulatory Visit
Admission: RE | Admit: 2017-03-18 | Discharge: 2017-03-18 | Disposition: A | Discharge status: Home / Self Care | Payer: Medicare Other | Source: Ambulatory Visit | Attending: Internal Medicine | Admitting: Internal Medicine

## 2017-03-18 ENCOUNTER — Other Ambulatory Visit: Payer: BC Managed Care – PPO

## 2017-03-18 ENCOUNTER — Ambulatory Visit: Payer: BC Managed Care – PPO | Admitting: Primary Care

## 2017-03-18 DIAGNOSIS — N63 Unspecified lump in unspecified breast: Secondary | ICD-10-CM

## 2017-03-18 DIAGNOSIS — Z1231 Encounter for screening mammogram for malignant neoplasm of breast: Secondary | ICD-10-CM

## 2017-03-18 DIAGNOSIS — D509 Iron deficiency anemia, unspecified: Secondary | ICD-10-CM

## 2017-03-20 ENCOUNTER — Encounter: Payer: Self-pay | Admitting: Primary Care

## 2017-03-28 ENCOUNTER — Encounter: Payer: Self-pay | Admitting: Primary Care

## 2017-03-28 ENCOUNTER — Other Ambulatory Visit: Payer: Medicare Other

## 2017-03-28 DIAGNOSIS — S62316D Displaced fracture of base of fifth metacarpal bone, right hand, subsequent encounter for fracture with routine healing: Secondary | ICD-10-CM | POA: Diagnosis not present

## 2017-03-29 ENCOUNTER — Ambulatory Visit
Admission: RE | Admit: 2017-03-29 | Discharge: 2017-03-29 | Disposition: A | Discharge status: Home / Self Care | Payer: Medicare Other | Source: Ambulatory Visit | Attending: Primary Care | Admitting: Primary Care

## 2017-03-29 ENCOUNTER — Other Ambulatory Visit: Payer: Self-pay | Admitting: Primary Care

## 2017-03-29 DIAGNOSIS — N6489 Other specified disorders of breast: Secondary | ICD-10-CM | POA: Diagnosis not present

## 2017-03-29 DIAGNOSIS — R928 Other abnormal and inconclusive findings on diagnostic imaging of breast: Secondary | ICD-10-CM | POA: Diagnosis not present

## 2017-03-29 DIAGNOSIS — N63 Unspecified lump in unspecified breast: Secondary | ICD-10-CM

## 2017-03-29 DIAGNOSIS — M7989 Other specified soft tissue disorders: Secondary | ICD-10-CM

## 2017-03-30 ENCOUNTER — Ambulatory Visit (INDEPENDENT_AMBULATORY_CARE_PROVIDER_SITE_OTHER): Payer: Medicare Other

## 2017-03-30 ENCOUNTER — Encounter: Payer: Self-pay | Admitting: Primary Care

## 2017-03-30 ENCOUNTER — Other Ambulatory Visit (INDEPENDENT_AMBULATORY_CARE_PROVIDER_SITE_OTHER): Payer: BC Managed Care – PPO

## 2017-03-30 DIAGNOSIS — D509 Iron deficiency anemia, unspecified: Secondary | ICD-10-CM

## 2017-03-30 DIAGNOSIS — Z23 Encounter for immunization: Secondary | ICD-10-CM

## 2017-03-30 LAB — CBC
HCT: 40.4 % (ref 36.0–46.0)
Hemoglobin: 12.2 g/dL (ref 12.0–15.0)
MCHC: 30.2 g/dL (ref 30.0–36.0)
MCV: 81.7 fl (ref 78.0–100.0)
Platelets: 358 10*3/uL (ref 150.0–400.0)
RBC: 4.95 Mil/uL (ref 3.87–5.11)
RDW: 34.9 % — ABNORMAL HIGH (ref 11.5–15.5)
WBC: 8.4 10*3/uL (ref 4.0–10.5)

## 2017-04-17 NOTE — Progress Notes (Deleted)
Patient ID: Katherine [name], female   DOB: Dec 30, 1950, 66 y.o.   MRN: [id number] Cardiology Office Note  Date:  04/17/2017   ID:  Katherine, [name] 09-20-1950, MRN [id number]  PCP:  [name], NP   No chief complaint on file.   HPI:  Katherine [name] is a 66 y/o woman with a h/o  CP   no CAD on cath 2007,  HTN  obesity.  Previous echo showed EF 65% no signifcant valvular disease.  Possible OSA by hx Katherine [name] continues to battle with her weight.   She returns today for routine follow-up Of her blood pressure, history of hypokalemia  In follow-up today, she reports that she is doing well, unable to exercise secondary to chronic back pain Scheduled for back surgery with Dr. 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  Previously started on spironolactone for low potassium Lab work from 2016 reviewed with her, total cholesterol is excellent, 130s  EKG shows normal sinus rhythm with rate 91 beats per minute with no significant ST or T wave changes  PMH:   has a past medical history of Anemia (2018); Anxiety; Arthritis; Borderline diabetes; Chest pain; Chicken pox; Chronic back pain; Depression; GERD (gastroesophageal reflux disease); High cholesterol; Hypertension; Hypokalemia; Migraine headache; Obesity; and Pre-diabetes.  PSH:    Past Surgical History:  Procedure Laterality Date  . BLADDER SUSPENSION  2005  . CARDIAC CATHETERIZATION    . CESAREAN SECTION    . ENDOMETRIAL ABLATION  2007  . FOOT SURGERY     bilateral bunions  . LUMBAR LAMINECTOMY/DECOMPRESSION MICRODISCECTOMY Bilateral 01/12/2016   Procedure: Bilateral L4-5 Laminotomy/Foraminotomy;  Surgeon: Newman Pies, MD;  Location: [facility];  Service: Neurosurgery;  Laterality: Bilateral;  Bilateral L4-5 Laminotomy/Foraminotomy  . TONSILLECTOMY  1971    Current Outpatient Prescriptions  Medication Sig Dispense Refill  .  acetaminophen (TYLENOL 8 HOUR) 650 MG CR tablet Take 1 tablet (650 mg total) by mouth every 8 (eight) hours as needed for pain. 50 tablet 0  . ARIPiprazole (ABILIFY) 5 MG tablet Take 2.5 mg by mouth daily.    . benztropine (COGENTIN) 1 MG tablet Take 1 mg by mouth at bedtime.     . carvedilol (COREG) 12.5 MG tablet TAKE 1 TABLET (12.5 MG TOTAL) BY MOUTH 2 (TWO) TIMES DAILY. 180 tablet 3  . cetirizine (ZYRTEC) 10 MG tablet Take 10 mg by mouth daily as needed for allergies.    . clorazepate (TRANXENE) 15 MG tablet Take 15 mg by mouth at bedtime.     . docusate sodium (COLACE) 100 MG capsule Take 100 mg by mouth at bedtime.    Marland Kitchen doxepin (SINEQUAN) 25 MG capsule TAKE ONE CAPSULE BY MOUTH 45MIN BEFORE BED  0  . DULoxetine (CYMBALTA) 30 MG capsule Take 30 mg by mouth daily.  0  . DULoxetine (CYMBALTA) 60 MG capsule Take 60 mg by mouth daily.   0  . Multiple Vitamins-Minerals (AIRBORNE) CHEW Chew 3 tablets by mouth daily.    . Na Sulfate-K Sulfate-Mg Sulf (SUPREP BOWEL PREP KIT) 17.5-3.13-1.6 GM/180ML SOLN Take 1 kit by mouth as directed. 324 mL 0  . pantoprazole (PROTONIX) 40 MG tablet TAKE 1 TABLET BY MOUTH EVERY DAY 30 tablet 5  . simvastatin (ZOCOR) 20 MG tablet TAKE 1 TABLET EVERY DAY 90 tablet 3  . spironolactone (ALDACTONE) 25 MG tablet Take 1 tablet (25 mg total) by  mouth 2 (two) times daily. 180 tablet 3  . topiramate (TOPAMAX) 100 MG tablet Take 100 mg by mouth daily.   1   No current facility-administered medications for this visit.      Allergies:   Codeine; Darvon; Demerol [meperidine]; Hydrocodone; Oxycodone; Tramadol; and Victoza [liraglutide]   Social History:  The patient  reports that she has never smoked. She has never used smokeless tobacco. She reports that she does not drink alcohol or use drugs.   Family History:   family history includes Breast cancer (age of onset: 23) in her sister; Cancer in her maternal grandfather and mother; Cancer (age of onset: 63) in her brother;  Diabetes in her brother, father, mother, and sister; Heart disease in her brother, father, and mother; Heart failure in her brother and mother; Hypertension in her father and mother; Liver disease in her father.    Review of Systems: Review of Systems  Constitutional: Negative.   Respiratory: Negative.   Cardiovascular: Negative.   Gastrointestinal: Negative.   Musculoskeletal: Positive for back pain.  Neurological: Negative.   Psychiatric/Behavioral: Negative.   All other systems reviewed and are negative.    PHYSICAL EXAM: VS:  There were no vitals taken for this visit. , BMI There is no height or weight on file to calculate BMI. GEN: Well nourished, well developed, in no acute distress HEENT: normal Neck: no JVD, carotid bruits, or masses Cardiac: RRR; no murmurs, rubs, or gallops,no edema  Respiratory:  clear to auscultation bilaterally, normal work of breathing GI: soft, nontender, nondistended, + BS MS: no deformity or atrophy Skin: warm and dry, no rash Neuro:  Strength and sensation are intact Psych: euthymic mood, full affect    Recent Labs: 07/16/2016: BUN 8; Creatinine, Ser 0.71; Potassium 3.5; Sodium 138 02/03/2017: ALT 14; TSH 2.05 03/30/2017: Hemoglobin 12.2; Platelets 358.0    Lipid Panel Lab Results  Component Value Date   CHOL 164 03/04/2016   HDL 63.50 03/04/2016   LDLCALC 75 03/04/2016   TRIG 129.0 03/04/2016      Wt Readings from Last 3 Encounters:  03/17/17 215 lb 6.4 oz (97.7 kg)  03/11/17 213 lb 3.2 oz (96.7 kg)  03/04/17 213 lb 9.6 oz (96.9 kg)       ASSESSMENT AND PLAN:  HYPERTENSION, BENIGN - Plan: EKG 12-Lead Blood pressure is well controlled on today's visit. No changes made to the medications.  Hyperlipidemia Cholesterol is at goal on the current lipid regimen. No changes to the medications were made. Simvastatin renewed  HYPOKALEMIA Encouraged her to stay on her Aldactone   Obesity After back surgery, recommended a  regular exercise regimen, dietary changes for weight loss  Back pain, unspecified location Acceptable risk for upcoming back surgery, no significant active cardiac issues at this time No further testing needed   Total encounter time more than 15 minutes  Greater than 50% was spent in counseling and coordination of care with the patient    Disposition:   F/U  12 months   No orders of the defined types were placed in this encounter.    Signed, Esmond Plants, M.D., Ph.D. 04/17/2017  [place], [place]

## 2017-04-18 ENCOUNTER — Telehealth: Payer: Self-pay | Admitting: Internal Medicine

## 2017-04-18 ENCOUNTER — Ambulatory Visit: Payer: BC Managed Care – PPO | Admitting: Cardiovascular Disease

## 2017-04-18 NOTE — Telephone Encounter (Signed)
Spoke with pt and discussed with her that any medication that she absolutely has to take to take as early in the am as she can with a small sip of water. Pt verbalized understanding.

## 2017-04-22 ENCOUNTER — Other Ambulatory Visit: Payer: Self-pay

## 2017-04-22 ENCOUNTER — Encounter: Payer: Self-pay | Admitting: Internal Medicine

## 2017-04-22 ENCOUNTER — Ambulatory Visit (AMBULATORY_SURGERY_CENTER): Payer: Medicare Other | Admitting: Internal Medicine

## 2017-04-22 VITALS — BP 110/55 | HR 80 | Temp 95.9°F | Resp 11 | Ht 63.0 in | Wt 218.0 lb

## 2017-04-22 DIAGNOSIS — D509 Iron deficiency anemia, unspecified: Secondary | ICD-10-CM

## 2017-04-22 DIAGNOSIS — K222 Esophageal obstruction: Secondary | ICD-10-CM | POA: Diagnosis not present

## 2017-04-22 DIAGNOSIS — D122 Benign neoplasm of ascending colon: Secondary | ICD-10-CM

## 2017-04-22 DIAGNOSIS — K5900 Constipation, unspecified: Secondary | ICD-10-CM | POA: Diagnosis not present

## 2017-04-22 DIAGNOSIS — G4733 Obstructive sleep apnea (adult) (pediatric): Secondary | ICD-10-CM | POA: Diagnosis not present

## 2017-04-22 DIAGNOSIS — I10 Essential (primary) hypertension: Secondary | ICD-10-CM | POA: Diagnosis not present

## 2017-04-22 DIAGNOSIS — E669 Obesity, unspecified: Secondary | ICD-10-CM | POA: Diagnosis not present

## 2017-04-22 MED ORDER — SODIUM CHLORIDE 0.9 % IV SOLN: 500.0000 mL | INTRAVENOUS | Status: DC

## 2017-04-22 NOTE — Op Note (Signed)
[place] Patient Name: Katherine [name] Procedure Date: 04/22/2017 1:11 PM MRN: [id number] Endoscopist: Docia Chuck. Henrene Pastor , MD Age: 66 Referring MD:  Date of Birth: [birth year] Gender: Female Account #: [id number] Procedure:                Upper GI endoscopy, with biopsies Indications:              Unexplained iron deficiency anemia Medicines:                Monitored Anesthesia Care Procedure:                Pre-Anesthesia Assessment:                           - Prior to the procedure, a History and Physical                            was performed, and patient medications and                            allergies were reviewed. The patient's tolerance of                            previous anesthesia was also reviewed. The risks                            and benefits of the procedure and the sedation                            options and risks were discussed with the patient.                            All questions were answered, and informed consent                            was obtained. Prior Anticoagulants: The patient has                            taken no previous anticoagulant or antiplatelet                            agents. ASA Grade Assessment: II - A patient with                            mild systemic disease. After reviewing the risks                            and benefits, the patient was deemed in                            satisfactory condition to undergo the procedure.                           After obtaining informed consent, the endoscope was  passed under direct vision. Throughout the                            procedure, the patient's blood pressure, pulse, and                            oxygen saturations were monitored continuously. The                            Model GIF-[id number]) scope was introduced                            through the mouth, and advanced to the third part                            of  duodenum. The upper GI endoscopy was                            accomplished without difficulty. The patient                            tolerated the procedure well. Scope In: Scope Out: Findings:                 A mild Schatzki ring (acquired) was found in the                            lower third of the esophagus.                           The esophagus was otherwise normal.                           The stomach was normal.                           The examined duodenum was normal. Biopsies for                            histology were taken with a cold forceps for                            evaluation of celiac disease.                           The cardia and gastric fundus were normal on                            retroflexion. Complications:            No immediate complications. Estimated Blood Loss:     Estimated blood loss: none. Impression:               - Mild Schatzki ring.                           - Normal esophagus.                           -  Normal stomach.                           - Normal examined duodenum. Biopsied. Recommendation:           - Patient has a contact number available for                            emergencies. The signs and symptoms of potential                            delayed complications were discussed with the                            patient. Return to normal activities tomorrow.                            Written discharge instructions were provided to the                            patient.                           - Resume previous diet.                           - Continue present medications.                           - Follow-up biopsies                           - Office visit with Dr. Henrene Pastor in about 6 weeks.                            Obtain CBC and ferritin level (blood work) a few                            days prior to your visit. Docia Chuck. Henrene Pastor, MD 04/22/2017 2:36:35 PM This report has been signed electronically.

## 2017-04-22 NOTE — Progress Notes (Signed)
Called to room to assist during endoscopic procedure.  Patient ID and intended procedure confirmed with present staff. Received instructions for my participation in the procedure from the performing physician.  

## 2017-04-22 NOTE — Progress Notes (Signed)
To recovery, report to RN, VSS. 

## 2017-04-22 NOTE — Patient Instructions (Signed)
Impression/Recommendations:  Polyp handout given to patient. Diverticulosis handout given to patient.  Repeat colonoscopy in 5 years for surveillance.  Resume previous diet. Continue present medications.  Await pathology results.  Office visit with Dr. Henrene Pastor in about 6 weeks.  Obtain book work a few days prior to visit.  YOU HAD AN ENDOSCOPIC PROCEDURE TODAY AT [facility]:   Refer to the procedure report that was given to you for any specific questions about what was found during the examination.  If the procedure report does not answer your questions, please call your gastroenterologist to clarify.  If you requested that your care partner not be given the details of your procedure findings, then the procedure report has been included in a sealed envelope for you to review at your convenience later.  YOU SHOULD EXPECT: Some feelings of bloating in the abdomen. Passage of more gas than usual.  Walking can help get rid of the air that was put into your GI tract during the procedure and reduce the bloating. If you had a lower endoscopy (such as a colonoscopy or flexible sigmoidoscopy) you may notice spotting of blood in your stool or on the toilet paper. If you underwent a bowel prep for your procedure, you may not have a normal bowel movement for a few days.  Please Note:  You might notice some irritation and congestion in your nose or some drainage.  This is from the oxygen used during your procedure.  There is no need for concern and it should clear up in a day or so.  SYMPTOMS TO REPORT IMMEDIATELY:   Following lower endoscopy (colonoscopy or flexible sigmoidoscopy):  Excessive amounts of blood in the stool  Significant tenderness or worsening of abdominal pains  Swelling of the abdomen that is new, acute  Fever of 100F or higher   Following upper endoscopy (EGD)  Vomiting of blood or coffee ground material  New chest pain or pain under the shoulder  blades  Painful or persistently difficult swallowing  New shortness of breath  Fever of 100F or higher  Black, tarry-looking stools  For urgent or emergent issues, a gastroenterologist can be reached at any hour by calling [phone].   DIET:  We do recommend a small meal at first, but then you may proceed to your regular diet.  Drink plenty of fluids but you should avoid alcoholic beverages for 24 hours.  ACTIVITY:  You should plan to take it easy for the rest of today and you should NOT DRIVE or use heavy machinery until tomorrow (because of the sedation medicines used during the test).    FOLLOW UP: Our staff will call the number listed on your records the next business day following your procedure to check on you and address any questions or concerns that you may have regarding the information given to you following your procedure. If we do not reach you, we will leave a message.  However, if you are feeling well and you are not experiencing any problems, there is no need to return our call.  We will assume that you have returned to your regular daily activities without incident.  If any biopsies were taken you will be contacted by phone or by letter within the next 1-3 weeks.  Please call us at [phone] if you have not heard about the biopsies in 3 weeks.    SIGNATURES/CONFIDENTIALITY: You and/or your care partner have signed paperwork which will be entered into your electronic medical  record.  These signatures attest to the fact that that the information above on your After Visit Summary has been reviewed and is understood.  Full responsibility of the confidentiality of this discharge information lies with you and/or your care-partner. 

## 2017-04-22 NOTE — Op Note (Signed)
[place] Patient Name: Katherine [name] Procedure Date: 04/22/2017 1:11 PM MRN: [id number] Endoscopist: Docia Chuck. Henrene Pastor , MD Age: 66 Referring MD:  Date of Birth: 1951/01/19 Gender: Female Account #: [id number] Procedure:                Colonoscopy, with cold snare polypectomy x 1 Indications:              Unexplained iron deficiency anemia Medicines:                Monitored Anesthesia Care Procedure:                Pre-Anesthesia Assessment:                           - Prior to the procedure, a History and Physical                            was performed, and patient medications and                            allergies were reviewed. The patient's tolerance of                            previous anesthesia was also reviewed. The risks                            and benefits of the procedure and the sedation                            options and risks were discussed with the patient.                            All questions were answered, and informed consent                            was obtained. Prior Anticoagulants: The patient has                            taken no previous anticoagulant or antiplatelet                            agents. ASA Grade Assessment: II - A patient with                            mild systemic disease. After reviewing the risks                            and benefits, the patient was deemed in                            satisfactory condition to undergo the procedure.                           After obtaining informed consent, the colonoscope  was passed under direct vision. Throughout the                            procedure, the patient's blood pressure, pulse, and                            oxygen saturations were monitored continuously. The                            Model CF-HQ190L [id number]) scope was introduced                            through the anus and advanced to the the cecum,          identified by appendiceal orifice and ileocecal                            valve. The ileocecal valve, appendiceal orifice,                            and rectum were photographed. The quality of the                            bowel preparation was excellent. The colonoscopy                            was performed with SIGNIFICANT DIFFICULTY secondary                            to marked rectosigmoid stenosis in the region of                            diverticular disease, redundant colon, and body                            habitus. The patient tolerated the procedure well.                            The bowel preparation used was SUPREP. Scope In: 1:35:15 PM Scope Out: 2:14:00 PM Scope Withdrawal Time: 0 hours 23 minutes 7 seconds  Total Procedure Duration: 0 hours 38 minutes 45 seconds  Findings:                 A 9 mm polyp was found in the ascending colon. The                            polyp was sessile. The polyp was removed with a                            cold snare. Resection and retrieval were complete.                           Multiple diverticula were found in the sigmoid  colon with significant stenosis in the rectosigmoid                            region necessitating pediatric scope to navigate                            this region with difficulty.                           The exam was otherwise without abnormality on                            direct and retroflexion views. Complications:            No immediate complications. Estimated blood loss:                            None. Estimated Blood Loss:     Estimated blood loss: none. Impression:               - One 9 mm polyp in the ascending colon, removed                            with a cold snare. Resected and retrieved.                           - Diverticulosis in the sigmoid colon, with                            rectosigmoid stenosis.                           - The  examination was otherwise normal on direct                            and retroflexion views.                           - Technically difficult exam as described above Recommendation:           - Repeat colonoscopy in 5 years for surveillance.                           - Patient has a contact number available for                            emergencies. The signs and symptoms of potential                            delayed complications were discussed with the                            patient. Return to normal activities tomorrow.                            Written discharge instructions were provided to the  patient.                           - Resume previous diet.                           - Continue present medications.                           - Await pathology results.                           - EGD today. Please see report Docia Chuck. Henrene Pastor, MD 04/22/2017 2:32:10 PM This report has been signed electronically.

## 2017-04-25 ENCOUNTER — Telehealth: Payer: Self-pay | Admitting: *Deleted

## 2017-04-25 NOTE — Telephone Encounter (Signed)
  Follow up Call-  Call back number 04/22/2017  Post procedure Call Back phone  # [phone]  Permission to leave phone message Yes  Some recent data might be hidden     Patient questions:  Do you have a fever, pain , or abdominal swelling? No. Pain Score  0 *  Have you tolerated food without any problems? Yes.    Have you been able to return to your normal activities? Yes.    Do you have any questions about your discharge instructions: Diet   No. Medications  No. Follow up visit  No.  Do you have questions or concerns about your Care? No.  Actions: * If pain score is 4 or above: No action needed, pain <4.

## 2017-04-28 ENCOUNTER — Encounter: Payer: Self-pay | Admitting: Internal Medicine

## 2017-05-22 NOTE — Progress Notes (Deleted)
Patient ID: Katherine [name], female   DOB: 1950/08/17, 66 y.o.   MRN: [id number] Cardiology Office Note  Date:  05/22/2017   ID:  Katherine, [name] 05-11-1951, MRN [id number]  PCP:  [name], NP   No chief complaint on file.   HPI:  [name] is a 66 y/o woman with a h/o  CP with no CAD on cath 2007,  HTN  obesity.  Previous echo showed EF 65% no signifcant valvular disease.  Possible OSA by hx. She returns today for routine follow-up Of her blood pressure, hypokalemia   In follow-up today, she reports that she is doing well, unable to exercise secondary to chronic back pain Scheduled for back surgery with Dr. 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  Previously started on spironolactone for low potassium Lab work from 2016 reviewed with her, total cholesterol is excellent, 130s  EKG shows normal sinus rhythm with rate 91 beats per minute with no significant ST or T wave changes  PMH:   has a past medical history of Anemia (2018), Anxiety, Arthritis, Borderline diabetes, Chest pain, Chicken pox, Chronic back pain, Depression, GERD (gastroesophageal reflux disease), High cholesterol, Hypertension, Hypokalemia, Migraine headache, Obesity, and Pre-diabetes.  PSH:    Past Surgical History:  Procedure Laterality Date  . BLADDER SUSPENSION  2005  . CARDIAC CATHETERIZATION    . CESAREAN SECTION    . ENDOMETRIAL ABLATION  2007  . FOOT SURGERY     bilateral bunions  . TONSILLECTOMY  1971    Current Outpatient Medications  Medication Sig Dispense Refill  . acetaminophen (TYLENOL 8 HOUR) 650 MG CR tablet Take 1 tablet (650 mg total) by mouth every 8 (eight) hours as needed for pain. 50 tablet 0  . ARIPiprazole (ABILIFY) 5 MG tablet Take 2.5 mg by mouth daily.    . benztropine (COGENTIN) 1 MG tablet Take 1 mg by mouth at bedtime.     . carvedilol (COREG) 12.5 MG tablet TAKE 1 TABLET  (12.5 MG TOTAL) BY MOUTH 2 (TWO) TIMES DAILY. 180 tablet 3  . cetirizine (ZYRTEC) 10 MG tablet Take 10 mg by mouth daily as needed for allergies.    . clorazepate (TRANXENE) 15 MG tablet Take 15 mg by mouth at bedtime.     . docusate sodium (COLACE) 100 MG capsule Take 100 mg by mouth at bedtime.    Marland Kitchen doxepin (SINEQUAN) 10 MG capsule Take 10 mg by mouth at bedtime.  2  . doxepin (SINEQUAN) 25 MG capsule TAKE ONE CAPSULE BY MOUTH 45MIN BEFORE BED  0  . DULoxetine (CYMBALTA) 30 MG capsule Take 30 mg by mouth daily.  0  . DULoxetine (CYMBALTA) 60 MG capsule Take 60 mg by mouth daily.   0  . Multiple Vitamins-Minerals (AIRBORNE) CHEW Chew 3 tablets by mouth daily.    . pantoprazole (PROTONIX) 40 MG tablet TAKE 1 TABLET BY MOUTH EVERY DAY 30 tablet 5  . simvastatin (ZOCOR) 20 MG tablet TAKE 1 TABLET EVERY DAY 90 tablet 3  . spironolactone (ALDACTONE) 25 MG tablet Take 1 tablet (25 mg total) by mouth 2 (two) times daily. 180 tablet 3  . topiramate (TOPAMAX) 100 MG tablet Take 100 mg by mouth daily.   1   No current facility-administered medications for this visit.      Allergies:   Codeine; Darvon; Demerol [meperidine]; Hydrocodone; Oxycodone; Tramadol; and Victoza [liraglutide]  Social History:  The patient  reports that  has never smoked. she has never used smokeless tobacco. She reports that she does not drink alcohol or use drugs.   Family History:   family history includes Breast cancer (age of onset: 42) in her sister; Cancer in her maternal grandfather and mother; Cancer (age of onset: 44) in her brother; Diabetes in her brother, father, mother, and sister; Heart disease in her brother, father, and mother; Heart failure in her brother and mother; Hypertension in her father and mother; Liver disease in her father.    Review of Systems: Review of Systems  Constitutional: Negative.   Respiratory: Negative.   Cardiovascular: Negative.   Gastrointestinal: Negative.   Musculoskeletal:  Positive for back pain.  Neurological: Negative.   Psychiatric/Behavioral: Negative.   All other systems reviewed and are negative.    PHYSICAL EXAM: VS:  There were no vitals taken for this visit. , BMI There is no height or weight on file to calculate BMI. GEN: Well nourished, well developed, in no acute distress HEENT: normal Neck: no JVD, carotid bruits, or masses Cardiac: RRR; no murmurs, rubs, or gallops,no edema  Respiratory:  clear to auscultation bilaterally, normal work of breathing GI: soft, nontender, nondistended, + BS MS: no deformity or atrophy Skin: warm and dry, no rash Neuro:  Strength and sensation are intact Psych: euthymic mood, full affect    Recent Labs: 07/16/2016: BUN 8; Creatinine, Ser 0.71; Potassium 3.5; Sodium 138 02/03/2017: ALT 14; TSH 2.05 03/30/2017: Hemoglobin 12.2; Platelets 358.0    Lipid Panel Lab Results  Component Value Date   CHOL 164 03/04/2016   HDL 63.50 03/04/2016   LDLCALC 75 03/04/2016   TRIG 129.0 03/04/2016      Wt Readings from Last 3 Encounters:  04/22/17 218 lb (98.9 kg)  03/17/17 215 lb 6.4 oz (97.7 kg)  03/11/17 213 lb 3.2 oz (96.7 kg)       ASSESSMENT AND PLAN:  HYPERTENSION, BENIGN - Plan: EKG 12-Lead Blood pressure is well controlled on today's visit. No changes made to the medications.  Hyperlipidemia Cholesterol is at goal on the current lipid regimen. No changes to the medications were made. Simvastatin renewed  HYPOKALEMIA Encouraged her to stay on her Aldactone   Obesity After back surgery, recommended a regular exercise regimen, dietary changes for weight loss  Back pain, unspecified location Acceptable risk for upcoming back surgery, no significant active cardiac issues at this time No further testing needed   Total encounter time more than 15 minutes  Greater than 50% was spent in counseling and coordination of care with the patient    Disposition:   F/U  12 months   No orders of the  defined types were placed in this encounter.    Signed, Esmond Plants, M.D., Ph.D. 05/22/2017  [place], [place]

## 2017-05-26 ENCOUNTER — Ambulatory Visit: Payer: BC Managed Care – PPO | Admitting: Cardiovascular Disease

## 2017-05-30 DIAGNOSIS — F3175 Bipolar disorder, in partial remission, most recent episode depressed: Secondary | ICD-10-CM | POA: Diagnosis not present

## 2017-06-08 ENCOUNTER — Other Ambulatory Visit: Payer: Self-pay | Admitting: Cardiovascular Disease

## 2017-06-15 ENCOUNTER — Ambulatory Visit: Payer: Medicare Other | Admitting: Nurse Practitioner

## 2017-06-20 ENCOUNTER — Ambulatory Visit: Payer: Medicare Other | Admitting: Internal Medicine

## 2017-06-24 ENCOUNTER — Other Ambulatory Visit: Payer: Self-pay | Admitting: Cardiovascular Disease

## 2017-06-28 ENCOUNTER — Ambulatory Visit: Payer: Medicare Other | Admitting: Nurse Practitioner

## 2017-06-29 ENCOUNTER — Ambulatory Visit
Admission: RE | Admit: 2017-06-29 | Discharge: 2017-06-29 | Disposition: A | Discharge status: Home / Self Care | Payer: Medicare Other | Source: Ambulatory Visit | Attending: Primary Care | Admitting: Primary Care

## 2017-06-29 ENCOUNTER — Other Ambulatory Visit: Payer: Self-pay | Admitting: Primary Care

## 2017-06-29 DIAGNOSIS — M7989 Other specified soft tissue disorders: Secondary | ICD-10-CM

## 2017-06-29 DIAGNOSIS — N6489 Other specified disorders of breast: Secondary | ICD-10-CM | POA: Diagnosis not present

## 2017-06-29 DIAGNOSIS — R928 Other abnormal and inconclusive findings on diagnostic imaging of breast: Secondary | ICD-10-CM | POA: Diagnosis not present

## 2017-06-30 ENCOUNTER — Other Ambulatory Visit: Payer: Self-pay | Admitting: Primary Care

## 2017-06-30 DIAGNOSIS — D649 Anemia, unspecified: Secondary | ICD-10-CM

## 2017-07-04 ENCOUNTER — Other Ambulatory Visit (INDEPENDENT_AMBULATORY_CARE_PROVIDER_SITE_OTHER): Payer: Medicare Other

## 2017-07-04 DIAGNOSIS — D509 Iron deficiency anemia, unspecified: Secondary | ICD-10-CM | POA: Diagnosis not present

## 2017-07-04 DIAGNOSIS — D649 Anemia, unspecified: Secondary | ICD-10-CM

## 2017-07-04 LAB — CBC WITH DIFFERENTIAL/PLATELET
BASOS PCT: 1 % (ref 0.0–3.0)
Basophils Absolute: 0.1 10*3/uL (ref 0.0–0.1)
EOS PCT: 2.8 % (ref 0.0–5.0)
Eosinophils Absolute: 0.2 10*3/uL (ref 0.0–0.7)
HCT: 45.9 % (ref 36.0–46.0)
Hemoglobin: 14.4 g/dL (ref 12.0–15.0)
LYMPHS ABS: 1.9 10*3/uL (ref 0.7–4.0)
Lymphocytes Relative: 23.2 % (ref 12.0–46.0)
MCHC: 31.3 g/dL (ref 30.0–36.0)
MCV: 91.3 fl (ref 78.0–100.0)
MONO ABS: 0.6 10*3/uL (ref 0.1–1.0)
MONOS PCT: 6.6 % (ref 3.0–12.0)
NEUTROS PCT: 66.4 % (ref 43.0–77.0)
Neutro Abs: 5.6 10*3/uL (ref 1.4–7.7)
Platelets: 306 10*3/uL (ref 150.0–400.0)
RBC: 5.02 Mil/uL (ref 3.87–5.11)
RDW: 13.7 % (ref 11.5–15.5)
WBC: 8.4 10*3/uL (ref 4.0–10.5)

## 2017-07-04 LAB — IBC PANEL
IRON: 76 ug/dL (ref 42–145)
Saturation Ratios: 18.4 % — ABNORMAL LOW (ref 20.0–50.0)
Transferrin: 295 mg/dL (ref 212.0–360.0)

## 2017-07-04 LAB — FERRITIN: Ferritin: 20.8 ng/mL (ref 10.0–291.0)

## 2017-07-08 DIAGNOSIS — Z79899 Other long term (current) drug therapy: Secondary | ICD-10-CM | POA: Diagnosis not present

## 2017-07-08 DIAGNOSIS — R51 Headache: Secondary | ICD-10-CM | POA: Diagnosis not present

## 2017-07-10 ENCOUNTER — Other Ambulatory Visit: Payer: Self-pay | Admitting: Cardiovascular Disease

## 2017-07-13 ENCOUNTER — Other Ambulatory Visit: Payer: Self-pay | Admitting: Specialist

## 2017-07-13 DIAGNOSIS — R519 Headache, unspecified: Secondary | ICD-10-CM

## 2017-07-13 DIAGNOSIS — R51 Headache: Principal | ICD-10-CM

## 2017-07-14 ENCOUNTER — Telehealth: Payer: Self-pay | Admitting: Primary Care

## 2017-07-14 ENCOUNTER — Ambulatory Visit
Admission: RE | Admit: 2017-07-14 | Discharge: 2017-07-14 | Disposition: A | Discharge status: Home / Self Care | Payer: Medicare Other | Source: Ambulatory Visit | Attending: Specialist | Admitting: Specialist

## 2017-07-14 DIAGNOSIS — R51 Headache: Secondary | ICD-10-CM | POA: Diagnosis not present

## 2017-07-14 DIAGNOSIS — R519 Headache, unspecified: Secondary | ICD-10-CM

## 2017-07-14 DIAGNOSIS — F418 Other specified anxiety disorders: Secondary | ICD-10-CM

## 2017-07-14 MED ORDER — DIAZEPAM 5 MG PO TABS: ORAL_TABLET | ORAL | 0 refills | Status: DC

## 2017-07-14 NOTE — Telephone Encounter (Signed)
Patient was seen by Katherine [name] today.  Patient is asking for rx for Valium for an MRI she's having done today at 6:00.  Patient uses [facility].

## 2017-07-14 NOTE — Telephone Encounter (Signed)
Please call in diazepam 5 mg tablets as listed in chart. #4, no refills.

## 2017-07-14 NOTE — Telephone Encounter (Signed)
Called in medication to the pharmacy as instructed. 

## 2017-07-20 DIAGNOSIS — K0821 Minimal atrophy of the mandible: Secondary | ICD-10-CM | POA: Diagnosis not present

## 2017-07-22 DIAGNOSIS — F3131 Bipolar disorder, current episode depressed, mild: Secondary | ICD-10-CM | POA: Diagnosis not present

## 2017-08-02 ENCOUNTER — Ambulatory Visit: Payer: Medicare Other | Admitting: Nurse Practitioner

## 2017-08-12 ENCOUNTER — Ambulatory Visit: Payer: Medicare Other | Admitting: Internal Medicine

## 2017-08-14 ENCOUNTER — Other Ambulatory Visit: Payer: Self-pay | Admitting: Primary Care

## 2017-08-14 DIAGNOSIS — K219 Gastro-esophageal reflux disease without esophagitis: Secondary | ICD-10-CM

## 2017-08-23 ENCOUNTER — Ambulatory Visit: Payer: Medicare Other | Admitting: Nurse Practitioner

## 2017-08-23 ENCOUNTER — Encounter: Payer: Self-pay | Admitting: Nurse Practitioner

## 2017-08-23 VITALS — BP 140/90 | HR 102 | Ht 63.0 in | Wt 200.0 lb

## 2017-08-23 DIAGNOSIS — E782 Mixed hyperlipidemia: Secondary | ICD-10-CM

## 2017-08-23 DIAGNOSIS — I200 Unstable angina: Secondary | ICD-10-CM

## 2017-08-23 DIAGNOSIS — R0609 Other forms of dyspnea: Secondary | ICD-10-CM

## 2017-08-23 DIAGNOSIS — I10 Essential (primary) hypertension: Secondary | ICD-10-CM

## 2017-08-23 DIAGNOSIS — R0600 Dyspnea, unspecified: Secondary | ICD-10-CM

## 2017-08-23 MED ORDER — CHLORTHALIDONE 25 MG PO TABS: 25.0000 mg | ORAL_TABLET | Freq: Every day | ORAL | 3 refills | Status: DC

## 2017-08-23 NOTE — Patient Instructions (Addendum)
Medication Instructions:  Your physician has recommended you make the following change in your medication:  1- START Chlorthalidone 25 mg (1 tablet) by mouth once a day.   Labwork: Your physician recommends that you return for lab work in: [place] ([facility]).   Your physician recommends that you return for lab work in: 1 week at [facility] [employer] [facility].  - On 08/30/2017. - Please go to the [facility]. You will check in at the front desk to the right as you walk into the atrium. Valet Parking is offered if needed.     Testing/Procedures: Your physician has requested that you have an echocardiogram. Echocardiography is a painless test that uses sound waves to create images of your heart. It provides your doctor with information about the size and shape of your heart and how well your heart's chambers and valves are working. This procedure takes approximately one hour. There are no restrictions for this procedure.   Your physician has requested that you have a lexiscan myoview. For further information please visit [email or web address]. Please follow instruction sheet, as given.  [place]  Your caregiver has ordered a Stress Test with nuclear imaging. The purpose of this test is to evaluate the blood supply to your heart muscle. This procedure is referred to as a "Non-Invasive Stress Test." This is because other than having an IV started in your vein, nothing is inserted or "invades" your body. Cardiac stress tests are done to find areas of poor blood flow to the heart by determining the extent of coronary artery disease (CAD). Some patients exercise on a treadmill, which naturally increases the blood flow to your heart, while others who are  unable to walk on a treadmill due to physical limitations have a pharmacologic/chemical stress agent called Lexiscan . This medicine will mimic walking on a treadmill by temporarily increasing your coronary blood flow.   Please note: these test may take  anywhere between 2-4 hours to complete  PLEASE REPORT TO [facility] AT THE FIRST DESK WILL DIRECT YOU WHERE TO GO  Date of Procedure:______02/06/2019__________  Arrival Time for Procedure:_____09:15 am_________    PLEASE NOTIFY THE OFFICE AT LEAST 24 HOURS IN ADVANCE IF YOU ARE UNABLE TO KEEP YOUR APPOINTMENT.  [phone] AND  PLEASE NOTIFY NUCLEAR MEDICINE AT [facility] AT LEAST 24 HOURS IN ADVANCE IF YOU ARE UNABLE TO KEEP YOUR APPOINTMENT. [phone]  How to prepare for your Myoview test:  1. Do not eat or drink after midnight 2. No caffeine for 24 hours prior to test 3. No smoking 24 hours prior to test. 4. Your medication may be taken with water.  If your doctor stopped a medication because of this test, do not take that medication. 5. Ladies, please do not wear dresses.  Skirts or pants are appropriate. Please wear a short sleeve shirt. 6. No perfume, cologne or lotion. 7. Wear comfortable walking shoes. No heels!    Follow-Up: Your physician recommends that you schedule a follow-up appointment in: [place].  If you need a refill on your cardiac medications before your next appointment, please call your pharmacy.

## 2017-08-23 NOTE — Progress Notes (Signed)
Office Visit    Patient Name: Katherine [name] Date of Encounter: 08/23/2017  Primary Care Provider:  [name], NP Primary Cardiologist:  Ida Rogue, MD  Chief Complaint    67 year old female with a history of hypertension, hyperlipidemia, obesity, chest pain, dyspnea on exertion, iron deficiency anemia, and GERD who presents for follow-up related to elevated blood pressures/dyspnea/and exertional chest pain.  Past Medical History    Past Medical History:  Diagnosis Date  . Anemia 2018  . Anxiety   . Arthritis   . Borderline diabetes   . Chest pain    a. 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 Chicken pox    age 71  . Chronic back pain    Compressed discs. [place]  . Depression   . Dyspnea on exertion    a. 03/2009 Echo: EF 65%, no rwma, triv TR.  Marland Kitchen GERD (gastroesophageal reflux disease)   . High cholesterol   . Hypertension   . Hypokalemia   . Migraine headache   . Obesity   . Pre-diabetes    okay now    Past Surgical History:  Procedure Laterality Date  . BLADDER SUSPENSION  2005  . CARDIAC CATHETERIZATION    . CESAREAN SECTION    . ENDOMETRIAL ABLATION  2007  . FOOT SURGERY     bilateral bunions  . LUMBAR LAMINECTOMY/DECOMPRESSION MICRODISCECTOMY Bilateral 01/12/2016   Procedure: Bilateral L4-5 Laminotomy/Foraminotomy;  Surgeon: Newman Pies, MD;  Location: [facility];  Service: Neurosurgery;  Laterality: Bilateral;  Bilateral L4-5 Laminotomy/Foraminotomy  . TONSILLECTOMY  1971    Allergies  Allergies  Allergen Reactions  . Codeine Anxiety  . Darvon Anxiety  . Hydrocodone Anxiety  . Meperidine Anxiety  . Oxycodone Anxiety  . Tramadol Anxiety  . Victoza [Liraglutide] Nausea Only and Other (See Comments)    And weakness    History of Present Illness    68 year old female with the above past medical history including  hypertension, hyperlipidemia, obesity, chest pain, dyspnea on exertion, iron deficiency anemia, and GERD.  She had previous evaluation for chest pain with cardiac catheterization in 2007 revealing normal coronary arteries.  An echocardiogram was performed in September 2010 which showed an EF of 65% without regional wall motion abnormalities or significant valvular disease.  She was last seen in cardiology clinic in June 2017.  She says that from a cardiac standpoint, she has had chronic dyspnea on exertion, to the point that she limits her activities quite a bit.  Walking up any incline is very difficult for her secondary to dyspnea.  Over the past 2-3 months, she has also been experiencing exertional substernal chest discomfort associated with dyspnea.  Symptoms resolved within 5-10 minutes of rest.  Also, both blood pressures and heart rates have been elevated over the past year.  She says her heart rate is often in the 90s to low 100s at rest.  Blood pressures are typically in the 140s or greater.  She is also been experiencing bilateral hand swelling.  She is careful with her salt intake and uses "light salt."  She avoids processed foods and does not routinely eat out.  Despite having hand swelling, she has not been having any lower extremity edema, PND, orthopnea, change in abdominal girth, weight gain, or early satiety.  Home Medications    Prior to Admission medications   Medication Sig Start Date End Date Taking?  Authorizing Provider  acetaminophen (TYLENOL 8 HOUR) 650 MG CR tablet Take 1 tablet (650 mg total) by mouth every 8 (eight) hours as needed for pain. 07/18/16  Yes Consuella Lose, MD  carvedilol (COREG) 12.5 MG tablet TAKE 1 TABLET (12.5 MG TOTAL) BY MOUTH 2 (TWO) TIMES DAILY. 02/04/17  Yes Minna Merritts, MD  cetirizine (ZYRTEC) 10 MG tablet Take 10 mg by mouth daily as needed for allergies.   Yes [provider]  docusate sodium (COLACE) 100 MG capsule Take 100 mg by mouth  at bedtime.   Yes [provider]  DULoxetine (CYMBALTA) 30 MG capsule Take 30 mg by mouth daily. 12/04/15  Yes [provider]  DULoxetine (CYMBALTA) 60 MG capsule Take 60 mg by mouth daily.  08/30/14  Yes [provider]  pantoprazole (PROTONIX) 40 MG tablet TAKE 1 TABLET BY MOUTH EVERY DAY 08/15/17  Yes [name], NP  simvastatin (ZOCOR) 20 MG tablet TAKE 1 TABLET EVERY DAY 12/10/16  Yes Minna Merritts, MD  spironolactone (ALDACTONE) 25 MG tablet TAKE 1 TABLET BY MOUTH TWICE A DAY 07/11/17  Yes Gollan, Kathlene November, MD  topiramate (TOPAMAX) 100 MG tablet Take 100 mg by mouth daily.  08/31/14  Yes [provider]    Review of Systems    Dyspnea on exertion and exertional chest discomfort as outlined above.  She has been having some swelling in her hands.  She denies PND, orthopnea, dizziness, syncope, lower extremity edema, or early satiety.  All other systems reviewed and are otherwise negative except as noted above.  Physical Exam    VS:  BP 140/90 (BP Location: Left Arm, Patient Position: Sitting, Cuff Size: Normal)   Pulse (!) 102   Ht 5\' 3"  (1.6 m)   Wt 200 lb (90.7 kg)   BMI 35.43 kg/m  , BMI Body mass index is 35.43 kg/m. GEN: Well nourished, well developed, in no acute distress.  HEENT: normal.  Neck: Supple, obese, difficult to gauge JVP.  No carotid bruits, or masses. Cardiac: RRR, no murmurs, rubs, or gallops. No clubbing, cyanosis, edema.  Radials/DP/PT 2+ and equal bilaterally.  Respiratory:  Respirations regular and unlabored, clear to auscultation bilaterally. GI: Soft, nontender, nondistended, BS + x 4. MS: no deformity or atrophy. Skin: warm and dry, no rash. Neuro:  Strength and sensation are intact. Psych: Normal affect.  Accessory Clinical Findings    ECG -sinus tachycardia, 102, no acute ST or T changes.  Assessment & Plan    1.  Exertional chest pain/Unstable angina: Patient presents for evaluation with a 2-14-month  history of exertional chest discomfort associated with dyspnea.  She has some degree of chronic dyspnea on exertion.  ECG nonacute.  She has prior history of chest pain with normal coronary arteries on catheterization in 2007.  I will plan for a Lexiscan Myoview to rule out ischemia.  Continue beta-blocker and statin.  2.  Dyspnea on exertion: This is chronic but may be slightly worsened.  She says this is secondary to her weight there weight is actually reasonably stable.  She is not volume overloaded on exam but is mildly tachycardic and also notes some swelling of her hands.  She is hypertensive and I am adding chlorthalidone daily.  I will follow-up a basic metabolic panel today and in 1 week given prior history of hypokalemia.  I am arranging for an echocardiogram and stress testing as above.  3.  Essential hypertension: As above, added chlorthalidone.  Follow-up basic  metabolic panel today and in 1 week.  She does have a history of hypokalemia and is currently also on carvedilol and spironolactone.  4.  Snoring: Patient does snore.  She has never had a sleep study.  We will plan to refer to pulmonology but she would like to get testing as outlined above done first.  5.  Morbid obesity: Deconditioning may be playing a role in her symptoms.  Await testing for further recommendations.  6.  Hyperlipidemia: LDL was 75 in August 2017.  She remains on statin therapy.  7.  Disposition: Follow-up stress testing and echo as above.  Follow-up basic metabolic panel today and in 1 week.  Follow-up in clinic in 2 weeks or sooner if necessary.   Murray Hodgkins, NP 08/23/2017, 1:45 PM

## 2017-08-24 ENCOUNTER — Ambulatory Visit
Admission: RE | Admit: 2017-08-24 | Discharge: 2017-08-24 | Disposition: A | Discharge status: Home / Self Care | Payer: Medicare Other | Source: Ambulatory Visit | Attending: Nurse Practitioner | Admitting: Nurse Practitioner

## 2017-08-24 DIAGNOSIS — I200 Unstable angina: Secondary | ICD-10-CM | POA: Diagnosis not present

## 2017-08-24 LAB — BASIC METABOLIC PANEL
BUN / CREAT RATIO: 16 (ref 12–28)
BUN: 13 mg/dL (ref 8–27)
CO2: 17 mmol/L — ABNORMAL LOW (ref 20–29)
CREATININE: 0.79 mg/dL (ref 0.57–1.00)
Calcium: 10.2 mg/dL (ref 8.7–10.3)
Chloride: 108 mmol/L — ABNORMAL HIGH (ref 96–106)
GFR, EST AFRICAN AMERICAN: 91 mL/min/{1.73_m2} (ref >59)
GFR, EST NON AFRICAN AMERICAN: 79 mL/min/{1.73_m2} (ref >59)
Glucose: 123 mg/dL — ABNORMAL HIGH (ref 65–99)
Potassium: 4.5 mmol/L (ref 3.5–5.2)
Sodium: 146 mmol/L — ABNORMAL HIGH (ref 134–144)

## 2017-08-24 LAB — NM MYOCAR MULTI W/SPECT W/WALL MOTION / EF
CSEPEDS: 0 s
CSEPHR: 74 %
Estimated workload: 1 METS
Exercise duration (min): 0 min
LVDIAVOL: 29 mL (ref 46–106)
LVSYSVOL: 9 mL
MPHR: 155 {beats}/min
NUC STRESS TID: 0.94
Peak HR: 116 {beats}/min
Rest HR: 92 {beats}/min

## 2017-08-24 MED ORDER — REGADENOSON 0.4 MG/5ML IV SOLN
0.4000 mg | Freq: Once | INTRAVENOUS | Status: AC
  Administered 2017-08-24: 0.4 mg via INTRAVENOUS

## 2017-08-24 MED ORDER — TECHNETIUM TC 99M TETROFOSMIN IV KIT
13.2100 | PACK | Freq: Once | INTRAVENOUS | Status: AC | PRN
  Administered 2017-08-24: 13.21 via INTRAVENOUS

## 2017-08-24 MED ORDER — TECHNETIUM TC 99M TETROFOSMIN IV KIT
30.0000 | PACK | Freq: Once | INTRAVENOUS | Status: AC | PRN
  Administered 2017-08-24: 32.09 via INTRAVENOUS

## 2017-08-26 ENCOUNTER — Ambulatory Visit: Payer: Medicare Other | Admitting: Family Medicine

## 2017-08-26 ENCOUNTER — Ambulatory Visit: Payer: Medicare Other

## 2017-08-26 ENCOUNTER — Encounter: Payer: Self-pay | Admitting: Family Medicine

## 2017-08-26 VITALS — BP 120/78 | HR 100 | Temp 98.0°F | Wt 200.0 lb

## 2017-08-26 DIAGNOSIS — R350 Frequency of micturition: Secondary | ICD-10-CM

## 2017-08-26 DIAGNOSIS — N3001 Acute cystitis with hematuria: Secondary | ICD-10-CM

## 2017-08-26 DIAGNOSIS — N390 Urinary tract infection, site not specified: Secondary | ICD-10-CM | POA: Insufficient documentation

## 2017-08-26 LAB — POC URINALSYSI DIPSTICK (AUTOMATED)
PH UA: 6 (ref 5.0–8.0)
SPEC GRAV UA: 1.015 (ref 1.010–1.025)
UROBILINOGEN UA: 0.2 U/dL

## 2017-08-26 MED ORDER — CEPHALEXIN 500 MG PO CAPS: 500.0000 mg | ORAL_CAPSULE | Freq: Two times a day (BID) | ORAL | 0 refills | Status: DC

## 2017-08-26 NOTE — Patient Instructions (Signed)
You do have urine infection - treat with keflex 500mg  twice daily for 7 days. Push fluids and rest.  May continue tylenol for discomfort. Consider cranberry juice or tablets as well.  Let us know if not improving with treatment.  Urinary Tract Infection, Adult A urinary tract infection (UTI) is an infection of any part of the urinary tract. The urinary tract includes the:  Kidneys.  Ureters.  Bladder.  Urethra.  These organs make, store, and get rid of pee (urine) in the body. Follow these instructions at home:  Take over-the-counter and prescription medicines only as told by your doctor.  If you were prescribed an antibiotic medicine, take it as told by your doctor. Do not stop taking the antibiotic even if you start to feel better.  Avoid the following drinks: ? Alcohol. ? Caffeine. ? Tea. ? Carbonated drinks.  Drink enough fluid to keep your pee clear or pale yellow.  Keep all follow-up visits as told by your doctor. This is important.  Make sure to: ? Empty your bladder often and completely. Do not to hold pee for long periods of time. ? Empty your bladder before and after sex. ? Wipe from front to back after a bowel movement if you are female. Use each tissue one time when you wipe. Contact a doctor if:  You have back pain.  You have a fever.  You feel sick to your stomach (nauseous).  You throw up (vomit).  Your symptoms do not get better after 3 days.  Your symptoms go away and then come back. Get help right away if:  You have very bad back pain.  You have very bad lower belly (abdominal) pain.  You are throwing up and cannot keep down any medicines or water. This information is not intended to replace advice given to you by your health care provider. Make sure you discuss any questions you have with your health care provider. Document Released: 12/22/2007 Document Revised: 12/11/2015 Document Reviewed: 05/26/2015 Elsevier Interactive Patient  Education  [employer].

## 2017-08-26 NOTE — Progress Notes (Signed)
BP 120/78 (BP Location: Left Arm, Patient Position: Sitting, Cuff Size: Normal)   Pulse 100   Temp 98 F (36.7 C) (Oral)   Wt 200 lb (90.7 kg)   SpO2 97%   BMI 35.43 kg/m    CC: UTI? Subjective:    Patient ID: Katherine [name], female    DOB: [birth year], 67 y.o.   MRN: [id number]  HPI: Katherine [name] is a 67 y.o. female presenting on 08/26/2017 for Urinary Frequency (Started about 3 days ago. )   3d h/o urinary urgency and dysuria, incomplete emptying with frequency. Mild lower back pain.  Denies fevers/chills, hematuria, flank pain, nausea.  Tylenol has helped.  H/o bladder suspension.   Last UTI was remotely.  Hasn't started chlorthalidone yet - per cards.   Relevant past medical, surgical, family and social history reviewed and updated as indicated. Interim medical history since our last visit reviewed. Allergies and medications reviewed and updated. Outpatient Medications Prior to Visit  Medication Sig Dispense Refill  . acetaminophen (TYLENOL 8 HOUR) 650 MG CR tablet Take 1 tablet (650 mg total) by mouth every 8 (eight) hours as needed for pain. 50 tablet 0  . carvedilol (COREG) 12.5 MG tablet TAKE 1 TABLET (12.5 MG TOTAL) BY MOUTH 2 (TWO) TIMES DAILY. 180 tablet 3  . cetirizine (ZYRTEC) 10 MG tablet Take 10 mg by mouth daily as needed for allergies.    Marland Kitchen docusate sodium (COLACE) 100 MG capsule Take 100 mg by mouth at bedtime.    . DULoxetine (CYMBALTA) 30 MG capsule Take 30 mg by mouth daily.  0  . DULoxetine (CYMBALTA) 60 MG capsule Take 60 mg by mouth daily.   0  . pantoprazole (PROTONIX) 40 MG tablet TAKE 1 TABLET BY MOUTH EVERY DAY 30 tablet 5  . simvastatin (ZOCOR) 20 MG tablet TAKE 1 TABLET EVERY DAY 90 tablet 3  . spironolactone (ALDACTONE) 25 MG tablet TAKE 1 TABLET BY MOUTH TWICE A DAY 30 tablet 3  . topiramate (TOPAMAX) 100 MG tablet Take 100 mg by mouth daily.   1  . chlorthalidone (HYGROTON) 25 MG tablet Take 1 tablet (25 mg total) by mouth daily.  (Patient not taking: Reported on 08/26/2017) 90 tablet 3   No facility-administered medications prior to visit.      Per HPI unless specifically indicated in ROS section below Review of Systems     Objective:    BP 120/78 (BP Location: Left Arm, Patient Position: Sitting, Cuff Size: Normal)   Pulse 100   Temp 98 F (36.7 C) (Oral)   Wt 200 lb (90.7 kg)   SpO2 97%   BMI 35.43 kg/m   Wt Readings from Last 3 Encounters:  08/26/17 200 lb (90.7 kg)  08/23/17 200 lb (90.7 kg)  04/22/17 218 lb (98.9 kg)    Physical Exam  Constitutional: She appears well-developed and well-nourished. No distress.  Abdominal: Soft. Normal appearance and bowel sounds are normal. She exhibits no distension and no mass. There is no hepatosplenomegaly. There is no tenderness. There is no rigidity, no rebound, no guarding, no CVA tenderness and negative Murphy's sign.  Musculoskeletal: She exhibits no edema.  Psychiatric: She has a normal mood and affect.  Nursing note and vitals reviewed.  Results for orders placed or performed in visit on 08/26/17  POCT Urinalysis Dipstick (Automated)  Result Value Ref Range   Color, UA yellow    Clarity, UA cloudy    Glucose, UA -    Bilirubin, UA -  Ketones, UA -    Spec Grav, UA 1.015 1.010 - 1.025   Blood, UA 2+    pH, UA 6.0 5.0 - 8.0   Protein, UA 1+    Urobilinogen, UA 0.2 0.2 or 1.0 E.U./dL   Nitrite, UA -    Leukocytes, UA Large (3+) (A) Negative      Assessment & Plan:   Problem List Items Addressed This Visit    UTI (urinary tract infection) - Primary    Story and UA consistent with UTI - Rx keflex 1 wk course. UCx not sent. Update if ongoing symptoms past treatment. Supportive care reviewed with patient. Pt agrees with plan.       Relevant Medications   cephALEXin (KEFLEX) 500 MG capsule    Other Visit Diagnoses    Urinary frequency       Relevant Orders   POCT Urinalysis Dipstick (Automated) (Completed)       Follow up plan: Return  if symptoms worsen or fail to improve.  Ria Bush, MD

## 2017-08-26 NOTE — Assessment & Plan Note (Addendum)
Story and UA/micro consistent with UTI - Rx keflex 1 wk course. UCx not sent. Update if ongoing symptoms past treatment. Supportive care reviewed with patient. Pt agrees with plan.

## 2017-09-06 ENCOUNTER — Ambulatory Visit: Payer: Medicare Other | Admitting: Internal Medicine

## 2017-09-08 ENCOUNTER — Other Ambulatory Visit: Payer: Medicare Other

## 2017-09-09 ENCOUNTER — Ambulatory Visit: Payer: Medicare Other | Admitting: Cardiovascular Disease

## 2017-09-17 ENCOUNTER — Other Ambulatory Visit: Payer: Self-pay | Admitting: Cardiovascular Disease

## 2017-10-01 NOTE — Progress Notes (Deleted)
Patient ID: Katherine [name], female   DOB: 12-Jan-1951, 67 y.o.   MRN: [id number] Cardiology Office Note  Date:  10/01/2017   ID:  Katherine [name] 02-09-1951, MRN [id number]  PCP:  Katherine Koch, NP   No chief complaint on file.   HPI:  Katherine [name] is a 67 y/o woman with a h/o CP with no CAD on cath 2007, HTN and obesity. She returns today for routine follow-up Of her blood pressure, history of hypokalemia Previous echo showed EF 65% no signifcant valvular disease.  Possible OSA by hx. Katherine [name] continues to battle with her weight.   In follow-up today, she reports that she is doing well, unable to exercise secondary to chronic back pain Scheduled for back surgery with Dr. 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  Previously started on spironolactone for low potassium Lab work from 2016 reviewed with her, total cholesterol is excellent, 130s  EKG shows normal sinus rhythm with rate 91 beats per minute with no significant ST or T wave changes  PMH:   has a past medical history of Anemia (2018), Anxiety, Arthritis, Borderline diabetes, Chest pain, Chicken pox, Chronic back pain, Depression, Dyspnea on exertion, GERD (gastroesophageal reflux disease), High cholesterol, Hypertension, Hypokalemia, Migraine headache, Obesity, and Pre-diabetes.  PSH:    Past Surgical History:  Procedure Laterality Date  . BLADDER SUSPENSION  2005  . CARDIAC CATHETERIZATION    . CESAREAN SECTION    . ENDOMETRIAL ABLATION  2007  . FOOT SURGERY     bilateral bunions  . LUMBAR LAMINECTOMY/DECOMPRESSION MICRODISCECTOMY Bilateral 01/12/2016   Procedure: Bilateral L4-5 Laminotomy/Foraminotomy;  Surgeon: Newman Pies, MD;  Location: [facility];  Service: Neurosurgery;  Laterality: Bilateral;  Bilateral L4-5 Laminotomy/Foraminotomy  . TONSILLECTOMY  1971    Current Outpatient Medications  Medication Sig Dispense  Refill  . acetaminophen (TYLENOL 8 HOUR) 650 MG CR tablet Take 1 tablet (650 mg total) by mouth every 8 (eight) hours as needed for pain. 50 tablet 0  . carvedilol (COREG) 12.5 MG tablet TAKE 1 TABLET (12.5 MG TOTAL) BY MOUTH 2 (TWO) TIMES DAILY. 180 tablet 3  . cephALEXin (KEFLEX) 500 MG capsule Take 1 capsule (500 mg total) by mouth 2 (two) times daily. 14 capsule 0  . cetirizine (ZYRTEC) 10 MG tablet Take 10 mg by mouth daily as needed for allergies.    . chlorthalidone (HYGROTON) 25 MG tablet Take 1 tablet (25 mg total) by mouth daily. (Patient not taking: Reported on 08/26/2017) 90 tablet 3  . docusate sodium (COLACE) 100 MG capsule Take 100 mg by mouth at bedtime.    . DULoxetine (CYMBALTA) 30 MG capsule Take 30 mg by mouth daily.  0  . DULoxetine (CYMBALTA) 60 MG capsule Take 60 mg by mouth daily.   0  . pantoprazole (PROTONIX) 40 MG tablet TAKE 1 TABLET BY MOUTH EVERY DAY 30 tablet 5  . simvastatin (ZOCOR) 20 MG tablet TAKE 1 TABLET EVERY DAY 90 tablet 3  . spironolactone (ALDACTONE) 25 MG tablet TAKE 1 TABLET BY MOUTH TWICE A DAY 60 tablet 3  . topiramate (TOPAMAX) 100 MG tablet Take 100 mg by mouth daily.   1   No current facility-administered medications for this visit.      Allergies:   Codeine; Darvon; Hydrocodone; Meperidine; Oxycodone; Tramadol; and Victoza [liraglutide]   Social History:  The patient  reports that  has  never smoked. she has never used smokeless tobacco. She reports that she does not drink alcohol or use drugs.   Family History:   family history includes Breast cancer (age of onset: 33) in her sister; Cancer in her maternal grandfather and mother; Cancer (age of onset: 61) in her brother; Diabetes in her brother, father, mother, and sister; Heart disease in her brother, father, and mother; Heart failure in her brother and mother; Hypertension in her father and mother; Liver disease in her father.    Review of Systems: Review of Systems  Constitutional:  Negative.   Respiratory: Negative.   Cardiovascular: Negative.   Gastrointestinal: Negative.   Musculoskeletal: Positive for back pain.  Neurological: Negative.   Psychiatric/Behavioral: Negative.   All other systems reviewed and are negative.    PHYSICAL EXAM: VS:  There were no vitals taken for this visit. , BMI There is no height or weight on file to calculate BMI. GEN: Well nourished, well developed, in no acute distress HEENT: normal Neck: no JVD, carotid bruits, or masses Cardiac: RRR; no murmurs, rubs, or gallops,no edema  Respiratory:  clear to auscultation bilaterally, normal work of breathing GI: soft, nontender, nondistended, + BS MS: no deformity or atrophy Skin: warm and dry, no rash Neuro:  Strength and sensation are intact Psych: euthymic mood, full affect    Recent Labs: 02/03/2017: ALT 14; TSH 2.05 07/04/2017: Hemoglobin 14.4; Platelets 306.0 08/23/2017: BUN 13; Creatinine, Ser 0.79; Potassium 4.5; Sodium 146    Lipid Panel Lab Results  Component Value Date   CHOL 164 03/04/2016   HDL 63.50 03/04/2016   LDLCALC 75 03/04/2016   TRIG 129.0 03/04/2016      Wt Readings from Last 3 Encounters:  08/26/17 200 lb (90.7 kg)  08/23/17 200 lb (90.7 kg)  04/22/17 218 lb (98.9 kg)       ASSESSMENT AND PLAN:  HYPERTENSION, BENIGN - Plan: EKG 12-Lead Blood pressure is well controlled on today's visit. No changes made to the medications.  Hyperlipidemia Cholesterol is at goal on the current lipid regimen. No changes to the medications were made. Simvastatin renewed  HYPOKALEMIA Encouraged her to stay on her Aldactone   Obesity After back surgery, recommended a regular exercise regimen, dietary changes for weight loss  Back pain, unspecified location Acceptable risk for upcoming back surgery, no significant active cardiac issues at this time No further testing needed   Total encounter time more than 15 minutes  Greater than 50% was spent in  counseling and coordination of care with the patient    Disposition:   F/U  12 months   No orders of the defined types were placed in this encounter.    Signed, Esmond Plants, M.D., Ph.D. 10/01/2017  [place], [place]

## 2017-10-04 ENCOUNTER — Ambulatory Visit: Payer: Medicare Other | Admitting: Cardiovascular Disease

## 2017-10-14 ENCOUNTER — Ambulatory Visit (INDEPENDENT_AMBULATORY_CARE_PROVIDER_SITE_OTHER): Payer: Medicare Other

## 2017-10-14 ENCOUNTER — Other Ambulatory Visit: Payer: Self-pay

## 2017-10-14 DIAGNOSIS — R0600 Dyspnea, unspecified: Secondary | ICD-10-CM

## 2017-10-14 DIAGNOSIS — R0609 Other forms of dyspnea: Secondary | ICD-10-CM

## 2017-10-15 NOTE — Progress Notes (Signed)
Patient ID: Katherine [name], female   DOB: [birth year], 67 y.o.   MRN: [id number] Cardiology Office Note  Date:  10/17/2017   ID:  Latrenda, Irani 05/15/1951, MRN [id number]  PCP:  [name], NP   Chief Complaint  Patient presents with  . Other    2 month follow up. Patient c/o Racing heart beats and SOB when excerting herself but that is nothing new. Meds reviewed verbally with patient.     HPI:  Katherine [name] is a 66 y/o woman with a h/o  CP with no CAD on cath 2007,  HTN  Morbid obesity Chronic back pain, prior back surgery Possible sleep apnea by history She returns today for routine follow-up Of her blood pressure, history of hypokalemia  18 pounds weight down Got depressed this winter meds did not work Hemp gummie before bed Does one sleeping pill, sleeps well  Racing heartbeat at nighttime Wonders if it could be from stress Continues to take carvedilol twice a day 12.5 mg  Previously seen in our office for hypertension, started on chlorthalidone  Recent lab work shows potassium 4.5 creatinine 0.79 glucose 123 Stable CBC Hemoglobin A1c 5.2 Previous total cholesterol 164 in August 2017  Studies from March 2019  echoand stress test reviewed with her in detail showed EF 65% no signifcant valvular disease.  No ischemia on stress   back surgery with Dr. 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  Previously started on spironolactone for low potassium  EKG personally reviewed by myself on todays visit Shows normal sinus rhythm rate 89 bpm no significant ST or T wave changes   PMH:   has a past medical history of Anemia (2018), Anxiety, Arthritis, Borderline diabetes, Chest pain, Chicken pox, Chronic back pain, Depression, Dyspnea on exertion, GERD (gastroesophageal reflux disease), High cholesterol, Hypertension, Hypokalemia, Migraine headache, Obesity, and Pre-diabetes.  PSH:     Past Surgical History:  Procedure Laterality Date  . BLADDER SUSPENSION  2005  . CARDIAC CATHETERIZATION    . CESAREAN SECTION    . ENDOMETRIAL ABLATION  2007  . FOOT SURGERY     bilateral bunions  . LUMBAR LAMINECTOMY/DECOMPRESSION MICRODISCECTOMY Bilateral 01/12/2016   Procedure: Bilateral L4-5 Laminotomy/Foraminotomy;  Surgeon: Newman Pies, MD;  Location: [facility];  Service: Neurosurgery;  Laterality: Bilateral;  Bilateral L4-5 Laminotomy/Foraminotomy  . TONSILLECTOMY  1971    Current Outpatient Medications  Medication Sig Dispense Refill  . carvedilol (COREG) 12.5 MG tablet TAKE 1 TABLET (12.5 MG TOTAL) BY MOUTH 2 (TWO) TIMES DAILY. 180 tablet 3  . cetirizine (ZYRTEC) 10 MG tablet Take 10 mg by mouth daily as needed for allergies.    . chlorthalidone (HYGROTON) 25 MG tablet Take 1 tablet (25 mg total) by mouth daily. 90 tablet 3  . DULoxetine (CYMBALTA) 30 MG capsule Take 30 mg by mouth daily.  0  . DULoxetine (CYMBALTA) 60 MG capsule Take 60 mg by mouth daily.   0  . pantoprazole (PROTONIX) 40 MG tablet TAKE 1 TABLET BY MOUTH EVERY DAY 30 tablet 5  . simvastatin (ZOCOR) 20 MG tablet TAKE 1 TABLET EVERY DAY 90 tablet 3  . spironolactone (ALDACTONE) 25 MG tablet TAKE 1 TABLET BY MOUTH TWICE A DAY 60 tablet 3  . topiramate (TOPAMAX) 100 MG tablet Take 100 mg by mouth daily.   1   No current facility-administered medications for this visit.  Allergies:   Codeine; Darvon; Hydrocodone; Meperidine; Oxycodone; Tramadol; and Victoza [liraglutide]   Social History:  The patient  reports that she has never smoked. She has never used smokeless tobacco. She reports that she does not drink alcohol or use drugs.   Family History:   family history includes Breast cancer (age of onset: 79) in her sister; Cancer in her maternal grandfather and mother; Cancer (age of onset: 65) in her brother; Diabetes in her brother, father, mother, and sister; Heart disease in her brother, father,  and mother; Heart failure in her brother and mother; Hypertension in her father and mother; Liver disease in her father.    Review of Systems: Review of Systems  Constitutional: Negative.   Respiratory: Negative.   Cardiovascular: Negative.   Gastrointestinal: Negative.   Musculoskeletal: Positive for back pain.  Neurological: Negative.   Psychiatric/Behavioral: Negative.   All other systems reviewed and are negative.    PHYSICAL EXAM: VS:  BP 138/80 (BP Location: Left Arm, Patient Position: Sitting, Cuff Size: Normal)   Pulse 89   Ht 5\' 3"  (1.6 m)   Wt 192 lb 12 oz (87.4 kg)   BMI 34.14 kg/m  , BMI Body mass index is 34.14 kg/m. Constitutional:  oriented to person, place, and time. No distress. Obese HENT:  Head: Normocephalic and atraumatic.  Eyes:  no discharge. No scleral icterus.  Neck: Normal range of motion. Neck supple. No JVD present.  Cardiovascular: Normal rate, regular rhythm, normal heart sounds and intact distal pulses. Exam reveals no gallop and no friction rub. No edema No murmur heard. Pulmonary/Chest: Effort normal and breath sounds normal. No stridor. No respiratory distress.  no wheezes.  no rales.  no tenderness.  Abdominal: Soft.  no distension.  no tenderness.  Musculoskeletal: Normal range of motion.  no  tenderness or deformity.  Neurological:  normal muscle tone. Coordination normal. No atrophy Skin: Skin is warm and dry. No rash noted. not diaphoretic.  Psychiatric:  normal mood and affect. behavior is normal. Thought content normal.    Recent Labs: 02/03/2017: ALT 14; TSH 2.05 07/04/2017: Hemoglobin 14.4; Platelets 306.0 08/23/2017: BUN 13; Creatinine, Ser 0.79; Potassium 4.5; Sodium 146    Lipid Panel Lab Results  Component Value Date   CHOL 164 03/04/2016   HDL 63.50 03/04/2016   LDLCALC 75 03/04/2016   TRIG 129.0 03/04/2016      Wt Readings from Last 3 Encounters:  10/17/17 192 lb 12 oz (87.4 kg)  08/26/17 200 lb (90.7 kg)   08/23/17 200 lb (90.7 kg)     ASSESSMENT AND PLAN:  HYPERTENSION, BENIGN - Plan: EKG 12-Lead Blood pressure is well controlled on today's visit. No changes made to the medications. Stable We will check basic metabolic panel with her on chlorthalidone  Hyperlipidemia Weight is down significantly over the past 6 months 218 down to 192 pounds Lab work with primary care  HYPOKALEMIA  stay on her Aldactone Was started on chlorthalidone by our clinic in the past Will check BMP at her convenience  Obesity After back surgery, recommended a regular exercise regimen, dietary changes for weight loss  Back pain, unspecified location Had back surgery Feels it is working well, no regular exercise program   Total encounter time more than 25 minutes  Greater than 50% was spent in counseling and coordination of care with the patient  Disposition:   F/U  12 months   Orders Placed This Encounter  Procedures  . EKG 12-Lead  Signed, Esmond Plants, M.D., Ph.D. 10/17/2017  [place], [place]

## 2017-10-17 ENCOUNTER — Encounter: Payer: Self-pay | Admitting: Cardiovascular Disease

## 2017-10-17 ENCOUNTER — Ambulatory Visit: Payer: Medicare Other | Admitting: Cardiovascular Disease

## 2017-10-17 VITALS — BP 138/80 | HR 89 | Ht 63.0 in | Wt 192.8 lb

## 2017-10-17 DIAGNOSIS — I10 Essential (primary) hypertension: Secondary | ICD-10-CM | POA: Diagnosis not present

## 2017-10-17 DIAGNOSIS — G4733 Obstructive sleep apnea (adult) (pediatric): Secondary | ICD-10-CM | POA: Diagnosis not present

## 2017-10-17 DIAGNOSIS — R079 Chest pain, unspecified: Secondary | ICD-10-CM

## 2017-10-17 DIAGNOSIS — E782 Mixed hyperlipidemia: Secondary | ICD-10-CM | POA: Diagnosis not present

## 2017-10-17 DIAGNOSIS — R7303 Prediabetes: Secondary | ICD-10-CM | POA: Diagnosis not present

## 2017-10-17 NOTE — Patient Instructions (Signed)
Medication Instructions:   No medication changes made  Labwork:  BMP at your convenience  Testing/Procedures:  No further testing at this time   Follow-Up: It was a pleasure seeing you in the office today. Please call us if you have new issues that need to be addressed before your next appt.  [phone]  Your physician wants you to follow-up in: 12 months.  You will receive a reminder letter in the mail two months in advance. If you don't receive a letter, please call our office to schedule the follow-up appointment.  If you need a refill on your cardiac medications before your next appointment, please call your pharmacy.  For educational health videos Log in to : [email or web address] Or : [email or web address], password : [email or web address]

## 2017-10-20 ENCOUNTER — Other Ambulatory Visit: Payer: Medicare Other

## 2017-12-05 ENCOUNTER — Other Ambulatory Visit: Payer: Self-pay | Admitting: Cardiovascular Disease

## 2017-12-22 ENCOUNTER — Other Ambulatory Visit: Payer: Self-pay | Admitting: Primary Care

## 2017-12-22 DIAGNOSIS — M7989 Other specified soft tissue disorders: Secondary | ICD-10-CM

## 2017-12-28 ENCOUNTER — Other Ambulatory Visit: Payer: Medicare Other

## 2018-01-05 ENCOUNTER — Other Ambulatory Visit (INDEPENDENT_AMBULATORY_CARE_PROVIDER_SITE_OTHER): Payer: Medicare Other

## 2018-01-05 DIAGNOSIS — R079 Chest pain, unspecified: Secondary | ICD-10-CM

## 2018-01-05 DIAGNOSIS — I10 Essential (primary) hypertension: Secondary | ICD-10-CM | POA: Diagnosis not present

## 2018-01-05 DIAGNOSIS — E782 Mixed hyperlipidemia: Secondary | ICD-10-CM

## 2018-01-06 ENCOUNTER — Telehealth: Payer: Self-pay | Admitting: *Deleted

## 2018-01-06 DIAGNOSIS — I10 Essential (primary) hypertension: Secondary | ICD-10-CM

## 2018-01-06 LAB — BASIC METABOLIC PANEL
BUN/Creatinine Ratio: 20 (ref 12–28)
BUN: 17 mg/dL (ref 8–27)
CALCIUM: 9.6 mg/dL (ref 8.7–10.3)
CHLORIDE: 102 mmol/L (ref 96–106)
CO2: 24 mmol/L (ref 20–29)
Creatinine, Ser: 0.83 mg/dL (ref 0.57–1.00)
GFR calc Af Amer: 85 mL/min/{1.73_m2} (ref >59)
GFR calc non Af Amer: 74 mL/min/{1.73_m2} (ref >59)
GLUCOSE: 115 mg/dL — AB (ref 65–99)
Potassium: 3 mmol/L — ABNORMAL LOW (ref 3.5–5.2)
Sodium: 142 mmol/L (ref 134–144)

## 2018-01-06 MED ORDER — POTASSIUM CHLORIDE ER 10 MEQ PO TBCR: 10.0000 meq | EXTENDED_RELEASE_TABLET | Freq: Every day | ORAL | 30 refills | Status: DC

## 2018-01-06 NOTE — Telephone Encounter (Signed)
-----   Message from Minna Merritts, MD sent at 01/06/2018  5:44 PM EDT ----- Stable renal function with low potassium Chlorthalidone dropping her potassium Would recommend 20 mEq daily 1 week then down to 10 mEq daily Could recheck in one month

## 2018-01-06 NOTE — Telephone Encounter (Signed)
Spoke with patient and reviewed results and recommendations. She verbalized understanding and will call back on Monday to schedule appointment.

## 2018-01-12 ENCOUNTER — Other Ambulatory Visit: Payer: Self-pay | Admitting: Cardiovascular Disease

## 2018-01-18 ENCOUNTER — Ambulatory Visit (INDEPENDENT_AMBULATORY_CARE_PROVIDER_SITE_OTHER): Payer: Medicare Other | Admitting: Obstetrics & Gynecology

## 2018-01-18 ENCOUNTER — Encounter: Payer: Self-pay | Admitting: Obstetrics & Gynecology

## 2018-01-18 VITALS — BP 108/68 | HR 72 | Wt 184.8 lb

## 2018-01-18 DIAGNOSIS — Z01419 Encounter for gynecological examination (general) (routine) without abnormal findings: Secondary | ICD-10-CM

## 2018-01-18 NOTE — Progress Notes (Signed)
MAMMOGRAM 03/2018

## 2018-01-18 NOTE — Progress Notes (Signed)
Subjective:    Katherine [name] is a 67 y.o. married P1 (34 yo son, 2 grands) female who presents for an annual exam. The patient has no complaints today. The patient is sexually active. GYN screening history: last pap: was normal. The patient wears seatbelts: yes. The patient participates in regular exercise: yes. ( walking) Has the patient ever been transfused or tattooed?: no. The patient reports that there is not domestic violence in her life.   Menstrual History: OB History   None     Menarche age: 40 No LMP recorded. Patient is postmenopausal.    The following portions of the patient's history were reviewed and updated as appropriate: allergies, current medications, past family history, past medical history, past social history, past surgical history and problem list.  Review of Systems Pertinent items are noted in HPI.   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 (q 6 months) + breast cancer in sister, + cervical cancer in mom, + lung and throat cancer in her brother Lost 30# by changing her diet Sees a psychiatrist in [place]- doing well    Objective:    BP 108/68   Pulse 72   Wt 184 lb 12.8 oz (83.8 kg)   BMI 32.74 kg/m   General Appearance:    Alert, cooperative, no distress, appears stated age  Head:    Normocephalic, without obvious abnormality, atraumatic  Eyes:    PERRL, conjunctiva/corneas clear, EOM's intact, fundi    benign, both eyes  Ears:    Normal TM's and external ear canals, both ears  Nose:   Nares normal, septum midline, mucosa normal, no drainage    or sinus tenderness  Throat:   Lips, mucosa, and tongue normal; teeth and gums normal  Neck:   Supple, symmetrical, trachea midline, no adenopathy;    thyroid:  no enlargement/tenderness/nodules; no carotid   bruit or JVD  Back:     Symmetric, no curvature, ROM normal, no CVA tenderness  Lungs:     Clear to auscultation bilaterally, respirations  unlabored  Chest Wall:    No tenderness or deformity   Heart:    Regular rate and rhythm, S1 and S2 normal, no murmur, rub   or gallop  Breast Exam:    No tenderness, masses, or nipple abnormality  Abdomen:     Soft, non-tender, bowel sounds active all four quadrants,    no masses, no organomegaly  Genitalia:    Normal female without lesion, discharge or tenderness, no masses or tenderness with bimanual exam     Extremities:   Extremities normal, atraumatic, no cyanosis or edema  Pulses:   2+ and symmetric all extremities  Skin:   Skin color, texture, turgor normal, no rashes or lesions  Lymph nodes:   Cervical, supraclavicular, and axillary nodes normal  Neurologic:   CNII-XII intact, normal strength, sensation and reflexes    throughout  .    Assessment:    Healthy female exam.    Plan:     Discussed healthy lifestyle modifications. Mammogram.

## 2018-01-23 ENCOUNTER — Telehealth: Payer: Self-pay

## 2018-01-23 NOTE — Telephone Encounter (Signed)
Patient forgot to talk to you about bone density scan that was completed a couple years ago. She stated she has the beginning  stages of Osteoporosis it looks like her last one was in 2017. Do you want to send her for a recent one? Please Advise

## 2018-01-26 ENCOUNTER — Other Ambulatory Visit: Payer: Self-pay

## 2018-01-26 DIAGNOSIS — Z0000 Encounter for general adult medical examination without abnormal findings: Secondary | ICD-10-CM

## 2018-01-26 NOTE — Telephone Encounter (Signed)
Patient would like to have another bone density test completed. Per Dr.Dove ok to order.

## 2018-01-30 ENCOUNTER — Telehealth: Payer: Self-pay

## 2018-01-30 DIAGNOSIS — E2839 Other primary ovarian failure: Secondary | ICD-10-CM

## 2018-01-30 NOTE — Telephone Encounter (Signed)
Order place for patient to have a repeat bone density  Scan.

## 2018-02-01 ENCOUNTER — Other Ambulatory Visit (INDEPENDENT_AMBULATORY_CARE_PROVIDER_SITE_OTHER): Payer: Medicare Other

## 2018-02-01 DIAGNOSIS — I10 Essential (primary) hypertension: Secondary | ICD-10-CM | POA: Diagnosis not present

## 2018-02-02 LAB — BASIC METABOLIC PANEL
BUN / CREAT RATIO: 19 (ref 12–28)
BUN: 16 mg/dL (ref 8–27)
CALCIUM: 9.2 mg/dL (ref 8.7–10.3)
CHLORIDE: 100 mmol/L (ref 96–106)
CO2: 22 mmol/L (ref 20–29)
CREATININE: 0.84 mg/dL (ref 0.57–1.00)
GFR calc non Af Amer: 73 mL/min/{1.73_m2} (ref >59)
GFR, EST AFRICAN AMERICAN: 84 mL/min/{1.73_m2} (ref >59)
Glucose: 126 mg/dL — ABNORMAL HIGH (ref 65–99)
Potassium: 3.1 mmol/L — ABNORMAL LOW (ref 3.5–5.2)
Sodium: 140 mmol/L (ref 134–144)

## 2018-02-06 ENCOUNTER — Telehealth: Payer: Self-pay | Admitting: *Deleted

## 2018-02-06 DIAGNOSIS — E782 Mixed hyperlipidemia: Secondary | ICD-10-CM

## 2018-02-06 DIAGNOSIS — R079 Chest pain, unspecified: Secondary | ICD-10-CM

## 2018-02-06 MED ORDER — POTASSIUM CHLORIDE ER 10 MEQ PO TBCR: 20.0000 meq | EXTENDED_RELEASE_TABLET | Freq: Every day | ORAL | 3 refills | Status: DC

## 2018-02-06 NOTE — Telephone Encounter (Signed)
-----   Message from Minna Merritts, MD sent at 02/04/2018 12:42 PM EDT ----- On the chlorthalidone her potassium is low both in June and in July We previously started potassium 20 milliequivalents daily for 1 week then down to 10 mEq daily If she is taking 10 daily we need to increase back to 20 daily If she is not on potassium she needs to start Normal 3.5 or higher she is 3.1 last 2 lab draws

## 2018-02-06 NOTE — Telephone Encounter (Signed)
Spoke with patient and she states that she was not taking the potassium daily. Instructed her to start taking Potassium 10 meq 2 tablets once daily and that I would send in a refill for her. She also inquired about repeat labs and will put in for repeat BMP in one month which she prefers to have done here. Advised that I would send updated prescription to her pharmacy and would also have scheduling call for repeat lab appointment. She was appreciative for the call with no further questions at this time.

## 2018-02-07 NOTE — Telephone Encounter (Signed)
Pt scheduled for 03/03/18   Nothing else needed

## 2018-02-09 ENCOUNTER — Telehealth: Payer: Self-pay | Admitting: Cardiovascular Disease

## 2018-02-09 NOTE — Telephone Encounter (Signed)
Pt calling stating the potassium we have her on is not being tolerated by her well.   She is very constipated   She is asking for another way to get the potassium in her  Would like advise on this  Please call back

## 2018-02-09 NOTE — Telephone Encounter (Signed)
Left a message to call back.

## 2018-02-10 ENCOUNTER — Other Ambulatory Visit: Payer: Self-pay | Admitting: Primary Care

## 2018-02-10 ENCOUNTER — Other Ambulatory Visit: Payer: Self-pay | Admitting: Cardiovascular Disease

## 2018-02-10 DIAGNOSIS — K219 Gastro-esophageal reflux disease without esophagitis: Secondary | ICD-10-CM

## 2018-02-10 NOTE — Telephone Encounter (Signed)
Patient returning call  Please call to discuss

## 2018-02-10 NOTE — Telephone Encounter (Signed)
Spoke with the patient. She stated that she restarted the potassium on Monday 02/06/18. She takes potassium 20 meq, 10 in the morning and 10 in the evening. Since she has restarted the potassium she has been having problems with constipation.   She stated that she has been trying laxatives without any success. She has been eating dried fruits and food with high potassium in an effort to increase her potassium level. She would like to know if she can discontinue the potassium supplement and come in early for a repeat BMET. She is currently scheduled to come in on 03/03/18.

## 2018-02-10 NOTE — Telephone Encounter (Signed)
Patient calling to check on status of Dr. Donivan Scull recommendations Please call to discuss

## 2018-02-12 NOTE — Telephone Encounter (Signed)
Chlorthalidone is contributing to her low potassium Would stop chlorthalidone Start losartan 50 mg daily  for BP Recheck potassium in a few weeks  Can eat bananas for potassium Research google for other foods

## 2018-02-13 MED ORDER — LOSARTAN POTASSIUM 50 MG PO TABS: 50.0000 mg | ORAL_TABLET | Freq: Every day | ORAL | 3 refills | Status: DC

## 2018-02-13 NOTE — Telephone Encounter (Signed)
Electronically refill request for pantoprazole (PROTONIX) 40 MG tablet  Last prescribed on 08/15/2017  Last office visit on 09/06/2017 with Dr Darnell Level for an acute. Patient has not have a CPE since 02/2016

## 2018-02-13 NOTE — Telephone Encounter (Signed)
We can hold the potassium pill

## 2018-02-13 NOTE — Telephone Encounter (Signed)
Please notify patient that she's overdue for CPE and will need to be seen before we can refill any more medications. I'll send in a 30 day supply of her pantoprazole for now.

## 2018-02-13 NOTE — Telephone Encounter (Signed)
Patient verbalized understanding to stop the chlorthalidone and start losartan 50 mg daily. She has already researched potassium rich foods and will be sure to eat those. Rx sent to pharmacy. She is scheduled for lab work in the office on 03/03/18 and is aware of appointment.  Routing to Dr Rockey Situ to make sure it is ok to remove Potassium supplement from patient's list.

## 2018-02-14 NOTE — Telephone Encounter (Signed)
No answer. Left message to call back.  Potassium removed from list.

## 2018-02-14 NOTE — Telephone Encounter (Signed)
Patient called back and verbalized understanding she could stop the potassium.

## 2018-02-15 NOTE — Telephone Encounter (Signed)
Message left for patient to return my call.  

## 2018-03-01 NOTE — Telephone Encounter (Signed)
Sending letter

## 2018-03-02 ENCOUNTER — Other Ambulatory Visit: Payer: Self-pay | Admitting: Neurosurgery

## 2018-03-02 DIAGNOSIS — M48062 Spinal stenosis, lumbar region with neurogenic claudication: Secondary | ICD-10-CM

## 2018-03-02 DIAGNOSIS — G9519 Other vascular myelopathies: Secondary | ICD-10-CM

## 2018-03-03 ENCOUNTER — Other Ambulatory Visit (INDEPENDENT_AMBULATORY_CARE_PROVIDER_SITE_OTHER): Payer: Medicare Other | Admitting: *Deleted

## 2018-03-03 ENCOUNTER — Telehealth: Payer: Self-pay | Admitting: Nurse Practitioner

## 2018-03-03 DIAGNOSIS — R079 Chest pain, unspecified: Secondary | ICD-10-CM

## 2018-03-03 DIAGNOSIS — E782 Mixed hyperlipidemia: Secondary | ICD-10-CM

## 2018-03-03 NOTE — Telephone Encounter (Signed)
Phone call to patient to verify medication list and allergies for myelogram procedure. Pt instructed she will need to stop cymbalta 48hrs prior to myelogram procedure. Pt verbalized understanding.

## 2018-03-04 LAB — BASIC METABOLIC PANEL
BUN/Creatinine Ratio: 16 (ref 12–28)
BUN: 13 mg/dL (ref 8–27)
CALCIUM: 9.2 mg/dL (ref 8.7–10.3)
CHLORIDE: 109 mmol/L — AB (ref 96–106)
CO2: 18 mmol/L — ABNORMAL LOW (ref 20–29)
Creatinine, Ser: 0.79 mg/dL (ref 0.57–1.00)
GFR calc non Af Amer: 78 mL/min/{1.73_m2} (ref >59)
GFR, EST AFRICAN AMERICAN: 90 mL/min/{1.73_m2} (ref >59)
GLUCOSE: 91 mg/dL (ref 65–99)
POTASSIUM: 4.2 mmol/L (ref 3.5–5.2)
Sodium: 142 mmol/L (ref 134–144)

## 2018-03-11 ENCOUNTER — Other Ambulatory Visit: Payer: Self-pay | Admitting: Primary Care

## 2018-03-11 DIAGNOSIS — K219 Gastro-esophageal reflux disease without esophagitis: Secondary | ICD-10-CM

## 2018-03-13 ENCOUNTER — Encounter: Payer: Self-pay | Admitting: Primary Care

## 2018-03-13 ENCOUNTER — Ambulatory Visit: Payer: Self-pay

## 2018-03-13 ENCOUNTER — Telehealth: Payer: Self-pay | Admitting: Primary Care

## 2018-03-13 ENCOUNTER — Ambulatory Visit: Payer: Medicare Other | Admitting: Primary Care

## 2018-03-13 VITALS — BP 104/70 | HR 91 | Temp 98.1°F | Ht 63.0 in | Wt 185.5 lb

## 2018-03-13 DIAGNOSIS — E559 Vitamin D deficiency, unspecified: Secondary | ICD-10-CM

## 2018-03-13 DIAGNOSIS — I10 Essential (primary) hypertension: Secondary | ICD-10-CM

## 2018-03-13 DIAGNOSIS — D649 Anemia, unspecified: Secondary | ICD-10-CM

## 2018-03-13 DIAGNOSIS — G43709 Chronic migraine without aura, not intractable, without status migrainosus: Secondary | ICD-10-CM | POA: Diagnosis not present

## 2018-03-13 DIAGNOSIS — R5383 Other fatigue: Secondary | ICD-10-CM

## 2018-03-13 DIAGNOSIS — K219 Gastro-esophageal reflux disease without esophagitis: Secondary | ICD-10-CM | POA: Diagnosis not present

## 2018-03-13 DIAGNOSIS — F329 Major depressive disorder, single episode, unspecified: Secondary | ICD-10-CM

## 2018-03-13 DIAGNOSIS — F32A Depression, unspecified: Secondary | ICD-10-CM

## 2018-03-13 DIAGNOSIS — M48062 Spinal stenosis, lumbar region with neurogenic claudication: Secondary | ICD-10-CM

## 2018-03-13 MED ORDER — TOPIRAMATE 100 MG PO TABS: 100.0000 mg | ORAL_TABLET | Freq: Every day | ORAL | 3 refills | Status: DC

## 2018-03-13 MED ORDER — PANTOPRAZOLE SODIUM 40 MG PO TBEC: 40.0000 mg | DELAYED_RELEASE_TABLET | Freq: Every day | ORAL | 3 refills | Status: DC

## 2018-03-13 NOTE — Telephone Encounter (Signed)
Rx has already been pended for provider review 

## 2018-03-13 NOTE — Assessment & Plan Note (Signed)
Repeat Vitamin D pending. 

## 2018-03-13 NOTE — Assessment & Plan Note (Signed)
Managed on Topamax 100 mg HS and doing well. Continue same. Refill sent to pharmacy.

## 2018-03-13 NOTE — Patient Instructions (Signed)
Stop by the lab prior to leaving today. I will notify you of your results once received.   Continue working on walking to help reduce fatigue.   Make sure to work on your diet, congratulations on your weight loss.  I sent refills to your pharmacy.  It was a pleasure to see you today!

## 2018-03-13 NOTE — Assessment & Plan Note (Signed)
Following with psychiatry, overall feels well managed.

## 2018-03-13 NOTE — Telephone Encounter (Signed)
Unable to reach pt to see if she wants to see Gentry Fitz NP today at 3:40. FYI to Gentry Fitz NP and Vallarie Mare CMA.

## 2018-03-13 NOTE — Assessment & Plan Note (Signed)
Will undergo myelogram within the next week for return of left lower back pain with numbness to left lower extremity. No alarm signs.

## 2018-03-13 NOTE — Telephone Encounter (Signed)
Left message for patient to see if we can schedule her in today.

## 2018-03-13 NOTE — Telephone Encounter (Signed)
Copied from [place] [id number]. Topic: Quick Communication - Rx Refill/Question >> Mar 13, 2018  9:33 AM Selinda Flavin B, NT wrote: Medication: pantoprazole (PROTONIX) 40 MG tablet  Has the patient contacted their pharmacy? Yes.   (Agent: If no, request that the patient contact the pharmacy for the refill.) (Agent: If yes, when and what did the pharmacy advise?)  Preferred Pharmacy (with phone number or street name): [id number] [name], [place]: Please be advised that RX refills may take up to 3 business days. We ask that you follow-up with your pharmacy.

## 2018-03-13 NOTE — Assessment & Plan Note (Addendum)
Chronic for years, seems to wax and wane over time. Differentials include chronic fatigue, anemia, thyroid disease, depression, side effects from Valium and or Vraylar. Also consider OSA, didn't discuss this during visit but will address via My Chart. She has undergone recent cardiac testing earlier this year per her cardiologist.   Check CBC and iron studies given history of anemia. Also check TSH, Vitamin B 12 and D.   Recommended regular activity through walking to increase energy levels. Also encouraged her to continue to work on her diet, commended her on weight loss.   Await results.

## 2018-03-13 NOTE — Telephone Encounter (Signed)
Pt. Reports she has noticed dizziness and weakness x 2-3 weeks. States she did start Vraylar "about that time, but I don't think it is related." Reports good results with Vraylar. States dizziness is worse with changing positions.Reports she has a history of low iron and was hospitalized for this in the past. Appointment made with her provider for tomorrow. Instructed if she feels worse to go to ED. Verbalizes understanding.  Reason for Disposition . [1] MODERATE dizziness (e.g., interferes with normal activities) AND [2] has NOT been evaluated by physician for this  (Exception: dizziness caused by heat exposure, sudden standing, or poor fluid intake)  Answer Assessment - Initial Assessment Questions 1. DESCRIPTION: "Describe your dizziness."     Dizzy 2. LIGHTHEADED: "Do you feel lightheaded?" (e.g., somewhat faint, woozy, weak upon standing)     When changes position gets dizzy 3. VERTIGO: "Do you feel like either you or the room is spinning or tilting?" (i.e. vertigo)     No 4. SEVERITY: "How bad is it?"  "Do you feel like you are going to faint?" "Can you stand and walk?"   - MILD - walking normally   - MODERATE - interferes with normal activities (e.g., work, school)    - SEVERE - unable to stand, requires support to walk, feels like passing out now.      Mild 5. ONSET:  "When did the dizziness begin?"     2-3 weeks 6. AGGRAVATING FACTORS: "Does anything make it worse?" (e.g., standing, change in head position)     Changing position 7. HEART RATE: "Can you tell me your heart rate?" "How many beats in 15 seconds?"  (Note: not all patients can do this)       No 8. CAUSE: "What do you think is causing the dizziness?"     Unsure 9. RECURRENT SYMPTOM: "Have you had dizziness before?" If so, ask: "When was the last time?" "What happened that time?"     No 10. OTHER SYMPTOMS: "Do you have any other symptoms?" (e.g., fever, chest pain, vomiting, diarrhea, bleeding)       Weakness,  unsteady 11. PREGNANCY: "Is there any chance you are pregnant?" "When was your last menstrual period?"       No  Protocols used: DIZZINESS [facility]

## 2018-03-13 NOTE — Assessment & Plan Note (Signed)
Doing well on pantoprazole, continue same. Refill sent to pharmacy.

## 2018-03-13 NOTE — Assessment & Plan Note (Signed)
Stable, on the lower end of normal. Encouraged her to change positions slowly to avoid orthostatic hypotension.

## 2018-03-13 NOTE — Assessment & Plan Note (Signed)
Repeat CBC and iron studies pending. 

## 2018-03-13 NOTE — Progress Notes (Signed)
Subjective:    Patient ID: Katherine [name], female    DOB: 1950-12-02, 67 y.o.   MRN: [id number]  HPI  Katherine [name] is a 67 year old female who presents today with multiple complaints. Is also needing medication refills.  She's not feeling well, has no energy, feels unsteady when walking, has noticed a change in her fine motor skills when writing, doesn't feel like she can get enough sleep, feels dizzy intermittently. She feels "like I'm drunk when I'm walking". She also reports dizziness and will have to hang onto objects when walking to the bathroom.   Symptoms began several months ago, worse over the last one month. Her husband believes she's had these issues since her back surgery 1.5 years ago. She will be undergoing myelogram by her neurosurgeon soon due to left upper extremity numbness and left lower back pain. She's trying to walk more but feels as though she has no energy to do so.   She is needing a refill of her topiramate 100 mg for which she takes at bedtime for migraine prevention. She's had no migraine in years, doing well on topiramate. She is also needing refills of her pantoprazole for GERD. Overall feels well managed on both medications.   She is working with her psychiatrist on sleep and mood. Recently started Vraylar and Valium and finds she's able to sleep better.   She denies chest pain, shortness of breath, changes in speech, vaginal or rectal bleeding. She underwent nuclear stress test and echocardiogram in Spring 2019.   Review of Systems  Constitutional: Positive for fatigue.  Eyes: Negative for visual disturbance.  Respiratory: Negative for shortness of breath.   Cardiovascular: Negative for chest pain.  Gastrointestinal: Negative for rectal pain.  Genitourinary: Negative for vaginal bleeding.  Skin: Negative for color change.  Neurological: Positive for dizziness. Negative for weakness and headaches.  Psychiatric/Behavioral: Positive for sleep disturbance.        Past Medical History:  Diagnosis Date  . Anemia 2018  . Anxiety   . Arthritis   . Borderline diabetes   . Chest pain    a. 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 Chicken pox    age 49  . Chronic back pain    Compressed discs. [place]  . Depression   . Dyspnea on exertion    a. 03/2009 Echo: EF 65%, no rwma, triv TR.  Marland Kitchen GERD (gastroesophageal reflux disease)   . High cholesterol   . Hypertension   . Hypokalemia   . Migraine headache   . Obesity   . Pre-diabetes    okay now      Social History   Socioeconomic History  . Marital status: Married    Spouse name: Not on file  . Number of children: Not on file  . Years of education: Not on file  . Highest education level: Not on file  Occupational History  . Occupation: Product manager: [employer]  Social Needs  . Financial resource strain: Not on file  . Food insecurity:    Worry: Not on file    Inability: Not on file  . Transportation needs:    Medical: Not on file    Non-medical: Not on file  Tobacco Use  . Smoking status: Never Smoker  . Smokeless tobacco: Never Used  Substance and Sexual Activity  . Alcohol use: No  . Drug  use: No  . Sexual activity: Yes    Partners: Male    Birth control/protection: Post-menopausal  Lifestyle  . Physical activity:    Days per week: Not on file    Minutes per session: Not on file  . Stress: Not on file  Relationships  . Social connections:    Talks on phone: Not on file    Gets together: Not on file    Attends religious service: Not on file    Active member of club or organization: Not on file    Attends meetings of clubs or organizations: Not on file    Relationship status: Not on file  . Intimate partner violence:    Fear of current or ex partner: Not on file    Emotionally abused: Not on file    Physically abused: Not on file    Forced  sexual activity: Not on file  Other Topics Concern  . Not on file  Social History Narrative   Married.   One son is 71, lives in [place].   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   Enjoys substitute teaching, gardening, cooking.    Past Surgical History:  Procedure Laterality Date  . BLADDER SUSPENSION  2005  . CARDIAC CATHETERIZATION    . CESAREAN SECTION    . ENDOMETRIAL ABLATION  2007  . FOOT SURGERY     bilateral bunions  . LUMBAR LAMINECTOMY/DECOMPRESSION MICRODISCECTOMY Bilateral 01/12/2016   Procedure: Bilateral L4-5 Laminotomy/Foraminotomy;  Surgeon: Newman Pies, MD;  Location: [facility];  Service: Neurosurgery;  Laterality: Bilateral;  Bilateral L4-5 Laminotomy/Foraminotomy  . TONSILLECTOMY  1971    Family History  Problem Relation Age of Onset  . Heart disease Mother   . Hypertension Mother   . Heart failure Mother        CHF  . Diabetes Mother   . Cancer Mother        CERVICAL CANCER  . Heart disease Father   . Diabetes Father   . Liver disease Father   . Hypertension Father   . Diabetes Sister   . Breast cancer Sister 12  . Heart disease Brother   . Heart failure Brother        CHF  . Diabetes Brother   . Cancer Brother 59       THROAT AND NECK  . Cancer Maternal Grandfather   . Colon cancer Neg Hx   . Stomach cancer Neg Hx   . Rectal cancer Neg Hx     Allergies  Allergen Reactions  . Codeine Anxiety  . Darvon Anxiety  . Hydrocodone Anxiety  . Meperidine Anxiety  . Oxycodone Anxiety  . Tramadol Anxiety  . Victoza [Liraglutide] Nausea Only and Other (See Comments)    And weakness    Current Outpatient Medications on File Prior to Visit  Medication Sig Dispense Refill  . cariprazine (VRAYLAR) capsule Take 1.5 mg by mouth daily.     . carvedilol (COREG) 12.5 MG tablet TAKE 1 TABLET (12.5 MG TOTAL) BY MOUTH 2 (TWO) TIMES DAILY. 180 tablet 3  . cetirizine (ZYRTEC) 10 MG tablet Take 10 mg by mouth daily as needed for allergies.    . diazepam (VALIUM)  10 MG tablet Take 10 mg by mouth at bedtime.  2  . DULoxetine (CYMBALTA) 60 MG capsule Take 120 mg by mouth daily.   0  . losartan (COZAAR) 50 MG tablet Take 1 tablet (50 mg total) by mouth daily. 90 tablet 3  . simvastatin (ZOCOR) 20 MG  tablet TAKE 1 TABLET EVERY DAY 90 tablet 3  . baclofen (LIORESAL) 10 MG tablet TAKE 1 TABLET BY MOUTH TWICE A DAY AS NEEDED . LIMIT 1-2 TREATMENTS PER WEEK  0  . spironolactone (ALDACTONE) 25 MG tablet TAKE 1 TABLET BY MOUTH TWICE A DAY (Patient not taking: Reported on 03/13/2018) 60 tablet 3   No current facility-administered medications on file prior to visit.     BP 104/70   Pulse 91   Temp 98.1 F (36.7 C) (Oral)   Ht 5\' 3"  (1.6 m)   Wt 185 lb 8 oz (84.1 kg)   SpO2 98%   BMI 32.86 kg/m    Objective:   Physical Exam  Constitutional: She is oriented to person, place, and time. She appears well-nourished.  HENT:  Right Ear: Tympanic membrane and ear canal normal.  Left Ear: Tympanic membrane and ear canal normal.  Eyes: EOM are normal.  Neck: Neck supple.  Cardiovascular: Normal rate and regular rhythm.  Respiratory: Effort normal and breath sounds normal.  Neurological: She is alert and oriented to person, place, and time.  Skin: Skin is warm and dry.  Psychiatric: She has a normal mood and affect.           Assessment & Plan:

## 2018-03-14 ENCOUNTER — Other Ambulatory Visit: Payer: Self-pay | Admitting: Primary Care

## 2018-03-14 ENCOUNTER — Ambulatory Visit: Payer: Medicare Other | Admitting: Primary Care

## 2018-03-14 DIAGNOSIS — E538 Deficiency of other specified B group vitamins: Secondary | ICD-10-CM

## 2018-03-14 DIAGNOSIS — E559 Vitamin D deficiency, unspecified: Secondary | ICD-10-CM

## 2018-03-14 LAB — VITAMIN D 25 HYDROXY (VIT D DEFICIENCY, FRACTURES): VITD: 23.35 ng/mL — ABNORMAL LOW (ref 30.00–100.00)

## 2018-03-14 LAB — CBC
HEMATOCRIT: 43.1 % (ref 36.0–46.0)
HEMOGLOBIN: 14 g/dL (ref 12.0–15.0)
MCHC: 32.4 g/dL (ref 30.0–36.0)
MCV: 93.4 fl (ref 78.0–100.0)
Platelets: 313 10*3/uL (ref 150.0–400.0)
RBC: 4.61 Mil/uL (ref 3.87–5.11)
RDW: 14.8 % (ref 11.5–15.5)
WBC: 8.2 10*3/uL (ref 4.0–10.5)

## 2018-03-14 LAB — VITAMIN B12: Vitamin B-12: 202 pg/mL — ABNORMAL LOW (ref 211–911)

## 2018-03-14 LAB — TSH: TSH: 1.93 u[IU]/mL (ref 0.35–4.50)

## 2018-03-14 LAB — FERRITIN: FERRITIN: 18.5 ng/mL (ref 10.0–291.0)

## 2018-03-14 LAB — IBC PANEL
IRON: 80 ug/dL (ref 42–145)
Saturation Ratios: 17.7 % — ABNORMAL LOW (ref 20.0–50.0)
TRANSFERRIN: 322 mg/dL (ref 212.0–360.0)

## 2018-03-16 ENCOUNTER — Other Ambulatory Visit: Payer: Medicare Other

## 2018-03-23 ENCOUNTER — Ambulatory Visit
Admission: RE | Admit: 2018-03-23 | Discharge: 2018-03-23 | Disposition: A | Discharge status: Home / Self Care | Payer: Medicare Other | Source: Ambulatory Visit | Attending: Neurosurgery | Admitting: Neurosurgery

## 2018-03-23 DIAGNOSIS — M48062 Spinal stenosis, lumbar region with neurogenic claudication: Secondary | ICD-10-CM

## 2018-03-23 DIAGNOSIS — G9519 Other vascular myelopathies: Secondary | ICD-10-CM

## 2018-03-23 MED ORDER — IOPAMIDOL (ISOVUE-M 200) INJECTION 41%
15.0000 mL | Freq: Once | INTRAMUSCULAR | Status: AC
  Administered 2018-03-23: 15 mL via INTRATHECAL

## 2018-03-23 MED ORDER — DIAZEPAM 5 MG PO TABS
5.0000 mg | ORAL_TABLET | Freq: Once | ORAL | Status: AC
  Administered 2018-03-23: 5 mg via ORAL

## 2018-03-23 NOTE — Discharge Instructions (Signed)

## 2018-03-29 ENCOUNTER — Ambulatory Visit: Payer: Medicare Other

## 2018-03-31 ENCOUNTER — Other Ambulatory Visit: Payer: Medicare Other

## 2018-04-04 ENCOUNTER — Other Ambulatory Visit: Payer: Self-pay | Admitting: Cardiovascular Disease

## 2018-04-05 ENCOUNTER — Ambulatory Visit (INDEPENDENT_AMBULATORY_CARE_PROVIDER_SITE_OTHER): Payer: Medicare Other

## 2018-04-05 DIAGNOSIS — E538 Deficiency of other specified B group vitamins: Secondary | ICD-10-CM

## 2018-04-05 MED ORDER — CYANOCOBALAMIN 1000 MCG/ML IJ SOLN
1000.0000 ug | Freq: Once | INTRAMUSCULAR | Status: AC
  Administered 2018-04-05: 1000 ug via INTRAMUSCULAR

## 2018-04-05 NOTE — Progress Notes (Signed)
Patient given first B12 injection today. Tolerated well.

## 2018-04-10 ENCOUNTER — Other Ambulatory Visit: Payer: Medicare Other

## 2018-05-03 ENCOUNTER — Other Ambulatory Visit: Payer: Self-pay | Admitting: Primary Care

## 2018-05-03 DIAGNOSIS — E782 Mixed hyperlipidemia: Secondary | ICD-10-CM

## 2018-05-10 ENCOUNTER — Other Ambulatory Visit (INDEPENDENT_AMBULATORY_CARE_PROVIDER_SITE_OTHER): Payer: Medicare Other

## 2018-05-10 ENCOUNTER — Ambulatory Visit (INDEPENDENT_AMBULATORY_CARE_PROVIDER_SITE_OTHER): Payer: Medicare Other | Admitting: *Deleted

## 2018-05-10 DIAGNOSIS — Z23 Encounter for immunization: Secondary | ICD-10-CM

## 2018-05-10 DIAGNOSIS — E782 Mixed hyperlipidemia: Secondary | ICD-10-CM | POA: Diagnosis not present

## 2018-05-10 DIAGNOSIS — E538 Deficiency of other specified B group vitamins: Secondary | ICD-10-CM

## 2018-05-10 DIAGNOSIS — E559 Vitamin D deficiency, unspecified: Secondary | ICD-10-CM | POA: Diagnosis not present

## 2018-05-10 LAB — LIPID PANEL
CHOL/HDL RATIO: 3
Cholesterol: 158 mg/dL (ref 0–200)
HDL: 48.5 mg/dL (ref >39.00)
NONHDL: 109.26
TRIGLYCERIDES: 216 mg/dL — AB (ref 0.0–149.0)
VLDL: 43.2 mg/dL — ABNORMAL HIGH (ref 0.0–40.0)

## 2018-05-10 LAB — VITAMIN D 25 HYDROXY (VIT D DEFICIENCY, FRACTURES): VITD: 29.78 ng/mL — ABNORMAL LOW (ref 30.00–100.00)

## 2018-05-10 LAB — HEPATIC FUNCTION PANEL
ALK PHOS: 90 U/L (ref 39–117)
ALT: 9 U/L (ref 0–35)
AST: 10 U/L (ref 0–37)
Albumin: 4 g/dL (ref 3.5–5.2)
BILIRUBIN DIRECT: 0 mg/dL (ref 0.0–0.3)
BILIRUBIN TOTAL: 0.2 mg/dL (ref 0.2–1.2)
TOTAL PROTEIN: 6.6 g/dL (ref 6.0–8.3)

## 2018-05-10 LAB — VITAMIN B12: Vitamin B-12: 316 pg/mL (ref 211–911)

## 2018-05-10 LAB — LDL CHOLESTEROL, DIRECT: Direct LDL: 92 mg/dL

## 2018-05-10 MED ORDER — CYANOCOBALAMIN 1000 MCG/ML IJ SOLN
1000.0000 ug | Freq: Once | INTRAMUSCULAR | Status: AC
  Administered 2018-05-10: 1000 ug via INTRAMUSCULAR

## 2018-05-10 NOTE — Progress Notes (Signed)
Per orders of Clark,NP, injection of b12 given by Ellieanna Funderburg H. Patient tolerated injection well.  

## 2018-05-15 ENCOUNTER — Encounter

## 2018-05-15 ENCOUNTER — Encounter: Payer: Medicare Other | Admitting: Primary Care

## 2018-05-15 DIAGNOSIS — Z0289 Encounter for other administrative examinations: Secondary | ICD-10-CM

## 2018-06-01 ENCOUNTER — Ambulatory Visit
Admission: RE | Admit: 2018-06-01 | Discharge: 2018-06-01 | Disposition: A | Discharge status: Home / Self Care | Payer: Medicare Other | Source: Ambulatory Visit | Attending: Primary Care | Admitting: Primary Care

## 2018-06-01 ENCOUNTER — Ambulatory Visit: Admission: RE | Admit: 2018-06-01 | Payer: Medicare Other | Source: Ambulatory Visit

## 2018-06-01 ENCOUNTER — Ambulatory Visit
Admission: RE | Admit: 2018-06-01 | Discharge: 2018-06-01 | Disposition: A | Discharge status: Home / Self Care | Payer: Medicare Other | Source: Ambulatory Visit | Attending: Obstetrics & Gynecology | Admitting: Obstetrics & Gynecology

## 2018-06-01 DIAGNOSIS — E2839 Other primary ovarian failure: Secondary | ICD-10-CM

## 2018-06-01 DIAGNOSIS — M7989 Other specified soft tissue disorders: Secondary | ICD-10-CM

## 2018-06-09 ENCOUNTER — Ambulatory Visit: Payer: Medicare Other | Admitting: Family Medicine

## 2018-06-09 ENCOUNTER — Other Ambulatory Visit: Payer: Self-pay | Admitting: Cardiovascular Disease

## 2018-06-13 ENCOUNTER — Ambulatory Visit: Payer: Medicare Other

## 2018-06-13 ENCOUNTER — Ambulatory Visit (INDEPENDENT_AMBULATORY_CARE_PROVIDER_SITE_OTHER): Payer: Medicare Other

## 2018-06-13 DIAGNOSIS — E538 Deficiency of other specified B group vitamins: Secondary | ICD-10-CM

## 2018-06-13 MED ORDER — CYANOCOBALAMIN 1000 MCG/ML IJ SOLN
1000.0000 ug | Freq: Once | INTRAMUSCULAR | Status: AC
  Administered 2018-06-13: 1000 ug via INTRAMUSCULAR

## 2018-06-13 NOTE — Progress Notes (Signed)
Pt given monthly B12 injection in Left Deltoid. Tolerated well. 

## 2018-06-14 ENCOUNTER — Ambulatory Visit: Payer: Medicare Other | Admitting: Family Medicine

## 2018-06-14 ENCOUNTER — Encounter: Payer: Self-pay | Admitting: Family Medicine

## 2018-06-14 VITALS — BP 110/78 | HR 86 | Temp 97.5°F | Ht 62.0 in | Wt 188.8 lb

## 2018-06-14 DIAGNOSIS — R350 Frequency of micturition: Secondary | ICD-10-CM | POA: Diagnosis not present

## 2018-06-14 DIAGNOSIS — N390 Urinary tract infection, site not specified: Secondary | ICD-10-CM

## 2018-06-14 DIAGNOSIS — R300 Dysuria: Secondary | ICD-10-CM

## 2018-06-14 LAB — POCT URINALYSIS DIPSTICK
BILIRUBIN UA: NEGATIVE
Glucose, UA: NEGATIVE
Ketones, UA: NEGATIVE
Nitrite, UA: NEGATIVE
PH UA: 6 (ref 5.0–8.0)
Protein, UA: NEGATIVE
RBC UA: NEGATIVE
UROBILINOGEN UA: 0.2 U/dL

## 2018-06-14 MED ORDER — SULFAMETHOXAZOLE-TRIMETHOPRIM 800-160 MG PO TABS: 1.0000 | ORAL_TABLET | Freq: Two times a day (BID) | ORAL | 0 refills | Status: DC

## 2018-06-14 NOTE — Progress Notes (Signed)
   Subjective:    Patient ID: Katherine [name], female    DOB: 1951-07-07, 67 y.o.   MRN: [id number]  HPI  Patient presents to clinic due to increased urinary frequency, dysuria and pressure x1 week. Patient does not get UTIs frequently, last UTI was more than 2 years ago  Denies nausea, vomiting or diarrhea. No fever or chills.   Patient Active Problem List   Diagnosis Date Noted  . Gastroesophageal reflux disease 03/13/2018  . Chronic migraine without aura without status migrainosus, not intractable 03/13/2018  . UTI (urinary tract infection) 08/26/2017  . Constipation 02/15/2017  . Spondylolisthesis of lumbar region 07/15/2016  . Vitamin D deficiency 03/11/2016  . Lumbar stenosis with neurogenic claudication 01/12/2016  . Notalgia 01/08/2016  . Restless leg syndrome 03/13/2015  . Preventative health care 12/10/2014  . Other fatigue 11/04/2014  . Anemia 11/04/2014  . Depression 10/31/2014  . Prediabetes 10/31/2014  . Obstructive sleep apnea 05/01/2009  . Hyperlipidemia 09/04/2008  . Obesity 09/04/2008  . HYPERTENSION, BENIGN 09/04/2008   Social History   Tobacco Use  . Smoking status: Never Smoker  . Smokeless tobacco: Never Used  Substance Use Topics  . Alcohol use: No   Review of Systems   Constitutional: Negative for chills, fatigue and fever.  HENT: Negative for congestion, ear pain, sinus pain and sore throat.   Eyes: Negative.   Respiratory: Negative for cough, shortness of breath and wheezing.   Cardiovascular: Negative for chest pain, palpitations and leg swelling.  Gastrointestinal: Negative for abdominal pain, diarrhea, nausea and vomiting.  Genitourinary: +dysuria, frequency and urgency.  Musculoskeletal: Negative for arthralgias and myalgias.  Skin: Negative for color change, pallor and rash.  Neurological: Negative for syncope, light-headedness and headaches.  Psychiatric/Behavioral: The patient is not nervous/anxious.       Objective:   Physical  Exam  Constitutional: She is oriented to person, place, and time. No distress.  HENT:  Head: Normocephalic and atraumatic.  Eyes: Conjunctivae and EOM are normal.  Neck: Neck supple.  Cardiovascular: Normal rate and regular rhythm.  Pulmonary/Chest: Effort normal and breath sounds normal. No respiratory distress.  Abdominal: Soft. Bowel sounds are normal. There is tenderness (Suprapubic).  Musculoskeletal: She exhibits no edema.  Gait normal  Neurological: She is alert and oriented to person, place, and time.  Skin: Skin is warm and dry.  Psychiatric: She has a normal mood and affect. Her behavior is normal.  Nursing note and vitals reviewed.     Vitals:   06/14/18 1127  BP: 110/78  Pulse: 86  Temp: (!) 97.5 F (36.4 C)  SpO2: 98%    Assessment & Plan:   UTI/urinary frequency/Dysuria - UA is + for leukocytes, this in combination with her symptoms we will treat for UTI. She will take bactrim BID for 5 days, increase fluid (water) intake, avoid excess sugar/caffeine, wear cotton underwear. Urine culture sent to lab.   Follow up with PCP as regularly scheduled, return to clinic sooner if issues arise or if current symptoms persist or worsen.

## 2018-06-14 NOTE — Patient Instructions (Signed)

## 2018-06-15 LAB — URINE CULTURE
MICRO NUMBER: 91430783
Result:: NO GROWTH
SPECIMEN QUALITY: ADEQUATE

## 2018-06-26 ENCOUNTER — Ambulatory Visit: Payer: Self-pay

## 2018-06-26 NOTE — Telephone Encounter (Addendum)
Please notify patient that her urine culture did not grow out bacteria which means there was likely no urinary tract infection to begin with.  She can try some over-the-counter AZO to see if this helps with symptoms.  Have her limit caffeine, spicy/irritating foods.  Sometimes the sensation of pressure and urgency can be from the bladder descending lower into the pelvic cavity, this could very well be the case.  I am happy to evaluate her sooner for her scheduled physical and the symptoms if she prefers.  If she would like to move her scheduled physical sooner so we can discuss both and I recommend she be schedule at the end of the session, morning or afternoon.

## 2018-06-26 NOTE — Telephone Encounter (Signed)
Message left for patient to return my call.  

## 2018-06-26 NOTE — Telephone Encounter (Signed)
Incoming call from Patient with complaint of urgency.  Patient states that she has completely taken all of her antibiotics  For the UTI.  Thinks she needs another (Bactrim Septra) round of antibiotic  to get over the urgency. Patient states she was on Bactrim Septra for 5 days a week.    Denies pain, denies burning,  Complains of pressure  and urgency.  Patient Pharmacy is [facility]  [address].   Reason for Disposition . [1] Reasonable improvement on antibiotics AND [2] no fever  Answer Assessment - Initial Assessment Questions 1. ANTIBIOTIC: "What antibiotic are you taking?" "How many times per day?"     *No Answer* 2. DURATION: "When was the antibiotic started?"     *No Answer* 3. MAIN SYMPTOM: "What is the main symptom you are concerned about?"     *No Answer* 4. FEVER: "Do you have a fever?" If so, ask: "What is it, how was it measured, and when did it start?"     *No Answer* 5. OTHER SYMPTOMS: "Do you have any other symptoms?" (e.g., flank pain, vaginal discharge, blood in urine)     *No Answer*  Protocols used: URINARY TRACT INFECTION ON ANTIBIOTIC FOLLOW-UP CALL - FEMALE-A-AH

## 2018-06-26 NOTE — Telephone Encounter (Signed)
Pt was seen by L Guse FNP on 06/14/18.Please advise.

## 2018-06-30 ENCOUNTER — Other Ambulatory Visit: Payer: Self-pay | Admitting: Primary Care

## 2018-06-30 DIAGNOSIS — D649 Anemia, unspecified: Secondary | ICD-10-CM

## 2018-06-30 DIAGNOSIS — I10 Essential (primary) hypertension: Secondary | ICD-10-CM

## 2018-06-30 DIAGNOSIS — E559 Vitamin D deficiency, unspecified: Secondary | ICD-10-CM

## 2018-06-30 DIAGNOSIS — R7303 Prediabetes: Secondary | ICD-10-CM

## 2018-06-30 DIAGNOSIS — Z1159 Encounter for screening for other viral diseases: Secondary | ICD-10-CM

## 2018-06-30 DIAGNOSIS — E782 Mixed hyperlipidemia: Secondary | ICD-10-CM

## 2018-06-30 NOTE — Telephone Encounter (Signed)
Message left for patient to return my call.  

## 2018-07-03 ENCOUNTER — Telehealth: Payer: Self-pay | Admitting: Primary Care

## 2018-07-03 NOTE — Telephone Encounter (Signed)
Pt returning your call from friday

## 2018-07-03 NOTE — Telephone Encounter (Signed)
Patient called back. Spoken and notified patient of Tawni Millers comments. Patient verbalized understanding.  Schedule appointment on 07/07/2018

## 2018-07-03 NOTE — Telephone Encounter (Signed)
This has been addressed in the original encounter on 06/26/2018

## 2018-07-06 ENCOUNTER — Ambulatory Visit (INDEPENDENT_AMBULATORY_CARE_PROVIDER_SITE_OTHER): Payer: Medicare Other

## 2018-07-06 VITALS — BP 110/70 | HR 96 | Temp 97.9°F | Ht 62.0 in | Wt 186.5 lb

## 2018-07-06 DIAGNOSIS — E559 Vitamin D deficiency, unspecified: Secondary | ICD-10-CM | POA: Diagnosis not present

## 2018-07-06 DIAGNOSIS — Z0000 Encounter for general adult medical examination without abnormal findings: Secondary | ICD-10-CM

## 2018-07-06 DIAGNOSIS — I10 Essential (primary) hypertension: Secondary | ICD-10-CM | POA: Diagnosis not present

## 2018-07-06 DIAGNOSIS — R7303 Prediabetes: Secondary | ICD-10-CM

## 2018-07-06 DIAGNOSIS — D649 Anemia, unspecified: Secondary | ICD-10-CM

## 2018-07-06 DIAGNOSIS — Z1159 Encounter for screening for other viral diseases: Secondary | ICD-10-CM | POA: Diagnosis not present

## 2018-07-06 DIAGNOSIS — E782 Mixed hyperlipidemia: Secondary | ICD-10-CM

## 2018-07-06 DIAGNOSIS — Z23 Encounter for immunization: Secondary | ICD-10-CM | POA: Diagnosis not present

## 2018-07-06 LAB — COMPREHENSIVE METABOLIC PANEL
ALBUMIN: 4.2 g/dL (ref 3.5–5.2)
ALK PHOS: 98 U/L (ref 39–117)
ALT: 9 U/L (ref 0–35)
AST: 12 U/L (ref 0–37)
BUN: 15 mg/dL (ref 6–23)
CO2: 23 mEq/L (ref 19–32)
Calcium: 9.3 mg/dL (ref 8.4–10.5)
Chloride: 110 mEq/L (ref 96–112)
Creatinine, Ser: 0.86 mg/dL (ref 0.40–1.20)
GFR: 70.01 mL/min (ref >60.00)
Glucose, Bld: 110 mg/dL — ABNORMAL HIGH (ref 70–99)
POTASSIUM: 3.8 meq/L (ref 3.5–5.1)
Sodium: 142 mEq/L (ref 135–145)
TOTAL PROTEIN: 7.1 g/dL (ref 6.0–8.3)
Total Bilirubin: 0.3 mg/dL (ref 0.2–1.2)

## 2018-07-06 LAB — LIPID PANEL
CHOLESTEROL: 174 mg/dL (ref 0–200)
HDL: 51.3 mg/dL (ref >39.00)
LDL Cholesterol: 89 mg/dL (ref 0–99)
NonHDL: 122.31
Total CHOL/HDL Ratio: 3
Triglycerides: 169 mg/dL — ABNORMAL HIGH (ref 0.0–149.0)
VLDL: 33.8 mg/dL (ref 0.0–40.0)

## 2018-07-06 LAB — CBC
HCT: 42.4 % (ref 36.0–46.0)
Hemoglobin: 13.9 g/dL (ref 12.0–15.0)
MCHC: 32.6 g/dL (ref 30.0–36.0)
MCV: 93.3 fl (ref 78.0–100.0)
PLATELETS: 310 10*3/uL (ref 150.0–400.0)
RBC: 4.55 Mil/uL (ref 3.87–5.11)
RDW: 14.2 % (ref 11.5–15.5)
WBC: 8.5 10*3/uL (ref 4.0–10.5)

## 2018-07-06 LAB — VITAMIN D 25 HYDROXY (VIT D DEFICIENCY, FRACTURES): VITD: 32.33 ng/mL (ref 30.00–100.00)

## 2018-07-06 LAB — HEMOGLOBIN A1C: Hgb A1c MFr Bld: 5.7 % (ref 4.6–6.5)

## 2018-07-06 NOTE — Patient Instructions (Signed)
Katherine [name] , Thank you for taking time to come for your Medicare Wellness Visit. I appreciate your ongoing commitment to your health goals. Please review the following plan we discussed and let me know if I can assist you in the future.   These are the goals we discussed: Goals    . Patient Stated     Starting 07/20/2018, I will attempt to drink 2 bottles of water daily and to walk for 30 minutes 3-5 days per week.        This is a list of the screening recommended for you and due dates:  Health Maintenance  Topic Date Due  . Pneumonia vaccines (2 of 2 - PCV13) 07/07/2019  . Mammogram  06/01/2020  . Colon Cancer Screening  04/22/2022  . Tetanus Vaccine  12/09/2024  . Flu Shot  Completed  . DEXA scan (bone density measurement)  Completed  .  Hepatitis C: One time screening is recommended by Center for Disease Control  (CDC) for  adults born from [birth year] through 1964.   Completed   Preventive Care for Adults  A healthy lifestyle and preventive care can promote health and wellness. Preventive health guidelines for adults include the following key practices.  . A routine yearly physical is a good way to check with your health care provider about your health and preventive screening. It is a chance to share any concerns and updates on your health and to receive a thorough exam.  . Visit your dentist for a routine exam and preventive care every 6 months. Brush your teeth twice a day and floss once a day. Good oral hygiene prevents tooth decay and gum disease.  . The frequency of eye exams is based on your age, health, family medical history, use  of contact lenses, and other factors. Follow your health care provider's recommendations for frequency of eye exams.  . Eat a healthy diet. Foods like vegetables, fruits, whole grains, low-fat dairy products, and lean protein foods contain the nutrients you need without too many calories. Decrease your intake of foods high in solid fats, added  sugars, and salt. Eat the right amount of calories for you. Get information about a proper diet from your health care provider, if necessary.  . Regular physical exercise is one of the most important things you can do for your health. Most adults should get at least 150 minutes of moderate-intensity exercise (any activity that increases your heart rate and causes you to sweat) each week. In addition, most adults need muscle-strengthening exercises on 2 or more days a week.  Silver Sneakers may be a benefit available to you. To determine eligibility, you may visit the website: www.silversneakers.com or contact program at 865-485-6692 Mon-Fri between 8AM-8PM.   . Maintain a healthy weight. The body mass index (BMI) is a screening tool to identify possible weight problems. It provides an estimate of body fat based on height and weight. Your health care provider can find your BMI and can help you achieve or maintain a healthy weight.   For adults 20 years and older: ? A BMI below 18.5 is considered underweight. ? A BMI of 18.5 to 24.9 is normal. ? A BMI of 25 to 29.9 is considered overweight. ? A BMI of 30 and above is considered obese.   . Maintain normal blood lipids and cholesterol levels by exercising and minimizing your intake of saturated fat. Eat a balanced diet with plenty of fruit and vegetables. Blood tests for lipids and cholesterol  should begin at age 45 and be repeated every 5 years. If your lipid or cholesterol levels are high, you are over 50, or you are at high risk for heart disease, you may need your cholesterol levels checked more frequently. Ongoing high lipid and cholesterol levels should be treated with medicines if diet and exercise are not working.  . If you smoke, find out from your health care provider how to quit. If you do not use tobacco, please do not start.  . If you choose to drink alcohol, please do not consume more than 2 drinks per day. One drink is considered to  be 12 ounces (355 mL) of beer, 5 ounces (148 mL) of wine, or 1.5 ounces (44 mL) of liquor.  . If you are 23-61 years old, ask your health care provider if you should take aspirin to prevent strokes.  . Use sunscreen. Apply sunscreen liberally and repeatedly throughout the day. You should seek shade when your shadow is shorter than you. Protect yourself by wearing long sleeves, pants, a wide-brimmed hat, and sunglasses year round, whenever you are outdoors.  . Once a month, do a whole body skin exam, using a mirror to look at the skin on your back. Tell your health care provider of new moles, moles that have irregular borders, moles that are larger than a pencil eraser, or moles that have changed in shape or color.

## 2018-07-06 NOTE — Progress Notes (Signed)
PCP notes:   Health maintenance:  PPSV23 - administered Hep C screening - completed  Abnormal screenings:   Hearing - failed  Hearing Screening   125Hz  250Hz  500Hz  1000Hz  2000Hz  3000Hz  4000Hz  6000Hz  8000Hz   Right ear:   40 40 40  0    Left ear:   40 0 40  40     Patient concerns:   None  Nurse concerns:  Please review diagnosis of diabetes.  Next PCP appt:   07/07/18 @ 1520

## 2018-07-06 NOTE — Progress Notes (Signed)
Subjective:   Katherine [name] is a 67 y.o. female who presents for an Initial Medicare Annual Wellness Visit.  Review of Systems    N/A  Cardiac Risk Factors include: advanced age (>54men, >29 women);dyslipidemia;hypertension;obesity (BMI >30kg/m2)     Objective:    Today's Vitals   07/06/18 1236  BP: 110/70  Pulse: 96  Temp: 97.9 F (36.6 C)  TempSrc: Oral  SpO2: 97%  Weight: 186 lb 8 oz (84.6 kg)  Height: 5\' 2"  (1.575 m)  PainSc: 5   PainLoc: Back   Body mass index is 34.11 kg/m.  Advanced Directives 07/06/2018 04/22/2017 07/17/2016 07/07/2016 01/12/2016 01/09/2016  Does Patient Have a Medical Advance Directive? No No No No No No  Would patient like information on creating a medical advance directive? No - Patient declined - Yes (MAU/Ambulatory/Procedural Areas - Information given) Yes (MAU/Ambulatory/Procedural Areas - Information given) No - patient declined information Yes - Educational materials given    Current Medications (verified) Outpatient Encounter Medications as of 07/06/2018  Medication Sig  . baclofen (LIORESAL) 10 MG tablet TAKE 1 TABLET BY MOUTH TWICE A DAY AS NEEDED . LIMIT 1-2 TREATMENTS PER WEEK  . carvedilol (COREG) 12.5 MG tablet TAKE 1 TABLET (12.5 MG TOTAL) BY MOUTH 2 (TWO) TIMES DAILY.  . cetirizine (ZYRTEC) 10 MG tablet Take 10 mg by mouth daily as needed for allergies.  . diazepam (VALIUM) 10 MG tablet Take 10 mg by mouth at bedtime.  . DULoxetine (CYMBALTA) 60 MG capsule Take 120 mg by mouth daily.   . pantoprazole (PROTONIX) 40 MG tablet Take 1 tablet (40 mg total) by mouth daily.  . simvastatin (ZOCOR) 20 MG tablet TAKE 1 TABLET EVERY DAY  . spironolactone (ALDACTONE) 25 MG tablet TAKE 1 TABLET BY MOUTH TWICE A DAY  . topiramate (TOPAMAX) 100 MG tablet Take 1 tablet (100 mg total) by mouth daily. (Patient taking differently: Take 100 mg by mouth 2 (two) times daily. )  . losartan (COZAAR) 50 MG tablet Take 1 tablet (50 mg total) by mouth  daily.  . [DISCONTINUED] cariprazine (VRAYLAR) capsule Take 1.5 mg by mouth daily.   . [DISCONTINUED] sulfamethoxazole-trimethoprim (BACTRIM DS,SEPTRA DS) 800-160 MG tablet Take 1 tablet by mouth 2 (two) times daily.   No facility-administered encounter medications on file as of 07/06/2018.     Allergies (verified) Codeine; Darvon; Hydrocodone; Meperidine; Oxycodone; Tramadol; and Victoza [liraglutide]   History: Past Medical History:  Diagnosis Date  . Anemia 2018  . Anxiety   . Arthritis   . Borderline diabetes   . Chest pain    a. 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 Chicken pox    age 21  . Chronic back pain    Compressed discs. [place]  . Depression   . Dyspnea on exertion    a. 03/2009 Echo: EF 65%, no rwma, triv TR.  Marland Kitchen GERD (gastroesophageal reflux disease)   . High cholesterol   . Hypertension   . Hypokalemia   . Migraine headache   . Obesity   . Pre-diabetes    okay now    Past Surgical History:  Procedure Laterality Date  . BLADDER SUSPENSION  2005  . CARDIAC CATHETERIZATION    . CESAREAN SECTION    . ENDOMETRIAL ABLATION  2007  . FOOT SURGERY     bilateral bunions  . LUMBAR LAMINECTOMY/DECOMPRESSION MICRODISCECTOMY Bilateral 01/12/2016   Procedure: Bilateral L4-5  Laminotomy/Foraminotomy;  Surgeon: Newman Pies, MD;  Location: [place] [facility];  Service: Neurosurgery;  Laterality: Bilateral;  Bilateral L4-5 Laminotomy/Foraminotomy  . TONSILLECTOMY  1971   Family History  Problem Relation Age of Onset  . Heart disease Mother   . Hypertension Mother   . Heart failure Mother        CHF  . Diabetes Mother   . Cancer Mother        CERVICAL CANCER  . Heart disease Father   . Diabetes Father   . Liver disease Father   . Hypertension Father   . Diabetes Sister   . Breast cancer Sister 2  . Heart disease Brother   . Heart failure Brother        CHF   . Diabetes Brother   . Cancer Brother 53       THROAT AND NECK  . Cancer Maternal Grandfather   . Colon cancer Neg Hx   . Stomach cancer Neg Hx   . Rectal cancer Neg Hx    Social History   Socioeconomic History  . Marital status: Married    Spouse name: Not on file  . Number of children: Not on file  . Years of education: Not on file  . Highest education level: Not on file  Occupational History  . Occupation: Product manager: [employer]  Social Needs  . Financial resource strain: Not on file  . Food insecurity:    Worry: Not on file    Inability: Not on file  . Transportation needs:    Medical: Not on file    Non-medical: Not on file  Tobacco Use  . Smoking status: Never Smoker  . Smokeless tobacco: Never Used  Substance and Sexual Activity  . Alcohol use: No  . Drug use: No  . Sexual activity: Yes    Partners: Male    Birth control/protection: Post-menopausal  Lifestyle  . Physical activity:    Days per week: Not on file    Minutes per session: Not on file  . Stress: Not on file  Relationships  . Social connections:    Talks on phone: Not on file    Gets together: Not on file    Attends religious service: Not on file    Active member of club or organization: Not on file    Attends meetings of clubs or organizations: Not on file    Relationship status: Not on file  Other Topics Concern  . Not on file  Social History Narrative   Married.   One son is 13, lives in [place].   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   Enjoys substitute teaching, gardening, cooking.    Tobacco Counseling Counseling given: No   Clinical Intake:  Pre-visit preparation completed: Yes  Pain : No/denies pain Pain Score: 5      Nutritional Status: BMI 25 -29 Overweight Diabetes: No  How often do you need to have someone help you when you read instructions, pamphlets, or other written materials from your doctor or pharmacy?: 1 - Never What is the last grade level you  completed in school?: Master degree  Interpreter Needed?: No  Comments: pt lives with spouse Information entered by :: LPinson, LPN   Activities of Daily Living In your present state of health, do you have any difficulty performing the following activities: 07/06/2018  Hearing? Y  Vision? N  Difficulty concentrating or making decisions? N  Walking or climbing stairs? Y  Comment back pain  Dressing or bathing? N  Doing errands, shopping? N  Preparing Food and eating ? N  Using the Toilet? N  In the past six months, have you accidently leaked urine? N  Do you have problems with loss of bowel control? N  Managing your Medications? N  Managing your Finances? N  Housekeeping or managing your Housekeeping? N  Some recent data might be hidden     Immunizations and Health Maintenance Immunization History  Administered Date(s) Administered  . Influenza,inj,Quad PF,6+ Mos 05/09/2015, 05/07/2016, 03/30/2017, 05/10/2018  . Influenza-Unspecified 05/01/2014  . Pneumococcal Polysaccharide-23 12/10/2014, 07/06/2018  . Td 12/10/2014  . Zoster 04/12/2009   There are no preventive care reminders to display for this patient.  Patient Care Team: [name], NP as PCP - General (Nurse Practitioner) Minna Merritts, MD as PCP - Cardiology (Cardiology) Minna Merritts, MD as Consulting Physician (Cardiology)  Indicate any recent Medical Services you may have received from other than [facility] providers in the past year (date may be approximate).     Assessment:   This is a routine wellness examination for [place].  Hearing/Vision screen  Hearing Screening   125Hz  250Hz  500Hz  1000Hz  2000Hz  3000Hz  4000Hz  6000Hz  8000Hz   Right ear:   40 40 40  0    Left ear:   40 0 40  40    Vision Screening Comments: Vision exam in Feb 2019 with Dr. Matilde Sprang  Dietary issues and exercise activities discussed: Current Exercise Habits: The patient does not participate in regular exercise at present,  Exercise limited by: orthopedic condition(s)  Goals    . Patient Stated     Starting 07/20/2018, I will attempt to drink 2 bottles of water daily and to walk for 30 minutes 3-5 days per week.       Depression Screen PHQ 2/9 Scores 07/06/2018  PHQ - 2 Score 0  PHQ- 9 Score 0    Fall Risk Fall Risk  07/06/2018  Falls in the past year? 0   Cognitive Function: MMSE - Mini Mental State Exam 07/06/2018  Orientation to time 5  Orientation to Place 5  Registration 3  Attention/ Calculation 0  Recall 3  Language- name 2 objects 0  Language- repeat 1  Language- follow 3 step command 3  Language- read & follow direction 0  Write a sentence 0  Copy design 0  Total score 20     PLEASE NOTE: A Mini-Cog screen was completed. Maximum score is 20. A value of 0 denotes this part of [place] MMSE was not completed or the patient failed this part of the Mini-Cog screening.   Mini-Cog Screening Orientation to Time - Max 5 pts Orientation to Place - Max 5 pts Registration - Max 3 pts Recall - Max 3 pts Language Repeat - Max 1 pts Language Follow 3 Step Command - Max 3 pts     Screening Tests Health Maintenance  Topic Date Due  . PNA vac Low Risk Adult (2 of 2 - PCV13) 07/07/2019  . MAMMOGRAM  06/01/2020  . COLONOSCOPY  04/22/2022  . TETANUS/TDAP  12/09/2024  . INFLUENZA VACCINE  Completed  . DEXA SCAN  Completed  . Hepatitis C Screening  Completed      Plan:     I have personally reviewed, addressed, and noted the following in the patient's chart:  A. Medical and social history B. Use of alcohol, tobacco or illicit drugs  C. Current medications and supplements D. Functional ability and status E.  Nutritional status F.  Physical activity G. Advance directives H. List of other physicians I.  Hospitalizations, surgeries, and ER visits in previous 12 months J.  [place] to include hearing, vision, cognitive, depression L. Referrals and appointments - none  In  addition, I have reviewed and discussed with patient certain preventive protocols, quality metrics, and best practice recommendations. A written personalized care plan for preventive services as well as general preventive health recommendations were provided to patient.  See attached scanned questionnaire for additional information.   Signed,   Lindell Noe, MHA, BS, LPN Health Coach

## 2018-07-07 ENCOUNTER — Ambulatory Visit (INDEPENDENT_AMBULATORY_CARE_PROVIDER_SITE_OTHER): Payer: Medicare Other | Admitting: Primary Care

## 2018-07-07 ENCOUNTER — Encounter: Payer: Self-pay | Admitting: Primary Care

## 2018-07-07 VITALS — BP 100/74 | HR 87 | Temp 97.5°F | Ht 62.0 in | Wt 185.0 lb

## 2018-07-07 DIAGNOSIS — F32A Depression, unspecified: Secondary | ICD-10-CM

## 2018-07-07 DIAGNOSIS — M4316 Spondylolisthesis, lumbar region: Secondary | ICD-10-CM

## 2018-07-07 DIAGNOSIS — G43709 Chronic migraine without aura, not intractable, without status migrainosus: Secondary | ICD-10-CM

## 2018-07-07 DIAGNOSIS — D649 Anemia, unspecified: Secondary | ICD-10-CM

## 2018-07-07 DIAGNOSIS — Z0000 Encounter for general adult medical examination without abnormal findings: Secondary | ICD-10-CM | POA: Diagnosis not present

## 2018-07-07 DIAGNOSIS — I10 Essential (primary) hypertension: Secondary | ICD-10-CM | POA: Diagnosis not present

## 2018-07-07 DIAGNOSIS — M8580 Other specified disorders of bone density and structure, unspecified site: Secondary | ICD-10-CM

## 2018-07-07 DIAGNOSIS — R7303 Prediabetes: Secondary | ICD-10-CM

## 2018-07-07 DIAGNOSIS — G4733 Obstructive sleep apnea (adult) (pediatric): Secondary | ICD-10-CM

## 2018-07-07 DIAGNOSIS — E559 Vitamin D deficiency, unspecified: Secondary | ICD-10-CM

## 2018-07-07 DIAGNOSIS — K219 Gastro-esophageal reflux disease without esophagitis: Secondary | ICD-10-CM

## 2018-07-07 DIAGNOSIS — F329 Major depressive disorder, single episode, unspecified: Secondary | ICD-10-CM

## 2018-07-07 DIAGNOSIS — G2581 Restless legs syndrome: Secondary | ICD-10-CM

## 2018-07-07 DIAGNOSIS — E782 Mixed hyperlipidemia: Secondary | ICD-10-CM

## 2018-07-07 LAB — HEPATITIS C ANTIBODY
Hepatitis C Ab: NONREACTIVE
SIGNAL TO CUT-OFF: 0.03 (ref <1.00)

## 2018-07-07 MED ORDER — DULOXETINE HCL 30 MG PO CPEP: 150.0000 mg | ORAL_CAPSULE | Freq: Every day | ORAL | 0 refills | Status: DC

## 2018-07-07 NOTE — Assessment & Plan Note (Signed)
Immunizations up-to-date. Mammogram and bone density scan up-to-date. Colonoscopy up-to-date. Recommended regular exercise, continue to work on diet. Exam unremarkable. Labs reviewed. Follow-up in 1 year for CPE.

## 2018-07-07 NOTE — Assessment & Plan Note (Signed)
Doing well on pantoprazole 40 mg, continue same.

## 2018-07-07 NOTE — Assessment & Plan Note (Signed)
Chronic back pain, following with orthopedics and pain management. Undergoing spinal injections.

## 2018-07-07 NOTE — Assessment & Plan Note (Signed)
No recent migraines, compliant to topiramate 100 mg BID. Continue same.

## 2018-07-07 NOTE — Assessment & Plan Note (Signed)
Following with psychiatry and is doing much better overall since increase of her Cymbalta to 150 mg and topiramate to 100 mg twice daily.  She is also taking Valium at night for anxiety and sleep.  Continue current regimen.

## 2018-07-07 NOTE — Assessment & Plan Note (Signed)
Overall improved.  Recommended regular exercise.  Continue to monitor annually.

## 2018-07-07 NOTE — Assessment & Plan Note (Signed)
Normal on recent labs. Continue to monitor.  

## 2018-07-07 NOTE — Assessment & Plan Note (Addendum)
Noted on recent bone density scan.  Discussed to start calcium 1200 mg daily, increase vitamin D to 4000 units daily. Repeat bone density in 2021.

## 2018-07-07 NOTE — Assessment & Plan Note (Signed)
Recent A1c 5.7.  Recommended she continue to work on her diet and start with regular exercise. Continue to monitor.

## 2018-07-07 NOTE — Assessment & Plan Note (Signed)
Lower extremity pain could be coming from restless leg syndrome, however more likely from chronic lower back pain.  She will follow-up with pain management and discuss.  Hold off on treatment for restless legs as she does not have this issue at this time.

## 2018-07-07 NOTE — Progress Notes (Signed)
Subjective:    Patient ID: Katherine [name], female    DOB: 12/22/1950, 67 y.o.   MRN: [id number]  HPI  Ms. Katherine [name] is a 67 year old female who presents today for complete physical.  Immunizations: -Tetanus: Completed in 2016 -Influenza: Completed this season  -Pneumonia: Completed in 2019 -Shingles: Completed Zostavax in 2010  Diet: She endorses a fair diet. Breakfast: Skips Lunch: Skips Dinner: Vegetables, meat Snacks: None Desserts: Once weekly  Beverages: Water, Diet Soda,   Exercise: She is not exercising Eye exam: Completed in 2019 Dental exam: Completes semi-annually  Colonoscopy: Completed in 2018 Dexa: Completed in 2019, osteopenia. She is taking 2000 units of vitamin D daily. No calcium.  Mammogram: Completed in November 2019 Hep C Screen: Negative   Wt Readings from Last 3 Encounters:  07/07/18 185 lb (83.9 kg)  07/06/18 186 lb 8 oz (84.6 kg)  06/14/18 188 lb 12.8 oz (85.6 kg)   BP Readings from Last 3 Encounters:  07/07/18 100/74  07/06/18 110/70  06/14/18 110/78     Review of Systems  Constitutional: Negative for unexpected weight change.  HENT: Negative for rhinorrhea.   Respiratory: Negative for cough and shortness of breath.   Cardiovascular: Negative for chest pain.  Gastrointestinal: Negative for diarrhea.  Genitourinary: Negative for difficulty urinating.  Musculoskeletal: Positive for arthralgias and back pain.       Chronic bilateral lower back pain, chronic bilateral lower extremity pain.  Skin: Negative for rash.  Allergic/Immunologic: Negative for environmental allergies.  Neurological: Negative for dizziness, numbness and headaches.  Psychiatric/Behavioral:       Feels well managed on current regimen.       Past Medical History:  Diagnosis Date  . Anemia 2018  . Anxiety   . Arthritis   . Borderline diabetes   . Chest pain    a. 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 Chicken pox    age 39  . Chronic back pain    Compressed discs. [place]  . Depression   . Dyspnea on exertion    a. 03/2009 Echo: EF 65%, no rwma, triv TR.  Marland Kitchen GERD (gastroesophageal reflux disease)   . High cholesterol   . Hypertension   . Hypokalemia   . Migraine headache   . Obesity   . Pre-diabetes    okay now      Social History   Socioeconomic History  . Marital status: Married    Spouse name: Not on file  . Number of children: Not on file  . Years of education: Not on file  . Highest education level: Not on file  Occupational History  . Occupation: Product manager: [employer]  Social Needs  . Financial resource strain: Not on file  . Food insecurity:    Worry: Not on file    Inability: Not on file  . Transportation needs:    Medical: Not on file    Non-medical: Not on file  Tobacco Use  . Smoking status: Never Smoker  . Smokeless tobacco: Never Used  Substance and Sexual Activity  . Alcohol use: No  . Drug use: No  . Sexual activity: Yes    Partners: Male    Birth control/protection: Post-menopausal  Lifestyle  . Physical activity:    Days per week: Not on file    Minutes per session: Not on file  . Stress: Not  on file  Relationships  . Social connections:    Talks on phone: Not on file    Gets together: Not on file    Attends religious service: Not on file    Active member of club or organization: Not on file    Attends meetings of clubs or organizations: Not on file    Relationship status: Not on file  . Intimate partner violence:    Fear of current or ex partner: Not on file    Emotionally abused: Not on file    Physically abused: Not on file    Forced sexual activity: Not on file  Other Topics Concern  . Not on file  Social History Narrative   Married.   One son is 77, lives in [place].   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   Enjoys substitute teaching, gardening, cooking.     Past Surgical History:  Procedure Laterality Date  . BLADDER SUSPENSION  2005  . CARDIAC CATHETERIZATION    . CESAREAN SECTION    . ENDOMETRIAL ABLATION  2007  . FOOT SURGERY     bilateral bunions  . LUMBAR LAMINECTOMY/DECOMPRESSION MICRODISCECTOMY Bilateral 01/12/2016   Procedure: Bilateral L4-5 Laminotomy/Foraminotomy;  Surgeon: Newman Pies, MD;  Location: [place] [facility];  Service: Neurosurgery;  Laterality: Bilateral;  Bilateral L4-5 Laminotomy/Foraminotomy  . TONSILLECTOMY  1971    Family History  Problem Relation Age of Onset  . Heart disease Mother   . Hypertension Mother   . Heart failure Mother        CHF  . Diabetes Mother   . Cancer Mother        CERVICAL CANCER  . Heart disease Father   . Diabetes Father   . Liver disease Father   . Hypertension Father   . Diabetes Sister   . Breast cancer Sister 3  . Heart disease Brother   . Heart failure Brother        CHF  . Diabetes Brother   . Cancer Brother 34       THROAT AND NECK  . Cancer Maternal Grandfather   . Colon cancer Neg Hx   . Stomach cancer Neg Hx   . Rectal cancer Neg Hx     Allergies  Allergen Reactions  . Codeine Anxiety  . Darvon Anxiety  . Hydrocodone Anxiety  . Meperidine Anxiety  . Oxycodone Anxiety  . Tramadol Anxiety  . Victoza [Liraglutide] Nausea Only and Other (See Comments)    And weakness    Current Outpatient Medications on File Prior to Visit  Medication Sig Dispense Refill  . baclofen (LIORESAL) 10 MG tablet TAKE 1 TABLET BY MOUTH TWICE A DAY AS NEEDED . LIMIT 1-2 TREATMENTS PER WEEK  0  . carvedilol (COREG) 12.5 MG tablet TAKE 1 TABLET (12.5 MG TOTAL) BY MOUTH 2 (TWO) TIMES DAILY. 180 tablet 3  . cetirizine (ZYRTEC) 10 MG tablet Take 10 mg by mouth daily as needed for allergies.    . diazepam (VALIUM) 10 MG tablet Take 10 mg by mouth at bedtime.  2  . pantoprazole (PROTONIX) 40 MG tablet Take 1 tablet (40 mg total) by mouth daily. 90 tablet 3  . simvastatin (ZOCOR) 20  MG tablet TAKE 1 TABLET EVERY DAY 90 tablet 3  . spironolactone (ALDACTONE) 25 MG tablet TAKE 1 TABLET BY MOUTH TWICE A DAY 60 tablet 3  . topiramate (TOPAMAX) 100 MG tablet Take 1 tablet (100 mg total) by mouth daily. (Patient taking differently: Take 100 mg by mouth  2 (two) times daily. ) 90 tablet 3  . losartan (COZAAR) 50 MG tablet Take 1 tablet (50 mg total) by mouth daily. 90 tablet 3   No current facility-administered medications on file prior to visit.     BP 100/74 (BP Location: Right Arm, Patient Position: Sitting, Cuff Size: Normal)   Pulse 87   Temp (!) 97.5 F (36.4 C) (Oral)   Ht 5\' 2"  (1.575 m)   Wt 185 lb (83.9 kg)   SpO2 97%   BMI 33.84 kg/m    Objective:   Physical Exam  Constitutional: She is oriented to person, place, and time. She appears well-nourished.  HENT:  Mouth/Throat: No oropharyngeal exudate.  Eyes: Pupils are equal, round, and reactive to light. EOM are normal.  Neck: Neck supple. No thyromegaly present.  Cardiovascular: Normal rate and regular rhythm.  Respiratory: Effort normal and breath sounds normal.  GI: Soft. Bowel sounds are normal. There is no abdominal tenderness.  Musculoskeletal: Normal range of motion.  Neurological: She is alert and oriented to person, place, and time.  Skin: Skin is warm and dry.  Psychiatric: She has a normal mood and affect.           Assessment & Plan:

## 2018-07-07 NOTE — Patient Instructions (Signed)
Start taking Calcium 1200 mg daily, vitamin D 4000 units daily.  Start exercising. You should be getting 150 minutes of exercise weekly.  Continue to eat a healthy diet.  We will see you in one year for your annual exam or sooner if needed.  It was a pleasure to see you today!   Preventive Care 46 Years and Older, Female Preventive care refers to lifestyle choices and visits with your health care provider that can promote health and wellness. What does preventive care include?  A yearly physical exam. This is also called an annual well check.  Dental exams once or twice a year.  Routine eye exams. Ask your health care provider how often you should have your eyes checked.  Personal lifestyle choices, including: ? Daily care of your teeth and gums. ? Regular physical activity. ? Eating a healthy diet. ? Avoiding tobacco and drug use. ? Limiting alcohol use. ? Practicing safe sex. ? Taking low-dose aspirin every day. ? Taking vitamin and mineral supplements as recommended by your health care provider. What happens during an annual well check? The services and screenings done by your health care provider during your annual well check will depend on your age, overall health, lifestyle risk factors, and family history of disease. Counseling Your health care provider may ask you questions about your:  Alcohol use.  Tobacco use.  Drug use.  Emotional well-being.  Home and relationship well-being.  Sexual activity.  Eating habits.  History of falls.  Memory and ability to understand (cognition).  Work and work Statistician.  Reproductive health.  Screening You may have the following tests or measurements:  Height, weight, and BMI.  Blood pressure.  Lipid and cholesterol levels. These may be checked every 5 years, or more frequently if you are over 53 years old.  Skin check.  Lung cancer screening. You may have this screening every year starting at age 61 if  you have a 30-pack-year history of smoking and currently smoke or have quit within the past 15 years.  Colorectal cancer screening. All adults should have this screening starting at age 52 and continuing until age 88. You will have tests every 1-10 years, depending on your results and the type of screening test. People at increased risk should start screening at an earlier age. Screening tests may include: ? Guaiac-based fecal occult blood testing. ? Fecal immunochemical test (FIT). ? Stool DNA test. ? Virtual colonoscopy. ? Sigmoidoscopy. During this test, a flexible tube with a tiny camera (sigmoidoscope) is used to examine your rectum and lower colon. The sigmoidoscope is inserted through your anus into your rectum and lower colon. ? Colonoscopy. During this test, a long, thin, flexible tube with a tiny camera (colonoscope) is used to examine your entire colon and rectum.  Hepatitis C blood test.  Hepatitis B blood test.  Sexually transmitted disease (STD) testing.  Diabetes screening. This is done by checking your blood sugar (glucose) after you have not eaten for a while (fasting). You may have this done every 1-3 years.  Bone density scan. This is done to screen for osteoporosis. You may have this done starting at age 5.  Mammogram. This may be done every 1-2 years. Talk to your health care provider about how often you should have regular mammograms. Talk with your health care provider about your test results, treatment options, and if necessary, the need for more tests. Vaccines Your health care provider may recommend certain vaccines, such as:  Influenza vaccine. This  is recommended every year.  Tetanus, diphtheria, and acellular pertussis (Tdap, Td) vaccine. You may need a Td booster every 10 years.  Varicella vaccine. You may need this if you have not been vaccinated.  Zoster vaccine. You may need this after age 35.  Measles, mumps, and rubella (MMR) vaccine. You may need  at least one dose of MMR if you were born in 1957 or later. You may also need a second dose.  Pneumococcal 13-valent conjugate (PCV13) vaccine. One dose is recommended after age 28.  Pneumococcal polysaccharide (PPSV23) vaccine. One dose is recommended after age 11.  Meningococcal vaccine. You may need this if you have certain conditions.  Hepatitis A vaccine. You may need this if you have certain conditions or if you travel or work in places where you may be exposed to hepatitis A.  Hepatitis B vaccine. You may need this if you have certain conditions or if you travel or work in places where you may be exposed to hepatitis B.  Haemophilus influenzae type b (Hib) vaccine. You may need this if you have certain conditions. Talk to your health care provider about which screenings and vaccines you need and how often you need them. This information is not intended to replace advice given to you by your health care provider. Make sure you discuss any questions you have with your health care provider. Document Released: 08/01/2015 Document Revised: 08/25/2017 Document Reviewed: 05/06/2015 Elsevier Interactive Patient Education  2019 [employer].

## 2018-07-07 NOTE — Assessment & Plan Note (Signed)
Compliant to vitamin D 2000 units daily.  Recent vitamin D level at 32.  Increase oral vitamin D to 4000 units daily, add calcium 1200 mg daily.

## 2018-07-07 NOTE — Assessment & Plan Note (Signed)
Stable in the office today, continue current regimen.  BMP reviewed. 

## 2018-07-07 NOTE — Assessment & Plan Note (Signed)
No CPAP machine. Denies snoring since weight loss.

## 2018-07-09 NOTE — Progress Notes (Signed)
I reviewed health advisor's note, was available for consultation, and agree with documentation and plan.  

## 2018-07-10 ENCOUNTER — Ambulatory Visit: Payer: Medicare Other | Admitting: Primary Care

## 2018-07-18 ENCOUNTER — Encounter: Payer: Self-pay | Admitting: Internal Medicine

## 2018-07-18 ENCOUNTER — Ambulatory Visit: Payer: Medicare Other | Admitting: Internal Medicine

## 2018-07-18 VITALS — BP 112/70 | HR 87 | Temp 97.3°F | Ht 62.0 in | Wt 187.0 lb

## 2018-07-18 DIAGNOSIS — N3000 Acute cystitis without hematuria: Secondary | ICD-10-CM | POA: Insufficient documentation

## 2018-07-18 DIAGNOSIS — R300 Dysuria: Secondary | ICD-10-CM

## 2018-07-18 LAB — POC URINALSYSI DIPSTICK (AUTOMATED)
Bilirubin, UA: NEGATIVE
Blood, UA: NEGATIVE
Glucose, UA: NEGATIVE
KETONES UA: NEGATIVE
Nitrite, UA: NEGATIVE
PH UA: 6 (ref 5.0–8.0)
PROTEIN UA: NEGATIVE
SPEC GRAV UA: 1.02 (ref 1.010–1.025)
UROBILINOGEN UA: 0.2 U/dL

## 2018-07-18 MED ORDER — SULFAMETHOXAZOLE-TRIMETHOPRIM 800-160 MG PO TABS: 1.0000 | ORAL_TABLET | Freq: Two times a day (BID) | ORAL | 0 refills | Status: DC

## 2018-07-18 NOTE — Patient Instructions (Signed)
Start the antibiotic today and take 2 doses. If your symptoms are gone by tomorrow, you can stop after 3 days. Try a lubricant for sex and take 1 antibiotic tab within 1 hour after sex to prevent infections.

## 2018-07-18 NOTE — Progress Notes (Signed)
Subjective:    Patient ID: Katherine [name], female    DOB: 12-22-1950, 67 y.o.   MRN: [id number]  HPI Here due to urinary symptoms again  Similar symptoms to 1 month ago--these resolved Symptoms started again 2 days ago Lots of pressure Pain with voiding Feels that she is not finished Gets urgency--but not much there No blood in urine No fever  No Rx   Current Outpatient Medications on File Prior to Visit  Medication Sig Dispense Refill  . baclofen (LIORESAL) 10 MG tablet TAKE 1 TABLET BY MOUTH TWICE A DAY AS NEEDED . LIMIT 1-2 TREATMENTS PER WEEK  0  . carvedilol (COREG) 12.5 MG tablet TAKE 1 TABLET (12.5 MG TOTAL) BY MOUTH 2 (TWO) TIMES DAILY. 180 tablet 3  . cetirizine (ZYRTEC) 10 MG tablet Take 10 mg by mouth daily as needed for allergies.    . diazepam (VALIUM) 10 MG tablet Take 10 mg by mouth at bedtime.  2  . DULoxetine (CYMBALTA) 30 MG capsule Take 5 capsules (150 mg total) by mouth daily. 150 capsule 0  . pantoprazole (PROTONIX) 40 MG tablet Take 1 tablet (40 mg total) by mouth daily. 90 tablet 3  . simvastatin (ZOCOR) 20 MG tablet TAKE 1 TABLET EVERY DAY 90 tablet 3  . spironolactone (ALDACTONE) 25 MG tablet TAKE 1 TABLET BY MOUTH TWICE A DAY 60 tablet 3  . topiramate (TOPAMAX) 100 MG tablet Take 1 tablet (100 mg total) by mouth daily. (Patient taking differently: Take 100 mg by mouth 2 (two) times daily. ) 90 tablet 3  . losartan (COZAAR) 50 MG tablet Take 1 tablet (50 mg total) by mouth daily. 90 tablet 3   No current facility-administered medications on file prior to visit.     Allergies  Allergen Reactions  . Codeine Anxiety  . Darvon Anxiety  . Hydrocodone Anxiety  . Meperidine Anxiety  . Oxycodone Anxiety  . Tramadol Anxiety  . Victoza [Liraglutide] Nausea Only and Other (See Comments)    And weakness    Past Medical History:  Diagnosis Date  . Anemia 2018  . Anxiety   . Arthritis   . Borderline diabetes   . Chest pain    a. 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 Chicken pox    age 29  . Chronic back pain    Compressed discs. [place]  . Depression   . Dyspnea on exertion    a. 03/2009 Echo: EF 65%, no rwma, triv TR.  Marland Kitchen GERD (gastroesophageal reflux disease)   . High cholesterol   . Hypertension   . Hypokalemia   . Migraine headache   . Obesity   . Pre-diabetes    okay now     Past Surgical History:  Procedure Laterality Date  . BLADDER SUSPENSION  2005  . CARDIAC CATHETERIZATION    . CESAREAN SECTION    . ENDOMETRIAL ABLATION  2007  . FOOT SURGERY     bilateral bunions  . LUMBAR LAMINECTOMY/DECOMPRESSION MICRODISCECTOMY Bilateral 01/12/2016   Procedure: Bilateral L4-5 Laminotomy/Foraminotomy;  Surgeon: Newman Pies, MD;  Location: [facility];  Service: Neurosurgery;  Laterality: Bilateral;  Bilateral L4-5 Laminotomy/Foraminotomy  . TONSILLECTOMY  1971    Family History  Problem Relation Age of Onset  . Heart disease Mother   . Hypertension Mother   . Heart failure Mother        CHF  .  Diabetes Mother   . Cancer Mother        CERVICAL CANCER  . Heart disease Father   . Diabetes Father   . Liver disease Father   . Hypertension Father   . Diabetes Sister   . Breast cancer Sister 91  . Heart disease Brother   . Heart failure Brother        CHF  . Diabetes Brother   . Cancer Brother 57       THROAT AND NECK  . Cancer Maternal Grandfather   . Colon cancer Neg Hx   . Stomach cancer Neg Hx   . Rectal cancer Neg Hx     Social History   Socioeconomic History  . Marital status: Married    Spouse name: Not on file  . Number of children: Not on file  . Years of education: Not on file  . Highest education level: Not on file  Occupational History  . Occupation: Product manager: [employer]  Social Needs  . Financial resource strain: Not on file  . Food insecurity:    Worry: Not  on file    Inability: Not on file  . Transportation needs:    Medical: Not on file    Non-medical: Not on file  Tobacco Use  . Smoking status: Never Smoker  . Smokeless tobacco: Never Used  Substance and Sexual Activity  . Alcohol use: No  . Drug use: No  . Sexual activity: Yes    Partners: Male    Birth control/protection: Post-menopausal  Lifestyle  . Physical activity:    Days per week: Not on file    Minutes per session: Not on file  . Stress: Not on file  Relationships  . Social connections:    Talks on phone: Not on file    Gets together: Not on file    Attends religious service: Not on file    Active member of club or organization: Not on file    Attends meetings of clubs or organizations: Not on file    Relationship status: Not on file  . Intimate partner violence:    Fear of current or ex partner: Not on file    Emotionally abused: Not on file    Physically abused: Not on file    Forced sexual activity: Not on file  Other Topics Concern  . Not on file  Social History Narrative   Married.   One son is 75, lives in [place].   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   Enjoys substitute teaching, gardening, cooking.   Review of Systems No vaginal products May have started after intercourse--doesn't use lubricant No vomiting or diarrhea Appetite is okay    Objective:   Physical Exam  GI: Soft. She exhibits no distension. There is no abdominal tenderness. There is no rebound and no guarding.  Musculoskeletal:     Comments: No CVA tenderness           Assessment & Plan:

## 2018-07-18 NOTE — Assessment & Plan Note (Signed)
Symptoms similar to last month May be related to coitus Last urine culture negative Will treat for 3 days (as long as symptoms go away quick) Try post coital antibiotic

## 2018-07-25 ENCOUNTER — Ambulatory Visit: Payer: Medicare Other

## 2018-08-03 ENCOUNTER — Ambulatory Visit: Payer: Medicare Other

## 2018-08-10 ENCOUNTER — Ambulatory Visit: Payer: Medicare Other

## 2018-08-17 ENCOUNTER — Ambulatory Visit: Payer: Medicare Other

## 2018-08-23 DIAGNOSIS — Z6831 Body mass index (BMI) 31.0-31.9, adult: Secondary | ICD-10-CM | POA: Diagnosis not present

## 2018-08-23 DIAGNOSIS — M48062 Spinal stenosis, lumbar region with neurogenic claudication: Secondary | ICD-10-CM | POA: Diagnosis not present

## 2018-08-24 ENCOUNTER — Ambulatory Visit (INDEPENDENT_AMBULATORY_CARE_PROVIDER_SITE_OTHER): Payer: Medicare Other

## 2018-08-24 DIAGNOSIS — E538 Deficiency of other specified B group vitamins: Secondary | ICD-10-CM

## 2018-08-24 MED ORDER — CYANOCOBALAMIN 1000 MCG/ML IJ SOLN
1000.0000 ug | Freq: Once | INTRAMUSCULAR | Status: AC
  Administered 2018-08-24: 1000 ug via INTRAMUSCULAR

## 2018-08-24 NOTE — Progress Notes (Signed)
Per orders of Regina Baity, NP, injection of vit B12 given by Dahlia Nifong. Patient tolerated injection well.  

## 2018-09-06 ENCOUNTER — Other Ambulatory Visit: Payer: Self-pay | Admitting: Cardiovascular Disease

## 2018-09-26 ENCOUNTER — Ambulatory Visit: Payer: Medicare Other

## 2018-09-28 ENCOUNTER — Ambulatory Visit: Payer: Medicare Other

## 2018-10-04 ENCOUNTER — Ambulatory Visit: Payer: Medicare Other

## 2018-10-10 ENCOUNTER — Telehealth: Payer: Self-pay

## 2018-10-10 DIAGNOSIS — M545 Low back pain: Secondary | ICD-10-CM | POA: Diagnosis not present

## 2018-10-10 NOTE — Telephone Encounter (Signed)
I left a message for the pt to call in regards to her Nurse visit tomorrow afternoon. I need to know if she wants to cancel for now or if she wants me to come out to her car.

## 2018-10-11 ENCOUNTER — Ambulatory Visit (INDEPENDENT_AMBULATORY_CARE_PROVIDER_SITE_OTHER): Payer: Medicare Other

## 2018-10-11 ENCOUNTER — Other Ambulatory Visit: Payer: Self-pay

## 2018-10-11 DIAGNOSIS — E538 Deficiency of other specified B group vitamins: Secondary | ICD-10-CM

## 2018-10-11 MED ORDER — CYANOCOBALAMIN 1000 MCG/ML IJ SOLN
1000.0000 ug | Freq: Once | INTRAMUSCULAR | Status: AC
  Administered 2018-10-11: 1000 ug via INTRAMUSCULAR

## 2018-10-11 NOTE — Progress Notes (Signed)
Pt received monthly B12 injection in Left Deltoid. Pt tolerated well.

## 2018-11-15 ENCOUNTER — Ambulatory Visit: Payer: Medicare Other

## 2018-11-15 ENCOUNTER — Other Ambulatory Visit: Payer: Self-pay

## 2018-11-22 ENCOUNTER — Ambulatory Visit: Payer: Medicare Other

## 2018-11-24 DIAGNOSIS — F3131 Bipolar disorder, current episode depressed, mild: Secondary | ICD-10-CM | POA: Diagnosis not present

## 2018-11-29 ENCOUNTER — Ambulatory Visit: Payer: Medicare Other

## 2018-12-12 DIAGNOSIS — M48062 Spinal stenosis, lumbar region with neurogenic claudication: Secondary | ICD-10-CM | POA: Diagnosis not present

## 2018-12-13 ENCOUNTER — Telehealth: Payer: Self-pay | Admitting: Primary Care

## 2018-12-13 NOTE — Telephone Encounter (Signed)
Error

## 2018-12-14 ENCOUNTER — Ambulatory Visit (INDEPENDENT_AMBULATORY_CARE_PROVIDER_SITE_OTHER): Payer: Medicare Other | Admitting: Primary Care

## 2018-12-14 ENCOUNTER — Encounter: Payer: Self-pay | Admitting: Primary Care

## 2018-12-14 DIAGNOSIS — R42 Dizziness and giddiness: Secondary | ICD-10-CM | POA: Insufficient documentation

## 2018-12-14 NOTE — Patient Instructions (Signed)
Stop losartan 50 mg tablets for blood pressure control.  Continue taking spirolactone and carvedilol twice daily for blood pressure.   Start monitoring your blood pressure daily, around the same time of day, for the next 2-3 weeks.  Ensure that you have rested for 30 minutes prior to checking your blood pressure. Record your readings and bring them to your next visit.  We will see you in 2 weeks as dicussed.  It was a pleasure to see you today! [name], NP-C

## 2018-12-14 NOTE — Progress Notes (Signed)
Subjective:    Patient ID: Katherine [name], female    DOB: January 02, 1951, 68 y.o.   MRN: [id number]  HPI  Virtual Visit via Video Note  I connected with Katherine [name] on 12/14/18 at 10:20 AM EDT by a video enabled telemedicine application and verified that I am speaking with the correct person using two identifiers.  Location: Patient: Home Provider: Office   I discussed the limitations of evaluation and management by telemedicine and the availability of in person appointments. The patient expressed understanding and agreed to proceed.  History of Present Illness:  Katherine [name] is a 68 year old female with a history of OSA, hypertension, chronic migraines, chronic back pain, prediabetes, hyperlipidemia, anemia, chronic fatigue who presents today with a chief complaint of dizziness.   She is currently managed on carvedilol 12.5 mg BID, spironolactone 25 mg, and losartan 50 mg for BP control. She is also managed on diazepam, baclofen for back pain and anxiety. She has a history of vitamin B 12 deficiency and is undergoing B12 injections monthly to every 2-3 months.   She is not checking her BP at home but is having it checked at the neurologists office,  last week her BP was 100/65. She experiences dizziness when changing positions from laying/sitting to standing only. She denies chest pain, shortness of breath, weakness, changes in speech. Dizziness began several months ago, she has fallen a few times.   BP Readings from Last 3 Encounters:  07/18/18 112/70  07/07/18 100/74  07/06/18 110/70      Observations/Objective:  Alert and oriented. Appears well, not sickly. No distress. Speaking in complete sentences.   Assessment and Plan:  Dizziness likely from orthostatic BP changes, but will also keep in mind other differentials. Low suspicion for CVA. We will stop her losartan for now. Continue spironolactone as she is taking this for BP control and hypokaliemia. Continue  carvedilol BID.  We will have her start monitoring home BP and plan to see her back in our office in 2 weeks for BP check and labs.  Consider checking B12, CBC, A1C, BMP during next visit. We will need to follow potassium levels since she will be off of losartan.   Follow Up Instructions:  Stop losartan 50 mg tablets for blood pressure control.  Continue taking spirolactone and carvedilol twice daily for blood pressure.   Start monitoring your blood pressure daily, around the same time of day, for the next 2-3 weeks.  Ensure that you have rested for 30 minutes prior to checking your blood pressure. Record your readings and bring them to your next visit.  We will see you in 2 weeks as dicussed.  It was a pleasure to see you today! [name], NP-C    I discussed the assessment and treatment plan with the patient. The patient was provided an opportunity to ask questions and all were answered. The patient agreed with the plan and demonstrated an understanding of the instructions.   The patient was advised to call back or seek an in-person evaluation if the symptoms worsen or if the condition fails to improve as anticipated.    [name], NP    Review of Systems  Eyes: Negative for visual disturbance.  Respiratory: Negative for shortness of breath.   Cardiovascular: Negative for chest pain.  Neurological: Positive for dizziness. Negative for weakness and headaches.       Past Medical History:  Diagnosis Date  . Anemia 2018  . Anxiety   .  Arthritis   . Borderline diabetes   . Chest pain    a. 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 Chicken pox    age 87  . Chronic back pain    Compressed discs. [place]  . Depression   . Dyspnea on exertion    a. 03/2009 Echo: EF 65%, no rwma, triv TR.  Marland Kitchen GERD (gastroesophageal reflux disease)   . High cholesterol   .  Hypertension   . Hypokalemia   . Migraine headache   . Obesity   . Pre-diabetes    okay now      Social History   Socioeconomic History  . Marital status: Married    Spouse name: Not on file  . Number of children: Not on file  . Years of education: Not on file  . Highest education level: Not on file  Occupational History  . Occupation: Product manager: [employer]  Social Needs  . Financial resource strain: Not on file  . Food insecurity:    Worry: Not on file    Inability: Not on file  . Transportation needs:    Medical: Not on file    Non-medical: Not on file  Tobacco Use  . Smoking status: Never Smoker  . Smokeless tobacco: Never Used  Substance and Sexual Activity  . Alcohol use: No  . Drug use: No  . Sexual activity: Yes    Partners: Male    Birth control/protection: Post-menopausal  Lifestyle  . Physical activity:    Days per week: Not on file    Minutes per session: Not on file  . Stress: Not on file  Relationships  . Social connections:    Talks on phone: Not on file    Gets together: Not on file    Attends religious service: Not on file    Active member of club or organization: Not on file    Attends meetings of clubs or organizations: Not on file    Relationship status: Not on file  . Intimate partner violence:    Fear of current or ex partner: Not on file    Emotionally abused: Not on file    Physically abused: Not on file    Forced sexual activity: Not on file  Other Topics Concern  . Not on file  Social History Narrative   Married.   One son is 41, lives in [place].   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   Enjoys substitute teaching, gardening, cooking.    Past Surgical History:  Procedure Laterality Date  . BLADDER SUSPENSION  2005  . CARDIAC CATHETERIZATION    . CESAREAN SECTION    . ENDOMETRIAL ABLATION  2007  . FOOT SURGERY     bilateral bunions  . LUMBAR LAMINECTOMY/DECOMPRESSION MICRODISCECTOMY Bilateral 01/12/2016    Procedure: Bilateral L4-5 Laminotomy/Foraminotomy;  Surgeon: Newman Pies, MD;  Location: [facility];  Service: Neurosurgery;  Laterality: Bilateral;  Bilateral L4-5 Laminotomy/Foraminotomy  . TONSILLECTOMY  1971    Family History  Problem Relation Age of Onset  . Heart disease Mother   . Hypertension Mother   . Heart failure Mother        CHF  . Diabetes Mother   . Cancer Mother        CERVICAL CANCER  . Heart disease Father   . Diabetes Father   . Liver disease Father   . Hypertension Father   .  Diabetes Sister   . Breast cancer Sister 85  . Heart disease Brother   . Heart failure Brother        CHF  . Diabetes Brother   . Cancer Brother 74       THROAT AND NECK  . Cancer Maternal Grandfather   . Colon cancer Neg Hx   . Stomach cancer Neg Hx   . Rectal cancer Neg Hx     Allergies  Allergen Reactions  . Codeine Anxiety  . Darvon Anxiety  . Hydrocodone Anxiety  . Meperidine Anxiety  . Oxycodone Anxiety  . Tramadol Anxiety  . Victoza [Liraglutide] Nausea Only and Other (See Comments)    And weakness    Current Outpatient Medications on File Prior to Visit  Medication Sig Dispense Refill  . baclofen (LIORESAL) 10 MG tablet TAKE 1 TABLET BY MOUTH TWICE A DAY AS NEEDED . LIMIT 1-2 TREATMENTS PER WEEK  0  . carvedilol (COREG) 12.5 MG tablet TAKE 1 TABLET (12.5 MG TOTAL) BY MOUTH 2 (TWO) TIMES DAILY. 180 tablet 3  . cetirizine (ZYRTEC) 10 MG tablet Take 10 mg by mouth daily as needed for allergies.    . diazepam (VALIUM) 10 MG tablet Take 10 mg by mouth at bedtime.  2  . DULoxetine (CYMBALTA) 30 MG capsule Take 5 capsules (150 mg total) by mouth daily. 150 capsule 0  . pantoprazole (PROTONIX) 40 MG tablet Take 1 tablet (40 mg total) by mouth daily. 90 tablet 3  . simvastatin (ZOCOR) 20 MG tablet TAKE 1 TABLET EVERY DAY 90 tablet 3  . spironolactone (ALDACTONE) 25 MG tablet TAKE 1 TABLET BY MOUTH TWICE A DAY 180 tablet 1  . sulfamethoxazole-trimethoprim (BACTRIM  DS,SEPTRA DS) 800-160 MG tablet Take 1 tablet by mouth 2 (two) times daily. 20 tablet 0  . topiramate (TOPAMAX) 100 MG tablet Take 1 tablet (100 mg total) by mouth daily. (Patient taking differently: Take 100 mg by mouth 2 (two) times daily. ) 90 tablet 3  . losartan (COZAAR) 50 MG tablet Take 1 tablet (50 mg total) by mouth daily. 90 tablet 3   No current facility-administered medications on file prior to visit.     There were no vitals taken for this visit.   Objective:   Physical Exam  Constitutional: She is oriented to person, place, and time. She appears well-nourished.  Respiratory: Effort normal.  Musculoskeletal:     Comments: Face symmetrical, no facial drooping. Speech clear.  Neurological: She is alert and oriented to person, place, and time.  Psychiatric: She has a normal mood and affect.           Assessment & Plan:

## 2018-12-14 NOTE — Assessment & Plan Note (Signed)
Dizziness likely from orthostatic BP changes, but will also keep in mind other differentials. Low suspicion for CVA. We will stop her losartan for now. Continue spironolactone as she is taking this for BP control and hypokaliemia. Continue carvedilol BID.  We will have her start monitoring home BP and plan to see her back in our office in 2 weeks for BP check and labs.  Consider checking B12, CBC, A1C, BMP during next visit. We will need to follow potassium levels since she will be off of losartan.

## 2018-12-21 ENCOUNTER — Ambulatory Visit (INDEPENDENT_AMBULATORY_CARE_PROVIDER_SITE_OTHER): Payer: Medicare Other

## 2018-12-21 DIAGNOSIS — E538 Deficiency of other specified B group vitamins: Secondary | ICD-10-CM | POA: Diagnosis not present

## 2018-12-21 MED ORDER — CYANOCOBALAMIN 1000 MCG/ML IJ SOLN
1000.0000 ug | Freq: Once | INTRAMUSCULAR | Status: AC
  Administered 2018-12-21: 1000 ug via INTRAMUSCULAR

## 2018-12-21 NOTE — Progress Notes (Signed)
Per orders of Webb Silversmith, NP covering for Gentry Fitz, NP as she is out of the office, injection of Vitamin B12 given by Diamond Nickel, RN administered to right deltoid IM.  Patient tolerated injection well.

## 2018-12-22 ENCOUNTER — Other Ambulatory Visit: Payer: Self-pay | Admitting: Cardiovascular Disease

## 2018-12-22 NOTE — Progress Notes (Signed)
Pt given B12 injection by CMA. B12 level from 04/2018 reviewed. Webb Silversmith, NP

## 2018-12-29 ENCOUNTER — Ambulatory Visit: Payer: Medicare Other | Admitting: Primary Care

## 2019-01-01 ENCOUNTER — Ambulatory Visit (INDEPENDENT_AMBULATORY_CARE_PROVIDER_SITE_OTHER): Payer: Medicare Other | Admitting: Primary Care

## 2019-01-01 ENCOUNTER — Encounter: Payer: Self-pay | Admitting: Primary Care

## 2019-01-01 ENCOUNTER — Other Ambulatory Visit: Payer: Self-pay

## 2019-01-01 VITALS — BP 108/70 | HR 89 | Temp 97.8°F | Ht 62.0 in | Wt 189.5 lb

## 2019-01-01 DIAGNOSIS — I10 Essential (primary) hypertension: Secondary | ICD-10-CM | POA: Diagnosis not present

## 2019-01-01 DIAGNOSIS — D649 Anemia, unspecified: Secondary | ICD-10-CM | POA: Diagnosis not present

## 2019-01-01 DIAGNOSIS — E538 Deficiency of other specified B group vitamins: Secondary | ICD-10-CM | POA: Diagnosis not present

## 2019-01-01 DIAGNOSIS — R42 Dizziness and giddiness: Secondary | ICD-10-CM | POA: Diagnosis not present

## 2019-01-01 DIAGNOSIS — R7303 Prediabetes: Secondary | ICD-10-CM | POA: Diagnosis not present

## 2019-01-01 NOTE — Patient Instructions (Signed)
Stop by the lab prior to leaving today. I will notify you of your results once received.   You can try taking Meclizine 12.5 mg tablets for dizziness. You can take this up to three times daily but be cautious as this may cause drowsiness.   Continue to monitor your blood pressure and notify me if you see readings below 100/60 or above 140/90.  It was a pleasure to see you today!

## 2019-01-01 NOTE — Assessment & Plan Note (Signed)
Repeat IBC panel and CBC pending.

## 2019-01-01 NOTE — Assessment & Plan Note (Signed)
Repeat A1C pending. 

## 2019-01-01 NOTE — Assessment & Plan Note (Signed)
Stable off of losartan, continue off. Continue carvedilol and spirolactone.  Repeat BMP pending.

## 2019-01-01 NOTE — Assessment & Plan Note (Addendum)
No change in dizziness symptoms with holding Losartan, BP is stable.   Differential diagnoses include anemia, vertigo, hyperglycemia, hypotension.   Check labs today including iron panel, CBC, BMP, A1C to rule out other causes. Orthostatic vitals today negative. Did encourage proper hydration with water.

## 2019-01-01 NOTE — Progress Notes (Signed)
Subjective:    Patient ID: Katherine [name], female    DOB: 1950/09/18, 68 y.o.   MRN: [id number]  HPI  Katherine [name] is a 68 year old female who presents today for follow up of hypertension.  She is currently managed on carvedilol 12.5 mg BID, spironolactone 25 mg BID.   She was last evaluated on 12/14/18 with reports of dizziness with positional changes. She denied dizziness at any other time except for with position changes so we decided to hold her Losartan and have her follow up today.  Since her last visit she doesn't feel as though her symptoms have improved. She doesn't feel worse. Her dizziness continues to occur with positional changes (raising from bending forward/leaning over, laying/sitting to standing, laying down flat in bed). Her dizziness will resolve within a few seconds of getting up and walking.  She describes her dizziness as "the room spinning". She's checking her BP at home and is getting readings of 120's/60's. Highest reading 134/68. She's not taken anything OTC for symptoms.   BP Readings from Last 3 Encounters:  01/01/19 108/70  07/18/18 112/70  07/07/18 100/74     Review of Systems  Constitutional: Positive for fatigue.  Respiratory: Negative for shortness of breath.   Cardiovascular: Negative for chest pain.  Skin: Negative for color change.  Neurological: Positive for dizziness.       Past Medical History:  Diagnosis Date  . Anemia 2018  . Anxiety   . Arthritis   . Borderline diabetes   . Chest pain    a. 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 Chicken pox    age 2  . Chronic back pain    Compressed discs. [place]  . Depression   . Dyspnea on exertion    a. 03/2009 Echo: EF 65%, no rwma, triv TR.  Marland Kitchen GERD (gastroesophageal reflux disease)   . High cholesterol   . Hypertension   . Hypokalemia   . Migraine headache   . Obesity   .  Pre-diabetes    okay now      Social History   Socioeconomic History  . Marital status: Married    Spouse name: Not on file  . Number of children: Not on file  . Years of education: Not on file  . Highest education level: Not on file  Occupational History  . Occupation: Product manager: [employer]  Social Needs  . Financial resource strain: Not on file  . Food insecurity    Worry: Not on file    Inability: Not on file  . Transportation needs    Medical: Not on file    Non-medical: Not on file  Tobacco Use  . Smoking status: Never Smoker  . Smokeless tobacco: Never Used  Substance and Sexual Activity  . Alcohol use: No  . Drug use: No  . Sexual activity: Yes    Partners: Male    Birth control/protection: Post-menopausal  Lifestyle  . Physical activity    Days per week: Not on file    Minutes per session: Not on file  . Stress: Not on file  Relationships  . Social Herbalist on phone: Not on file    Gets together: Not on file    Attends religious service: Not on file    Active member of club or organization: Not on file  Attends meetings of clubs or organizations: Not on file    Relationship status: Not on file  . Intimate partner violence    Fear of current or ex partner: Not on file    Emotionally abused: Not on file    Physically abused: Not on file    Forced sexual activity: Not on file  Other Topics Concern  . Not on file  Social History Narrative   Married.   One son is 59, lives in [place].   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   Enjoys substitute teaching, gardening, cooking.    Past Surgical History:  Procedure Laterality Date  . BLADDER SUSPENSION  2005  . CARDIAC CATHETERIZATION    . CESAREAN SECTION    . ENDOMETRIAL ABLATION  2007  . FOOT SURGERY     bilateral bunions  . LUMBAR LAMINECTOMY/DECOMPRESSION MICRODISCECTOMY Bilateral 01/12/2016   Procedure: Bilateral L4-5 Laminotomy/Foraminotomy;  Surgeon: Newman Pies, MD;   Location: [facility];  Service: Neurosurgery;  Laterality: Bilateral;  Bilateral L4-5 Laminotomy/Foraminotomy  . TONSILLECTOMY  1971    Family History  Problem Relation Age of Onset  . Heart disease Mother   . Hypertension Mother   . Heart failure Mother        CHF  . Diabetes Mother   . Cancer Mother        CERVICAL CANCER  . Heart disease Father   . Diabetes Father   . Liver disease Father   . Hypertension Father   . Diabetes Sister   . Breast cancer Sister 32  . Heart disease Brother   . Heart failure Brother        CHF  . Diabetes Brother   . Cancer Brother 36       THROAT AND NECK  . Cancer Maternal Grandfather   . Colon cancer Neg Hx   . Stomach cancer Neg Hx   . Rectal cancer Neg Hx     Allergies  Allergen Reactions  . Codeine Anxiety  . Darvon Anxiety  . Hydrocodone Anxiety  . Meperidine Anxiety  . Oxycodone Anxiety  . Tramadol Anxiety  . Victoza [Liraglutide] Nausea Only and Other (See Comments)    And weakness    Current Outpatient Medications on File Prior to Visit  Medication Sig Dispense Refill  . carvedilol (COREG) 12.5 MG tablet TAKE 1 TABLET (12.5 MG TOTAL) BY MOUTH 2 (TWO) TIMES DAILY. 180 tablet 3  . cetirizine (ZYRTEC) 10 MG tablet Take 10 mg by mouth daily as needed for allergies.    . diazepam (VALIUM) 10 MG tablet Take 10 mg by mouth at bedtime.  2  . DULoxetine (CYMBALTA) 30 MG capsule Take 5 capsules (150 mg total) by mouth daily. 150 capsule 0  . pantoprazole (PROTONIX) 40 MG tablet Take 1 tablet (40 mg total) by mouth daily. 90 tablet 3  . simvastatin (ZOCOR) 20 MG tablet TAKE 1 TABLET BY MOUTH EVERY DAY 30 tablet 0  . spironolactone (ALDACTONE) 25 MG tablet TAKE 1 TABLET BY MOUTH TWICE A DAY 180 tablet 1  . topiramate (TOPAMAX) 100 MG tablet Take 1 tablet (100 mg total) by mouth daily. (Patient taking differently: Take 100 mg by mouth 2 (two) times daily. ) 90 tablet 3   No current facility-administered medications on file prior to  visit.     BP 108/70   Pulse 89   Temp 97.8 F (36.6 C) (Oral)   Ht 5\' 2"  (1.575 m)   Wt 189 lb 8 oz (86 kg)  SpO2 97%   BMI 34.66 kg/m    Objective:   Physical Exam  Constitutional: She is oriented to person, place, and time. She appears well-nourished.  Eyes: EOM are normal.  Neck: Neck supple.  Cardiovascular: Normal rate and regular rhythm.  Respiratory: Effort normal and breath sounds normal.  Neurological: She is alert and oriented to person, place, and time. No cranial nerve deficit.  Skin: Skin is warm and dry.  Psychiatric: She has a normal mood and affect.           Assessment & Plan:

## 2019-01-02 LAB — BASIC METABOLIC PANEL
BUN: 17 mg/dL (ref 6–23)
CO2: 23 mEq/L (ref 19–32)
Calcium: 9 mg/dL (ref 8.4–10.5)
Chloride: 108 mEq/L (ref 96–112)
Creatinine, Ser: 0.83 mg/dL (ref 0.40–1.20)
GFR: 68.52 mL/min (ref >60.00)
Glucose, Bld: 67 mg/dL — ABNORMAL LOW (ref 70–99)
Potassium: 3.9 mEq/L (ref 3.5–5.1)
Sodium: 140 mEq/L (ref 135–145)

## 2019-01-02 LAB — CBC
HCT: 40.8 % (ref 36.0–46.0)
Hemoglobin: 12.8 g/dL (ref 12.0–15.0)
MCHC: 31.5 g/dL (ref 30.0–36.0)
MCV: 93 fl (ref 78.0–100.0)
Platelets: 346 10*3/uL (ref 150.0–400.0)
RBC: 4.39 Mil/uL (ref 3.87–5.11)
RDW: 13.9 % (ref 11.5–15.5)
WBC: 12.6 10*3/uL — ABNORMAL HIGH (ref 4.0–10.5)

## 2019-01-02 LAB — VITAMIN B12: Vitamin B-12: 315 pg/mL (ref 211–911)

## 2019-01-02 LAB — IBC + FERRITIN
Ferritin: 34.3 ng/mL (ref 10.0–291.0)
Iron: 32 ug/dL — ABNORMAL LOW (ref 42–145)
Saturation Ratios: 7.9 % — ABNORMAL LOW (ref 20.0–50.0)
Transferrin: 290 mg/dL (ref 212.0–360.0)

## 2019-01-02 LAB — HEMOGLOBIN A1C: Hgb A1c MFr Bld: 5.9 % (ref 4.6–6.5)

## 2019-01-03 DIAGNOSIS — H04123 Dry eye syndrome of bilateral lacrimal glands: Secondary | ICD-10-CM | POA: Diagnosis not present

## 2019-01-03 DIAGNOSIS — H2513 Age-related nuclear cataract, bilateral: Secondary | ICD-10-CM | POA: Diagnosis not present

## 2019-01-03 DIAGNOSIS — H52223 Regular astigmatism, bilateral: Secondary | ICD-10-CM | POA: Diagnosis not present

## 2019-01-03 DIAGNOSIS — H524 Presbyopia: Secondary | ICD-10-CM | POA: Diagnosis not present

## 2019-01-03 DIAGNOSIS — H5213 Myopia, bilateral: Secondary | ICD-10-CM | POA: Diagnosis not present

## 2019-01-08 ENCOUNTER — Other Ambulatory Visit: Payer: Self-pay | Admitting: Primary Care

## 2019-01-08 DIAGNOSIS — K219 Gastro-esophageal reflux disease without esophagitis: Secondary | ICD-10-CM

## 2019-01-16 ENCOUNTER — Other Ambulatory Visit: Payer: Self-pay | Admitting: Cardiovascular Disease

## 2019-01-17 ENCOUNTER — Telehealth: Payer: Self-pay | Admitting: Primary Care

## 2019-01-17 DIAGNOSIS — R42 Dizziness and giddiness: Secondary | ICD-10-CM

## 2019-01-17 NOTE — Telephone Encounter (Signed)
Pt called back to update you on dizziness. She said she is still falling, condition is not getting better. She fell yesterday and hurt her arm. She wants to know if she needs to set up appt for you or move forward with ENT referral.

## 2019-01-17 NOTE — Telephone Encounter (Signed)
Has she started taking oral iron yet? What's her blood pressure running?

## 2019-01-18 NOTE — Telephone Encounter (Signed)
Noted, please notify patient that I placed a referral to ENT. Someone should be in touch with her within 2 weeks.

## 2019-01-18 NOTE — Addendum Note (Signed)
Addended by: [name] on: 01/18/2019 04:59 PM   Modules accepted: Orders

## 2019-01-18 NOTE — Telephone Encounter (Signed)
Spoken and notified patient of Tawni Millers comments. Patient stated that yes, she is taking oral iron tablets. Her BP reading have been in 120's-100's over 80's-70's.

## 2019-01-22 NOTE — Telephone Encounter (Signed)
Message left for patient to return my call.  

## 2019-01-23 ENCOUNTER — Ambulatory Visit (INDEPENDENT_AMBULATORY_CARE_PROVIDER_SITE_OTHER): Payer: Medicare Other

## 2019-01-23 DIAGNOSIS — E538 Deficiency of other specified B group vitamins: Secondary | ICD-10-CM

## 2019-01-23 MED ORDER — CYANOCOBALAMIN 1000 MCG/ML IJ SOLN
1000.0000 ug | Freq: Once | INTRAMUSCULAR | Status: AC
  Administered 2019-01-23: 1000 ug via INTRAMUSCULAR

## 2019-01-23 NOTE — Progress Notes (Signed)
Pt received B12 injection in right deltoid due to left arm injury. Pt tolerated well.

## 2019-01-23 NOTE — Telephone Encounter (Signed)
Message left for patient to return my call.  

## 2019-01-31 ENCOUNTER — Encounter: Payer: Self-pay | Admitting: Radiology

## 2019-02-02 DIAGNOSIS — H903 Sensorineural hearing loss, bilateral: Secondary | ICD-10-CM | POA: Diagnosis not present

## 2019-02-02 DIAGNOSIS — R682 Dry mouth, unspecified: Secondary | ICD-10-CM | POA: Diagnosis not present

## 2019-02-02 DIAGNOSIS — R42 Dizziness and giddiness: Secondary | ICD-10-CM | POA: Diagnosis not present

## 2019-02-07 ENCOUNTER — Other Ambulatory Visit: Payer: Self-pay | Admitting: Cardiovascular Disease

## 2019-02-07 NOTE — Telephone Encounter (Signed)
Past due for f/u appointment .Please review for refill.  Thank you.

## 2019-02-08 ENCOUNTER — Other Ambulatory Visit: Payer: Self-pay | Admitting: Cardiovascular Disease

## 2019-02-08 NOTE — Telephone Encounter (Signed)
Please contact pt overdue for 12 month f/u last seen 10/2017. Pt needing refills.

## 2019-02-08 NOTE — Telephone Encounter (Signed)
No ans no vm to schedule

## 2019-02-13 NOTE — Telephone Encounter (Signed)
Patient has now been scheduled on 8/18 for a Virtual visit with Dr. Rockey Situ. She is still requesting a refill.   *STAT* If patient is at the pharmacy, call can be transferred to refill team.   1. Which medications need to be refilled? (please list name of each medication and dose if known) Carvedilol 12.5 mg   2. Which pharmacy/location (including street and city if local pharmacy) is medication to be sent to? [facility] [name]  3. Do they need a 30 day or 90 day supply? [place]

## 2019-02-13 NOTE — Telephone Encounter (Signed)
Please review for refill. LOV 4/19

## 2019-02-13 NOTE — Telephone Encounter (Signed)
Patient called office and requested that her my chart message be forwarded over to you to be addressed.

## 2019-02-14 ENCOUNTER — Encounter: Payer: Self-pay | Admitting: Primary Care

## 2019-02-14 ENCOUNTER — Telehealth: Payer: Self-pay | Admitting: Cardiovascular Disease

## 2019-02-14 ENCOUNTER — Ambulatory Visit (INDEPENDENT_AMBULATORY_CARE_PROVIDER_SITE_OTHER): Payer: Medicare Other | Admitting: Primary Care

## 2019-02-14 VITALS — Wt 189.0 lb

## 2019-02-14 DIAGNOSIS — R21 Rash and other nonspecific skin eruption: Secondary | ICD-10-CM | POA: Diagnosis not present

## 2019-02-14 DIAGNOSIS — M48062 Spinal stenosis, lumbar region with neurogenic claudication: Secondary | ICD-10-CM | POA: Diagnosis not present

## 2019-02-14 MED ORDER — PREDNISONE 10 MG PO TABS: ORAL_TABLET | ORAL | 0 refills | Status: DC

## 2019-02-14 NOTE — Telephone Encounter (Signed)
-----   Message from Alba Destine, [place] sent at 02/13/2019  2:37 PM EDT ----- Please schedule  an appointment for further refills.

## 2019-02-14 NOTE — Patient Instructions (Signed)
Start prednisone tablets. Take three tablets for 2 days, then two tablets for 2 days, then one tablet for 2 days.  Please update me if no improvement within 2 days.  It was a pleasure to see you today! [name], NP-C

## 2019-02-14 NOTE — Progress Notes (Signed)
Subjective:    Patient ID: Katherine [name], female    DOB: Aug 16, 1950, 68 y.o.   MRN: [id number]  HPI  Virtual Visit via Video Note  I connected with Katherine [name] on 02/14/19 at  2:40 PM EDT by a video enabled telemedicine application and verified that I am speaking with the correct person using two identifiers.  Location: Patient: Musician Provider: Office   I discussed the limitations of evaluation and management by telemedicine and the availability of in person appointments. The patient expressed understanding and agreed to proceed.  History of Present Illness:  Katherine [name] is a 68 year old female with a history of anemia, fatigue, prediabetes, OSA, migraines, hypertension who presents today with a chief complaint of rash.  Her rash is located to the forehead and bilateral cheeks that she first noticed nearly two weeks ago after wearing a surgical mask outdoors in the heat. She noticed quite a bit of swelling and redness to her face at that time. Since the she's noticed gradual improvement to her rash, but no resolve after a total of 12 days. The swelling has resolved and the redness has reduced.   She took 1 Benadryl at one point last week with some improvement in itching.  She also applied a clear cream that she had leftover from a prior prescription without much improvement.  She is not taking Zyrtec.  She denies shortness of breath, wheezing, throat closure.   Observations/Objective:  Alert and oriented. Appears well, not sickly. No distress. Speaking in complete sentences.  Pruritic rash noted to bilateral cheeks mostly.  Trace rash noticed to mid forehead proximal to nose and chin.  No swelling or widespread erythema.  Assessment and Plan:  See problem based charting  Follow Up Instructions:  Start prednisone tablets. Take three tablets for 2 days, then two tablets for 2 days, then one tablet for 2 days.  Please update me if no improvement within 2 days.   It was a pleasure to see you today! Katherine Bossier, NP-C    I discussed the assessment and treatment plan with the patient. The patient was provided an opportunity to ask questions and all were answered. The patient agreed with the plan and demonstrated an understanding of the instructions.   The patient was advised to call back or seek an in-person evaluation if the symptoms worsen or if the condition fails to improve as anticipated. She took one benadryl once last week for itching with some improvement.      Katherine Koch, NP    Review of Systems  HENT: Negative for trouble swallowing.   Respiratory: Negative for shortness of breath.   Skin: Positive for rash.       Past Medical History:  Diagnosis Date  . Anemia 2018  . Anxiety   . Arthritis   . Borderline diabetes   . Chest pain    a. 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 Chicken pox    age 55  . Chronic back pain    Compressed discs. [place]  . Depression   . Dyspnea on exertion    a. 03/2009 Echo: EF 65%, no rwma, triv TR.  Marland Kitchen GERD (gastroesophageal reflux disease)   . High cholesterol   . Hypertension   . Hypokalemia   . Migraine headache   . Obesity   . Pre-diabetes    okay now  Social History   Socioeconomic History  . Marital status: Married    Spouse name: Not on file  . Number of children: Not on file  . Years of education: Not on file  . Highest education level: Not on file  Occupational History  . Occupation: Product manager: [employer]  Social Needs  . Financial resource strain: Not on file  . Food insecurity    Worry: Not on file    Inability: Not on file  . Transportation needs    Medical: Not on file    Non-medical: Not on file  Tobacco Use  . Smoking status: Never Smoker  . Smokeless tobacco: Never Used  Substance and Sexual Activity  . Alcohol use: No  .  Drug use: No  . Sexual activity: Yes    Partners: Male    Birth control/protection: Post-menopausal  Lifestyle  . Physical activity    Days per week: Not on file    Minutes per session: Not on file  . Stress: Not on file  Relationships  . Social Herbalist on phone: Not on file    Gets together: Not on file    Attends religious service: Not on file    Active member of club or organization: Not on file    Attends meetings of clubs or organizations: Not on file    Relationship status: Not on file  . Intimate partner violence    Fear of current or ex partner: Not on file    Emotionally abused: Not on file    Physically abused: Not on file    Forced sexual activity: Not on file  Other Topics Concern  . Not on file  Social History Narrative   Married.   One son is 59, lives in [place].   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   Enjoys substitute teaching, gardening, cooking.    Past Surgical History:  Procedure Laterality Date  . BLADDER SUSPENSION  2005  . CARDIAC CATHETERIZATION    . CESAREAN SECTION    . ENDOMETRIAL ABLATION  2007  . FOOT SURGERY     bilateral bunions  . LUMBAR LAMINECTOMY/DECOMPRESSION MICRODISCECTOMY Bilateral 01/12/2016   Procedure: Bilateral L4-5 Laminotomy/Foraminotomy;  Surgeon: Newman Pies, MD;  Location: [facility];  Service: Neurosurgery;  Laterality: Bilateral;  Bilateral L4-5 Laminotomy/Foraminotomy  . TONSILLECTOMY  1971    Family History  Problem Relation Age of Onset  . Heart disease Mother   . Hypertension Mother   . Heart failure Mother        CHF  . Diabetes Mother   . Cancer Mother        CERVICAL CANCER  . Heart disease Father   . Diabetes Father   . Liver disease Father   . Hypertension Father   . Diabetes Sister   . Breast cancer Sister 66  . Heart disease Brother   . Heart failure Brother        CHF  . Diabetes Brother   . Cancer Brother 68       THROAT AND NECK  . Cancer Maternal Grandfather   . Colon cancer Neg  Hx   . Stomach cancer Neg Hx   . Rectal cancer Neg Hx     Allergies  Allergen Reactions  . Codeine Anxiety  . Darvon Anxiety  . Hydrocodone Anxiety  . Meperidine Anxiety  . Oxycodone Anxiety  . Tramadol Anxiety  . Victoza [Liraglutide] Nausea Only and Other (See Comments)  And weakness    Current Outpatient Medications on File Prior to Visit  Medication Sig Dispense Refill  . carvedilol (COREG) 12.5 MG tablet TAKE 1 TABLET (12.5 MG TOTAL) BY MOUTH 2 (TWO) TIMES DAILY. 60 tablet 0  . cetirizine (ZYRTEC) 10 MG tablet Take 10 mg by mouth daily as needed for allergies.    . diazepam (VALIUM) 10 MG tablet Take 10 mg by mouth at bedtime.  2  . DULoxetine (CYMBALTA) 30 MG capsule Take 5 capsules (150 mg total) by mouth daily. 150 capsule 0  . pantoprazole (PROTONIX) 40 MG tablet TAKE 1 TABLET BY MOUTH EVERY DAY 90 tablet 1  . simvastatin (ZOCOR) 20 MG tablet TAKE 1 TABLET BY MOUTH EVERY DAY 30 tablet 0  . spironolactone (ALDACTONE) 25 MG tablet TAKE 1 TABLET BY MOUTH TWICE A DAY 180 tablet 1  . topiramate (TOPAMAX) 100 MG tablet Take 1 tablet (100 mg total) by mouth daily. (Patient taking differently: Take 100 mg by mouth 2 (two) times daily. ) 90 tablet 3   No current facility-administered medications on file prior to visit.     Wt 189 lb (85.7 kg)   BMI 34.57 kg/m    Objective:   Physical Exam  Constitutional: She appears well-nourished.  HENT:  Head:    Respiratory: Effort normal.  Skin: Skin is warm and dry. Rash noted.  Pruritic rash noted to bilateral cheeks mostly.  Trace rash noticed to mid forehead proximal to nose and chin.  No swelling or widespread erythema.  Psychiatric: She has a normal mood and affect.           Assessment & Plan:

## 2019-02-14 NOTE — Assessment & Plan Note (Signed)
Appears to be allergic reaction, likely from surgical mask. No widespread swelling or erythema which is reassuring.  Discussed options for treatment including conservative treatment with Zyrtec and Pepcid versus prednisone course.  She opts for prednisone course which is reasonable considering that she has had no resolve in 12 days.  Low-dose prednisone taper sent to pharmacy, discussed potential side effects.  She will update.

## 2019-02-14 NOTE — Telephone Encounter (Signed)
Patient is scheduled for 8/18 with Dr.Gollan for a virtual visit

## 2019-02-15 DIAGNOSIS — Z23 Encounter for immunization: Secondary | ICD-10-CM

## 2019-02-16 ENCOUNTER — Telehealth: Payer: Self-pay | Admitting: Primary Care

## 2019-02-16 DIAGNOSIS — R42 Dizziness and giddiness: Secondary | ICD-10-CM | POA: Diagnosis not present

## 2019-02-16 MED ORDER — ZOSTER VAC RECOMB ADJUVANTED 50 MCG/0.5ML IM SUSR: 0.5000 mL | Freq: Once | INTRAMUSCULAR | 1 refills | Status: AC

## 2019-02-16 NOTE — Telephone Encounter (Signed)
Pt called to request pneumonia shot to be given at the 8/11 B-12 visit. Pt she can be advised at the 8/11 nurse visit if she is due.

## 2019-02-20 NOTE — Telephone Encounter (Signed)
Patient stated that she is okay with this and will wait.

## 2019-02-20 NOTE — Telephone Encounter (Signed)
Noted. Pneumonia vaccine can wait until physical in 06/2019

## 2019-02-22 ENCOUNTER — Other Ambulatory Visit: Payer: Self-pay | Admitting: Unknown Physician Specialty

## 2019-02-22 ENCOUNTER — Telehealth: Payer: Self-pay | Admitting: Primary Care

## 2019-02-22 DIAGNOSIS — R42 Dizziness and giddiness: Secondary | ICD-10-CM

## 2019-02-22 NOTE — Telephone Encounter (Signed)
Patient certainly can receive here but will need to sign an out of pocket expense responsibility waiver.   She will need to understand that the vaccine is approximately 200-275.00 per dose (including admin fee) and will receive a series of 2 injections.  Total cost general range is between 450-600 dollars.    If she is willing to accept this type of expense, we are more than happy to provide her with the vaccine.  I am sorry to hear that she doesn't have the insurance coverage.    I will forward this to Robin to reach out to patient with information and get her scheduled if agreeable.   Thanks.

## 2019-02-22 NOTE — Telephone Encounter (Signed)
Thank you :)

## 2019-02-22 NOTE — Telephone Encounter (Signed)
error 

## 2019-02-22 NOTE — Telephone Encounter (Signed)
Spoke with pt.  She is aware of mandys comments and cost of vaccine.  She schedule her appointment same day as her b12 inj.  She declined to schedule 2nd shingrix she stated she would schedule later.  She is aware she would need to get 2nd inj 61 days to 6 month after 1st inj

## 2019-02-22 NOTE — Telephone Encounter (Signed)
Pt called to request Shingrix at [facility]. She called her Medicare-they will not cover- she does not have part D. She would like to receive this at 8/11 B-12 [place].

## 2019-02-22 NOTE — Telephone Encounter (Signed)
Katherine [name],  Is this pt eligible to receive here since Medicare will not cover anyway?

## 2019-02-27 ENCOUNTER — Ambulatory Visit (INDEPENDENT_AMBULATORY_CARE_PROVIDER_SITE_OTHER): Payer: Medicare Other

## 2019-02-27 ENCOUNTER — Other Ambulatory Visit: Payer: Self-pay

## 2019-02-27 DIAGNOSIS — E538 Deficiency of other specified B group vitamins: Secondary | ICD-10-CM

## 2019-02-27 DIAGNOSIS — Z23 Encounter for immunization: Secondary | ICD-10-CM

## 2019-02-27 MED ORDER — CYANOCOBALAMIN 1000 MCG/ML IJ SOLN
1000.0000 ug | Freq: Once | INTRAMUSCULAR | Status: AC
  Administered 2019-02-27: 1000 ug via INTRAMUSCULAR

## 2019-02-27 NOTE — Progress Notes (Signed)
1. Pt received monthly B12 injection in left deltoid. Pt tolerated well.  2. Pt received her 1st Shingrix vaccine per 02-22-19 telephone note. Pt signed a Waiver of Liability Form. I have sent it up front to be scanned in.

## 2019-03-02 ENCOUNTER — Other Ambulatory Visit: Payer: Self-pay | Admitting: Cardiovascular Disease

## 2019-03-03 ENCOUNTER — Other Ambulatory Visit: Payer: Self-pay | Admitting: Cardiovascular Disease

## 2019-03-05 NOTE — Progress Notes (Signed)
Virtual Visit via Telephone Note   This visit type was conducted due to national recommendations for restrictions regarding the COVID-19 Pandemic (e.g. social distancing) in an effort to limit this patient's exposure and mitigate transmission in our community.  Due to her co-morbid illnesses, this patient is at least at moderate risk for complications without adequate follow up.  This format is felt to be most appropriate for this patient at this time.  The patient did not have access to video technology/had technical difficulties with video requiring transitioning to audio format only (telephone).  All issues noted in this document were discussed and addressed.  No physical exam could be performed with this format.  Please refer to the patient's chart for her  consent to telehealth for [facility].   I connected with  [name] on 03/06/19 by a video enabled telemedicine application and verified that I am speaking with the correct person using two identifiers. I discussed the limitations of evaluation and management by telemedicine. The patient expressed understanding and agreed to proceed.   Evaluation Performed:  Follow-up visit  Date:  03/06/2019   ID:  Cylie, Dor 1951-07-07, MRN [id number]  Patient Location:  [place] [id number]   Provider location:   [facility], [place] office  PCP:  [name], NP  Cardiologist:  Patsy Baltimore   Chief Complaint:  Dizzy, falls    History of Present Illness:    ALISI LUPIEN is a 68 y.o. female who presents via audio/video conferencing for a telehealth visit today.   The patient does not symptoms concerning for COVID-19 infection (fever, chills, cough, or new SHORTNESS OF BREATH).   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  Back pain Previously started on spironolactone for low potassium   March 2019  echoand stress test reviewed with her in detail showed EF 65% no signifcant valvular disease.  No ischemia on stress   back surgery with Dr. Arnoldo Morale, disc bulge 2    Prior CV studies:   The following studies were reviewed today:    Past Medical History:  Diagnosis Date  . Anemia 2018  . Anxiety   . Arthritis   . Borderline diabetes   . Chest pain    a. 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 Chicken pox    age 74  . Chronic back pain    Compressed discs. [place]  . Depression   . Dyspnea on exertion    a. 03/2009 Echo: EF 65%, no rwma, triv TR.  Marland Kitchen GERD (gastroesophageal reflux disease)   . High cholesterol   . Hypertension   . Hypokalemia   . Migraine headache   . Obesity   .  Pre-diabetes    okay now    Past Surgical History:  Procedure Laterality Date  . BLADDER SUSPENSION  2005  . CARDIAC CATHETERIZATION    . CESAREAN SECTION    . ENDOMETRIAL ABLATION  2007  . FOOT SURGERY     bilateral bunions  . LUMBAR LAMINECTOMY/DECOMPRESSION MICRODISCECTOMY Bilateral 01/12/2016   Procedure: Bilateral L4-5 Laminotomy/Foraminotomy;  Surgeon: Newman Pies, MD;  Location: [place];  Service: Neurosurgery;  Laterality: Bilateral;  Bilateral L4-5 Laminotomy/Foraminotomy  . TONSILLECTOMY  1971       Allergies:   Codeine, Darvon, Hydrocodone, Meperidine, Oxycodone, Tramadol, and Victoza [liraglutide]   Social History   Tobacco Use  . Smoking status: Never Smoker  . Smokeless tobacco: Never Used  Substance Use Topics  . Alcohol use: No  . Drug use: No     Current Outpatient Medications on File Prior to Visit  Medication Sig Dispense Refill  . carvedilol (COREG) 12.5 MG tablet TAKE 1 TABLET (12.5 MG TOTAL) BY MOUTH 2 (TWO) TIMES DAILY. 60 tablet 0  . cetirizine (ZYRTEC) 10 MG tablet Take 10 mg by mouth daily as needed for allergies.    . diazepam (VALIUM) 10 MG tablet Take 10 mg by mouth at bedtime.  2  . DULoxetine (CYMBALTA) 30 MG capsule Take 5 capsules (150 mg total) by mouth daily. 150 capsule 0  . pantoprazole (PROTONIX) 40 MG tablet TAKE 1 TABLET BY MOUTH EVERY DAY 90 tablet 1  . simvastatin (ZOCOR) 20 MG tablet TAKE 1 TABLET BY MOUTH EVERY DAY 30 tablet 0  . spironolactone (ALDACTONE) 25 MG tablet TAKE 1 TABLET BY MOUTH TWICE A DAY 180 tablet 0  . topiramate (TOPAMAX) 100 MG tablet Take 1 tablet (100 mg total) by mouth daily. (Patient taking differently: Take 100 mg by mouth 2 (two) times daily. ) 90 tablet 3   No current facility-administered medications on file prior to visit.      Family Hx: The patient's family history includes Breast cancer (age of onset: 70) in her sister; Cancer in her maternal grandfather and mother; Cancer (age of onset: 59) in her brother; Diabetes in her brother, father, mother, and sister; Heart disease in her brother, father, and mother; Heart failure in her brother and mother; Hypertension in her father and mother; Liver disease in her father. There is no history of Colon cancer, Stomach cancer, or Rectal cancer.  ROS:   Please see the history of present illness.    Review of Systems  Constitutional: Negative.   HENT: Negative.   Respiratory: Negative.   Cardiovascular: Negative.   Gastrointestinal: Negative.   Musculoskeletal:  Negative.   Neurological: Positive for dizziness.  Psychiatric/Behavioral: Negative.   All other systems reviewed and are negative.     Labs/Other Tests and Data Reviewed:    Recent Labs: 03/13/2018: TSH 1.93 07/06/2018: ALT 9 01/01/2019: BUN 17; Creatinine, Ser 0.83; Hemoglobin 12.8; Platelets 346.0; Potassium 3.9; Sodium 140   Recent Lipid Panel Lab Results  Component Value Date/Time   CHOL 174 07/06/2018 01:02 PM   CHOL 123 06/22/2013 08:09 AM   TRIG 169.0 (H) 07/06/2018 01:02 PM   HDL 51.30 07/06/2018 01:02 PM   HDL 48 06/22/2013 08:09 AM   CHOLHDL 3 07/06/2018 01:02 PM   LDLCALC 89 07/06/2018 01:02 PM   LDLCALC 60 06/22/2013 08:09 AM   LDLDIRECT 92.0 05/10/2018 11:10 AM    Wt Readings from Last 3 Encounters:  03/06/19 175 lb (79.4 kg)  02/14/19 189 lb (85.7 kg)  01/01/19 189 lb 8 oz (86 kg)     Exam:    Vital Signs: Vital signs may also be detailed in the HPI Ht 5\' 2"  (1.575 m)   Wt 175 lb (79.4 kg)   BMI 32.01 kg/m   Wt Readings from Last 3 Encounters:  03/06/19 175 lb (79.4 kg)  02/14/19 189 lb (85.7 kg)  01/01/19 189 lb 8 oz (86 kg)   Temp Readings from Last 3 Encounters:  01/01/19 97.8 F (36.6 C) (Oral)  07/18/18 (!) 97.3 F (36.3 C) (Oral)  07/07/18 (!) 97.5 F (36.4 C) (Oral)   BP Readings from Last 3 Encounters:  01/01/19 108/70  07/18/18 112/70  07/07/18 100/74   Pulse Readings from Last 3 Encounters:  01/01/19 89  07/18/18 87  07/07/18 87     Well nourished, well developed female in no acute distress. Constitutional:  oriented to person, place, and time. No distress.  Head: Normocephalic and atraumatic.  Eyes:  no discharge. No scleral icterus.  Neck: Normal range of motion. Neck supple.  Pulmonary/Chest: No audible wheezing, no distress, appears comfortable Musculoskeletal: Normal range of motion.  no  tenderness or deformity.  Neurological:   Coordination normal. Full exam not performed Skin:  No rash Psychiatric:  normal mood  and affect. behavior is normal. Thought content normal.    ASSESSMENT & PLAN:    Problem List Items Addressed This Visit      Cardiology Problems   HYPERTENSION, BENIGN   Hyperlipidemia     Other   Prediabetes   Obstructive sleep apnea    Other Visit Diagnoses    Chest pain, unspecified type    -  Primary   DOE (dyspnea on exertion)         Dizziness Etiology unclear, concerned about low blood pressure and orthostasis We will decrease the coreg down to 6.25 BID Monitor blood pressure and call in 2 to 3 weeks  Falls Unsteady gait, Suggested a cane Has an MRI today, head  Tachycardia We will decrease the carvedilol, she will call us if tachycardia symptoms recur Previously have been well controlled on carvedilol 12.5 but I am concerned about low blood pressure   COVID-19 Education: The signs and symptoms of COVID-19 were discussed with the patient and how to seek care for testing (follow up with PCP or arrange E-visit).  The importance of social distancing was discussed today.  Patient Risk:   After full review of this patients clinical status, I feel that they are at least moderate risk at this time.  Time:   Today, I have spent 25 minutes with the patient with telehealth technology discussing the cardiac and medical problems/diagnoses detailed above   Additional 10 min spent reviewing the chart prior to patient visit today   Medication Adjustments/Labs and Tests Ordered: Current medicines are reviewed at length with the patient today.  Concerns regarding medicines are outlined above.   Tests Ordered: No tests ordered   Medication Changes: No changes made   Disposition: Follow-up in 6 months   Signed, Ida Rogue, MD  [place] Office [address], [place], [place] [address]

## 2019-03-06 ENCOUNTER — Ambulatory Visit
Admission: RE | Admit: 2019-03-06 | Discharge: 2019-03-06 | Disposition: A | Discharge status: Home / Self Care | Payer: Medicare Other | Source: Ambulatory Visit | Attending: Unknown Physician Specialty | Admitting: Unknown Physician Specialty

## 2019-03-06 ENCOUNTER — Other Ambulatory Visit: Payer: Self-pay

## 2019-03-06 ENCOUNTER — Telehealth (INDEPENDENT_AMBULATORY_CARE_PROVIDER_SITE_OTHER): Payer: Medicare Other | Admitting: Cardiovascular Disease

## 2019-03-06 VITALS — Ht 62.0 in | Wt 175.0 lb

## 2019-03-06 DIAGNOSIS — I10 Essential (primary) hypertension: Secondary | ICD-10-CM

## 2019-03-06 DIAGNOSIS — I479 Paroxysmal tachycardia, unspecified: Secondary | ICD-10-CM

## 2019-03-06 DIAGNOSIS — R2681 Unsteadiness on feet: Secondary | ICD-10-CM | POA: Diagnosis not present

## 2019-03-06 DIAGNOSIS — R000 Tachycardia, unspecified: Secondary | ICD-10-CM

## 2019-03-06 DIAGNOSIS — R079 Chest pain, unspecified: Secondary | ICD-10-CM | POA: Diagnosis not present

## 2019-03-06 DIAGNOSIS — E785 Hyperlipidemia, unspecified: Secondary | ICD-10-CM

## 2019-03-06 DIAGNOSIS — R0600 Dyspnea, unspecified: Secondary | ICD-10-CM | POA: Diagnosis not present

## 2019-03-06 DIAGNOSIS — S0990XA Unspecified injury of head, initial encounter: Secondary | ICD-10-CM | POA: Diagnosis not present

## 2019-03-06 DIAGNOSIS — E782 Mixed hyperlipidemia: Secondary | ICD-10-CM

## 2019-03-06 DIAGNOSIS — R7303 Prediabetes: Secondary | ICD-10-CM | POA: Diagnosis not present

## 2019-03-06 DIAGNOSIS — R42 Dizziness and giddiness: Secondary | ICD-10-CM | POA: Insufficient documentation

## 2019-03-06 DIAGNOSIS — G4733 Obstructive sleep apnea (adult) (pediatric): Secondary | ICD-10-CM | POA: Diagnosis not present

## 2019-03-06 DIAGNOSIS — R0609 Other forms of dyspnea: Secondary | ICD-10-CM

## 2019-03-06 LAB — POCT I-STAT CREATININE: Creatinine, Ser: 0.9 mg/dL (ref 0.44–1.00)

## 2019-03-06 MED ORDER — CARVEDILOL 6.25 MG PO TABS: 6.2500 mg | ORAL_TABLET | Freq: Two times a day (BID) | ORAL | 3 refills | Status: DC

## 2019-03-06 MED ORDER — GADOBUTROL 1 MMOL/ML IV SOLN
9.0000 mL | Freq: Once | INTRAVENOUS | Status: AC | PRN
  Administered 2019-03-06: 7.5 mL via INTRAVENOUS

## 2019-03-06 NOTE — Patient Instructions (Addendum)
Medication Instructions:  Your physician has recommended you make the following change in your medication:  1. DECREASE Carvedilol to 6.25 mg twice a day BP running  Low, dizzy  If you need a refill on your cardiac medications before your next appointment, please call your pharmacy.    Lab work: No new labs needed   If you have labs (blood work) drawn today and your tests are completely normal, you will receive your results only by: Marland Kitchen MyChart Message (if you have MyChart) OR . A paper copy in the mail If you have any lab test that is abnormal or we need to change your treatment, we will call you to review the results.   Testing/Procedures: No new testing needed   Follow-Up: At [facility], you and your health needs are our priority.  As part of our continuing mission to provide you with exceptional heart care, we have created designated Provider Care Teams.  These Care Teams include your primary Cardiologist (physician) and Advanced Practice Providers (APPs -  Physician Assistants and Nurse Practitioners) who all work together to provide you with the care you need, when you need it.  . You will need a follow up appointment in 12 months .   Please call our office 2 months in advance to schedule this appointment.    . Providers on your designated Care Team:   . Murray Hodgkins, NP . Christell Faith, PA-C . Marrianne Mood, PA-C  Any Other Special Instructions Will Be Listed Below (If Applicable).  For educational health videos Log in to : [email or web address] Or : [email or web address], password : [email or web address]

## 2019-03-08 ENCOUNTER — Other Ambulatory Visit: Payer: Self-pay | Admitting: Cardiovascular Disease

## 2019-03-08 DIAGNOSIS — L659 Nonscarring hair loss, unspecified: Secondary | ICD-10-CM | POA: Diagnosis not present

## 2019-03-08 DIAGNOSIS — L719 Rosacea, unspecified: Secondary | ICD-10-CM | POA: Diagnosis not present

## 2019-03-08 DIAGNOSIS — L814 Other melanin hyperpigmentation: Secondary | ICD-10-CM | POA: Diagnosis not present

## 2019-04-04 ENCOUNTER — Ambulatory Visit: Payer: Medicare Other

## 2019-04-05 ENCOUNTER — Ambulatory Visit: Payer: Medicare Other

## 2019-04-18 ENCOUNTER — Ambulatory Visit: Payer: Medicare Other

## 2019-04-21 ENCOUNTER — Other Ambulatory Visit: Payer: Self-pay | Admitting: Internal Medicine

## 2019-04-23 MED ORDER — SULFAMETHOXAZOLE-TRIMETHOPRIM 800-160 MG PO TABS: 1.0000 | ORAL_TABLET | Freq: Once | ORAL | 2 refills | Status: AC

## 2019-04-24 DIAGNOSIS — F332 Major depressive disorder, recurrent severe without psychotic features: Secondary | ICD-10-CM | POA: Diagnosis not present

## 2019-05-03 ENCOUNTER — Ambulatory Visit: Payer: Medicare Other

## 2019-05-03 ENCOUNTER — Other Ambulatory Visit: Payer: Self-pay

## 2019-05-03 ENCOUNTER — Telehealth: Payer: Self-pay | Admitting: Primary Care

## 2019-05-03 DIAGNOSIS — Z20822 Contact with and (suspected) exposure to covid-19: Secondary | ICD-10-CM

## 2019-05-03 NOTE — Telephone Encounter (Signed)
Noted  

## 2019-05-03 NOTE — Telephone Encounter (Signed)
Patient is going today to be covid testing due to her not feeling well. She was advised of testing sites and times. Also advised to self quarantine. Pending results she will be called on what to do next.

## 2019-05-05 LAB — NOVEL CORONAVIRUS, NAA: SARS-CoV-2, NAA: NOT DETECTED

## 2019-05-08 ENCOUNTER — Ambulatory Visit: Payer: Medicare Other

## 2019-05-11 ENCOUNTER — Telehealth: Payer: Self-pay | Admitting: *Deleted

## 2019-05-11 NOTE — Telephone Encounter (Signed)
Left message on voicemail for patient to call back. When patient calls back need to advise her of curbside nurse visit 05/16/19. Confirm negative covid screening.

## 2019-05-14 ENCOUNTER — Other Ambulatory Visit: Payer: Self-pay | Admitting: Primary Care

## 2019-05-14 DIAGNOSIS — Z1231 Encounter for screening mammogram for malignant neoplasm of breast: Secondary | ICD-10-CM

## 2019-05-16 ENCOUNTER — Ambulatory Visit (INDEPENDENT_AMBULATORY_CARE_PROVIDER_SITE_OTHER): Payer: Medicare Other

## 2019-05-16 DIAGNOSIS — Z23 Encounter for immunization: Secondary | ICD-10-CM

## 2019-05-16 DIAGNOSIS — E538 Deficiency of other specified B group vitamins: Secondary | ICD-10-CM | POA: Diagnosis not present

## 2019-05-16 MED ORDER — CYANOCOBALAMIN 1000 MCG/ML IJ SOLN
1000.0000 ug | Freq: Once | INTRAMUSCULAR | Status: AC
  Administered 2019-05-16: 1000 ug via INTRAMUSCULAR

## 2019-05-16 NOTE — Progress Notes (Signed)
Per orders of Alma Friendly, NP injection of monthly B12 given by Kris Mouton. Patient tolerated injection well.  Also given Flu shot

## 2019-05-18 ENCOUNTER — Telehealth: Payer: Self-pay | Admitting: Primary Care

## 2019-05-18 NOTE — Telephone Encounter (Signed)
Patient would like to know if she is due to have her pneumonia vaccine.

## 2019-05-20 ENCOUNTER — Other Ambulatory Visit: Payer: Self-pay | Admitting: Primary Care

## 2019-05-20 DIAGNOSIS — K219 Gastro-esophageal reflux disease without esophagitis: Secondary | ICD-10-CM

## 2019-05-21 NOTE — Telephone Encounter (Signed)
Please notify patient that most adults have two pneumonia vaccinations after the age of 26. Has she ever had the Prevnar 13 pneumonia vaccination? If not then she could get that one during her CPE in December 2020.

## 2019-05-21 NOTE — Telephone Encounter (Signed)
Please advise 

## 2019-05-22 NOTE — Telephone Encounter (Addendum)
Spoken and notified patient's husband (on [place]) of Katherine [name] comments. Patient's husband verbalized understanding and will let patient's know.

## 2019-05-28 ENCOUNTER — Other Ambulatory Visit: Payer: Self-pay | Admitting: Primary Care

## 2019-05-28 ENCOUNTER — Ambulatory Visit: Payer: Medicare Other | Admitting: Psychology

## 2019-05-28 DIAGNOSIS — G43709 Chronic migraine without aura, not intractable, without status migrainosus: Secondary | ICD-10-CM

## 2019-05-29 ENCOUNTER — Other Ambulatory Visit: Payer: Self-pay | Admitting: Cardiovascular Disease

## 2019-05-29 DIAGNOSIS — I10 Essential (primary) hypertension: Secondary | ICD-10-CM | POA: Diagnosis not present

## 2019-05-29 DIAGNOSIS — M545 Low back pain: Secondary | ICD-10-CM | POA: Diagnosis not present

## 2019-05-29 DIAGNOSIS — Z6834 Body mass index (BMI) 34.0-34.9, adult: Secondary | ICD-10-CM | POA: Diagnosis not present

## 2019-05-29 NOTE — Telephone Encounter (Signed)
Is she taking this once daily or twice daily? I will send refills once we clarify.

## 2019-05-29 NOTE — Telephone Encounter (Signed)
Last filled on 03/13/2018 for 1 year worth of refills. LOV 02/14/2019 for acute visit. Next appointment for CPE on 07/11/2019

## 2019-05-30 NOTE — Telephone Encounter (Signed)
Psychiatrist is prescribing, please send.

## 2019-05-30 NOTE — Telephone Encounter (Signed)
Message left for patient to return my call.  

## 2019-05-30 NOTE — Telephone Encounter (Signed)
Patient returned Chan's call.  Patient takes 1 x a day.

## 2019-05-30 NOTE — Telephone Encounter (Signed)
Patient called back and stating that.  Taking 1 tablet in morning and 2 tablet at night.

## 2019-06-05 NOTE — Telephone Encounter (Signed)
Noted. Spoke to patient last week and she will contact.

## 2019-06-06 DIAGNOSIS — M48061 Spinal stenosis, lumbar region without neurogenic claudication: Secondary | ICD-10-CM | POA: Diagnosis not present

## 2019-06-06 DIAGNOSIS — M545 Low back pain: Secondary | ICD-10-CM | POA: Diagnosis not present

## 2019-06-06 DIAGNOSIS — M5126 Other intervertebral disc displacement, lumbar region: Secondary | ICD-10-CM | POA: Diagnosis not present

## 2019-06-07 DIAGNOSIS — R200 Anesthesia of skin: Secondary | ICD-10-CM | POA: Diagnosis not present

## 2019-06-07 DIAGNOSIS — R202 Paresthesia of skin: Secondary | ICD-10-CM | POA: Diagnosis not present

## 2019-06-07 DIAGNOSIS — R278 Other lack of coordination: Secondary | ICD-10-CM | POA: Diagnosis not present

## 2019-06-11 ENCOUNTER — Ambulatory Visit (INDEPENDENT_AMBULATORY_CARE_PROVIDER_SITE_OTHER): Payer: Medicare Other | Admitting: Psychology

## 2019-06-11 DIAGNOSIS — F332 Major depressive disorder, recurrent severe without psychotic features: Secondary | ICD-10-CM

## 2019-06-12 ENCOUNTER — Ambulatory Visit (INDEPENDENT_AMBULATORY_CARE_PROVIDER_SITE_OTHER): Payer: Medicare Other | Admitting: *Deleted

## 2019-06-12 DIAGNOSIS — Z23 Encounter for immunization: Secondary | ICD-10-CM | POA: Diagnosis not present

## 2019-06-12 NOTE — Progress Notes (Signed)
Per orders of [name], NP injection of Shingrix given by Lauralyn Primes. Patient tolerated injection well.

## 2019-06-13 ENCOUNTER — Ambulatory Visit: Payer: Medicare Other

## 2019-06-21 ENCOUNTER — Ambulatory Visit (INDEPENDENT_AMBULATORY_CARE_PROVIDER_SITE_OTHER): Payer: Medicare Other

## 2019-06-21 ENCOUNTER — Other Ambulatory Visit: Payer: Self-pay

## 2019-06-21 DIAGNOSIS — E538 Deficiency of other specified B group vitamins: Secondary | ICD-10-CM

## 2019-06-21 MED ORDER — CYANOCOBALAMIN 1000 MCG/ML IJ SOLN
1000.0000 ug | Freq: Once | INTRAMUSCULAR | Status: AC
  Administered 2019-06-21: 1000 ug via INTRAMUSCULAR

## 2019-06-21 NOTE — Progress Notes (Signed)
Per orders of NP, Alma Friendly, injection of vit [id number] given by Brenton Grills. Patient tolerated injection well.

## 2019-06-25 ENCOUNTER — Ambulatory Visit: Payer: Medicare Other | Admitting: Psychology

## 2019-07-05 ENCOUNTER — Telehealth: Payer: Self-pay

## 2019-07-05 NOTE — Telephone Encounter (Signed)
LVM to call clinic, pt needs COVID screen back, front door and lab info 12.17.2020 TLJ

## 2019-07-06 ENCOUNTER — Other Ambulatory Visit: Payer: Self-pay | Admitting: Primary Care

## 2019-07-06 DIAGNOSIS — I10 Essential (primary) hypertension: Secondary | ICD-10-CM

## 2019-07-06 DIAGNOSIS — D649 Anemia, unspecified: Secondary | ICD-10-CM

## 2019-07-06 DIAGNOSIS — E538 Deficiency of other specified B group vitamins: Secondary | ICD-10-CM

## 2019-07-06 DIAGNOSIS — R7303 Prediabetes: Secondary | ICD-10-CM

## 2019-07-06 DIAGNOSIS — E785 Hyperlipidemia, unspecified: Secondary | ICD-10-CM

## 2019-07-06 DIAGNOSIS — E559 Vitamin D deficiency, unspecified: Secondary | ICD-10-CM

## 2019-07-09 ENCOUNTER — Ambulatory Visit: Payer: Medicare Other

## 2019-07-09 ENCOUNTER — Ambulatory Visit (INDEPENDENT_AMBULATORY_CARE_PROVIDER_SITE_OTHER): Payer: Medicare Other

## 2019-07-09 ENCOUNTER — Other Ambulatory Visit (INDEPENDENT_AMBULATORY_CARE_PROVIDER_SITE_OTHER): Payer: Medicare Other

## 2019-07-09 ENCOUNTER — Other Ambulatory Visit: Payer: Self-pay

## 2019-07-09 DIAGNOSIS — E785 Hyperlipidemia, unspecified: Secondary | ICD-10-CM

## 2019-07-09 DIAGNOSIS — D649 Anemia, unspecified: Secondary | ICD-10-CM

## 2019-07-09 DIAGNOSIS — E538 Deficiency of other specified B group vitamins: Secondary | ICD-10-CM | POA: Diagnosis not present

## 2019-07-09 DIAGNOSIS — E559 Vitamin D deficiency, unspecified: Secondary | ICD-10-CM

## 2019-07-09 DIAGNOSIS — I10 Essential (primary) hypertension: Secondary | ICD-10-CM | POA: Diagnosis not present

## 2019-07-09 DIAGNOSIS — R7303 Prediabetes: Secondary | ICD-10-CM | POA: Diagnosis not present

## 2019-07-09 DIAGNOSIS — Z0000 Encounter for general adult medical examination without abnormal findings: Secondary | ICD-10-CM | POA: Diagnosis not present

## 2019-07-09 LAB — COMPREHENSIVE METABOLIC PANEL
ALT: 9 U/L (ref 0–35)
AST: 12 U/L (ref 0–37)
Albumin: 4.1 g/dL (ref 3.5–5.2)
Alkaline Phosphatase: 115 U/L (ref 39–117)
BUN: 21 mg/dL (ref 6–23)
CO2: 20 mEq/L (ref 19–32)
Calcium: 9.2 mg/dL (ref 8.4–10.5)
Chloride: 109 mEq/L (ref 96–112)
Creatinine, Ser: 0.89 mg/dL (ref 0.40–1.20)
GFR: 63.12 mL/min (ref >60.00)
Glucose, Bld: 165 mg/dL — ABNORMAL HIGH (ref 70–99)
Potassium: 3.6 mEq/L (ref 3.5–5.1)
Sodium: 140 mEq/L (ref 135–145)
Total Bilirubin: 0.3 mg/dL (ref 0.2–1.2)
Total Protein: 7 g/dL (ref 6.0–8.3)

## 2019-07-09 LAB — LIPID PANEL
Cholesterol: 200 mg/dL (ref 0–200)
HDL: 48.4 mg/dL (ref >39.00)
LDL Cholesterol: 118 mg/dL — ABNORMAL HIGH (ref 0–99)
NonHDL: 151.53
Total CHOL/HDL Ratio: 4
Triglycerides: 169 mg/dL — ABNORMAL HIGH (ref 0.0–149.0)
VLDL: 33.8 mg/dL (ref 0.0–40.0)

## 2019-07-09 LAB — CBC
HCT: 37.8 % (ref 36.0–46.0)
Hemoglobin: 11.5 g/dL — ABNORMAL LOW (ref 12.0–15.0)
MCHC: 30.4 g/dL (ref 30.0–36.0)
MCV: 81.8 fl (ref 78.0–100.0)
Platelets: 359 10*3/uL (ref 150.0–400.0)
RBC: 4.62 Mil/uL (ref 3.87–5.11)
RDW: 17.8 % — ABNORMAL HIGH (ref 11.5–15.5)
WBC: 17.5 10*3/uL — ABNORMAL HIGH (ref 4.0–10.5)

## 2019-07-09 LAB — VITAMIN B12: Vitamin B-12: 377 pg/mL (ref 211–911)

## 2019-07-09 LAB — VITAMIN D 25 HYDROXY (VIT D DEFICIENCY, FRACTURES): VITD: 41.26 ng/mL (ref 30.00–100.00)

## 2019-07-09 LAB — HEMOGLOBIN A1C: Hgb A1c MFr Bld: 6.3 % (ref 4.6–6.5)

## 2019-07-09 NOTE — Patient Instructions (Signed)
Katherine [name] , Thank you for taking time to come for your Medicare Wellness Visit. I appreciate your ongoing commitment to your health goals. Please review the following plan we discussed and let me know if I can assist you in the future.   Screening recommendations/referrals: Colonoscopy: Up to date, completed 04/22/2017 Mammogram: Up to date, completed 06/01/2018 Bone Density: Up to date, completed 06/01/2018 Recommended yearly ophthalmology/optometry visit for glaucoma screening and checkup Recommended yearly dental visit for hygiene and checkup  Vaccinations: Influenza vaccine: Up to date, completed 05/16/2019 Pneumococcal vaccine: will get at physical Tdap vaccine: Up to date, completed 12/10/2014 Shingles vaccine: Completed series    Advanced directives: Advance directive discussed with you today. Even though you declined this today please call our office should you change your mind and we can give you the proper paperwork for you to fill out.   Conditions/risks identified: hypertension, hyperlipidemia  Next appointment: 07/11/2019 @ 11 am    Preventive Care 65 Years and Older, Female Preventive care refers to lifestyle choices and visits with your health care provider that can promote health and wellness. What does preventive care include?  A yearly physical exam. This is also called an annual well check.  Dental exams once or twice a year.  Routine eye exams. Ask your health care provider how often you should have your eyes checked.  Personal lifestyle choices, including:  Daily care of your teeth and gums.  Regular physical activity.  Eating a healthy diet.  Avoiding tobacco and drug use.  Limiting alcohol use.  Practicing safe sex.  Taking low-dose aspirin every day.  Taking vitamin and mineral supplements as recommended by your health care provider. What happens during an annual well check? The services and screenings done by your health care provider  during your annual well check will depend on your age, overall health, lifestyle risk factors, and family history of disease. Counseling  Your health care provider may ask you questions about your:  Alcohol use.  Tobacco use.  Drug use.  Emotional well-being.  Home and relationship well-being.  Sexual activity.  Eating habits.  History of falls.  Memory and ability to understand (cognition).  Work and work Statistician.  Reproductive health. Screening  You may have the following tests or measurements:  Height, weight, and BMI.  Blood pressure.  Lipid and cholesterol levels. These may be checked every 5 years, or more frequently if you are over 9 years old.  Skin check.  Lung cancer screening. You may have this screening every year starting at age 58 if you have a 30-pack-year history of smoking and currently smoke or have quit within the past 15 years.  Fecal occult blood test (FOBT) of the stool. You may have this test every year starting at age 86.  Flexible sigmoidoscopy or colonoscopy. You may have a sigmoidoscopy every 5 years or a colonoscopy every 10 years starting at age 17.  Hepatitis C blood test.  Hepatitis B blood test.  Sexually transmitted disease (STD) testing.  Diabetes screening. This is done by checking your blood sugar (glucose) after you have not eaten for a while (fasting). You may have this done every 1-3 years.  Bone density scan. This is done to screen for osteoporosis. You may have this done starting at age 9.  Mammogram. This may be done every 1-2 years. Talk to your health care provider about how often you should have regular mammograms. Talk with your health care provider about your test results, treatment options,  and if necessary, the need for more tests. Vaccines  Your health care provider may recommend certain vaccines, such as:  Influenza vaccine. This is recommended every year.  Tetanus, diphtheria, and acellular pertussis  (Tdap, Td) vaccine. You may need a Td booster every 10 years.  Zoster vaccine. You may need this after age 51.  Pneumococcal 13-valent conjugate (PCV13) vaccine. One dose is recommended after age 3.  Pneumococcal polysaccharide (PPSV23) vaccine. One dose is recommended after age 39. Talk to your health care provider about which screenings and vaccines you need and how often you need them. This information is not intended to replace advice given to you by your health care provider. Make sure you discuss any questions you have with your health care provider. Document Released: 08/01/2015 Document Revised: 03/24/2016 Document Reviewed: 05/06/2015 Elsevier Interactive Patient Education  2017 [place] Prevention in the Home Falls can cause injuries. They can happen to people of all ages. There are many things you can do to make your home safe and to help prevent falls. What can I do on the outside of my home?  Regularly fix the edges of walkways and driveways and fix any cracks.  Remove anything that might make you trip as you walk through a door, such as a raised step or threshold.  Trim any bushes or trees on the path to your home.  Use bright outdoor lighting.  Clear any walking paths of anything that might make someone trip, such as rocks or tools.  Regularly check to see if handrails are loose or broken. Make sure that both sides of any steps have handrails.  Any raised decks and porches should have guardrails on the edges.  Have any leaves, snow, or ice cleared regularly.  Use sand or salt on walking paths during winter.  Clean up any spills in your garage right away. This includes oil or grease spills. What can I do in the bathroom?  Use night lights.  Install grab bars by the toilet and in the tub and shower. Do not use towel bars as grab bars.  Use non-skid mats or decals in the tub or shower.  If you need to sit down in the shower, use a plastic, non-slip  stool.  Keep the floor dry. Clean up any water that spills on the floor as soon as it happens.  Remove soap buildup in the tub or shower regularly.  Attach bath mats securely with double-sided non-slip rug tape.  Do not have throw rugs and other things on the floor that can make you trip. What can I do in the bedroom?  Use night lights.  Make sure that you have a light by your bed that is easy to reach.  Do not use any sheets or blankets that are too big for your bed. They should not hang down onto the floor.  Have a firm chair that has side arms. You can use this for support while you get dressed.  Do not have throw rugs and other things on the floor that can make you trip. What can I do in the kitchen?  Clean up any spills right away.  Avoid walking on wet floors.  Keep items that you use a lot in easy-to-reach places.  If you need to reach something above you, use a strong step stool that has a grab bar.  Keep electrical cords out of the way.  Do not use floor polish or wax that makes floors slippery. If  you must use wax, use non-skid floor wax.  Do not have throw rugs and other things on the floor that can make you trip. What can I do with my stairs?  Do not leave any items on the stairs.  Make sure that there are handrails on both sides of the stairs and use them. Fix handrails that are broken or loose. Make sure that handrails are as long as the stairways.  Check any carpeting to make sure that it is firmly attached to the stairs. Fix any carpet that is loose or worn.  Avoid having throw rugs at the top or bottom of the stairs. If you do have throw rugs, attach them to the floor with carpet tape.  Make sure that you have a light switch at the top of the stairs and the bottom of the stairs. If you do not have them, ask someone to add them for you. What else can I do to help prevent falls?  Wear shoes that:  Do not have high heels.  Have rubber bottoms.  Are  comfortable and fit you well.  Are closed at the toe. Do not wear sandals.  If you use a stepladder:  Make sure that it is fully opened. Do not climb a closed stepladder.  Make sure that both sides of the stepladder are locked into place.  Ask someone to hold it for you, if possible.  Clearly mark and make sure that you can see:  Any grab bars or handrails.  First and last steps.  Where the edge of each step is.  Use tools that help you move around (mobility aids) if they are needed. These include:  Canes.  Walkers.  Scooters.  Crutches.  Turn on the lights when you go into a dark area. Replace any light bulbs as soon as they burn out.  Set up your furniture so you have a clear path. Avoid moving your furniture around.  If any of your floors are uneven, fix them.  If there are any pets around you, be aware of where they are.  Review your medicines with your doctor. Some medicines can make you feel dizzy. This can increase your chance of falling. Ask your doctor what other things that you can do to help prevent falls. This information is not intended to replace advice given to you by your health care provider. Make sure you discuss any questions you have with your health care provider. Document Released: 05/01/2009 Document Revised: 12/11/2015 Document Reviewed: 08/09/2014 Elsevier Interactive Patient Education  2017 [employer].

## 2019-07-09 NOTE — Progress Notes (Signed)
Subjective:   DENIZ CERULLO is a 68 y.o. female who presents for Medicare Annual (Subsequent) preventive examination.  Review of Systems: N/A   This visit is being conducted through telemedicine via telephone at the nurse health advisor's home address due to the COVID-19 pandemic. This patient has given me verbal consent via doximity to conduct this visit, patient states they are participating from their home address. Patient and myself are on the telephone call. There is no referral for this visit. Some vital signs may be absent or patient reported.    Patient identification: identified by name, DOB, and current address   Cardiac Risk Factors include: advanced age (>33men, >72 women);dyslipidemia;hypertension     Objective:     Vitals: There were no vitals taken for this visit.  There is no height or weight on file to calculate BMI.  Advanced Directives 07/09/2019 07/06/2018 04/22/2017 07/17/2016 07/07/2016 01/12/2016 01/09/2016  Does Patient Have a Medical Advance Directive? No No No No No No No  Would patient like information on creating a medical advance directive? No - Patient declined No - Patient declined - Yes (MAU/Ambulatory/Procedural Areas - Information given) Yes (MAU/Ambulatory/Procedural Areas - Information given) No - patient declined information Yes Higher education careers adviser given    Tobacco Social History   Tobacco Use  Smoking Status Never Smoker  Smokeless Tobacco Never Used     Counseling given: Not Answered   Clinical Intake:  Pre-visit preparation completed: Yes  Pain : 0-10 Pain Score: 8  Pain Type: Chronic pain Pain Location: Back Pain Orientation: Lower Pain Descriptors / Indicators: Aching Pain Onset: More than a month ago Pain Frequency: Intermittent     Nutritional Risks: None Diabetes: No  How often do you need to have someone help you when you read instructions, pamphlets, or other written materials from your doctor or pharmacy?: 1  - Never What is the last grade level you completed in school?: college degree  Interpreter Needed?: No  Information entered by :: CJohnson, LPN  Past Medical History:  Diagnosis Date  . Anemia 2018  . Anxiety   . Arthritis   . Borderline diabetes   . Chest pain    a. 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 Chicken pox    age 41  . Chronic back pain    Compressed discs. [place]  . Depression   . Dyspnea on exertion    a. 03/2009 Echo: EF 65%, no rwma, triv TR.  Marland Kitchen GERD (gastroesophageal reflux disease)   . High cholesterol   . Hypertension   . Hypokalemia   . Migraine headache   . Obesity   . Pre-diabetes    okay now    Past Surgical History:  Procedure Laterality Date  . BLADDER SUSPENSION  2005  . CARDIAC CATHETERIZATION    . CESAREAN SECTION    . ENDOMETRIAL ABLATION  2007  . FOOT SURGERY     bilateral bunions  . LUMBAR LAMINECTOMY/DECOMPRESSION MICRODISCECTOMY Bilateral 01/12/2016   Procedure: Bilateral L4-5 Laminotomy/Foraminotomy;  Surgeon: Newman Pies, MD;  Location: [facility];  Service: Neurosurgery;  Laterality: Bilateral;  Bilateral L4-5 Laminotomy/Foraminotomy  . TONSILLECTOMY  1971   Family History  Problem Relation Age of Onset  . Heart disease Mother   . Hypertension Mother   . Heart failure Mother        CHF  . Diabetes Mother   . Cancer Mother  CERVICAL CANCER  . Heart disease Father   . Diabetes Father   . Liver disease Father   . Hypertension Father   . Diabetes Sister   . Breast cancer Sister 33  . Heart disease Brother   . Heart failure Brother        CHF  . Diabetes Brother   . Cancer Brother 25       THROAT AND NECK  . Cancer Maternal Grandfather   . Colon cancer Neg Hx   . Stomach cancer Neg Hx   . Rectal cancer Neg Hx    Social History   Socioeconomic History  . Marital status: Married    Spouse name: Not on  file  . Number of children: Not on file  . Years of education: Not on file  . Highest education level: Not on file  Occupational History  . Occupation: Product manager: [employer]  Tobacco Use  . Smoking status: Never Smoker  . Smokeless tobacco: Never Used  Substance and Sexual Activity  . Alcohol use: No  . Drug use: No  . Sexual activity: Yes    Partners: Male    Birth control/protection: Post-menopausal  Other Topics Concern  . Not on file  Social History Narrative   Married.   One son is 55, lives in [place].   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   Enjoys substitute teaching, gardening, cooking.   Social Determinants of Health   Financial Resource Strain: Low Risk   . Difficulty of Paying Living Expenses: Not hard at all  Food Insecurity: No Food Insecurity  . Worried About Charity fundraiser in the Last Year: Never true  . Ran Out of Food in the Last Year: Never true  Transportation Needs: No Transportation Needs  . Lack of Transportation (Medical): No  . Lack of Transportation (Non-Medical): No  Physical Activity: Inactive  . Days of Exercise per Week: 0 days  . Minutes of Exercise per Session: 0 min  Stress: Stress Concern Present  . Feeling of Stress : To some extent  Social Connections:   . Frequency of Communication with Friends and Family: Not on file  . Frequency of Social Gatherings with Friends and Family: Not on file  . Attends Religious Services: Not on file  . Active Member of Clubs or Organizations: Not on file  . Attends Archivist Meetings: Not on file  . Marital Status: Not on file    Outpatient Encounter Medications as of 07/09/2019  Medication Sig  . buPROPion (WELLBUTRIN XL) 150 MG 24 hr tablet Take 150 mg by mouth every morning.  . carvedilol (COREG) 6.25 MG tablet Take 1 tablet (6.25 mg total) by mouth 2 (two) times daily.  . cetirizine (ZYRTEC) 10 MG tablet Take 10 mg by mouth daily as needed for allergies.  . diazepam  (VALIUM) 10 MG tablet Take 10 mg by mouth at bedtime.  . DULoxetine (CYMBALTA) 30 MG capsule Take 5 capsules (150 mg total) by mouth daily.  . pantoprazole (PROTONIX) 40 MG tablet TAKE 1 TABLET BY MOUTH EVERY DAY  . simvastatin (ZOCOR) 20 MG tablet TAKE 1 TABLET BY MOUTH EVERY DAY  . spironolactone (ALDACTONE) 25 MG tablet TAKE 1 TABLET BY MOUTH TWICE A DAY  . topiramate (TOPAMAX) 100 MG tablet Take 1 tablet (100 mg total) by mouth daily. (Patient taking differently: Take 100 mg by mouth 2 (two) times daily. )   No facility-administered encounter medications on file as of 07/09/2019.  Activities of Daily Living In your present state of health, do you have any difficulty performing the following activities: 07/09/2019  Hearing? Y  Comment needs hearing aids  Vision? Y  Comment vision is very poor per patient  Difficulty concentrating or making decisions? Y  Comment forgetting things more frequently  Walking or climbing stairs? Y  Comment has shortness of breath  Dressing or bathing? N  Doing errands, shopping? N  Preparing Food and eating ? N  Using the Toilet? N  In the past six months, have you accidently leaked urine? N  Do you have problems with loss of bowel control? N  Managing your Medications? N  Managing your Finances? N  Housekeeping or managing your Housekeeping? N  Some recent data might be hidden    Patient Care Team: [name], NP as PCP - General (Nurse Practitioner) Minna Merritts, MD as PCP - Cardiology (Cardiology) Minna Merritts, MD as Consulting Physician (Cardiology)    Assessment:   This is a routine wellness examination for Bernard.  Exercise Activities and Dietary recommendations Current Exercise Habits: The patient does not participate in regular exercise at present, Exercise limited by: None identified  Goals    . Patient Stated     Starting 07/20/2018, I will attempt to drink 2 bottles of water daily and to walk for 30 minutes 3-5  days per week.     . Patient Stated     07/09/2019, I will work on trying to improve the pain in my back by exercising better.        Fall Risk Fall Risk  07/09/2019 07/06/2018  Falls in the past year? 1 0  Comment walking and just fell -  Number falls in past yr: 1 -  Injury with Fall? 0 -  Risk for fall due to : History of fall(s);Medication side effect -  Follow up Falls evaluation completed;Falls prevention discussed -   Is the patient's home free of loose throw rugs in walkways, pet beds, electrical cords, etc?   yes      Grab bars in the bathroom? no      Handrails on the stairs?   no      Adequate lighting?   yes  Timed Get Up and Go performed: N/A  Depression Screen PHQ 2/9 Scores 07/09/2019 07/06/2018  PHQ - 2 Score 6 0  PHQ- 9 Score 9 0     Cognitive Function MMSE - Mini Mental State Exam 07/09/2019 07/06/2018  Orientation to time 5 5  Orientation to Place 5 5  Registration 3 3  Attention/ Calculation 3 0  Recall 2 3  Language- name 2 objects - 0  Language- repeat 1 1  Language- follow 3 step command - 3  Language- read & follow direction - 0  Write a sentence - 0  Copy design - 0  Total score - 20  Mini Cog  Mini-Cog screen was completed. Maximum score is 22. A value of 0 denotes this part of the MMSE was not completed or the patient failed this part of the Mini-Cog screening.       Immunization History  Administered Date(s) Administered  . Fluad Quad(high Dose 65+) 05/16/2019  . Influenza,inj,Quad PF,6+ Mos 05/09/2015, 05/07/2016, 03/30/2017, 05/10/2018  . Influenza-Unspecified 05/01/2014  . Pneumococcal Polysaccharide-23 12/10/2014, 07/06/2018  . Td 12/10/2014  . Zoster 04/12/2009  . Zoster Recombinat (Shingrix) 02/27/2019, 06/12/2019    Qualifies for Shingles Vaccine? Completed series   Screening Tests Health Maintenance  Topic Date Due  . PNA vac Low Risk Adult (2 of 2 - PCV13) 07/07/2019  . MAMMOGRAM  06/01/2020  . COLONOSCOPY   04/22/2022  . TETANUS/TDAP  12/09/2024  . INFLUENZA VACCINE  Completed  . DEXA SCAN  Completed  . Hepatitis C Screening  Completed    Cancer Screenings: Lung: Low Dose CT Chest recommended if Age 78-80 years, 30 pack-year currently smoking OR have quit w/in 15years. Patient does not qualify. Breast:  Up to date on Mammogram? Yes, completed 06/01/2018  Up to date of Bone Density/Dexa? Yes, completed 06/01/2018 Colorectal: completed 04/22/2017  Additional Screenings:  Hepatitis C Screening: 07/06/2018     Plan:    Patient will work on trying to improve the pain in her back by exercising better.   I have personally reviewed and noted the following in the patient's chart:   . Medical and social history . Use of alcohol, tobacco or illicit drugs  . Current medications and supplements . Functional ability and status . Nutritional status . Physical activity . Advanced directives . List of other physicians . Hospitalizations, surgeries, and ER visits in previous 12 months . Vitals . Screenings to include cognitive, depression, and falls . Referrals and appointments  In addition, I have reviewed and discussed with patient certain preventive protocols, quality metrics, and best practice recommendations. A written personalized care plan for preventive services as well as general preventive health recommendations were provided to patient.     Andrez Grime, LPN  [id number]

## 2019-07-09 NOTE — Progress Notes (Signed)
PCP notes:  Health Maintenance: Needs prevnar 13 vaccine    Abnormal Screenings: MMSE score 19   Patient concerns: Patient is having trouble with back pain.    Nurse concerns: none   Next PCP appt.: 07/11/2019 @ 11 am

## 2019-07-11 ENCOUNTER — Telehealth: Payer: Self-pay | Admitting: Primary Care

## 2019-07-11 ENCOUNTER — Other Ambulatory Visit: Payer: Self-pay

## 2019-07-11 ENCOUNTER — Ambulatory Visit (INDEPENDENT_AMBULATORY_CARE_PROVIDER_SITE_OTHER): Payer: Medicare Other | Admitting: Primary Care

## 2019-07-11 ENCOUNTER — Encounter: Payer: Self-pay | Admitting: Primary Care

## 2019-07-11 VITALS — BP 122/70 | HR 84 | Temp 95.7°F | Ht 62.0 in | Wt 197.5 lb

## 2019-07-11 DIAGNOSIS — G43709 Chronic migraine without aura, not intractable, without status migrainosus: Secondary | ICD-10-CM

## 2019-07-11 DIAGNOSIS — E559 Vitamin D deficiency, unspecified: Secondary | ICD-10-CM

## 2019-07-11 DIAGNOSIS — K219 Gastro-esophageal reflux disease without esophagitis: Secondary | ICD-10-CM

## 2019-07-11 DIAGNOSIS — G20 Parkinson's disease: Secondary | ICD-10-CM | POA: Insufficient documentation

## 2019-07-11 DIAGNOSIS — Z1231 Encounter for screening mammogram for malignant neoplasm of breast: Secondary | ICD-10-CM | POA: Diagnosis not present

## 2019-07-11 DIAGNOSIS — F329 Major depressive disorder, single episode, unspecified: Secondary | ICD-10-CM

## 2019-07-11 DIAGNOSIS — M8580 Other specified disorders of bone density and structure, unspecified site: Secondary | ICD-10-CM

## 2019-07-11 DIAGNOSIS — E782 Mixed hyperlipidemia: Secondary | ICD-10-CM

## 2019-07-11 DIAGNOSIS — I10 Essential (primary) hypertension: Secondary | ICD-10-CM

## 2019-07-11 DIAGNOSIS — R296 Repeated falls: Secondary | ICD-10-CM

## 2019-07-11 DIAGNOSIS — D649 Anemia, unspecified: Secondary | ICD-10-CM

## 2019-07-11 DIAGNOSIS — F32A Depression, unspecified: Secondary | ICD-10-CM

## 2019-07-11 DIAGNOSIS — R410 Disorientation, unspecified: Secondary | ICD-10-CM | POA: Diagnosis not present

## 2019-07-11 DIAGNOSIS — G20A1 Parkinson's disease without dyskinesia, without mention of fluctuations: Secondary | ICD-10-CM | POA: Insufficient documentation

## 2019-07-11 DIAGNOSIS — D72829 Elevated white blood cell count, unspecified: Secondary | ICD-10-CM | POA: Diagnosis not present

## 2019-07-11 DIAGNOSIS — R7303 Prediabetes: Secondary | ICD-10-CM

## 2019-07-11 DIAGNOSIS — M4316 Spondylolisthesis, lumbar region: Secondary | ICD-10-CM

## 2019-07-11 DIAGNOSIS — R42 Dizziness and giddiness: Secondary | ICD-10-CM

## 2019-07-11 LAB — POC URINALSYSI DIPSTICK (AUTOMATED)
Bilirubin, UA: NEGATIVE
Blood, UA: NEGATIVE
Glucose, UA: NEGATIVE
Nitrite, UA: NEGATIVE
Protein, UA: POSITIVE — AB
Spec Grav, UA: 1.025 (ref 1.010–1.025)
Urobilinogen, UA: 0.2 E.U./dL
pH, UA: 6 (ref 5.0–8.0)

## 2019-07-11 LAB — CBC WITH DIFFERENTIAL/PLATELET
Basophils Absolute: 0.1 10*3/uL (ref 0.0–0.1)
Basophils Relative: 0.9 % (ref 0.0–3.0)
Eosinophils Absolute: 0.6 10*3/uL (ref 0.0–0.7)
Eosinophils Relative: 7.9 % — ABNORMAL HIGH (ref 0.0–5.0)
HCT: 36.3 % (ref 36.0–46.0)
Hemoglobin: 11.3 g/dL — ABNORMAL LOW (ref 12.0–15.0)
Lymphocytes Relative: 24.6 % (ref 12.0–46.0)
Lymphs Abs: 1.9 10*3/uL (ref 0.7–4.0)
MCHC: 31.1 g/dL (ref 30.0–36.0)
MCV: 81.1 fl (ref 78.0–100.0)
Monocytes Absolute: 0.7 10*3/uL (ref 0.1–1.0)
Monocytes Relative: 8.4 % (ref 3.0–12.0)
Neutro Abs: 4.6 10*3/uL (ref 1.4–7.7)
Neutrophils Relative %: 58.2 % (ref 43.0–77.0)
Platelets: 345 10*3/uL (ref 150.0–400.0)
RBC: 4.48 Mil/uL (ref 3.87–5.11)
RDW: 17.5 % — ABNORMAL HIGH (ref 11.5–15.5)
WBC: 7.9 10*3/uL (ref 4.0–10.5)

## 2019-07-11 NOTE — Telephone Encounter (Signed)
Please call Dr. Trena Platt office. Is there a reason we order the nerve conduction study if they mentioned it as part of their plan during her last visit in November? In care everywhere they mentioned completing nerve conduction studies as well as PT and vestibular rehab. Am I reading this incorrectly?  Do they need for me to put in a referral back to their office?

## 2019-07-11 NOTE — Assessment & Plan Note (Signed)
Doing very well on daily topiramate for migraine prevention, continue same.

## 2019-07-11 NOTE — Assessment & Plan Note (Signed)
The 10-year ASCVD risk score Mikey Bussing DC Brooke Bonito., et al., 2013) is: 8.7%   Values used to calculate the score:     Age: 68 years     Sex: Female     Is Non-Hispanic African American: No     Diabetic: No     Tobacco smoker: No     Systolic Blood Pressure: 123XX123 mmHg     Is BP treated: Yes     HDL Cholesterol: 48.4 mg/dL     Total Cholesterol: 200 mg/dL  Compliant to statin therapy, recent LDL of 118. Didn't have time to discuss this with patient today given other ongoing issues.  Consider switch to atorvastatin 20-40 mg for better lipid control given history of prediabetes and hypertension. No CAD from cardiac catheterization in 2007. Echo in 2019 with normal function, stress test without ischemia. Will get in touch with her cardiologist.

## 2019-07-11 NOTE — Assessment & Plan Note (Signed)
WBC count of 17.5 on recent labs which is not usual. UA today with 1+ leuks, otherwise not convincing. Culture pending.  Repeat CBC pending. No recent steroid use/injections.

## 2019-07-11 NOTE — Telephone Encounter (Signed)
Patient called back.  She stated she is also needing order so she can have her esophagus stretched  Patient does not remember the doctor who done her endoscopy but knows it was at [facility]

## 2019-07-11 NOTE — Assessment & Plan Note (Signed)
Chronic and continued on recent labs. Discussed the importance of a healthy diet and regular exercise in order for weight loss, and to reduce the risk of any potential medical problems.   Continue to monitor.

## 2019-07-11 NOTE — Patient Instructions (Addendum)
Stop by the lab prior to leaving today. I will notify you of your results once received.   Call Dr. Scherrie November office for the nerve conduction studies. Follow up with vestibular rehab for dizziness and recurrent falls.  Call the breast center for your mammogram.   Start taking 1200 mg of calcium and 800 units of vitamin D daily for bone health.  It was a pleasure to see you today!

## 2019-07-11 NOTE — Assessment & Plan Note (Signed)
Stable on recent labs, continue vitamin D. Add in weight bearing exercise.

## 2019-07-11 NOTE — Progress Notes (Signed)
Subjective:    Patient ID: Katherine [name], female    DOB: 1951/01/07, 68 y.o.   MRN: [id number]  HPI  Katherine [name] is a 68 year old female who presents today for [place] Part 2. She spoke with our health advisor last week.  She endorses today that she can't "walk straight" and is falling "all the time". History of chronic back pain. She's been falling for the last six months with increase in falls over the last week. She is not tripping over anything or feels that she's losing her balance, but she can "just feel that I'm going to fall". She endorses that she can't walk a straight line or walking without veering to the right and left. Her husband is with her today who has witnessed falls and her imbalance.   Her chronic back pain is constant and is much worse than usual but she doesn't believe her back is causing her falls. She does have neck pain that is chronic for the last six months.She endorses intermittent numbness/tingling to her legs with increased back pain. Wellbutrin XL 150 mg was added about 3-4 months ago, she takes this at bedtime.  She also endorses difficulty with memory, difficulty with handwriting, preparing food has declined, decrease in overall strength to bilateral hands. She denies upper extremity radiculopathy. She does have dizziness with position changes (sitting to standing), feels like the room is spinning, will have to "get my bearings" before she rises out of bed or from a seated position.   She denies urinary symptoms, vaginal symptoms. Her husband denies acute confusion but has noticed gradual confusion and questions the day of the week, time of day. She will take a nap and then think it's the following morning.   She was evaluated by ENT 2-3 months ago for her dizziness, was told that she didn't have vertigo but was referred to neurology for symptoms of dizziness and falls. She underwent MRI of the brain in November 2020 which was negative. She is following with Dr.  Manuella [name] and Katherine [name] and based off of notes from Katherine [name] she was referred to PT and Vestibular Rehab. It was also suggested that she undergo nerve conduction studies but the patient has not heard anything from this.   Immunizations: -Tetanus: Completed in 2016 -Influenza: Completed this season  -Shingles: Completed both Zostavax and Shingrix -Pneumonia: Completed  Mammogram: Completed in 2019 Dexa: Completed in 2019, osteopenia. Taking vitamin D.  Colonoscopy: Completed in 2018, due in 2023 Hep C Screen: Negative in 2019  BP Readings from Last 3 Encounters:  07/11/19 122/70  01/01/19 108/70  07/18/18 112/70     Review of Systems  Constitutional: Negative for unexpected weight change.  Eyes: Negative for visual disturbance.  Respiratory: Negative for shortness of breath.   Cardiovascular: Negative for chest pain.  Musculoskeletal: Positive for arthralgias and back pain.  Skin: Negative for color change.  Neurological: Positive for dizziness. Negative for headaches.  Psychiatric/Behavioral:       Depression under better control, following with psychiatry.        Past Medical History:  Diagnosis Date  . Anemia 2018  . Anxiety   . Arthritis   . Borderline diabetes   . Chest pain    a. 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 Chicken pox    age 61  . Chronic back pain    Compressed discs. [place]  .  Depression   . Dyspnea on exertion    a. 03/2009 Echo: EF 65%, no rwma, triv TR.  Marland Kitchen GERD (gastroesophageal reflux disease)   . High cholesterol   . Hypertension   . Hypokalemia   . Migraine headache   . Obesity   . Pre-diabetes    okay now      Social History   Socioeconomic History  . Marital status: Married    Spouse name: Not on file  . Number of children: Not on file  . Years of education: Not on file  . Highest education level: Not on file    Occupational History  . Occupation: Product manager: [employer]  Tobacco Use  . Smoking status: Never Smoker  . Smokeless tobacco: Never Used  Substance and Sexual Activity  . Alcohol use: No  . Drug use: No  . Sexual activity: Yes    Partners: Male    Birth control/protection: Post-menopausal  Other Topics Concern  . Not on file  Social History Narrative   Married.   One son is 3, lives in [place].   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   Enjoys substitute teaching, gardening, cooking.   Social Determinants of Health   Financial Resource Strain: Low Risk   . Difficulty of Paying Living Expenses: Not hard at all  Food Insecurity: No Food Insecurity  . Worried About Charity fundraiser in the Last Year: Never true  . Ran Out of Food in the Last Year: Never true  Transportation Needs: No Transportation Needs  . Lack of Transportation (Medical): No  . Lack of Transportation (Non-Medical): No  Physical Activity: Inactive  . Days of Exercise per Week: 0 days  . Minutes of Exercise per Session: 0 min  Stress: Stress Concern Present  . Feeling of Stress : To some extent  Social Connections:   . Frequency of Communication with Friends and Family: Not on file  . Frequency of Social Gatherings with Friends and Family: Not on file  . Attends Religious Services: Not on file  . Active Member of Clubs or Organizations: Not on file  . Attends Archivist Meetings: Not on file  . Marital Status: Not on file  Intimate Partner Violence: Not At Risk  . Fear of Current or Ex-Partner: No  . Emotionally Abused: No  . Physically Abused: No  . Sexually Abused: No    Past Surgical History:  Procedure Laterality Date  . BLADDER SUSPENSION  2005  . CARDIAC CATHETERIZATION    . CESAREAN SECTION    . ENDOMETRIAL ABLATION  2007  . FOOT SURGERY     bilateral bunions  . LUMBAR LAMINECTOMY/DECOMPRESSION MICRODISCECTOMY Bilateral 01/12/2016   Procedure: Bilateral L4-5  Laminotomy/Foraminotomy;  Surgeon: Katherine Pies, MD;  Location: [facility];  Service: Neurosurgery;  Laterality: Bilateral;  Bilateral L4-5 Laminotomy/Foraminotomy  . TONSILLECTOMY  1971    Family History  Problem Relation Age of Onset  . Heart disease Mother   . Hypertension Mother   . Heart failure Mother        CHF  . Diabetes Mother   . Cancer Mother        CERVICAL CANCER  . Heart disease Father   . Diabetes Father   . Liver disease Father   . Hypertension Father   . Diabetes Sister   . Breast cancer Sister 49  . Heart disease Brother   . Heart failure Brother        CHF  .  Diabetes Brother   . Cancer Brother 55       THROAT AND NECK  . Cancer Maternal Grandfather   . Colon cancer Neg Hx   . Stomach cancer Neg Hx   . Rectal cancer Neg Hx     Allergies  Allergen Reactions  . Codeine Anxiety  . Darvon Anxiety  . Hydrocodone Anxiety  . Meperidine Anxiety  . Oxycodone Anxiety  . Tramadol Anxiety  . Victoza [Liraglutide] Nausea Only and Other (See Comments)    And weakness    Current Outpatient Medications on File Prior to Visit  Medication Sig Dispense Refill  . buPROPion (WELLBUTRIN XL) 150 MG 24 hr tablet Take 150 mg by mouth every morning.    . Butalbital-APAP-Caffeine 50-300-40 MG CAPS Take 1 capsule by mouth every 6 (six) hours as needed.    . carvedilol (COREG) 6.25 MG tablet Take 1 tablet (6.25 mg total) by mouth 2 (two) times daily. 180 tablet 3  . cetirizine (ZYRTEC) 10 MG tablet Take 10 mg by mouth daily as needed for allergies.    . diazepam (VALIUM) 10 MG tablet Take 10 mg by mouth at bedtime.  2  . DULoxetine (CYMBALTA) 30 MG capsule Take 5 capsules (150 mg total) by mouth daily. 150 capsule 0  . pantoprazole (PROTONIX) 40 MG tablet TAKE 1 TABLET BY MOUTH EVERY DAY 90 tablet 1  . simvastatin (ZOCOR) 20 MG tablet TAKE 1 TABLET BY MOUTH EVERY DAY 30 tablet 5  . spironolactone (ALDACTONE) 25 MG tablet TAKE 1 TABLET BY MOUTH TWICE A DAY 180 tablet 0   . topiramate (TOPAMAX) 100 MG tablet Take 1 tablet (100 mg total) by mouth daily. (Patient taking differently: Take 100 mg by mouth 2 (two) times daily. ) 90 tablet 3   No current facility-administered medications on file prior to visit.    BP 122/70   Pulse 84   Temp (!) 95.7 F (35.4 C) (Temporal)   Ht 5\' 2"  (1.575 m)   Wt 197 lb 8 oz (89.6 kg)   SpO2 97%   BMI 36.12 kg/m    Objective:   Physical Exam  Constitutional: She is oriented to person, place, and time. She appears well-nourished.  Cardiovascular: Normal rate and regular rhythm.  Respiratory: Effort normal and breath sounds normal.  Musculoskeletal:     Comments: Ambulates in the office decently. Able to get down from exam table without difficulty.  Neurological: She is alert and oriented to person, place, and time.  Answers all questions appropriately   Skin: Skin is warm and dry.  Psychiatric: She has a normal mood and affect.           Assessment & Plan:

## 2019-07-11 NOTE — Assessment & Plan Note (Signed)
Recent CBC with slight change but appears stable.  Continue to monitor.

## 2019-07-11 NOTE — Assessment & Plan Note (Signed)
Doing well on pantoprazole, no breakthrough symptoms. Endoscopy completed in 2018, essentially normal according to report.

## 2019-07-11 NOTE — Assessment & Plan Note (Signed)
Stable in the office today continue to monitor.

## 2019-07-11 NOTE — Assessment & Plan Note (Signed)
Persistent for months, MRI of brain in November 2020 without cause. Following with neurology. Will be undergoing PT and vestibular rehab soon.   Neuro exam today without abnormality.

## 2019-07-11 NOTE — Assessment & Plan Note (Signed)
Doing better with addition of Wellbutrin XL 150 mg per psychiatry. Continue current regimen. Question whether this regimen is contributing to dizziness/falls/confusion.

## 2019-07-11 NOTE — Assessment & Plan Note (Addendum)
Chronic for 6 months, increased frequency. Suspect multifactorial given chronic back pain, numerous psychiatric medications, dizziness, etc.  Agree to PT and vestibular rehab, nerve conduction studies pending.   UA today with 1+ leuks, otherwise unremarkable. CBC with WBC count of 17.5, repeat CBC with diff pending. Culture sent. No symptoms, she appears well. Await results.

## 2019-07-11 NOTE — Assessment & Plan Note (Signed)
Chronic, increased recently.  Unclear if this really contributing to falls. Following with orthopedics and pain management, also now following with neurology who is planning on PT and never conduction studies.

## 2019-07-11 NOTE — Telephone Encounter (Signed)
Patient stated she spoke with Dr Manuella Ghazi in regards to having a nerve conduction study done. His office stated that she would need an order from our office in order for them to schedule this for her.

## 2019-07-11 NOTE — Assessment & Plan Note (Signed)
Taking vitamin D only, discussed to add in Calcium 1200 mg daily and start with weight bearing exercise. Repeat bone density scan in 2021.

## 2019-07-12 LAB — URINE CULTURE
MICRO NUMBER:: 1226783
SPECIMEN QUALITY:: ADEQUATE

## 2019-07-12 NOTE — Telephone Encounter (Signed)
Please notify patient that she see's Dr. Henrene Pastor through [facility] and can call their office with her symptoms. She shouldn't need another referral but if so then have her call us back.  Noted regarding Dr. Trena Platt office for NCS.

## 2019-07-12 NOTE — Telephone Encounter (Signed)
Spoken and notified patient of Kate Clark's comments. Patient verbalized understanding.  

## 2019-07-12 NOTE — Telephone Encounter (Signed)
Called Dr Trena Platt office and was inform that they are still in the process of doing the study. They will call patient to get done.

## 2019-07-23 ENCOUNTER — Ambulatory Visit: Payer: Medicare Other

## 2019-07-24 DIAGNOSIS — R1310 Dysphagia, unspecified: Secondary | ICD-10-CM

## 2019-07-31 ENCOUNTER — Ambulatory Visit (INDEPENDENT_AMBULATORY_CARE_PROVIDER_SITE_OTHER): Payer: Medicare Other | Admitting: *Deleted

## 2019-07-31 DIAGNOSIS — E538 Deficiency of other specified B group vitamins: Secondary | ICD-10-CM | POA: Diagnosis not present

## 2019-07-31 MED ORDER — CYANOCOBALAMIN 1000 MCG/ML IJ SOLN
1000.0000 ug | Freq: Once | INTRAMUSCULAR | Status: AC
  Administered 2019-07-31: 1000 ug via INTRAMUSCULAR

## 2019-07-31 NOTE — Progress Notes (Signed)
Per orders of Kate Clark, NP, injection of Vit B12 given by Leanore Biggers M. Patient tolerated injection well.  

## 2019-08-01 ENCOUNTER — Ambulatory Visit: Payer: Medicare Other | Admitting: Psychology

## 2019-08-14 NOTE — Progress Notes (Signed)
Sulma Ruffino T. Rajan Burgard, MD Primary Care and Sports Medicine [facility] at [facility] [place], [address] Phone: [phone]  FAX: Sebastian - 69 y.o. female  MRN [id number]  Date of Birth: 1951/06/10  Visit Date: 08/15/2019  PCP: [name], NP  Referred by: [name], NP  Chief Complaint  Patient presents with  . Shoulder Pain    Right    This visit occurred during the SARS-CoV-2 public health emergency.  Safety protocols were in place, including screening questions prior to the visit, additional usage of staff PPE, and extensive cleaning of exam room while observing appropriate contact time as indicated for disinfecting solutions.   Subjective:   Katherine [name] is a 69 y.o. very pleasant female patient with Body mass index is 35.57 kg/m. who presents with the following:  She is a pleasant lady, and in the past I did see her for some right-sided adhesive capsulitis.  She presents today with some ongoing R shoulder pain.  She had been feeling well until 3 to 4 weeks ago and she had some pain in her right shoulder with restriction of motion in a deep pain in the shoulder itself.  She did not have any kind of traumatic injury.  She has had 2 different spine surgeries in the last year.  She is also had some issues with syncope 5 or 6 times recently.  She is seeing Dr. Manuella Ghazi and she also previously saw Dr. Tami Ribas.  She says it feels fairly similar to her frozen shoulder that she had years ago.  This completely resolved and I saw her about 3 years ago with this.  2 weeks ago, has some pain at night. Hurts all the time. When turns wrong.     Past Medical History, Surgical History, Social History, Family History, Problem List, Medications, and Allergies have been reviewed and updated if relevant.   GEN: No fevers, chills. Nontoxic. Primarily MSK c/o today. MSK: Detailed in the HPI GI: tolerating  PO intake without difficulty Neuro: No numbness, parasthesias, or tingling associated. Otherwise the pertinent positives of the ROS are noted above.   Objective:   BP 110/80   Pulse (!) 109   Temp 97.7 F (36.5 C) (Temporal)   Ht 5\' 2"  (1.575 m)   Wt 194 lb 8 oz (88.2 kg)   SpO2 97%   BMI 35.57 kg/m    GEN: WDWN, NAD, Non-toxic, Alert & Oriented x 3 HEENT: Atraumatic, Normocephalic.  Ears and Nose: No external deformity. EXTR: No clubbing/cyanosis/edema NEURO: Normal gait.  PSYCH: Normally interactive. Conversant. Not depressed or anxious appearing.  Calm demeanor.   Shoulder: R and L Inspection: No muscle wasting or winging Ecchymosis/edema: neg  AC joint, scapula, clavicle: NT Cervical spine: NT, full ROM Spurling's: neg ABNORMAL SIDE TESTED: R UNLESS OTHERWISE NOTED, THE CONTRALATERAL SIDE HAS FULL RANGE OF MOTION. Abduction: 5/5, LIMITED TO 155 DEGREES Flexion: 5/5, LIMITED TO 155 DEGNO ROM  IR, lift-off: 5/5. TESTED AT 90 DEGREES OF ABDUCTION, LIMITED TO 0 DEGREES ER at neutral:  5/5, TESTED AT 90 DEGREES OF ABDUCTION, LIMITED TO 20 DEGREES AC crossover and compression: PAIN Drop Test: neg Empty Can: neg Supraspinatus insertion: NT Bicipital groove: NT ALL OTHER SPECIAL TESTING EQUIVOCAL GIVEN LOSS OF MOTION C5-T1 intact Sensation intact Grip 5/5    Radiology: No results found.  Assessment and Plan:     ICD-10-CM   1. Adhesive capsulitis of right shoulder  M75.01 methylPREDNISolone acetate (DEPO-MEDROL) injection 80 mg    CANCELED: DG Shoulder Right   Classic frozen shoulder, range of motion decreased and deep dull ache.  Reviewed [facility] protocol.  Intra-articular injection today.  I had wanted to get a shoulder film, but the patient became syncopal and passed out in our office.  She did not strike anything significantly other than a mild to minimal abrasion on the knee.  She did not hit her head.  She said this is very similar with the same compared  to her prior episodes of passing out.  We checked her blood pressure and she was normotensive.  Her blood sugar was 115.  I asked the staff to give her a cup of juice.  Her husband is coming to pick her up.  After her fall she was placed in a wheelchair and she was conversant and conversant with me without any seeming problems.  Her heart exam was regular rate and rhythm with no appreciable murmurs.  Intraarticular Shoulder Aspiration/Injection Procedure Note IVAN BENNE Nov 16, 1950 Date of procedure: 08/15/2019  Procedure: Large Joint Aspiration / Injection of Shoulder, Intraarticular, R Indications: Pain  Procedure Details Verbal consent was obtained from the patient. Risks including infection explained and contrasted with benefits and alternatives. Patient prepped with Chloraprep and Ethyl Chloride used for anesthesia. An intraarticular shoulder injection was performed using the posterior approach; needle placed into joint capsule without difficulty. The patient tolerated the procedure well and had decreased pain post injection. No complications. Injection: 8 cc of Lidocaine 1% and 2 mL Depo-Medrol 40 mg. Needle: 21 gauge, 2 inch   Follow-up: Return in about 2 months (around 10/13/2019).  Meds ordered this encounter  Medications  . methylPREDNISolone acetate (DEPO-MEDROL) injection 80 mg   Modified Medications   No medications on file   No orders of the defined types were placed in this encounter.   Signed,  Maud Deed. Teancum Brule, MD   Outpatient Encounter Medications as of 08/15/2019  Medication Sig  . buPROPion (WELLBUTRIN XL) 150 MG 24 hr tablet Take 150 mg by mouth at bedtime.  . Butalbital-APAP-Caffeine 50-300-40 MG CAPS Take 1 capsule by mouth daily as needed.  . carvedilol (COREG) 6.25 MG tablet Take 1 tablet (6.25 mg total) by mouth 2 (two) times daily.  . cetirizine (ZYRTEC) 10 MG tablet Take 10 mg by mouth daily as needed for allergies.  . diazepam (VALIUM) 10 MG  tablet Take 10 mg by mouth at bedtime.  . DULoxetine (CYMBALTA) 30 MG capsule Take 5 capsules (150 mg total) by mouth daily.  . DULoxetine (CYMBALTA) 60 MG capsule Take 120 mg by mouth every morning.  . pantoprazole (PROTONIX) 40 MG tablet TAKE 1 TABLET BY MOUTH EVERY DAY  . spironolactone (ALDACTONE) 25 MG tablet TAKE 1 TABLET BY MOUTH TWICE A DAY  . topiramate (TOPAMAX) 100 MG tablet Take 1 tablet (100 mg total) by mouth daily.  . [DISCONTINUED] buPROPion (WELLBUTRIN XL) 150 MG 24 hr tablet Take 150 mg by mouth every morning.  . [DISCONTINUED] Butalbital-APAP-Caffeine 50-300-40 MG CAPS Take 1 capsule by mouth every 6 (six) hours as needed.  . [DISCONTINUED] simvastatin (ZOCOR) 20 MG tablet TAKE 1 TABLET BY MOUTH EVERY DAY  . [EXPIRED] methylPREDNISolone acetate (DEPO-MEDROL) injection 80 mg    No facility-administered encounter medications on file as of 08/15/2019.

## 2019-08-15 ENCOUNTER — Telehealth: Payer: Self-pay

## 2019-08-15 ENCOUNTER — Encounter: Payer: Self-pay | Admitting: Family Medicine

## 2019-08-15 ENCOUNTER — Ambulatory Visit (INDEPENDENT_AMBULATORY_CARE_PROVIDER_SITE_OTHER): Payer: Medicare Other | Admitting: Family Medicine

## 2019-08-15 ENCOUNTER — Other Ambulatory Visit: Payer: Self-pay

## 2019-08-15 VITALS — BP 110/80 | HR 109 | Temp 97.7°F | Ht 62.0 in | Wt 194.5 lb

## 2019-08-15 DIAGNOSIS — M7501 Adhesive capsulitis of right shoulder: Secondary | ICD-10-CM

## 2019-08-15 MED ORDER — METHYLPREDNISOLONE ACETATE 40 MG/ML IJ SUSP
80.0000 mg | Freq: Once | INTRAMUSCULAR | Status: AC
  Administered 2019-08-15: 80 mg via INTRA_ARTICULAR

## 2019-08-15 NOTE — Telephone Encounter (Signed)
Patient was here to see Dr. Lorelei Pont for acute shoulder pain. While walking down hall (being escorted by medical assistant) she lost balance and "legs just gave out without warning". Patient fell forwards towards the floor and landed on her knees then went to her stomach. She then reports to staff that she has been having acute falling episodes without warning and is following a neurologist for ongoing testing regarding these issues. She apparently has fallen multiple times in recent weeks; however, medical assistant and physician seeing patient today were not aware of safety risk ahead of time.   CMA Hedy Camara witnessed fall and CMA Ashtyn Green and Magdalen Spatz RN (myself) were present immediately following incident to assist. After determining safe to do so, Ashtyn and Butch Penny assisted patient to a wheelchair and I began assessment.   ASSESSMENT:  Patient incurred a minor carpet burn to her right knee without signs of swelling or bruising and normal ROM and was able to weight bear without pain. She denies hitting head and physical assessment of face and head area revealed no evidence of trauma. Also, no sign of any other injury to her person. She denies losing consciousness and witness Butch Penny ([place]) confirms this. She was alert and oriented X 3, pupils were equal and WNL responsive to light.VS obtained: 150/62 BP, HR: 90 and regular in rhythm. FSBS was 116. Patient skin was hot to touch and diaphoretic. Cold compress applied to neck and juice given while resting in sitting position for 25-30 minutes while waiting for husband to arrive and escort her home.   While waiting, we did discuss safety measures especially with acute fall history. Patient instructed to use a walking assistive device and have someone with her whenever she is going out anywhere for her safety. Dr. Lorelei Pont also present and checked on patient before she left. Patient continued to deny pain and vital signs/general health remained stable  therefore patient was discharged with husband after his arrival. Husband made aware of safety precautions and states that they have a follow up appointment with Dr. Brigitte Pulse Neurologist in 2 weeks for nerve conduction studies due to her falls. I pushed patient out to car in wheelchair and assisted husband with getting patient in the vehicle. She was able to weight bear, stand and pivot safely without pain or evidence of other injury. Patient and husband thanked all involved with care today.   I did follow up with team to investigate and review safety procedures. Patient did have 1:1 standby support and showed no evidence of being unsafe to ambulate prior to her fall. Patient and staff are aware now that patient should use assistive devices with transport, is a fall risk and should have someone accompany her to appointments moving forward  Will copy this message string to her PCP: Katherine Bossier, NP and Dr. Lorelei Pont who was seeing her today in an acute, sports medicine capacity for shoulder pain.

## 2019-08-16 NOTE — Telephone Encounter (Signed)
I evaluated patient in the hallway as well, she was alert and oriented and did not experience a syncopal episode. Her husband was able to drive her home. She under the care of neurology who is completing a work up for her symptoms.

## 2019-08-22 ENCOUNTER — Other Ambulatory Visit: Payer: Self-pay | Admitting: Cardiovascular Disease

## 2019-08-22 DIAGNOSIS — R296 Repeated falls: Secondary | ICD-10-CM

## 2019-08-22 DIAGNOSIS — M4316 Spondylolisthesis, lumbar region: Secondary | ICD-10-CM

## 2019-08-22 DIAGNOSIS — F3175 Bipolar disorder, in partial remission, most recent episode depressed: Secondary | ICD-10-CM | POA: Diagnosis not present

## 2019-08-23 MED ORDER — GABAPENTIN 100 MG PO CAPS: ORAL_CAPSULE | ORAL | 0 refills | Status: DC

## 2019-08-28 ENCOUNTER — Other Ambulatory Visit: Payer: Self-pay | Admitting: Cardiovascular Disease

## 2019-08-28 ENCOUNTER — Telehealth: Payer: Self-pay | Admitting: Cardiovascular Disease

## 2019-08-28 MED ORDER — SIMVASTATIN 20 MG PO TABS: 20.0000 mg | ORAL_TABLET | Freq: Every day | ORAL | 3 refills | Status: DC

## 2019-08-28 NOTE — Telephone Encounter (Signed)
Spoke with patient and she said that she was not able to get refill on her cholesterol medication. She did not think that this was discontinued and I do not see where it was stopped. Sent in refill for her and advised to let me know if she should need anything else. She was appreciative for the call with no further questions.

## 2019-08-28 NOTE — Telephone Encounter (Signed)
Pt c/o medication issue:  1. Name of Medication: cholesterol med   2. How are you currently taking this medication (dosage and times per day)?   3. Are you having a reaction (difficulty breathing--STAT)? No   4. What is your medication issue? rx was not renewed and not on current list.  Patient wants to know if [facility] cancelled this med or still wants her to take.

## 2019-08-29 ENCOUNTER — Encounter: Payer: Self-pay | Admitting: Internal Medicine

## 2019-08-29 ENCOUNTER — Other Ambulatory Visit: Payer: Self-pay

## 2019-08-29 ENCOUNTER — Ambulatory Visit (INDEPENDENT_AMBULATORY_CARE_PROVIDER_SITE_OTHER): Payer: Medicare Other | Admitting: Internal Medicine

## 2019-08-29 VITALS — BP 132/84 | HR 108 | Temp 98.5°F | Ht 61.0 in | Wt 195.4 lb

## 2019-08-29 DIAGNOSIS — K219 Gastro-esophageal reflux disease without esophagitis: Secondary | ICD-10-CM | POA: Diagnosis not present

## 2019-08-29 DIAGNOSIS — K5901 Slow transit constipation: Secondary | ICD-10-CM

## 2019-08-29 DIAGNOSIS — Z86010 Personal history of colonic polyps: Secondary | ICD-10-CM | POA: Diagnosis not present

## 2019-08-29 DIAGNOSIS — Z01818 Encounter for other preprocedural examination: Secondary | ICD-10-CM | POA: Diagnosis not present

## 2019-08-29 DIAGNOSIS — Z23 Encounter for immunization: Secondary | ICD-10-CM | POA: Diagnosis not present

## 2019-08-29 DIAGNOSIS — R1310 Dysphagia, unspecified: Secondary | ICD-10-CM | POA: Diagnosis not present

## 2019-08-29 NOTE — Patient Instructions (Signed)
Begin taking Miralax daily.  You have been scheduled for an endoscopy. Please follow written instructions given to you at your visit today. If you use inhalers (even only as needed), please bring them with you on the day of your procedure.

## 2019-08-29 NOTE — Progress Notes (Signed)
HISTORY OF PRESENT ILLNESS:  Katherine [name] is a 69 y.o. female, former special needs teacher from [place], who presents today regarding dysphagia and constipation.  I last saw the patient October 2018 when she underwent colonoscopy and upper endoscopy to evaluate iron deficiency anemia.  Complete colonoscopy revealed diverticulosis with rectosigmoid stenosis and a 9 mm tubular adenoma which was removed.  Follow-up in 5 years recommended.  Upper endoscopy revealed a distal esophageal ring but was otherwise normal.  Biopsies of the small bowel were negative for celiac disease.  Patient tells me that she has had several years of intermittent solid dysphagia.  She tells me that she has to take small bites of food or she will have trouble.  She points to the cervical inlet.  No issues with liquids.  She does have a history of classic GERD for which she takes Protonix to control symptoms.  Occasional nocturnal symptoms with dietary indiscretion.  No weight loss.  Finally, she has had chronic constipation secondary to her antidepressants.  She takes stool softeners which did not help.  Approximately once per week she needs a rescue laxative.  She is hoping for some more regular bowel movements as this current strategy is a bit uncomfortable.  Review of outside blood work from December 2020 shows mild anemia with hemoglobin 11.3.  MCV 81.1.  Last hemoglobin A1c that same month 6.3.  She reports receiving her first Covid vaccination today  REVIEW OF SYSTEMS:  All non-GI ROS negative unless otherwise stated in the HPI except for anxiety, back pain, depression, hearing problems, shortness of breath  Past Medical History:  Diagnosis Date  . Anemia 2018  . Anxiety   . Arthritis   . Borderline diabetes   . Chest pain    a. 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 Chicken pox    age 65  . Chronic back pain    Compressed discs.  [place]  . Depression   . Dyspnea on exertion    a. 03/2009 Echo: EF 65%, no rwma, triv TR.  Marland Kitchen GERD (gastroesophageal reflux disease)   . High cholesterol   . Hypertension   . Hypokalemia   . Migraine headache   . Obesity   . Pre-diabetes    okay now     Past Surgical History:  Procedure Laterality Date  . BLADDER SUSPENSION  2005  . CARDIAC CATHETERIZATION    . CESAREAN SECTION    . ENDOMETRIAL ABLATION  2007  . FOOT SURGERY     bilateral bunions  . LUMBAR LAMINECTOMY/DECOMPRESSION MICRODISCECTOMY Bilateral 01/12/2016   Procedure: Bilateral L4-5 Laminotomy/Foraminotomy;  Surgeon: Newman Pies, MD;  Location: [facility];  Service: Neurosurgery;  Laterality: Bilateral;  Bilateral L4-5 Laminotomy/Foraminotomy  . [place]  reports that she has never smoked. She has never used smokeless tobacco. She reports that she does not drink alcohol or use drugs.  family history includes Breast cancer (age of onset: 28) in her sister; Cancer in her maternal grandfather and mother; Cancer (age of onset: 81) in her brother; Diabetes in her brother, father, mother, and sister; Heart disease in her brother, father, and mother; Heart failure in her brother and mother; Hypertension in her father and mother; Liver disease in her father.  Allergies  Allergen Reactions  . Codeine Anxiety  . Darvon Anxiety  . Hydrocodone Anxiety  .  Meperidine Anxiety  . Oxycodone Anxiety  . Tramadol Anxiety  . Victoza [Liraglutide] Nausea Only and Other (See Comments)    And weakness       PHYSICAL EXAMINATION: Vital signs: BP 132/84 (BP Location: Left Arm, Patient Position: Sitting, Cuff Size: Normal)   Pulse (!) 108   Temp 98.5 F (36.9 C)   Ht 5\' 1"  (1.549 m) Comment: height measured without shoes  Wt 195 lb 6 oz (88.6 kg)   BMI 36.92 kg/m   Constitutional: Pleasant, generally well-appearing, no acute  distress Psychiatric: alert and oriented x3, cooperative Eyes: extraocular movements intact, anicteric, conjunctiva pink Mouth: oral pharynx moist, no lesions Neck: supple no lymphadenopathy Cardiovascular: heart regular rate and rhythm, no murmur Lungs: clear to auscultation bilaterally Abdomen: soft, obese, nontender, nondistended, no obvious ascites, no peritoneal signs, normal bowel sounds, no organomegaly Rectal: Omitted Extremities: no lower extremity edema bilaterally Skin: no lesions on visible extremities Neuro: No focal deficits.  Cranial nerves intact  ASSESSMENT:  1.  Intermittent solid food dysphagia.  Rule out ring or stricture.  Rule out Zenker's 2.  Known Schatzki's ring 3.  History of adenomatous colon polyp 4.  Chronic functional constipation 5.  Chronic GERD.  Requires PPI for control 6.  Obesity 7.  Diverticulosis with sigmoid stenosis 8.  History of iron deficiency anemia  PLAN:  1.  Reflux precautions with attention to weight loss and avoidance of late night meals 2.  Continue pantoprazole 40 mg daily. 3.  Schedule upper endoscopy with probable esophageal dilation.The nature of the procedure, as well as the risks, benefits, and alternatives were carefully and thoroughly reviewed with the patient. Ample time for discussion and questions allowed. The patient understood, was satisfied, and agreed to proceed. 4.  Take MiraLAX daily for constipation.  Advised how to titrate to achieve desired effect 5.  Surveillance colonoscopy around October 2023.  Patient advised  A total time of 40 minutes was spent preparing to see the patient, reviewing outside records and test, obtaining comprehensive history, performing comprehensive physical examination, counseling the patient regarding her above listed issues under the assessment, making recommendations regarding medications and ordering her procedure.  Also, documenting clinical information in the health record

## 2019-08-30 ENCOUNTER — Ambulatory Visit: Payer: Medicare Other

## 2019-09-03 ENCOUNTER — Ambulatory Visit: Payer: Medicare Other | Admitting: Psychology

## 2019-09-04 ENCOUNTER — Telehealth: Payer: Self-pay

## 2019-09-04 DIAGNOSIS — R200 Anesthesia of skin: Secondary | ICD-10-CM | POA: Diagnosis not present

## 2019-09-04 DIAGNOSIS — G20 Parkinson's disease: Secondary | ICD-10-CM | POA: Diagnosis not present

## 2019-09-04 DIAGNOSIS — R202 Paresthesia of skin: Secondary | ICD-10-CM | POA: Diagnosis not present

## 2019-09-04 NOTE — Telephone Encounter (Signed)
Pt has NV on 2/18 for B12 injection. Need to screen pt for covid symptoms. LVM

## 2019-09-06 ENCOUNTER — Ambulatory Visit: Payer: Medicare Other

## 2019-09-06 ENCOUNTER — Encounter: Payer: Medicare Other | Admitting: Internal Medicine

## 2019-09-16 ENCOUNTER — Other Ambulatory Visit: Payer: Self-pay | Admitting: Cardiovascular Disease

## 2019-09-18 ENCOUNTER — Ambulatory Visit (INDEPENDENT_AMBULATORY_CARE_PROVIDER_SITE_OTHER): Payer: Medicare Other | Admitting: *Deleted

## 2019-09-18 ENCOUNTER — Other Ambulatory Visit: Payer: Self-pay

## 2019-09-18 DIAGNOSIS — E538 Deficiency of other specified B group vitamins: Secondary | ICD-10-CM | POA: Diagnosis not present

## 2019-09-18 MED ORDER — CYANOCOBALAMIN 1000 MCG/ML IJ SOLN
1000.0000 ug | Freq: Once | INTRAMUSCULAR | Status: AC
  Administered 2019-09-18: 1000 ug via INTRAMUSCULAR

## 2019-09-18 NOTE — Progress Notes (Signed)
Per orders of Alma Friendly, NP, injection of Vit B12 given by Virl Cagey. Patient tolerated injection well.

## 2019-09-20 DIAGNOSIS — H8190 Unspecified disorder of vestibular function, unspecified ear: Secondary | ICD-10-CM | POA: Diagnosis not present

## 2019-09-20 DIAGNOSIS — R410 Disorientation, unspecified: Secondary | ICD-10-CM | POA: Diagnosis not present

## 2019-09-20 DIAGNOSIS — Z8669 Personal history of other diseases of the nervous system and sense organs: Secondary | ICD-10-CM | POA: Diagnosis not present

## 2019-09-20 DIAGNOSIS — R2689 Other abnormalities of gait and mobility: Secondary | ICD-10-CM | POA: Diagnosis not present

## 2019-09-20 DIAGNOSIS — G20 Parkinson's disease: Secondary | ICD-10-CM | POA: Diagnosis not present

## 2019-09-20 DIAGNOSIS — R4189 Other symptoms and signs involving cognitive functions and awareness: Secondary | ICD-10-CM | POA: Diagnosis not present

## 2019-09-20 DIAGNOSIS — G608 Other hereditary and idiopathic neuropathies: Secondary | ICD-10-CM | POA: Diagnosis not present

## 2019-09-26 ENCOUNTER — Encounter: Payer: Medicare Other | Admitting: Internal Medicine

## 2019-09-26 DIAGNOSIS — Z23 Encounter for immunization: Secondary | ICD-10-CM | POA: Diagnosis not present

## 2019-10-05 ENCOUNTER — Ambulatory Visit: Payer: Medicare Other

## 2019-10-10 ENCOUNTER — Telehealth: Payer: Self-pay | Admitting: Internal Medicine

## 2019-10-11 ENCOUNTER — Ambulatory Visit: Payer: Medicare Other | Admitting: Family Medicine

## 2019-10-11 ENCOUNTER — Telehealth: Payer: Self-pay

## 2019-10-11 NOTE — Telephone Encounter (Signed)
Spoke with patient and clarified that she had no solids after midnight before her egd and could take her medication up to 7:00am the morning of.  Patient agreed

## 2019-10-11 NOTE — Telephone Encounter (Signed)
Lm on vm 

## 2019-10-12 ENCOUNTER — Telehealth: Payer: Self-pay | Admitting: Internal Medicine

## 2019-10-12 ENCOUNTER — Other Ambulatory Visit: Payer: Self-pay | Admitting: Internal Medicine

## 2019-10-12 ENCOUNTER — Other Ambulatory Visit: Payer: Self-pay

## 2019-10-12 ENCOUNTER — Ambulatory Visit (INDEPENDENT_AMBULATORY_CARE_PROVIDER_SITE_OTHER): Payer: Medicare Other

## 2019-10-12 ENCOUNTER — Encounter: Payer: Self-pay | Admitting: Primary Care

## 2019-10-12 ENCOUNTER — Telehealth: Payer: Self-pay | Admitting: Primary Care

## 2019-10-12 ENCOUNTER — Ambulatory Visit (INDEPENDENT_AMBULATORY_CARE_PROVIDER_SITE_OTHER): Payer: Medicare Other | Admitting: Primary Care

## 2019-10-12 VITALS — BP 124/84 | HR 102 | Temp 96.2°F | Ht 61.0 in | Wt 193.5 lb

## 2019-10-12 DIAGNOSIS — G20 Parkinson's disease: Secondary | ICD-10-CM

## 2019-10-12 DIAGNOSIS — G43709 Chronic migraine without aura, not intractable, without status migrainosus: Secondary | ICD-10-CM

## 2019-10-12 DIAGNOSIS — M4316 Spondylolisthesis, lumbar region: Secondary | ICD-10-CM | POA: Diagnosis not present

## 2019-10-12 DIAGNOSIS — R296 Repeated falls: Secondary | ICD-10-CM

## 2019-10-12 DIAGNOSIS — Z1159 Encounter for screening for other viral diseases: Secondary | ICD-10-CM | POA: Diagnosis not present

## 2019-10-12 LAB — SARS CORONAVIRUS 2 (TAT 6-24 HRS): SARS Coronavirus 2: NEGATIVE

## 2019-10-12 MED ORDER — BUTALBITAL-APAP-CAFFEINE 50-325-40 MG PO TABS: ORAL_TABLET | ORAL | 0 refills | Status: DC

## 2019-10-12 MED ORDER — GABAPENTIN 300 MG PO CAPS: 300.0000 mg | ORAL_CAPSULE | Freq: Three times a day (TID) | ORAL | 0 refills | Status: DC

## 2019-10-12 NOTE — Assessment & Plan Note (Signed)
New Diagnosis.  Following with neurology, doing better with balance since on Sinemet IR, just started a new medication and will update.   She is working on some Charity fundraiser, she will start PT in two weeks. Using her cane.

## 2019-10-12 NOTE — Progress Notes (Signed)
Subjective:    Patient ID: Katherine [name], female    DOB: July 18, 1951, 69 y.o.   MRN: [id number]  HPI  This visit occurred during the SARS-CoV-2 public health emergency.  Safety protocols were in place, including screening questions prior to the visit, additional usage of staff PPE, and extensive cleaning of exam room while observing appropriate contact time as indicated for disinfecting solutions.   Katherine [name] is a 69 year old female with a history of Parkinson's Disease (new diagnosis), chronic back pain, anemia, obesity, restless leg syndrome, anxiety and depression who presents today with a chief complaint of tick bite. She would also like to discuss her back pain.  1) Tick Bite: She found a small, dark tick to her left abdomen two evenings ago. She applied a cleanser and neosporin. She denies rash, joint aches, increased headaches, fevers, nausea. She had been out in the yard several days ago.   2) Chronic Back Pain: History of lumbar stenosis, spondylolisthesis of lumbar spine. Previously following with Dr. Arnoldo Morale, no longer following. Currently managed on gabapentin 100 mg HS, no improvement. She will mostly take Aleve but is concerned about recurrent use.   She is struggling with lower back pain, especially since her recurrent falls and new diagnosis of Parkinson's Disease. She will be starting PT soon and is worried that this may aggravate her pain.   BP Readings from Last 3 Encounters:  10/12/19 124/84  08/29/19 132/84  08/15/19 110/80     Review of Systems  Constitutional: Negative for fever.  Gastrointestinal: Negative for nausea.  Musculoskeletal: Positive for arthralgias and back pain.  Skin: Negative for rash.  Neurological: Negative for headaches.       Past Medical History:  Diagnosis Date  . Anemia 2018  . Anxiety   . Arthritis   . Borderline diabetes   . Chest pain    a. 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 Chicken pox    age 89  . Chronic back pain    Compressed discs. [place]  . Depression   . Dyspnea on exertion    a. 03/2009 Echo: EF 65%, no rwma, triv TR.  Marland Kitchen GERD (gastroesophageal reflux disease)   . High cholesterol   . Hypertension   . Hypokalemia   . Migraine headache   . Obesity   . Pre-diabetes    okay now      Social History   Socioeconomic History  . Marital status: Married    Spouse name: Not on file  . Number of children: Not on file  . Years of education: Not on file  . Highest education level: Not on file  Occupational History  . Occupation: Product manager: [employer]  Tobacco Use  . Smoking status: Never Smoker  . Smokeless tobacco: Never Used  Substance and Sexual Activity  . Alcohol use: No  . Drug use: No  . Sexual activity: Yes    Partners: Male    Birth control/protection: Post-menopausal  Other Topics Concern  . Not on file  Social History Narrative   Married.   One son is 81, lives in [place].   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   Enjoys substitute teaching, gardening, cooking.   Social Determinants of Health   Financial Resource Strain: Low Risk   . Difficulty of Paying Living Expenses: Not hard at all  Food Insecurity: No Food Insecurity  .  Worried About Charity fundraiser in the Last Year: Never true  . Ran Out of Food in the Last Year: Never true  Transportation Needs: No Transportation Needs  . Lack of Transportation (Medical): No  . Lack of Transportation (Non-Medical): No  Physical Activity: Inactive  . Days of Exercise per Week: 0 days  . Minutes of Exercise per Session: 0 min  Stress: Stress Concern Present  . Feeling of Stress : To some extent  Social Connections:   . Frequency of Communication with Friends and Family:   . Frequency of Social Gatherings with Friends and Family:   . Attends Religious Services:   . Active Member of Clubs or  Organizations:   . Attends Archivist Meetings:   Marland Kitchen Marital Status:   Intimate Partner Violence: Not At Risk  . Fear of Current or Ex-Partner: No  . Emotionally Abused: No  . Physically Abused: No  . Sexually Abused: No    Past Surgical History:  Procedure Laterality Date  . BLADDER SUSPENSION  2005  . CARDIAC CATHETERIZATION    . CESAREAN SECTION    . ENDOMETRIAL ABLATION  2007  . FOOT SURGERY     bilateral bunions  . LUMBAR LAMINECTOMY/DECOMPRESSION MICRODISCECTOMY Bilateral 01/12/2016   Procedure: Bilateral L4-5 Laminotomy/Foraminotomy;  Surgeon: Newman Pies, MD;  Location: [facility];  Service: Neurosurgery;  Laterality: Bilateral;  Bilateral L4-5 Laminotomy/Foraminotomy  . TONSILLECTOMY  1971    Family History  Problem Relation Age of Onset  . Heart disease Mother   . Hypertension Mother   . Heart failure Mother        CHF  . Diabetes Mother   . Cancer Mother        CERVICAL CANCER  . Heart disease Father   . Diabetes Father   . Liver disease Father   . Hypertension Father   . Diabetes Sister   . Breast cancer Sister 30  . Heart disease Brother   . Heart failure Brother        CHF  . Diabetes Brother   . Cancer Brother 52       THROAT AND NECK  . Cancer Maternal Grandfather   . Colon cancer Neg Hx   . Stomach cancer Neg Hx   . Rectal cancer Neg Hx     Allergies  Allergen Reactions  . Codeine Anxiety  . Darvon Anxiety  . Hydrocodone Anxiety  . Meperidine Anxiety  . Oxycodone Anxiety  . Tramadol Anxiety  . Victoza [Liraglutide] Nausea Only and Other (See Comments)    And weakness    Current Outpatient Medications on File Prior to Visit  Medication Sig Dispense Refill  . buPROPion (WELLBUTRIN XL) 150 MG 24 hr tablet Take 150 mg by mouth at bedtime.    . carbidopa-levodopa (SINEMET IR) 25-100 MG tablet Take 1 tablet by mouth 3 (three) times daily.    . carvedilol (COREG) 6.25 MG tablet Take 1 tablet (6.25 mg total) by mouth 2 (two)  times daily. 180 tablet 3  . cetirizine (ZYRTEC) 10 MG tablet Take 10 mg by mouth daily as needed for allergies.    . diazepam (VALIUM) 10 MG tablet Take 10 mg by mouth at bedtime.  2  . DULoxetine (CYMBALTA) 30 MG capsule Take 30 mg by mouth daily.    . DULoxetine (CYMBALTA) 60 MG capsule Take 120 mg by mouth every morning.    . pantoprazole (PROTONIX) 40 MG tablet TAKE 1 TABLET BY MOUTH EVERY DAY  90 tablet 1  . rivastigmine (EXELON) 1.5 MG capsule Take 1.5 mg by mouth 2 (two) times daily.    . simvastatin (ZOCOR) 20 MG tablet Take 1 tablet (20 mg total) by mouth at bedtime. 90 tablet 3  . spironolactone (ALDACTONE) 25 MG tablet TAKE 1 TABLET BY MOUTH TWICE A DAY 60 tablet 5  . topiramate (TOPAMAX) 50 MG tablet Take 25 mg by mouth daily.      No current facility-administered medications on file prior to visit.    BP 124/84   Pulse (!) 102   Temp (!) 96.2 F (35.7 C) (Temporal)   Ht 5\' 1"  (1.549 m)   Wt 193 lb 8 oz (87.8 kg)   SpO2 98%   BMI 36.56 kg/m    Objective:   Physical Exam  Constitutional: She appears well-nourished.  Cardiovascular: Normal rate and regular rhythm.  Respiratory: Effort normal and breath sounds normal.  Musculoskeletal:     Cervical back: Neck supple.  Skin: Skin is warm and dry. No rash noted.  Small scabbed over bite to left lower abdomen. No drainage. Non tender. No surrounding erythema.  Psychiatric: She has a normal mood and affect.           Assessment & Plan:

## 2019-10-12 NOTE — Telephone Encounter (Signed)
Yes, put her in the 3:20 slot for today. Thanks.

## 2019-10-12 NOTE — Assessment & Plan Note (Signed)
Intermittent, would like refill of Fioricet to use PRN. She was provided this during an Urgent Care visit. Discussed to use sparingly, she verbalized understanding.  She also notes that this has helped with her back pain.

## 2019-10-12 NOTE — Patient Instructions (Signed)
We've increased the dose of your gabapentin to 300 mg. Take 1 capsule by mouth up to three times daily for back pain.  Use the butalbital-acetaminophen-caffeine medication sparingly as needed.    Monitor the site of the tick bite as discussed.  It was a pleasure to see you today!

## 2019-10-12 NOTE — Telephone Encounter (Signed)
Spoke with pt and she has contacted her PCP, she just wanted to make [place] aware about the tick.

## 2019-10-12 NOTE — Assessment & Plan Note (Signed)
No longer following with orthopedics/pain management. No improvement on very low dose of gabapentin which does not cause drowsiness.  Increase gabapentin to 300 mg, discussed to take TID PRN, drowsiness precautions provided.  She will update.   She does note that Fioricet helps with back pain, discussed proper use of this mediation and to use sparingly.

## 2019-10-12 NOTE — Telephone Encounter (Signed)
Spoken to patient and schedule as instructed.

## 2019-10-12 NOTE — Assessment & Plan Note (Deleted)
Following with neurology, doing better with balance since on Sinemet IR, just started a new medication and will update.   She is working on some Charity fundraiser, she will start PT in two weeks. Using her cane.

## 2019-10-12 NOTE — Telephone Encounter (Signed)
Please advise 

## 2019-10-12 NOTE — Telephone Encounter (Signed)
  Pt found a lonestar tick on her Wednesday3/24 She pulled it off and it bleed at the time. She stated it has left sore place now.  Stated it is red and has bulls eye in center.  Pt is having no sysptoms.  She wanted to know if she needed to come in to see you or what would you recommend.   [facility] [address]

## 2019-10-16 ENCOUNTER — Encounter: Payer: Self-pay | Admitting: Internal Medicine

## 2019-10-16 ENCOUNTER — Ambulatory Visit (AMBULATORY_SURGERY_CENTER): Payer: Medicare Other | Admitting: Internal Medicine

## 2019-10-16 ENCOUNTER — Other Ambulatory Visit: Payer: Self-pay

## 2019-10-16 VITALS — BP 118/65 | HR 87 | Temp 97.5°F | Resp 16 | Ht 61.0 in | Wt 195.0 lb

## 2019-10-16 DIAGNOSIS — K219 Gastro-esophageal reflux disease without esophagitis: Secondary | ICD-10-CM

## 2019-10-16 DIAGNOSIS — K222 Esophageal obstruction: Secondary | ICD-10-CM | POA: Diagnosis not present

## 2019-10-16 DIAGNOSIS — R1310 Dysphagia, unspecified: Secondary | ICD-10-CM

## 2019-10-16 DIAGNOSIS — K449 Diaphragmatic hernia without obstruction or gangrene: Secondary | ICD-10-CM | POA: Diagnosis not present

## 2019-10-16 DIAGNOSIS — K253 Acute gastric ulcer without hemorrhage or perforation: Secondary | ICD-10-CM | POA: Diagnosis not present

## 2019-10-16 MED ORDER — SODIUM CHLORIDE 0.9 % IV SOLN: 500.0000 mL | Freq: Once | INTRAVENOUS | Status: DC

## 2019-10-16 NOTE — Progress Notes (Signed)
History reviewed today  Temp JB, VS DT

## 2019-10-16 NOTE — Progress Notes (Signed)
Called to room to assist during endoscopic procedure.  Patient ID and intended procedure confirmed with present staff. Received instructions for my participation in the procedure from the performing physician.  

## 2019-10-16 NOTE — Op Note (Signed)
[facility] Patient Name: Katherine [name] Procedure Date: 10/16/2019 10:19 AM MRN: [id number] Endoscopist: Docia Chuck. Henrene Pastor , MD Age: 69 Referring MD:  Date of Birth: 1950/12/11 Gender: Female Account #: [id number] Procedure:                Upper GI endoscopy with biopsies; with balloon                            dilation esophagus?"20 mm max Indications:              Dysphagia Medicines:                Monitored Anesthesia Care Procedure:                Pre-Anesthesia Assessment:                           - Prior to the procedure, a History and Physical                            was performed, and patient medications and                            allergies were reviewed. The patient's tolerance of                            previous anesthesia was also reviewed. The risks                            and benefits of the procedure and the sedation                            options and risks were discussed with the patient.                            All questions were answered, and informed consent                            was obtained. Prior Anticoagulants: The patient has                            taken no previous anticoagulant or antiplatelet                            agents. ASA Grade Assessment: II - A patient with                            mild systemic disease. After reviewing the risks                            and benefits, the patient was deemed in                            satisfactory condition to undergo the procedure.  After obtaining informed consent, the endoscope was                            passed under direct vision. Throughout the                            procedure, the patient's blood pressure, pulse, and                            oxygen saturations were monitored continuously. The                            Endoscope was introduced through the mouth, and                            advanced to the second part of  duodenum. The upper                            GI endoscopy was accomplished without difficulty.                            The patient tolerated the procedure well. Scope In: Scope Out: Findings:                 One benign-appearing, intrinsic moderate stenosis                            was found 34 cm from the incisors. This stenosis                            measured 1.4 cm (inner diameter). After completing                            the endoscopic survey A TTS dilator was passed                            through the scope. Dilation with an 18-19-20 mm                            balloon dilator was performed to 20 mm. The                            ringlike stricture was disrupted (see image) and                            diameter improved.                           The exam of the esophagus was otherwise normal.                           The stomach revealed a moderate sized sliding  hiatal hernia and several superficial antral                            erosions. Biopsies were taken with a cold forceps                            for Helicobacter pylori testing using CLOtest.                           The examined duodenum was normal.                           The cardia and gastric fundus were otherwise normal                            on retroflexion. Complications:            No immediate complications. Estimated Blood Loss:     Estimated blood loss: none. Impression:               1. GERD complicated by peptic stricture status post                            dilation                           2. Mild antral erosions. Recommendation:           1. Patient has a contact number available for                            emergencies. The signs and symptoms of potential                            delayed complications were discussed with the                            patient. Return to normal activities tomorrow.                            Written  discharge instructions were provided to the                            patient.                           2. Post dilation diet                           3. Continue pantoprazole                           4. GI follow-up as needed. Resume care with your                            primary provider Docia Chuck. Henrene Pastor, MD 10/16/2019 10:44:52 AM This report has been signed electronically.

## 2019-10-16 NOTE — Progress Notes (Signed)
PT taken to PACU. Monitors in place. VSS. Report given to RN. 

## 2019-10-16 NOTE — Patient Instructions (Signed)
YOU HAD AN ENDOSCOPIC PROCEDURE TODAY AT [facility]:   Refer to the procedure report that was given to you for any specific questions about what was found during the examination.  If the procedure report does not answer your questions, please call your gastroenterologist to clarify.  If you requested that your care partner not be given the details of your procedure findings, then the procedure report has been included in a sealed envelope for you to review at your convenience later.  YOU SHOULD EXPECT: Some feelings of bloating in the abdomen. Passage of more gas than usual.  Walking can help get rid of the air that was put into your GI tract during the procedure and reduce the bloating. If you had a lower endoscopy (such as a colonoscopy or flexible sigmoidoscopy) you may notice spotting of blood in your stool or on the toilet paper. If you underwent a bowel prep for your procedure, you may not have a normal bowel movement for a few days.  Please Note:  You might notice some irritation and congestion in your nose or some drainage.  This is from the oxygen used during your procedure.  There is no need for concern and it should clear up in a day or so.  SYMPTOMS TO REPORT IMMEDIATELY:    Following upper endoscopy (EGD)  Vomiting of blood or coffee ground material  New chest pain or pain under the shoulder blades  Painful or persistently difficult swallowing  New shortness of breath  Fever of 100F or higher  Black, tarry-looking stools  For urgent or emergent issues, a gastroenterologist can be reached at any hour by calling [phone]. Do not use MyChart messaging for urgent concerns.    DIET:  FOLLOW DILATION HANDOUT.  ACTIVITY:  You should plan to take it easy for the rest of today and you should NOT DRIVE or use heavy machinery until tomorrow (because of the sedation medicines used during the test).    FOLLOW UP: Our staff will call the number listed on your  records 48-72 hours following your procedure to check on you and address any questions or concerns that you may have regarding the information given to you following your procedure. If we do not reach you, we will leave a message.  We will attempt to reach you two times.  During this call, we will ask if you have developed any symptoms of COVID 19. If you develop any symptoms (ie: fever, flu-like symptoms, shortness of breath, cough etc.) before then, please call [phone].  If you test positive for Covid 19 in the 2 weeks post procedure, please call and report this information to [place].    If any biopsies were taken you will be contacted by phone or by letter within the next 1-3 weeks.  Please call us at [phone] if you have not heard about the biopsies in 3 weeks.    SIGNATURES/CONFIDENTIALITY: You and/or your care partner have signed paperwork which will be entered into your electronic medical record.  These signatures attest to the fact that that the information above on your After Visit Summary has been reviewed and is understood.  Full responsibility of the confidentiality of this discharge information lies with you and/or your care-partner.   RESUME MEDICATIONS. INFORMATION GIVEN ON DILATION DIET.

## 2019-10-17 LAB — HELICOBACTER PYLORI SCREEN-BIOPSY: UREASE: NEGATIVE

## 2019-10-18 ENCOUNTER — Telehealth: Payer: Self-pay | Admitting: *Deleted

## 2019-10-18 NOTE — Telephone Encounter (Signed)
Left message

## 2019-10-18 NOTE — Telephone Encounter (Signed)
Message left

## 2019-10-24 ENCOUNTER — Ambulatory Visit: Payer: Medicare Other

## 2019-10-25 ENCOUNTER — Other Ambulatory Visit: Payer: Self-pay | Admitting: Primary Care

## 2019-10-25 DIAGNOSIS — K219 Gastro-esophageal reflux disease without esophagitis: Secondary | ICD-10-CM

## 2019-10-30 ENCOUNTER — Ambulatory Visit: Payer: Medicare Other | Admitting: Physical Therapy

## 2019-10-31 ENCOUNTER — Ambulatory Visit: Payer: Medicare Other

## 2019-11-01 ENCOUNTER — Ambulatory Visit: Payer: Medicare Other

## 2019-11-01 ENCOUNTER — Other Ambulatory Visit: Payer: Self-pay | Admitting: Primary Care

## 2019-11-01 DIAGNOSIS — G43709 Chronic migraine without aura, not intractable, without status migrainosus: Secondary | ICD-10-CM

## 2019-11-01 DIAGNOSIS — R410 Disorientation, unspecified: Secondary | ICD-10-CM | POA: Diagnosis not present

## 2019-11-05 ENCOUNTER — Ambulatory Visit: Payer: Medicare Other | Admitting: Physical Therapy

## 2019-11-05 NOTE — Telephone Encounter (Signed)
Patient contacted the office and states that she herself is requesting this refill.

## 2019-11-05 NOTE — Telephone Encounter (Signed)
Message left for patient to return my call.  

## 2019-11-05 NOTE — Telephone Encounter (Signed)
Need to ask if patient is requesting refill or is this auto refill?

## 2019-11-07 ENCOUNTER — Ambulatory Visit: Payer: Medicare Other | Admitting: Physical Therapy

## 2019-11-07 ENCOUNTER — Ambulatory Visit (INDEPENDENT_AMBULATORY_CARE_PROVIDER_SITE_OTHER): Payer: Medicare Other

## 2019-11-07 DIAGNOSIS — E538 Deficiency of other specified B group vitamins: Secondary | ICD-10-CM | POA: Diagnosis not present

## 2019-11-07 MED ORDER — CYANOCOBALAMIN 1000 MCG/ML IJ SOLN
1000.0000 ug | Freq: Once | INTRAMUSCULAR | Status: AC
  Administered 2019-11-07: 1000 ug via INTRAMUSCULAR

## 2019-11-07 NOTE — Progress Notes (Signed)
Per orders of Kate Clark, NP, injection of B12 given by Yordy Matton. Patient tolerated injection well.  

## 2019-11-07 NOTE — Telephone Encounter (Signed)
Message left for patient to return my call.  

## 2019-11-07 NOTE — Telephone Encounter (Signed)
Please notify patient that it is too soon to fill this medication.  I really don't want her taking this anyway, but she shouldn't be out.   Did she increase her gabapentin to three times daily? Did that help?

## 2019-11-07 NOTE — Telephone Encounter (Signed)
Last prescribed on 10/12/2019 . Last appointment on 10/12/2019. No future appointment

## 2019-11-12 ENCOUNTER — Ambulatory Visit: Payer: Medicare Other | Admitting: Physical Therapy

## 2019-11-12 DIAGNOSIS — M79674 Pain in right toe(s): Secondary | ICD-10-CM | POA: Diagnosis not present

## 2019-11-12 DIAGNOSIS — L600 Ingrowing nail: Secondary | ICD-10-CM | POA: Diagnosis not present

## 2019-11-12 NOTE — Telephone Encounter (Signed)
Spoken and notified patient of Tawni Millers comments. Patient stated that gabapentin did not help. Patient that Fioricet really helped out compare to taking increase dosage of Asprin or naproxen. Is there another medication patient can take that is similar to [place]?

## 2019-11-13 NOTE — Telephone Encounter (Signed)
Tylenol is in the Fioricet medication, has she tried taking 1000 mg of extra strength Tylenol 1-3 times daily? She could try this and it may help.  If not then we can try a once daily pain medication called Meloxicam to help with back pain. This can't be taken with Aleve/naproxen, Ibuprofen.

## 2019-11-14 ENCOUNTER — Ambulatory Visit: Payer: Medicare Other | Admitting: Physical Therapy

## 2019-11-14 DIAGNOSIS — R410 Disorientation, unspecified: Secondary | ICD-10-CM | POA: Diagnosis not present

## 2019-11-14 NOTE — Telephone Encounter (Signed)
Message left for patient to return my call.  

## 2019-11-19 ENCOUNTER — Ambulatory Visit: Payer: Medicare Other | Admitting: Physical Therapy

## 2019-11-20 DIAGNOSIS — F3131 Bipolar disorder, current episode depressed, mild: Secondary | ICD-10-CM | POA: Diagnosis not present

## 2019-11-21 ENCOUNTER — Ambulatory Visit: Payer: Medicare Other | Admitting: Physical Therapy

## 2019-11-26 ENCOUNTER — Ambulatory Visit: Payer: Medicare Other | Admitting: Physical Therapy

## 2019-11-28 ENCOUNTER — Ambulatory Visit: Payer: Medicare Other | Admitting: Physical Therapy

## 2019-11-29 ENCOUNTER — Ambulatory Visit: Payer: No Typology Code available for payment source

## 2019-12-03 ENCOUNTER — Ambulatory Visit: Payer: Medicare Other | Admitting: Physical Therapy

## 2019-12-03 ENCOUNTER — Other Ambulatory Visit: Payer: Self-pay

## 2019-12-03 ENCOUNTER — Ambulatory Visit (INDEPENDENT_AMBULATORY_CARE_PROVIDER_SITE_OTHER)
Admission: RE | Admit: 2019-12-03 | Discharge: 2019-12-03 | Disposition: A | Discharge status: Home / Self Care | Payer: Medicare Other | Source: Ambulatory Visit | Attending: Family Medicine | Admitting: Family Medicine

## 2019-12-03 ENCOUNTER — Ambulatory Visit (INDEPENDENT_AMBULATORY_CARE_PROVIDER_SITE_OTHER): Payer: Medicare Other | Admitting: Family Medicine

## 2019-12-03 ENCOUNTER — Encounter: Payer: Self-pay | Admitting: Family Medicine

## 2019-12-03 VITALS — BP 120/80 | HR 105 | Temp 97.3°F | Ht 63.0 in | Wt 191.0 lb

## 2019-12-03 DIAGNOSIS — M7501 Adhesive capsulitis of right shoulder: Secondary | ICD-10-CM

## 2019-12-03 DIAGNOSIS — E538 Deficiency of other specified B group vitamins: Secondary | ICD-10-CM

## 2019-12-03 DIAGNOSIS — G8929 Other chronic pain: Secondary | ICD-10-CM

## 2019-12-03 DIAGNOSIS — M1711 Unilateral primary osteoarthritis, right knee: Secondary | ICD-10-CM

## 2019-12-03 DIAGNOSIS — M25511 Pain in right shoulder: Secondary | ICD-10-CM

## 2019-12-03 MED ORDER — METHYLPREDNISOLONE ACETATE 40 MG/ML IJ SUSP
80.0000 mg | Freq: Once | INTRAMUSCULAR | Status: AC
  Administered 2019-12-03: 80 mg via INTRA_ARTICULAR

## 2019-12-03 MED ORDER — CYANOCOBALAMIN 1000 MCG/ML IJ SOLN
1000.0000 ug | Freq: Once | INTRAMUSCULAR | Status: AC
  Administered 2019-12-03: 1000 ug via INTRAMUSCULAR

## 2019-12-03 NOTE — Progress Notes (Signed)
Katherine Girten T. Levell Tavano, MD, [place]  Primary Care and Sports Medicine [facility] [place], [address]  Phone: [phone]  FAX: [place] - 69 y.o. female  MRN [id number]  Date of Birth: 17-Sep-1950  Date: 12/03/2019  PCP: Katherine Koch, NP  Referral: Katherine Koch, NP  Chief Complaint  Patient presents with  . Shoulder Pain    Right    This visit occurred during the SARS-CoV-2 public health emergency.  Safety protocols were in place, including screening questions prior to the visit, additional usage of staff PPE, and extensive cleaning of exam room while observing appropriate contact time as indicated for disinfecting solutions.   Subjective:   Katherine [name] is a 69 y.o. very pleasant female patient who presents with the following: shoulder pain  The patient noted above presents with shoulder pain that has been ongoing off and on since 2018 with waxing and waning symptoms. there is no history of trauma or accident. The patient denies neck pain or radicular symptoms. No shoulder blade pain Denies dislocation, subluxation, separation of the shoulder. The patient does complain of pain with flexion, abduction, and terminal motion.  Significant restriction of motion. she describes a deep ache around the shoulder, and sometimes it will wake the patient up at night.  Medications Tried: NSAIDs, Tylenol Ice or Heat: minimal help  Prior shoulder Injury: No Prior surgery: No Prior fracture: No  She has had a relatively recent diagnosis of Parkinson's disease.  She has had some falls in the past but none in the upper extremities.  Review of Systems is noted in the HPI, as appropriate  Objective:   Blood pressure 120/80, pulse (!) 105, temperature (!) 97.3 F (36.3 C), temperature source Temporal, height 5\' 3"  (1.6 m), weight 191 lb (86.6 kg), SpO2 97 %.   GEN: No acute  distress; alert,appropriate. PULM: Breathing comfortably in no respiratory distress PSYCH: Normally interactive.   Shoulder: R and L Inspection: No muscle wasting or winging Ecchymosis/edema: neg  AC joint, scapula, clavicle: NT Cervical spine: NT, full ROM Spurling's: neg ABNORMAL SIDE TESTED: Right UNLESS OTHERWISE NOTED, THE CONTRALATERAL SIDE HAS FULL RANGE OF MOTION. Abduction: 5/5, LIMITED TO 110 degrees DEGREES Flexion: 5/5, LIMITED TO 110 DEGNO ROM  IR, lift-off: 5/5. TESTED AT 90 DEGREES OF ABDUCTION, LIMITED TO 5 DEGREES ER at neutral:  5/5, TESTED AT 90 DEGREES OF ABDUCTION, LIMITED TO 45 DEGREES AC crossover and compression: PAIN Drop Test: neg Empty Can: neg Supraspinatus insertion: NT Bicipital groove: NT ALL OTHER SPECIAL TESTING EQUIVOCAL GIVEN LOSS OF MOTION C5-T1 intact Sensation intact Grip 5/5   Assessment and Plan:     ICD-10-CM   1. Chronic right shoulder pain  M25.511 DG Shoulder Right   G89.29 methylPREDNISolone acetate (DEPO-MEDROL) injection 80 mg  2. Primary osteoarthritis of right knee  M17.11 DG Shoulder Right  3. Vitamin B 12 deficiency  E53.8 cyanocobalamin ((VITAMIN B-12)) injection 1,000 mcg    Level of Medical Decision-Making in this case is MODERATE.  Patient was given a systematic ROM protocol from [facility] to be done daily. Emphasized importance of adherence, help of PT, daily HEP.  The average length of total symptoms is 12-18 months going through 3 different phases in the freezing and thawing process.  In this case, the patient has had well more than 2 years of symptoms.  She still has some notable loss of motion and pain.  I  discussed with her that at this point consideration of surgical opinion for possible manipulation versus arthroscopy would be reasonable and appropriate.  At this point she has no interest at home and undergoing form of procedure like this.  Tylenol or NSAID of choice prn for pain relief Intraarticular shoulder  injections discussed with patient, which have good evidence for accelerating the thawing phase.  I think that it would be a good idea to repeat this and start some progressive range of motion again  Postinjection of lidocaine and steroids, the patient did have improved motion, but is still he is abnormal in all directions.  Intraarticular Shoulder Aspiration/Injection Procedure Note NARELY DEGRAW Nov 21, 1950 Date of procedure: 12/03/2019  Procedure: Large Joint Aspiration / Injection of Shoulder, Intraarticular, R Indications: Pain  Procedure Details Verbal consent was obtained from the patient. Risks including infection explained and contrasted with benefits and alternatives. Patient prepped with Chloraprep and Ethyl Chloride used for anesthesia. An intraarticular shoulder injection was performed using the posterior approach; needle placed into joint capsule without difficulty. The patient tolerated the procedure well and had decreased pain post injection. No complications. Injection: 8 cc of Lidocaine 1% and 2 mL Depo-Medrol 40 mg. Needle: 21 gauge, 2 inch   Follow-up: 6 weeks  Meds ordered this encounter  Medications  . methylPREDNISolone acetate (DEPO-MEDROL) injection 80 mg  . cyanocobalamin ((VITAMIN B-12)) injection 1,000 mcg   There are no discontinued medications. Orders Placed This Encounter  Procedures  . DG Shoulder Right    Signed,  Katherine Hamman T. Sueanne Maniaci, MD   Outpatient Encounter Medications as of 12/03/2019  Medication Sig  . buPROPion (WELLBUTRIN XL) 150 MG 24 hr tablet Take 150 mg by mouth at bedtime.  . carbidopa-levodopa (SINEMET IR) 25-100 MG tablet Take 1 tablet by mouth 3 (three) times daily.  . carvedilol (COREG) 6.25 MG tablet Take 1 tablet (6.25 mg total) by mouth 2 (two) times daily.  . cetirizine (ZYRTEC) 10 MG tablet Take 10 mg by mouth daily as needed for allergies.  . diazepam (VALIUM) 10 MG tablet Take 10 mg by mouth at bedtime.  . DULoxetine  (CYMBALTA) 30 MG capsule Take 30 mg by mouth daily.  . DULoxetine (CYMBALTA) 60 MG capsule Take 120 mg by mouth every morning.  . gabapentin (NEURONTIN) 300 MG capsule Take 1 capsule (300 mg total) by mouth 3 (three) times daily. For back pain.  Marland Kitchen ondansetron (ZOFRAN) 8 MG tablet Take 8 mg by mouth every 8 (eight) hours as needed.  . pantoprazole (PROTONIX) 40 MG tablet TAKE 1 TABLET BY MOUTH EVERY DAY  . rivastigmine (EXELON) 1.5 MG capsule Take 1.5 mg by mouth 2 (two) times daily.  Marland Kitchen spironolactone (ALDACTONE) 25 MG tablet TAKE 1 TABLET BY MOUTH TWICE A DAY  . SUMAtriptan (IMITREX) 100 MG tablet Take by mouth.  . topiramate (TOPAMAX) 50 MG tablet Take 50 mg by mouth daily.   . simvastatin (ZOCOR) 20 MG tablet Take 1 tablet (20 mg total) by mouth at bedtime.  . [EXPIRED] cyanocobalamin ((VITAMIN B-12)) injection 1,000 mcg   . [EXPIRED] methylPREDNISolone acetate (DEPO-MEDROL) injection 80 mg    No facility-administered encounter medications on file as of 12/03/2019.

## 2019-12-05 ENCOUNTER — Ambulatory Visit: Payer: Medicare Other | Admitting: Physical Therapy

## 2019-12-10 ENCOUNTER — Ambulatory Visit: Payer: Medicare Other | Admitting: Physical Therapy

## 2019-12-12 ENCOUNTER — Ambulatory Visit: Payer: Medicare Other | Admitting: Physical Therapy

## 2019-12-31 DIAGNOSIS — L600 Ingrowing nail: Secondary | ICD-10-CM | POA: Diagnosis not present

## 2019-12-31 DIAGNOSIS — M79674 Pain in right toe(s): Secondary | ICD-10-CM | POA: Diagnosis not present

## 2020-01-01 ENCOUNTER — Emergency Department: Payer: Medicare Other

## 2020-01-01 ENCOUNTER — Other Ambulatory Visit: Payer: Self-pay

## 2020-01-01 ENCOUNTER — Encounter: Payer: Self-pay | Admitting: Medical Oncology

## 2020-01-01 ENCOUNTER — Emergency Department
Admission: EM | Admit: 2020-01-01 | Discharge: 2020-01-01 | Disposition: A | Discharge status: Home / Self Care | Payer: Medicare Other | Attending: Emergency Medicine | Admitting: Emergency Medicine

## 2020-01-01 DIAGNOSIS — S022XXA Fracture of nasal bones, initial encounter for closed fracture: Secondary | ICD-10-CM | POA: Insufficient documentation

## 2020-01-01 DIAGNOSIS — I10 Essential (primary) hypertension: Secondary | ICD-10-CM | POA: Diagnosis not present

## 2020-01-01 DIAGNOSIS — S0121XA Laceration without foreign body of nose, initial encounter: Secondary | ICD-10-CM | POA: Diagnosis present

## 2020-01-01 DIAGNOSIS — Y939 Activity, unspecified: Secondary | ICD-10-CM | POA: Insufficient documentation

## 2020-01-01 DIAGNOSIS — Y92009 Unspecified place in unspecified non-institutional (private) residence as the place of occurrence of the external cause: Secondary | ICD-10-CM | POA: Diagnosis not present

## 2020-01-01 DIAGNOSIS — Z79899 Other long term (current) drug therapy: Secondary | ICD-10-CM | POA: Diagnosis not present

## 2020-01-01 DIAGNOSIS — Y999 Unspecified external cause status: Secondary | ICD-10-CM | POA: Insufficient documentation

## 2020-01-01 DIAGNOSIS — W19XXXA Unspecified fall, initial encounter: Secondary | ICD-10-CM | POA: Diagnosis not present

## 2020-01-01 MED ORDER — ACETAMINOPHEN 325 MG PO TABS
650.0000 mg | ORAL_TABLET | Freq: Once | ORAL | Status: AC
  Administered 2020-01-01: 650 mg via ORAL
  Filled 2020-01-01: qty 2

## 2020-01-01 NOTE — ED Provider Notes (Signed)
[facility] Emergency Department Provider Note   ____________________________________________   First MD Initiated Contact with Patient 01/01/20 1257     (approximate)  I have reviewed the triage vital signs and the nursing notes.   HISTORY  Chief Complaint Fall and Laceration    HPI Katherine [name] is a 69 y.o. female patient presents with facial contusion and laceration to the nasal area secondary to a fall. Patient has Parkinson and fell at home. Patient denies LOC or head injury. Patient denies taking blood thinners. Patient rates the pain as 8/10. Patient scribed pain is "achy". Patient denies vision loss or weakness.         Past Medical History:  Diagnosis Date  . Anemia 2018  . Anxiety   . Arthritis   . Borderline diabetes   . Chest pain    a. 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 Chicken pox    age 25  . Chronic back pain    Compressed discs. [place]  . Depression   . Dyspnea on exertion    a. 03/2009 Echo: EF 65%, no rwma, triv TR.  Marland Kitchen GERD (gastroesophageal reflux disease)   . High cholesterol   . Hypertension   . Hypokalemia   . Migraine headache   . Obesity   . Pre-diabetes    okay now     Patient Active Problem List   Diagnosis Date Noted  . Parkinson's disease ([place]) 07/11/2019  . Leukocytosis 07/11/2019  . Rash and nonspecific skin eruption 02/14/2019  . Dizziness 12/14/2018  . Osteopenia 07/07/2018  . Gastroesophageal reflux disease 03/13/2018  . Chronic migraine without aura without status migrainosus, not intractable 03/13/2018  . Constipation 02/15/2017  . Spondylolisthesis of lumbar region 07/15/2016  . Vitamin D deficiency 03/11/2016  . Lumbar stenosis with neurogenic claudication 01/12/2016  . Notalgia 01/08/2016  . Restless leg syndrome 03/13/2015  . Preventative health care 12/10/2014  . Other  fatigue 11/04/2014  . Anemia 11/04/2014  . Depression 10/31/2014  . Prediabetes 10/31/2014  . Obstructive sleep apnea 05/01/2009  . Hyperlipidemia 09/04/2008  . Obesity 09/04/2008  . HYPERTENSION, BENIGN 09/04/2008    Past Surgical History:  Procedure Laterality Date  . BLADDER SUSPENSION  2005  . CARDIAC CATHETERIZATION    . CESAREAN SECTION    . ENDOMETRIAL ABLATION  2007  . FOOT SURGERY     bilateral bunions  . LUMBAR LAMINECTOMY/DECOMPRESSION MICRODISCECTOMY Bilateral 01/12/2016   Procedure: Bilateral L4-5 Laminotomy/Foraminotomy;  Surgeon: Newman Pies, MD;  Location: [facility];  Service: Neurosurgery;  Laterality: Bilateral;  Bilateral L4-5 Laminotomy/Foraminotomy  . TONSILLECTOMY  1971    Prior to Admission medications   Medication Sig Start Date End Date Taking? Authorizing Provider  buPROPion (WELLBUTRIN XL) 150 MG 24 hr tablet Take 150 mg by mouth at bedtime.    [provider]  carbidopa-levodopa (SINEMET IR) 25-100 MG tablet Take 1 tablet by mouth 3 (three) times daily.    [provider]  carvedilol (COREG) 6.25 MG tablet Take 1 tablet (6.25 mg total) by mouth 2 (two) times daily. 03/06/19   Minna Merritts, MD  cetirizine (ZYRTEC) 10 MG tablet Take 10 mg by mouth daily as needed for allergies.    [provider]  diazepam (VALIUM) 10 MG tablet Take 10 mg by mouth at bedtime. 02/01/18   [provider]  DULoxetine (CYMBALTA) 30 MG capsule Take 30  mg by mouth daily.    [provider]  DULoxetine (CYMBALTA) 60 MG capsule Take 120 mg by mouth every morning. 07/10/19   [provider]  gabapentin (NEURONTIN) 300 MG capsule Take 1 capsule (300 mg total) by mouth 3 (three) times daily. For back pain. 10/12/19   [name], NP  ondansetron (ZOFRAN) 8 MG tablet Take 8 mg by mouth every 8 (eight) hours as needed. 11/14/19   [provider]  pantoprazole (PROTONIX) 40 MG tablet TAKE 1 TABLET BY MOUTH EVERY  DAY 10/25/19   [name], NP  rivastigmine (EXELON) 1.5 MG capsule Take 1.5 mg by mouth 2 (two) times daily. 09/26/19   [provider]  simvastatin (ZOCOR) 20 MG tablet Take 1 tablet (20 mg total) by mouth at bedtime. 08/28/19 11/26/19  Minna Merritts, MD  spironolactone (ALDACTONE) 25 MG tablet TAKE 1 TABLET BY MOUTH TWICE A DAY 09/17/19   Minna Merritts, MD  SUMAtriptan (IMITREX) 100 MG tablet Take by mouth. 11/14/19 11/13/20  [provider]  topiramate (TOPAMAX) 50 MG tablet Take 50 mg by mouth daily.     [provider]    Allergies Codeine, Darvon, Hydrocodone, Meperidine, Oxycodone, Tramadol, and Victoza [liraglutide]  Family History  Problem Relation Age of Onset  . Heart disease Mother   . Hypertension Mother   . Heart failure Mother        CHF  . Diabetes Mother   . Cancer Mother        CERVICAL CANCER  . Heart disease Father   . Diabetes Father   . Liver disease Father   . Hypertension Father   . Diabetes Sister   . Breast cancer Sister 73  . Heart disease Brother   . Heart failure Brother        CHF  . Diabetes Brother   . Cancer Brother 26       THROAT AND NECK  . Esophageal cancer Brother   . Cancer Maternal Grandfather   . Colon cancer Neg Hx   . Stomach cancer Neg Hx   . Rectal cancer Neg Hx     Social History Social History   Tobacco Use  . Smoking status: Never Smoker  . Smokeless tobacco: Never Used  Vaping Use  . Vaping Use: Never used  Substance Use Topics  . Alcohol use: No  . Drug use: No    Review of Systems Constitutional: No fever/chills Eyes: No visual changes. ENT: No sore throat. Cardiovascular: Denies chest pain. Respiratory: Denies shortness of breath. Gastrointestinal: No abdominal pain.  No nausea, no vomiting.  No diarrhea.  No constipation. Genitourinary: Negative for dysuria. Musculoskeletal: Negative for back pain. Skin: Negative for rash. Neurological: Negative for headaches, focal  weakness or numbness. Psychiatric:  Anxiety and depression Endocrine:  Borderline diabetes, hyperlipidemia, and hypertension. Hematological/Lymphatic:  Allergic/Immunilogical: Codeine, Darvon, hydrocodone, Demerol, oxycodone, and Victoza. ____________________________________________   PHYSICAL EXAM:  VITAL SIGNS: ED Triage Vitals  Enc Vitals Group     BP 01/01/20 1231 101/62     Pulse Rate 01/01/20 1231 84     Resp 01/01/20 1231 16     Temp 01/01/20 1231 97.9 F (36.6 C)     Temp Source 01/01/20 1231 Oral     SpO2 01/01/20 1231 97 %     Weight 01/01/20 1236 189 lb 9.5 oz (86 kg)     Height 01/01/20 1236 5\' 3"  (1.6 m)     Head Circumference --  Peak Flow --      Pain Score 01/01/20 1236 8     Pain Loc --      Pain Edu? --      Excl. in [place]? --    Constitutional: Alert and oriented. Well appearing and in no acute distress. Eyes: Conjunctivae are normal. PERRL. EOMI. Head: Atraumatic. Nose: No congestion/rhinnorhea. Mouth/Throat: Mucous membranes are moist.  Oropharynx non-erythematous. Neck: No cervical spine tenderness to palpation. Hematological/Lymphatic/Immunilogical: No cervical lymphadenopathy. Cardiovascular: Normal rate, regular rhythm. Grossly normal heart sounds.  Good peripheral circulation. Respiratory: Normal respiratory effort.  No retractions. Lungs CTAB. Gastrointestinal: Soft and nontender. No distention. No abdominal bruits. No CVA tenderness. Genitourinary: Deferred Musculoskeletal: No lower extremity tenderness nor edema.  No joint effusions. Neurologic:  Normal speech and language. No gross focal neurologic deficits are appreciated. No gait instability. Skin:  Skin is warm, dry and intact. No rash noted. Nasal laceration. Edema and ecchymosis right facial area Psychiatric: Mood and affect are normal. Speech and behavior are normal.  ____________________________________________   LABS (all labs ordered are listed, but only abnormal results are  displayed)  Labs Reviewed - No data to display ____________________________________________  EKG   ____________________________________________  RADIOLOGY  ED MD interpretation:    Official radiology report(s): CT Maxillofacial Wo Contrast  Result Date: 01/01/2020 CLINICAL DATA:  Patient status post fall with a blow to the knee face and laceration on the nose. Initial encounter. EXAM: CT MAXILLOFACIAL WITHOUT CONTRAST TECHNIQUE: Multidetector CT imaging of the maxillofacial structures was performed. Multiplanar CT image reconstructions were also generated. COMPARISON:  None. FINDINGS: Osseous: Patient has an acute right nasal bone fracture. Also seen is a fracture of the anterior, superior aspect of the bony nasal septum with approximately 20 degrees angulation to the left. No other facial bone fracture is identified. The mandibular condyles are located. Orbits: Negative. No traumatic or inflammatory finding. Sinuses: Mucosal thickening right maxillary sinus. Soft tissues: Soft tissue contusion about the right side of the nose and right eye. Otherwise negative. Limited intracranial: No acute abnormality. IMPRESSION: Acute fractures of the right nasal bone and anterior, superior aspect of the bony nasal septum. Soft tissue contusions about the nose and right eye also noted. Mucosal thickening right maxillary sinus. Electronically Signed   By: Inge Rise M.D.   On: 01/01/2020 14:26    ____________________________________________   PROCEDURES  Procedure(s) performed (including Critical Care):  Marland KitchenMarland KitchenLaceration Repair  Date/Time: 01/01/2020 2:52 PM Performed by: Sable Feil, PA-C Authorized by: Sable Feil, PA-C   Consent:    Consent obtained:  Verbal   Consent given by:  Patient   Risks discussed:  Pain, poor cosmetic result and need for additional repair Anesthesia (see MAR for exact dosages):    Anesthesia method:  None Laceration details:    Location:  Face   Face  location:  Nose   Length (cm):  0.5   Depth (mm):  2 Repair type:    Repair type:  Simple Pre-procedure details:    Preparation:  Patient was prepped and draped in usual sterile fashion Exploration:    Contaminated: no   Treatment:    Area cleansed with:  Betadine and saline   Amount of cleaning:  Standard Skin repair:    Repair method:  Tissue adhesive Approximation:    Approximation:  Close Post-procedure details:    Dressing:  Open (no dressing)   Patient tolerance of procedure:  Tolerated well, no immediate complications     ____________________________________________   INITIAL  IMPRESSION / ASSESSMENT AND PLAN / ED COURSE  As part of my medical decision making, I reviewed the following data within the [place]     Patient presents with facial contusion and nasal edema secondary to fall. Fall is secondary to her condition of Parkinson. Patient denies LOC or head injury. Discussed maxillofacial CT findings with patient consistent with displaced nasal fracture. Patient will follow up with ENT in 2 to 3 days for reevaluation of nasal fracture. See procedure note for wound closure.    Katherine [name] was evaluated in Emergency Department on 01/01/2020 for the symptoms described in the history of present illness. She was evaluated in the context of the global COVID-19 pandemic, which necessitated consideration that the patient might be at risk for infection with the SARS-CoV-2 virus that causes COVID-19. Institutional protocols and algorithms that pertain to the evaluation of patients at risk for COVID-19 are in a state of rapid change based on information released by regulatory bodies including the CDC and federal and state organizations. These policies and algorithms were followed during the patient's care in the ED.       ____________________________________________   FINAL CLINICAL IMPRESSION(S) / ED DIAGNOSES  Final diagnoses:  Closed fracture of  nasal bone, initial encounter  Fall in home, initial encounter     ED Discharge Orders    None       Note:  This document was prepared using Dragon voice recognition software and may include unintentional dictation errors.    Sable Feil, PA-C 01/01/20 1454    Arta Silence, MD 01/01/20 1517

## 2020-01-01 NOTE — ED Triage Notes (Signed)
Pt from home- has parkinsons and fell face. Has bruising to rt eye and lac to nose. Pt not on blood thinners and no LOC. Tetanus NOT utd.

## 2020-01-01 NOTE — Discharge Instructions (Addendum)
Follow discharge care instructions for nasal fracture, Dermabond, and call the ENT clinic in the morning. Tell them you are a follow-up from the emergency room. They will schedule you for an appointment. Advised extra strength Tylenol as needed for pain.

## 2020-01-01 NOTE — ED Notes (Signed)
See triage note  Present s/p fall   States she fell into her hearth  brusing and laceration to face  No LOC

## 2020-01-07 ENCOUNTER — Other Ambulatory Visit: Payer: Self-pay

## 2020-01-07 ENCOUNTER — Encounter: Payer: Self-pay | Admitting: Unknown Physician Specialty

## 2020-01-09 ENCOUNTER — Other Ambulatory Visit
Admission: RE | Admit: 2020-01-09 | Discharge: 2020-01-09 | Disposition: A | Discharge status: Home / Self Care | Payer: Medicare Other | Source: Ambulatory Visit | Attending: Unknown Physician Specialty | Admitting: Unknown Physician Specialty

## 2020-01-09 ENCOUNTER — Other Ambulatory Visit: Payer: Self-pay

## 2020-01-09 DIAGNOSIS — Z20822 Contact with and (suspected) exposure to covid-19: Secondary | ICD-10-CM | POA: Insufficient documentation

## 2020-01-09 DIAGNOSIS — Z01812 Encounter for preprocedural laboratory examination: Secondary | ICD-10-CM | POA: Diagnosis present

## 2020-01-09 LAB — SARS CORONAVIRUS 2 (TAT 6-24 HRS): SARS Coronavirus 2: NEGATIVE

## 2020-01-11 ENCOUNTER — Encounter: Payer: Self-pay | Admitting: Unknown Physician Specialty

## 2020-01-11 ENCOUNTER — Other Ambulatory Visit: Payer: Self-pay

## 2020-01-11 ENCOUNTER — Ambulatory Visit: Payer: Medicare Other | Admitting: Anesthesiology

## 2020-01-11 ENCOUNTER — Encounter
Admission: RE | Disposition: A | Discharge status: Home / Self Care | Payer: Self-pay | Source: Home / Self Care | Attending: Unknown Physician Specialty

## 2020-01-11 ENCOUNTER — Emergency Department
Admission: EM | Admit: 2020-01-11 | Discharge: 2020-01-11 | Disposition: A | Discharge status: Home / Self Care | Payer: Medicare Other | Source: Home / Self Care | Attending: Student in an Organized Health Care Education/Training Program | Admitting: Student in an Organized Health Care Education/Training Program

## 2020-01-11 ENCOUNTER — Ambulatory Visit
Admission: RE | Admit: 2020-01-11 | Discharge: 2020-01-11 | Disposition: A | Discharge status: Home / Self Care | Payer: Medicare Other | Attending: Unknown Physician Specialty | Admitting: Unknown Physician Specialty

## 2020-01-11 DIAGNOSIS — Y999 Unspecified external cause status: Secondary | ICD-10-CM | POA: Insufficient documentation

## 2020-01-11 DIAGNOSIS — Z79899 Other long term (current) drug therapy: Secondary | ICD-10-CM | POA: Insufficient documentation

## 2020-01-11 DIAGNOSIS — W19XXXA Unspecified fall, initial encounter: Secondary | ICD-10-CM | POA: Insufficient documentation

## 2020-01-11 DIAGNOSIS — Z6835 Body mass index (BMI) 35.0-35.9, adult: Secondary | ICD-10-CM | POA: Diagnosis not present

## 2020-01-11 DIAGNOSIS — F419 Anxiety disorder, unspecified: Secondary | ICD-10-CM | POA: Insufficient documentation

## 2020-01-11 DIAGNOSIS — G20 Parkinson's disease: Secondary | ICD-10-CM | POA: Diagnosis not present

## 2020-01-11 DIAGNOSIS — S022XXA Fracture of nasal bones, initial encounter for closed fracture: Secondary | ICD-10-CM | POA: Insufficient documentation

## 2020-01-11 DIAGNOSIS — Y939 Activity, unspecified: Secondary | ICD-10-CM | POA: Insufficient documentation

## 2020-01-11 DIAGNOSIS — M48062 Spinal stenosis, lumbar region with neurogenic claudication: Secondary | ICD-10-CM | POA: Diagnosis not present

## 2020-01-11 DIAGNOSIS — G43909 Migraine, unspecified, not intractable, without status migrainosus: Secondary | ICD-10-CM | POA: Insufficient documentation

## 2020-01-11 DIAGNOSIS — E119 Type 2 diabetes mellitus without complications: Secondary | ICD-10-CM | POA: Insufficient documentation

## 2020-01-11 DIAGNOSIS — G8929 Other chronic pain: Secondary | ICD-10-CM | POA: Insufficient documentation

## 2020-01-11 DIAGNOSIS — Y929 Unspecified place or not applicable: Secondary | ICD-10-CM | POA: Insufficient documentation

## 2020-01-11 DIAGNOSIS — D649 Anemia, unspecified: Secondary | ICD-10-CM | POA: Diagnosis not present

## 2020-01-11 DIAGNOSIS — E669 Obesity, unspecified: Secondary | ICD-10-CM | POA: Diagnosis not present

## 2020-01-11 DIAGNOSIS — I10 Essential (primary) hypertension: Secondary | ICD-10-CM | POA: Insufficient documentation

## 2020-01-11 DIAGNOSIS — M549 Dorsalgia, unspecified: Secondary | ICD-10-CM | POA: Insufficient documentation

## 2020-01-11 DIAGNOSIS — W540XXA Bitten by dog, initial encounter: Secondary | ICD-10-CM | POA: Insufficient documentation

## 2020-01-11 DIAGNOSIS — Z792 Long term (current) use of antibiotics: Secondary | ICD-10-CM | POA: Insufficient documentation

## 2020-01-11 DIAGNOSIS — K219 Gastro-esophageal reflux disease without esophagitis: Secondary | ICD-10-CM | POA: Diagnosis not present

## 2020-01-11 DIAGNOSIS — G4730 Sleep apnea, unspecified: Secondary | ICD-10-CM | POA: Insufficient documentation

## 2020-01-11 DIAGNOSIS — Z79891 Long term (current) use of opiate analgesic: Secondary | ICD-10-CM | POA: Insufficient documentation

## 2020-01-11 DIAGNOSIS — Z791 Long term (current) use of non-steroidal anti-inflammatories (NSAID): Secondary | ICD-10-CM | POA: Insufficient documentation

## 2020-01-11 DIAGNOSIS — S0033XA Contusion of nose, initial encounter: Secondary | ICD-10-CM | POA: Insufficient documentation

## 2020-01-11 DIAGNOSIS — M1990 Unspecified osteoarthritis, unspecified site: Secondary | ICD-10-CM | POA: Insufficient documentation

## 2020-01-11 DIAGNOSIS — E7800 Pure hypercholesterolemia, unspecified: Secondary | ICD-10-CM | POA: Insufficient documentation

## 2020-01-11 DIAGNOSIS — Z888 Allergy status to other drugs, medicaments and biological substances status: Secondary | ICD-10-CM | POA: Insufficient documentation

## 2020-01-11 DIAGNOSIS — Z885 Allergy status to narcotic agent status: Secondary | ICD-10-CM | POA: Insufficient documentation

## 2020-01-11 HISTORY — DX: Parkinson's disease: G20

## 2020-01-11 HISTORY — DX: Parkinson's disease without dyskinesia, without mention of fluctuations: G20.A1

## 2020-01-11 HISTORY — PX: CLOSED REDUCTION NASAL FRACTURE: SHX5365

## 2020-01-11 SURGERY — CLOSED REDUCTION, FRACTURE, NASAL BONE
Anesthesia: General | Site: Nose

## 2020-01-11 MED ORDER — IBUPROFEN 100 MG/5ML PO SUSP: 200.0000 mg | Freq: Four times a day (QID) | ORAL | Status: DC | PRN

## 2020-01-11 MED ORDER — LACTATED RINGERS IV SOLN: INTRAVENOUS | Status: DC

## 2020-01-11 MED ORDER — TRAMADOL HCL 50 MG PO TABS: 50.0000 mg | ORAL_TABLET | Freq: Four times a day (QID) | ORAL | 0 refills | Status: DC | PRN

## 2020-01-11 MED ORDER — ONDANSETRON HCL 4 MG/2ML IJ SOLN
INTRAMUSCULAR | Status: DC | PRN
  Administered 2020-01-11: 4 mg via INTRAVENOUS

## 2020-01-11 MED ORDER — MIDAZOLAM HCL 5 MG/5ML IJ SOLN
INTRAMUSCULAR | Status: DC | PRN
  Administered 2020-01-11: 2 mg via INTRAVENOUS

## 2020-01-11 MED ORDER — OXYMETAZOLINE HCL 0.05 % NA SOLN
6.0000 | Freq: Once | NASAL | Status: AC
  Administered 2020-01-11: 6 via NASAL

## 2020-01-11 MED ORDER — PHENYLEPHRINE HCL 0.5 % NA SOLN
NASAL | Status: DC | PRN
  Administered 2020-01-11: 30 mL via NASAL

## 2020-01-11 MED ORDER — IBUPROFEN 200 MG PO TABS: 200.0000 mg | ORAL_TABLET | Freq: Four times a day (QID) | ORAL | Status: DC | PRN

## 2020-01-11 MED ORDER — FENTANYL CITRATE (PF) 100 MCG/2ML IJ SOLN
INTRAMUSCULAR | Status: DC | PRN
  Administered 2020-01-11 (×2): 25 ug via INTRAVENOUS
  Administered 2020-01-11: 50 ug via INTRAVENOUS

## 2020-01-11 MED ORDER — LIDOCAINE HCL (CARDIAC) PF 100 MG/5ML IV SOSY
PREFILLED_SYRINGE | INTRAVENOUS | Status: DC | PRN
  Administered 2020-01-11: 50 mg via INTRATRACHEAL

## 2020-01-11 MED ORDER — PROPOFOL 10 MG/ML IV BOLUS
INTRAVENOUS | Status: DC | PRN
  Administered 2020-01-11: 200 mg via INTRAVENOUS

## 2020-01-11 MED ORDER — FENTANYL CITRATE (PF) 100 MCG/2ML IJ SOLN: 25.0000 ug | INTRAMUSCULAR | Status: DC | PRN

## 2020-01-11 MED ORDER — GLYCOPYRROLATE 0.2 MG/ML IJ SOLN
INTRAMUSCULAR | Status: DC | PRN
  Administered 2020-01-11: .2 mg via INTRAVENOUS

## 2020-01-11 SURGICAL SUPPLY — 12 items
BASIN GRAD PLASTIC 32OZ STRL (MISCELLANEOUS) ×2 IMPLANT
CANISTER SUCT 1200ML W/VALVE (MISCELLANEOUS) ×2 IMPLANT
COVER MAYO STAND STRL (DRAPES) ×2 IMPLANT
CUP MEDICINE 2OZ PLAST GRAD ST (MISCELLANEOUS) ×4 IMPLANT
GAUZE SPONGE 4X4 12PLY STRL (GAUZE/BANDAGES/DRESSINGS) ×2 IMPLANT
GLOVE BIO SURGEON STRL SZ7.5 (GLOVE) ×2 IMPLANT
SPONGE NEURO XRAY DETECT 1X3 (DISPOSABLE) ×2 IMPLANT
STRIP CLOSURE SKIN 1/2X4 (GAUZE/BANDAGES/DRESSINGS) ×2 IMPLANT
SUCTION FRAZIER HANDLE 10FR (MISCELLANEOUS) ×2
SUCTION TUBE FRAZIER 10FR DISP (MISCELLANEOUS) ×1 IMPLANT
TOWEL OR 17X26 4PK STRL BLUE (TOWEL DISPOSABLE) ×2 IMPLANT
TUBING CONNECTING 10 (TUBING) ×2 IMPLANT

## 2020-01-11 NOTE — ED Provider Notes (Signed)
[facility] Emergency Department Provider Note ____________________________________________  Time seen: 2012  I have reviewed the triage vital signs and the nursing notes.  HISTORY  Chief Complaint  face pain  HPI Katherine [name] is a 69 y.o. female presents to the ED accompanied by her husband, for evaluation of facial contusion.  Patient is in the postop phase after a closed reduction of a out fracture of the nasal bone, performed by Dr. Tami Ribas this morning.  Patient's nose is splinted, but she describes being bumped in the face by her large pit bull puppy just prior to arrival.  She reports  that the dog's head hit the broad side of her nose on the right, but she denies any nosebleed, loss of consciousness, nausea, vomiting, dizziness.  She also denies any bleeding from a superficial laceration that was sutured, under the splint.  She presents to the ED for evaluation and concern over possible disruption of her recent nasal bone fracture reduction.  Patient suffered the fracture following a mechanical fall resulting in a facial contusion.  She presents with significant bruising and swelling to the right cheek related to the initial injury.  No new injury, bleeding, bruising is reported.  Past Medical History:  Diagnosis Date  . Anemia 2018  . Anxiety   . Arthritis   . Borderline diabetes   . Chest pain    a. 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 Chicken pox    age 35  . Chronic back pain    Compressed discs. [place]  . Depression   . Dyspnea on exertion    a. 03/2009 Echo: EF 65%, no rwma, triv TR.  Marland Kitchen GERD (gastroesophageal reflux disease)   . High cholesterol   . Hypertension   . Hypokalemia   . Migraine headache   . Obesity   . Parkinson's disease ([place])   . Pre-diabetes    okay now     Patient Active Problem List   Diagnosis Date Noted  .  Parkinson's disease ([place]) 07/11/2019  . Leukocytosis 07/11/2019  . Rash and nonspecific skin eruption 02/14/2019  . Dizziness 12/14/2018  . Osteopenia 07/07/2018  . Gastroesophageal reflux disease 03/13/2018  . Chronic migraine without aura without status migrainosus, not intractable 03/13/2018  . Constipation 02/15/2017  . Spondylolisthesis of lumbar region 07/15/2016  . Vitamin D deficiency 03/11/2016  . Lumbar stenosis with neurogenic claudication 01/12/2016  . Notalgia 01/08/2016  . Restless leg syndrome 03/13/2015  . Preventative health care 12/10/2014  . Other fatigue 11/04/2014  . Anemia 11/04/2014  . Depression 10/31/2014  . Prediabetes 10/31/2014  . Obstructive sleep apnea 05/01/2009  . Hyperlipidemia 09/04/2008  . Obesity 09/04/2008  . HYPERTENSION, BENIGN 09/04/2008    Past Surgical History:  Procedure Laterality Date  . BLADDER SUSPENSION  2005  . CARDIAC CATHETERIZATION    . CESAREAN SECTION    . ENDOMETRIAL ABLATION  2007  . FOOT SURGERY     bilateral bunions  . LUMBAR LAMINECTOMY/DECOMPRESSION MICRODISCECTOMY Bilateral 01/12/2016   Procedure: Bilateral L4-5 Laminotomy/Foraminotomy;  Surgeon: Newman Pies, MD;  Location: [facility];  Service: Neurosurgery;  Laterality: Bilateral;  Bilateral L4-5 Laminotomy/Foraminotomy  . TONSILLECTOMY  1971    Prior to Admission medications   Medication Sig Start Date End Date Taking? Authorizing Provider  acetaminophen (TYLENOL) 325 MG tablet Take 650 mg by mouth every 6 (six) hours as needed.    [provider]  buPROPion (WELLBUTRIN XL) 150 MG 24 hr tablet Take 150 mg by mouth at bedtime.    [provider]  carbidopa-levodopa (SINEMET IR) 25-100 MG tablet Take 1 tablet by mouth 3 (three) times daily.    [provider]  carvedilol (COREG) 6.25 MG tablet Take 1 tablet (6.25 mg total) by mouth 2 (two) times daily. 03/06/19   Minna Merritts, MD  cetirizine (ZYRTEC) 10 MG tablet Take 10 mg by  mouth daily as needed for allergies.    [provider]  Cyanocobalamin (VITAMIN B-12 PO) Take by mouth daily.    [provider]  diazepam (VALIUM) 10 MG tablet Take 10 mg by mouth at bedtime. 02/01/18   [provider]  DULoxetine (CYMBALTA) 30 MG capsule Take 30 mg by mouth daily. (Total dose = 150 mg)    [provider]  DULoxetine (CYMBALTA) 60 MG capsule Take 120 mg by mouth every morning. (Total dose = 150 mg) 07/10/19   [provider]  gabapentin (NEURONTIN) 300 MG capsule Take 1 capsule (300 mg total) by mouth 3 (three) times daily. For back pain. 10/12/19   [name], NP  Melatonin 12 MG TABS Take by mouth at bedtime as needed.    [provider]  Multiple Vitamin (MULTIVITAMIN) tablet Take 1 tablet by mouth daily.    [provider]  ondansetron (ZOFRAN) 8 MG tablet Take 8 mg by mouth every 8 (eight) hours as needed. 11/14/19   [provider]  OVER THE COUNTER MEDICATION Olly Beauty vitamin    [provider]  pantoprazole (PROTONIX) 40 MG tablet TAKE 1 TABLET BY MOUTH EVERY DAY 10/25/19   [name], NP  rivastigmine (EXELON) 1.5 MG capsule Take 1.5 mg by mouth 2 (two) times daily. Patient not taking: Reported on 01/11/2020 09/26/19   [provider]  simvastatin (ZOCOR) 20 MG tablet Take 1 tablet (20 mg total) by mouth at bedtime. 08/28/19 01/07/20  Minna Merritts, MD  spironolactone (ALDACTONE) 25 MG tablet TAKE 1 TABLET BY MOUTH TWICE A DAY 09/17/19   Minna Merritts, MD  SUMAtriptan (IMITREX) 100 MG tablet Take by mouth. 11/14/19 11/13/20  [provider]  topiramate (TOPAMAX) 50 MG tablet Take 50 mg by mouth daily.     [provider]  traMADol (ULTRAM) 50 MG tablet Take 1 tablet (50 mg total) by mouth every 6 (six) hours as needed. 01/11/20 01/10/21  Artia Gust, MD    Allergies Dilaudid [hydromorphone], Codeine, Darvon, Hydrocodone, Meperidine, Oxycodone,  Tramadol, and Victoza [liraglutide]  Family History  Problem Relation Age of Onset  . Heart disease Mother   . Hypertension Mother   . Heart failure Mother        CHF  . Diabetes Mother   . Cancer Mother        CERVICAL CANCER  . Heart disease Father   . Diabetes Father   . Liver disease Father   . Hypertension Father   . Diabetes Sister   . Breast cancer Sister 63  . Heart disease Brother   . Heart failure Brother        CHF  . Diabetes Brother   . Cancer Brother 41       THROAT AND NECK  . Esophageal cancer Brother   . Cancer Maternal Grandfather   . Colon cancer Neg Hx   . Stomach cancer Neg Hx   . Rectal cancer Neg Hx  Social History Social History   Tobacco Use  . Smoking status: Never Smoker  . Smokeless tobacco: Never Used  Vaping Use  . Vaping Use: Never used  Substance Use Topics  . Alcohol use: No  . Drug use: No    Review of Systems  Constitutional: Negative for fever. Eyes: Negative for visual changes. ENT: Negative for sore throat.  Facial contusion as above. Cardiovascular: Negative for chest pain. Respiratory: Negative for shortness of breath. Skin: Negative for rash. Neurological: Negative for headaches, focal weakness or numbness. ____________________________________________  PHYSICAL EXAM:  VITAL SIGNS: ED Triage Vitals [01/11/20 1958]  Enc Vitals Group     BP (!) 124/51     Pulse Rate 85     Resp 16     Temp 98.2 F (36.8 C)     Temp src      SpO2 97 %     Weight 175 lb (79.4 kg)     Height 5\' 1"  (1.549 m)     Head Circumference      Peak Flow      Pain Score 5     Pain Loc      Pain Edu?      Excl. in [place]?     Constitutional: Alert and oriented. Well appearing and in no distress. Head: Normocephalic and atraumatic, except for significant hematoma and ecchymosis to the right cheek.. Eyes: Conjunctivae are normal. Normal extraocular movements Nose: No gross deformity is appreciated. Dorsal nasal bridge splint in place.   Dried blood is noted in the bilateral nares.  No active bleeding is appreciated.  No septal hematomas noted.  Nasal turbinates are pink and moist. Mouth/Throat: Mucous membranes are moist. Cardiovascular: Normal rate, regular rhythm. Normal distal pulses. Respiratory: Normal respiratory effort. No wheezes/rales/rhonchi. Musculoskeletal: Nontender with normal range of motion in all extremities.  Neurologic:  Normal gait without ataxia. Normal speech and language. No gross focal neurologic deficits are appreciated. Skin:  Skin is warm, dry and intact. No rash noted. ____________________________________________  PROCEDURES  Procedures ____________________________________________  INITIAL IMPRESSION / ASSESSMENT AND PLAN / ED COURSE  Patient presents to the ED for evaluation of a facial contusion in the post-op period of her close nasal bone fracture reduction.  ----------------------------------------- 8:31 PM on 01/11/2020 ----------------------------------------- Spoke with Dr. Richardson Landry, who is on-call for the group.  We discussed the case, he determined there is no emergent need for reimaging of the nasal bones.  Patient can apply ice to the face over the nasal splint.  She may keep her appointment with Dr. Tami Ribas on Friday as scheduled.  She is also advised that she may call the office on Monday if inclined.  No emergent intervention is necessary at this time.  Patient will follow up with Dr. Tami Ribas in 1 week as planned.  Patient and husband verbalized understanding of the discharge plan and are relieved at her normal exam at this time.  THAILA BOTTOMS was evaluated in Emergency Department on 01/11/2020 for the symptoms described in the history of present illness. She was evaluated in the context of the global COVID-19 pandemic, which necessitated consideration that the patient might be at risk for infection with the SARS-CoV-2 virus that causes COVID-19. Institutional protocols and  algorithms that pertain to the evaluation of patients at risk for COVID-19 are in a state of rapid change based on information released by regulatory bodies including the CDC and federal and state organizations. These policies and algorithms were followed during the patient's care in  the ED. ____________________________________________  FINAL CLINICAL IMPRESSION(S) / ED DIAGNOSES  Final diagnoses:  Contusion of nose, initial encounter      Melvenia Needles, PA-C 01/11/20 2132    Merlyn Lot, MD 01/11/20 2348

## 2020-01-11 NOTE — Discharge Instructions (Signed)
General Anesthesia, Adult, Care After This sheet gives you information about how to care for yourself after your procedure. Your health care provider may also give you more specific instructions. If you have problems or questions, contact your health care provider. What can I expect after the procedure? After the procedure, the following side effects are common:  Pain or discomfort at the IV site.  Nausea.  Vomiting.  Sore throat.  Trouble concentrating.  Feeling cold or chills.  Weak or tired.  Sleepiness and fatigue.  Soreness and body aches. These side effects can affect parts of the body that were not involved in surgery. Follow these instructions at home:  For at least 24 hours after the procedure:  Have a responsible adult stay with you. It is important to have someone help care for you until you are awake and alert.  Rest as needed.  Do not: ? Participate in activities in which you could fall or become injured. ? Drive. ? Use heavy machinery. ? Drink alcohol. ? Take sleeping pills or medicines that cause drowsiness. ? Make important decisions or sign legal documents. ? Take care of children on your own. Eating and drinking  Follow any instructions from your health care provider about eating or drinking restrictions.  When you feel hungry, start by eating small amounts of foods that are soft and easy to digest (bland), such as toast. Gradually return to your regular diet.  Drink enough fluid to keep your urine pale yellow.  If you vomit, rehydrate by drinking water, juice, or clear broth. General instructions  If you have sleep apnea, surgery and certain medicines can increase your risk for breathing problems. Follow instructions from your health care provider about wearing your sleep device: ? Anytime you are sleeping, including during daytime naps. ? While taking prescription pain medicines, sleeping medicines, or medicines that make you drowsy.  Return to  your normal activities as told by your health care provider. Ask your health care provider what activities are safe for you.  Take over-the-counter and prescription medicines only as told by your health care provider.  If you smoke, do not smoke without supervision.  Keep all follow-up visits as told by your health care provider. This is important. Contact a health care provider if:  You have nausea or vomiting that does not get better with medicine.  You cannot eat or drink without vomiting.  You have pain that does not get better with medicine.  You are unable to pass urine.  You develop a skin rash.  You have a fever.  You have redness around your IV site that gets worse. Get help right away if:  You have difficulty breathing.  You have chest pain.  You have blood in your urine or stool, or you vomit blood. Summary  After the procedure, it is common to have a sore throat or nausea. It is also common to feel tired.  Have a responsible adult stay with you for the first 24 hours after general anesthesia. It is important to have someone help care for you until you are awake and alert.  When you feel hungry, start by eating small amounts of foods that are soft and easy to digest (bland), such as toast. Gradually return to your regular diet.  Drink enough fluid to keep your urine pale yellow.  Return to your normal activities as told by your health care provider. Ask your health care provider what activities are safe for you. This information is not   intended to replace advice given to you by your health care provider. Make sure you discuss any questions you have with your health care provider. Document Revised: 07/08/2017 Document Reviewed: 02/18/2017 Elsevier Patient Education  2020 Elsevier Inc.  

## 2020-01-11 NOTE — Anesthesia Postprocedure Evaluation (Signed)
Anesthesia Post Note  Patient: Katherine [name]  Procedure(s) Performed: CLOSED REDUCTION NASAL FRACTURE (N/A Nose)     Patient location during evaluation: PACU Anesthesia Type: General Level of consciousness: awake and alert Pain management: pain level controlled Vital Signs Assessment: post-procedure vital signs reviewed and stable Respiratory status: spontaneous breathing Cardiovascular status: stable Anesthetic complications: no   No complications documented.  Gillian Scarce

## 2020-01-11 NOTE — Anesthesia Preprocedure Evaluation (Signed)
Anesthesia Evaluation  Patient identified by MRN, date of birth, ID band Patient awake    Reviewed: Allergy & Precautions, H&P , NPO status , Patient's Chart, lab work & pertinent test results  History of Anesthesia Complications Negative for: history of anesthetic complications  Airway Mallampati: II  TM Distance: >3 FB   Mouth opening: Limited Mouth Opening Comment: Mouth opening mildly limited by swelling. Dental no notable dental hx.    Pulmonary sleep apnea ,    Pulmonary exam normal        Cardiovascular hypertension, On Medications Normal cardiovascular exam Rhythm:regular Rate:Normal     Neuro/Psych  Headaches, Parkinson's disease    GI/Hepatic Neg liver ROS, Medicated,  Endo/Other  negative endocrine ROS  Renal/GU negative Renal ROS     Musculoskeletal   Abdominal   Peds  Hematology  (+) Blood dyscrasia, anemia ,   Anesthesia Other Findings   Reproductive/Obstetrics                             Anesthesia Physical Anesthesia Plan  ASA: II  Anesthesia Plan: General LMA   Post-op Pain Management:    Induction:   PONV Risk Score and Plan: 2 and Ondansetron  Airway Management Planned:   Additional Equipment:   Intra-op Plan:   Post-operative Plan:   Informed Consent: I have reviewed the patients History and Physical, chart, labs and discussed the procedure including the risks, benefits and alternatives for the proposed anesthesia with the patient or authorized representative who has indicated his/her understanding and acceptance.       Plan Discussed with:   Anesthesia Plan Comments:         Anesthesia Quick Evaluation

## 2020-01-11 NOTE — Transfer of Care (Signed)
Immediate Anesthesia Transfer of Care Note  Patient: Katherine [name]  Procedure(s) Performed: CLOSED REDUCTION NASAL FRACTURE (N/A Nose)  Patient Location: PACU  Anesthesia Type: General LMA  Level of Consciousness: awake, alert  and patient cooperative  Airway and Oxygen Therapy: Patient Spontanous Breathing and Patient connected to supplemental oxygen  Post-op Assessment: Post-op Vital signs reviewed, Patient's Cardiovascular Status Stable, Respiratory Function Stable, Patent Airway and No signs of Nausea or vomiting  Post-op Vital Signs: Reviewed and stable  Complications: No complications documented.

## 2020-01-11 NOTE — Anesthesia Procedure Notes (Signed)
Procedure Name: LMA Insertion Date/Time: 01/11/2020 10:36 AM Performed by: Jeannene Patella, CRNA Pre-anesthesia Checklist: Patient identified, Emergency Drugs available, Suction available, Patient being monitored and Timeout performed Patient Re-evaluated:Patient Re-evaluated prior to induction Oxygen Delivery Method: Circle system utilized Preoxygenation: Pre-oxygenation with 100% oxygen Induction Type: IV induction Ventilation: Mask ventilation without difficulty LMA: LMA inserted LMA Size: 4.0 Number of attempts: 1 Tube secured with: Tape Dental Injury: Teeth and Oropharynx as per pre-operative assessment

## 2020-01-11 NOTE — Op Note (Signed)
01/11/2020  10:49 AM    Katherine [name]  [id number]   Pre-Op Dx: Nasal Fracture  Post-op Dx: SAME  Proc: Closed reduction nasal fracture  Surg:  [name]  Anes:  GOT  EBL: Less than 5 cc  Comp: None  Findings: Obvious out fracture of the right nasal bone and fracture of the left  Procedure: [name] was identified in the holding area take the operating room placed in supine position.  After general laryngeal mask anesthesia cottonoid pledgets with phenylephrine lidocaine solution were placed within each nostril.  These left approximately 8 minutes.  Examination of the nose plus comparison of the CT scan showed obvious outfracture of the right nasal bone mild depression of the left with a superior septal fracture.  A flat smooth elevator was placed within the nostril and the nasal dorsum including the superior portion of the nasal septum were elevated out back into anatomic position.  The right nasal bone was then infractured back to anatomic position and the left nasal bone slightly outfractured anatomic position.  This gave excellent reduction of the nasal fracture.  Inspection from superior lateral and inferior showed the nasal bones to be realigned examination intranasally showed the nasal septum to be relatively straight.  The airway was patent.  Steri-Strips were placed across the nasal dorsum, an Aquaplast cast was then fashioned for her nose heated and placed over the dorsum of the nose for stabilization.  Care was taken not to let the hot water get into her eyes.  A piece of tape was placed across the splint to keep it in position.  The patient was then awakened in the operating room taken care room in stable condition.  Dispo:   Good  Plan: Discharged home follow-up 1 week  [name]  01/11/2020 10:49 AM

## 2020-01-11 NOTE — H&P (Signed)
The patient's history has been reviewed, patient examined, no change in status, stable for surgery.  Questions were answered to the patients satisfaction.  

## 2020-01-11 NOTE — Discharge Instructions (Addendum)
You should follow-up with Dr. Tami Ribas by phone on Monday. Apply ice over the splint to reduce pain and swelling. Use the Afrin as directed.

## 2020-01-11 NOTE — ED Triage Notes (Signed)
Pt to the er for a check on surgical sutures. Pt had a rhinoplasty with Dr Tami Ribas this morning and today when she got home her big dog bumped in her face. Pt is worried that he knocked something loose.

## 2020-01-11 NOTE — ED Notes (Signed)
Patient declined discharge vital signs. 

## 2020-01-14 ENCOUNTER — Encounter: Payer: Self-pay | Admitting: Unknown Physician Specialty

## 2020-01-15 DIAGNOSIS — R2689 Other abnormalities of gait and mobility: Secondary | ICD-10-CM | POA: Diagnosis not present

## 2020-01-15 DIAGNOSIS — G608 Other hereditary and idiopathic neuropathies: Secondary | ICD-10-CM | POA: Diagnosis not present

## 2020-01-15 DIAGNOSIS — G3183 Dementia with Lewy bodies: Secondary | ICD-10-CM | POA: Diagnosis not present

## 2020-01-15 DIAGNOSIS — F0281 Dementia in other diseases classified elsewhere with behavioral disturbance: Secondary | ICD-10-CM | POA: Diagnosis not present

## 2020-01-22 ENCOUNTER — Other Ambulatory Visit: Payer: Self-pay | Admitting: Neurology

## 2020-01-22 DIAGNOSIS — F0281 Dementia in other diseases classified elsewhere with behavioral disturbance: Secondary | ICD-10-CM

## 2020-01-22 DIAGNOSIS — G3183 Dementia with Lewy bodies: Secondary | ICD-10-CM

## 2020-01-22 DIAGNOSIS — F02818 Dementia in other diseases classified elsewhere, unspecified severity, with other behavioral disturbance: Secondary | ICD-10-CM

## 2020-02-01 ENCOUNTER — Telehealth: Payer: Self-pay | Admitting: Primary Care

## 2020-02-01 NOTE — Telephone Encounter (Signed)
She needs a lab appointment for B12 first. Also is she taking any oral B12?

## 2020-02-01 NOTE — Telephone Encounter (Signed)
Patient called in and is wanting to re-start her B-12 injections. Patient last had one 11/07/19. Patient stated she got sick and needed to stop coming in. Would it be okay to schedule for [place] or would you like labs taken first? Please advise.

## 2020-02-06 ENCOUNTER — Telehealth (INDEPENDENT_AMBULATORY_CARE_PROVIDER_SITE_OTHER): Payer: Medicare Other | Admitting: Primary Care

## 2020-02-06 ENCOUNTER — Telehealth: Payer: Self-pay | Admitting: Primary Care

## 2020-02-06 DIAGNOSIS — R059 Cough, unspecified: Secondary | ICD-10-CM | POA: Insufficient documentation

## 2020-02-06 DIAGNOSIS — H52223 Regular astigmatism, bilateral: Secondary | ICD-10-CM | POA: Diagnosis not present

## 2020-02-06 DIAGNOSIS — H04123 Dry eye syndrome of bilateral lacrimal glands: Secondary | ICD-10-CM | POA: Diagnosis not present

## 2020-02-06 DIAGNOSIS — H2513 Age-related nuclear cataract, bilateral: Secondary | ICD-10-CM | POA: Diagnosis not present

## 2020-02-06 DIAGNOSIS — H35033 Hypertensive retinopathy, bilateral: Secondary | ICD-10-CM | POA: Diagnosis not present

## 2020-02-06 DIAGNOSIS — H43812 Vitreous degeneration, left eye: Secondary | ICD-10-CM | POA: Diagnosis not present

## 2020-02-06 DIAGNOSIS — R05 Cough: Secondary | ICD-10-CM | POA: Diagnosis not present

## 2020-02-06 DIAGNOSIS — H524 Presbyopia: Secondary | ICD-10-CM | POA: Diagnosis not present

## 2020-02-06 DIAGNOSIS — H5213 Myopia, bilateral: Secondary | ICD-10-CM | POA: Diagnosis not present

## 2020-02-06 NOTE — Telephone Encounter (Signed)
Patient called office stating she use to take b-12 shots for a year and stopped taking them for 3 months. She was diagnosed with Parkinson's in feb. 2021 and wants to know if she should restart the b-12 shots or not. Please call her back at [phone].

## 2020-02-06 NOTE — Telephone Encounter (Signed)
There's already a phone note about this from 02/01/20. See phone note.

## 2020-02-06 NOTE — Patient Instructions (Signed)
Resume your pantoprazole 40 mg daily for cough. Take this around dinner time.  Please call me Monday next week if no improvement.  It was a pleasure to see you today! [name], NP-C

## 2020-02-06 NOTE — Assessment & Plan Note (Signed)
Acute cough without other symptoms. Overall feels well. Suspicious for silent reflux cough, especially given that it mostly occurs when laying down, non productive, occurs about one hour after laying, no other symptoms.  Resume pantoprazole since she has these on hand at home. Discussed to update in 3-4 days if no improvement. Update sooner if she develops any other symptoms.

## 2020-02-06 NOTE — Progress Notes (Signed)
Subjective:    Patient ID: Katherine [name], female    DOB: 17-Nov-1950, 69 y.o.   MRN: [id number]  HPI  Virtual Visit via Video Note  I connected with Katherine [name] on 02/06/20 at 11:20 AM EDT by a video enabled telemedicine application and verified that I am speaking with the correct person using two identifiers.  Location: Patient: Home Provider: Office Participants: Myself and patient   I discussed the limitations of evaluation and management by telemedicine and the availability of in person appointments. The patient expressed understanding and agreed to proceed.  We attempted to connect via video, but she could not get the video to work. We conducted our visit via phone which lasted 6 min and 13 sec.  History of Present Illness:  Katherine [name] is a 69 year old female with a history of hypertension, migraines, OSA, GERD, parkinson's disease, prediabetes, anemia, restless leg syndrome who presents today with a chief complaint of cough.  She also reports soreness to her chest with coughing. Her cough is non productive, worse at night when laying down, occurring about one hour after laying down. Her cough began 3-4 days ago. She denies fevers, rhinorrhea, fevers, chills, post nasal drip, sore throat, esophageal burning. Her husband does not have a cough. She's not taken anything OTC for her cough.  She's been off of her pantoprazole for months.    Observations/Objective:  Alert and oriented. No distress. Speaking in complete sentences. No cough during visit.  Assessment and Plan:  Acute cough without other symptoms. Overall feels well. Suspicious for silent reflux cough, especially given that it mostly occurs when laying down, non productive, occurs about one hour after laying, no other symptoms.  Resume pantoprazole since she has these on hand at home. Discussed to update in 3-4 days if no improvement. Update sooner if she develops any other symptoms.  Follow Up  Instructions:  Resume your pantoprazole 40 mg daily for cough. Take this around dinner time.  Please call me Monday next week if no improvement.  It was a pleasure to see you today! [name], NP-C    I discussed the assessment and treatment plan with the patient. The patient was provided an opportunity to ask questions and all were answered. The patient agreed with the plan and demonstrated an understanding of the instructions.   The patient was advised to call back or seek an in-person evaluation if the symptoms worsen or if the condition fails to improve as anticipated.    [name], NP    Review of Systems  Constitutional: Negative for chills and fever.  HENT: Negative for congestion, postnasal drip, rhinorrhea and sore throat.   Respiratory: Positive for cough. Negative for shortness of breath.   Cardiovascular: Negative for chest pain.       Past Medical History:  Diagnosis Date  . Anemia 2018  . Anxiety   . Arthritis   . Borderline diabetes   . Chest pain    a. 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 Chicken pox    age 32  . Chronic back pain    Compressed discs. [place]  . Depression   . Dyspnea on exertion    a. 03/2009 Echo: EF 65%, no rwma, triv TR.  Marland Kitchen GERD (gastroesophageal reflux disease)   . High cholesterol   . Hypertension   . Hypokalemia   . Migraine headache   .  Obesity   . Parkinson's disease ([place])   . Pre-diabetes    okay now      Social History   Socioeconomic History  . Marital status: Married    Spouse name: Not on file  . Number of children: Not on file  . Years of education: Not on file  . Highest education level: Not on file  Occupational History  . Occupation: Product manager: [employer]  Tobacco Use  . Smoking status: Never Smoker  . Smokeless tobacco: Never Used  Vaping Use  . Vaping Use: Never  used  Substance and Sexual Activity  . Alcohol use: No  . Drug use: No  . Sexual activity: Yes    Partners: Male    Birth control/protection: Post-menopausal  Other Topics Concern  . Not on file  Social History Narrative   Married.   One son is 52, lives in [place].   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   Enjoys substitute teaching, gardening, cooking.   Social Determinants of Health   Financial Resource Strain: Low Risk   . Difficulty of Paying Living Expenses: Not hard at all  Food Insecurity: No Food Insecurity  . Worried About Charity fundraiser in the Last Year: Never true  . Ran Out of Food in the Last Year: Never true  Transportation Needs: No Transportation Needs  . Lack of Transportation (Medical): No  . Lack of Transportation (Non-Medical): No  Physical Activity: Inactive  . Days of Exercise per Week: 0 days  . Minutes of Exercise per Session: 0 min  Stress: Stress Concern Present  . Feeling of Stress : To some extent  Social Connections:   . Frequency of Communication with Friends and Family:   . Frequency of Social Gatherings with Friends and Family:   . Attends Religious Services:   . Active Member of Clubs or Organizations:   . Attends Archivist Meetings:   Marland Kitchen Marital Status:   Intimate Partner Violence: Not At Risk  . Fear of Current or Ex-Partner: No  . Emotionally Abused: No  . Physically Abused: No  . Sexually Abused: No    Past Surgical History:  Procedure Laterality Date  . BLADDER SUSPENSION  2005  . CARDIAC CATHETERIZATION    . CESAREAN SECTION    . CLOSED REDUCTION NASAL FRACTURE N/A 01/11/2020   Procedure: CLOSED REDUCTION NASAL FRACTURE;  Surgeon: Joyce Gust, MD;  Location: [facility];  Service: ENT;  Laterality: N/A;  . ENDOMETRIAL ABLATION  2007  . FOOT SURGERY     bilateral bunions  . LUMBAR LAMINECTOMY/DECOMPRESSION MICRODISCECTOMY Bilateral 01/12/2016   Procedure: Bilateral L4-5 Laminotomy/Foraminotomy;  Surgeon:  Newman Pies, MD;  Location: [facility];  Service: Neurosurgery;  Laterality: Bilateral;  Bilateral L4-5 Laminotomy/Foraminotomy  . TONSILLECTOMY  1971    Family History  Problem Relation Age of Onset  . Heart disease Mother   . Hypertension Mother   . Heart failure Mother        CHF  . Diabetes Mother   . Cancer Mother        CERVICAL CANCER  . Heart disease Father   . Diabetes Father   . Liver disease Father   . Hypertension Father   . Diabetes Sister   . Breast cancer Sister 11  . Heart disease Brother   . Heart failure Brother        CHF  . Diabetes Brother   . Cancer Brother 48  THROAT AND NECK  . Esophageal cancer Brother   . Cancer Maternal Grandfather   . Colon cancer Neg Hx   . Stomach cancer Neg Hx   . Rectal cancer Neg Hx     Allergies  Allergen Reactions  . Dilaudid [Hydromorphone]     hallucinations  . Codeine Anxiety  . Darvon Anxiety  . Hydrocodone Anxiety  . Meperidine Anxiety  . Oxycodone Anxiety  . Tramadol Anxiety  . Victoza [Liraglutide] Nausea Only and Other (See Comments)    And weakness    Current Outpatient Medications on File Prior to Visit  Medication Sig Dispense Refill  . acetaminophen (TYLENOL) 325 MG tablet Take 650 mg by mouth every 6 (six) hours as needed.    Marland Kitchen buPROPion (WELLBUTRIN XL) 150 MG 24 hr tablet Take 150 mg by mouth at bedtime.    . carbidopa-levodopa (SINEMET IR) 25-100 MG tablet Take 1 tablet by mouth 3 (three) times daily.    . carvedilol (COREG) 6.25 MG tablet Take 1 tablet (6.25 mg total) by mouth 2 (two) times daily. 180 tablet 3  . cetirizine (ZYRTEC) 10 MG tablet Take 10 mg by mouth daily as needed for allergies.    . Cyanocobalamin (VITAMIN B-12 PO) Take by mouth daily.    . diazepam (VALIUM) 10 MG tablet Take 10 mg by mouth at bedtime.  2  . DULoxetine (CYMBALTA) 30 MG capsule Take 30 mg by mouth daily. (Total dose = 150 mg)    . DULoxetine (CYMBALTA) 60 MG capsule Take 120 mg by mouth every  morning. (Total dose = 150 mg)    . gabapentin (NEURONTIN) 300 MG capsule Take 1 capsule (300 mg total) by mouth 3 (three) times daily. For back pain. 90 capsule 0  . Melatonin 12 MG TABS Take by mouth at bedtime as needed.    . Multiple Vitamin (MULTIVITAMIN) tablet Take 1 tablet by mouth daily.    . ondansetron (ZOFRAN) 8 MG tablet Take 8 mg by mouth every 8 (eight) hours as needed.    Marland Kitchen OVER THE COUNTER MEDICATION Olly Beauty vitamin    . pantoprazole (PROTONIX) 40 MG tablet TAKE 1 TABLET BY MOUTH EVERY DAY 90 tablet 1  . rivastigmine (EXELON) 1.5 MG capsule Take 1.5 mg by mouth 2 (two) times daily.     Marland Kitchen spironolactone (ALDACTONE) 25 MG tablet TAKE 1 TABLET BY MOUTH TWICE A DAY 60 tablet 5  . SUMAtriptan (IMITREX) 100 MG tablet Take by mouth.    . topiramate (TOPAMAX) 50 MG tablet Take 50 mg by mouth daily.     . traMADol (ULTRAM) 50 MG tablet Take 1 tablet (50 mg total) by mouth every 6 (six) hours as needed. 30 tablet 0  . simvastatin (ZOCOR) 20 MG tablet Take 1 tablet (20 mg total) by mouth at bedtime. 90 tablet 3   No current facility-administered medications on file prior to visit.    There were no vitals taken for this visit.   Objective:   Physical Exam Constitutional:      General: She is not in acute distress. Pulmonary:     Effort: Pulmonary effort is normal.     Comments: No cough during phone call Neurological:     Mental Status: She is alert.  Psychiatric:        Mood and Affect: Mood normal.            Assessment & Plan:

## 2020-02-07 ENCOUNTER — Telehealth: Payer: Self-pay | Admitting: Primary Care

## 2020-02-07 DIAGNOSIS — Z87440 Personal history of urinary (tract) infections: Secondary | ICD-10-CM

## 2020-02-07 NOTE — Telephone Encounter (Signed)
Message left for patient to return my call.  

## 2020-02-07 NOTE — Telephone Encounter (Signed)
Pt needs refill on  sulfametoxazole-tmpds   monograth (generic name)  She stated she takes this for vaginal irritation after sex  [facility]

## 2020-02-08 ENCOUNTER — Ambulatory Visit (HOSPITAL_COMMUNITY)
Admission: EM | Admit: 2020-02-08 | Discharge: 2020-02-08 | Disposition: A | Discharge status: Home / Self Care | Payer: Medicare Other | Attending: Family Medicine | Admitting: Family Medicine

## 2020-02-08 ENCOUNTER — Other Ambulatory Visit: Payer: Self-pay

## 2020-02-08 ENCOUNTER — Encounter (HOSPITAL_COMMUNITY): Payer: Self-pay

## 2020-02-08 DIAGNOSIS — S01312A Laceration without foreign body of left ear, initial encounter: Secondary | ICD-10-CM

## 2020-02-08 DIAGNOSIS — Z23 Encounter for immunization: Secondary | ICD-10-CM | POA: Diagnosis not present

## 2020-02-08 MED ORDER — SULFAMETHOXAZOLE-TRIMETHOPRIM 800-160 MG PO TABS: ORAL_TABLET | ORAL | 0 refills | Status: DC

## 2020-02-08 MED ORDER — TETANUS-DIPHTH-ACELL PERTUSSIS 5-2.5-18.5 LF-MCG/0.5 IM SUSP
INTRAMUSCULAR | Status: AC
  Filled 2020-02-08: qty 0.5

## 2020-02-08 MED ORDER — TETANUS-DIPHTH-ACELL PERTUSSIS 5-2.5-18.5 LF-MCG/0.5 IM SUSP
0.5000 mL | Freq: Once | INTRAMUSCULAR | Status: AC
  Administered 2020-02-08: 0.5 mL via INTRAMUSCULAR

## 2020-02-08 MED ORDER — LIDOCAINE HCL (PF) 1 % IJ SOLN
INTRAMUSCULAR | Status: AC
  Filled 2020-02-08: qty 30

## 2020-02-08 NOTE — Telephone Encounter (Signed)
Refill(s) sent to pharmacy. Patient uses this after intercourse to prevent UTI. History of recurrent UTI.

## 2020-02-08 NOTE — ED Triage Notes (Signed)
Patient reports she fell last night and her left ear clipped the corner of the dresser.

## 2020-02-08 NOTE — Telephone Encounter (Signed)
[name] (spouse) returned call [facility] number [phone]

## 2020-02-08 NOTE — Discharge Instructions (Addendum)
Use ice to area Keep clean and dry Come back Monday for wound check

## 2020-02-08 NOTE — Telephone Encounter (Signed)
Please advise. Not on current medication list.   sulfamethoxazole-trimethoprim (BACTRIM DS) 800-160 MG tablet   Last prescribed by Dr Silvio Pate on 04/23/2019 #10 with 2 refils

## 2020-02-08 NOTE — ED Provider Notes (Signed)
[facility]    CSN: [id number] Arrival date & time: 02/08/20  1449      History   Chief Complaint Chief Complaint  Patient presents with  . Ear Injury    HPI Katherine [name] is a 69 y.o. female.   HPI  Patient has Parkinson's disease.  Recently she has been having more falls.  She fell in her home last night and her ear clipped the corner of the dresser.  It is open, bleeding has stopped.  Desires repair followup with neurology is soon We discussed using a walker  Past Medical History:  Diagnosis Date  . Anemia 2018  . Anxiety   . Arthritis   . Borderline diabetes   . Chest pain    a. 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 Chicken pox    age 25  . Chronic back pain    Compressed discs. [place]  . Depression   . Dyspnea on exertion    a. 03/2009 Echo: EF 65%, no rwma, triv TR.  Marland Kitchen GERD (gastroesophageal reflux disease)   . High cholesterol   . Hypertension   . Hypokalemia   . Migraine headache   . Obesity   . Parkinson's disease ([place])   . Pre-diabetes    okay now     Patient Active Problem List   Diagnosis Date Noted  . Cough 02/06/2020  . Parkinson's disease ([place]) 07/11/2019  . Leukocytosis 07/11/2019  . Rash and nonspecific skin eruption 02/14/2019  . Dizziness 12/14/2018  . Osteopenia 07/07/2018  . Gastroesophageal reflux disease 03/13/2018  . Chronic migraine without aura without status migrainosus, not intractable 03/13/2018  . Constipation 02/15/2017  . Spondylolisthesis of lumbar region 07/15/2016  . Vitamin D deficiency 03/11/2016  . Lumbar stenosis with neurogenic claudication 01/12/2016  . Notalgia 01/08/2016  . Restless leg syndrome 03/13/2015  . Preventative health care 12/10/2014  . Other fatigue 11/04/2014  . Anemia 11/04/2014  . Depression 10/31/2014  . Prediabetes 10/31/2014  . Obstructive sleep apnea 05/01/2009  .  Hyperlipidemia 09/04/2008  . Obesity 09/04/2008  . HYPERTENSION, BENIGN 09/04/2008    Past Surgical History:  Procedure Laterality Date  . BLADDER SUSPENSION  2005  . CARDIAC CATHETERIZATION    . CESAREAN SECTION    . CLOSED REDUCTION NASAL FRACTURE N/A 01/11/2020   Procedure: CLOSED REDUCTION NASAL FRACTURE;  Surgeon: Carlyne Gust, MD;  Location: [facility];  Service: ENT;  Laterality: N/A;  . ENDOMETRIAL ABLATION  2007  . FOOT SURGERY     bilateral bunions  . LUMBAR LAMINECTOMY/DECOMPRESSION MICRODISCECTOMY Bilateral 01/12/2016   Procedure: Bilateral L4-5 Laminotomy/Foraminotomy;  Surgeon: Newman Pies, MD;  Location: [facility];  Service: Neurosurgery;  Laterality: Bilateral;  Bilateral L4-5 Laminotomy/Foraminotomy  . TONSILLECTOMY  1971    OB History   No obstetric history on file.      Home Medications    Prior to Admission medications   Medication Sig Start Date End Date Taking? Authorizing Provider  acetaminophen (TYLENOL) 325 MG tablet Take 650 mg by mouth every 6 (six) hours as needed.    [provider]  buPROPion (WELLBUTRIN XL) 150 MG 24 hr tablet Take 150 mg by mouth at bedtime.    [provider]  carbidopa-levodopa (SINEMET IR) 25-100 MG tablet Take 1 tablet by mouth 3 (three) times daily.    [provider]  carvedilol (COREG) 6.25 MG tablet Take 1  tablet (6.25 mg total) by mouth 2 (two) times daily. 03/06/19   Minna Merritts, MD  cetirizine (ZYRTEC) 10 MG tablet Take 10 mg by mouth daily as needed for allergies.    [provider]  Cyanocobalamin (VITAMIN B-12 PO) Take by mouth daily.    [provider]  diazepam (VALIUM) 10 MG tablet Take 10 mg by mouth at bedtime. 02/01/18   [provider]  DULoxetine (CYMBALTA) 30 MG capsule Take 30 mg by mouth daily. (Total dose = 150 mg)    [provider]  DULoxetine (CYMBALTA) 60 MG capsule Take 120 mg by mouth every morning. (Total dose =  150 mg) 07/10/19   [provider]  gabapentin (NEURONTIN) 300 MG capsule Take 1 capsule (300 mg total) by mouth 3 (three) times daily. For back pain. 10/12/19   [name], NP  Melatonin 12 MG TABS Take by mouth at bedtime as needed.    [provider]  Multiple Vitamin (MULTIVITAMIN) tablet Take 1 tablet by mouth daily.    [provider]  ondansetron (ZOFRAN) 8 MG tablet Take 8 mg by mouth every 8 (eight) hours as needed. 11/14/19   [provider]  OVER THE COUNTER MEDICATION Olly Beauty vitamin    [provider]  pantoprazole (PROTONIX) 40 MG tablet TAKE 1 TABLET BY MOUTH EVERY DAY 10/25/19   [name], NP  rivastigmine (EXELON) 1.5 MG capsule Take 1.5 mg by mouth 2 (two) times daily.  09/26/19   [provider]  simvastatin (ZOCOR) 20 MG tablet Take 1 tablet (20 mg total) by mouth at bedtime. 08/28/19 01/07/20  Minna Merritts, MD  spironolactone (ALDACTONE) 25 MG tablet TAKE 1 TABLET BY MOUTH TWICE A DAY 09/17/19   Minna Merritts, MD  sulfamethoxazole-trimethoprim (BACTRIM DS) 800-160 MG tablet Take 1 tablet by mouth twice daily as needed after intercourse to prevent UTI. 02/08/20   [name], NP  SUMAtriptan (IMITREX) 100 MG tablet Take by mouth. 11/14/19 11/13/20  [provider]  topiramate (TOPAMAX) 50 MG tablet Take 50 mg by mouth daily.     [provider]  traMADol (ULTRAM) 50 MG tablet Take 1 tablet (50 mg total) by mouth every 6 (six) hours as needed. 01/11/20 01/10/21  Aliana Gust, MD    Family History Family History  Problem Relation Age of Onset  . Heart disease Mother   . Hypertension Mother   . Heart failure Mother        CHF  . Diabetes Mother   . Cancer Mother        CERVICAL CANCER  . Heart disease Father   . Diabetes Father   . Liver disease Father   . Hypertension Father   . Diabetes Sister   . Breast cancer Sister 51  . Heart disease Brother   . Heart failure  Brother        CHF  . Diabetes Brother   . Cancer Brother 25       THROAT AND NECK  . Esophageal cancer Brother   . Cancer Maternal Grandfather   . Colon cancer Neg Hx   . Stomach cancer Neg Hx   . Rectal cancer Neg Hx     Social History Social History   Tobacco Use  . Smoking status: Never Smoker  . Smokeless tobacco: Never Used  Vaping Use  . Vaping Use: Never used  Substance Use Topics  . Alcohol use: No  . Drug use:  No     Allergies   Dilaudid [hydromorphone], Codeine, Darvon, Hydrocodone, Meperidine, Oxycodone, Tramadol, and Victoza [liraglutide]   Review of Systems Review of Systems See HPI  Physical Exam Triage Vital Signs ED Triage Vitals  Enc Vitals Group     BP 02/08/20 1556 126/73     Pulse Rate 02/08/20 1556 63     Resp 02/08/20 1556 14     Temp 02/08/20 1556 98.6 F (37 C)     Temp src --      SpO2 02/08/20 1556 100 %     Weight --      Height --      Head Circumference --      Peak Flow --      Pain Score 02/08/20 1552 8     Pain Loc --      Pain Edu? --      Excl. in [place]? --    No data found.  Updated Vital Signs BP 126/73   Pulse 63   Temp 98.6 F (37 C)   Resp 14   SpO2 100%        Physical Exam Constitutional:      General: She is not in acute distress.    Appearance: She is well-developed.  HENT:     Head: Normocephalic and atraumatic.      Comments: There is a complicated laceration of the ear with a notch, some of skin missing. Eyes:     Conjunctiva/sclera: Conjunctivae normal.     Pupils: Pupils are equal, round, and reactive to light.  Cardiovascular:     Rate and Rhythm: Normal rate.  Pulmonary:     Effort: Pulmonary effort is normal. No respiratory distress.  Abdominal:     General: There is no distension.     Palpations: Abdomen is soft.  Musculoskeletal:        General: Normal range of motion.     Cervical back: Normal range of motion.  Skin:    General: Skin is warm and dry.  Neurological:     Mental  Status: She is alert.      UC Treatments / Results  Labs (all labs ordered are listed, but only abnormal results are displayed) Labs Reviewed - No data to display  EKG   Radiology No results found.  Procedures Laceration Repair  Date/Time: 02/08/2020 5:52 PM Performed by: Raylene Everts, MD Authorized by: Raylene Everts, MD   Consent:    Consent obtained:  Verbal   Risks discussed:  Poor cosmetic result and poor wound healing Anesthesia (see MAR for exact dosages):    Anesthesia method:  Local infiltration   Local anesthetic:  Lidocaine 1% w/o epi Laceration details:    Location:  Ear   Ear location:  L ear   Length (cm):  5   Depth (mm):  4 Repair type:    Repair type:  Intermediate Pre-procedure details:    Preparation:  Patient was prepped and draped in usual sterile fashion Exploration:    Hemostasis achieved with:  Direct pressure   Contaminated: no   Treatment:    Area cleansed with:  Shur-Clens   Amount of cleaning:  Extensive   Irrigation volume:  10   Irrigation method:  Syringe   Visualized foreign bodies/material removed: yes   Skin repair:    Repair method:  Sutures   Suture size:  5-0   Suture material:  Nylon   Suture technique:  Simple interrupted   Number  of sutures:  7 Post-procedure details:    Dressing:  Antibiotic ointment and non-adherent dressing   Patient tolerance of procedure:  Tolerated well, no immediate complications   (Dead skin at periphery debrided sharply Because of the missing skin I was unable to create a good cosmetic result.  There is an improvement.  There is still some open skin the left to heal by secondary intent. Medications Ordered in UC Medications  Tdap (BOOSTRIX) injection 0.5 mL (0.5 mLs Intramuscular Given 02/08/20 1717)    Initial Impression / Assessment and Plan / UC Course  I have reviewed the triage vital signs and the nursing notes.  Pertinent labs & imaging results that were available during  my care of the patient were reviewed by me and considered in my medical decision making (see chart for details).      Final Clinical Impressions(s) / UC Diagnoses   Final diagnoses:  Laceration of left ear, initial encounter     Discharge Instructions     Use ice to area Keep clean and dry Come back Monday for wound check    ED Prescriptions    None     I have reviewed the PDMP during this encounter.   Raylene Everts, MD 02/08/20 [phone]

## 2020-02-08 NOTE — Telephone Encounter (Signed)
Notified patient's husband that patient will need a lab appointment for a B12 level before restart the B12 injections

## 2020-02-08 NOTE — Telephone Encounter (Signed)
Message left for patient to return my call.  

## 2020-02-10 ENCOUNTER — Other Ambulatory Visit: Payer: Self-pay | Admitting: Primary Care

## 2020-02-10 DIAGNOSIS — M4316 Spondylolisthesis, lumbar region: Secondary | ICD-10-CM

## 2020-02-11 ENCOUNTER — Encounter (HOSPITAL_COMMUNITY): Payer: Self-pay

## 2020-02-11 ENCOUNTER — Ambulatory Visit (HOSPITAL_COMMUNITY)
Admission: RE | Admit: 2020-02-11 | Discharge: 2020-02-11 | Disposition: A | Discharge status: Home / Self Care | Payer: Medicare Other | Source: Ambulatory Visit | Attending: Primary Care | Admitting: Primary Care

## 2020-02-11 ENCOUNTER — Other Ambulatory Visit: Payer: Self-pay

## 2020-02-11 VITALS — BP 137/80 | HR 87 | Temp 98.3°F | Resp 14

## 2020-02-11 DIAGNOSIS — Z5189 Encounter for other specified aftercare: Secondary | ICD-10-CM

## 2020-02-11 NOTE — ED Triage Notes (Signed)
Pt presents for follow up on her left ear she states it is very painful. Pt was told to come for a recheck today.

## 2020-02-11 NOTE — ED Provider Notes (Signed)
[facility]    CSN: [id number] Arrival date & time: 02/11/20  0845      History   Chief Complaint Chief Complaint  Patient presents with  . Appointment  . Follow-up    HPI Katherine [name] is a 69 y.o. female.   HPI Patient here for wound check. Seen here on 02/08/20 following a fall in which she sustained a laceration to left ear. Injury was repaired with sutures. She has not had any severe bleeding. She has changed dressing daily since repair. No fever, chills, N&V.  Past Medical History:  Diagnosis Date  . Anemia 2018  . Anxiety   . Arthritis   . Borderline diabetes   . Chest pain    a. 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 Chicken pox    age 22  . Chronic back pain    Compressed discs. [place]  . Depression   . Dyspnea on exertion    a. 03/2009 Echo: EF 65%, no rwma, triv TR.  Marland Kitchen GERD (gastroesophageal reflux disease)   . High cholesterol   . Hypertension   . Hypokalemia   . Migraine headache   . Obesity   . Parkinson's disease ([place])   . Pre-diabetes    okay now     Patient Active Problem List   Diagnosis Date Noted  . Cough 02/06/2020  . Parkinson's disease ([place]) 07/11/2019  . Leukocytosis 07/11/2019  . Rash and nonspecific skin eruption 02/14/2019  . Dizziness 12/14/2018  . Osteopenia 07/07/2018  . Gastroesophageal reflux disease 03/13/2018  . Chronic migraine without aura without status migrainosus, not intractable 03/13/2018  . Constipation 02/15/2017  . Spondylolisthesis of lumbar region 07/15/2016  . Vitamin D deficiency 03/11/2016  . Lumbar stenosis with neurogenic claudication 01/12/2016  . Notalgia 01/08/2016  . Restless leg syndrome 03/13/2015  . Preventative health care 12/10/2014  . Other fatigue 11/04/2014  . Anemia 11/04/2014  . Depression 10/31/2014  . Prediabetes 10/31/2014  . Obstructive sleep apnea 05/01/2009   . Hyperlipidemia 09/04/2008  . Obesity 09/04/2008  . HYPERTENSION, BENIGN 09/04/2008    Past Surgical History:  Procedure Laterality Date  . BLADDER SUSPENSION  2005  . CARDIAC CATHETERIZATION    . CESAREAN SECTION    . CLOSED REDUCTION NASAL FRACTURE N/A 01/11/2020   Procedure: CLOSED REDUCTION NASAL FRACTURE;  Surgeon: Ayn Gust, MD;  Location: [facility];  Service: ENT;  Laterality: N/A;  . ENDOMETRIAL ABLATION  2007  . FOOT SURGERY     bilateral bunions  . LUMBAR LAMINECTOMY/DECOMPRESSION MICRODISCECTOMY Bilateral 01/12/2016   Procedure: Bilateral L4-5 Laminotomy/Foraminotomy;  Surgeon: Newman Pies, MD;  Location: [facility];  Service: Neurosurgery;  Laterality: Bilateral;  Bilateral L4-5 Laminotomy/Foraminotomy  . TONSILLECTOMY  1971    OB History   No obstetric history on file.      Home Medications    Prior to Admission medications   Medication Sig Start Date End Date Taking? Authorizing Provider  acetaminophen (TYLENOL) 325 MG tablet Take 650 mg by mouth every 6 (six) hours as needed.   Yes [provider]  buPROPion (WELLBUTRIN XL) 150 MG 24 hr tablet Take 150 mg by mouth at bedtime.   Yes [provider]  carbidopa-levodopa (SINEMET IR) 25-100 MG tablet Take 1 tablet by mouth 3 (three) times daily.   Yes [provider]  carvedilol (COREG) 6.25 MG tablet Take 1 tablet (6.25 mg total)  by mouth 2 (two) times daily. 03/06/19  Yes Minna Merritts, MD  cetirizine (ZYRTEC) 10 MG tablet Take 10 mg by mouth daily as needed for allergies.   Yes [provider]  Cyanocobalamin (VITAMIN B-12 PO) Take by mouth daily.   Yes [provider]  diazepam (VALIUM) 10 MG tablet Take 10 mg by mouth at bedtime. 02/01/18  Yes [provider]  DULoxetine (CYMBALTA) 30 MG capsule Take 30 mg by mouth daily. (Total dose = 150 mg)   Yes [provider]  DULoxetine (CYMBALTA) 60 MG capsule Take 120 mg by mouth  every morning. (Total dose = 150 mg) 07/10/19  Yes [provider]  gabapentin (NEURONTIN) 300 MG capsule Take 1 capsule (300 mg total) by mouth 3 (three) times daily. For back pain. 10/12/19  Yes [name], NP  Melatonin 12 MG TABS Take by mouth at bedtime as needed.   Yes [provider]  Multiple Vitamin (MULTIVITAMIN) tablet Take 1 tablet by mouth daily.   Yes [provider]  ondansetron (ZOFRAN) 8 MG tablet Take 8 mg by mouth every 8 (eight) hours as needed. 11/14/19  Yes [provider]  OVER THE COUNTER MEDICATION Olly Beauty vitamin   Yes [provider]  pantoprazole (PROTONIX) 40 MG tablet TAKE 1 TABLET BY MOUTH EVERY DAY 10/25/19  Yes [name], NP  rivastigmine (EXELON) 1.5 MG capsule Take 1.5 mg by mouth 2 (two) times daily.  09/26/19  Yes [provider]  spironolactone (ALDACTONE) 25 MG tablet TAKE 1 TABLET BY MOUTH TWICE A DAY 09/17/19  Yes Gollan, Kathlene November, MD  sulfamethoxazole-trimethoprim (BACTRIM DS) 800-160 MG tablet Take 1 tablet by mouth twice daily as needed after intercourse to prevent UTI. 02/08/20  Yes [name], NP  SUMAtriptan (IMITREX) 100 MG tablet Take by mouth. 11/14/19 11/13/20 Yes [provider]  topiramate (TOPAMAX) 50 MG tablet Take 50 mg by mouth daily.    Yes [provider]  traMADol (ULTRAM) 50 MG tablet Take 1 tablet (50 mg total) by mouth every 6 (six) hours as needed. 01/11/20 01/10/21 Yes Adellyn Gust, MD  simvastatin (ZOCOR) 20 MG tablet Take 1 tablet (20 mg total) by mouth at bedtime. 08/28/19 01/07/20  Minna Merritts, MD    Family History Family History  Problem Relation Age of Onset  . Heart disease Mother   . Hypertension Mother   . Heart failure Mother        CHF  . Diabetes Mother   . Cancer Mother        CERVICAL CANCER  . Heart disease Father   . Diabetes Father   . Liver disease Father   . Hypertension Father   . Diabetes Sister   . Breast  cancer Sister 13  . Heart disease Brother   . Heart failure Brother        CHF  . Diabetes Brother   . Cancer Brother 62       THROAT AND NECK  . Esophageal cancer Brother   . Cancer Maternal Grandfather   . Colon cancer Neg Hx   . Stomach cancer Neg Hx   . Rectal cancer Neg Hx     Social History Social History   Tobacco Use  . Smoking status: Never Smoker  . Smokeless tobacco: Never Used  Vaping Use  . Vaping Use: Never used  Substance Use Topics  . Alcohol use: No  . Drug use: No  Allergies   Dilaudid [hydromorphone], Codeine, Darvon, Hydrocodone, Meperidine, Oxycodone, Tramadol, and Victoza [liraglutide]   Review of Systems Review of Systems Pertinent negatives listed in HPI  Physical Exam Triage Vital Signs ED Triage Vitals  Enc Vitals Group     BP 02/11/20 0944 (!) 137/80     Pulse Rate 02/11/20 0944 87     Resp 02/11/20 0944 14     Temp 02/11/20 0944 98.3 F (36.8 C)     Temp Source 02/11/20 0944 Oral     SpO2 02/11/20 0944 97 %     Weight --      Height --      Head Circumference --      Peak Flow --      Pain Score 02/11/20 0911 8     Pain Loc --      Pain Edu? --      Excl. in [place]? --    No data found.  Updated Vital Signs BP (!) 137/80 (BP Location: Right Arm)   Pulse 87   Temp 98.3 F (36.8 C) (Oral)   Resp 14   SpO2 97%   Visual Acuity Right Eye Distance:   Left Eye Distance:   Bilateral Distance:    Right Eye Near:   Left Eye Near:    Bilateral Near:     Physical Exam General appearance: alert, well developed, well nourished, cooperative and in no distress Head: Normocephalic, without obvious abnormality, atraumatic Ear: Left -sutures intact. No evidence of infection or drainage Skin: Skin color, texture, turgor normal. No rashes seen  Psych: Appropriate mood and affect. Neurologic:  Alert, oriented to person, place, and time, thought content appropriate.   UC Treatments / Results  Labs (all labs ordered are listed,  but only abnormal results are displayed) Labs Reviewed - No data to display  EKG   Radiology No results found.  Procedures Procedures (including critical care time)  Medications Ordered in UC Medications - No data to display  Initial Impression / Assessment and Plan / UC Course  I have reviewed the triage vital signs and the nursing notes.  Pertinent labs & imaging results that were available during my care of the patient were reviewed by me and considered in my medical decision making (see chart for details).     Wound check. Sutures intact. No evidence of infection. Continue topical antibiotic. Return in 5 days for suture removal. Final Clinical Impressions(s) / UC Diagnoses   Final diagnoses:  Encounter for wound re-check   Discharge Instructions   None    ED Prescriptions    None     PDMP not reviewed this encounter.   Scot Jun, FNP 02/11/20 [phone]

## 2020-02-12 NOTE — Telephone Encounter (Signed)
Last prescribed on 10/12/2019 Last OV (video visit) with [name] on 02/06/2020  No future OV scheduled

## 2020-02-13 ENCOUNTER — Other Ambulatory Visit: Payer: Self-pay | Admitting: Primary Care

## 2020-02-13 ENCOUNTER — Encounter: Payer: Self-pay | Admitting: Primary Care

## 2020-02-13 ENCOUNTER — Ambulatory Visit (INDEPENDENT_AMBULATORY_CARE_PROVIDER_SITE_OTHER): Payer: Medicare Other | Admitting: Primary Care

## 2020-02-13 ENCOUNTER — Other Ambulatory Visit: Payer: Self-pay

## 2020-02-13 VITALS — BP 128/80 | HR 100 | Temp 95.2°F | Ht 61.0 in | Wt 185.5 lb

## 2020-02-13 DIAGNOSIS — R300 Dysuria: Secondary | ICD-10-CM

## 2020-02-13 DIAGNOSIS — N3090 Cystitis, unspecified without hematuria: Secondary | ICD-10-CM | POA: Insufficient documentation

## 2020-02-13 LAB — POC URINALSYSI DIPSTICK (AUTOMATED)
Glucose, UA: NEGATIVE
Nitrite, UA: NEGATIVE
Protein, UA: POSITIVE — AB
Spec Grav, UA: 1.025 (ref 1.010–1.025)
Urobilinogen, UA: 1 E.U./dL
pH, UA: 6 (ref 5.0–8.0)

## 2020-02-13 NOTE — Telephone Encounter (Signed)
Message left for patient to return my call.  

## 2020-02-13 NOTE — Assessment & Plan Note (Addendum)
Acute symptoms x 2 weeks, worse over the last one week. Mental status has declined recently which could be secondary to acute cystitis, especially with combination of Lewy Body Dementia.  UA today with 1+ leuks, 1+ blood  She has the Bactrim DS prescription available at home, we discussed to take 1 tablet BID until complete.  Urine culture sent and pending. Her husband will update next week.

## 2020-02-13 NOTE — Progress Notes (Signed)
Subjective:    Patient ID: Katherine [name], female    DOB: 1951/05/30, 69 y.o.   MRN: [id number]  HPI  This visit occurred during the SARS-CoV-2 public health emergency.  Safety protocols were in place, including screening questions prior to the visit, additional usage of staff PPE, and extensive cleaning of exam room while observing appropriate contact time as indicated for disinfecting solutions.   Katherine [name] is a 69 year old female with a history of hypertension, chronic back pain, Parkinson's Disease, Lewy Body Dementia, prediabetes, UTI, restless legs, recurrent falls who presents today with a chief complaint of dysuria.  She also reports urinary frequency and a darker color to her urine. Her husband endorses delusions, hallucinations, increased confusion.  She denies pelvic pain, fevers. Symptoms began about 2 weeks ago.  She did pick up her Bactrim DS prescription and took one tablet last night. She has a remote history of recurrent UTI's in her 32's and 40's, no problems since menopause.   Review of Systems  Constitutional: Negative for fever.  Gastrointestinal: Negative for abdominal pain and nausea.  Genitourinary: Positive for dysuria and frequency. Negative for hematuria and vaginal discharge.       Past Medical History:  Diagnosis Date  . Anemia 2018  . Anxiety   . Arthritis   . Borderline diabetes   . Chest pain    a. 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 Chicken pox    age 58  . Chronic back pain    Compressed discs. [place]  . Depression   . Dyspnea on exertion    a. 03/2009 Echo: EF 65%, no rwma, triv TR.  Marland Kitchen GERD (gastroesophageal reflux disease)   . High cholesterol   . Hypertension   . Hypokalemia   . Migraine headache   . Obesity   . Parkinson's disease ([place])   . Pre-diabetes    okay now      Social History   Socioeconomic History  .  Marital status: Married    Spouse name: Not on file  . Number of children: Not on file  . Years of education: Not on file  . Highest education level: Not on file  Occupational History  . Occupation: Product manager: [employer]  Tobacco Use  . Smoking status: Never Smoker  . Smokeless tobacco: Never Used  Vaping Use  . Vaping Use: Never used  Substance and Sexual Activity  . Alcohol use: No  . Drug use: No  . Sexual activity: Yes    Partners: Male    Birth control/protection: Post-menopausal  Other Topics Concern  . Not on file  Social History Narrative   Married.   One son is 6, lives in [place].   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   Enjoys substitute teaching, gardening, cooking.   Social Determinants of Health   Financial Resource Strain: Low Risk   . Difficulty of Paying Living Expenses: Not hard at all  Food Insecurity: No Food Insecurity  . Worried About Charity fundraiser in the Last Year: Never true  . Ran Out of Food in the Last Year: Never true  Transportation Needs: No Transportation Needs  . Lack of Transportation (Medical): No  . Lack of Transportation (Non-Medical): No  Physical Activity: Inactive  . Days of Exercise per Week: 0 days  . Minutes of Exercise per Session: 0 min  Stress: Stress Concern  Present  . Feeling of Stress : To some extent  Social Connections:   . Frequency of Communication with Friends and Family:   . Frequency of Social Gatherings with Friends and Family:   . Attends Religious Services:   . Active Member of Clubs or Organizations:   . Attends Archivist Meetings:   Marland Kitchen Marital Status:   Intimate Partner Violence: Not At Risk  . Fear of Current or Ex-Partner: No  . Emotionally Abused: No  . Physically Abused: No  . Sexually Abused: No    Past Surgical History:  Procedure Laterality Date  . BLADDER SUSPENSION  2005  . CARDIAC CATHETERIZATION    . CESAREAN SECTION    . CLOSED REDUCTION NASAL FRACTURE N/A  01/11/2020   Procedure: CLOSED REDUCTION NASAL FRACTURE;  Surgeon: Adaline Gust, MD;  Location:  [facility];  Service: ENT;  Laterality: N/A;  . ENDOMETRIAL ABLATION  2007  . FOOT SURGERY     bilateral bunions  . LUMBAR LAMINECTOMY/DECOMPRESSION MICRODISCECTOMY Bilateral 01/12/2016   Procedure: Bilateral L4-5 Laminotomy/Foraminotomy;  Surgeon: Newman Pies, MD;  Location: [place];  Service: Neurosurgery;  Laterality: Bilateral;  Bilateral L4-5 Laminotomy/Foraminotomy  . TONSILLECTOMY  1971    Family History  Problem Relation Age of Onset  . Heart disease Mother   . Hypertension Mother   . Heart failure Mother        CHF  . Diabetes Mother   . Cancer Mother        CERVICAL CANCER  . Heart disease Father   . Diabetes Father   . Liver disease Father   . Hypertension Father   . Diabetes Sister   . Breast cancer Sister 97  . Heart disease Brother   . Heart failure Brother        CHF  . Diabetes Brother   . Cancer Brother 8       THROAT AND NECK  . Esophageal cancer Brother   . Cancer Maternal Grandfather   . Colon cancer Neg Hx   . Stomach cancer Neg Hx   . Rectal cancer Neg Hx     Allergies  Allergen Reactions  . Dilaudid [Hydromorphone]     hallucinations  . Codeine Anxiety  . Darvon Anxiety  . Hydrocodone Anxiety  . Meperidine Anxiety  . Oxycodone Anxiety  . Tramadol Anxiety  . Victoza [Liraglutide] Nausea Only and Other (See Comments)    And weakness    Current Outpatient Medications on File Prior to Visit  Medication Sig Dispense Refill  . acetaminophen (TYLENOL) 325 MG tablet Take 650 mg by mouth every 6 (six) hours as needed.    Marland Kitchen buPROPion (WELLBUTRIN XL) 150 MG 24 hr tablet Take 150 mg by mouth at bedtime.    . carbidopa-levodopa (SINEMET IR) 25-100 MG tablet Take 1 tablet by mouth 3 (three) times daily.    . carvedilol (COREG) 6.25 MG tablet Take 1 tablet (6.25 mg total) by mouth 2 (two) times daily. 180 tablet 3  . cetirizine (ZYRTEC)  10 MG tablet Take 10 mg by mouth daily as needed for allergies.    . Cyanocobalamin (VITAMIN B-12 PO) Take by mouth daily.    . diazepam (VALIUM) 10 MG tablet Take 10 mg by mouth at bedtime.  2  . DULoxetine (CYMBALTA) 30 MG capsule Take 30 mg by mouth daily. (Total dose = 150 mg)    . DULoxetine (CYMBALTA) 60 MG capsule Take 120 mg by mouth every morning. (Total dose =  150 mg)    . gabapentin (NEURONTIN) 300 MG capsule Take 1 capsule (300 mg total) by mouth 3 (three) times daily. For back pain. 90 capsule 0  . Melatonin 12 MG TABS Take by mouth at bedtime as needed.    . Multiple Vitamin (MULTIVITAMIN) tablet Take 1 tablet by mouth daily.    . ondansetron (ZOFRAN) 8 MG tablet Take 8 mg by mouth every 8 (eight) hours as needed.    Marland Kitchen OVER THE COUNTER MEDICATION Olly Beauty vitamin    . pantoprazole (PROTONIX) 40 MG tablet TAKE 1 TABLET BY MOUTH EVERY DAY 90 tablet 1  . simvastatin (ZOCOR) 20 MG tablet Take 1 tablet (20 mg total) by mouth at bedtime. 90 tablet 3  . spironolactone (ALDACTONE) 25 MG tablet TAKE 1 TABLET BY MOUTH TWICE A DAY 60 tablet 5  . sulfamethoxazole-trimethoprim (BACTRIM DS) 800-160 MG tablet Take 1 tablet by mouth twice daily as needed after intercourse to prevent UTI. 10 tablet 0  . SUMAtriptan (IMITREX) 100 MG tablet Take by mouth.    . topiramate (TOPAMAX) 50 MG tablet Take 50 mg by mouth daily.     . traMADol (ULTRAM) 50 MG tablet Take 1 tablet (50 mg total) by mouth every 6 (six) hours as needed. 30 tablet 0   No current facility-administered medications on file prior to visit.    BP 128/80   Pulse 100   Temp (!) 95.2 F (35.1 C) (Temporal)   Ht 5\' 1"  (1.549 m)   Wt 185 lb 8 oz (84.1 kg)   SpO2 97%   BMI 35.05 kg/m    Objective:   Physical Exam Cardiovascular:     Rate and Rhythm: Normal rate and regular rhythm.  Pulmonary:     Effort: Pulmonary effort is normal.     Breath sounds: Normal breath sounds.  Musculoskeletal:     Cervical back: Neck supple.   Skin:    General: Skin is warm and dry.            Assessment & Plan:

## 2020-02-13 NOTE — Patient Instructions (Signed)
Start Bactrim DS (sulfamethoxazole/trimethoprim) tablets for urinary tract infection. Take 1 tablet by mouth twice daily for 5 days.  Schedule an appointment for Friday this week for suture removal.   It was a pleasure to see you today!

## 2020-02-13 NOTE — Telephone Encounter (Signed)
Please notify patient and her husband:   Based off of Dr. Trena Platt note from late June, he thinks she's taking gabapentin 300 mg at bedtime for back pain. Given her hallucinations/confusion, I think this is more reasonable rather than taking TID. Let me know if they are okay with this.

## 2020-02-14 ENCOUNTER — Other Ambulatory Visit: Payer: Self-pay

## 2020-02-14 ENCOUNTER — Ambulatory Visit
Admission: RE | Admit: 2020-02-14 | Discharge: 2020-02-14 | Disposition: A | Discharge status: Home / Self Care | Payer: Medicare Other | Source: Ambulatory Visit | Attending: Neurology | Admitting: Neurology

## 2020-02-14 DIAGNOSIS — F02818 Dementia in other diseases classified elsewhere, unspecified severity, with other behavioral disturbance: Secondary | ICD-10-CM

## 2020-02-14 DIAGNOSIS — F22 Delusional disorders: Secondary | ICD-10-CM | POA: Diagnosis not present

## 2020-02-14 DIAGNOSIS — R42 Dizziness and giddiness: Secondary | ICD-10-CM | POA: Diagnosis not present

## 2020-02-14 DIAGNOSIS — F0281 Dementia in other diseases classified elsewhere with behavioral disturbance: Secondary | ICD-10-CM | POA: Diagnosis not present

## 2020-02-14 DIAGNOSIS — R441 Visual hallucinations: Secondary | ICD-10-CM | POA: Diagnosis not present

## 2020-02-14 DIAGNOSIS — R2689 Other abnormalities of gait and mobility: Secondary | ICD-10-CM | POA: Diagnosis not present

## 2020-02-14 DIAGNOSIS — R278 Other lack of coordination: Secondary | ICD-10-CM | POA: Diagnosis not present

## 2020-02-14 DIAGNOSIS — G936 Cerebral edema: Secondary | ICD-10-CM | POA: Diagnosis not present

## 2020-02-14 DIAGNOSIS — E519 Thiamine deficiency, unspecified: Secondary | ICD-10-CM | POA: Diagnosis not present

## 2020-02-14 DIAGNOSIS — G3183 Dementia with Lewy bodies: Secondary | ICD-10-CM | POA: Diagnosis not present

## 2020-02-14 DIAGNOSIS — E538 Deficiency of other specified B group vitamins: Secondary | ICD-10-CM | POA: Diagnosis not present

## 2020-02-14 DIAGNOSIS — J3489 Other specified disorders of nose and nasal sinuses: Secondary | ICD-10-CM | POA: Diagnosis not present

## 2020-02-14 DIAGNOSIS — H8190 Unspecified disorder of vestibular function, unspecified ear: Secondary | ICD-10-CM | POA: Diagnosis not present

## 2020-02-14 DIAGNOSIS — J320 Chronic maxillary sinusitis: Secondary | ICD-10-CM | POA: Diagnosis not present

## 2020-02-14 NOTE — Telephone Encounter (Signed)
Noted, refill sent to pharmacy. 

## 2020-02-14 NOTE — Telephone Encounter (Signed)
Patient's husband that they are okay with the change

## 2020-02-14 NOTE — Telephone Encounter (Signed)
Called patient and LVM to call back and schedule labs.

## 2020-02-15 ENCOUNTER — Ambulatory Visit (INDEPENDENT_AMBULATORY_CARE_PROVIDER_SITE_OTHER): Payer: Medicare Other | Admitting: Family Medicine

## 2020-02-15 ENCOUNTER — Encounter: Payer: Self-pay | Admitting: Family Medicine

## 2020-02-15 VITALS — BP 110/70 | HR 84 | Temp 97.9°F | Ht 61.0 in | Wt 187.5 lb

## 2020-02-15 DIAGNOSIS — S01312D Laceration without foreign body of left ear, subsequent encounter: Secondary | ICD-10-CM

## 2020-02-15 LAB — URINE CULTURE
MICRO NUMBER:: 10759981
SPECIMEN QUALITY:: ADEQUATE

## 2020-02-15 NOTE — Progress Notes (Signed)
Chief Complaint  Patient presents with  . Suture / Staple Removal    History of Present Illness: HPI  69 year old female patient  Of [facility] Millers with parkinson's disease and  Lewy Body dementia presents with her husband for suture removal.  Went to ED on 02/08/20 following fall... resulting in laceration of left ear. Repaired with 7 simple interuppt.  No discharge, no oozing  Or bleeding. No fever.  Ear remains somewhat sore but decreasing.  Keeping area clean and dry.  This visit occurred during the SARS-CoV-2 public health emergency.  Safety protocols were in place, including screening questions prior to the visit, additional usage of staff PPE, and extensive cleaning of exam room while observing appropriate contact time as indicated for disinfecting solutions.   COVID 19 screen:  No recent travel or known exposure to COVID19 The patient denies respiratory symptoms of COVID 19 at this time. The importance of social distancing was discussed today.     Review of Systems  Constitutional: Negative for chills and fever.  HENT: Negative for congestion and ear pain.   Eyes: Negative for pain and redness.  Respiratory: Negative for cough and shortness of breath.   Cardiovascular: Negative for chest pain, palpitations and leg swelling.  Gastrointestinal: Negative for abdominal pain, blood in stool, constipation, diarrhea, nausea and vomiting.  Genitourinary: Negative for dysuria.  Musculoskeletal: Negative for falls and myalgias.  Skin: Negative for rash.  Neurological: Negative for dizziness.  Psychiatric/Behavioral: Negative for depression. The patient is not nervous/anxious.       Past Medical History:  Diagnosis Date  . Anemia 2018  . Anxiety   . Arthritis   . Borderline diabetes   . Chest pain    a. 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 Chicken pox    age 26  . Chronic back pain     Compressed discs. [place]  . Depression   . Dyspnea on exertion    a. 03/2009 Echo: EF 65%, no rwma, triv TR.  Marland Kitchen GERD (gastroesophageal reflux disease)   . High cholesterol   . Hypertension   . Hypokalemia   . Migraine headache   . Obesity   . Parkinson's disease ([place])   . Pre-diabetes    okay now     reports that she has never smoked. She has never used smokeless tobacco. She reports that she does not drink alcohol and does not use drugs.   Current Outpatient Medications:  .  acetaminophen (TYLENOL) 325 MG tablet, Take 650 mg by mouth every 6 (six) hours as needed., Disp: , Rfl:  .  buPROPion (WELLBUTRIN XL) 150 MG 24 hr tablet, Take 150 mg by mouth at bedtime., Disp: , Rfl:  .  carbidopa-levodopa (SINEMET IR) 25-100 MG tablet, Take 1 tablet by mouth 3 (three) times daily., Disp: , Rfl:  .  carvedilol (COREG) 6.25 MG tablet, Take 1 tablet (6.25 mg total) by mouth 2 (two) times daily., Disp: 180 tablet, Rfl: 3 .  cetirizine (ZYRTEC) 10 MG tablet, Take 10 mg by mouth daily as needed for allergies., Disp: , Rfl:  .  Cyanocobalamin (VITAMIN B-12 PO), Take by mouth daily., Disp: , Rfl:  .  diazepam (VALIUM) 10 MG tablet, Take 10 mg by mouth at bedtime., Disp: , Rfl: 2 .  DULoxetine (CYMBALTA) 30 MG capsule, Take 30 mg by mouth daily. (Total dose = 150 mg), Disp: , Rfl:  .  DULoxetine (CYMBALTA) 60 MG capsule, Take 120 mg by mouth every morning. (Total dose = 150 mg), Disp: , Rfl:  .  gabapentin (NEURONTIN) 300 MG capsule, Take 1 capsule (300 mg total) by mouth at bedtime. For back pain., Disp: 90 capsule, Rfl: 1 .  Melatonin 12 MG TABS, Take by mouth at bedtime as needed., Disp: , Rfl:  .  Multiple Vitamin (MULTIVITAMIN) tablet, Take 1 tablet by mouth daily., Disp: , Rfl:  .  ondansetron (ZOFRAN) 8 MG tablet, Take 8 mg by mouth every 8 (eight) hours as needed., Disp: , Rfl:  .  OVER THE COUNTER MEDICATION, Olly Beauty vitamin, Disp: , Rfl:  .  pantoprazole  (PROTONIX) 40 MG tablet, TAKE 1 TABLET BY MOUTH EVERY DAY, Disp: 90 tablet, Rfl: 1 .  QUEtiapine (SEROQUEL) 50 MG tablet, Take 50 mg by mouth in the morning and at bedtime., Disp: , Rfl:  .  spironolactone (ALDACTONE) 25 MG tablet, TAKE 1 TABLET BY MOUTH TWICE A DAY, Disp: 60 tablet, Rfl: 5 .  sulfamethoxazole-trimethoprim (BACTRIM DS) 800-160 MG tablet, Take 1 tablet by mouth twice daily as needed after intercourse to prevent UTI., Disp: 10 tablet, Rfl: 0 .  SUMAtriptan (IMITREX) 100 MG tablet, Take by mouth., Disp: , Rfl:  .  topiramate (TOPAMAX) 50 MG tablet, Take 50 mg by mouth daily. , Disp: , Rfl:  .  traMADol (ULTRAM) 50 MG tablet, Take 1 tablet (50 mg total) by mouth every 6 (six) hours as needed., Disp: 30 tablet, Rfl: 0 .  simvastatin (ZOCOR) 20 MG tablet, Take 1 tablet (20 mg total) by mouth at bedtime., Disp: 90 tablet, Rfl: 3   Observations/Objective: Blood pressure 110/70, pulse 84, temperature 97.9 F (36.6 C), temperature source Temporal, height 5\' 1"  (1.549 m), weight 187 lb 8 oz (85 kg), SpO2 99 %.  Physical Exam Constitutional:      General: She is not in acute distress.    Appearance: Normal appearance. She is well-developed. She is not ill-appearing or toxic-appearing.  HENT:     Head: Normocephalic.     Right Ear: Hearing, tympanic membrane, ear canal and external ear normal. Tympanic membrane is not erythematous, retracted or bulging.     Left Ear: Hearing, tympanic membrane, ear canal and external ear normal. Tympanic membrane is not erythematous, retracted or bulging.     Nose: No mucosal edema or rhinorrhea.     Right Sinus: No maxillary sinus tenderness or frontal sinus tenderness.     Left Sinus: No maxillary sinus tenderness or frontal sinus tenderness.     Mouth/Throat:     Pharynx: Uvula midline.  Eyes:     General: Lids are normal. Lids are everted, no foreign bodies appreciated.     Conjunctiva/sclera: Conjunctivae normal.     Pupils: Pupils are equal,  round, and reactive to light.  Neck:     Thyroid: No thyroid mass or thyromegaly.     Vascular: No carotid bruit.     Trachea: Trachea normal.  Cardiovascular:     Rate and Rhythm: Normal rate and regular rhythm.     Pulses: Normal pulses.     Heart sounds: Normal heart sounds, S1 normal and S2 normal. No murmur heard.  No friction rub. No gallop.   Pulmonary:     Effort: Pulmonary effort is normal. No tachypnea or respiratory distress.     Breath sounds: Normal breath sounds. No decreased breath sounds, wheezing, rhonchi or rales.  Abdominal:     General: Bowel  sounds are normal.     Palpations: Abdomen is soft.     Tenderness: There is no abdominal tenderness.  Musculoskeletal:     Cervical back: Normal range of motion and neck supple.  Skin:    General: Skin is warm and dry.     Findings: No rash.     Comments: Healing laceration on left pinna  Neurological:     Mental Status: She is alert.  Psychiatric:        Mood and Affect: Mood is not anxious or depressed.        Speech: Speech normal.        Behavior: Behavior normal. Behavior is cooperative.        Thought Content: Thought content normal.        Judgment: Judgment normal.      Assessment and Plan   Procedure : Suture removal   7 inturrupted sutures removed with good results, minimal bleeding. Steri strip applied to hold wound closed. Care instructions given.  Eliezer Lofts, MD

## 2020-02-16 ENCOUNTER — Other Ambulatory Visit: Payer: Self-pay | Admitting: Primary Care

## 2020-02-16 DIAGNOSIS — N3001 Acute cystitis with hematuria: Secondary | ICD-10-CM

## 2020-02-16 MED ORDER — CEPHALEXIN 500 MG PO CAPS: 500.0000 mg | ORAL_CAPSULE | Freq: Two times a day (BID) | ORAL | 0 refills | Status: AC

## 2020-02-19 ENCOUNTER — Telehealth: Payer: Self-pay

## 2020-02-19 NOTE — Telephone Encounter (Signed)
Patient's husband returned phone call regarding urine test results. I notified him of this information and he verbalized understanding.  2 week f/u lab has been scheduled. Thanks!

## 2020-02-19 NOTE — Telephone Encounter (Signed)
Noted  

## 2020-02-22 ENCOUNTER — Other Ambulatory Visit: Payer: Self-pay | Admitting: Cardiovascular Disease

## 2020-02-22 NOTE — Telephone Encounter (Signed)
Please schedule F/U appointment with Dr. Gollan. Thank you! 

## 2020-02-22 NOTE — Telephone Encounter (Signed)
LVM to schedule f/u

## 2020-02-27 ENCOUNTER — Telehealth: Payer: Self-pay | Admitting: Cardiovascular Disease

## 2020-02-27 NOTE — Telephone Encounter (Signed)
LMOV to schedule  

## 2020-02-27 NOTE — Telephone Encounter (Signed)
-----   Message from Ronaldo Miyamoto, [place] sent at 02/27/2020 10:24 AM EDT ----- Could you please contact patient to schedule 1 year visit to continue getting refills on her medication?  Thank you

## 2020-02-29 ENCOUNTER — Ambulatory Visit: Payer: Medicare Other | Admitting: Family

## 2020-03-03 ENCOUNTER — Other Ambulatory Visit: Payer: Self-pay

## 2020-03-03 ENCOUNTER — Other Ambulatory Visit (INDEPENDENT_AMBULATORY_CARE_PROVIDER_SITE_OTHER): Payer: Medicare Other

## 2020-03-03 DIAGNOSIS — Z48813 Encounter for surgical aftercare following surgery on the respiratory system: Secondary | ICD-10-CM | POA: Diagnosis not present

## 2020-03-03 DIAGNOSIS — G20 Parkinson's disease: Secondary | ICD-10-CM | POA: Diagnosis not present

## 2020-03-03 DIAGNOSIS — N3001 Acute cystitis with hematuria: Secondary | ICD-10-CM

## 2020-03-03 DIAGNOSIS — F331 Major depressive disorder, recurrent, moderate: Secondary | ICD-10-CM | POA: Diagnosis not present

## 2020-03-03 DIAGNOSIS — H903 Sensorineural hearing loss, bilateral: Secondary | ICD-10-CM | POA: Diagnosis not present

## 2020-03-03 LAB — POC URINALSYSI DIPSTICK (AUTOMATED)
Bilirubin, UA: NEGATIVE
Blood, UA: NEGATIVE
Glucose, UA: NEGATIVE
Ketones, UA: NEGATIVE
Nitrite, UA: NEGATIVE
Protein, UA: POSITIVE — AB
Spec Grav, UA: 1.02 (ref 1.010–1.025)
Urobilinogen, UA: 0.2 E.U./dL
pH, UA: 6 (ref 5.0–8.0)

## 2020-03-05 ENCOUNTER — Ambulatory Visit: Payer: Medicare Other | Admitting: Nurse Practitioner

## 2020-03-05 LAB — URINE CULTURE
MICRO NUMBER:: 10830884
SPECIMEN QUALITY:: ADEQUATE

## 2020-03-07 ENCOUNTER — Telehealth: Payer: Self-pay | Admitting: Cardiovascular Disease

## 2020-03-07 ENCOUNTER — Other Ambulatory Visit: Payer: Self-pay | Admitting: Cardiovascular Disease

## 2020-03-07 NOTE — Telephone Encounter (Signed)
Patient cancelled upcoming visit and wants to wait 2 months   Will fu

## 2020-03-07 NOTE — Telephone Encounter (Signed)
-----   Message from Anselm Pancoast, [place] sent at 03/07/2020 11:51 AM EDT ----- Please contact patient for a follow up.  She is past due and is requesting a refill on her medication.  Thanks, SHaron

## 2020-03-17 ENCOUNTER — Other Ambulatory Visit: Payer: Medicare Other

## 2020-03-17 ENCOUNTER — Telehealth: Payer: Self-pay

## 2020-03-17 DIAGNOSIS — R3915 Urgency of urination: Secondary | ICD-10-CM | POA: Diagnosis not present

## 2020-03-17 NOTE — Telephone Encounter (Signed)
Yes, culture please.  I placed the order.

## 2020-03-17 NOTE — Telephone Encounter (Signed)
Patient left urine in drop box.  Please advise if we need to do a culture.  Attempted to contact patient for clarification, but no answer.

## 2020-03-18 DIAGNOSIS — G20 Parkinson's disease: Secondary | ICD-10-CM | POA: Diagnosis not present

## 2020-03-18 DIAGNOSIS — F0281 Dementia in other diseases classified elsewhere with behavioral disturbance: Secondary | ICD-10-CM | POA: Diagnosis not present

## 2020-03-18 DIAGNOSIS — G3183 Dementia with Lewy bodies: Secondary | ICD-10-CM | POA: Diagnosis not present

## 2020-03-18 DIAGNOSIS — F060 Psychotic disorder with hallucinations due to known physiological condition: Secondary | ICD-10-CM | POA: Diagnosis not present

## 2020-03-18 DIAGNOSIS — F062 Psychotic disorder with delusions due to known physiological condition: Secondary | ICD-10-CM | POA: Diagnosis not present

## 2020-03-19 ENCOUNTER — Ambulatory Visit: Payer: Medicare Other | Admitting: Nurse Practitioner

## 2020-03-20 ENCOUNTER — Other Ambulatory Visit: Payer: Self-pay | Admitting: Primary Care

## 2020-03-20 DIAGNOSIS — N3000 Acute cystitis without hematuria: Secondary | ICD-10-CM

## 2020-03-20 LAB — URINE CULTURE
MICRO NUMBER:: 10888331
SPECIMEN QUALITY:: ADEQUATE

## 2020-03-20 MED ORDER — CEPHALEXIN 500 MG PO CAPS: 500.0000 mg | ORAL_CAPSULE | Freq: Two times a day (BID) | ORAL | 0 refills | Status: AC

## 2020-03-25 ENCOUNTER — Other Ambulatory Visit: Payer: Self-pay | Admitting: Cardiovascular Disease

## 2020-03-28 ENCOUNTER — Other Ambulatory Visit: Payer: Self-pay | Admitting: Primary Care

## 2020-03-28 DIAGNOSIS — K219 Gastro-esophageal reflux disease without esophagitis: Secondary | ICD-10-CM

## 2020-03-30 DIAGNOSIS — Z20822 Contact with and (suspected) exposure to covid-19: Secondary | ICD-10-CM | POA: Diagnosis not present

## 2020-04-07 NOTE — Telephone Encounter (Signed)
Attempted to schedule.  LMOV to call office.  ° °

## 2020-04-11 ENCOUNTER — Other Ambulatory Visit: Payer: Self-pay | Admitting: Cardiovascular Disease

## 2020-04-15 ENCOUNTER — Other Ambulatory Visit: Payer: Self-pay

## 2020-04-15 ENCOUNTER — Encounter: Payer: Self-pay | Admitting: Primary Care

## 2020-04-15 ENCOUNTER — Ambulatory Visit (INDEPENDENT_AMBULATORY_CARE_PROVIDER_SITE_OTHER): Payer: Medicare Other | Admitting: Primary Care

## 2020-04-15 ENCOUNTER — Telehealth: Payer: Self-pay

## 2020-04-15 VITALS — BP 118/74 | HR 100 | Temp 96.6°F | Ht 61.0 in | Wt 179.0 lb

## 2020-04-15 DIAGNOSIS — E559 Vitamin D deficiency, unspecified: Secondary | ICD-10-CM

## 2020-04-15 DIAGNOSIS — R300 Dysuria: Secondary | ICD-10-CM

## 2020-04-15 DIAGNOSIS — G20A1 Parkinson's disease without dyskinesia, without mention of fluctuations: Secondary | ICD-10-CM

## 2020-04-15 DIAGNOSIS — E538 Deficiency of other specified B group vitamins: Secondary | ICD-10-CM | POA: Diagnosis not present

## 2020-04-15 DIAGNOSIS — F02818 Dementia in other diseases classified elsewhere, unspecified severity, with other behavioral disturbance: Secondary | ICD-10-CM

## 2020-04-15 DIAGNOSIS — Z23 Encounter for immunization: Secondary | ICD-10-CM

## 2020-04-15 DIAGNOSIS — E782 Mixed hyperlipidemia: Secondary | ICD-10-CM

## 2020-04-15 DIAGNOSIS — R7303 Prediabetes: Secondary | ICD-10-CM | POA: Diagnosis not present

## 2020-04-15 DIAGNOSIS — F0281 Dementia in other diseases classified elsewhere with behavioral disturbance: Secondary | ICD-10-CM | POA: Diagnosis not present

## 2020-04-15 DIAGNOSIS — G3183 Dementia with Lewy bodies: Secondary | ICD-10-CM | POA: Diagnosis not present

## 2020-04-15 DIAGNOSIS — E785 Hyperlipidemia, unspecified: Secondary | ICD-10-CM

## 2020-04-15 DIAGNOSIS — I10 Essential (primary) hypertension: Secondary | ICD-10-CM | POA: Diagnosis not present

## 2020-04-15 DIAGNOSIS — G20 Parkinson's disease: Secondary | ICD-10-CM | POA: Diagnosis not present

## 2020-04-15 DIAGNOSIS — N3000 Acute cystitis without hematuria: Secondary | ICD-10-CM

## 2020-04-15 DIAGNOSIS — R32 Unspecified urinary incontinence: Secondary | ICD-10-CM | POA: Insufficient documentation

## 2020-04-15 DIAGNOSIS — R419 Unspecified symptoms and signs involving cognitive functions and awareness: Secondary | ICD-10-CM | POA: Insufficient documentation

## 2020-04-15 DIAGNOSIS — F0280 Dementia in other diseases classified elsewhere without behavioral disturbance: Secondary | ICD-10-CM | POA: Insufficient documentation

## 2020-04-15 LAB — COMPREHENSIVE METABOLIC PANEL
ALT: 6 U/L (ref 0–35)
AST: 10 U/L (ref 0–37)
Albumin: 3.9 g/dL (ref 3.5–5.2)
Alkaline Phosphatase: 98 U/L (ref 39–117)
BUN: 19 mg/dL (ref 6–23)
CO2: 23 mEq/L (ref 19–32)
Calcium: 8.9 mg/dL (ref 8.4–10.5)
Chloride: 105 mEq/L (ref 96–112)
Creatinine, Ser: 0.8 mg/dL (ref 0.40–1.20)
GFR: 71.22 mL/min (ref >60.00)
Glucose, Bld: 99 mg/dL (ref 70–99)
Potassium: 4.4 mEq/L (ref 3.5–5.1)
Sodium: 135 mEq/L (ref 135–145)
Total Bilirubin: 0.5 mg/dL (ref 0.2–1.2)
Total Protein: 6.6 g/dL (ref 6.0–8.3)

## 2020-04-15 LAB — LIPID PANEL
Cholesterol: 130 mg/dL (ref 0–200)
HDL: 52.2 mg/dL (ref >39.00)
LDL Cholesterol: 60 mg/dL (ref 0–99)
NonHDL: 77.44
Total CHOL/HDL Ratio: 2
Triglycerides: 85 mg/dL (ref 0.0–149.0)
VLDL: 17 mg/dL (ref 0.0–40.0)

## 2020-04-15 LAB — CBC
HCT: 31.6 % — ABNORMAL LOW (ref 36.0–46.0)
Hemoglobin: 9.5 g/dL — ABNORMAL LOW (ref 12.0–15.0)
MCHC: 30.2 g/dL (ref 30.0–36.0)
MCV: 74.5 fl — ABNORMAL LOW (ref 78.0–100.0)
Platelets: 502 10*3/uL — ABNORMAL HIGH (ref 150.0–400.0)
RBC: 4.25 Mil/uL (ref 3.87–5.11)
RDW: 18.1 % — ABNORMAL HIGH (ref 11.5–15.5)
WBC: 19.5 10*3/uL (ref 4.0–10.5)

## 2020-04-15 LAB — POC URINALSYSI DIPSTICK (AUTOMATED)
Bilirubin, UA: NEGATIVE
Blood, UA: NEGATIVE
Glucose, UA: NEGATIVE
Ketones, UA: NEGATIVE
Nitrite, UA: NEGATIVE
Protein, UA: NEGATIVE
Spec Grav, UA: 1.01 (ref 1.010–1.025)
Urobilinogen, UA: 0.2 E.U./dL
pH, UA: 8 (ref 5.0–8.0)

## 2020-04-15 LAB — VITAMIN D 25 HYDROXY (VIT D DEFICIENCY, FRACTURES): VITD: 38.63 ng/mL (ref 30.00–100.00)

## 2020-04-15 LAB — VITAMIN B12: Vitamin B-12: 262 pg/mL (ref 211–911)

## 2020-04-15 LAB — HEMOGLOBIN A1C: Hgb A1c MFr Bld: 6.3 % (ref 4.6–6.5)

## 2020-04-15 MED ORDER — SULFAMETHOXAZOLE-TRIMETHOPRIM 800-160 MG PO TABS: 1.0000 | ORAL_TABLET | Freq: Two times a day (BID) | ORAL | 0 refills | Status: DC

## 2020-04-15 NOTE — Telephone Encounter (Signed)
[facility] called a critical result @ 1615  White count 19.5

## 2020-04-15 NOTE — Assessment & Plan Note (Signed)
Following with neurology, doing better on Nuplazid which was initiated 3 weeks ago. Today she appears well. Stable with walker in clinic today.

## 2020-04-15 NOTE — Addendum Note (Signed)
Addended by: [name] on: 04/15/2020 05:34 PM   Modules accepted: Orders

## 2020-04-15 NOTE — Assessment & Plan Note (Signed)
Two occurrences within the last 10 days. Unclear if this is secondary to cystitis or Parkinson's disease.  UA today with 1+ leuks, otherwise negative. Given her history of recurrent cystitis we will send off a culture.  She will also discuss her incontinence with her neurologist during her visit tomorrow.

## 2020-04-15 NOTE — Patient Instructions (Addendum)
We will be in touch with your urine culture results Thursday or Friday this week.   Please speak with your neurologist regarding the urinary incontinence.   It was a pleasure to see you today!   Influenza (Flu) Vaccine (Inactivated or Recombinant): What You Need to Know 1. Why get vaccinated? Influenza vaccine can prevent influenza (flu). Flu is a contagious disease that spreads around the [place] every year, usually between October and May. Anyone can get the flu, but it is more dangerous for some people. Infants and young children, people 44 years of age and older, pregnant women, and people with certain health conditions or a weakened immune system are at greatest risk of flu complications. Pneumonia, bronchitis, sinus infections and ear infections are examples of flu-related complications. If you have a medical condition, such as heart disease, cancer or diabetes, flu can make it worse. Flu can cause fever and chills, sore throat, muscle aches, fatigue, cough, headache, and runny or stuffy nose. Some people may have vomiting and diarrhea, though this is more common in children than adults. Each year thousands of people in the [place] die from flu, and many more are hospitalized. Flu vaccine prevents millions of illnesses and flu-related visits to the doctor each year. 2. Influenza vaccine CDC recommends everyone 66 months of age and older get vaccinated every flu season. Children 6 months through 54 years of age may need 2 doses during a single flu season. Everyone else needs only 1 dose each flu season. It takes about 2 weeks for protection to develop after vaccination. There are many flu viruses, and they are always changing. Each year a new flu vaccine is made to protect against three or four viruses that are likely to cause disease in the upcoming flu season. Even when the vaccine doesn't exactly match these viruses, it may still provide some protection. Influenza vaccine does not  cause flu. Influenza vaccine may be given at the same time as other vaccines. 3. Talk with your health care provider Tell your vaccine provider if the person getting the vaccine:  Has had an allergic reaction after a previous dose of influenza vaccine, or has any severe, life-threatening allergies.  Has ever had Guillain-Barr Syndrome (also called GBS). In some cases, your health care provider may decide to postpone influenza vaccination to a future visit. People with minor illnesses, such as a cold, may be vaccinated. People who are moderately or severely ill should usually wait until they recover before getting influenza vaccine. Your health care provider can give you more information. 4. Risks of a vaccine reaction  Soreness, redness, and swelling where shot is given, fever, muscle aches, and headache can happen after influenza vaccine.  There may be a very small increased risk of Guillain-Barr Syndrome (GBS) after inactivated influenza vaccine (the flu shot). Young children who get the flu shot along with pneumococcal vaccine (PCV13), and/or DTaP vaccine at the same time might be slightly more likely to have a seizure caused by fever. Tell your health care provider if a child who is getting flu vaccine has ever had a seizure. People sometimes faint after medical procedures, including vaccination. Tell your provider if you feel dizzy or have vision changes or ringing in the ears. As with any medicine, there is a very remote chance of a vaccine causing a severe allergic reaction, other serious injury, or death. 5. What if there is a serious problem? An allergic reaction could occur after the vaccinated person leaves the clinic.  If you see signs of a severe allergic reaction (hives, swelling of the face and throat, difficulty breathing, a fast heartbeat, dizziness, or weakness), call 9-1-1 and get the person to the nearest hospital. For other signs that concern you, call your health care  provider. Adverse reactions should be reported to the Vaccine Adverse Event Reporting System (VAERS). Your health care provider will usually file this report, or you can do it yourself. Visit the VAERS website at www.vaers.SamedayNews.es or call 626-484-7387.VAERS is only for reporting reactions, and VAERS staff do not give medical advice. 6. The National Vaccine Injury Compensation Program The [employer] Vaccine Injury Compensation Program (VICP) is a federal program that was created to compensate people who may have been injured by certain vaccines. Visit the VICP website at GoldCloset.com.ee or call (936)055-9018 to learn about the program and about filing a claim. There is a time limit to file a claim for compensation. 7. How can I learn more?  Ask your healthcare provider.  Call your local or state health department.  Contact the Centers for Disease Control and Prevention (CDC): ? Call 819-884-8603 (1-800-CDC-INFO) or ? Visit CDC's https://gibson.com/ Vaccine Information Statement (Interim) Inactivated Influenza Vaccine (03/02/2018) This information is not intended to replace advice given to you by your health care provider. Make sure you discuss any questions you have with your health care provider. Document Revised: 10/24/2018 Document Reviewed: 03/06/2018 Elsevier Patient Education  [place].

## 2020-04-15 NOTE — Telephone Encounter (Signed)
Tam, can we add diff? Dx: Leukocytosis  Called both patient's phone number and her husband's phone number, left message on her husband's phone number critical blood cell count.  We will send in antibiotics for presumed cystitis infection, fortunately renal function is within normal range.  During her exam today she appeared very stable, no distress.  We will need to reach back out to her husband tomorrow to ensure he received my message this evening. Joellen, will you check on her?

## 2020-04-15 NOTE — Assessment & Plan Note (Signed)
Following with a specialist through [facility] for Lewy body dementia diagnosis. Recently initiated on Nuplazid and is doing better overall.

## 2020-04-15 NOTE — Progress Notes (Signed)
Subjective:    Patient ID: Katherine [name], female    DOB: 08-03-1950, 69 y.o.   MRN: [id number]  HPI  This visit occurred during the SARS-CoV-2 public health emergency.  Safety protocols were in place, including screening questions prior to the visit, additional usage of staff PPE, and extensive cleaning of exam room while observing appropriate contact time as indicated for disinfecting solutions.   Katherine [name] is a 69 year old female with a history of Parkinson's Disease, recurrent UTI, Lewy Body Dementia, hyperlipidemia, chronic migraines, depression who presents today with a chief complaint of urinary incontinence.  She experienced two episodes of urinary incontinence during two nights within the last 10 days. She did not notice the incontinence until waking the following mornings. The first incident she soaked her entire bed, the second incident was later.   She has no history of urinary incontinence in the past. She has never had a hysterectomy or problems with her bladder in general.  She does have a history of recurrent cystitis dating back years ago. She will be seeing her neurologist tomorrow for follow-up regarding her Parkinson's disorder, she plans on asking about the incontinence at that visit. She does have mild dysuria at times, otherwise denies hematuria, fevers, abdominal pain, pelvic pressure.  She would like to ensure that her last UTI has resolved.  Review of Systems  Constitutional: Negative for fever.  Gastrointestinal: Negative for abdominal pain.  Genitourinary: Negative for dysuria, flank pain, pelvic pain and vaginal discharge.       Urinary incontinence       Past Medical History:  Diagnosis Date  . Anemia 2018  . Anxiety   . Arthritis   . Borderline diabetes   . Chest pain    a. 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 Chicken pox    age 44  . Chronic back pain     Compressed discs. [place]  . Depression   . Dyspnea on exertion    a. 03/2009 Echo: EF 65%, no rwma, triv TR.  Marland Kitchen GERD (gastroesophageal reflux disease)   . High cholesterol   . Hypertension   . Hypokalemia   . Migraine headache   . Obesity   . Parkinson's disease ([place])   . Pre-diabetes    okay now      Social History   Socioeconomic History  . Marital status: Married    Spouse name: Not on file  . Number of children: Not on file  . Years of education: Not on file  . Highest education level: Not on file  Occupational History  . Occupation: Product manager: [employer]  Tobacco Use  . Smoking status: Never Smoker  . Smokeless tobacco: Never Used  Vaping Use  . Vaping Use: Never used  Substance and Sexual Activity  . Alcohol use: No  . Drug use: No  . Sexual activity: Yes    Partners: Male    Birth control/protection: Post-menopausal  Other Topics Concern  . Not on file  Social History Narrative   Married.   One son is 29, lives in [place].   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   Enjoys substitute teaching, gardening, cooking.   Social Determinants of Health   Financial Resource Strain: Low Risk   . Difficulty of Paying Living Expenses: Not hard at all  Food Insecurity: No Food Insecurity  . Worried About [employer] of  Food in the Last Year: Never true  . Ran Out of Food in the Last Year: Never true  Transportation Needs: No Transportation Needs  . Lack of Transportation (Medical): No  . Lack of Transportation (Non-Medical): No  Physical Activity: Inactive  . Days of Exercise per Week: 0 days  . Minutes of Exercise per Session: 0 min  Stress: Stress Concern Present  . Feeling of Stress : To some extent  Social Connections:   . Frequency of Communication with Friends and Family: Not on file  . Frequency of Social Gatherings with Friends and Family: Not on file  . Attends Religious Services: Not on file  . Active Member of  Clubs or Organizations: Not on file  . Attends Archivist Meetings: Not on file  . Marital Status: Not on file  Intimate Partner Violence: Not At Risk  . Fear of Current or Ex-Partner: No  . Emotionally Abused: No  . Physically Abused: No  . Sexually Abused: No    Past Surgical History:  Procedure Laterality Date  . BLADDER SUSPENSION  2005  . CARDIAC CATHETERIZATION    . CESAREAN SECTION    . CLOSED REDUCTION NASAL FRACTURE N/A 01/11/2020   Procedure: CLOSED REDUCTION NASAL FRACTURE;  Surgeon: Gretchen Gust, MD;  Location: [facility];  Service: ENT;  Laterality: N/A;  . ENDOMETRIAL ABLATION  2007  . FOOT SURGERY     bilateral bunions  . LUMBAR LAMINECTOMY/DECOMPRESSION MICRODISCECTOMY Bilateral 01/12/2016   Procedure: Bilateral L4-5 Laminotomy/Foraminotomy;  Surgeon: Newman Pies, MD;  Location: [facility];  Service: Neurosurgery;  Laterality: Bilateral;  Bilateral L4-5 Laminotomy/Foraminotomy  . TONSILLECTOMY  1971    Family History  Problem Relation Age of Onset  . Heart disease Mother   . Hypertension Mother   . Heart failure Mother        CHF  . Diabetes Mother   . Cancer Mother        CERVICAL CANCER  . Heart disease Father   . Diabetes Father   . Liver disease Father   . Hypertension Father   . Diabetes Sister   . Breast cancer Sister 9  . Heart disease Brother   . Heart failure Brother        CHF  . Diabetes Brother   . Cancer Brother 63       THROAT AND NECK  . Esophageal cancer Brother   . Cancer Maternal Grandfather   . Colon cancer Neg Hx   . Stomach cancer Neg Hx   . Rectal cancer Neg Hx     Allergies  Allergen Reactions  . Dilaudid [Hydromorphone]     hallucinations  . Codeine Anxiety  . Darvon Anxiety  . Hydrocodone Anxiety  . Meperidine Anxiety  . Oxycodone Anxiety  . Tramadol Anxiety  . Victoza [Liraglutide] Nausea Only and Other (See Comments)    And weakness    Current Outpatient Medications on File Prior  to Visit  Medication Sig Dispense Refill  . acetaminophen (TYLENOL) 325 MG tablet Take 650 mg by mouth every 6 (six) hours as needed.    Marland Kitchen buPROPion (WELLBUTRIN XL) 150 MG 24 hr tablet Take 150 mg by mouth at bedtime.    . carvedilol (COREG) 6.25 MG tablet TAKE 1 TABLET (6.25 MG TOTAL) BY MOUTH 2 (TWO) TIMES DAILY. PLEASE CALL OFFICE TO SCHEDULE APPOINTMENT FOR FURTHER REFILLS. 30 tablet 0  . cetirizine (ZYRTEC) 10 MG tablet Take 10 mg by mouth daily as needed for allergies.    Marland Kitchen  diazepam (VALIUM) 10 MG tablet Take 10 mg by mouth at bedtime.  2  . DULoxetine (CYMBALTA) 30 MG capsule Take 30 mg by mouth daily. (Total dose = 150 mg)    . DULoxetine (CYMBALTA) 60 MG capsule Take 120 mg by mouth every morning. (Total dose = 150 mg)    . gabapentin (NEURONTIN) 300 MG capsule Take 1 capsule (300 mg total) by mouth at bedtime. For back pain. 90 capsule 1  . Melatonin 12 MG TABS Take by mouth at bedtime as needed.    . Multiple Vitamin (MULTIVITAMIN) tablet Take 1 tablet by mouth daily.    . ondansetron (ZOFRAN) 8 MG tablet Take 8 mg by mouth every 8 (eight) hours as needed.    Marland Kitchen OVER THE COUNTER MEDICATION Ol[employer] Beauty vitamin    . pantoprazole (PROTONIX) 40 MG tablet TAKE 1 TABLET BY MOUTH EVERY DAY 90 tablet 1  . QUEtiapine (SEROQUEL) 50 MG tablet Take 50 mg by mouth in the morning and at bedtime.    . simvastatin (ZOCOR) 20 MG tablet Take 1 tablet (20 mg total) by mouth at bedtime. 90 tablet 3  . spironolactone (ALDACTONE) 25 MG tablet TAKE 1 TABLET BY MOUTH TWICE A DAY 60 tablet 0  . SUMAtriptan (IMITREX) 100 MG tablet Take by mouth.    . topiramate (TOPAMAX) 50 MG tablet Take 50 mg by mouth daily.     . traMADol (ULTRAM) 50 MG tablet Take 1 tablet (50 mg total) by mouth every 6 (six) hours as needed. 30 tablet 0  . NUPLAZID 34 MG CAPS Take 1 capsule by mouth daily.     No current facility-administered medications on file prior to visit.    BP 118/74   Pulse 100   Temp (!) 96.6 F (35.9 C)  (Temporal)   Ht 5\' 1"  (1.549 m)   Wt 179 lb (81.2 kg)   SpO2 97%   BMI 33.82 kg/m    Objective:   Physical Exam Cardiovascular:     Rate and Rhythm: Normal rate and regular rhythm.  Pulmonary:     Effort: Pulmonary effort is normal.     Breath sounds: Normal breath sounds.  Neurological:     Mental Status: She is alert.  Psychiatric:        Mood and Affect: Mood normal.            Assessment & Plan:

## 2020-04-16 ENCOUNTER — Other Ambulatory Visit (INDEPENDENT_AMBULATORY_CARE_PROVIDER_SITE_OTHER): Payer: Medicare Other

## 2020-04-16 DIAGNOSIS — I10 Essential (primary) hypertension: Secondary | ICD-10-CM

## 2020-04-16 LAB — WHITE CELL DIFFERENTIAL
Band Neutrophils: 0 %
Basophils Relative: 1 % (ref 0.0–3.0)
Eosinophils Relative: 1.1 % (ref 0.0–5.0)
Lymphocytes Relative: 6.6 % — ABNORMAL LOW (ref 12.0–46.0)
Monocytes Relative: 8.3 % (ref 3.0–12.0)
Neutrophils Relative %: 83.2 % — ABNORMAL HIGH (ref 43.0–77.0)

## 2020-04-16 LAB — URINE CULTURE
MICRO NUMBER:: 11004688
SPECIMEN QUALITY:: ADEQUATE

## 2020-04-16 NOTE — Telephone Encounter (Signed)
Called and l/m to call office at both numbers in chart.

## 2020-04-16 NOTE — Telephone Encounter (Signed)
Sent request to lab to add differential to existing CBC

## 2020-04-18 ENCOUNTER — Other Ambulatory Visit: Payer: Self-pay | Admitting: Cardiovascular Disease

## 2020-04-23 NOTE — Telephone Encounter (Signed)
Yes, thank you.

## 2020-04-23 NOTE — Telephone Encounter (Signed)
Still have not been able to reach patient do you want me to send letter with lab notes?

## 2020-04-26 DIAGNOSIS — M5416 Radiculopathy, lumbar region: Secondary | ICD-10-CM | POA: Diagnosis not present

## 2020-04-26 DIAGNOSIS — F329 Major depressive disorder, single episode, unspecified: Secondary | ICD-10-CM | POA: Diagnosis not present

## 2020-04-26 DIAGNOSIS — Z9089 Acquired absence of other organs: Secondary | ICD-10-CM | POA: Diagnosis not present

## 2020-04-26 DIAGNOSIS — K219 Gastro-esophageal reflux disease without esophagitis: Secondary | ICD-10-CM | POA: Diagnosis not present

## 2020-04-26 DIAGNOSIS — I4891 Unspecified atrial fibrillation: Secondary | ICD-10-CM | POA: Diagnosis not present

## 2020-04-26 DIAGNOSIS — Z9181 History of falling: Secondary | ICD-10-CM | POA: Diagnosis not present

## 2020-04-26 DIAGNOSIS — I10 Essential (primary) hypertension: Secondary | ICD-10-CM | POA: Diagnosis not present

## 2020-04-26 DIAGNOSIS — F060 Psychotic disorder with hallucinations due to known physiological condition: Secondary | ICD-10-CM | POA: Diagnosis not present

## 2020-04-26 DIAGNOSIS — G3183 Dementia with Lewy bodies: Secondary | ICD-10-CM | POA: Diagnosis not present

## 2020-04-26 DIAGNOSIS — F062 Psychotic disorder with delusions due to known physiological condition: Secondary | ICD-10-CM | POA: Diagnosis not present

## 2020-04-26 DIAGNOSIS — F419 Anxiety disorder, unspecified: Secondary | ICD-10-CM | POA: Diagnosis not present

## 2020-04-26 DIAGNOSIS — F0281 Dementia in other diseases classified elsewhere with behavioral disturbance: Secondary | ICD-10-CM | POA: Diagnosis not present

## 2020-04-26 DIAGNOSIS — G43909 Migraine, unspecified, not intractable, without status migrainosus: Secondary | ICD-10-CM | POA: Diagnosis not present

## 2020-04-26 DIAGNOSIS — R45851 Suicidal ideations: Secondary | ICD-10-CM | POA: Diagnosis not present

## 2020-04-26 DIAGNOSIS — F909 Attention-deficit hyperactivity disorder, unspecified type: Secondary | ICD-10-CM | POA: Diagnosis not present

## 2020-04-26 DIAGNOSIS — R42 Dizziness and giddiness: Secondary | ICD-10-CM | POA: Diagnosis not present

## 2020-04-27 ENCOUNTER — Other Ambulatory Visit: Payer: Self-pay | Admitting: Cardiovascular Disease

## 2020-04-28 ENCOUNTER — Encounter: Payer: Self-pay | Admitting: Primary Care

## 2020-04-28 DIAGNOSIS — N3000 Acute cystitis without hematuria: Secondary | ICD-10-CM

## 2020-04-28 NOTE — Telephone Encounter (Signed)
Letter and copy on results mailed to home address.

## 2020-04-28 NOTE — Telephone Encounter (Signed)
Katherine [name], can you update the phone number?  This is her husband's number and the most reliable to call.

## 2020-04-29 NOTE — Telephone Encounter (Signed)
Yes, please.

## 2020-04-30 ENCOUNTER — Other Ambulatory Visit: Payer: Medicare Other

## 2020-04-30 DIAGNOSIS — F0281 Dementia in other diseases classified elsewhere with behavioral disturbance: Secondary | ICD-10-CM | POA: Diagnosis not present

## 2020-04-30 DIAGNOSIS — F060 Psychotic disorder with hallucinations due to known physiological condition: Secondary | ICD-10-CM | POA: Diagnosis not present

## 2020-04-30 DIAGNOSIS — G3183 Dementia with Lewy bodies: Secondary | ICD-10-CM | POA: Diagnosis not present

## 2020-04-30 DIAGNOSIS — F909 Attention-deficit hyperactivity disorder, unspecified type: Secondary | ICD-10-CM | POA: Diagnosis not present

## 2020-04-30 DIAGNOSIS — F062 Psychotic disorder with delusions due to known physiological condition: Secondary | ICD-10-CM | POA: Diagnosis not present

## 2020-04-30 DIAGNOSIS — M5416 Radiculopathy, lumbar region: Secondary | ICD-10-CM | POA: Diagnosis not present

## 2020-04-30 NOTE — Telephone Encounter (Signed)
Called patient has app for Friday for labs.

## 2020-05-01 DIAGNOSIS — M5416 Radiculopathy, lumbar region: Secondary | ICD-10-CM | POA: Diagnosis not present

## 2020-05-01 DIAGNOSIS — F060 Psychotic disorder with hallucinations due to known physiological condition: Secondary | ICD-10-CM | POA: Diagnosis not present

## 2020-05-01 DIAGNOSIS — F0281 Dementia in other diseases classified elsewhere with behavioral disturbance: Secondary | ICD-10-CM | POA: Diagnosis not present

## 2020-05-01 DIAGNOSIS — F909 Attention-deficit hyperactivity disorder, unspecified type: Secondary | ICD-10-CM | POA: Diagnosis not present

## 2020-05-01 DIAGNOSIS — G3183 Dementia with Lewy bodies: Secondary | ICD-10-CM | POA: Diagnosis not present

## 2020-05-01 DIAGNOSIS — F062 Psychotic disorder with delusions due to known physiological condition: Secondary | ICD-10-CM | POA: Diagnosis not present

## 2020-05-02 ENCOUNTER — Other Ambulatory Visit (INDEPENDENT_AMBULATORY_CARE_PROVIDER_SITE_OTHER): Payer: Medicare Other

## 2020-05-02 ENCOUNTER — Other Ambulatory Visit: Payer: Self-pay

## 2020-05-02 DIAGNOSIS — N3000 Acute cystitis without hematuria: Secondary | ICD-10-CM | POA: Diagnosis not present

## 2020-05-02 LAB — CBC WITH DIFFERENTIAL/PLATELET
Basophils Absolute: 0.2 10*3/uL — ABNORMAL HIGH (ref 0.0–0.1)
Basophils Relative: 1.6 % (ref 0.0–3.0)
Eosinophils Absolute: 0.4 10*3/uL (ref 0.0–0.7)
Eosinophils Relative: 3.6 % (ref 0.0–5.0)
HCT: 32.2 % — ABNORMAL LOW (ref 36.0–46.0)
Hemoglobin: 9.7 g/dL — ABNORMAL LOW (ref 12.0–15.0)
Lymphocytes Relative: 20.1 % (ref 12.0–46.0)
Lymphs Abs: 2 10*3/uL (ref 0.7–4.0)
MCHC: 30.1 g/dL (ref 30.0–36.0)
MCV: 74.5 fl — ABNORMAL LOW (ref 78.0–100.0)
Monocytes Absolute: 0.7 10*3/uL (ref 0.1–1.0)
Monocytes Relative: 7.2 % (ref 3.0–12.0)
Neutro Abs: 6.7 10*3/uL (ref 1.4–7.7)
Neutrophils Relative %: 67.5 % (ref 43.0–77.0)
Platelets: 380 10*3/uL (ref 150.0–400.0)
RBC: 4.33 Mil/uL (ref 3.87–5.11)
RDW: 18.8 % — ABNORMAL HIGH (ref 11.5–15.5)
WBC: 9.9 10*3/uL (ref 4.0–10.5)

## 2020-05-02 LAB — POC URINALSYSI DIPSTICK (AUTOMATED)
Bilirubin, UA: NEGATIVE
Blood, UA: NEGATIVE
Glucose, UA: NEGATIVE
Ketones, UA: NEGATIVE
Leukocytes, UA: NEGATIVE
Nitrite, UA: NEGATIVE
Protein, UA: NEGATIVE
Spec Grav, UA: 1.01 (ref 1.010–1.025)
Urobilinogen, UA: 0.2 E.U./dL
pH, UA: 6 (ref 5.0–8.0)

## 2020-05-03 LAB — URINE CULTURE
MICRO NUMBER:: 11077619
SPECIMEN QUALITY:: ADEQUATE

## 2020-05-04 ENCOUNTER — Other Ambulatory Visit: Payer: Self-pay | Admitting: Cardiovascular Disease

## 2020-05-05 ENCOUNTER — Telehealth: Payer: Self-pay | Admitting: Internal Medicine

## 2020-05-05 ENCOUNTER — Telehealth: Payer: Self-pay | Admitting: Primary Care

## 2020-05-05 DIAGNOSIS — F060 Psychotic disorder with hallucinations due to known physiological condition: Secondary | ICD-10-CM | POA: Diagnosis not present

## 2020-05-05 DIAGNOSIS — F0281 Dementia in other diseases classified elsewhere with behavioral disturbance: Secondary | ICD-10-CM | POA: Diagnosis not present

## 2020-05-05 DIAGNOSIS — F909 Attention-deficit hyperactivity disorder, unspecified type: Secondary | ICD-10-CM | POA: Diagnosis not present

## 2020-05-05 DIAGNOSIS — G3183 Dementia with Lewy bodies: Secondary | ICD-10-CM | POA: Diagnosis not present

## 2020-05-05 DIAGNOSIS — M5416 Radiculopathy, lumbar region: Secondary | ICD-10-CM | POA: Diagnosis not present

## 2020-05-05 DIAGNOSIS — F062 Psychotic disorder with delusions due to known physiological condition: Secondary | ICD-10-CM | POA: Diagnosis not present

## 2020-05-05 NOTE — Telephone Encounter (Signed)
Pts Hgb was low at her pcp office, 9.7. States over a year ago it was low and she was given IV iron. Pt states the PA at her PCP office instructed her to contact our office to see what Dr. Henrene Pastor wanted to do. Please advise.

## 2020-05-05 NOTE — Telephone Encounter (Signed)
Pt was informed by her PCP that her iron levels were low and to please advise Dr Henrene Pastor on what she should do.

## 2020-05-05 NOTE — Telephone Encounter (Signed)
I have never done this for before, but will try my best. Order sheet obtained from Valinda Hoar who has a Restaurant manager, fast food copy of the order. I completed it to the best of my knowledge and placed it in Katherine [name]'s in box. Katherine [name], would like to get this scheduled ASAP.  I believe they are infused at short stay at [facility].

## 2020-05-05 NOTE — Telephone Encounter (Signed)
Spoke with pt and she is aware. Pt knows to contact PCP office and to call us back after she has the Iron to schedule an OV.

## 2020-05-05 NOTE — Telephone Encounter (Signed)
Patient called. Patient contacted Dr.Perry's office about an iron infusion.  Dr.Perry's office called patient and said that Anda Kraft can order infusion (see last phone note from Dr.Perry).

## 2020-05-05 NOTE — Telephone Encounter (Signed)
Ok to order infusion for patient?

## 2020-05-05 NOTE — Telephone Encounter (Signed)
Looks like she needs IV iron again. Her PCP should arrange this ASAP Routine GI office follow up thereafter. Thanks

## 2020-05-06 ENCOUNTER — Other Ambulatory Visit: Payer: Self-pay | Admitting: Cardiovascular Disease

## 2020-05-07 ENCOUNTER — Other Ambulatory Visit: Payer: Self-pay

## 2020-05-07 DIAGNOSIS — D649 Anemia, unspecified: Secondary | ICD-10-CM

## 2020-05-07 NOTE — Telephone Encounter (Signed)
FYI I have made appointment for Infusion for 05/16/2020 at 8am. I have called and let husband know app date and time. Order has been placed in epic and information faxed to [phone].

## 2020-05-07 NOTE — Telephone Encounter (Signed)
Thank you :)

## 2020-05-09 DIAGNOSIS — F060 Psychotic disorder with hallucinations due to known physiological condition: Secondary | ICD-10-CM | POA: Diagnosis not present

## 2020-05-09 DIAGNOSIS — F062 Psychotic disorder with delusions due to known physiological condition: Secondary | ICD-10-CM | POA: Diagnosis not present

## 2020-05-09 DIAGNOSIS — F0281 Dementia in other diseases classified elsewhere with behavioral disturbance: Secondary | ICD-10-CM | POA: Diagnosis not present

## 2020-05-09 DIAGNOSIS — F909 Attention-deficit hyperactivity disorder, unspecified type: Secondary | ICD-10-CM | POA: Diagnosis not present

## 2020-05-09 DIAGNOSIS — M5416 Radiculopathy, lumbar region: Secondary | ICD-10-CM | POA: Diagnosis not present

## 2020-05-09 DIAGNOSIS — G3183 Dementia with Lewy bodies: Secondary | ICD-10-CM | POA: Diagnosis not present

## 2020-05-09 NOTE — Progress Notes (Deleted)
Virtual Visit via Telephone Note   This visit type was conducted due to national recommendations for restrictions regarding the COVID-19 Pandemic (e.g. social distancing) in an effort to limit this patient's exposure and mitigate transmission in our community.  Due to her co-morbid illnesses, this patient is at least at moderate risk for complications without adequate follow up.  This format is felt to be most appropriate for this patient at this time.  The patient did not have access to video technology/had technical difficulties with video requiring transitioning to audio format only (telephone).  All issues noted in this document were discussed and addressed.  No physical exam could be performed with this format.  Please refer to the patient's chart for her  consent to telehealth for [facility].   I connected with  Katherine [name] on 05/09/20 by a video enabled telemedicine application and verified that I am speaking with the correct person using two identifiers. I discussed the limitations of evaluation and management by telemedicine. The patient expressed understanding and agreed to proceed.   Evaluation Performed:  Follow-up visit  Date:  05/09/2020   ID:  Katherine [name], Katherine [name] [birth year], MRN [id number]  Patient Location:  [place] [address]   Provider location:   [facility], [place] office  PCP:  [name], NP  Cardiologist:  Patsy Baltimore   Chief Complaint:  Dizzy, falls    History of Present Illness:    Katherine [name] is a 69 y.o. female who presents via audio/video conferencing for a telehealth visit today.   The patient does not symptoms concerning for COVID-19 infection (fever, chills, cough, or new SHORTNESS OF BREATH).   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  Back pain Previously started on spironolactone for low potassium   March 2019  echoand stress test reviewed with her in detail showed EF 65% no signifcant valvular disease.  No ischemia on stress   back surgery with Dr. Arnoldo Morale, disc bulge 2    Prior CV studies:   The following studies were reviewed today:    Past Medical History:  Diagnosis Date  . Anemia 2018  . Anxiety   . Arthritis   . Borderline diabetes   . Chest pain    a. 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 Chicken pox    age 54  . Chronic back pain    Compressed discs. [place]  . Depression   . Dyspnea on exertion    a. 03/2009 Echo: EF 65%, no rwma, triv TR.  Marland Kitchen GERD (gastroesophageal reflux disease)   . High cholesterol   . Hypertension   . Hypokalemia   . Migraine headache   . Obesity   .  Parkinson's disease ([place])   . Pre-diabetes    okay now    Past Surgical History:  Procedure Laterality Date  . BLADDER SUSPENSION  2005  . CARDIAC CATHETERIZATION    . CESAREAN SECTION    . CLOSED REDUCTION NASAL FRACTURE N/A 01/11/2020   Procedure: CLOSED REDUCTION NASAL FRACTURE;  Surgeon: Saima Gust, MD;  Location: [place];  Service: ENT;  Laterality: N/A;  . ENDOMETRIAL ABLATION  2007  . FOOT SURGERY     bilateral bunions  . LUMBAR LAMINECTOMY/DECOMPRESSION MICRODISCECTOMY Bilateral 01/12/2016    Procedure: Bilateral L4-5 Laminotomy/Foraminotomy;  Surgeon: Newman Pies, MD;  Location: [place] [facility];  Service: Neurosurgery;  Laterality: Bilateral;  Bilateral L4-5 Laminotomy/Foraminotomy  . TONSILLECTOMY  1971      Allergies:   Dilaudid [hydromorphone], Codeine, Darvon, Hydrocodone, Meperidine, Oxycodone, Tramadol, and Victoza [liraglutide]   Social History   Tobacco Use  . Smoking status: Never Smoker  . Smokeless tobacco: Never Used  Vaping Use  . Vaping Use: Never used  Substance Use Topics  . Alcohol use: No  . Drug use: No     Current Outpatient Medications on File Prior to Visit  Medication Sig Dispense Refill  . acetaminophen (TYLENOL) 325 MG tablet Take 650 mg by mouth every 6 (six) hours as needed.    Marland Kitchen buPROPion (WELLBUTRIN XL) 150 MG 24 hr tablet Take 150 mg by mouth at bedtime.    . carvedilol (COREG) 6.25 MG tablet TAKE 1 TABLET (6.25 MG TOTAL) BY MOUTH 2 (TWO) TIMES DAILY WITH A MEAL. 30 tablet 0  . cetirizine (ZYRTEC) 10 MG tablet Take 10 mg by mouth daily as needed for allergies.    . diazepam (VALIUM) 10 MG tablet Take 10 mg by mouth at bedtime.  2  . DULoxetine (CYMBALTA) 30 MG capsule Take 30 mg by mouth daily. (Total dose = 150 mg)    . DULoxetine (CYMBALTA) 60 MG capsule Take 120 mg by mouth every morning. (Total dose = 150 mg)    . gabapentin (NEURONTIN) 300 MG capsule Take 1 capsule (300 mg total) by mouth at bedtime. For back pain. 90 capsule 1  . Melatonin 12 MG TABS Take by mouth at bedtime as needed.    . Multiple Vitamin (MULTIVITAMIN) tablet Take 1 tablet by mouth daily.    . NUPLAZID 34 MG CAPS Take 1 capsule by mouth daily.    . ondansetron (ZOFRAN) 8 MG tablet Take 8 mg by mouth every 8 (eight) hours as needed.    Marland Kitchen OVER THE COUNTER MEDICATION Olly Beauty vitamin    . pantoprazole (PROTONIX) 40 MG tablet TAKE 1 TABLET BY MOUTH EVERY DAY 90 tablet 1  . QUEtiapine (SEROQUEL) 50 MG tablet Take 50 mg by mouth in the morning and at bedtime.    .  simvastatin (ZOCOR) 20 MG tablet Take 1 tablet (20 mg total) by mouth at bedtime. 90 tablet 3  . spironolactone (ALDACTONE) 25 MG tablet TAKE 1 TABLET BY MOUTH TWICE A DAY 60 tablet 0  . sulfamethoxazole-trimethoprim (BACTRIM DS) 800-160 MG tablet Take 1 tablet by mouth 2 (two) times daily. For urinary tract infection. 10 tablet 0  . SUMAtriptan (IMITREX) 100 MG tablet Take by mouth.    . topiramate (TOPAMAX) 50 MG tablet Take 50 mg by mouth daily.     . traMADol (ULTRAM) 50 MG tablet Take 1 tablet (50 mg total) by mouth every 6 (six) hours as needed. 30 tablet 0   No current facility-administered medications  on file prior to visit.     Family Hx: The patient's family history includes Breast cancer (age of onset: 33) in her sister; Cancer in her maternal grandfather and mother; Cancer (age of onset: 34) in her brother; Diabetes in her brother, father, mother, and sister; Esophageal cancer in her brother; Heart disease in her brother, father, and mother; Heart failure in her brother and mother; Hypertension in her father and mother; Liver disease in her father. There is no history of Colon cancer, Stomach cancer, or Rectal cancer.  ROS:   Please see the history of present illness.    Review of Systems  Constitutional: Negative.   HENT: Negative.   Respiratory: Negative.   Cardiovascular: Negative.   Gastrointestinal: Negative.   Musculoskeletal: Negative.   Neurological: Positive for dizziness.  Psychiatric/Behavioral: Negative.   All other systems reviewed and are negative.     Labs/Other Tests and Data Reviewed:    Recent Labs: 04/15/2020: ALT 6; BUN 19; Creatinine, Ser 0.80; Potassium 4.4; Sodium 135 05/02/2020: Hemoglobin 9.7 Repeated and verified X2.; Platelets 380.0   Recent Lipid Panel Lab Results  Component Value Date/Time   CHOL 130 04/15/2020 12:41 PM   CHOL 123 06/22/2013 08:09 AM   TRIG 85.0 04/15/2020 12:41 PM   HDL 52.20 04/15/2020 12:41 PM   HDL 48 06/22/2013  08:09 AM   CHOLHDL 2 04/15/2020 12:41 PM   LDLCALC 60 04/15/2020 12:41 PM   LDLCALC 60 06/22/2013 08:09 AM   LDLDIRECT 92.0 05/10/2018 11:10 AM    Wt Readings from Last 3 Encounters:  04/15/20 179 lb (81.2 kg)  02/15/20 187 lb 8 oz (85 kg)  02/13/20 185 lb 8 oz (84.1 kg)     Exam:    Vital Signs: Vital signs may also be detailed in the HPI There were no vitals taken for this visit.  Wt Readings from Last 3 Encounters:  04/15/20 179 lb (81.2 kg)  02/15/20 187 lb 8 oz (85 kg)  02/13/20 185 lb 8 oz (84.1 kg)   Temp Readings from Last 3 Encounters:  04/15/20 (!) 96.6 F (35.9 C) (Temporal)  02/15/20 97.9 F (36.6 C) (Temporal)  02/13/20 (!) 95.2 F (35.1 C) (Temporal)   BP Readings from Last 3 Encounters:  04/15/20 118/74  02/15/20 110/70  02/13/20 128/80   Pulse Readings from Last 3 Encounters:  04/15/20 100  02/15/20 84  02/13/20 100     Well nourished, well developed female in no acute distress. Constitutional:  oriented to person, place, and time. No distress.  Head: Normocephalic and atraumatic.  Eyes:  no discharge. No scleral icterus.  Neck: Normal range of motion. Neck supple.  Pulmonary/Chest: No audible wheezing, no distress, appears comfortable Musculoskeletal: Normal range of motion.  no  tenderness or deformity.  Neurological:   Coordination normal. Full exam not performed Skin:  No rash Psychiatric:  normal mood and affect. behavior is normal. Thought content normal.    ASSESSMENT & PLAN:    Problem List Items Addressed This Visit    None     Dizziness Etiology unclear, concerned about low blood pressure and orthostasis We will decrease the coreg down to 6.25 BID Monitor blood pressure and call in 2 to 3 weeks  Falls Unsteady gait, Suggested a cane Has an MRI today, head  Tachycardia We will decrease the carvedilol, she will call us if tachycardia symptoms recur Previously have been well controlled on carvedilol 12.5 but I am concerned  about low blood pressure   COVID-19  Education: The signs and symptoms of COVID-19 were discussed with the patient and how to seek care for testing (follow up with PCP or arrange E-visit).  The importance of social distancing was discussed today.  Patient Risk:   After full review of this patients clinical status, I feel that they are at least moderate risk at this time.  Time:   Today, I have spent 25 minutes with the patient with telehealth technology discussing the cardiac and medical problems/diagnoses detailed above   Additional 10 min spent reviewing the chart prior to patient visit today   Medication Adjustments/Labs and Tests Ordered: Current medicines are reviewed at length with the patient today.  Concerns regarding medicines are outlined above.   Tests Ordered: No tests ordered   Medication Changes: No changes made   Disposition: Follow-up in 6 months   Signed, Ida Rogue, MD  [facility] Office [address], [place], [place] [address]

## 2020-05-12 ENCOUNTER — Ambulatory Visit: Payer: Medicare Other | Admitting: Cardiovascular Disease

## 2020-05-12 DIAGNOSIS — R7303 Prediabetes: Secondary | ICD-10-CM

## 2020-05-12 DIAGNOSIS — F909 Attention-deficit hyperactivity disorder, unspecified type: Secondary | ICD-10-CM | POA: Diagnosis not present

## 2020-05-12 DIAGNOSIS — G3183 Dementia with Lewy bodies: Secondary | ICD-10-CM | POA: Diagnosis not present

## 2020-05-12 DIAGNOSIS — E782 Mixed hyperlipidemia: Secondary | ICD-10-CM

## 2020-05-12 DIAGNOSIS — F060 Psychotic disorder with hallucinations due to known physiological condition: Secondary | ICD-10-CM | POA: Diagnosis not present

## 2020-05-12 DIAGNOSIS — M5416 Radiculopathy, lumbar region: Secondary | ICD-10-CM | POA: Diagnosis not present

## 2020-05-12 DIAGNOSIS — I10 Essential (primary) hypertension: Secondary | ICD-10-CM

## 2020-05-12 DIAGNOSIS — F062 Psychotic disorder with delusions due to known physiological condition: Secondary | ICD-10-CM | POA: Diagnosis not present

## 2020-05-12 DIAGNOSIS — G4733 Obstructive sleep apnea (adult) (pediatric): Secondary | ICD-10-CM

## 2020-05-12 DIAGNOSIS — R079 Chest pain, unspecified: Secondary | ICD-10-CM

## 2020-05-12 DIAGNOSIS — I479 Paroxysmal tachycardia, unspecified: Secondary | ICD-10-CM

## 2020-05-12 DIAGNOSIS — F0281 Dementia in other diseases classified elsewhere with behavioral disturbance: Secondary | ICD-10-CM | POA: Diagnosis not present

## 2020-05-13 ENCOUNTER — Ambulatory Visit: Payer: Medicare Other | Admitting: Primary Care

## 2020-05-13 DIAGNOSIS — F062 Psychotic disorder with delusions due to known physiological condition: Secondary | ICD-10-CM | POA: Diagnosis not present

## 2020-05-13 DIAGNOSIS — R42 Dizziness and giddiness: Secondary | ICD-10-CM | POA: Diagnosis not present

## 2020-05-13 DIAGNOSIS — M5416 Radiculopathy, lumbar region: Secondary | ICD-10-CM | POA: Diagnosis not present

## 2020-05-13 DIAGNOSIS — F060 Psychotic disorder with hallucinations due to known physiological condition: Secondary | ICD-10-CM | POA: Diagnosis not present

## 2020-05-13 DIAGNOSIS — F909 Attention-deficit hyperactivity disorder, unspecified type: Secondary | ICD-10-CM | POA: Diagnosis not present

## 2020-05-13 DIAGNOSIS — F0281 Dementia in other diseases classified elsewhere with behavioral disturbance: Secondary | ICD-10-CM | POA: Diagnosis not present

## 2020-05-13 DIAGNOSIS — G3183 Dementia with Lewy bodies: Secondary | ICD-10-CM | POA: Diagnosis not present

## 2020-05-14 ENCOUNTER — Other Ambulatory Visit: Payer: Self-pay | Admitting: Primary Care

## 2020-05-15 ENCOUNTER — Other Ambulatory Visit: Payer: Self-pay | Admitting: Primary Care

## 2020-05-15 DIAGNOSIS — F909 Attention-deficit hyperactivity disorder, unspecified type: Secondary | ICD-10-CM | POA: Diagnosis not present

## 2020-05-15 DIAGNOSIS — F062 Psychotic disorder with delusions due to known physiological condition: Secondary | ICD-10-CM | POA: Diagnosis not present

## 2020-05-15 DIAGNOSIS — M5416 Radiculopathy, lumbar region: Secondary | ICD-10-CM | POA: Diagnosis not present

## 2020-05-15 DIAGNOSIS — G3183 Dementia with Lewy bodies: Secondary | ICD-10-CM | POA: Diagnosis not present

## 2020-05-15 DIAGNOSIS — F060 Psychotic disorder with hallucinations due to known physiological condition: Secondary | ICD-10-CM | POA: Diagnosis not present

## 2020-05-15 DIAGNOSIS — F0281 Dementia in other diseases classified elsewhere with behavioral disturbance: Secondary | ICD-10-CM | POA: Diagnosis not present

## 2020-05-16 ENCOUNTER — Ambulatory Visit (HOSPITAL_COMMUNITY)
Admission: RE | Admit: 2020-05-16 | Discharge: 2020-05-16 | Disposition: A | Discharge status: Home / Self Care | Payer: Medicare Other | Source: Ambulatory Visit | Attending: Internal Medicine | Admitting: Internal Medicine

## 2020-05-16 ENCOUNTER — Other Ambulatory Visit: Payer: Self-pay

## 2020-05-16 DIAGNOSIS — D649 Anemia, unspecified: Secondary | ICD-10-CM | POA: Diagnosis not present

## 2020-05-16 MED ORDER — SODIUM CHLORIDE 0.9 % IV SOLN
INTRAVENOUS | Status: DC | PRN
  Administered 2020-05-16: 250 mL via INTRAVENOUS

## 2020-05-16 MED ORDER — SODIUM CHLORIDE 0.9 % IV SOLN
510.0000 mg | INTRAVENOUS | Status: DC
  Administered 2020-05-16: 510 mg via INTRAVENOUS
  Filled 2020-05-16: qty 510

## 2020-05-16 NOTE — Discharge Instructions (Signed)

## 2020-05-16 NOTE — Progress Notes (Signed)
PATIENT CARE CENTER NOTE  Diagnosis: Anemia, unspecified type (D64.9)   Provider: Alma Friendly, NP   Procedure: Feraheme infusion   Note: Patient received Feraheme infusion via PIV. Tolerated well with no adverse reaction. Observed patient for 30 minutes post-infusion. Vital signs stable. Patient to come back next week for second infusion. Discharge instructions given. Patient alert, oriented and ambulatory at discharge.

## 2020-05-19 ENCOUNTER — Other Ambulatory Visit: Payer: Self-pay

## 2020-05-19 ENCOUNTER — Other Ambulatory Visit (INDEPENDENT_AMBULATORY_CARE_PROVIDER_SITE_OTHER): Payer: Medicare Other

## 2020-05-19 ENCOUNTER — Other Ambulatory Visit: Payer: Medicare Other

## 2020-05-19 ENCOUNTER — Other Ambulatory Visit: Payer: Self-pay | Admitting: Primary Care

## 2020-05-19 DIAGNOSIS — M5416 Radiculopathy, lumbar region: Secondary | ICD-10-CM | POA: Diagnosis not present

## 2020-05-19 DIAGNOSIS — F909 Attention-deficit hyperactivity disorder, unspecified type: Secondary | ICD-10-CM | POA: Diagnosis not present

## 2020-05-19 DIAGNOSIS — D509 Iron deficiency anemia, unspecified: Secondary | ICD-10-CM | POA: Diagnosis not present

## 2020-05-19 DIAGNOSIS — F062 Psychotic disorder with delusions due to known physiological condition: Secondary | ICD-10-CM | POA: Diagnosis not present

## 2020-05-19 DIAGNOSIS — F060 Psychotic disorder with hallucinations due to known physiological condition: Secondary | ICD-10-CM | POA: Diagnosis not present

## 2020-05-19 DIAGNOSIS — F0281 Dementia in other diseases classified elsewhere with behavioral disturbance: Secondary | ICD-10-CM | POA: Diagnosis not present

## 2020-05-19 DIAGNOSIS — E538 Deficiency of other specified B group vitamins: Secondary | ICD-10-CM | POA: Diagnosis not present

## 2020-05-19 DIAGNOSIS — G3183 Dementia with Lewy bodies: Secondary | ICD-10-CM | POA: Diagnosis not present

## 2020-05-19 LAB — VITAMIN B12: Vitamin B-12: 322 pg/mL (ref 211–911)

## 2020-05-19 LAB — IBC + FERRITIN
Ferritin: 360 ng/mL — ABNORMAL HIGH (ref 10.0–291.0)
Iron: 275 ug/dL — ABNORMAL HIGH (ref 42–145)
Saturation Ratios: 69.4 % — ABNORMAL HIGH (ref 20.0–50.0)
Transferrin: 283 mg/dL (ref 212.0–360.0)

## 2020-05-19 LAB — CBC
HCT: 32.6 % — ABNORMAL LOW (ref 36.0–46.0)
Hemoglobin: 9.9 g/dL — ABNORMAL LOW (ref 12.0–15.0)
MCHC: 30.5 g/dL (ref 30.0–36.0)
MCV: 73.1 fl — ABNORMAL LOW (ref 78.0–100.0)
Platelets: 363 10*3/uL (ref 150.0–400.0)
RBC: 4.46 Mil/uL (ref 3.87–5.11)
RDW: 18.6 % — ABNORMAL HIGH (ref 11.5–15.5)
WBC: 10.8 10*3/uL — ABNORMAL HIGH (ref 4.0–10.5)

## 2020-05-19 NOTE — Telephone Encounter (Signed)
Pt came in office for labs reviewed with kate we are checking her B-12 and will get back with her once we get results. She denies any urinary symptoms so we will not check urine today. Pt has been informed of all information and has no further questions.

## 2020-05-20 ENCOUNTER — Other Ambulatory Visit (INDEPENDENT_AMBULATORY_CARE_PROVIDER_SITE_OTHER): Payer: Medicare Other

## 2020-05-20 DIAGNOSIS — D72819 Decreased white blood cell count, unspecified: Secondary | ICD-10-CM | POA: Diagnosis not present

## 2020-05-20 LAB — WHITE CELL DIFFERENTIAL
Band Neutrophils: 0 %
Basophils Relative: 2.9 % (ref 0.0–3.0)
Eosinophils Relative: 4.5 % (ref 0.0–5.0)
Lymphocytes Relative: 15.6 % (ref 12.0–46.0)
Monocytes Relative: 4.6 % (ref 3.0–12.0)
Neutrophils Relative %: 72.4 % (ref 43.0–77.0)

## 2020-05-22 ENCOUNTER — Other Ambulatory Visit: Payer: Self-pay | Admitting: Cardiovascular Disease

## 2020-05-22 DIAGNOSIS — M5416 Radiculopathy, lumbar region: Secondary | ICD-10-CM | POA: Diagnosis not present

## 2020-05-22 DIAGNOSIS — F909 Attention-deficit hyperactivity disorder, unspecified type: Secondary | ICD-10-CM | POA: Diagnosis not present

## 2020-05-22 DIAGNOSIS — F062 Psychotic disorder with delusions due to known physiological condition: Secondary | ICD-10-CM | POA: Diagnosis not present

## 2020-05-22 DIAGNOSIS — F060 Psychotic disorder with hallucinations due to known physiological condition: Secondary | ICD-10-CM | POA: Diagnosis not present

## 2020-05-22 DIAGNOSIS — G3183 Dementia with Lewy bodies: Secondary | ICD-10-CM | POA: Diagnosis not present

## 2020-05-22 DIAGNOSIS — F0281 Dementia in other diseases classified elsewhere with behavioral disturbance: Secondary | ICD-10-CM | POA: Diagnosis not present

## 2020-05-22 NOTE — Telephone Encounter (Signed)
Rx request sent to pharmacy.  

## 2020-05-26 DIAGNOSIS — R45851 Suicidal ideations: Secondary | ICD-10-CM | POA: Diagnosis not present

## 2020-05-26 DIAGNOSIS — M5416 Radiculopathy, lumbar region: Secondary | ICD-10-CM | POA: Diagnosis not present

## 2020-05-26 DIAGNOSIS — I4891 Unspecified atrial fibrillation: Secondary | ICD-10-CM | POA: Diagnosis not present

## 2020-05-26 DIAGNOSIS — F329 Major depressive disorder, single episode, unspecified: Secondary | ICD-10-CM | POA: Diagnosis not present

## 2020-05-26 DIAGNOSIS — K219 Gastro-esophageal reflux disease without esophagitis: Secondary | ICD-10-CM | POA: Diagnosis not present

## 2020-05-26 DIAGNOSIS — F062 Psychotic disorder with delusions due to known physiological condition: Secondary | ICD-10-CM | POA: Diagnosis not present

## 2020-05-26 DIAGNOSIS — I10 Essential (primary) hypertension: Secondary | ICD-10-CM | POA: Diagnosis not present

## 2020-05-26 DIAGNOSIS — F060 Psychotic disorder with hallucinations due to known physiological condition: Secondary | ICD-10-CM | POA: Diagnosis not present

## 2020-05-26 DIAGNOSIS — F909 Attention-deficit hyperactivity disorder, unspecified type: Secondary | ICD-10-CM | POA: Diagnosis not present

## 2020-05-26 DIAGNOSIS — G3183 Dementia with Lewy bodies: Secondary | ICD-10-CM | POA: Diagnosis not present

## 2020-05-26 DIAGNOSIS — Z9089 Acquired absence of other organs: Secondary | ICD-10-CM | POA: Diagnosis not present

## 2020-05-26 DIAGNOSIS — R42 Dizziness and giddiness: Secondary | ICD-10-CM | POA: Diagnosis not present

## 2020-05-26 DIAGNOSIS — Z9181 History of falling: Secondary | ICD-10-CM | POA: Diagnosis not present

## 2020-05-26 DIAGNOSIS — F0281 Dementia in other diseases classified elsewhere with behavioral disturbance: Secondary | ICD-10-CM | POA: Diagnosis not present

## 2020-05-26 DIAGNOSIS — F419 Anxiety disorder, unspecified: Secondary | ICD-10-CM | POA: Diagnosis not present

## 2020-05-26 DIAGNOSIS — G43909 Migraine, unspecified, not intractable, without status migrainosus: Secondary | ICD-10-CM | POA: Diagnosis not present

## 2020-05-27 ENCOUNTER — Other Ambulatory Visit: Payer: Self-pay

## 2020-05-27 ENCOUNTER — Ambulatory Visit (HOSPITAL_COMMUNITY)
Admission: RE | Admit: 2020-05-27 | Discharge: 2020-05-27 | Disposition: A | Discharge status: Home / Self Care | Payer: Medicare Other | Source: Ambulatory Visit | Attending: Internal Medicine | Admitting: Internal Medicine

## 2020-05-27 DIAGNOSIS — D649 Anemia, unspecified: Secondary | ICD-10-CM | POA: Diagnosis not present

## 2020-05-27 MED ORDER — SODIUM CHLORIDE 0.9 % IV SOLN
510.0000 mg | Freq: Once | INTRAVENOUS | Status: AC
  Administered 2020-05-27: 510 mg via INTRAVENOUS
  Filled 2020-05-27: qty 510

## 2020-05-27 MED ORDER — SODIUM CHLORIDE 0.9 % IV SOLN
INTRAVENOUS | Status: DC | PRN
  Administered 2020-05-27: 250 mL via INTRAVENOUS

## 2020-05-27 NOTE — Discharge Instructions (Signed)

## 2020-05-27 NOTE — Progress Notes (Signed)
Patient received IV Feraheme as ordered by Loma Boston NP. Observed for at least 30 minutes post infusion.Tolerated well, vitals stable, discharge instructions given, verbalized understanding. Patient alert, oriented and ambulatory at the time of discharge.

## 2020-05-28 DIAGNOSIS — F0281 Dementia in other diseases classified elsewhere with behavioral disturbance: Secondary | ICD-10-CM | POA: Diagnosis not present

## 2020-05-28 DIAGNOSIS — M5416 Radiculopathy, lumbar region: Secondary | ICD-10-CM | POA: Diagnosis not present

## 2020-05-28 DIAGNOSIS — F062 Psychotic disorder with delusions due to known physiological condition: Secondary | ICD-10-CM | POA: Diagnosis not present

## 2020-05-28 DIAGNOSIS — F060 Psychotic disorder with hallucinations due to known physiological condition: Secondary | ICD-10-CM | POA: Diagnosis not present

## 2020-05-28 DIAGNOSIS — F909 Attention-deficit hyperactivity disorder, unspecified type: Secondary | ICD-10-CM | POA: Diagnosis not present

## 2020-05-28 DIAGNOSIS — G3183 Dementia with Lewy bodies: Secondary | ICD-10-CM | POA: Diagnosis not present

## 2020-05-31 ENCOUNTER — Other Ambulatory Visit: Payer: Self-pay | Admitting: Cardiovascular Disease

## 2020-06-03 DIAGNOSIS — F331 Major depressive disorder, recurrent, moderate: Secondary | ICD-10-CM | POA: Diagnosis not present

## 2020-06-03 DIAGNOSIS — G20 Parkinson's disease: Secondary | ICD-10-CM | POA: Diagnosis not present

## 2020-06-05 ENCOUNTER — Ambulatory Visit: Payer: Medicare Other | Admitting: Obstetrics & Gynecology

## 2020-06-05 NOTE — Progress Notes (Deleted)
Date:  06/05/2020   ID:  Katherine [name], Katherine [name] June 14, 1951, MRN [id number]  Patient Location:  [place] [address]   Provider location:   [facility], [place] office  PCP:  [name], NP  Cardiologist:  [facility]   No chief complaint on file.    History of Present Illness:    Katherine [name] is a 69 y.o. 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  Back pain Previously started on spironolactone for low potassium     March 2019  echoand stress test EF 65% no signifcant valvular disease.  No ischemia on stress   back surgery with Dr. Arnoldo Morale, disc bulge 2    Prior CV studies:   The following studies were reviewed today:    Past Medical History:  Diagnosis Date  . Anemia 2018  . Anxiety   . Arthritis   . Borderline diabetes   . Chest pain    a. 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 Chicken pox    age 89  . Chronic back pain    Compressed discs. [place]  . Depression   . Dyspnea on exertion    a. 03/2009 Echo: EF 65%, no rwma, triv TR.  Marland Kitchen GERD (gastroesophageal reflux disease)   . High cholesterol   . Hypertension   . Hypokalemia   . Migraine headache   . Obesity   . Parkinson's disease ([place])   . Pre-diabetes    okay now    Past Surgical History:   Procedure Laterality Date  . BLADDER SUSPENSION  2005  . CARDIAC CATHETERIZATION    . CESAREAN SECTION    . CLOSED REDUCTION NASAL FRACTURE N/A 01/11/2020   Procedure: CLOSED REDUCTION NASAL FRACTURE;  Surgeon: Laveta Gust, MD;  Location: [place];  Service: ENT;  Laterality: N/A;  . ENDOMETRIAL ABLATION  2007  . FOOT SURGERY     bilateral bunions  . LUMBAR LAMINECTOMY/DECOMPRESSION MICRODISCECTOMY Bilateral 01/12/2016   Procedure: Bilateral L4-5 Laminotomy/Foraminotomy;  Surgeon: Newman Pies, MD;  Location: [facility];  Service: Neurosurgery;  Laterality: Bilateral;  Bilateral L4-5 Laminotomy/Foraminotomy  . TONSILLECTOMY  1971      Allergies:   Dilaudid [hydromorphone], Codeine, Darvon, Hydrocodone, Meperidine, Oxycodone, Tramadol, and Victoza [liraglutide]   Social History   Tobacco Use  . Smoking status: Never Smoker  . Smokeless tobacco: Never Used  Vaping Use  . Vaping Use: Never used  Substance Use Topics  . Alcohol use: No  . Drug use: No     Current Outpatient Medications on File Prior to Visit  Medication Sig Dispense Refill  . acetaminophen (TYLENOL) 325 MG tablet Take 650 mg by mouth every 6 (six) hours as needed.    Marland Kitchen buPROPion (WELLBUTRIN XL) 150 MG 24 hr tablet Take 150 mg by mouth at bedtime.    Marland Kitchen  carvedilol (COREG) 6.25 MG tablet Take 1 tablet (6.25 mg total) by mouth 2 (two) times daily with a meal. Please keep your upcoming appointment for refills. 60 tablet 0  . cetirizine (ZYRTEC) 10 MG tablet Take 10 mg by mouth daily as needed for allergies.    . diazepam (VALIUM) 10 MG tablet Take 10 mg by mouth at bedtime.  2  . DULoxetine (CYMBALTA) 30 MG capsule Take 30 mg by mouth daily. (Total dose = 150 mg)    . DULoxetine (CYMBALTA) 60 MG capsule Take 120 mg by mouth every morning. (Total dose = 150 mg)    . gabapentin (NEURONTIN) 300 MG capsule Take 1 capsule (300 mg total) by mouth at bedtime. For back pain. 90 capsule 1  . Melatonin 12 MG  TABS Take by mouth at bedtime as needed.    . Multiple Vitamin (MULTIVITAMIN) tablet Take 1 tablet by mouth daily.    . NUPLAZID 34 MG CAPS Take 1 capsule by mouth daily.    . ondansetron (ZOFRAN) 8 MG tablet Take 8 mg by mouth every 8 (eight) hours as needed.    Marland Kitchen OVER THE COUNTER MEDICATION Olly Beauty vitamin    . pantoprazole (PROTONIX) 40 MG tablet TAKE 1 TABLET BY MOUTH EVERY DAY 90 tablet 1  . QUEtiapine (SEROQUEL) 50 MG tablet Take 50 mg by mouth in the morning and at bedtime.    . simvastatin (ZOCOR) 20 MG tablet Take 1 tablet (20 mg total) by mouth at bedtime. 90 tablet 3  . spironolactone (ALDACTONE) 25 MG tablet TAKE 1 TABLET BY MOUTH TWICE A DAY 60 tablet 0  . sulfamethoxazole-trimethoprim (BACTRIM DS) 800-160 MG tablet Take 1 tablet by mouth 2 (two) times daily. For urinary tract infection. 10 tablet 0  . SUMAtriptan (IMITREX) 100 MG tablet Take by mouth.    . topiramate (TOPAMAX) 50 MG tablet Take 50 mg by mouth daily.     . traMADol (ULTRAM) 50 MG tablet Take 1 tablet (50 mg total) by mouth every 6 (six) hours as needed. 30 tablet 0   No current facility-administered medications on file prior to visit.     Family Hx: The patient's family history includes Breast cancer (age of onset: 25) in her sister; Cancer in her maternal grandfather and mother; Cancer (age of onset: 31) in her brother; Diabetes in her brother, father, mother, and sister; Esophageal cancer in her brother; Heart disease in her brother, father, and mother; Heart failure in her brother and mother; Hypertension in her father and mother; Liver disease in her father. There is no history of Colon cancer, Stomach cancer, or Rectal cancer.  ROS:   Please see the history of present illness.    Review of Systems  Constitutional: Negative.   HENT: Negative.   Respiratory: Negative.   Cardiovascular: Negative.   Gastrointestinal: Negative.   Musculoskeletal: Negative.   Neurological: Positive for dizziness.   Psychiatric/Behavioral: Negative.   All other systems reviewed and are negative.     Labs/Other Tests and Data Reviewed:    Recent Labs: 04/15/2020: ALT 6; BUN 19; Creatinine, Ser 0.80; Potassium 4.4; Sodium 135 05/19/2020: Hemoglobin 9.9; Platelets 363.0   Recent Lipid Panel Lab Results  Component Value Date/Time   CHOL 130 04/15/2020 12:41 PM   CHOL 123 06/22/2013 08:09 AM   TRIG 85.0 04/15/2020 12:41 PM   HDL 52.20 04/15/2020 12:41 PM   HDL 48 06/22/2013 08:09 AM   CHOLHDL 2 04/15/2020 12:41 PM   LDLCALC 60 04/15/2020  12:41 PM   LDLCALC 60 06/22/2013 08:09 AM   LDLDIRECT 92.0 05/10/2018 11:10 AM    Wt Readings from Last 3 Encounters:  04/15/20 179 lb (81.2 kg)  02/15/20 187 lb 8 oz (85 kg)  02/13/20 185 lb 8 oz (84.1 kg)     Exam:    Vital Signs: Vital signs may also be detailed in the HPI There were no vitals taken for this visit.  Wt Readings from Last 3 Encounters:  04/15/20 179 lb (81.2 kg)  02/15/20 187 lb 8 oz (85 kg)  02/13/20 185 lb 8 oz (84.1 kg)   Temp Readings from Last 3 Encounters:  05/27/20 (!) 97.5 F (36.4 C) (Oral)  05/16/20 97.9 F (36.6 C) (Oral)  04/15/20 (!) 96.6 F (35.9 C) (Temporal)   BP Readings from Last 3 Encounters:  05/27/20 (!) 104/58  05/16/20 (!) 108/56  04/15/20 118/74   Pulse Readings from Last 3 Encounters:  05/27/20 80  05/16/20 85  04/15/20 100     Well nourished, well developed female in no acute distress. Constitutional:  oriented to person, place, and time. No distress.  Head: Normocephalic and atraumatic.  Eyes:  no discharge. No scleral icterus.  Neck: Normal range of motion. Neck supple.  Pulmonary/Chest: No audible wheezing, no distress, appears comfortable Musculoskeletal: Normal range of motion.  no  tenderness or deformity.  Neurological:   Coordination normal. Full exam not performed Skin:  No rash Psychiatric:  normal mood and affect. behavior is normal. Thought content normal.    ASSESSMENT &  PLAN:    Problem List Items Addressed This Visit    None     Dizziness Etiology unclear, concerned about low blood pressure and orthostasis We will decrease the coreg down to 6.25 BID Monitor blood pressure and call in 2 to 3 weeks  Falls Unsteady gait, Suggested a cane Has an MRI today, head  Tachycardia We will decrease the carvedilol, she will call us if tachycardia symptoms recur Previously have been well controlled on carvedilol 12.5 but I am concerned about low blood pressure   COVID-19 Education: The signs and symptoms of COVID-19 were discussed with the patient and how to seek care for testing (follow up with PCP or arrange E-visit).  The importance of social distancing was discussed today.  Patient Risk:   After full review of this patients clinical status, I feel that they are at least moderate risk at this time.  Time:   Today, I have spent 25 minutes with the patient with telehealth technology discussing the cardiac and medical problems/diagnoses detailed above   Additional 10 min spent reviewing the chart prior to patient visit today   Medication Adjustments/Labs and Tests Ordered: Current medicines are reviewed at length with the patient today.  Concerns regarding medicines are outlined above.   Tests Ordered: No tests ordered   Medication Changes: No changes made   Disposition: Follow-up in 6 months   Signed, Ida Rogue, MD  [place] Office [address] [place] [address], [place], [place] [address]

## 2020-06-06 ENCOUNTER — Ambulatory Visit: Payer: Medicare Other | Admitting: Cardiovascular Disease

## 2020-06-06 DIAGNOSIS — R7303 Prediabetes: Secondary | ICD-10-CM

## 2020-06-06 DIAGNOSIS — R079 Chest pain, unspecified: Secondary | ICD-10-CM

## 2020-06-06 DIAGNOSIS — F060 Psychotic disorder with hallucinations due to known physiological condition: Secondary | ICD-10-CM | POA: Diagnosis not present

## 2020-06-06 DIAGNOSIS — F0281 Dementia in other diseases classified elsewhere with behavioral disturbance: Secondary | ICD-10-CM | POA: Diagnosis not present

## 2020-06-06 DIAGNOSIS — G3183 Dementia with Lewy bodies: Secondary | ICD-10-CM | POA: Diagnosis not present

## 2020-06-06 DIAGNOSIS — G4733 Obstructive sleep apnea (adult) (pediatric): Secondary | ICD-10-CM

## 2020-06-06 DIAGNOSIS — F909 Attention-deficit hyperactivity disorder, unspecified type: Secondary | ICD-10-CM | POA: Diagnosis not present

## 2020-06-06 DIAGNOSIS — I10 Essential (primary) hypertension: Secondary | ICD-10-CM

## 2020-06-06 DIAGNOSIS — F062 Psychotic disorder with delusions due to known physiological condition: Secondary | ICD-10-CM | POA: Diagnosis not present

## 2020-06-06 DIAGNOSIS — E782 Mixed hyperlipidemia: Secondary | ICD-10-CM

## 2020-06-06 DIAGNOSIS — I479 Paroxysmal tachycardia, unspecified: Secondary | ICD-10-CM

## 2020-06-06 DIAGNOSIS — M5416 Radiculopathy, lumbar region: Secondary | ICD-10-CM | POA: Diagnosis not present

## 2020-06-09 DIAGNOSIS — F909 Attention-deficit hyperactivity disorder, unspecified type: Secondary | ICD-10-CM | POA: Diagnosis not present

## 2020-06-09 DIAGNOSIS — F060 Psychotic disorder with hallucinations due to known physiological condition: Secondary | ICD-10-CM | POA: Diagnosis not present

## 2020-06-09 DIAGNOSIS — F0281 Dementia in other diseases classified elsewhere with behavioral disturbance: Secondary | ICD-10-CM | POA: Diagnosis not present

## 2020-06-09 DIAGNOSIS — M5416 Radiculopathy, lumbar region: Secondary | ICD-10-CM | POA: Diagnosis not present

## 2020-06-09 DIAGNOSIS — F062 Psychotic disorder with delusions due to known physiological condition: Secondary | ICD-10-CM | POA: Diagnosis not present

## 2020-06-09 DIAGNOSIS — G3183 Dementia with Lewy bodies: Secondary | ICD-10-CM | POA: Diagnosis not present

## 2020-06-10 ENCOUNTER — Other Ambulatory Visit: Payer: Self-pay

## 2020-06-10 ENCOUNTER — Encounter: Payer: Self-pay | Admitting: Physician Assistant

## 2020-06-10 ENCOUNTER — Ambulatory Visit (INDEPENDENT_AMBULATORY_CARE_PROVIDER_SITE_OTHER): Payer: Medicare Other | Admitting: Physician Assistant

## 2020-06-10 VITALS — BP 120/80 | HR 96 | Ht 62.0 in | Wt 182.0 lb

## 2020-06-10 DIAGNOSIS — R079 Chest pain, unspecified: Secondary | ICD-10-CM | POA: Diagnosis not present

## 2020-06-10 DIAGNOSIS — G4730 Sleep apnea, unspecified: Secondary | ICD-10-CM | POA: Diagnosis not present

## 2020-06-10 DIAGNOSIS — R0600 Dyspnea, unspecified: Secondary | ICD-10-CM

## 2020-06-10 DIAGNOSIS — E782 Mixed hyperlipidemia: Secondary | ICD-10-CM | POA: Diagnosis not present

## 2020-06-10 DIAGNOSIS — R202 Paresthesia of skin: Secondary | ICD-10-CM | POA: Diagnosis not present

## 2020-06-10 DIAGNOSIS — Z8639 Personal history of other endocrine, nutritional and metabolic disease: Secondary | ICD-10-CM

## 2020-06-10 DIAGNOSIS — I10 Essential (primary) hypertension: Secondary | ICD-10-CM | POA: Diagnosis not present

## 2020-06-10 DIAGNOSIS — R000 Tachycardia, unspecified: Secondary | ICD-10-CM

## 2020-06-10 MED ORDER — CARVEDILOL 12.5 MG PO TABS: 12.5000 mg | ORAL_TABLET | Freq: Two times a day (BID) | ORAL | 1 refills | Status: DC

## 2020-06-10 NOTE — Patient Instructions (Signed)
Medication Instructions:   Your physician has recommended you make the following change in your medication:   1.  INCREASE your Carvedilol to 12.5 MG twice daily.  *If you need a refill on your cardiac medications before your next appointment, please call your pharmacy*   Lab Work: None Ordered If you have labs (blood work) drawn today and your tests are completely normal, you will receive your results only by: Marland Kitchen MyChart Message (if you have MyChart) OR . A paper copy in the mail If you have any lab test that is abnormal or we need to change your treatment, we will call you to review the results.   Testing/Procedures:  1.  Your physician has requested that you have an echocardiogram. Echocardiography is a painless test that uses sound waves to create images of your heart. It provides your doctor with information about the size and shape of your heart and how well your heart's chambers and valves are working. This procedure takes approximately one hour. There are no restrictions for this procedure.     Follow-Up: At [facility], you and your health needs are our priority.  As part of our continuing mission to provide you with exceptional heart care, we have created designated Provider Care Teams.  These Care Teams include your primary Cardiologist (physician) and Advanced Practice Providers (APPs -  Physician Assistants and Nurse Practitioners) who all work together to provide you with the care you need, when you need it.  We recommend signing up for the patient portal called "MyChart".  Sign up information is provided on this After Visit Summary.  MyChart is used to connect with patients for Virtual Visits (Telemedicine).  Patients are able to view lab/test results, encounter notes, upcoming appointments, etc.  Non-urgent messages can be sent to your provider as well.   To learn more about what you can do with MyChart, go to [email or web address].    Your next appointment:    Follow up after Echo   The format for your next appointment:   In Person  Provider:   You may see Katherine Rogue, MD or one of the following Advanced Practice Providers on your designated Care Team:    Murray Hodgkins, NP  Christell Faith, PA-C  Marrianne Mood, PA-C  Cadence [place], [place]  Laurann [place], NP

## 2020-06-10 NOTE — Progress Notes (Signed)
Office Visit    Patient Name: Katherine [name] Date of Encounter: 06/10/2020  Primary Care Provider:  [name], NP Primary Cardiologist:  Ida Rogue, MD  Chief Complaint    Chief Complaint  Patient presents with  . other    12 month follow up. Meds reviewed by the pt. "doing well."     69 year old female with history of hypertension, hyperlipidemia, hypokalemia, obesity, chest pain, dyspnea on exertion, Parkinson's, Lewy body dementia iron deficiency anemia, back pain s/p back surgery with Dr. Arnoldo Morale (disc bulge), GERD, and who presents for 1 year follow-up.  Past Medical History    Past Medical History:  Diagnosis Date  . Anemia 2018  . Anxiety   . Arthritis   . Borderline diabetes   . Chest pain    a. 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 Chicken pox    age 2  . Chronic back pain    Compressed discs. [place]  . Depression   . Dyspnea on exertion    a. 03/2009 Echo: EF 65%, no rwma, triv TR.  Marland Kitchen GERD (gastroesophageal reflux disease)   . High cholesterol   . Hypertension   . Hypokalemia   . Migraine headache   . Obesity   . Parkinson's disease ([place])   . Pre-diabetes    okay now    Past Surgical History:  Procedure Laterality Date  . BLADDER SUSPENSION  2005  . CARDIAC CATHETERIZATION    . CESAREAN SECTION    . CLOSED REDUCTION NASAL FRACTURE N/A 01/11/2020   Procedure: CLOSED REDUCTION NASAL FRACTURE;  Surgeon: Dazhane Gust, MD;  Location: [facility];  Service: ENT;  Laterality: N/A;  . ENDOMETRIAL ABLATION  2007  . FOOT SURGERY     bilateral bunions  . LUMBAR LAMINECTOMY/DECOMPRESSION MICRODISCECTOMY Bilateral 01/12/2016   Procedure: Bilateral L4-5 Laminotomy/Foraminotomy;  Surgeon: Newman Pies, MD;  Location: [facility];  Service: Neurosurgery;  Laterality: Bilateral;  Bilateral L4-5 Laminotomy/Foraminotomy  .  TONSILLECTOMY  1971    Allergies  Allergies  Allergen Reactions  . Dilaudid [Hydromorphone]     hallucinations  . Codeine Anxiety  . Darvon Anxiety  . Hydrocodone Anxiety  . Meperidine Anxiety  . Oxycodone Anxiety  . Tramadol Anxiety  . Victoza [Liraglutide] Nausea Only and Other (See Comments)    And weakness    History of Present Illness    Katherine [name] is a 69 y.o. female with PMH as above. 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    In 2007, she underwent cardiac catheterization that showed normal cors.  2010 echo showed EF 65% without significant valvular disease or wall motion abnormality.  She was seen in 2019 and reported chronic dyspnea on exertion that limited her activities.  She found walking up an incline very difficult.  She also experienced exertional substernal chest discomfort.  Symptoms usually resolved with 5 to 10 minutes of rest.  Her blood pressure and heart rates had been elevated over the last year.  At rest, her heart rate was often in the low 90s to 100s.  BP typically 140s or greater.  She reported bilateral hand swelling.  She was using a light amount of salt.    March 2019 echo and stress test with EF 65% and no significant valvular disease or signs of ischemia on stress testing.  She was last evaluated via telemedicine 03/06/2019 by  her primary cardiologist, Dr. Rockey Situ.  At that time, she reported dizzy spells.  Specifically, she reported a spell of dizziness that occurred 3 to 4 months earlier while ambulating and that she fell and hurt her back, requiring an epidural. She was seen by ENT and told that she had some hearing issues and a neurologic disorder.  Home SBP 130s to 120s. She had an MRI that showed no active or acute intracranial process.  Today, 06/10/2020, she returns to clinic and reports that she is doing well from a cardiac standpoint. She has been diagnosed with Parkinson's and is following with both [facility]  neurology. She reports that she started physical therapy and has noted increasing exercise tolerance since that time. No further dyspnea on exertion since starting PT. She does not feel dizzy or experience falls anymore. The only time that she experiences dizziness is when getting up too quickly from bed, consistent with orthostatic hypotension. She also still feels dizzy when laying down. She reports SBP in the 120s at home. She has noted elevated heart rate, which she states is concerning to her. She occasionally notices the racing heart rate but denies any palpitations. She denies any signs or symptoms of volume overload including orthopnea, PND, or early satiety. She does note that her right leg will occasionally bother her. She reports paresthesias of her bilateral legs and numbness in both feet. She feels that these paresthesias may be related to her sugar versus back issues. She has recently experienced chest pain on the left side of her chest that begins in her shoulder and is decided as a cramp, usually severe, and lasting 5 to 10 minutes. She is uncertain if this is related to position or tender to palpation. She does feel that it is worse with deeper breathing. The chest pain usually occurs at night and at rest. She denies any associated symptoms. She does not feel that the chest pain occurs with exertion.  Home Medications    Current Outpatient Medications on File Prior to Visit  Medication Sig Dispense Refill  . acetaminophen (TYLENOL) 325 MG tablet Take 650 mg by mouth every 6 (six) hours as needed.    Marland Kitchen buPROPion (WELLBUTRIN XL) 150 MG 24 hr tablet Take 150 mg by mouth at bedtime.    . carvedilol (COREG) 6.25 MG tablet Take 1 tablet (6.25 mg total) by mouth 2 (two) times daily with a meal. Please keep your upcoming appointment for refills. 60 tablet 0  . cetirizine (ZYRTEC) 10 MG tablet Take 10 mg by mouth daily as needed for allergies.    . diazepam (VALIUM) 10 MG tablet Take 10 mg by mouth  at bedtime.  2  . DULoxetine (CYMBALTA) 30 MG capsule Take 30 mg by mouth daily. (Total dose = 150 mg)    . DULoxetine (CYMBALTA) 60 MG capsule Take 120 mg by mouth every morning. (Total dose = 150 mg)    . gabapentin (NEURONTIN) 300 MG capsule Take 1 capsule (300 mg total) by mouth at bedtime. For back pain. 90 capsule 1  . Melatonin 12 MG TABS Take by mouth at bedtime as needed.    . Multiple Vitamin (MULTIVITAMIN) tablet Take 1 tablet by mouth daily.    . NUPLAZID 34 MG CAPS Take 1 capsule by mouth daily.    . ondansetron (ZOFRAN) 8 MG tablet Take 8 mg by mouth every 8 (eight) hours as needed.    Marland Kitchen OVER THE COUNTER MEDICATION [employer] vitamin    .  pantoprazole (PROTONIX) 40 MG tablet TAKE 1 TABLET BY MOUTH EVERY DAY 90 tablet 1  . simvastatin (ZOCOR) 20 MG tablet Take 1 tablet (20 mg total) by mouth at bedtime. 90 tablet 3  . spironolactone (ALDACTONE) 25 MG tablet TAKE 1 TABLET BY MOUTH TWICE A DAY 60 tablet 0  . sulfamethoxazole-trimethoprim (BACTRIM DS) 800-160 MG tablet Take 1 tablet by mouth 2 (two) times daily. For urinary tract infection. 10 tablet 0  . SUMAtriptan (IMITREX) 100 MG tablet Take by mouth.    . topiramate (TOPAMAX) 50 MG tablet Take 50 mg by mouth daily.     . traMADol (ULTRAM) 50 MG tablet Take 1 tablet (50 mg total) by mouth every 6 (six) hours as needed. 30 tablet 0   No current facility-administered medications on file prior to visit.    Review of Systems    She deniespalpitations, dyspnea, pnd, orthopnea, n, v, dizziness, syncope, edema, weight gain, or early satiety. She reports chest pain that occurs at rest and at night, described as cramping, and usually lasting minutes then self resolving. She reports resolution of previous dizziness and dyspnea. She reports bilateral lower extremity paresthesias, specifically numbness in her feet and pain in her right leg. She reports racing heart rate..   All other systems reviewed and are otherwise negative except as  noted above.  Physical Exam    VS:  BP 120/80 (BP Location: Left Arm, Patient Position: Sitting, Cuff Size: Normal)   Pulse 96   Ht 5\' 2"  (1.575 m)   Wt 182 lb (82.6 kg)   SpO2 98%   BMI 33.29 kg/m  , BMI Body mass index is 33.29 kg/m. GEN: Well nourished, well developed, in no acute distress. HEENT: normal. Neck: Supple, no JVD, carotid bruits, or masses. Cardiac: RRR, no murmurs, rubs, or gallops. No clubbing, cyanosis, pitting edema.  Radials/DP/PT 2+ and equal bilaterally.  Respiratory:  Respirations regular and unlabored, clear to auscultation bilaterally. GI: Soft, nontender, nondistended, BS + x 4. MS: no deformity or atrophy. Skin: warm and dry, no rash. Neuro:  Strength and sensation are intact. Psych: Normal affect.  Accessory Clinical Findings    ECG personally reviewed by me today -NSR, 96 bpm, PR interval 124 ms, QRS 72 ms, QTC 427 ms, no acute changes from previous EKG- no acute changes.  VITALS Reviewed today   Temp Readings from Last 3 Encounters:  05/27/20 (!) 97.5 F (36.4 C) (Oral)  05/16/20 97.9 F (36.6 C) (Oral)  04/15/20 (!) 96.6 F (35.9 C) (Temporal)   BP Readings from Last 3 Encounters:  06/10/20 120/80  05/27/20 (!) 104/58  05/16/20 (!) 108/56   Pulse Readings from Last 3 Encounters:  06/10/20 96  05/27/20 80  05/16/20 85    Wt Readings from Last 3 Encounters:  06/10/20 182 lb (82.6 kg)  04/15/20 179 lb (81.2 kg)  02/15/20 187 lb 8 oz (85 kg)     LABS  reviewed today   Lab Results  Component Value Date   WBC 10.8 (H) 05/19/2020   HGB 9.9 (L) 05/19/2020   HCT 32.6 (L) 05/19/2020   MCV 73.1 (L) 05/19/2020   PLT 363.0 05/19/2020   Lab Results  Component Value Date   CREATININE 0.80 04/15/2020   BUN 19 04/15/2020   NA 135 04/15/2020   K 4.4 04/15/2020   CL 105 04/15/2020   CO2 23 04/15/2020   Lab Results  Component Value Date   ALT 6 04/15/2020   AST 10 04/15/2020  ALKPHOS 98 04/15/2020   BILITOT 0.5 04/15/2020    Lab Results  Component Value Date   CHOL 130 04/15/2020   HDL 52.20 04/15/2020   LDLCALC 60 04/15/2020   LDLDIRECT 92.0 05/10/2018   TRIG 85.0 04/15/2020   CHOLHDL 2 04/15/2020    Lab Results  Component Value Date   HGBA1C 6.3 04/15/2020   Lab Results  Component Value Date   TSH 1.93 03/13/2018     STUDIES/PROCEDURES reviewed today   Echo 2019 - Left ventricle: The cavity size was normal. Systolic function was  normal. The estimated ejection fraction was in the range of 55%  to 60%. Wall motion was normal; there were no regional wall  motion abnormalities. Doppler parameters are consistent with  abnormal left ventricular relaxation (grade 1 diastolic  dysfunction).  - Left atrium: The atrium was normal in size.  - Right ventricle: Systolic function was normal.  - Pulmonary arteries: Systolic pressure was within the normal  range.   MPI 2019  Normal pharmacologic myocardial perfusion stress test without ischemia or scar.  Small, hyperdynamic left ventricle with normal wall motion. LVEF >65%.  This is a low risk study.  In 2007, she underwent cardiac catheterization that showed normal cors.  Assessment & Plan    Atypical CP --Reports chest pain that occurs at rest and usually at night, lasting several minutes before self resolving.  CP atypical in that it occurs at rest and not with exertion. On her description, CP seems more consistent with MSK etiology than that of coronary insufficiency. She is uncertain if it is tender to palpation or changes with position but does feel it is worse with deeper breathing. Previous cardiac work-up in 2007 showed normal cors. MPI 2019 low risk. Echo 2019 with normal EF, G1DD. --Given her report of chest pain and racing heart rate, we will update an echo. If EF reduced, WMA, or acute structural abnormalities, further ischemic work-up at that time. --Increase Coreg to 12.5 mg twice daily for additional antianginal  support. As below, if she still experiences racing heart rate, will obtain Zio monitoring. --Continue current BB, statin, and spironolactone.  Tachycardia --Reports elevated heart rate and at times racing heart rate without palpitations. No further dizziness or falls. She continues on her carvedilol with rates in the 80s to 90s. She notes occasional escalation at home with higher rates in the 100s. Given her room in heart rate and diastolic pressure today, we will increase her carvedilol to Coreg 12.5 mg twice daily for further BP and rate support. If she continues to feel her racing heart rate after this increase in Coreg, we will obtain cardiac monitoring with ZIO XT x2 weeks as discussed today.  Essential hypertension --BP relatively well controlled today with DBP borderline. As above, we will increase her carvedilol for further BP and rate support to Coreg 12.5 mg twice daily. Continue with spironolactone 25 mg daily.  H/o hypokalemia --Continue spironolactone 25 mg daily.  Hyperlipidemia --Most recent LDL well controlled. Continue current simvastatin 20 mg daily.  Falls, resolved --No further falls since starting PT with diagnosis of Parkinson's and Lewy body dementia. Continue PT.  Bilateral lower extremity paresthesias --Bilateral lower extremity paresthesias with pain and numbness. On further description, paresthesias sound most consistent with nerve/spine etiology; however, reassess at RTC. We reviewed the signs and symptoms of PAD today. Continue to control cholesterol. Ongoing glycemic control recommended.  Sleep disordered breathing/sleep apnea --Pulmonology referral deferred today. We reviewed the benefits of treated sleep apnea.  Medication changes: Increase to Coreg 12.5 mg twice daily Labs ordered: None Studies / Imaging ordered: Echo. Following echo, obtain Zio monitoring if tachycardia is still noticeable with increasing Coreg, obtain Zio XT x2 weeks. Future  considerations: Pending echo, ?LE studies if indicated Disposition: RTC after echo   Arvil Chaco, PA-C 06/10/2020

## 2020-06-16 ENCOUNTER — Other Ambulatory Visit: Payer: Self-pay | Admitting: Cardiovascular Disease

## 2020-06-16 DIAGNOSIS — M5416 Radiculopathy, lumbar region: Secondary | ICD-10-CM | POA: Diagnosis not present

## 2020-06-16 DIAGNOSIS — F909 Attention-deficit hyperactivity disorder, unspecified type: Secondary | ICD-10-CM | POA: Diagnosis not present

## 2020-06-16 DIAGNOSIS — G3183 Dementia with Lewy bodies: Secondary | ICD-10-CM | POA: Diagnosis not present

## 2020-06-16 DIAGNOSIS — F062 Psychotic disorder with delusions due to known physiological condition: Secondary | ICD-10-CM | POA: Diagnosis not present

## 2020-06-16 DIAGNOSIS — F0281 Dementia in other diseases classified elsewhere with behavioral disturbance: Secondary | ICD-10-CM | POA: Diagnosis not present

## 2020-06-16 DIAGNOSIS — F060 Psychotic disorder with hallucinations due to known physiological condition: Secondary | ICD-10-CM | POA: Diagnosis not present

## 2020-06-19 ENCOUNTER — Other Ambulatory Visit: Payer: Self-pay | Admitting: Primary Care

## 2020-06-19 ENCOUNTER — Other Ambulatory Visit: Payer: Self-pay | Admitting: Cardiovascular Disease

## 2020-06-19 DIAGNOSIS — F060 Psychotic disorder with hallucinations due to known physiological condition: Secondary | ICD-10-CM | POA: Diagnosis not present

## 2020-06-19 DIAGNOSIS — G20 Parkinson's disease: Secondary | ICD-10-CM | POA: Diagnosis not present

## 2020-06-19 DIAGNOSIS — R441 Visual hallucinations: Secondary | ICD-10-CM | POA: Diagnosis not present

## 2020-06-19 DIAGNOSIS — F22 Delusional disorders: Secondary | ICD-10-CM | POA: Diagnosis not present

## 2020-06-19 DIAGNOSIS — F062 Psychotic disorder with delusions due to known physiological condition: Secondary | ICD-10-CM | POA: Diagnosis not present

## 2020-06-19 DIAGNOSIS — F0281 Dementia in other diseases classified elsewhere with behavioral disturbance: Secondary | ICD-10-CM | POA: Diagnosis not present

## 2020-06-19 DIAGNOSIS — M4316 Spondylolisthesis, lumbar region: Secondary | ICD-10-CM

## 2020-06-19 DIAGNOSIS — G3183 Dementia with Lewy bodies: Secondary | ICD-10-CM | POA: Diagnosis not present

## 2020-06-20 NOTE — Telephone Encounter (Signed)
Pharmacy requests refill on: Gabapentin 300 mg   LAST REFILL: 02/14/2020 (Q-90, R-1) LAST OV: 04/15/2020 NEXT OV: 06/23/2020  PHARMACY: [id number] [place], [place]

## 2020-06-20 NOTE — Telephone Encounter (Signed)
Refills sent to pharmacy. 

## 2020-06-23 ENCOUNTER — Ambulatory Visit: Payer: Medicare Other | Admitting: Primary Care

## 2020-06-27 ENCOUNTER — Telehealth: Payer: Self-pay | Admitting: Primary Care

## 2020-06-27 NOTE — Telephone Encounter (Signed)
Patient called to cancel her appointment on Tuesday. She said she's feeling better.  Patient wanted to wish Anda Kraft a Merry Christmas and Happy New Year.

## 2020-06-27 NOTE — Telephone Encounter (Signed)
Noted, that was very kind of her!

## 2020-07-01 ENCOUNTER — Ambulatory Visit: Payer: Medicare Other | Admitting: Primary Care

## 2020-07-01 DIAGNOSIS — Z20822 Contact with and (suspected) exposure to covid-19: Secondary | ICD-10-CM | POA: Diagnosis not present

## 2020-07-03 ENCOUNTER — Ambulatory Visit (INDEPENDENT_AMBULATORY_CARE_PROVIDER_SITE_OTHER): Payer: Medicare Other

## 2020-07-03 ENCOUNTER — Other Ambulatory Visit: Payer: Self-pay

## 2020-07-03 DIAGNOSIS — R0600 Dyspnea, unspecified: Secondary | ICD-10-CM | POA: Diagnosis not present

## 2020-07-03 LAB — ECHOCARDIOGRAM COMPLETE
Area-P 1/2: 2.7 cm2
S' Lateral: 1.7 cm

## 2020-07-08 ENCOUNTER — Telehealth: Payer: Self-pay | Admitting: *Deleted

## 2020-07-08 NOTE — Telephone Encounter (Signed)
Attempted to call pt with echo results and recc below. No answer. LMOM TCB.

## 2020-07-08 NOTE — Telephone Encounter (Signed)
-----   Message from Arvil Chaco, PA-C sent at 07/07/2020  5:48 PM EST ----- --Pump function normal. --Walls of heart are of normal thickness. --Walls of the heart are moving normally though mildly slow to relax, as seen in her 2019 echo. This is often seen with elevated BP over time. --Mildly leaky tricuspid valve, which is not concerning, and with mildly elevated pressure of the bottom right heart chamber, which we can continue to monitor with periodic echo.  Recommend heart rate and blood pressure control.  Keep all fluids under 2L daily and salt under 2g. Hopefully, the increased Coreg has helped with her symptoms.

## 2020-07-08 NOTE — Telephone Encounter (Signed)
Spoke to pt, notified of echo results. Pt verbalized understanding of recc.  Pt states that the incr dose of Coreg has caused HR to decr approx 12 points on average, however, she does still "tire out quickly". Per last note, if s/s not improved from incr dose of Coreg, may consider zio monitor. Pt does have follow up appt w/ Dr. Rockey Situ 07/22/20, I advised pt to keep a daily log of BP/HR twice a day and bring with her to appt. Advised pt to call our office if s/s change or worsen in the meantime. Pt has no other questions at this time.

## 2020-07-08 NOTE — Telephone Encounter (Signed)
-----   Message from Jacquelyn D Visser, PA-C sent at 07/07/2020  5:48 PM EST ----- --Pump function normal. --Walls of heart are of normal thickness. --Walls of the heart are moving normally though mildly slow to relax, as seen in her 2019 echo. This is often seen with elevated BP over time. --Mildly leaky tricuspid valve, which is not concerning, and with mildly elevated pressure of the bottom right heart chamber, which we can continue to monitor with periodic echo.  Recommend heart rate and blood pressure control.  Keep all fluids under 2L daily and salt under 2g. Hopefully, the increased Coreg has helped with her symptoms.   

## 2020-07-21 NOTE — Progress Notes (Signed)
Date:  07/22/2020   ID:  Katherine [name], Katherine [name] [birth year], MRN [id number]  Patient Location:  [address] [place] [address]   Provider location:   [facility], [place] office  PCP:  Katherine Koch, NP  Cardiologist:  Katherine [name]  Chief Complaint  Patient presents with  . Follow-up    F/U after echo    History of Present Illness:    Katherine [name] is a 70 y.o. female  past medical history of CP with no CAD on cath 2007,  HTN  Morbid obesity Chronic back pain, prior back surgery Possible sleep apnea by history Parkinson's  She returns today for routine follow-up Of her blood pressure, history of hypokalemia  In follow-up today she reports that she feels well On nuplazid, per [facility] "I can walk", within 5 days Finished PT Hallucinations gone "still walking" "Still losing memory"  Low BP at times and tired with higher dose coreg  Taking coreg 12.5 in the AM, 25 in the PM, "for fast heart rate" No sx from tachycardia  Brings in BP measurements today Some SBP 97-103, on avg 110 to 120  Echo reviewed on her trip today 1. Left ventricular ejection fraction, by estimation, is 60 to 65%. The  left ventricle has normal function. The left ventricle has no regional  wall motion abnormalities. Left ventricular diastolic parameters are  consistent with Grade I diastolic  dysfunction (impaired relaxation).  2. Right ventricular systolic function is normal. The right ventricular  size is normal. There is normal pulmonary artery systolic pressure. The  estimated right ventricular systolic pressure is [id number] mmHg.   Labs reviewed, anemia HGB 9.7 Received iron infusion Total chol 130, LDL 60   Prior CV studies:   The following studies were reviewed today:    Past Medical History:  Diagnosis Date  . Anemia 2018  . Anxiety   . Arthritis   . Borderline diabetes   . Chest pain    a. 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 Chicken pox    age 89  . Chronic back pain    Compressed discs. [place]  . Depression   . Dyspnea on exertion    a. 03/2009 Echo: EF 65%, no rwma, triv TR.  Katherine [name] GERD (gastroesophageal reflux disease)   . High cholesterol   . Hypertension   . Hypokalemia   . Migraine headache   . Obesity   . Parkinson's disease ([place])   . Pre-diabetes    okay now    Past Surgical History:  Procedure Laterality Date  . BLADDER SUSPENSION  2005  . CARDIAC CATHETERIZATION    . CESAREAN SECTION    . CLOSED REDUCTION NASAL FRACTURE N/A 01/11/2020   Procedure: CLOSED REDUCTION NASAL FRACTURE;  Surgeon: Katherine Gust, MD;  Location: [place];  Service: ENT;  Laterality: N/A;  . ENDOMETRIAL ABLATION  2007  . FOOT SURGERY     bilateral bunions  . LUMBAR LAMINECTOMY/DECOMPRESSION MICRODISCECTOMY Bilateral 01/12/2016   Procedure: Bilateral L4-5 Laminotomy/Foraminotomy;  Surgeon: Katherine Pies, MD;  Location: [place] [facility];  Service: Neurosurgery;  Laterality: Bilateral;  Bilateral L4-5 Laminotomy/Foraminotomy  . TONSILLECTOMY  1971      Allergies:   Dilaudid [hydromorphone], Codeine, Darvon, Hydrocodone, Meperidine, Oxycodone, Tramadol, and Victoza [liraglutide]   Social History   Tobacco Use  . Smoking status: Never Smoker  .  Smokeless tobacco: Never Used  Vaping Use  . Vaping Use: Never used  Substance Use Topics  . Alcohol use: No  . Drug use: No     Current Outpatient Medications on File Prior to Visit  Medication Sig Dispense Refill  . acetaminophen (TYLENOL) 325 MG tablet Take 650 mg by mouth every 6 (six) hours as needed.    Katherine [name]. buPROPion (WELLBUTRIN XL) 150 MG 24 hr tablet Take 150 mg by mouth at bedtime.    . carvedilol (COREG) 12.5 MG tablet Take 1 tablet (12.5 mg total) by mouth 2 (two) times daily with a meal. Please keep your upcoming appointment for refills. 180 tablet 1   . diazepam (VALIUM) 10 MG tablet Take 10 mg by mouth at bedtime.  2  . DULoxetine (CYMBALTA) 30 MG capsule Take 30 mg by mouth daily. (Total dose = 150 mg)    . DULoxetine (CYMBALTA) 60 MG capsule Take 120 mg by mouth every morning. (Total dose = 150 mg)    . gabapentin (NEURONTIN) 300 MG capsule TAKE 1 CAPSULE (300 MG TOTAL) BY MOUTH AT BEDTIME. FOR BACK PAIN. 90 capsule 1  . Melatonin 12 MG TABS Take by mouth at bedtime as needed.    . Multiple Vitamin (MULTIVITAMIN) tablet Take 1 tablet by mouth daily.    . NUPLAZID 34 MG CAPS Take 1 capsule by mouth daily.    . ondansetron (ZOFRAN) 8 MG tablet Take 8 mg by mouth every 8 (eight) hours as needed.    Katherine [name]. OVER THE COUNTER MEDICATION [employer] vitamin    . OVER THE COUNTER MEDICATION 2 tablets daily. [employer] Shield    . pantoprazole (PROTONIX) 40 MG tablet TAKE 1 TABLET BY MOUTH EVERY DAY 90 tablet 1  . simvastatin (ZOCOR) 20 MG tablet Take 1 tablet (20 mg total) by mouth at bedtime. 90 tablet 3  . SUMAtriptan (IMITREX) 100 MG tablet Take 100 mg by mouth as needed.    . topiramate (TOPAMAX) 50 MG tablet Take 50 mg by mouth daily.     . traMADol (ULTRAM) 50 MG tablet Take 1 tablet (50 mg total) by mouth every 6 (six) hours as needed. 30 tablet 0   No current facility-administered medications on file prior to visit.     Family Hx: The patient's family history includes Breast cancer (age of onset: 10074) in her sister; Cancer in her maternal grandfather and mother; Cancer (age of onset: 1467) in her brother; Diabetes in her brother, father, mother, and sister; Esophageal cancer in her brother; Heart disease in her brother, father, and mother; Heart failure in her brother and mother; Hypertension in her father and mother; Liver disease in her father. There is no history of Colon cancer, Stomach cancer, or Rectal cancer.  ROS:   Please see the history of present illness.    Review of Systems  Constitutional: Negative.   HENT: Negative.   Respiratory:  Negative.   Cardiovascular: Negative.   Gastrointestinal: Negative.   Musculoskeletal: Negative.   Neurological: Positive for dizziness.  Psychiatric/Behavioral: Negative.   All other systems reviewed and are negative.    Labs/Other Tests and Data Reviewed:    Recent Labs: 04/15/2020: ALT 6; BUN 19; Creatinine, Ser 0.80; Potassium 4.4; Sodium 135 05/19/2020: Hemoglobin 9.9; Platelets 363.0   Recent Lipid Panel Lab Results  Component Value Date/Time   CHOL 130 04/15/2020 12:41 PM   CHOL 123 06/22/2013 08:09 AM   TRIG 85.0 04/15/2020 12:41 PM   HDL 52.20 04/15/2020 12:41  PM   HDL 48 06/22/2013 08:09 AM   CHOLHDL 2 04/15/2020 12:41 PM   LDLCALC 60 04/15/2020 12:41 PM   LDLCALC 60 06/22/2013 08:09 AM   LDLDIRECT 92.0 05/10/2018 11:10 AM    Wt Readings from Last 3 Encounters:  07/22/20 189 lb (85.7 kg)  06/10/20 182 lb (82.6 kg)  04/15/20 179 lb (81.2 kg)     Exam:    BP 120/80 (BP Location: Left Arm, Patient Position: Sitting, Cuff Size: Large)   Pulse 96   Ht 5\' 2"  (1.575 m)   Wt 189 lb (85.7 kg)   SpO2 98%   BMI 34.57 kg/m   Constitutional:  oriented to person, place, and time. No distress.  HENT:  Head: Grossly normal Eyes:  no discharge. No scleral icterus.  Neck: No JVD, no carotid bruits  Cardiovascular: Regular rate and rhythm, no murmurs appreciated Pulmonary/Chest: Clear to auscultation bilaterally, no wheezes or rails Abdominal: Soft.  no distension.  no tenderness.  Musculoskeletal: Normal range of motion Neurological:  normal muscle tone. Coordination normal. No atrophy Skin: Skin warm and dry Psychiatric: normal affect, pleasant   ASSESSMENT & PLAN:    Problem List Items Addressed This Visit      Cardiology Problems   Hyperlipidemia   Relevant Medications   spironolactone (ALDACTONE) 25 MG tablet    Other Visit Diagnoses    Essential hypertension    -  Primary   Relevant Medications   spironolactone (ALDACTONE) 25 MG tablet   Chest pain,  unspecified type       Relevant Medications   spironolactone (ALDACTONE) 25 MG tablet   Dyspnea, unspecified type       Sleep-disordered breathing       Paresthesia of lower extremity       Tachycardia         Dizziness Rare episodes, more fatigue sx on higher coreg 12.5 twice daily Will decrease spironolactone down to 25 daily from 25 twice daily May need to decrease the dose of Coreg if she continues to have symptoms  Falls Denies any recent falls on new medication Dx with parkinsons, Followed by neurology Recommended regular exercise program  Tachycardia Improved symptoms on carvedilol,  Plan as above, may need to decrease the dose for orthostasis  Parkinson's Followed by neurology Reports dramatic improvement in symptoms on her current medications   Total encounter time more than 25 minutes  Greater than 50% was spent in counseling and coordination of care with the patient    Signed, , MD  [name] [address], [place], [place]

## 2020-07-22 ENCOUNTER — Ambulatory Visit (INDEPENDENT_AMBULATORY_CARE_PROVIDER_SITE_OTHER): Payer: Medicare Other | Admitting: Cardiovascular Disease

## 2020-07-22 ENCOUNTER — Encounter: Payer: Self-pay | Admitting: Cardiovascular Disease

## 2020-07-22 ENCOUNTER — Other Ambulatory Visit: Payer: Self-pay

## 2020-07-22 VITALS — BP 120/80 | HR 96 | Ht 62.0 in | Wt 189.0 lb

## 2020-07-22 DIAGNOSIS — I10 Essential (primary) hypertension: Secondary | ICD-10-CM

## 2020-07-22 DIAGNOSIS — E782 Mixed hyperlipidemia: Secondary | ICD-10-CM | POA: Diagnosis not present

## 2020-07-22 DIAGNOSIS — R079 Chest pain, unspecified: Secondary | ICD-10-CM

## 2020-07-22 DIAGNOSIS — R0600 Dyspnea, unspecified: Secondary | ICD-10-CM | POA: Diagnosis not present

## 2020-07-22 DIAGNOSIS — R000 Tachycardia, unspecified: Secondary | ICD-10-CM

## 2020-07-22 DIAGNOSIS — R202 Paresthesia of skin: Secondary | ICD-10-CM

## 2020-07-22 DIAGNOSIS — G4730 Sleep apnea, unspecified: Secondary | ICD-10-CM

## 2020-07-22 MED ORDER — SPIRONOLACTONE 25 MG PO TABS: 25.0000 mg | ORAL_TABLET | Freq: Every day | ORAL | 3 refills | Status: DC

## 2020-07-22 MED ORDER — SPIRONOLACTONE 25 MG PO TABS
25.0000 mg | ORAL_TABLET | Freq: Every day | ORAL | 3 refills | Status: DC
Start: 2020-07-22 — End: 2020-07-22

## 2020-07-22 NOTE — Patient Instructions (Addendum)
Medication Instructions:  Please take spironolactone 25mg  once a dau  If you blood pressure continues to drop (low readings) and if you are getting dizzy or tired due to the low blood pressure, they send a MyChart message or call the office so we can reduce the Coreg   Lab work: No new labs needed   If you have labs (blood work) drawn today and your tests are completely normal, you will receive your results only by: MyChart Message (if you have MyChart) OR . A paper copy in the mail If you have any lab test that is abnormal or we need to change your treatment, we will call you to review the results.   Testing/Procedures: No new testing needed   Follow-Up: At [facility], you and your health needs are our priority.  As part of our continuing mission to provide you with exceptional heart care, we have created designated Provider Care Teams.  These Care Teams include your primary Cardiologist (physician) and Advanced Practice Providers (APPs -  Physician Assistants and Nurse Practitioners) who all work together to provide you with the care you need, when you need it.  . You will need a follow up appointment in 6 months, app ok  . Providers on your designated Care Team:   . [facility], NP . Nicolasa Ducking, PA-C . Eula Listen, PA-C  Any Other Special Instructions Will Be Listed Below (If Applicable).  COVID-19 Vaccine Information can be found at: Marisue Ivan For questions related to vaccine distribution or appointments, please email [email or web address] or call [phone].

## 2020-07-23 ENCOUNTER — Ambulatory Visit: Payer: Medicare Other | Admitting: Primary Care

## 2020-08-01 ENCOUNTER — Ambulatory Visit: Payer: Medicare Other | Admitting: Primary Care

## 2020-08-04 DIAGNOSIS — F3131 Bipolar disorder, current episode depressed, mild: Secondary | ICD-10-CM | POA: Diagnosis not present

## 2020-08-04 DIAGNOSIS — G20 Parkinson's disease: Secondary | ICD-10-CM | POA: Diagnosis not present

## 2020-08-07 ENCOUNTER — Telehealth: Payer: Self-pay | Admitting: Primary Care

## 2020-08-07 NOTE — Telephone Encounter (Signed)
Please notify patient that she does not need to fast for her appointment.

## 2020-08-07 NOTE — Telephone Encounter (Signed)
Patient called in wanting to know if she needs to come in before her appt to do a fasting or non fasting lab for her iron. Please advise patient Katherine [name]

## 2020-08-08 ENCOUNTER — Ambulatory Visit: Payer: Medicare Other | Admitting: Primary Care

## 2020-08-08 NOTE — Telephone Encounter (Signed)
Called spoke to husband (on [place]) let know we will do any labs needed at appointment and does not need to fast.

## 2020-08-14 ENCOUNTER — Encounter: Payer: Self-pay | Admitting: Primary Care

## 2020-08-14 ENCOUNTER — Ambulatory Visit (INDEPENDENT_AMBULATORY_CARE_PROVIDER_SITE_OTHER): Payer: Medicare Other | Admitting: Primary Care

## 2020-08-14 ENCOUNTER — Other Ambulatory Visit: Payer: Self-pay

## 2020-08-14 DIAGNOSIS — M79674 Pain in right toe(s): Secondary | ICD-10-CM | POA: Diagnosis not present

## 2020-08-14 DIAGNOSIS — K219 Gastro-esophageal reflux disease without esophagitis: Secondary | ICD-10-CM

## 2020-08-14 DIAGNOSIS — M79675 Pain in left toe(s): Secondary | ICD-10-CM | POA: Diagnosis not present

## 2020-08-14 DIAGNOSIS — E782 Mixed hyperlipidemia: Secondary | ICD-10-CM | POA: Diagnosis not present

## 2020-08-14 DIAGNOSIS — D649 Anemia, unspecified: Secondary | ICD-10-CM

## 2020-08-14 DIAGNOSIS — M79676 Pain in unspecified toe(s): Secondary | ICD-10-CM | POA: Insufficient documentation

## 2020-08-14 LAB — IBC + FERRITIN
Ferritin: 23.9 ng/mL (ref 10.0–291.0)
Iron: 70 ug/dL (ref 42–145)
Saturation Ratios: 18.6 % — ABNORMAL LOW (ref 20.0–50.0)
Transferrin: 269 mg/dL (ref 212.0–360.0)

## 2020-08-14 LAB — CBC
HCT: 43.8 % (ref 36.0–46.0)
Hemoglobin: 14.1 g/dL (ref 12.0–15.0)
MCHC: 32.2 g/dL (ref 30.0–36.0)
MCV: 89.2 fl (ref 78.0–100.0)
Platelets: 265 10*3/uL (ref 150.0–400.0)
RBC: 4.91 Mil/uL (ref 3.87–5.11)
RDW: 21.1 % — ABNORMAL HIGH (ref 11.5–15.5)
WBC: 9.4 10*3/uL (ref 4.0–10.5)

## 2020-08-14 LAB — BASIC METABOLIC PANEL
BUN: 21 mg/dL (ref 6–23)
CO2: 25 mEq/L (ref 19–32)
Calcium: 9.4 mg/dL (ref 8.4–10.5)
Chloride: 110 mEq/L (ref 96–112)
Creatinine, Ser: 0.87 mg/dL (ref 0.40–1.20)
GFR: 68.26 mL/min (ref >60.00)
Glucose, Bld: 94 mg/dL (ref 70–99)
Potassium: 4 mEq/L (ref 3.5–5.1)
Sodium: 142 mEq/L (ref 135–145)

## 2020-08-14 LAB — URIC ACID: Uric Acid, Serum: 5 mg/dL (ref 2.4–7.0)

## 2020-08-14 NOTE — Progress Notes (Signed)
Subjective:    Patient ID: Katherine [name], female    DOB: 11-19-1950, 70 y.o.   MRN: [id number]  HPI  This visit occurred during the SARS-CoV-2 public health emergency.  Safety protocols were in place, including screening questions prior to the visit, additional usage of staff PPE, and extensive cleaning of exam room while observing appropriate contact time as indicated for disinfecting solutions.   Katherine [name] is a 70 year old female with a history of Lewy Body Dementia, Parkinson's Disease, OSA, Chronic migraines, hypertension, prediabetes, anemia, chronic fatigue, urinary incontinence who presents today to discuss several issues.  1) GERD: Currently managed on pantoprazole 40 mg daily. She continues to notice esophageal burning, reflux into the throat, and vomiting. Symptoms will sometimes wake her from sleep. This occurs with milk, eating later at bedtime, anything with dairy. She typically waits two hours after bed before going to bed.   She does not eat spicy foods; she limits acidic food, caffeine, carbonated beverage. She has not tried taking pantoprazole at night.  2) Toe Pain: Occurring to all toes bilaterally that bother her late at night and early in the morning. She describes her pain as "cramping" that "hurt so bad that I cannot get under the covers". She denies actual contraction of the toes, color changes. This began about 3 weeks ago.   She denies recent falls, she drinks plenty of water. She takes her simvastatin 20 mg and thinks that her cardiologist changed her simvastatin to 20 mg twice daily, she has been doing this for the last three weeks.   3) Iron Deficiency Anemia: Chronic. She's undergone 2 iron infusions, one in 05/16/20 and another on 05/27/20. She is due for repeat labs today. She is feeling more tired than before she had the infusions. She did feel better initially after the infusions.   She denies vaginal or rectal bleeding, dark tarry stools.   BP  Readings from Last 3 Encounters:  08/14/20 104/64  07/22/20 120/80  06/10/20 120/80     Review of Systems  Constitutional: Positive for fatigue.  Respiratory: Negative for shortness of breath.   Cardiovascular: Negative for chest pain.  Gastrointestinal: Negative for blood in stool.       GERD, see HPI  Genitourinary: Negative for vaginal bleeding.  Musculoskeletal: Positive for arthralgias.  Neurological: Positive for dizziness.       Past Medical History:  Diagnosis Date  . Anemia 2018  . Anxiety   . Arthritis   . Borderline diabetes   . Chest pain    a. 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 Chicken pox    age 39  . Chronic back pain    Compressed discs. [place]  . Depression   . Dyspnea on exertion    a. 03/2009 Echo: EF 65%, no rwma, triv TR.  Marland Kitchen GERD (gastroesophageal reflux disease)   . High cholesterol   . Hypertension   . Hypokalemia   . Migraine headache   . Obesity   . Parkinson's disease ([place])   . Pre-diabetes    okay now      Social History   Socioeconomic History  . Marital status: Married    Spouse name: Not on file  . Number of children: Not on file  . Years of education: Not on file  . Highest education level: Not on file  Occupational History  . Occupation: Pharmacist, hospital  Employer: [employer]  Tobacco Use  . Smoking status: Never Smoker  . Smokeless tobacco: Never Used  Vaping Use  . Vaping Use: Never used  Substance and Sexual Activity  . Alcohol use: No  . Drug use: No  . Sexual activity: Yes    Partners: Male    Birth control/protection: Post-menopausal  Other Topics Concern  . Not on file  Social History Narrative   Married.   One son is 35, lives in [place].   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   Enjoys substitute teaching, gardening, cooking.   Social Determinants of Health   Financial Resource Strain: Not on file   Food Insecurity: Not on file  Transportation Needs: Not on file  Physical Activity: Not on file  Stress: Not on file  Social Connections: Not on file  Intimate Partner Violence: Not on file    Past Surgical History:  Procedure Laterality Date  . BLADDER SUSPENSION  2005  . CARDIAC CATHETERIZATION    . CESAREAN SECTION    . CLOSED REDUCTION NASAL FRACTURE N/A 01/11/2020   Procedure: CLOSED REDUCTION NASAL FRACTURE;  Surgeon: Jalessa Gust, MD;  Location: [place];  Service: ENT;  Laterality: N/A;  . ENDOMETRIAL ABLATION  2007  . FOOT SURGERY     bilateral bunions  . LUMBAR LAMINECTOMY/DECOMPRESSION MICRODISCECTOMY Bilateral 01/12/2016   Procedure: Bilateral L4-5 Laminotomy/Foraminotomy;  Surgeon: Newman Pies, MD;  Location: [facility];  Service: Neurosurgery;  Laterality: Bilateral;  Bilateral L4-5 Laminotomy/Foraminotomy  . TONSILLECTOMY  1971    Family History  Problem Relation Age of Onset  . Heart disease Mother   . Hypertension Mother   . Heart failure Mother        CHF  . Diabetes Mother   . Cancer Mother        CERVICAL CANCER  . Heart disease Father   . Diabetes Father   . Liver disease Father   . Hypertension Father   . Diabetes Sister   . Breast cancer Sister 23  . Heart disease Brother   . Heart failure Brother        CHF  . Diabetes Brother   . Cancer Brother 2       THROAT AND NECK  . Esophageal cancer Brother   . Cancer Maternal Grandfather   . Colon cancer Neg Hx   . Stomach cancer Neg Hx   . Rectal cancer Neg Hx     Allergies  Allergen Reactions  . Dilaudid [Hydromorphone]     hallucinations  . Codeine Anxiety  . Darvon Anxiety  . Hydrocodone Anxiety  . Meperidine Anxiety  . Oxycodone Anxiety  . Tramadol Anxiety  . Victoza [Liraglutide] Nausea Only and Other (See Comments)    And weakness    Current Outpatient Medications on File Prior to Visit  Medication Sig Dispense Refill  . acetaminophen (TYLENOL) 325 MG  tablet Take 650 mg by mouth every 6 (six) hours as needed.    . carvedilol (COREG) 12.5 MG tablet Take 1 tablet (12.5 mg total) by mouth 2 (two) times daily with a meal. Please keep your upcoming appointment for refills. 180 tablet 1  . diazepam (VALIUM) 10 MG tablet Take 10 mg by mouth at bedtime.  2  . DULoxetine (CYMBALTA) 30 MG capsule Take 30 mg by mouth daily. (Total dose = 150 mg)    . DULoxetine (CYMBALTA) 60 MG capsule Take 120 mg by mouth every morning. (Total dose = 150 mg)    .  gabapentin (NEURONTIN) 300 MG capsule TAKE 1 CAPSULE (300 MG TOTAL) BY MOUTH AT BEDTIME. FOR BACK PAIN. 90 capsule 1  . Multiple Vitamin (MULTIVITAMIN) tablet Take 1 tablet by mouth daily.    . NUPLAZID 34 MG CAPS Take 1 capsule by mouth daily.    Marland Kitchen OVER THE COUNTER MEDICATION [employer] vitamin    . OVER THE COUNTER MEDICATION 2 tablets daily. [employer]    . pantoprazole (PROTONIX) 40 MG tablet TAKE 1 TABLET BY MOUTH EVERY DAY 90 tablet 1  . spironolactone (ALDACTONE) 25 MG tablet Take 1 tablet (25 mg total) by mouth daily. 90 tablet 3  . SUMAtriptan (IMITREX) 100 MG tablet Take 100 mg by mouth as needed.    . topiramate (TOPAMAX) 50 MG tablet Take 50 mg by mouth daily.     . simvastatin (ZOCOR) 20 MG tablet Take 1 tablet (20 mg total) by mouth at bedtime. 90 tablet 3   No current facility-administered medications on file prior to visit.    BP 104/64 (BP Location: Left Arm)   Pulse 83   Temp 97.6 F (36.4 C)   Wt 186 lb (84.4 kg)   SpO2 98%   BMI 34.02 kg/m    Objective:   Physical Exam Constitutional:      Appearance: She is well-nourished.  Cardiovascular:     Rate and Rhythm: Normal rate and regular rhythm.  Pulmonary:     Effort: Pulmonary effort is normal.     Breath sounds: Normal breath sounds.  Musculoskeletal:     Cervical back: Neck supple.     Comments: Normal ROM of toes bilaterally    Skin:    General: Skin is warm and dry.     Findings: No erythema.     Comments: No  erythema or swelling to the toes  Psychiatric:        Mood and Affect: Mood and affect normal.            Assessment & Plan:

## 2020-08-14 NOTE — Assessment & Plan Note (Signed)
Chronic, iron transfusion on 05/16/20 and 05/27/20. Repeat labs pending.

## 2020-08-14 NOTE — Patient Instructions (Signed)
Stop by the lab prior to leaving today. I will notify you of your results once received.   Please have Katherine [name] check to see if you are taking simvastatin 20 mg once or twice daily.   Move your pantoprazole 40 mg to bedtime for heartburn.   Please update me regarding your heartburn symptoms in 2 weeks.  It was a pleasure to see you today!

## 2020-08-14 NOTE — Assessment & Plan Note (Signed)
Compliant to simvastatin 20 mg, it sounds like she may have been taking this twice daily for the last three weeks.  She is a poor historian so not sure if this is accurate. I will touch base with her husband.   If she is taking simvastatin 20 mg BID then it could be contributing to the cramping in her toes.

## 2020-08-14 NOTE — Assessment & Plan Note (Signed)
Uncontrolled, doesn't appear to be engaging in triggers. Discussed to always remain upright for 2 hours before going to bed.  Will have her move pantoprazole 40 mg to bedtime. If no improvement then add in famotidine 20 mg at opposite time of the day.   She will update.

## 2020-08-14 NOTE — Assessment & Plan Note (Signed)
Acute bilaterally for the last three weeks.  She endorses taking simvastatin 20 mg BID for the last 3 weeks, unclear if this is the case as she is a poor historian.   Will speak with her husband to see if she has been taking simvastatin 20 mg BID, if so then recommend to reduce to 20 mg daily given cramping.  Will also check BMP, uric acid, CBC.

## 2020-08-15 DIAGNOSIS — Z87440 Personal history of urinary (tract) infections: Secondary | ICD-10-CM

## 2020-08-15 DIAGNOSIS — R5383 Other fatigue: Secondary | ICD-10-CM

## 2020-08-15 DIAGNOSIS — E538 Deficiency of other specified B group vitamins: Secondary | ICD-10-CM

## 2020-08-15 DIAGNOSIS — E2839 Other primary ovarian failure: Secondary | ICD-10-CM

## 2020-08-18 ENCOUNTER — Other Ambulatory Visit: Payer: Self-pay | Admitting: Primary Care

## 2020-08-18 DIAGNOSIS — Z1231 Encounter for screening mammogram for malignant neoplasm of breast: Secondary | ICD-10-CM

## 2020-08-23 ENCOUNTER — Other Ambulatory Visit: Payer: Self-pay | Admitting: Cardiovascular Disease

## 2020-09-02 DIAGNOSIS — G3183 Dementia with Lewy bodies: Secondary | ICD-10-CM | POA: Diagnosis not present

## 2020-09-02 DIAGNOSIS — G4752 REM sleep behavior disorder: Secondary | ICD-10-CM | POA: Diagnosis not present

## 2020-09-02 DIAGNOSIS — R441 Visual hallucinations: Secondary | ICD-10-CM | POA: Diagnosis not present

## 2020-09-02 DIAGNOSIS — G20 Parkinson's disease: Secondary | ICD-10-CM | POA: Diagnosis not present

## 2020-09-02 DIAGNOSIS — F22 Delusional disorders: Secondary | ICD-10-CM | POA: Diagnosis not present

## 2020-09-02 DIAGNOSIS — R2689 Other abnormalities of gait and mobility: Secondary | ICD-10-CM | POA: Diagnosis not present

## 2020-09-02 DIAGNOSIS — F0281 Dementia in other diseases classified elsewhere with behavioral disturbance: Secondary | ICD-10-CM | POA: Diagnosis not present

## 2020-09-02 DIAGNOSIS — R278 Other lack of coordination: Secondary | ICD-10-CM | POA: Diagnosis not present

## 2020-09-10 IMAGING — MG DIGITAL DIAGNOSTIC BILATERAL MAMMOGRAM WITH TOMO AND CAD
8 series · 8 of 24 positions shown · non-contrast
Comparison: Previous exam(s).

ACR Breast Density Category a: The breast tissue is almost entirely
fatty.

CLINICAL DATA: 67-year-old female presenting for delayed follow-up
a probably benign area presumed to be fat necrosis.

EXAM:
DIGITAL DIAGNOSTIC BILATERAL MAMMOGRAM WITH CAD AND TOMO

[R MLO synth-2D]
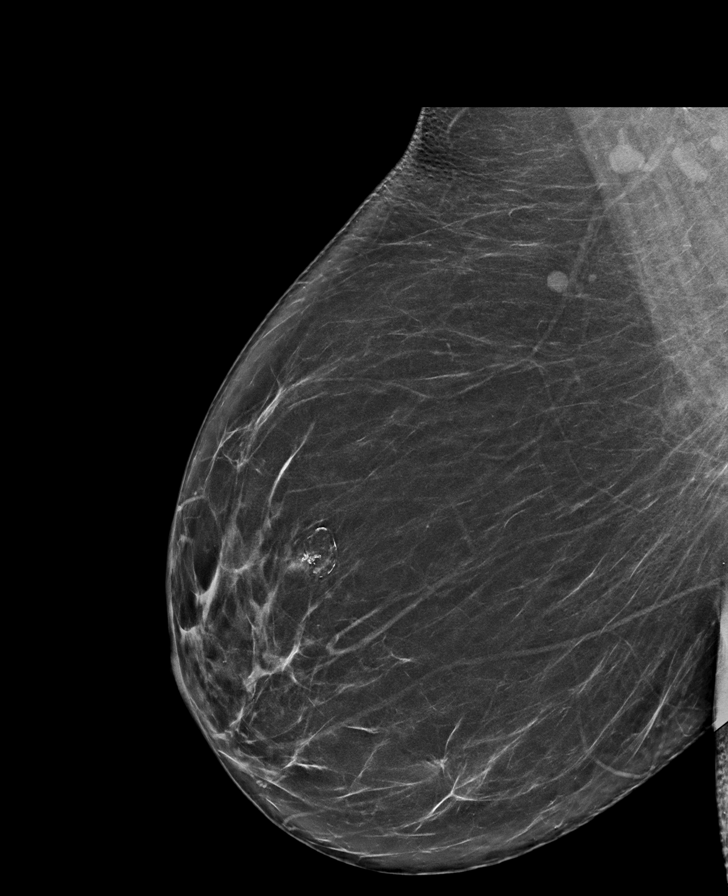

[R CC synth-2D]
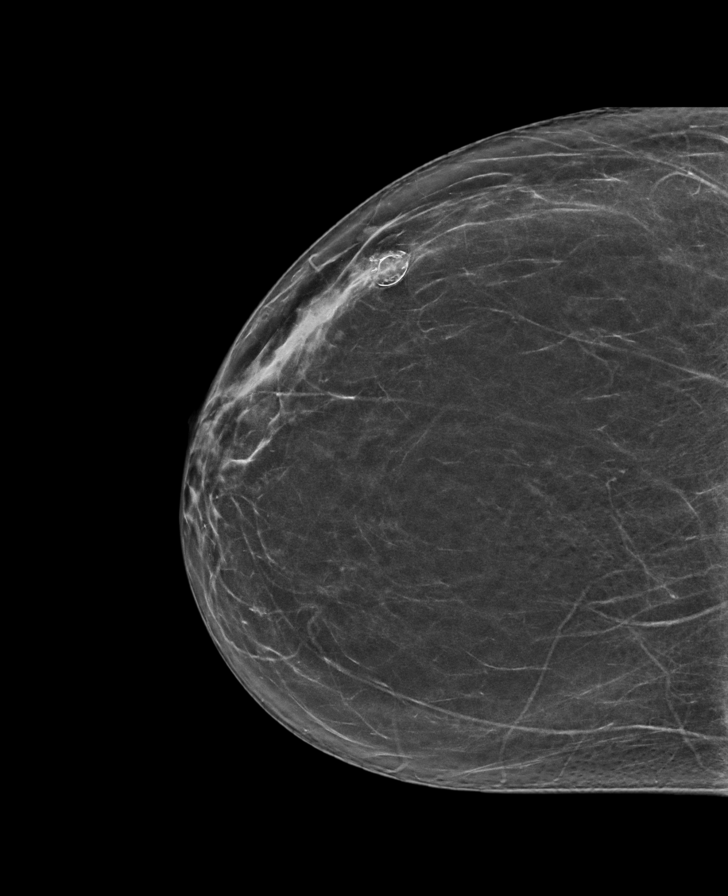

[L CC synth-2D]
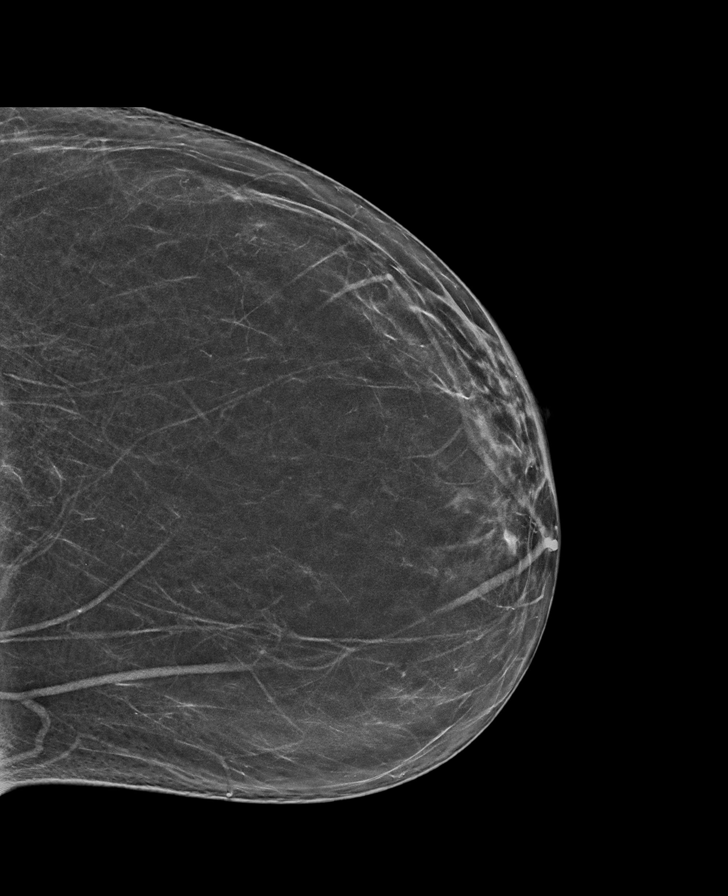

[L MLO synth-2D]
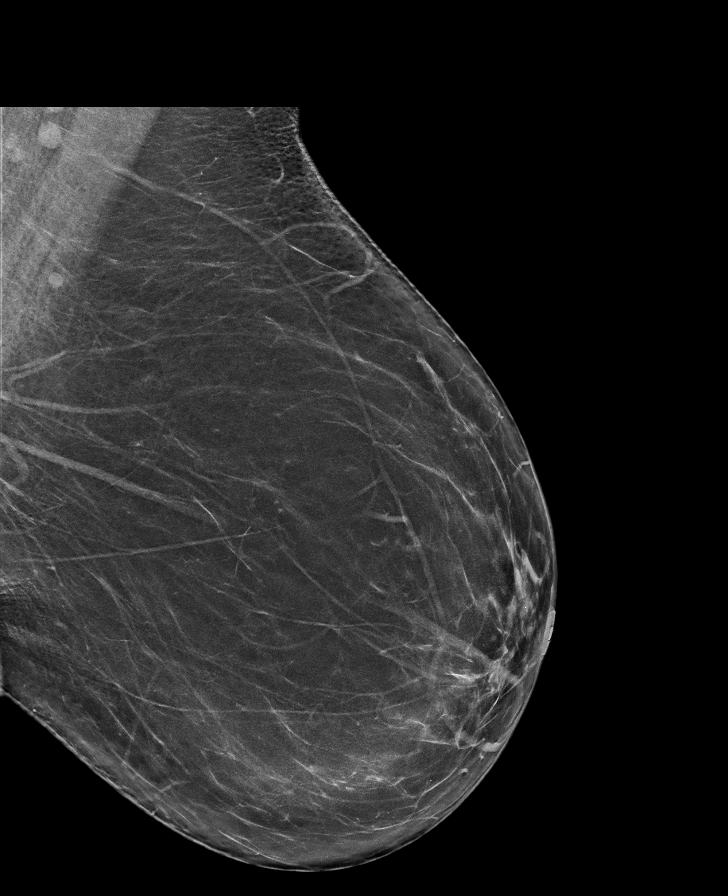

[R MLO tomo · tomo slice 43/85.0]
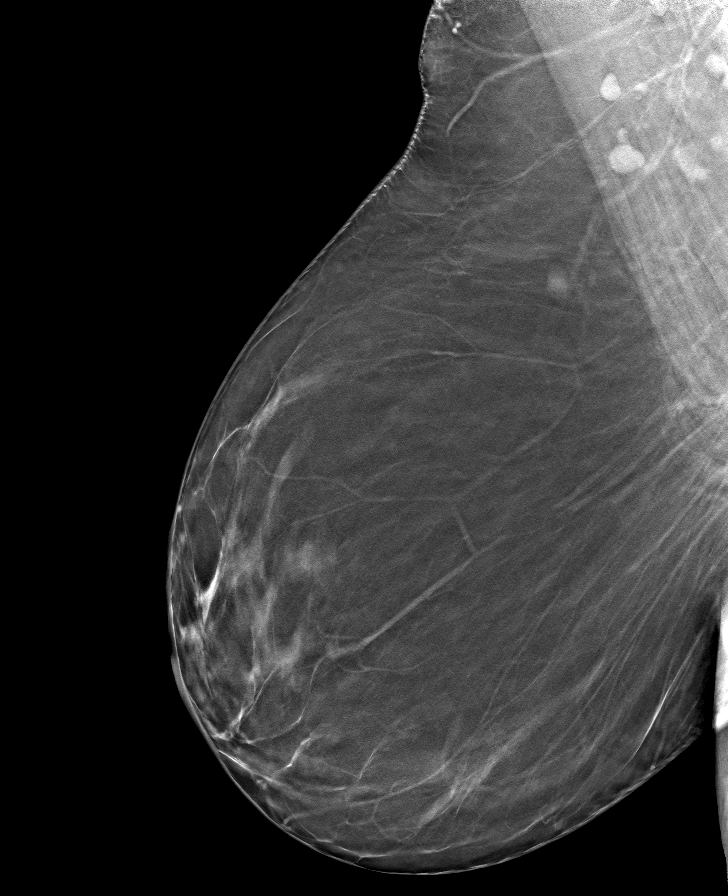

[L MLO tomo · tomo slice 42/83.0]
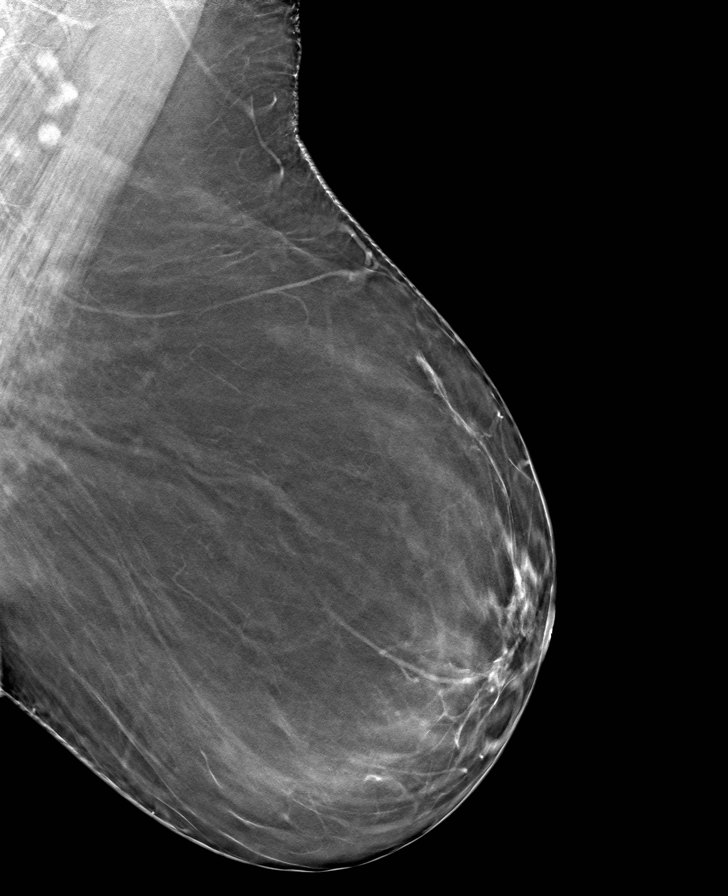

[R CC tomo · tomo slice 37/72.0]
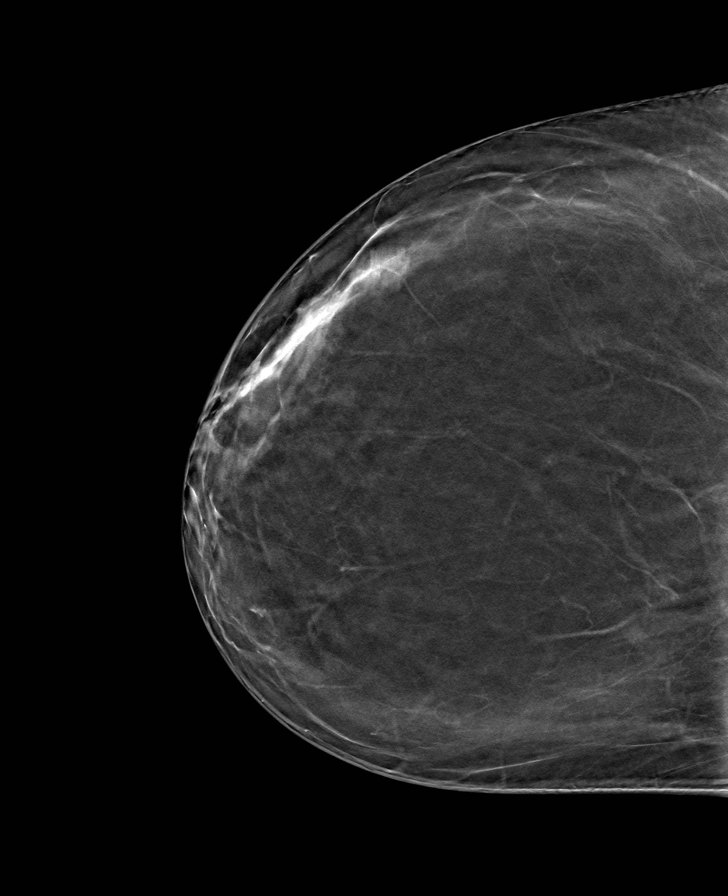

[L CC tomo · tomo slice 35/70.0]
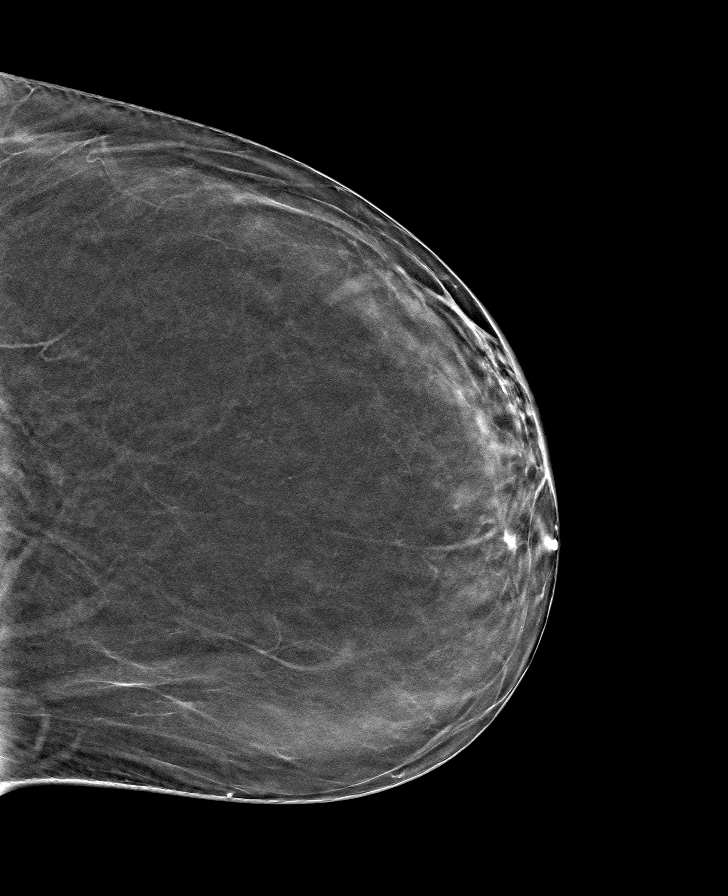

[8 of 24 positions shown; findings below may reference images not displayed]

FINDINGS: The previously palpable mass in the lateral right breast has
significantly decreased in size and demonstrates rim calcifications
consistent with benign fat necrosis. No suspicious calcifications,
masses or areas of distortion are seen in the bilateral breasts.

Mammographic images were processed with CAD.
IMPRESSION: 1.  The mass in the lateral right breast is benign fat necrosis.

2.  No mammographic evidence of malignancy in the bilateral breasts.

RECOMMENDATION:
Screening mammogram in one year.(Code:S7-1-VHB)

I have discussed the findings and recommendations with the patient.
Results were also provided in writing at the conclusion of the
visit. If applicable, a reminder letter will be sent to the patient
regarding the next appointment.

BI-RADS CATEGORY  2: Benign.

## 2020-09-16 DIAGNOSIS — H524 Presbyopia: Secondary | ICD-10-CM | POA: Diagnosis not present

## 2020-09-16 DIAGNOSIS — H5213 Myopia, bilateral: Secondary | ICD-10-CM | POA: Diagnosis not present

## 2020-09-16 DIAGNOSIS — H43812 Vitreous degeneration, left eye: Secondary | ICD-10-CM | POA: Diagnosis not present

## 2020-09-16 DIAGNOSIS — H52223 Regular astigmatism, bilateral: Secondary | ICD-10-CM | POA: Diagnosis not present

## 2020-09-16 DIAGNOSIS — H04123 Dry eye syndrome of bilateral lacrimal glands: Secondary | ICD-10-CM | POA: Diagnosis not present

## 2020-09-16 DIAGNOSIS — H2513 Age-related nuclear cataract, bilateral: Secondary | ICD-10-CM | POA: Diagnosis not present

## 2020-09-16 DIAGNOSIS — H35033 Hypertensive retinopathy, bilateral: Secondary | ICD-10-CM | POA: Diagnosis not present

## 2020-09-22 ENCOUNTER — Other Ambulatory Visit: Payer: Self-pay | Admitting: Primary Care

## 2020-09-22 DIAGNOSIS — K219 Gastro-esophageal reflux disease without esophagitis: Secondary | ICD-10-CM

## 2020-09-23 ENCOUNTER — Other Ambulatory Visit: Payer: Self-pay | Admitting: Cardiovascular Disease

## 2020-09-23 ENCOUNTER — Other Ambulatory Visit: Payer: Self-pay

## 2020-09-23 DIAGNOSIS — I10 Essential (primary) hypertension: Secondary | ICD-10-CM

## 2020-09-23 DIAGNOSIS — R079 Chest pain, unspecified: Secondary | ICD-10-CM

## 2020-09-24 ENCOUNTER — Other Ambulatory Visit: Payer: Self-pay

## 2020-09-24 DIAGNOSIS — I10 Essential (primary) hypertension: Secondary | ICD-10-CM

## 2020-09-24 DIAGNOSIS — R079 Chest pain, unspecified: Secondary | ICD-10-CM

## 2020-09-24 NOTE — Telephone Encounter (Signed)
*  STAT* If patient is at the pharmacy, call can be transferred to refill team.   1. Which medications need to be refilled? (please list name of each medication and dose if known) sPIRONOLACTONE  2. Which pharmacy/location (including street and city if local pharmacy) is medication to be sent to? [facility]  3. Do they need a 30 day or 90 day supply? [place]

## 2020-09-25 ENCOUNTER — Other Ambulatory Visit: Payer: Self-pay

## 2020-09-25 ENCOUNTER — Telehealth: Payer: Self-pay

## 2020-09-25 ENCOUNTER — Ambulatory Visit: Payer: Medicare Other | Admitting: Internal Medicine

## 2020-09-25 NOTE — Telephone Encounter (Signed)
[facility] - Client TELEPHONE ADVICE RECORD [facility] Patient Name: Katherine [name] Gender: Female DOB: 1950-10-15 Age: 70 Y 11 M 13 D Return Phone Number: [phone] (Primary), [phone] (Secondary) Address[place] [address] Client [facility] - Client Client Site [place] - Day Physician Alma Friendly - NP Contact Type Call Who Is Calling Patient / Member / Family / Caregiver Call Type Triage / Clinical Relationship To Patient Self Return Phone Number [phone] (Primary) Chief Complaint Urinary decreased output (> THREE MONTHS and no output in 8 hours) Reason for Call Symptomatic / Request for Emmet states she is experiencing trouble urinating. She states she urinates very little, but feels as if her bladder is full. Office does not have available appt until Monday or Tuesday. [place] Not Listed UCC Translation No Nurse Assessment Nurse: Rock Nephew, RN, Juliann Pulse Date/Time (Eastern Time): 09/25/2020 1:47:33 PM Confirm and document reason for call. If symptomatic, describe symptoms. ---Caller states she is experiencing trouble urinating. She states she urinates very little, but feels as if her bladder is full. Office does not have available appt until Monday or Tuesday. Does the patient have any new or worsening symptoms? ---Yes Will a triage be completed? ---Yes Related visit to physician within the last 2 weeks? ---No Does the PT have any chronic conditions? (i.e. diabetes, asthma, this includes High risk factors for pregnancy, etc.) ---Yes List chronic conditions. ---Parkinson's Disease and HTN Is this a behavioral health or substance abuse call? ---No Guidelines Guideline Title Affirmed Question Affirmed Notes Nurse Date/Time Eilene Ghazi Time) Urinary Symptoms Urinating more frequently than usual (i.e., frequency) Rock Nephew, RN, Juliann Pulse  09/25/2020 1:49:24 PM Disp. Time Eilene Ghazi Time) Disposition Final User 09/25/2020 1:52:50 PM See PCP within 24 Hours Yes Rock Nephew, RN, Juliann Pulse PLEASE NOTE: All timestamps contained within this report are represented as [place] Standard Time. CONFIDENTIALTY NOTICE: This fax transmission is intended only for the addressee. It contains information that is legally privileged, confidential or otherwise protected from use or disclosure. If you are not the intended recipient, you are strictly prohibited from reviewing, disclosing, copying using or disseminating any of this information or taking any action in reliance on or regarding this information. If you have received this fax in error, please notify us immediately by telephone so that we can arrange for its return to [place]. Phone: [phone], Toll-Free: [phone], Fax: [phone] Page: 2 of 2 Call Id: [id number] [place] Disagree/Comply Comply Caller Understands No PreDisposition Call Doctor Care Advice Given Per Guideline SEE PCP WITHIN 24 HOURS: * IF OFFICE WILL BE OPEN: You need to be examined within the next 24 hours. Call your doctor (or NP/PA) when the office opens and make an appointment. * You become worse CALL BACK IF: * Fever occurs * Unable to urinate and bladder feels full CARE ADVICE given per Urinary Symptoms (Adult) guideline. Referrals GO TO FACILITY OTHER - SPECIFY

## 2020-09-25 NOTE — Telephone Encounter (Signed)
Pt no showed today.

## 2020-09-25 NOTE — Telephone Encounter (Signed)
Per appt notes pt already has appt scheduled with Dr Olivia Mackie McLean-Scocuzza 09/25/20 at 2:30 pm. Sending note to Dr McLean-Scocuzza.

## 2020-09-26 ENCOUNTER — Encounter: Payer: Self-pay | Admitting: Emergency Medicine

## 2020-09-26 ENCOUNTER — Emergency Department
Admission: EM | Admit: 2020-09-26 | Discharge: 2020-09-26 | Disposition: A | Discharge status: Home / Self Care | Payer: Medicare Other | Attending: Emergency Medicine | Admitting: Emergency Medicine

## 2020-09-26 ENCOUNTER — Other Ambulatory Visit: Payer: Self-pay

## 2020-09-26 DIAGNOSIS — G3183 Dementia with Lewy bodies: Secondary | ICD-10-CM | POA: Diagnosis not present

## 2020-09-26 DIAGNOSIS — I10 Essential (primary) hypertension: Secondary | ICD-10-CM | POA: Diagnosis not present

## 2020-09-26 DIAGNOSIS — N3001 Acute cystitis with hematuria: Secondary | ICD-10-CM | POA: Diagnosis not present

## 2020-09-26 DIAGNOSIS — R339 Retention of urine, unspecified: Secondary | ICD-10-CM | POA: Diagnosis present

## 2020-09-26 LAB — BASIC METABOLIC PANEL
Anion gap: 9 (ref 5–15)
BUN: 15 mg/dL (ref 8–23)
CO2: 21 mmol/L — ABNORMAL LOW (ref 22–32)
Calcium: 9 mg/dL (ref 8.9–10.3)
Chloride: 111 mmol/L (ref 98–111)
Creatinine, Ser: 0.75 mg/dL (ref 0.44–1.00)
GFR, Estimated: >60 mL/min (ref >60)
Glucose, Bld: 114 mg/dL — ABNORMAL HIGH (ref 70–99)
Potassium: 3.4 mmol/L — ABNORMAL LOW (ref 3.5–5.1)
Sodium: 141 mmol/L (ref 135–145)

## 2020-09-26 LAB — URINALYSIS, COMPLETE (UACMP) WITH MICROSCOPIC
Bilirubin Urine: NEGATIVE
Glucose, UA: NEGATIVE mg/dL
Ketones, ur: NEGATIVE mg/dL
Nitrite: NEGATIVE
Protein, ur: 30 mg/dL — AB
Specific Gravity, Urine: 1.015 (ref 1.005–1.030)
WBC, UA: >50 WBC/hpf — ABNORMAL HIGH (ref 0–5)
pH: 5 (ref 5.0–8.0)

## 2020-09-26 MED ORDER — CEPHALEXIN 500 MG PO CAPS
500.0000 mg | ORAL_CAPSULE | Freq: Four times a day (QID) | ORAL | 0 refills | Status: AC
Start: 2020-09-26 — End: 2020-10-03

## 2020-09-26 NOTE — ED Triage Notes (Signed)
Patient to ER for c/o possible urinary retention. Patient states she has been unable to urinate since yesterday. Denies any dysuria prior to that. Patient has had issues with the same for last few months. Denies being catheterized during that time period.

## 2020-09-26 NOTE — ED Notes (Signed)
Pt ambulatory with husbands assistance to car. NAD noted and pt refused a wheelchair when offered. No questions reported at time of discharge.

## 2020-09-26 NOTE — ED Provider Notes (Signed)
[facility] Emergency Department Provider Note  ____________________________________________   Event Date/Time   First MD Initiated Contact with Patient 09/26/20 [phone]     (approximate)  I have reviewed the triage vital signs and the nursing notes.   HISTORY  Chief Complaint Urinary Retention   HPI Katherine [name] is a 70 y.o. female with past medical history of anemia, arthritis, depression, GERD, Parkinson's disease and prediabetes who presents accompanied by husband for assessment of concern for some urinary retention.  Patient states she has been struggling this for several months and will often have to wait 5 to 10 minutes to make a small amount of urine and will struggle to initiate her urinary stream.  She states that she was able to be a small amount emergency room prior to being seen by this examiner but otherwise was not able to go last night and thinks it was probably closer to yesterday morning when she last peed.  She endorses some suprapubic fullness but denies any upper abdominal pain, back pain, nausea, vomiting or diarrhea.  She does endorse some burning with urination at the sink started yesterday.  Denies any blood in her urine.  No chest pain, cough, fevers, chills, headache or earache, sore throat or any other acute sick symptoms.         Past Medical History:  Diagnosis Date  . Anemia 2018  . Anxiety   . Arthritis   . Borderline diabetes   . Chest pain    a. 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 Chicken pox    age 70  . Chronic back pain    Compressed discs. [place]  . Depression   . Dyspnea on exertion    a. 03/2009 Echo: EF 65%, no rwma, triv TR.  Marland Kitchen GERD (gastroesophageal reflux disease)   . High cholesterol   . Hypertension   . Hypokalemia   . Migraine headache   . Obesity   . Parkinson's disease ([place])   .  Pre-diabetes    okay now     Patient Active Problem List   Diagnosis Date Noted  . Toe pain 08/14/2020  . Lewy body dementia ([place]) 04/15/2020  . Urinary incontinence 04/15/2020  . Dysuria 02/13/2020  . Cough 02/06/2020  . Parkinson's disease ([place]) 07/11/2019  . Leukocytosis 07/11/2019  . Rash and nonspecific skin eruption 02/14/2019  . Dizziness 12/14/2018  . Osteopenia 07/07/2018  . Gastroesophageal reflux disease 03/13/2018  . Chronic migraine without aura without status migrainosus, not intractable 03/13/2018  . Constipation 02/15/2017  . Spondylolisthesis of lumbar region 07/15/2016  . Vitamin D deficiency 03/11/2016  . Lumbar stenosis with neurogenic claudication 01/12/2016  . Notalgia 01/08/2016  . Restless leg syndrome 03/13/2015  . Preventative health care 12/10/2014  . Other fatigue 11/04/2014  . Anemia 11/04/2014  . Depression 10/31/2014  . Prediabetes 10/31/2014  . Obstructive sleep apnea 05/01/2009  . Hyperlipidemia 09/04/2008  . Obesity 09/04/2008  . HYPERTENSION, BENIGN 09/04/2008    Past Surgical History:  Procedure Laterality Date  . BLADDER SUSPENSION  2005  . CARDIAC CATHETERIZATION    . CESAREAN SECTION    . CLOSED REDUCTION NASAL FRACTURE N/A 01/11/2020   Procedure: CLOSED REDUCTION NASAL FRACTURE;  Surgeon: Kenitha Gust, MD;  Location: [place];  Service: ENT;  Laterality: N/A;  . ENDOMETRIAL ABLATION  2007  . FOOT SURGERY     bilateral bunions  .  LUMBAR LAMINECTOMY/DECOMPRESSION MICRODISCECTOMY Bilateral 01/12/2016   Procedure: Bilateral L4-5 Laminotomy/Foraminotomy;  Surgeon: Newman Pies, MD;  Location: [facility];  Service: Neurosurgery;  Laterality: Bilateral;  Bilateral L4-5 Laminotomy/Foraminotomy  . TONSILLECTOMY  1971    Prior to Admission medications   Medication Sig Start Date End Date Taking? Authorizing Provider  cephALEXin (KEFLEX) 500 MG capsule Take 1 capsule (500 mg total) by mouth 4 (four) times daily for 7  days. 09/26/20 10/03/20 Yes Lucrezia Starch, MD  acetaminophen (TYLENOL) 325 MG tablet Take 650 mg by mouth every 6 (six) hours as needed.    [provider]  carvedilol (COREG) 12.5 MG tablet Take 1 tablet (12.5 mg total) by mouth 2 (two) times daily with a meal. Please keep your upcoming appointment for refills. 06/10/20 09/08/20  Marrianne Mood D, PA-C  diazepam (VALIUM) 10 MG tablet Take 10 mg by mouth at bedtime. 02/01/18   [provider]  DULoxetine (CYMBALTA) 30 MG capsule Take 30 mg by mouth daily. (Total dose = 150 mg)    [provider]  DULoxetine (CYMBALTA) 60 MG capsule Take 120 mg by mouth every morning. (Total dose = 150 mg) 07/10/19   [provider]  gabapentin (NEURONTIN) 300 MG capsule TAKE 1 CAPSULE (300 MG TOTAL) BY MOUTH AT BEDTIME. FOR BACK PAIN. 06/20/20   [name], NP  Multiple Vitamin (MULTIVITAMIN) tablet Take 1 tablet by mouth daily.    [provider]  NUPLAZID 34 MG CAPS Take 1 capsule by mouth daily. 04/03/20   [provider]  OVER THE COUNTER MEDICATION Olly Beauty vitamin    [provider]  OVER THE COUNTER MEDICATION 2 tablets daily. [employer], Historical, MD  pantoprazole (PROTONIX) 40 MG tablet TAKE 1 TABLET BY MOUTH EVERY DAY 09/23/20   [name], NP  simvastatin (ZOCOR) 20 MG tablet TAKE 1 TABLET BY MOUTH EVERYDAY AT BEDTIME 08/25/20   Minna Merritts, MD  spironolactone (ALDACTONE) 25 MG tablet Take 1 tablet (25 mg total) by mouth daily. 07/22/20   Minna Merritts, MD  SUMAtriptan (IMITREX) 100 MG tablet Take 100 mg by mouth as needed. 11/14/19 11/13/20  [provider]  topiramate (TOPAMAX) 50 MG tablet Take 50 mg by mouth daily.     [provider]    Allergies Dilaudid [hydromorphone], Codeine, Darvon, Hydrocodone, Meperidine, Oxycodone, Tramadol, and Victoza [liraglutide]  Family History  Problem Relation Age of Onset  . Heart disease Mother    . Hypertension Mother   . Heart failure Mother        CHF  . Diabetes Mother   . Cancer Mother        CERVICAL CANCER  . Heart disease Father   . Diabetes Father   . Liver disease Father   . Hypertension Father   . Diabetes Sister   . Breast cancer Sister 32  . Heart disease Brother   . Heart failure Brother        CHF  . Diabetes Brother   . Cancer Brother 24       THROAT AND NECK  . Esophageal cancer Brother   . Cancer Maternal Grandfather   . Colon cancer Neg Hx   . Stomach cancer Neg Hx   . Rectal cancer Neg Hx     Social History Social History   Tobacco Use  . Smoking status: Never Smoker  . Smokeless tobacco: Never Used  Vaping Use  . Vaping Use: Never  used  Substance Use Topics  . Alcohol use: No  . Drug use: No    Review of Systems  ROS    ____________________________________________   PHYSICAL EXAM:  VITAL SIGNS: ED Triage Vitals  Enc Vitals Group     BP 09/26/20 0839 (!) 125/58     Pulse Rate 09/26/20 0839 84     Resp 09/26/20 0839 20     Temp 09/26/20 0839 97.7 F (36.5 C)     Temp Source 09/26/20 0839 Oral     SpO2 09/26/20 0839 98 %     Weight 09/26/20 0840 185 lb 3 oz (84 kg)     Height 09/26/20 0840 5\' 2"  (1.575 m)     Head Circumference --      Peak Flow --      Pain Score 09/26/20 0840 8     Pain Loc --      Pain Edu? --      Excl. in [place]? --    Vitals:   09/26/20 0839  BP: (!) 125/58  Pulse: 84  Resp: 20  Temp: 97.7 F (36.5 C)  SpO2: 98%   Physical Exam Vitals and nursing note reviewed.  Constitutional:      General: She is not in acute distress.    Appearance: She is well-developed. She is obese.  HENT:     Head: Normocephalic and atraumatic.     Right Ear: External ear normal.     Left Ear: External ear normal.     Nose: Nose normal.  Eyes:     Conjunctiva/sclera: Conjunctivae normal.  Cardiovascular:     Rate and Rhythm: Normal rate and regular rhythm.     Heart sounds: No murmur heard.   Pulmonary:      Effort: Pulmonary effort is normal. No respiratory distress.     Breath sounds: Normal breath sounds.  Abdominal:     Palpations: Abdomen is soft.     Tenderness: There is abdominal tenderness in the suprapubic area. There is no right CVA tenderness or left CVA tenderness.  Musculoskeletal:     Cervical back: Neck supple.  Skin:    General: Skin is warm and dry.  Neurological:     Mental Status: She is alert and oriented to person, place, and time.  Psychiatric:        Mood and Affect: Mood normal.      ____________________________________________   LABS (all labs ordered are listed, but only abnormal results are displayed)  Labs Reviewed  URINALYSIS, COMPLETE (UACMP) WITH MICROSCOPIC - Abnormal; Notable for the following components:      Result Value   Color, Urine YELLOW (*)    APPearance CLOUDY (*)    Hgb urine dipstick MODERATE (*)    Protein, ur 30 (*)    Leukocytes,Ua LARGE (*)    WBC, UA >50 (*)    Bacteria, UA RARE (*)    All other components within normal limits  BASIC METABOLIC PANEL - Abnormal; Notable for the following components:   Potassium 3.4 (*)    CO2 21 (*)    Glucose, Bld 114 (*)    All other components within normal limits  URINE CULTURE   ____________________________________________  EKG  ____________________________________________  RADIOLOGY  ED MD interpretation:   Official radiology report(s): No results found.  ____________________________________________   PROCEDURES  Procedure(s) performed (including Critical Care):  Procedures   ____________________________________________   INITIAL IMPRESSION / ASSESSMENT AND PLAN / ED COURSE  Patient presents with above to history exam for assessment of acute on subacute urinary retention.  On arrival she is afebrile and hemodynamically stable.  She does have some mild suprapubic tenderness and was able to make a small amount of urine in the ED.  Post void residual shows  proximately 10 cc of urine and no evidence of urinary retention.  BMP shows kidney function WNL no other significant electrolyte or metabolic derangements.  UA does show evidence of likely infection with moderate hemoglobin, 30 protein, large LE S and 21-50 RBCs and greater than 50 WBCs with some rare bacteria.  Urine culture sent.  Suspect this is likely etiology for patient's urinary hesitancy and sensation of retention.  However given she does not appear to be acutely standing on ultrasound and her kidney function is unremarkable and she has no evidence of systemic infection or sepsis or other immediate life-threatening pathology I believe she is safe for discharge with plan for outpatient PCP follow-up.  Rx written in place.  Discharged stable condition.  Strict return cautions advised and discussed.         ____________________________________________   FINAL CLINICAL IMPRESSION(S) / ED DIAGNOSES  Final diagnoses:  Acute cystitis with hematuria    Medications - No data to display   ED Discharge Orders         Ordered    cephALEXin (KEFLEX) 500 MG capsule  4 times daily        09/26/20 1013           Note:  This document was prepared using Dragon voice recognition software and may include unintentional dictation errors.   Lucrezia Starch, MD 09/26/20 1020

## 2020-09-29 LAB — URINE CULTURE: Culture: >=100000 — AB

## 2020-09-30 ENCOUNTER — Ambulatory Visit: Payer: Medicare Other | Admitting: Primary Care

## 2020-10-01 ENCOUNTER — Ambulatory Visit: Payer: Medicare Other

## 2020-10-02 DIAGNOSIS — R3121 Asymptomatic microscopic hematuria: Secondary | ICD-10-CM | POA: Diagnosis not present

## 2020-10-02 DIAGNOSIS — R3911 Hesitancy of micturition: Secondary | ICD-10-CM | POA: Diagnosis not present

## 2020-10-03 ENCOUNTER — Other Ambulatory Visit: Payer: Self-pay

## 2020-10-03 ENCOUNTER — Ambulatory Visit: Payer: Medicare Other | Admitting: Internal Medicine

## 2020-10-07 ENCOUNTER — Other Ambulatory Visit (INDEPENDENT_AMBULATORY_CARE_PROVIDER_SITE_OTHER): Payer: Medicare Other

## 2020-10-07 ENCOUNTER — Ambulatory Visit: Payer: Medicare Other | Admitting: Primary Care

## 2020-10-07 ENCOUNTER — Ambulatory Visit (INDEPENDENT_AMBULATORY_CARE_PROVIDER_SITE_OTHER): Payer: Medicare Other | Admitting: Primary Care

## 2020-10-07 ENCOUNTER — Encounter: Payer: Self-pay | Admitting: Primary Care

## 2020-10-07 ENCOUNTER — Other Ambulatory Visit: Payer: Self-pay

## 2020-10-07 VITALS — BP 120/76 | HR 92 | Temp 97.6°F | Ht 62.0 in | Wt 189.0 lb

## 2020-10-07 DIAGNOSIS — R5383 Other fatigue: Secondary | ICD-10-CM | POA: Diagnosis not present

## 2020-10-07 DIAGNOSIS — E538 Deficiency of other specified B group vitamins: Secondary | ICD-10-CM | POA: Diagnosis not present

## 2020-10-07 DIAGNOSIS — R300 Dysuria: Secondary | ICD-10-CM

## 2020-10-07 DIAGNOSIS — R1900 Intra-abdominal and pelvic swelling, mass and lump, unspecified site: Secondary | ICD-10-CM | POA: Diagnosis not present

## 2020-10-07 DIAGNOSIS — N3090 Cystitis, unspecified without hematuria: Secondary | ICD-10-CM

## 2020-10-07 DIAGNOSIS — Z87440 Personal history of urinary (tract) infections: Secondary | ICD-10-CM | POA: Diagnosis not present

## 2020-10-07 LAB — POC URINALSYSI DIPSTICK (AUTOMATED)
Blood, UA: NEGATIVE
Glucose, UA: NEGATIVE
Nitrite, UA: NEGATIVE
Protein, UA: POSITIVE — AB
Spec Grav, UA: 1.025 (ref 1.010–1.025)
Urobilinogen, UA: NEGATIVE E.U./dL — AB
pH, UA: 5 (ref 5.0–8.0)

## 2020-10-07 LAB — TSH: TSH: 3.33 u[IU]/mL (ref 0.35–4.50)

## 2020-10-07 LAB — VITAMIN B12: Vitamin B-12: 651 pg/mL (ref 211–911)

## 2020-10-07 NOTE — Patient Instructions (Signed)
We will be in touch regarding your urine culture results.  You will be contacted regarding your pelvic ultrasound.  Please let us know if you have not been contacted within two weeks.   It was a pleasure to see you today!

## 2020-10-07 NOTE — Assessment & Plan Note (Signed)
History of recurrent cystitis. Culture positive cystitis on 09/26/20, treated correctly with cephalexin course.   Repeat UA today with 1+ leuks,otherwise negative. Culture pending.

## 2020-10-07 NOTE — Assessment & Plan Note (Signed)
Pelvic and transvaginal ultrasound pending given recurrent symptoms of pelvic fullness.
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
## 2020-10-08 LAB — URINE CULTURE
MICRO NUMBER:: 11677196
SPECIMEN QUALITY:: ADEQUATE

## 2020-10-10 ENCOUNTER — Other Ambulatory Visit: Payer: Medicare Other

## 2020-10-14 DIAGNOSIS — F332 Major depressive disorder, recurrent severe without psychotic features: Secondary | ICD-10-CM | POA: Diagnosis not present

## 2020-10-14 DIAGNOSIS — G20 Parkinson's disease: Secondary | ICD-10-CM | POA: Diagnosis not present

## 2020-10-15 ENCOUNTER — Other Ambulatory Visit: Payer: Self-pay

## 2020-10-15 ENCOUNTER — Ambulatory Visit (HOSPITAL_COMMUNITY)
Admission: RE | Admit: 2020-10-15 | Discharge: 2020-10-15 | Disposition: A | Discharge status: Home / Self Care | Payer: Medicare Other | Source: Ambulatory Visit | Attending: Primary Care | Admitting: Primary Care

## 2020-10-15 DIAGNOSIS — R102 Pelvic and perineal pain: Secondary | ICD-10-CM | POA: Diagnosis not present

## 2020-10-15 DIAGNOSIS — N888 Other specified noninflammatory disorders of cervix uteri: Secondary | ICD-10-CM | POA: Diagnosis not present

## 2020-10-15 DIAGNOSIS — R1900 Intra-abdominal and pelvic swelling, mass and lump, unspecified site: Secondary | ICD-10-CM | POA: Insufficient documentation

## 2020-10-17 ENCOUNTER — Telehealth: Payer: Self-pay | Admitting: Primary Care

## 2020-10-17 NOTE — Telephone Encounter (Signed)
Patient called.  Patient had ultrasound done at [facility] on 10/16/20. Patient received the results on my chart and she is very concerned. Patient wants to see Anda Kraft when she gets back to discuss results.  Patient said her pain is getting worse. Patient is aware that Anda Kraft won't be back until 10/27/20. Please call patient.

## 2020-10-17 NOTE — Telephone Encounter (Signed)
Added to open my chart

## 2020-10-17 NOTE — Telephone Encounter (Signed)
Left message to return call to our office.  

## 2020-10-17 NOTE — Telephone Encounter (Signed)
Added from second message sent by patient.   PS to my earlier message:  I am having a lot of pain ever since having the Ultrasound. A lot of cramping. Some bleeding. and a tremendous amount of cramping.

## 2020-10-20 NOTE — Telephone Encounter (Signed)
[facility] - Client Nonclinical Telephone Record  [facility] Client [place] [facility] - Client Client Site [place] Physician Alma Friendly - NP Contact Type Call Who Is Calling Patient / Member / Family / Caregiver Caller Name [place] Phone Number [phone] Call Type Message Only Information Provided Reason for Call Returning a Call from the Office Initial [place] [facility] [place] is returning a call to the office. Returning a call to [facility] Additional Comment Provided office hours. Disp. Time Disposition Final User 10/17/2020 5:10:59 PM General Information Provided Yes Guadlupe Spanish, Crystal Call Closed By: Waylan Boga Transaction Date/Time: 10/17/2020 5:08:30 PM (ET)

## 2020-10-21 ENCOUNTER — Other Ambulatory Visit: Payer: Self-pay

## 2020-10-21 ENCOUNTER — Telehealth: Payer: Self-pay | Admitting: Primary Care

## 2020-10-21 NOTE — Telephone Encounter (Signed)
LVM for pt to rtn my call to schedule AWV with [facility].  
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
## 2020-10-23 NOTE — Telephone Encounter (Signed)
Noted, she will follow up with Anda Kraft next week

## 2020-10-27 DIAGNOSIS — Z23 Encounter for immunization: Secondary | ICD-10-CM | POA: Diagnosis not present

## 2020-10-28 ENCOUNTER — Ambulatory Visit (INDEPENDENT_AMBULATORY_CARE_PROVIDER_SITE_OTHER): Payer: Medicare Other | Admitting: Primary Care

## 2020-10-28 ENCOUNTER — Other Ambulatory Visit: Payer: Self-pay

## 2020-10-28 VITALS — BP 110/68 | HR 91 | Temp 97.7°F | Ht 62.0 in | Wt 188.0 lb

## 2020-10-28 DIAGNOSIS — R319 Hematuria, unspecified: Secondary | ICD-10-CM | POA: Diagnosis not present

## 2020-10-28 DIAGNOSIS — K219 Gastro-esophageal reflux disease without esophagitis: Secondary | ICD-10-CM

## 2020-10-28 DIAGNOSIS — F0281 Dementia in other diseases classified elsewhere with behavioral disturbance: Secondary | ICD-10-CM

## 2020-10-28 DIAGNOSIS — G20 Parkinson's disease: Secondary | ICD-10-CM

## 2020-10-28 DIAGNOSIS — R1900 Intra-abdominal and pelvic swelling, mass and lump, unspecified site: Secondary | ICD-10-CM

## 2020-10-28 DIAGNOSIS — F02818 Dementia in other diseases classified elsewhere, unspecified severity, with other behavioral disturbance: Secondary | ICD-10-CM

## 2020-10-28 DIAGNOSIS — G3183 Dementia with Lewy bodies: Secondary | ICD-10-CM

## 2020-10-28 LAB — POC URINALSYSI DIPSTICK (AUTOMATED)
Blood, UA: NEGATIVE
Glucose, UA: NEGATIVE
Leukocytes, UA: NEGATIVE
Nitrite, UA: NEGATIVE
Protein, UA: POSITIVE — AB
Spec Grav, UA: >=1.03 — AB (ref 1.010–1.025)
Urobilinogen, UA: 0.2 E.U./dL
pH, UA: 5.5 (ref 5.0–8.0)

## 2020-10-28 MED ORDER — FAMOTIDINE 20 MG PO TABS: 20.0000 mg | ORAL_TABLET | Freq: Every day | ORAL | 0 refills | Status: DC

## 2020-10-28 NOTE — Assessment & Plan Note (Signed)
No recent falls, continue Nuplazid 34 mg. Following with neurology

## 2020-10-28 NOTE — Assessment & Plan Note (Addendum)
Recent pelvic and transvaginal ultrasound negative. Discussed results in great detail with patient and husband today.  Follow up with Urology next month for cystoscopy and renal ultrasound.   UA today not suggestive of infection.

## 2020-10-28 NOTE — Assessment & Plan Note (Addendum)
Hallucinations have returned. Following with neurology.  Continue Nuplazid 34 mg. Will await updated notes from upcoming office visit.
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
## 2020-10-28 NOTE — Assessment & Plan Note (Signed)
Per patient. This could be spotting from her recent vaginal ultrasound.  UA today without leuks, blood, nitrites. She does have some protein. Dark color noted.   She is drinking very little water, encouraged her to increase consumption. Will be seeing Urology next month for cystoscopy and renal ultrasound.   Recent BMP reviewed which shows normal renal function.

## 2020-10-28 NOTE — Assessment & Plan Note (Signed)
Breakthrough symptoms on pantoprazole 40 mg HS.  After speaking with her husband separately it seems as though her diet has been poor, more junk food, ice cream/dairy.   Will add in famotidine 20 mg in AM. Continue pantoprazole 40 mg HS. She will update.

## 2020-10-28 NOTE — Patient Instructions (Signed)
Try taking famotidine 20 mg (Pepcid) every morning for heartburn. Continue taking pantoprazole 40 mg at bedtime for heartburn.  Follow up with Dr. Truman Hayward this month and the Urologist next month.  It was a pleasure to see you today!

## 2020-10-30 ENCOUNTER — Ambulatory Visit: Payer: Medicare Other | Admitting: Primary Care

## 2020-11-10 ENCOUNTER — Other Ambulatory Visit: Payer: Self-pay

## 2020-11-10 ENCOUNTER — Ambulatory Visit (INDEPENDENT_AMBULATORY_CARE_PROVIDER_SITE_OTHER): Payer: Medicare Other | Admitting: Family Medicine

## 2020-11-10 ENCOUNTER — Encounter: Payer: Self-pay | Admitting: Family Medicine

## 2020-11-10 DIAGNOSIS — R300 Dysuria: Secondary | ICD-10-CM

## 2020-11-10 MED ORDER — NITROFURANTOIN MONOHYD MACRO 100 MG PO CAPS: 100.0000 mg | ORAL_CAPSULE | Freq: Two times a day (BID) | ORAL | 0 refills | Status: AC

## 2020-11-10 NOTE — Telephone Encounter (Signed)
Attempted to reach patient to discuss options for appointments. Lvm asking her to call office.

## 2020-11-10 NOTE — Progress Notes (Signed)
Chief Complaint  Patient presents with  . Dysuria  . Urinary Tract Infection    Katherine [name] is a 70 y.o. female here for possible UTI. Due to COVID-19 pandemic, we are interacting via telephone. I verified patient's ID using 2 identifiers. Patient agreed to proceed with visit via this method. Patient is at home, I am at office. Patient and I are present for visit.   Duration: 3 days. Symptoms: Dysuria, urinary hesitancy Denies: urinary frequency, hematuria, urinary retention, fever, nausea, vomiting and flank pain, vaginal discharge Hx of recurrent UTI? Yes Denies new sexual partners.  Past Medical History:  Diagnosis Date  . Anemia 2018  . Anxiety   . Arthritis   . Borderline diabetes   . Chest pain    a. 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 Chicken pox    age 55  . Chronic back pain    Compressed discs. [place]  . Depression   . Dyspnea on exertion    a. 03/2009 Echo: EF 65%, no rwma, triv TR.  Marland Kitchen GERD (gastroesophageal reflux disease)   . High cholesterol   . Hypertension   . Hypokalemia   . Migraine headache   . Obesity   . Parkinson's disease ([place])   . Pre-diabetes    okay now     Objective No conversational dyspnea Age appropriate judgment and insight Nml affect and mood  Dysuria - Plan: nitrofurantoin, macrocrystal-monohydrate, (MACROBID) 100 MG capsule  Stay hydrated. Seek immediate care if pt starts to develop fevers, new/worsening symptoms, uncontrollable N/V. F/u prn. Total time: 6 min The patient voiced understanding and agreement to the plan.  [place], DO 11/10/20 2:08 PM

## 2020-11-11 ENCOUNTER — Other Ambulatory Visit: Payer: Self-pay

## 2020-11-11 ENCOUNTER — Ambulatory Visit: Payer: Medicare Other | Admitting: Primary Care

## 2020-11-11 ENCOUNTER — Telehealth: Payer: Self-pay

## 2020-11-11 ENCOUNTER — Emergency Department
Admission: EM | Admit: 2020-11-11 | Discharge: 2020-11-13 | Disposition: A | Discharge status: Other Acute Inpatient Hospital | Payer: Medicare Other | Attending: Emergency Medicine | Admitting: Emergency Medicine

## 2020-11-11 DIAGNOSIS — F0281 Dementia in other diseases classified elsewhere with behavioral disturbance: Secondary | ICD-10-CM | POA: Diagnosis not present

## 2020-11-11 DIAGNOSIS — R7303 Prediabetes: Secondary | ICD-10-CM | POA: Insufficient documentation

## 2020-11-11 DIAGNOSIS — F02818 Dementia in other diseases classified elsewhere, unspecified severity, with other behavioral disturbance: Secondary | ICD-10-CM

## 2020-11-11 DIAGNOSIS — Z20822 Contact with and (suspected) exposure to covid-19: Secondary | ICD-10-CM | POA: Diagnosis not present

## 2020-11-11 DIAGNOSIS — G20 Parkinson's disease: Secondary | ICD-10-CM | POA: Diagnosis not present

## 2020-11-11 DIAGNOSIS — F0390 Unspecified dementia without behavioral disturbance: Secondary | ICD-10-CM | POA: Diagnosis present

## 2020-11-11 DIAGNOSIS — I10 Essential (primary) hypertension: Secondary | ICD-10-CM | POA: Insufficient documentation

## 2020-11-11 DIAGNOSIS — Z79899 Other long term (current) drug therapy: Secondary | ICD-10-CM | POA: Insufficient documentation

## 2020-11-11 LAB — URINALYSIS, COMPLETE (UACMP) WITH MICROSCOPIC
Bacteria, UA: NONE SEEN
Bilirubin Urine: NEGATIVE
Glucose, UA: NEGATIVE mg/dL
Hgb urine dipstick: NEGATIVE
Ketones, ur: NEGATIVE mg/dL
Leukocytes,Ua: NEGATIVE
Nitrite: NEGATIVE
Protein, ur: NEGATIVE mg/dL
Specific Gravity, Urine: 1.004 — ABNORMAL LOW (ref 1.005–1.030)
pH: 7 (ref 5.0–8.0)

## 2020-11-11 LAB — ACETAMINOPHEN LEVEL: Acetaminophen (Tylenol), Serum: <10 ug/mL — ABNORMAL LOW (ref 10–30)

## 2020-11-11 LAB — URINE DRUG SCREEN, QUALITATIVE (ARMC ONLY)
Amphetamines, Ur Screen: NOT DETECTED
Barbiturates, Ur Screen: NOT DETECTED
Benzodiazepine, Ur Scrn: POSITIVE — AB
Cannabinoid 50 Ng, Ur ~~LOC~~: NOT DETECTED
Cocaine Metabolite,Ur ~~LOC~~: NOT DETECTED
MDMA (Ecstasy)Ur Screen: NOT DETECTED
Methadone Scn, Ur: NOT DETECTED
Opiate, Ur Screen: NOT DETECTED
Phencyclidine (PCP) Ur S: NOT DETECTED
Tricyclic, Ur Screen: NOT DETECTED

## 2020-11-11 LAB — CBC
HCT: 44.8 % (ref 36.0–46.0)
Hemoglobin: 14 g/dL (ref 12.0–15.0)
MCH: 29.5 pg (ref 26.0–34.0)
MCHC: 31.3 g/dL (ref 30.0–36.0)
MCV: 94.5 fL (ref 80.0–100.0)
Platelets: 326 10*3/uL (ref 150–400)
RBC: 4.74 MIL/uL (ref 3.87–5.11)
RDW: 14.6 % (ref 11.5–15.5)
WBC: 11.5 10*3/uL — ABNORMAL HIGH (ref 4.0–10.5)
nRBC: 0 % (ref 0.0–0.2)

## 2020-11-11 LAB — COMPREHENSIVE METABOLIC PANEL
ALT: 12 U/L (ref 0–44)
AST: 20 U/L (ref 15–41)
Albumin: 4.2 g/dL (ref 3.5–5.0)
Alkaline Phosphatase: 110 U/L (ref 38–126)
Anion gap: 9 (ref 5–15)
BUN: 15 mg/dL (ref 8–23)
CO2: 22 mmol/L (ref 22–32)
Calcium: 9 mg/dL (ref 8.9–10.3)
Chloride: 109 mmol/L (ref 98–111)
Creatinine, Ser: 0.91 mg/dL (ref 0.44–1.00)
GFR, Estimated: >60 mL/min (ref >60)
Glucose, Bld: 133 mg/dL — ABNORMAL HIGH (ref 70–99)
Potassium: 3.1 mmol/L — ABNORMAL LOW (ref 3.5–5.1)
Sodium: 140 mmol/L (ref 135–145)
Total Bilirubin: 0.6 mg/dL (ref 0.3–1.2)
Total Protein: 7.1 g/dL (ref 6.5–8.1)

## 2020-11-11 LAB — SALICYLATE LEVEL: Salicylate Lvl: <7 mg/dL — ABNORMAL LOW (ref 7.0–30.0)

## 2020-11-11 LAB — ETHANOL: Alcohol, Ethyl (B): <10 mg/dL (ref <10)

## 2020-11-11 MED ORDER — PIMAVANSERIN TARTRATE 34 MG PO CAPS: 1.0000 | ORAL_CAPSULE | Freq: Every day | ORAL | Status: DC

## 2020-11-11 MED ORDER — DULOXETINE HCL 60 MG PO CPEP: 120.0000 mg | ORAL_CAPSULE | Freq: Every morning | ORAL | Status: DC

## 2020-11-11 MED ORDER — FAMOTIDINE 20 MG PO TABS: 20.0000 mg | ORAL_TABLET | Freq: Every day | ORAL | Status: DC

## 2020-11-11 MED ORDER — NITROFURANTOIN MONOHYD MACRO 100 MG PO CAPS: 100.0000 mg | ORAL_CAPSULE | Freq: Two times a day (BID) | ORAL | Status: DC

## 2020-11-11 MED ORDER — OLANZAPINE 5 MG PO TBDP: 5.0000 mg | ORAL_TABLET | Freq: Three times a day (TID) | ORAL | Status: DC | PRN

## 2020-11-11 MED ORDER — SIMVASTATIN 10 MG PO TABS: 20.0000 mg | ORAL_TABLET | Freq: Every day | ORAL | Status: DC

## 2020-11-11 MED ORDER — DULOXETINE HCL 30 MG PO CPEP
30.0000 mg | ORAL_CAPSULE | Freq: Every day | ORAL | Status: DC
  Filled 2020-11-11 (×3): qty 1

## 2020-11-11 MED ORDER — CARVEDILOL 6.25 MG PO TABS: 12.5000 mg | ORAL_TABLET | Freq: Two times a day (BID) | ORAL | Status: DC

## 2020-11-11 MED ORDER — ADULT MULTIVITAMIN W/MINERALS CH: 1.0000 | ORAL_TABLET | Freq: Every day | ORAL | Status: DC

## 2020-11-11 MED ORDER — DIAZEPAM 5 MG PO TABS: 5.0000 mg | ORAL_TABLET | Freq: Every day | ORAL | Status: DC
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
## 2020-11-11 NOTE — ED Provider Notes (Signed)
The patient has been placed in psychiatric observation due to the need to provide a safe environment for the patient while obtaining psychiatric consultation and evaluation, as well as ongoing medical and medication management to treat the patient's condition.  The patient has been placed under full IVC at this time.    Delman Kitten, MD 11/11/20 [phone]
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
## 2020-11-11 NOTE — ED Triage Notes (Addendum)
Pt states she forgot she had already taken her morning meds and took a second dose of them. Denies SI/HI. Pt is a/ox4 on arrival , ambulatory with a steady gait. Pt states less then an hour after taken the second dose she purposely vomited multiple times until she felt she had emptied her stomach.

## 2020-11-11 NOTE — BH Assessment (Signed)
Referral information for Geriatric Psychiatric Hospitalization faxed to;    [facility] [phone]- [phone]),    [facility] ([phone]---[phone]),   [place] [phone], [phone], [phone] or [phone]),    [facility] [phone]),    [facility] [phone] -or- [phone]),    [facility] [phone] or [phone]),    [facility] [phone] -[phone]) [phone]

## 2020-11-11 NOTE — ED Notes (Signed)
Hourly rounding reveals patient in room. No complaints, stable, in no acute distress. Q15 minute rounds and monitoring via Security Cameras to continue. 
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
## 2020-11-11 NOTE — ED Notes (Signed)
IVC, psych consult complete, pend placement
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
## 2020-11-11 NOTE — Telephone Encounter (Signed)
Can we call and speak with her husband Alizah Sills? Did this actually occur? What can we do to help?
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
## 2020-11-11 NOTE — ED Notes (Signed)
Patient Items:  Puppy Dog Mask Green Shirt Blue Bra Marathon Oil Jeans Black Shoes Silver Necklace with Pearl Silver Watch Gold/Green Earrings 4 Rings Pink/White Panties
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
## 2020-11-11 NOTE — ED Notes (Signed)
Hourly rounding reveals patient in room. No complaints, stable, in no acute distress. Q15 minute rounds and monitoring via Rover and Officer to continue.   

## 2020-11-12 ENCOUNTER — Telehealth: Payer: Self-pay

## 2020-11-12 DIAGNOSIS — F0281 Dementia in other diseases classified elsewhere with behavioral disturbance: Secondary | ICD-10-CM | POA: Diagnosis not present

## 2020-11-12 LAB — RESP PANEL BY RT-PCR (FLU A&B, COVID) ARPGX2
Influenza A by PCR: NEGATIVE
Influenza B by PCR: NEGATIVE
SARS Coronavirus 2 by RT PCR: NEGATIVE

## 2020-11-12 MED ORDER — POTASSIUM CHLORIDE CRYS ER 20 MEQ PO TBCR: 40.0000 meq | EXTENDED_RELEASE_TABLET | Freq: Once | ORAL | Status: DC

## 2020-11-12 NOTE — ED Provider Notes (Signed)
Emergency Medicine Observation Re-evaluation Note  Katherine [name] is a 70 y.o. female, seen on rounds today.  Pt initially presented to the ED for complaints of Drug Overdose Currently, the patient is resting.  Physical Exam  BP (!) 103/51 (BP Location: Left Arm)   Pulse 91   Temp (!) 97.5 F (36.4 C) (Oral)   Resp 17   Ht 1.575 m (5\' 2" )   Wt 79.4 kg   SpO2 96%   BMI 32.01 kg/m  Physical Exam Gen:  No acute distress Resp:  Breathing easily and comfortably, no accessory muscle usage Neuro:  Moving all four extremities, no gross focal neuro deficits Psych:  Resting currently, calm when awake  ED Course / MDM  EKG:   I have reviewed the labs performed to date as well as medications administered while in observation.  Recent changes in the last 24 hours include placement.  Plan  Current plan is for transfer to [facility] today.. Patient is under full IVC at this time.   Hinda Kehr, MD 11/12/20 [phone]

## 2020-11-12 NOTE — ED Notes (Signed)
Upon entry at shift change pt noted to be alert and lying comfortably in bed, denies needs at this time and thanked Therapist, sports and MD for checking in.  Husband lying comfortably at bedside, denies needs. Will continue to monitor and round on patient.

## 2020-11-12 NOTE — BH Assessment (Signed)
TTS left message on voicemail at [facility] Admissions [phone] to return call regarding transport time.

## 2020-11-12 NOTE — ED Notes (Signed)
MD Jessup at bedside to explain delay in transfer. Pt husband verbalizes understanding at this time. Pt appears to be in good mood and cooperative at this time. Pt appears calm.

## 2020-11-12 NOTE — ED Notes (Signed)
Hourly rounding reveals patient in room. No complaints, stable, in no acute distress. Q15 minute rounds and monitoring via Rover and Officer to continue.   

## 2020-11-12 NOTE — ED Notes (Signed)
VS not taken, patient asleep 

## 2020-11-12 NOTE — Telephone Encounter (Signed)
Noted, ED notes reviewd. Patient is under full IVC and will be transferring to [facility] today. I agree with this plan given her recent actions and progression of dementia symptoms.

## 2020-11-12 NOTE — ED Notes (Signed)
Pt provided breakfast tray.

## 2020-11-12 NOTE — BH Assessment (Signed)
TIME PATIENT CAN ARRIVE IS CURRENTLY PENDING, DAVIS TO CONTACT BACK  Patient has been accepted to [facility].  Accepting physician is Dr. Lucilla Edin.  Call report to [phone].     ER Staff is aware of it:  [facility] ER Secretary  Dr. Karma Greaser, ER MD  [facility] Patient's Nurse   Address: [address], [place] [address]

## 2020-11-12 NOTE — BH Assessment (Signed)
TTS faxed negative Covid results to [facility] admissions [phone].

## 2020-11-12 NOTE — ED Notes (Signed)
Called [facility] for transport to [facility] [phone]

## 2020-11-12 NOTE — ED Notes (Signed)
Called for transport to [facility], per C Com from [facility]  at [employer].  No transport today and tomorrow several scheduled. Call in am to check status.

## 2020-11-12 NOTE — ED Notes (Signed)
Spoke to [place] at [facility] not aware of patient connected her to [facility]

## 2020-11-12 NOTE — ED Notes (Signed)
Pharmacy contacted to send cymbalta

## 2020-11-12 NOTE — Telephone Encounter (Signed)
Pt reports that she is in the hosp now and they are trying to send her to rehab and she has psych appt on Fri and he will be going out of the country--- they want to keep her 3-5 days she wants your help as she does not want to stay in the hospital

## 2020-11-12 NOTE — BH Assessment (Signed)
TTS has verified with [facility]) that patient has been accepted. Must have Covid results before patient is to be transported. Awaiting that information at this time.

## 2020-11-12 NOTE — ED Notes (Signed)
RN to bedside to provide morning meds, husband at bedside states he already gave all morning meds

## 2020-11-12 NOTE — BH Assessment (Signed)
TTS received call from [facility]) [phone] stating she was not aware of patient's acceptance; however, she was able to locate admission packet. [facility] Kid[facility] will call back with information regarding time and bed number.

## 2020-11-13 ENCOUNTER — Ambulatory Visit: Payer: Medicare Other | Admitting: Primary Care

## 2020-11-13 DIAGNOSIS — F0281 Dementia in other diseases classified elsewhere with behavioral disturbance: Secondary | ICD-10-CM | POA: Diagnosis not present

## 2020-11-13 DIAGNOSIS — E876 Hypokalemia: Secondary | ICD-10-CM | POA: Diagnosis present

## 2020-11-13 DIAGNOSIS — E7800 Pure hypercholesterolemia, unspecified: Secondary | ICD-10-CM | POA: Diagnosis present

## 2020-11-13 DIAGNOSIS — G43909 Migraine, unspecified, not intractable, without status migrainosus: Secondary | ICD-10-CM | POA: Diagnosis present

## 2020-11-13 DIAGNOSIS — G20 Parkinson's disease: Secondary | ICD-10-CM | POA: Diagnosis not present

## 2020-11-13 DIAGNOSIS — Z9114 Patient's other noncompliance with medication regimen: Secondary | ICD-10-CM | POA: Diagnosis not present

## 2020-11-13 DIAGNOSIS — I10 Essential (primary) hypertension: Secondary | ICD-10-CM | POA: Diagnosis not present

## 2020-11-13 DIAGNOSIS — K219 Gastro-esophageal reflux disease without esophagitis: Secondary | ICD-10-CM | POA: Diagnosis present

## 2020-11-13 DIAGNOSIS — R7303 Prediabetes: Secondary | ICD-10-CM | POA: Diagnosis present

## 2020-11-13 DIAGNOSIS — E669 Obesity, unspecified: Secondary | ICD-10-CM | POA: Diagnosis present

## 2020-11-13 DIAGNOSIS — F419 Anxiety disorder, unspecified: Secondary | ICD-10-CM | POA: Diagnosis present

## 2020-11-13 DIAGNOSIS — G3183 Dementia with Lewy bodies: Secondary | ICD-10-CM | POA: Diagnosis not present

## 2020-11-13 DIAGNOSIS — F0280 Dementia in other diseases classified elsewhere without behavioral disturbance: Secondary | ICD-10-CM | POA: Diagnosis present

## 2020-11-13 DIAGNOSIS — Z885 Allergy status to narcotic agent status: Secondary | ICD-10-CM | POA: Diagnosis not present

## 2020-11-13 DIAGNOSIS — M549 Dorsalgia, unspecified: Secondary | ICD-10-CM | POA: Diagnosis present

## 2020-11-13 DIAGNOSIS — D649 Anemia, unspecified: Secondary | ICD-10-CM | POA: Diagnosis present

## 2020-11-13 DIAGNOSIS — M1990 Unspecified osteoarthritis, unspecified site: Secondary | ICD-10-CM | POA: Diagnosis present

## 2020-11-13 DIAGNOSIS — G8929 Other chronic pain: Secondary | ICD-10-CM | POA: Diagnosis present

## 2020-11-13 DIAGNOSIS — F29 Unspecified psychosis not due to a substance or known physiological condition: Secondary | ICD-10-CM | POA: Diagnosis not present

## 2020-11-13 NOTE — ED Provider Notes (Signed)
Emergency Medicine Observation Re-evaluation Note  Katherine [name] is a 70 y.o. female, seen on rounds today.  Pt initially presented to the ED for complaints of Drug Overdose Currently, the patient is resting comfortably, husband also sleeping in the room next to her, she alerts easily to voice reports no concerns or problems at this time.  She is calm.  Physical Exam  BP 109/68   Pulse 91   Temp 98 F (36.7 C) (Oral)   Resp 18   Ht 5\' 2"  (1.575 m)   Wt 79.4 kg   SpO2 95%   BMI 32.01 kg/m  Physical Exam General: Resting calm without distress Cardiac: Warm well perfused Lungs: Clear speech, no distress resting comfortably on pillow.  Alerts easily to voice Psych: She is calm and pleasant at this time, showing no evidence of psychomotor agitation  ED Course / MDM  EKG:EKG Interpretation   I have reviewed the labs performed to date as well as medications administered while in observation.  Recent changes in the last 24 hours include negative COVID test.  Plan  Current plan is for psychiatric placement. Patient is under full IVC at this time.   Delman Kitten, MD 11/13/20 [phone]

## 2020-11-13 NOTE — ED Notes (Signed)
EMTALA Reviewed by this RN.  

## 2020-11-13 NOTE — ED Notes (Signed)
Pt given breakfast.

## 2020-11-13 NOTE — ED Notes (Signed)
Notified by unit clerk that transport is in route for patient. Attempted to have patient's husband sign transport consent on patient's behalf. Husband asked if it was possible to refuse transport. Reports he prefers to take patient home, feels safe taking patient home. Reports patient has psych appt tomorrow and neuro appt in two weeks. Agricultural consultant, Brewing technologist, and MD notified. MD will speak with patient and family regarding options given that patient is IVC'd.

## 2020-11-20 ENCOUNTER — Ambulatory Visit: Payer: Medicare Other

## 2020-11-20 ENCOUNTER — Telehealth: Payer: Self-pay

## 2020-11-20 NOTE — Telephone Encounter (Signed)
Received notes from [facility]. Wanted patient to follow up with PCP for HLD and UTI. I have called and made follow up for 5/17 in 75min appointment. Did you want me to add sooner in one of the 3:40 slots or is that ok?

## 2020-11-20 NOTE — Telephone Encounter (Signed)
Notes are in your folder for review

## 2020-11-24 NOTE — Telephone Encounter (Signed)
Reviewed notes, okay to wait until 12/02/20 as scheduled.

## 2020-11-25 NOTE — Telephone Encounter (Signed)
No further action needed patient aware of appointment when I made.

## 2020-12-01 ENCOUNTER — Telehealth: Payer: Self-pay | Admitting: Primary Care

## 2020-12-01 NOTE — Telephone Encounter (Signed)
Pt called to reschedule 5/17 appt. She states she is going out of town and wanted to move it to the end of June. She wanted me to send a message to Anda Kraft to let her know that she is feeling better. She also stated she will be seeing her specialist for her parkinson's between now and her appt with Anda Kraft on 6/24 so she will also have an update on that at the time of her appointment.

## 2020-12-02 ENCOUNTER — Ambulatory Visit: Payer: Medicare Other | Admitting: Primary Care

## 2020-12-02 NOTE — Telephone Encounter (Signed)
Noted  

## 2020-12-08 ENCOUNTER — Telehealth: Payer: Self-pay | Admitting: Cardiovascular Disease

## 2020-12-08 ENCOUNTER — Telehealth: Payer: Self-pay | Admitting: Primary Care

## 2020-12-08 NOTE — Telephone Encounter (Signed)
Patient spouse came by office and dropped off form to be completed for [facility] Placed in nurse box

## 2020-12-08 NOTE — Telephone Encounter (Signed)
Patient spouse Jadyn Brasher  came in office requesting a Medical report form be filled out for patient . It was place in folder

## 2020-12-08 NOTE — Telephone Encounter (Signed)
Patient spouse Doyle Tegethoff  came in office requesting medical report forms be filled out for patient . Place in folder

## 2020-12-08 NOTE — Telephone Encounter (Signed)
Placed in your folder for review. 

## 2020-12-09 NOTE — Telephone Encounter (Signed)
Called and spoke with pt's husband Katherine [name] (DPR approved), advised pt's paperwork/form has been completed for the cardiovascular portion and Dr. Rockey Situ has singed.  They pick it up at anytime M-F 8am-5pm at the front desk when convenient to them. Katherine [name] is grateful for the return call and quick turn around time.

## 2020-12-11 DIAGNOSIS — R3121 Asymptomatic microscopic hematuria: Secondary | ICD-10-CM | POA: Diagnosis not present

## 2020-12-11 DIAGNOSIS — R3911 Hesitancy of micturition: Secondary | ICD-10-CM | POA: Diagnosis not present

## 2020-12-11 NOTE — Telephone Encounter (Signed)
Completed paperwork. Please call her husband Katherine [name] and notify that paperwork is ready to pick up.

## 2020-12-11 NOTE — Telephone Encounter (Signed)
Spouse notified form ready for pick up

## 2020-12-12 DIAGNOSIS — M4316 Spondylolisthesis, lumbar region: Secondary | ICD-10-CM

## 2020-12-17 ENCOUNTER — Telehealth: Payer: Self-pay

## 2020-12-17 NOTE — Telephone Encounter (Signed)
Noted, will see tomorrow

## 2020-12-17 NOTE — Telephone Encounter (Signed)
Pt said she is having burning upon urination with urgency; no abd or back pain and no fever or other covid symptoms and no known exposure to + covid. Pt was advised by access nurse to go to UC but pt said she is not going to UC. UC & ED precautions given and pt voiced understanding but thinks she will be able to wait to see Dr Einar Pheasant on 12/18/20 at 3:40pm. Will add access nurse note when in portal. Sending note to Dr Einar Pheasant.

## 2020-12-17 NOTE — Telephone Encounter (Signed)
[facility] - Client TELEPHONE ADVICE RECORD [facility] Patient Name: Katherine [name] Gender: Female DOB: 11-Sep-1950 Age: 70 Y 2 M 7 D Return Phone Number: [phone] (Primary), [phone] (Secondary) Address: City/ State/ Zip: [place] [address] Client [facility] - Client Client Site [facility] - Day Physician Alma Friendly - NP Contact Type Call Who Is Calling Patient / Member / Family / Caregiver Call Type Triage / Clinical Relationship To Patient Self Return Phone Number [phone] (Primary) Chief Complaint Urination Pain Reason for Call Symptomatic / Request for [name] states she is experiencing uti sx. She has burning and pain while urinating. Translation No Nurse Assessment Nurse: Ysidro Evert, RN, Levada Dy Date/Time (Eastern Time): 12/17/2020 3:58:25 PM Confirm and document reason for call. If symptomatic, describe symptoms. ---Caller states she is having pain and burning with urination that started this morning. No fever Does the patient have any new or worsening symptoms? ---Yes Will a triage be completed? ---Yes Related visit to physician within the last 2 weeks? ---No Does the PT have any chronic conditions? (i.e. diabetes, asthma, this includes High risk factors for pregnancy, etc.) ---Yes List chronic conditions. ---Parkinsons Is this a behavioral health or substance abuse call? ---No Guidelines Guideline Title Affirmed Question Affirmed Notes Nurse Date/Time Eilene Ghazi Time) Urination Pain - Female Age > 56 years Earleen Reaper 12/17/2020 3:59:08 PM Disp. Time Eilene Ghazi Time) Disposition Final User 12/17/2020 3:24:54 PM Attempt made - message left Earleen Reaper 12/17/2020 3:44:57 PM Attempt made - message left Earleen Reaper 12/17/2020 4:02:15 PM See PCP within 24 Hours Yes Ysidro Evert, RN, Levada Dy PLEASE NOTE: All timestamps contained  within this report are represented as [place] Standard Time. CONFIDENTIALTY NOTICE: This fax transmission is intended only for the addressee. It contains information that is legally privileged, confidential or otherwise protected from use or disclosure. If you are not the intended recipient, you are strictly prohibited from reviewing, disclosing, copying using or disseminating any of this information or taking any action in reliance on or regarding this information. If you have received this fax in error, please notify Katherine immediately by telephone so that we can arrange for its return to Katherine. Phone: [phone], Toll-Free: [phone], Fax: [phone] Page: 2 of 2 Call Id: [id number] [place] Disagree/Comply Comply Caller Understands Yes PreDisposition Did not know what to do Care Advice Given Per Guideline SEE PCP WITHIN 24 HOURS: DRINK EXTRA FLUIDS: * Drink extra fluids. CALL BACK IF: * You become worse CARE ADVICE given per Urination Pain - Female (Adult) guideline. Referrals GO TO FACILITY UNDECIDED

## 2020-12-18 ENCOUNTER — Ambulatory Visit: Payer: Medicare Other | Admitting: Family Medicine

## 2020-12-24 DIAGNOSIS — G20 Parkinson's disease: Secondary | ICD-10-CM | POA: Diagnosis not present

## 2020-12-24 DIAGNOSIS — F331 Major depressive disorder, recurrent, moderate: Secondary | ICD-10-CM | POA: Diagnosis not present

## 2020-12-25 ENCOUNTER — Other Ambulatory Visit: Payer: Self-pay | Admitting: Primary Care

## 2020-12-25 DIAGNOSIS — K219 Gastro-esophageal reflux disease without esophagitis: Secondary | ICD-10-CM

## 2021-01-09 ENCOUNTER — Ambulatory Visit: Payer: Medicare Other | Admitting: Primary Care

## 2021-01-09 ENCOUNTER — Other Ambulatory Visit: Payer: Self-pay | Admitting: Primary Care

## 2021-01-09 DIAGNOSIS — R296 Repeated falls: Secondary | ICD-10-CM

## 2021-01-09 DIAGNOSIS — M4316 Spondylolisthesis, lumbar region: Secondary | ICD-10-CM

## 2021-01-12 ENCOUNTER — Ambulatory Visit: Payer: Medicare Other

## 2021-01-13 MED ORDER — GABAPENTIN 300 MG PO CAPS: 300.0000 mg | ORAL_CAPSULE | Freq: Every day | ORAL | 0 refills | Status: DC

## 2021-01-14 ENCOUNTER — Telehealth: Payer: Self-pay | Admitting: Primary Care

## 2021-01-14 NOTE — Telephone Encounter (Signed)
Pt came in office to drop off DMV forms. Placed in provider box

## 2021-01-16 NOTE — Telephone Encounter (Signed)
Completed, please call patient for pickup.

## 2021-01-16 NOTE — Telephone Encounter (Signed)
Per patient request have called cell phone to let know ready for pick up no answer no voicemail.

## 2021-01-21 ENCOUNTER — Other Ambulatory Visit: Payer: Medicare Other

## 2021-01-22 DIAGNOSIS — R441 Visual hallucinations: Secondary | ICD-10-CM | POA: Diagnosis not present

## 2021-01-22 DIAGNOSIS — F22 Delusional disorders: Secondary | ICD-10-CM | POA: Diagnosis not present

## 2021-01-22 DIAGNOSIS — F060 Psychotic disorder with hallucinations due to known physiological condition: Secondary | ICD-10-CM | POA: Diagnosis not present

## 2021-01-22 DIAGNOSIS — F0281 Dementia in other diseases classified elsewhere with behavioral disturbance: Secondary | ICD-10-CM | POA: Diagnosis not present

## 2021-01-22 DIAGNOSIS — F062 Psychotic disorder with delusions due to known physiological condition: Secondary | ICD-10-CM | POA: Diagnosis not present

## 2021-01-22 DIAGNOSIS — G3183 Dementia with Lewy bodies: Secondary | ICD-10-CM | POA: Diagnosis not present

## 2021-01-26 ENCOUNTER — Other Ambulatory Visit: Payer: Self-pay | Admitting: Primary Care

## 2021-01-26 DIAGNOSIS — K219 Gastro-esophageal reflux disease without esophagitis: Secondary | ICD-10-CM

## 2021-01-31 ENCOUNTER — Other Ambulatory Visit: Payer: Self-pay | Admitting: Primary Care

## 2021-01-31 DIAGNOSIS — M4316 Spondylolisthesis, lumbar region: Secondary | ICD-10-CM

## 2021-02-03 NOTE — Telephone Encounter (Signed)
She shouldn't be out of medication, just filled 90 day supply one month ago. Make sure she's taking gabapentin 300 mg once nightly. If she's taking more frequently then I need to know. Thanks.

## 2021-02-03 NOTE — Telephone Encounter (Signed)
Spoke to husband this was auto refill and did not need at this time.

## 2021-02-13 ENCOUNTER — Other Ambulatory Visit: Payer: Medicare Other

## 2021-02-18 ENCOUNTER — Ambulatory Visit: Payer: Medicare Other | Admitting: Primary Care

## 2021-02-20 ENCOUNTER — Other Ambulatory Visit: Payer: Medicare Other

## 2021-02-22 DIAGNOSIS — Z20822 Contact with and (suspected) exposure to covid-19: Secondary | ICD-10-CM | POA: Diagnosis not present

## 2021-02-24 ENCOUNTER — Telehealth (INDEPENDENT_AMBULATORY_CARE_PROVIDER_SITE_OTHER): Payer: Medicare Other | Admitting: Internal Medicine

## 2021-02-24 VITALS — BP 130/68 | Temp 98.2°F

## 2021-02-24 DIAGNOSIS — U071 COVID-19: Secondary | ICD-10-CM | POA: Diagnosis not present

## 2021-02-24 MED ORDER — MOLNUPIRAVIR EUA 200MG CAPSULE: 4.0000 | ORAL_CAPSULE | Freq: Two times a day (BID) | ORAL | 0 refills | Status: AC

## 2021-02-24 NOTE — Telephone Encounter (Signed)
Please call to triage

## 2021-02-24 NOTE — Telephone Encounter (Signed)
Noted and appreciate Dr. Ethel Rana evaluation.

## 2021-02-24 NOTE — Telephone Encounter (Signed)
I spoke with pt; pt tested + with test at [facility] on 02/23/21; symptoms started on 02/21/21 with body aches, fever 100.2,pt has H/A now with pain level of 8, chills, dry cough, diarrhea, runny nose and sinus congestion, no vomiting or SOB. Pt scheduled video visit with DR Larose Kells 02/24/21 at 4:00 pm. Pt will self quarantine, drink plenty of fluids, rest and take tylenol for fever and body aches. UC & ED precautions given and pt voiced understanding. Sending note to Dr Larose Kells, Gentry Fitz NP and [facility] CMA.

## 2021-02-24 NOTE — Progress Notes (Signed)
Subjective:    Patient ID: Katherine [name], female    DOB: 1950/09/11, 70 y.o.   MRN: [id number]  DOS:  02/24/2021 Type of visit - description: Virtual Visit via Video Note  I connected with the above patient  by a video enabled telemedicine application and verified that I am speaking with the correct person using two identifiers.   THIS ENCOUNTER IS A VIRTUAL VISIT DUE TO COVID-19 - PATIENT WAS NOT SEEN IN THE OFFICE. PATIENT HAS CONSENTED TO VIRTUAL VISIT / TELEMEDICINE VISIT   Location of patient: home  Location of provider: office  Persons participating in the virtual visit: patient, provider   I discussed the limitations of evaluation and management by telemedicine and the availability of in person appointments. The patient expressed understanding and agreed to proceed.  Acute  Symptoms a started 02/21/2021, late in the afternoon. Fever, runny nose, fatigue, sinus congestion, sore throat and severe muscle aches. Symptoms were very intense that day. The next day 02/22/2021 she got tested for COVID, they came back positive.  Over the last 2 days she is gradually better, currently with mild subjective fever, still has some myalgias but definitely not as bad as before.  Denies chest pain, shortness of breath. Has a mild cough, dry. Urine is normal in quantity and in color  Review of Systems See above   Past Medical History:  Diagnosis Date   Anemia 2018   Anxiety    Arthritis    Borderline diabetes    Chest pain    a. 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 [facility]   Depression    Dyspnea on exertion    a. 03/2009 Echo: EF 65%, no rwma, triv TR.   GERD (gastroesophageal reflux disease)    High cholesterol    Hypertension    Hypokalemia    Migraine headache    Obesity    Parkinson's disease ([place])     Pre-diabetes    okay now     Past Surgical History:  Procedure Laterality Date   BLADDER SUSPENSION  2005   CARDIAC CATHETERIZATION     CESAREAN SECTION     CLOSED REDUCTION NASAL FRACTURE N/A 01/11/2020   Procedure: CLOSED REDUCTION NASAL FRACTURE;  Surgeon: Karan Gust, MD;  Location: [facility];  Service: ENT;  Laterality: N/A;   ENDOMETRIAL ABLATION  2007   FOOT SURGERY     bilateral bunions   LUMBAR LAMINECTOMY/DECOMPRESSION MICRODISCECTOMY Bilateral 01/12/2016   Procedure: Bilateral L4-5 Laminotomy/Foraminotomy;  Surgeon: Newman Pies, MD;  Location: [facility];  Service: Neurosurgery;  Laterality: Bilateral;  Bilateral L4-5 Laminotomy/Foraminotomy   TONSILLECTOMY  1971    Allergies as of 02/24/2021       Reactions   Dilaudid [hydromorphone]    hallucinations   Codeine Anxiety   Darvon Anxiety   Hydrocodone Anxiety   Meperidine Anxiety   Oxycodone Anxiety   Tramadol Anxiety   Victoza [liraglutide] Nausea Only, Other (See Comments)   And weakness        Medication List        Accurate as of February 24, 2021  4:14 PM. If you have any questions, ask your nurse or doctor.          acetaminophen 325 MG tablet Commonly known as: TYLENOL Take 650 mg by mouth  every 6 (six) hours as needed.   buPROPion 150 MG 24 hr tablet Commonly known as: WELLBUTRIN XL Take 1 tablet by mouth every morning.   carvedilol 12.5 MG tablet Commonly known as: COREG Take 1 tablet (12.5 mg total) by mouth 2 (two) times daily with a meal. Please keep your upcoming appointment for refills.   diazepam 10 MG tablet Commonly known as: VALIUM Take 10 mg by mouth at bedtime.   DULoxetine 30 MG capsule Commonly known as: CYMBALTA Take 30 mg by mouth daily. (Total dose = 150 mg)   DULoxetine 60 MG capsule Commonly known as: CYMBALTA Take 120 mg by mouth every morning. (Total dose = 150 mg)   famotidine 20 MG tablet Commonly known as: PEPCID TAKE 1 TABLET (20 MG  TOTAL) BY MOUTH DAILY. FOR HEARTBURN.   gabapentin 300 MG capsule Commonly known as: NEURONTIN Take 1 capsule (300 mg total) by mouth at bedtime. For back pain.   multivitamin tablet Take 1 tablet by mouth daily.   Nuplazid 34 MG Caps Generic drug: Pimavanserin Tartrate Take 1 capsule by mouth daily.   OVER THE COUNTER MEDICATION [employer] vitamin   OVER THE COUNTER MEDICATION 2 tablets daily. Air Shield   pantoprazole 40 MG tablet Commonly known as: PROTONIX TAKE 1 TABLET BY MOUTH EVERY DAY   simvastatin 20 MG tablet Commonly known as: ZOCOR TAKE 1 TABLET BY MOUTH EVERYDAY AT BEDTIME   spironolactone 25 MG tablet Commonly known as: ALDACTONE Take 1 tablet (25 mg total) by mouth daily.   SUMAtriptan 100 MG tablet Commonly known as: IMITREX Take 100 mg by mouth as needed.   tamsulosin 0.4 MG Caps capsule Commonly known as: FLOMAX Take 0.4 mg by mouth daily.   topiramate 50 MG tablet Commonly known as: TOPAMAX Take 50 mg by mouth daily.           Objective:   Physical Exam BP 130/68   Temp 98.2 F (36.8 C) Comment: 1000.2 'sunday 8/7 This is a virtual video visit, patient is alert oriented x3, in no distress.  Speaking in complete sentences, no cough    Assessment     70'$  year old female, PMH includes high cholesterol, RLS, dementia, Parkinson, depression, obesity, prediabetes, presents with:  COVID-19: Symptoms a started 02/21/2021, tested positive the next day. She had moderate symptoms but now they are getting better. Patient tells me that she had a total of 4 COVID vaccines The patient has multiple medical problems, reportedly all well controlled, Parkinson is not exacerbated. I recommend oral medications. Plan: Molnupiravir Rest, fluids, Tylenol, Mucinex DM as needed. Isolate x10 days Definitely call if not gradually better, Red flag symptoms discussed: Severe cough, chest pain, difficulty breathing. She verbalized understanding.      I  discussed the assessment and treatment plan with the patient. The patient was provided an opportunity to ask questions and all were answered. The patient agreed with the plan and demonstrated an understanding of the instructions.   The patient was advised to call back or seek an in-person evaluation if the symptoms worsen or if the condition fails to improve as anticipated.

## 2021-02-27 ENCOUNTER — Other Ambulatory Visit: Payer: Self-pay

## 2021-02-27 ENCOUNTER — Ambulatory Visit
Admission: RE | Admit: 2021-02-27 | Discharge: 2021-02-27 | Disposition: A | Discharge status: Home / Self Care | Payer: Medicare Other | Source: Ambulatory Visit | Attending: Emergency Medicine | Admitting: Emergency Medicine

## 2021-02-27 ENCOUNTER — Telehealth: Payer: Self-pay | Admitting: *Deleted

## 2021-02-27 VITALS — BP 158/90 | HR 106 | Temp 97.5°F | Resp 18

## 2021-02-27 DIAGNOSIS — N390 Urinary tract infection, site not specified: Secondary | ICD-10-CM | POA: Diagnosis not present

## 2021-02-27 LAB — POCT URINALYSIS DIP (MANUAL ENTRY)
Bilirubin, UA: NEGATIVE
Glucose, UA: NEGATIVE mg/dL
Ketones, POC UA: NEGATIVE mg/dL
Nitrite, UA: NEGATIVE
Protein Ur, POC: 30 mg/dL — AB
Spec Grav, UA: 1.025 (ref 1.010–1.025)
Urobilinogen, UA: 0.2 E.U./dL
pH, UA: 5.5 (ref 5.0–8.0)

## 2021-02-27 MED ORDER — CEPHALEXIN 500 MG PO CAPS: 500.0000 mg | ORAL_CAPSULE | Freq: Two times a day (BID) | ORAL | 0 refills | Status: AC

## 2021-02-27 NOTE — Discharge Instructions (Addendum)

## 2021-02-27 NOTE — Telephone Encounter (Signed)
Spoke to patient by telephone and was advised that she has covid and a UTI. Patient requested just dropping off a urine. Patient was advised that she would need a virtual visit. Patient was advised that we do not have anything available at the office today. Patient was advised that her urine would need to be checked to confirm that she has a UTI. Patient was advised that a culture would probably be ordered also. Patient was given information on the [facility]. Patient stated that she plans on heading to the UC shortly.

## 2021-02-27 NOTE — ED Triage Notes (Signed)
Patient c/o dysuria and urinary frequency x 1 day.   Patient denies back pain and fever. Patient denies N/V/D.   Patient endorses "dark urine". Patient endorses ABD pressure.   Patient is COVID POSITIVE.   History of Parkinson's Disease.   Patient is taking FloMax and Molnupivar (for COVID).

## 2021-02-27 NOTE — Telephone Encounter (Signed)
Agree with this. Thanks.  

## 2021-02-27 NOTE — Telephone Encounter (Signed)
PLEASE NOTE: All timestamps contained within this report are represented as [place] Standard Time. CONFIDENTIALTY NOTICE: This fax transmission is intended only for the addressee. It contains information that is legally privileged, confidential or otherwise protected from use or disclosure. If you are not the intended recipient, you are strictly prohibited from reviewing, disclosing, copying using or disseminating any of this information or taking any action in reliance on or regarding this information. If you have received this fax in error, please notify us immediately by telephone so that we can arrange for its return to [place]. Phone: [phone], Toll-Free: [phone], Fax: [phone] Page: 1 of 2 Call Id: [id number] [facility] - Client TELEPHONE ADVICE RECORD [facility] Patient Name: Katherine [name] Gender: Female DOB: 04-24-1951 Age: 70 Y 84 M 18 D Return Phone Number: [phone] (Alternate) Address: City/ State/ Zip: [place] [address] Client [facility] - Client Client Site [facility] - Day Physician Alma Friendly - NP Contact Type Call Who Is Calling Patient / Member / Family / Caregiver Call Type Triage / Clinical Relationship To Patient Self Return Phone Number [phone] (Alternate) Chief Complaint Urination Pain Reason for Call Symptomatic / Request for Health Information Initial Comment Has covid and a UTI. Has constant urination burning. Translation No Nurse Assessment Nurse: Doyle Askew, RN, Beth Date/Time (Eastern Time): 02/27/2021 11:04:04 AM Confirm and document reason for call. If symptomatic, describe symptoms. ---Caller states she has covid (tested positive Sunday at drugstore drive [place]) and now states she feels like she has a UTI - has constant urination burning. Caller was put on antiviral for Covid and states she is feeling better. Does the patient have any new or  worsening symptoms? ---Yes Will a triage be completed? ---Yes Related visit to physician within the last 2 weeks? ---No Does the PT have any chronic conditions? (i.e. diabetes, asthma, this includes High risk factors for pregnancy, etc.) ---Yes List chronic conditions. ---PD, HTN Is this a behavioral health or substance abuse call? ---No Guidelines Guideline Title Affirmed Question Affirmed Notes Nurse Date/Time (Eastern Time) Urinary Symptoms [1] Unable to urinate (or only a few drops) > 4 hours AND [2] bladder feels very full (e.g., palpable bladder or strong urge to urinate) Doyle Askew, RN, [facility] 02/27/2021 11:06:50 AM PLEASE NOTE: All timestamps contained within this report are represented as [place] Standard Time. CONFIDENTIALTY NOTICE: This fax transmission is intended only for the addressee. It contains information that is legally privileged, confidential or otherwise protected from use or disclosure. If you are not the intended recipient, you are strictly prohibited from reviewing, disclosing, copying using or disseminating any of this information or taking any action in reliance on or regarding this information. If you have received this fax in error, please notify us immediately by telephone so that we can arrange for its return to [place]. Phone: [phone], Toll-Free: [phone], Fax: [phone] Page: 2 of 2 Call Id: [id number] [place]. Time Eilene Ghazi Time) Disposition Final User 02/27/2021 10:59:45 AM Attempt made - message left Doyle Askew RN, Beth 02/27/2021 11:11:07 AM Go to ED Now Yes Doyle Askew, RN, Beth Caller Disagree/Comply Comply Caller Understands Yes PreDisposition Call Doctor Care Advice Given Per Guideline GO TO ED NOW: * You need to be seen in the Emergency Department. * Go to the ED at ___________ [place] now. Drive carefully. CARE ADVICE given per Urinary Symptoms (Adult) guideline. Comments User: Melene Muller, RN Date/Time Eilene Ghazi Time): 02/27/2021 11:12:49  AM Tried to call backline to  notify of ED dispostion and caller unsure that she might go - no answer Referrals [place] UNDECIDED REFERRED TO PCP [facility] - ED

## 2021-02-27 NOTE — Telephone Encounter (Signed)
Recommend in-person evaluation for UTI symptoms at local [facility] given COVID+. She also has h/o parkinson with LBD

## 2021-02-27 NOTE — Telephone Encounter (Signed)
Dr Darnell Level, can you advise on how to proceed the best in this situation? She is still not able to come in to the office due to [place].

## 2021-02-27 NOTE — Telephone Encounter (Signed)
Spoke to patient by telephone and she was given information on the [facility]. Patient stated that she is sure that she has a UTI. Patient stated that she is going to get dressed and head to the UC now.

## 2021-02-27 NOTE — ED Provider Notes (Signed)
Subjective:    Katherine [name] is a very pleasant 70 y.o. female who presents with concerns for UTI due to dysuria and frequency which started today.  Patient reports dark-colored urine and lower abdominal pressure.  She reports that she is COVID-positive. No unilateral back pain, vomiting, fever, vaginal discharge, concern for STD.  Past medical history, past surgical history, current medications reviewed.  Allergies: is allergic to dilaudid [hydromorphone], codeine, darvon, hydrocodone, meperidine, oxycodone, tramadol, and victoza [liraglutide].  Review of Systems See HPI   Objective:     Vitals:   02/27/21 1235  BP: (!) 158/90  Pulse: (!) 106  Resp: 18  Temp: (!) 97.5 F (36.4 C)  SpO2: 97%     General: Appears well-developed and well-nourished. No acute distress.  Cardiovascular: Normal rate . Regular rhythm; no murmurs, gallops, or rubs.  Pulm/Chest: No respiratory distress. Breath sounds normal bilaterally without wheezes, rhonchi, or rales.  Abdominal: No CVAT.  Neurological: Alert and oriented to person, place, and time.  Skin: Skin is warm and dry.  Psychiatric: Normal mood, affect, behavior, and thought content.  GU:  Deferred secondary to self collect specimen  Laboratory:  Orders Placed This Encounter  Procedures   Urine Culture   POCT urinalysis dipstick   Results for orders placed or performed during the hospital encounter of 02/27/21  POCT urinalysis dipstick  Result Value Ref Range   Color, UA yellow yellow   Clarity, UA cloudy (A) clear   Glucose, UA negative negative mg/dL   Bilirubin, UA negative negative   Ketones, POC UA negative negative mg/dL   Spec Grav, UA 1.025 1.010 - 1.025   Blood, UA small (A) negative   pH, UA 5.5 5.0 - 8.0   Protein Ur, POC =30 (A) negative mg/dL   Urobilinogen, UA 0.2 0.2 or 1.0 E.U./dL   Nitrite, UA Negative Negative   Leukocytes, UA Small (1+) (A) Negative    -Urinalysis reveals cloudy urine, small blood,  small leukocytes and 30 protein -Urine culture pending Assessment:   1. Acute UTI - Urine Culture; Standing - Urine Culture - cephALEXin (KEFLEX) 500 MG capsule; Take 1 capsule (500 mg total) by mouth 2 (two) times daily for 7 days.  Dispense: 14 capsule; Refill: 0   Plan:   MDM: Patient presents with concerns for UTI due to dysuria and frequency which started today.  Patient reports dark-colored urine and lower abdominal pressure.  She reports that she is COVID-positive. No unilateral back pain, vomiting, fever, vaginal discharge, concern for STD.  Chart review completed.  Urinalysis reveals cloudy urine, small blood, small leukocytes and 30 protein.  Urine culture pending.  Given symptoms along with urinalysis findings, likely acute UTI.  Did complete a chart review to look at the patient's last + urine culture which revealed E. coli and is pansensitive.  Rx Keflex to the patient's preferred pharmacy and advised to push fluids.  Also advised that we would have her urine culture back in about the next 2 to 3 days and if any abnormal results appear we will change her antibiotic as needed.  Advised of strict return/emergency department precautions for fever, unilateral back pain or vomiting.  No suspicion today for complicated UTI or pyelonephritis.  Patient verbalized understanding and agreed with plan.  Patient stable upon discharge.  Return as needed.    Discharge Instructions      You were seen today for an infection in the lower urinary tract. Your urine sample today was sent for a  culture. The urine culture will show what type of bacteria grows and if you are on the appropriate antibiotic. If the antibiotic needs to be changed, you will receive a phone call from the follow up nurse who will give you more information. If you do not receive a call, then you are on the correct antibiotic.  Take antibiotics as directed. Finish course even if feeling better sooner. Drink plenty of clear  fluids. Over the counter "Uristat" or "Azo Standard" may help your discomfort.  This will turn your urine dark orange or red and may stain contacts (remove before using). You may experience 24 - 48 hours of continuing discomfort until medication controls the infection.  Return to clinic or go to the ER if you develop a fever, one-sided back pain, or vomiting as these are signs of a worsening infection.         Serafina Royals, [place] 02/27/21 1303

## 2021-03-01 LAB — URINE CULTURE: Culture: >=100000 — AB

## 2021-03-09 ENCOUNTER — Telehealth: Payer: Self-pay | Admitting: Primary Care

## 2021-03-09 DIAGNOSIS — R300 Dysuria: Secondary | ICD-10-CM

## 2021-03-09 NOTE — Telephone Encounter (Signed)
Thanks, FYI.

## 2021-03-09 NOTE — Addendum Note (Signed)
Addended by: Lesleigh Noe on: 03/09/2021 12:54 PM   Modules accepted: Orders

## 2021-03-09 NOTE — Telephone Encounter (Signed)
Generally speaking the only reason for earlier testing would be if she is planning/wanting to take Azo. Otherwise could wait for UA during visit tomorrow.   I've order a UA and can order culture if needed as long as MA available for specimen collection/processing.   Routing to MA for review, otherwise clinic policy is labs same day

## 2021-03-09 NOTE — Telephone Encounter (Signed)
Mrs. Katherine [name] called in and stated that has a UTI and her back is hurting from it and the pain is more inside than outside and her urine has a bad odor to it and wanted to know if she can do a UA today and see Anda Kraft tomorrow

## 2021-03-09 NOTE — Telephone Encounter (Signed)
Patient has seen second message that I replied to to let her know that we need to see her in the office to check urine. Did not see this message until after replied to last one. Please advise.

## 2021-03-09 NOTE — Telephone Encounter (Signed)
My chart message sent to patient from Katherine [name] today letting her know we will address at visit tomorrow. No further action needed at this time.

## 2021-03-10 ENCOUNTER — Encounter: Payer: Self-pay | Admitting: Primary Care

## 2021-03-10 ENCOUNTER — Ambulatory Visit (INDEPENDENT_AMBULATORY_CARE_PROVIDER_SITE_OTHER): Payer: Medicare Other | Admitting: Primary Care

## 2021-03-10 ENCOUNTER — Other Ambulatory Visit: Payer: Self-pay

## 2021-03-10 VITALS — BP 146/88 | HR 98 | Temp 98.6°F | Ht 62.0 in | Wt 184.0 lb

## 2021-03-10 DIAGNOSIS — R296 Repeated falls: Secondary | ICD-10-CM | POA: Diagnosis not present

## 2021-03-10 DIAGNOSIS — R2689 Other abnormalities of gait and mobility: Secondary | ICD-10-CM | POA: Insufficient documentation

## 2021-03-10 DIAGNOSIS — G3183 Dementia with Lewy bodies: Secondary | ICD-10-CM | POA: Diagnosis not present

## 2021-03-10 DIAGNOSIS — E782 Mixed hyperlipidemia: Secondary | ICD-10-CM | POA: Diagnosis not present

## 2021-03-10 DIAGNOSIS — R7303 Prediabetes: Secondary | ICD-10-CM | POA: Diagnosis not present

## 2021-03-10 DIAGNOSIS — E559 Vitamin D deficiency, unspecified: Secondary | ICD-10-CM

## 2021-03-10 DIAGNOSIS — E538 Deficiency of other specified B group vitamins: Secondary | ICD-10-CM | POA: Diagnosis not present

## 2021-03-10 DIAGNOSIS — F0281 Dementia in other diseases classified elsewhere with behavioral disturbance: Secondary | ICD-10-CM

## 2021-03-10 DIAGNOSIS — R300 Dysuria: Secondary | ICD-10-CM | POA: Insufficient documentation

## 2021-03-10 DIAGNOSIS — F02818 Dementia in other diseases classified elsewhere, unspecified severity, with other behavioral disturbance: Secondary | ICD-10-CM

## 2021-03-10 DIAGNOSIS — D649 Anemia, unspecified: Secondary | ICD-10-CM | POA: Diagnosis not present

## 2021-03-10 DIAGNOSIS — G20 Parkinson's disease: Secondary | ICD-10-CM | POA: Diagnosis not present

## 2021-03-10 LAB — CBC
HCT: 38.4 % (ref 36.0–46.0)
Hemoglobin: 12.1 g/dL (ref 12.0–15.0)
MCHC: 31.7 g/dL (ref 30.0–36.0)
MCV: 90 fl (ref 78.0–100.0)
Platelets: 324 10*3/uL (ref 150.0–400.0)
RBC: 4.26 Mil/uL (ref 3.87–5.11)
RDW: 15.3 % (ref 11.5–15.5)
WBC: 9.6 10*3/uL (ref 4.0–10.5)

## 2021-03-10 LAB — POC URINALSYSI DIPSTICK (AUTOMATED)
Blood, UA: NEGATIVE
Glucose, UA: NEGATIVE
Nitrite, UA: NEGATIVE
Protein, UA: POSITIVE — AB
Spec Grav, UA: 1.02 (ref 1.010–1.025)
Urobilinogen, UA: 0.2 E.U./dL
pH, UA: 5.5 (ref 5.0–8.0)

## 2021-03-10 LAB — LDL CHOLESTEROL, DIRECT: Direct LDL: 143 mg/dL

## 2021-03-10 LAB — VITAMIN B12: Vitamin B-12: 530 pg/mL (ref 211–911)

## 2021-03-10 LAB — LIPID PANEL
Cholesterol: 211 mg/dL — ABNORMAL HIGH (ref 0–200)
HDL: 57.4 mg/dL (ref >39.00)
NonHDL: 153.76
Total CHOL/HDL Ratio: 4
Triglycerides: 266 mg/dL — ABNORMAL HIGH (ref 0.0–149.0)
VLDL: 53.2 mg/dL — ABNORMAL HIGH (ref 0.0–40.0)

## 2021-03-10 LAB — IBC + FERRITIN
Ferritin: 10.7 ng/mL (ref 10.0–291.0)
Iron: 101 ug/dL (ref 42–145)
Saturation Ratios: 26 % (ref 20.0–50.0)
TIBC: 387.8 ug/dL (ref 250.0–450.0)
Transferrin: 277 mg/dL (ref 212.0–360.0)

## 2021-03-10 LAB — VITAMIN D 25 HYDROXY (VIT D DEFICIENCY, FRACTURES): VITD: 21.57 ng/mL — ABNORMAL LOW (ref 30.00–100.00)

## 2021-03-10 LAB — HEMOGLOBIN A1C: Hgb A1c MFr Bld: 6.2 % (ref 4.6–6.5)

## 2021-03-10 NOTE — Patient Instructions (Signed)
Stop by the lab prior to leaving today. I will notify you of your results once received.   You will be contacted regarding your referral to home health physical therapy.  Please let us know if you have not been contacted within two weeks.   It was a pleasure to see you today!

## 2021-03-10 NOTE — Assessment & Plan Note (Signed)
Repeat vitamin D level pending.

## 2021-03-10 NOTE — Assessment & Plan Note (Signed)
Not currently on treatment, not taking simvastatin, repeat lipid panel pending.

## 2021-03-10 NOTE — Assessment & Plan Note (Signed)
Treated with Keflex 500 mg BID x 7 days for E coli positive culture.  Repeat UA today with 1+ leuks, otherwise negative. Culture sent off and pending.

## 2021-03-10 NOTE — Assessment & Plan Note (Signed)
Not taking oral iron, history of iron infusions. Repeat iron levels pending.

## 2021-03-10 NOTE — Assessment & Plan Note (Signed)
Repeat A1C level pending.

## 2021-03-10 NOTE — Assessment & Plan Note (Signed)
Two falls within the last month. Compliant to prescribed regimen. Continue Nuplazid 34 mg. Following with neurology.  Referral placed to home health due to increased falls. She and husband agree.

## 2021-03-10 NOTE — Progress Notes (Signed)
Subjective:    Patient ID: Katherine [name], female    DOB: June 30, 1951, 70 y.o.   MRN: [id number]  HPI  Katherine [name] is a very pleasant 70 y.o. female with a history of recurrent UTI, Parkinson's Disease, Lewy Body Dementia, iron deficiency anemia, chronic fatigue who presents today for urgent care follow up and general follow up. Her husband  1) Cystitis: Evaluated at Urgent Care on 02/27/21 with symptoms of dysuria and frequency which began the day prior. UA with Culture collected, treated with Keflex 500 mg BID x 7 days. Culture returned positive for E coli, sensitive to cephalosporins.   Treated virtually for Covid-19 infection on 02/24/21 per Dr. Larose Kells with molnupiravir x 5 days. Patient completed this course.   She sent a message through the MyChart portal on 03/08/21 endorsing "awful symptoms of UTI" so she was asked to come in today for repeat testing.   Today she endorses lower back pain, burning after urination, but overall feeling better since antibiotic treatment. She completed the molnupiravir.   2) Prediabetes: Last A1C of 6.3 in September 2021.   3) Lewy Body Dementia/Parkinson's Disease: Following with Neurology through [facility], last visit was in July 2022. Patient privately denied hallucinations and delusions, endorsed feeling doing well on Nuplazid. She also endorsed having videos and photos with evidence that her husband was having a four year affair with a teacher at his prior school. He is a retired Programmer, multimedia. The patient sent a my chart message to our office yesterday endorsing the same.   During her visit in July 2022 it was decided to continue pimavanserin 34 mg nightly as she is at max dose. Quetiapine was ineffective, rivastigmine and levodopa increased hallucinations. It was recommended that she and her husband speak with PCP or psychiatrist to try "stronger antipsychotics".   Today she and her husband endorse a few falls at home within the last one month.   She has noticed being more unsteady. She is not driving as her husband is taking her most places. She completed physical therapy about one year ago, did well with this. She feels that she's doing very well with her Lewy Body Dementia and Parkinson's.   4) Iron Deficiency Anemia: Chronic with history of iron infusions. She is not taking any oral iron. She endorses continued and chronic fatigue.   Her husband is checking her BP at home daily which running 120's/80's.   BP Readings from Last 3 Encounters:  03/10/21 (!) 146/88  02/27/21 (!) 158/90  02/24/21 130/68     Review of Systems  Constitutional:  Positive for fatigue.  Respiratory:  Negative for shortness of breath.   Cardiovascular:  Negative for chest pain.  Genitourinary:  Positive for dysuria. Negative for frequency and vaginal discharge.  Musculoskeletal:  Positive for back pain.  Psychiatric/Behavioral:         Patient denies hallucinations, husband endorses frequent delusions and hallucinations        Past Medical History:  Diagnosis Date   Anemia 2018   Anxiety    Arthritis    Borderline diabetes    Chest pain    a. 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 [facility]   Depression    Dyspnea on exertion    a. 03/2009 Echo: EF 65%, no rwma, triv TR.  GERD (gastroesophageal reflux disease)    High cholesterol    Hypertension    Hypokalemia    Migraine headache    Obesity    Parkinson's disease ([place])    Pre-diabetes    okay now     Social History   Socioeconomic History   Marital status: Married    Spouse name: Not on file   Number of children: Not on file   Years of education: Not on file   Highest education level: Not on file  Occupational History   Occupation: Product manager: [employer]  Tobacco Use   Smoking status:  Never   Smokeless tobacco: Never  Vaping Use   Vaping Use: Never used  Substance and Sexual Activity   Alcohol use: No   Drug use: No   Sexual activity: Yes    Partners: Male    Birth control/protection: Post-menopausal  Other Topics Concern   Not on file  Social History Narrative   Married.   One son is 34, lives in [place].   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   Enjoys substitute teaching, gardening, cooking.   Social Determinants of Health   Financial Resource Strain: Not on file  Food Insecurity: Not on file  Transportation Needs: Not on file  Physical Activity: Not on file  Stress: Not on file  Social Connections: Not on file  Intimate Partner Violence: Not on file    Past Surgical History:  Procedure Laterality Date   BLADDER SUSPENSION  2005   [place] N/A 01/11/2020   Procedure: CLOSED REDUCTION NASAL FRACTURE;  Surgeon: Stephana Gust, MD;  Location: [place];  Service: ENT;  Laterality: N/A;   ENDOMETRIAL ABLATION  2007   FOOT SURGERY     bilateral bunions   LUMBAR LAMINECTOMY/DECOMPRESSION MICRODISCECTOMY Bilateral 01/12/2016   Procedure: Bilateral L4-5 Laminotomy/Foraminotomy;  Surgeon: Newman Pies, MD;  Location: [place] [facility];  Service: Neurosurgery;  Laterality: Bilateral;  Bilateral L4-5 Laminotomy/Foraminotomy   TONSILLECTOMY  1971    Family History  Problem Relation Age of Onset   Heart disease Mother    Hypertension Mother    Heart failure Mother        CHF   Diabetes Mother    Cancer Mother        CERVICAL CANCER   Heart disease Father    Diabetes Father    Liver disease Father    Hypertension Father    Diabetes Sister    Breast cancer Sister 24   Heart disease Brother    Heart failure Brother        CHF   Diabetes Brother    Cancer Brother 99       THROAT AND NECK   Esophageal cancer Brother    Cancer Maternal Grandfather    Colon cancer Neg Hx     Stomach cancer Neg Hx    Rectal cancer Neg Hx     Allergies  Allergen Reactions   Dilaudid [Hydromorphone]     hallucinations   Codeine Anxiety   Darvon Anxiety   Hydrocodone Anxiety   Meperidine Anxiety   Oxycodone Anxiety   Tramadol Anxiety   Victoza [Liraglutide] Nausea Only and Other (See Comments)    And weakness    Current Outpatient Medications on File Prior to Visit  Medication Sig Dispense Refill   acetaminophen (TYLENOL) 325 MG tablet Take 650 mg by mouth every 6 (six)  hours as needed.     buPROPion (WELLBUTRIN XL) 150 MG 24 hr tablet Take 1 tablet by mouth every morning.     diazepam (VALIUM) 10 MG tablet Take 10 mg by mouth at bedtime.  2   DULoxetine (CYMBALTA) 30 MG capsule Take 30 mg by mouth daily. (Total dose = 150 mg)     DULoxetine (CYMBALTA) 60 MG capsule Take 120 mg by mouth every morning. (Total dose = 150 mg)     famotidine (PEPCID) 20 MG tablet TAKE 1 TABLET (20 MG TOTAL) BY MOUTH DAILY. FOR HEARTBURN. 90 tablet 1   gabapentin (NEURONTIN) 300 MG capsule Take 1 capsule (300 mg total) by mouth at bedtime. For back pain. 90 capsule 0   Multiple Vitamin (MULTIVITAMIN) tablet Take 1 tablet by mouth daily.     NUPLAZID 34 MG CAPS Take 1 capsule by mouth daily.     OVER THE COUNTER MEDICATION [employer] vitamin     OVER THE COUNTER MEDICATION 2 tablets daily. Air [employer]     pantoprazole (PROTONIX) 40 MG tablet TAKE 1 TABLET BY MOUTH EVERY DAY 90 tablet 1   spironolactone (ALDACTONE) 25 MG tablet Take 1 tablet (25 mg total) by mouth daily. 90 tablet 3   SUMAtriptan (IMITREX) 100 MG tablet Take 100 mg by mouth as needed.     tamsulosin (FLOMAX) 0.4 MG CAPS capsule Take 0.4 mg by mouth daily.     topiramate (TOPAMAX) 50 MG tablet Take 50 mg by mouth daily.      No current facility-administered medications on file prior to visit.    BP (!) 146/88   Pulse 98   Temp 98.6 F (37 C) (Temporal)   Ht '5\' 2"'$  (1.575 m)   Wt 184 lb (83.5 kg)   SpO2 98%   BMI 33.65  kg/m  Objective:   Physical Exam Constitutional:      Appearance: Normal appearance.  Cardiovascular:     Rate and Rhythm: Normal rate and regular rhythm.  Pulmonary:     Effort: Pulmonary effort is normal.     Breath sounds: Normal breath sounds.  Abdominal:     Tenderness: There is no right CVA tenderness or left CVA tenderness.  Musculoskeletal:     Cervical back: Neck supple.  Skin:    General: Skin is warm and dry.  Neurological:     Mental Status: She is alert.     Comments: Answering some questions appropriately but counts on husband to answer most question during HPI.  Slightly unsteady gait noted during exam. Leaning forward with ambulation. No shuffling.   Psychiatric:        Mood and Affect: Mood normal.          Assessment & Plan:      This visit occurred during the SARS-CoV-2 public health emergency.  Safety protocols were in place, including screening questions prior to the visit, additional usage of staff PPE, and extensive cleaning of exam room while observing appropriate contact time as indicated for disinfecting solutions.

## 2021-03-10 NOTE — Assessment & Plan Note (Signed)
Repeat B12 level pending.

## 2021-03-10 NOTE — Assessment & Plan Note (Signed)
Deteriorated according to both husband and neurology notes from July 2022.  Husband endorses there's nothing much left neurology can do, also that Nuplazid stopped working a long time ago.   Plan is to continue Nuplazid 34 mg as the patient believes this is helping. Her husband drives mostly. She appears well cared for. Her husband will continue to keep her at home for the time being. Asked husband to reach out if needing resources.

## 2021-03-10 NOTE — Assessment & Plan Note (Signed)
Secondary to Parkinson's Disease. Referral placed for home health PT.

## 2021-03-11 ENCOUNTER — Ambulatory Visit: Payer: Medicare Other | Admitting: Primary Care

## 2021-03-11 LAB — URINE CULTURE
MICRO NUMBER:: 12279607
SPECIMEN QUALITY:: ADEQUATE

## 2021-03-15 ENCOUNTER — Other Ambulatory Visit: Payer: Self-pay | Admitting: Primary Care

## 2021-03-15 DIAGNOSIS — K219 Gastro-esophageal reflux disease without esophagitis: Secondary | ICD-10-CM

## 2021-03-25 ENCOUNTER — Ambulatory Visit: Payer: Medicare Other

## 2021-03-27 DIAGNOSIS — R3914 Feeling of incomplete bladder emptying: Secondary | ICD-10-CM | POA: Diagnosis not present

## 2021-03-27 DIAGNOSIS — R3911 Hesitancy of micturition: Secondary | ICD-10-CM | POA: Diagnosis not present

## 2021-04-06 DIAGNOSIS — H524 Presbyopia: Secondary | ICD-10-CM | POA: Diagnosis not present

## 2021-04-06 DIAGNOSIS — H35033 Hypertensive retinopathy, bilateral: Secondary | ICD-10-CM | POA: Diagnosis not present

## 2021-04-06 DIAGNOSIS — H52223 Regular astigmatism, bilateral: Secondary | ICD-10-CM | POA: Diagnosis not present

## 2021-04-06 DIAGNOSIS — H5213 Myopia, bilateral: Secondary | ICD-10-CM | POA: Diagnosis not present

## 2021-04-06 DIAGNOSIS — H43812 Vitreous degeneration, left eye: Secondary | ICD-10-CM | POA: Diagnosis not present

## 2021-04-06 DIAGNOSIS — H04123 Dry eye syndrome of bilateral lacrimal glands: Secondary | ICD-10-CM | POA: Diagnosis not present

## 2021-04-06 DIAGNOSIS — H2513 Age-related nuclear cataract, bilateral: Secondary | ICD-10-CM | POA: Diagnosis not present

## 2021-04-08 ENCOUNTER — Other Ambulatory Visit: Payer: Self-pay | Admitting: Primary Care

## 2021-04-08 ENCOUNTER — Telehealth: Payer: Self-pay | Admitting: Primary Care

## 2021-04-08 DIAGNOSIS — M4316 Spondylolisthesis, lumbar region: Secondary | ICD-10-CM

## 2021-04-08 NOTE — Telephone Encounter (Signed)
Paper  work for [employer]  Type of forms received:disability park placard  Routed YE:[id number]  Paperwork received by : terrill   Individual made aware of 3-5 business day turn around (Y/N):y  Form completed and patient made aware of charges(Y/N):y   Faxed to :   Form location:  in box

## 2021-04-08 NOTE — Telephone Encounter (Signed)
Placed in your box for review.  °

## 2021-04-09 NOTE — Telephone Encounter (Signed)
Completed and placed in Joellen's inbox. 

## 2021-04-09 NOTE — Telephone Encounter (Signed)
Called patient per her request will put in the mail today. She will call if any issues. Patient also asked about refill on Gabapenten. Let know it was sent in yesterday if any issues let our office know.

## 2021-04-12 DIAGNOSIS — N3090 Cystitis, unspecified without hematuria: Secondary | ICD-10-CM

## 2021-04-12 DIAGNOSIS — R1900 Intra-abdominal and pelvic swelling, mass and lump, unspecified site: Secondary | ICD-10-CM

## 2021-04-12 DIAGNOSIS — N898 Other specified noninflammatory disorders of vagina: Secondary | ICD-10-CM

## 2021-04-16 ENCOUNTER — Ambulatory Visit: Payer: Medicare Other | Admitting: Primary Care

## 2021-04-20 ENCOUNTER — Encounter: Payer: Self-pay | Admitting: Obstetrics and Gynecology

## 2021-04-20 ENCOUNTER — Ambulatory Visit: Payer: Medicare Other | Admitting: Cardiovascular Disease

## 2021-04-21 ENCOUNTER — Telehealth: Payer: Self-pay | Admitting: Primary Care

## 2021-04-21 DIAGNOSIS — Z23 Encounter for immunization: Secondary | ICD-10-CM | POA: Diagnosis not present

## 2021-04-21 NOTE — Telephone Encounter (Signed)
Pt called stating that she would like to get her Prevnar shot with another provider. Pt states she cant drive the distance to [facility]

## 2021-04-21 NOTE — Telephone Encounter (Signed)
Is she due for any we will need verbal order then I can call to set up appointment.

## 2021-04-22 NOTE — Telephone Encounter (Signed)
Called patient and husband numbers not able to reach. Left message to return call to our office. I have sent the following questions in my chart to get response.

## 2021-04-22 NOTE — Telephone Encounter (Signed)
I do not see any documentation of patient having a prior Prevnar pneumonia vaccine.  I recommend the patient contact her insurance company (and pharmacy) to ensure she's never been billed for any Prevnar vaccine. If she has NOT had Prevnar, then okay to provide Prevnar 20. If she HAS had Prevnar, then we need to add the date to our EMR and she does NOT need additional pneumonia vaccines.

## 2021-04-23 NOTE — Telephone Encounter (Signed)
Katherine [name] from [facility] medical review call in with questions and concern about Pt paperwork . Would like a call back  #[phone] any time between 8 am - 5 pm

## 2021-04-24 NOTE — Telephone Encounter (Signed)
Called and spoke to  Katherine [name], she was questioning the form that was sent in giving approval to drive. The last form that we have in chart was not form same date that she was questioning. I have requested her fax over to our office for review. Will hold message until fax has been received.

## 2021-04-24 NOTE — Telephone Encounter (Signed)
Pt returned call. Would like a call back # [phone]

## 2021-04-28 ENCOUNTER — Telehealth: Payer: Self-pay | Admitting: Primary Care

## 2021-04-28 NOTE — Telephone Encounter (Signed)
Pt called in stating that she wants to get pneumonia shot. [facility] went to [facility] to get [facility] but they said it would be 270 to get shot done there but the insurance would cover it if pt went to doctor office. Pt want to see if she can get it done in [place] location. [phone]

## 2021-04-30 DIAGNOSIS — Z23 Encounter for immunization: Secondary | ICD-10-CM | POA: Diagnosis not present

## 2021-04-30 NOTE — Telephone Encounter (Signed)
Pt called in stating she is [facility] to [facility] to get [facility] shot and covid booster today. She is wondering if she is due for her Hep B shot and if so, where she needs to get it. Please advise. Pt can be reached at [phone]

## 2021-05-01 NOTE — Telephone Encounter (Signed)
Called patient she received vaccinations at pharmacy. Will have them sent information so that we can update chart. She had questions about hep B if needed and follow up. I have made her an office visit in November to review in office with Anda Kraft as requested by patient. She did not want to wait until we are in our office to do follow up. Will call If has any acute issues before that needs to be addressed.

## 2021-05-01 NOTE — Telephone Encounter (Signed)
Spoke to patient all information has been documented on duplicate call  for patient.

## 2021-05-11 ENCOUNTER — Other Ambulatory Visit: Payer: Self-pay

## 2021-05-11 ENCOUNTER — Encounter: Payer: Self-pay | Admitting: Emergency Medicine

## 2021-05-11 ENCOUNTER — Ambulatory Visit
Admission: EM | Admit: 2021-05-11 | Discharge: 2021-05-11 | Disposition: A | Discharge status: Home / Self Care | Payer: Medicare Other | Attending: Internal Medicine | Admitting: Internal Medicine

## 2021-05-11 ENCOUNTER — Telehealth: Payer: Self-pay | Admitting: Primary Care

## 2021-05-11 DIAGNOSIS — N3000 Acute cystitis without hematuria: Secondary | ICD-10-CM | POA: Diagnosis not present

## 2021-05-11 LAB — POCT URINALYSIS DIP (MANUAL ENTRY)
Bilirubin, UA: NEGATIVE
Blood, UA: NEGATIVE
Glucose, UA: NEGATIVE mg/dL
Ketones, POC UA: NEGATIVE mg/dL
Nitrite, UA: NEGATIVE
Protein Ur, POC: NEGATIVE mg/dL
Spec Grav, UA: 1.015 (ref 1.010–1.025)
Urobilinogen, UA: 0.2 E.U./dL
pH, UA: 6 (ref 5.0–8.0)

## 2021-05-11 MED ORDER — CEPHALEXIN 500 MG PO CAPS: 500.0000 mg | ORAL_CAPSULE | Freq: Two times a day (BID) | ORAL | 0 refills | Status: AC

## 2021-05-11 NOTE — Telephone Encounter (Signed)
Agree with evaluation soon given difficulty urinating

## 2021-05-11 NOTE — Telephone Encounter (Signed)
Do you know anything about this? 

## 2021-05-11 NOTE — Telephone Encounter (Signed)
Pt called in stating that she is experiencing a lot of discomfort in private area. Pt stated she may have an yeast infection. Pt is having trouble going to the bathroom, try to go to the bathroom and can't go. Pt feels irration.

## 2021-05-11 NOTE — Discharge Instructions (Addendum)
Please increase oral fluid intake Take medications as prescribed We will call you with recommendations once labs are abnormal Return to urgent care if symptoms worsen

## 2021-05-11 NOTE — Telephone Encounter (Signed)
Pt called stating that the insurance need approval from [facility]  for Hep B.

## 2021-05-11 NOTE — ED Triage Notes (Signed)
Pt c/o dysuria that started this morning.

## 2021-05-11 NOTE — Telephone Encounter (Signed)
Spoke to patient by telephone and was advised that she started with symptoms last night and she has gotten worse as the day has gone on. Patient stated that she is having the worst burning that she has ever had when she tries to urinate. Patient stated that she is unable to urinate. Patient was advised that she should go ahead and have a face to face evaluation tonight. Patient was given information on the [facility]. Patient stated that she will drink lots of water and go ahead and head over to the Urgent Care shortly.

## 2021-05-11 NOTE — ED Provider Notes (Signed)
[facility]    CSN: [id number] Arrival date & time: 05/11/21  1718      History   Chief Complaint Chief Complaint  Patient presents with   Dysuria    HPI ORETTA BERKLAND is a 70 y.o. female comes to the urgent care with 1 day history of dysuria, hesitancy of urination and lower abdominal pain.  Symptom onset was fairly sudden and has been persistent.  She denies any flank pain.  No nausea, vomiting or diarrhea.Marland Kitchen   HPI  Past Medical History:  Diagnosis Date   Anemia 2018   Anxiety    Arthritis    Borderline diabetes    Chest pain    a. 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 [facility]   Depression    Dyspnea on exertion    a. 03/2009 Echo: EF 65%, no rwma, triv TR.   GERD (gastroesophageal reflux disease)    High cholesterol    Hypertension    Hypokalemia    Migraine headache    Obesity    Parkinson's disease ([place])    Pre-diabetes    okay now     Patient Active Problem List   Diagnosis Date Noted   Dysuria 03/10/2021   Vitamin B 12 deficiency 03/10/2021   Recurrent falls 03/10/2021   Hematuria 10/28/2020   Pelvic fullness in female 10/07/2020   Toe pain 08/14/2020   Lewy body dementia ([place]) 04/15/2020   Urinary incontinence 04/15/2020   Recurrent cystitis 02/13/2020   Cough 02/06/2020   Parkinson's disease ([place]) 07/11/2019   Leukocytosis 07/11/2019   Rash and nonspecific skin eruption 02/14/2019   Dizziness 12/14/2018   Osteopenia 07/07/2018   Gastroesophageal reflux disease 03/13/2018   Chronic migraine without aura without status migrainosus, not intractable 03/13/2018   Constipation 02/15/2017   Spondylolisthesis of lumbar region 07/15/2016   Vitamin D deficiency 03/11/2016   Lumbar stenosis with neurogenic claudication 01/12/2016   Notalgia 01/08/2016   Restless leg  syndrome 03/13/2015   Preventative health care 12/10/2014   Other fatigue 11/04/2014   Anemia 11/04/2014   Depression 10/31/2014   Prediabetes 10/31/2014   Obstructive sleep apnea 05/01/2009   Hyperlipidemia 09/04/2008   Obesity 09/04/2008   HYPERTENSION, BENIGN 09/04/2008    Past Surgical History:  Procedure Laterality Date   BLADDER SUSPENSION  2005   CARDIAC CATHETERIZATION     CESAREAN SECTION     CLOSED REDUCTION NASAL FRACTURE N/A 01/11/2020   Procedure: CLOSED REDUCTION NASAL FRACTURE;  Surgeon: Malu Gust, MD;  Location: [place];  Service: ENT;  Laterality: N/A;   ENDOMETRIAL ABLATION  2007   FOOT SURGERY     bilateral bunions   LUMBAR LAMINECTOMY/DECOMPRESSION MICRODISCECTOMY Bilateral 01/12/2016   Procedure: Bilateral L4-5 Laminotomy/Foraminotomy;  Surgeon: Newman Pies, MD;  Location: [place] [facility];  Service: Neurosurgery;  Laterality: Bilateral;  Bilateral L4-5 Laminotomy/Foraminotomy   TONSILLECTOMY  1971    OB History   No obstetric history on file.      Home Medications    Prior to Admission medications   Medication Sig Start Date End Date Taking? Authorizing Provider  cephALEXin (KEFLEX) 500 MG capsule Take 1 capsule (500 mg total) by mouth 2 (two) times daily for 5 days. 05/11/21 05/16/21 Yes Awad Gladd, Myrene Galas, MD  acetaminophen (TYLENOL) 325 MG tablet Take 650 mg  by mouth every 6 (six) hours as needed.    [provider]  buPROPion (WELLBUTRIN XL) 150 MG 24 hr tablet Take 1 tablet by mouth every morning. 10/14/20   [provider]  diazepam (VALIUM) 10 MG tablet Take 10 mg by mouth at bedtime. 02/01/18   [provider]  DULoxetine (CYMBALTA) 30 MG capsule Take 30 mg by mouth daily. (Total dose = 150 mg)    [provider]  DULoxetine (CYMBALTA) 60 MG capsule Take 120 mg by mouth every morning. (Total dose = 150 mg) 07/10/19   [provider]  famotidine (PEPCID) 20 MG tablet TAKE 1 TABLET (20 MG  TOTAL) BY MOUTH DAILY. FOR HEARTBURN. 01/27/21   [name], NP  gabapentin (NEURONTIN) 300 MG capsule TAKE 1 CAPSULE (300 MG TOTAL) BY MOUTH AT BEDTIME. FOR BACK PAIN. 04/08/21   [name], NP  Multiple Vitamin (MULTIVITAMIN) tablet Take 1 tablet by mouth daily.    [provider]  NUPLAZID 34 MG CAPS Take 1 capsule by mouth daily. 04/03/20   [provider]  OVER THE COUNTER MEDICATION [employer] vitamin    [provider]  OVER THE COUNTER MEDICATION 2 tablets daily. [employer], Historical, MD  pantoprazole (PROTONIX) 40 MG tablet Take 1 tablet (40 mg total) by mouth daily. For heartburn. 03/17/21   [name], NP  spironolactone (ALDACTONE) 25 MG tablet Take 1 tablet (25 mg total) by mouth daily. 07/22/20   Minna Merritts, MD  SUMAtriptan (IMITREX) 100 MG tablet Take 100 mg by mouth as needed. 11/14/19 03/10/21  [provider]  tamsulosin (FLOMAX) 0.4 MG CAPS capsule Take 0.4 mg by mouth daily. 11/09/20   [provider]  topiramate (TOPAMAX) 50 MG tablet Take 50 mg by mouth daily.     [provider]    Family History Family History  Problem Relation Age of Onset   Heart disease Mother    Hypertension Mother    Heart failure Mother        CHF   Diabetes Mother    Cancer Mother        CERVICAL CANCER   Heart disease Father    Diabetes Father    Liver disease Father    Hypertension Father    Diabetes Sister    Breast cancer Sister 27   Heart disease Brother    Heart failure Brother        CHF   Diabetes Brother    Cancer Brother 24       THROAT AND NECK   Esophageal cancer Brother    Cancer Maternal Grandfather    Colon cancer Neg Hx    Stomach cancer Neg Hx    Rectal cancer Neg Hx     Social History Social History   Tobacco Use   Smoking status: Never   Smokeless tobacco: Never  Vaping Use   Vaping Use: Never used  Substance Use Topics   Alcohol use: No   Drug use: No      Allergies   Dilaudid [hydromorphone], Codeine, Darvon, Hydrocodone, Meperidine, Oxycodone, Tramadol, and Victoza [liraglutide]   Review of Systems Review of Systems  Gastrointestinal:  Positive for abdominal pain.  Genitourinary:  Positive for difficulty urinating, dysuria, frequency and urgency. Negative for flank pain.    Physical Exam Triage Vital Signs ED Triage Vitals [05/11/21 1759]  Enc Vitals Group     BP 130/79     Pulse  Rate 92     Resp      Temp 97.7 F (36.5 C)     Temp Source Oral     SpO2 98 %     Weight      Height      Head Circumference      Peak Flow      Pain Score      Pain Loc      Pain Edu?      Excl. in [place]?    No data found.  Updated Vital Signs BP 130/79 (BP Location: Left Wrist)   Pulse 92   Temp 97.7 F (36.5 C) (Oral)   SpO2 98%   Visual Acuity Right Eye Distance:   Left Eye Distance:   Bilateral Distance:    Right Eye Near:   Left Eye Near:    Bilateral Near:     Physical Exam Vitals and nursing note reviewed.  Constitutional:      General: She is not in acute distress.    Appearance: She is not ill-appearing.  Cardiovascular:     Rate and Rhythm: Normal rate and regular rhythm.  Pulmonary:     Effort: Pulmonary effort is normal.     Breath sounds: Normal breath sounds.  Abdominal:     General: Bowel sounds are normal.     Palpations: Abdomen is soft.  Neurological:     Mental Status: She is alert.     UC Treatments / Results  Labs (all labs ordered are listed, but only abnormal results are displayed) Labs Reviewed  POCT URINALYSIS DIP (MANUAL ENTRY) - Abnormal; Notable for the following components:      Result Value   Clarity, UA cloudy (*)    Leukocytes, UA Moderate (2+) (*)    All other components within normal limits  URINE CULTURE    EKG   Radiology No results found.  Procedures Procedures (including critical care time)  Medications Ordered in UC Medications - No data to display  Initial  Impression / Assessment and Plan / UC Course  I have reviewed the triage vital signs and the nursing notes.  Pertinent labs & imaging results that were available during my care of the patient were reviewed by me and considered in my medical decision making (see chart for details).     1.  Acute cystitis without hematuria: Point-of-care urinalysis is positive for leukocyte Estrace Keflex 500 mg twice daily x5 days Urine cultures have been sent We will call patient with recommendations if labs are abnormal. Final Clinical Impressions(s) / UC Diagnoses   Final diagnoses:  Acute cystitis without hematuria     Discharge Instructions      Please increase oral fluid intake Take medications as prescribed We will call you with recommendations once labs are abnormal Return to urgent care if symptoms worsen   ED Prescriptions     Medication Sig Dispense Auth. Provider   cephALEXin (KEFLEX) 500 MG capsule Take 1 capsule (500 mg total) by mouth 2 (two) times daily for 5 days. 10 capsule Cyanne Delmar, Myrene Galas, MD      PDMP not reviewed this encounter.   Chase Picket, MD 05/11/21 [phone]

## 2021-05-12 LAB — URINE CULTURE
Culture: 10000 — AB
Special Requests: NORMAL

## 2021-05-12 NOTE — Telephone Encounter (Signed)
Please notify patient that she should be getting a Prevnar 20 pneumonia vaccine.  I know nothing about a hepatitis B vaccine, do not recommend at this time.  Perhaps one of her other providers recommended this?

## 2021-05-13 NOTE — Telephone Encounter (Signed)
Pt returning call

## 2021-05-13 NOTE — Telephone Encounter (Signed)
Left message to return call to our office.  

## 2021-05-15 NOTE — Telephone Encounter (Signed)
Left message to return call to our office.  

## 2021-05-19 DIAGNOSIS — F331 Major depressive disorder, recurrent, moderate: Secondary | ICD-10-CM | POA: Diagnosis not present

## 2021-05-19 DIAGNOSIS — G20 Parkinson's disease: Secondary | ICD-10-CM | POA: Diagnosis not present

## 2021-05-28 ENCOUNTER — Telehealth: Payer: Self-pay | Admitting: Primary Care

## 2021-05-28 DIAGNOSIS — H18413 Arcus senilis, bilateral: Secondary | ICD-10-CM | POA: Diagnosis not present

## 2021-05-28 DIAGNOSIS — H25043 Posterior subcapsular polar age-related cataract, bilateral: Secondary | ICD-10-CM | POA: Diagnosis not present

## 2021-05-28 DIAGNOSIS — H2513 Age-related nuclear cataract, bilateral: Secondary | ICD-10-CM | POA: Diagnosis not present

## 2021-05-28 DIAGNOSIS — H2512 Age-related nuclear cataract, left eye: Secondary | ICD-10-CM | POA: Diagnosis not present

## 2021-05-28 DIAGNOSIS — H25013 Cortical age-related cataract, bilateral: Secondary | ICD-10-CM | POA: Diagnosis not present

## 2021-05-28 NOTE — Chronic Care Management (AMB) (Signed)
  Chronic Care Management   Outreach Note  05/28/2021 Name: Katherine [name] MRN: [id number] DOB: 1951/04/19  Referred by: [name], NP Reason for referral : No chief complaint on file.   An unsuccessful telephone outreach was attempted today. The patient was referred to the pharmacist for assistance with care management and care coordination.   Follow Up Plan:   Tatjana Dellinger Upstream Scheduler

## 2021-05-28 NOTE — Telephone Encounter (Signed)
Pt was seen at urgent care and was prescribed medication and she wants to see if she can get refill until her next appt with provider-Cefalexin

## 2021-05-28 NOTE — Chronic Care Management (AMB) (Signed)
  Chronic Care Management   Note  05/28/2021 Name: Katherine [name] MRN: [id number] DOB: 12-31-1950  Katherine [name] is a 70 y.o. year old female who is a primary care patient of [name], NP. I reached out to [name] by phone today in response to a referral sent by [name]'s PCP, [name], NP.   [name] including:  CCM service includes personalized support from designated clinical staff supervised by her physician, including individualized plan of care and coordination with other care providers 24/7 contact phone numbers for assistance for urgent and routine care needs. Service will only be billed when office clinical staff spend 20 minutes or more in a month to coordinate care. Only one practitioner may furnish and bill the service in a calendar month. The patient may stop CCM services at any time (effective at the end of the month) by phone call to the office staff.   Patient agreed to services and verbal consent obtained.   Follow up plan:   Tatjana Secretary/administrator

## 2021-05-28 NOTE — Telephone Encounter (Signed)
Left message to return call to our office.  

## 2021-05-29 NOTE — Telephone Encounter (Signed)
I have called patient but was not able to reach you will not refill right? Want to make sure I have everything together when I do get her. It is hard to reach patient.

## 2021-06-02 NOTE — Telephone Encounter (Signed)
Spoke to patient documented in another open chart that we can not continue medications without testing. She did not keep her follow up for testing. Please advise patient is hard to reach how would you like me to move forward.

## 2021-06-02 NOTE — Telephone Encounter (Signed)
This patient has been diagnosed with Lewy body dementia and Parkinson's disease.  We can reach out to her husband Katherine [name] who is on her DPR.  He is also our patient.

## 2021-06-02 NOTE — Telephone Encounter (Signed)
Have called patient left message to call few times about this. Ok to send letter with information?

## 2021-06-04 ENCOUNTER — Ambulatory Visit (INDEPENDENT_AMBULATORY_CARE_PROVIDER_SITE_OTHER): Payer: Medicare Other | Admitting: Primary Care

## 2021-06-04 ENCOUNTER — Telehealth: Payer: Self-pay

## 2021-06-04 ENCOUNTER — Other Ambulatory Visit: Payer: Self-pay

## 2021-06-04 ENCOUNTER — Encounter: Payer: Self-pay | Admitting: Primary Care

## 2021-06-04 VITALS — BP 126/78 | HR 102 | Temp 97.7°F | Ht 62.0 in | Wt 179.0 lb

## 2021-06-04 DIAGNOSIS — D509 Iron deficiency anemia, unspecified: Secondary | ICD-10-CM | POA: Diagnosis not present

## 2021-06-04 DIAGNOSIS — R5383 Other fatigue: Secondary | ICD-10-CM | POA: Diagnosis not present

## 2021-06-04 DIAGNOSIS — R300 Dysuria: Secondary | ICD-10-CM | POA: Diagnosis not present

## 2021-06-04 LAB — POC URINALSYSI DIPSTICK (AUTOMATED)
Bilirubin, UA: NEGATIVE
Glucose, UA: NEGATIVE
Nitrite, UA: NEGATIVE
Protein, UA: POSITIVE — AB
Spec Grav, UA: 1.015 (ref 1.010–1.025)
Urobilinogen, UA: 0.2 E.U./dL
pH, UA: 6 (ref 5.0–8.0)

## 2021-06-04 NOTE — Patient Instructions (Signed)
Stop by the lab prior to leaving today. I will notify you of your results once received.   We will be in touch once I review your lab results.   Drink plenty of water to keep your urine light yellow.   It was a pleasure to see you today!

## 2021-06-04 NOTE — Telephone Encounter (Signed)
Hold patient has appointment today.

## 2021-06-04 NOTE — Telephone Encounter (Signed)
Reviewed with patient in office no further action needed at this time.

## 2021-06-04 NOTE — Assessment & Plan Note (Signed)
Unclear as she does have a recurrent history of cystitis, however, last UA appeared infectious but had negative culture.   She receives antibiotics frequently from urgent care centers so we need to be careful with overuse of antibiotics.   UA today with 3+ leuks, negative nitrites, trace blood. Culture sent.   Checking labs including CBC with diff.  Await labs prior to treatment. If CBC is positive with left shift then will proceed with treatment, otherwise await culture.  She is comfortable today and is not sickly appearing.

## 2021-06-04 NOTE — Telephone Encounter (Signed)
Reviewed with patient aware that she does not need Hep B

## 2021-06-04 NOTE — Chronic Care Management (AMB) (Signed)
Chronic Care Management Pharmacy Assistant   Name: Katherine [name]  MRN: [id number] DOB: 1950-10-07  Katherine [name] is an 70 y.o. year old female who presents for his initial CCM visit with the clinical pharmacist.  Reason for Encounter: Initial Questions   Conditions to be addressed/monitored: HTN and HLD   Recent office visits:  06/04/21-PCP-Katherine Clark,NP-Patient presented for dysuria and fatigue,labs ordered(Kidneys, electrolytes look good.Iron levels are low again.   We could do another iron infusion if she'd like. Your urine test did come back positive for a UTI so [facility]. ... 03/10/21-PCP-Katherine Clark,NP-Patient presented for follow up recurrent UTI's,and Parkinson's disease.Labs ordered(UA negative) Vitamin D level low, blood sugars prediabetic range,referral for home health PT,  Recent consult visits:  [facility]Kimberly Alves,NP-Patient presented for Covid-19 test 01/22/21-[facility]-Ian Lee,MD- Patient presented for follow up parkinson's disease-continue Nuplazid 34mg  1 capsule daily  12/24/20- [name]- therapy session- no data found 12/11/20-[facility]rology- Festus Aloe- no data found   Hospital visits:  05/11/21-[facility]-Philip Lamptey,MD-Patient presented for dysuria.UA,xrays, Acute Cystitis-start Keflex 500mg  twice daily for 5 days,increase fluid intake.No admission  02/27/21-[facility]-Kristin Boddu FNP-Patient presented for dysuria.UA ordered,start Cephalexin 500mg  1 capsule twice daily for 7 days.No admission  Medications: Outpatient Encounter Medications as of 06/04/2021  Medication Sig   acetaminophen (TYLENOL) 325 MG tablet Take 650 mg by mouth every 6 (six) hours as needed.   buPROPion (WELLBUTRIN XL) 150 MG 24 hr tablet Take 1 tablet by mouth every morning.   diazepam (VALIUM) 10 MG tablet Take 10 mg by mouth at bedtime.   DULoxetine (CYMBALTA) 30  MG capsule Take 30 mg by mouth daily. (Total dose = 150 mg)   DULoxetine (CYMBALTA) 60 MG capsule Take 120 mg by mouth every morning. (Total dose = 150 mg)   famotidine (PEPCID) 20 MG tablet TAKE 1 TABLET (20 MG TOTAL) BY MOUTH DAILY. FOR HEARTBURN.   gabapentin (NEURONTIN) 300 MG capsule TAKE 1 CAPSULE (300 MG TOTAL) BY MOUTH AT BEDTIME. FOR BACK PAIN.   Multiple Vitamin (MULTIVITAMIN) tablet Take 1 tablet by mouth daily.   NUPLAZID 34 MG CAPS Take 1 capsule by mouth daily.   OVER THE COUNTER MEDICATION [employer] vitamin   OVER THE COUNTER MEDICATION 2 tablets daily. [employer]   pantoprazole (PROTONIX) 40 MG tablet Take 1 tablet (40 mg total) by mouth daily. For heartburn.   spironolactone (ALDACTONE) 25 MG tablet Take 1 tablet (25 mg total) by mouth daily.   SUMAtriptan (IMITREX) 100 MG tablet Take 100 mg by mouth as needed.   tamsulosin (FLOMAX) 0.4 MG CAPS capsule Take 0.4 mg by mouth daily.   topiramate (TOPAMAX) 50 MG tablet Take 50 mg by mouth daily.    No facility-administered encounter medications on file as of 06/04/2021.    Lab Results  Component Value Date/Time   HGBA1C 6.2 03/10/2021 08:45 AM   HGBA1C 6.3 04/15/2020 12:41 PM     BP Readings from Last 3 Encounters:  05/11/21 130/79  03/10/21 (!) 146/88  02/27/21 (!) 158/90    Patient contacted to review initial questions prior to visit with Charlene Brooke.  Have you seen any other providers since your last visit with PCP? No    Unsuccessful attempt to reach patient. Left patient message to have all medications, supplements and any blood glucose and blood pressure readings available for review at appointment.   Patient did leave me a voice mail  over the weekend that she did not understand my call, I attempted to reach out again,left detailed message  Star Rating Drugs:  Medication:  Last Fill: Day Supply No star meds identified  Spironolactone 25mg  05/22/21 90   Care Gaps: Annual wellness visit in last  year? Yes Most Recent BP reading:146/88 98-P  03/10/21   Charlene Brooke, [facility], CPP notified  Avel Sensor, [place] Assistant [phone]  Total time spent for month CPA:0 min.

## 2021-06-04 NOTE — Assessment & Plan Note (Signed)
Checking CBC, iron panel, BMP, TSH.  Urine culture pending.

## 2021-06-04 NOTE — Progress Notes (Signed)
Subjective:    Patient ID: Katherine [name], female    DOB: [birth year], 70 y.o.   MRN: [id number]  HPI  Katherine [name] is a very pleasant 69 y.o. female with a history of recurrent cystitis, Lewy Body Dementia, Parkinson's Disease, prediabetes who presents today to discuss fatigue   Evaluated at Urgent Care in [place] on 05/11/21 for 1 day history of dysuria only. Treated with cephalexin 500 mg BID x 5 days for suspected cystitis. Urine culture returned with "10,000 colonies/ml multiple species present, suggest recollection".  Also treated for UTI on 02/27/21 in the ED with Keflex. Urine culture returned positive for E. Coli.   Today she endorses painful urination and fatigue that began She is following with [facility] underwent evaluation and was told that the bladder neck was "shrinking" due to her Parkinsonism's. She has an appointment with [facility] later this month.   She feels tired "after doing anything". One task will "wear me out". She has increased her cooking as she's taking meals over to her son who recently sustained a traumatic injury. She denies dizziness, recent falls.   She has an appointment with the [facility] in late January 2023 for thorough evaluation of Lewy Body Dementia and Parkinson's Disease which were diagnosed nearly two years ago. She's very excited about this visit as she believes she has no symptoms of Parkinson's disease. She denies hallucinating. She's never undergone imaging of her brain. She's ready for a thorough work up.   BP Readings from Last 3 Encounters:  06/04/21 126/78  05/11/21 130/79  03/10/21 (!) 146/88      Review of Systems  Constitutional:  Negative for fever.  Respiratory:  Negative for shortness of breath.   Cardiovascular:  Negative for chest pain.  Genitourinary:  Positive for dysuria. Negative for flank pain, frequency and hematuria.  Neurological:  Negative for dizziness.        Past Medical History:   Diagnosis Date   Anemia 2018   Anxiety    Arthritis    Borderline diabetes    Chest pain    a. Normal coronaries by cath 2007; Anomalous RCA arising from LAD diagonal (versus total native RCA with collateral)   Chicken pox    age 29   Chronic back pain    Compressed discs. [facility]   Depression    Dyspnea on exertion    a. 03/2009 Echo: EF 65%, no rwma, triv TR.   GERD (gastroesophageal reflux disease)    High cholesterol    Hypertension    Hypokalemia    Migraine headache    Obesity    Parkinson's disease ([place])    Pre-diabetes    okay now     Social History   Socioeconomic History   Marital status: Married    Spouse name: Not on file   Number of children: Not on file   Years of education: Not on file   Highest education level: Not on file  Occupational History   Occupation: Product manager: [employer]  Tobacco Use   Smoking status: Never   Smokeless tobacco: Never  Vaping Use   Vaping Use: Never used  Substance and Sexual Activity   Alcohol use: No   Drug use: No   Sexual activity: Yes    Partners: Male    Birth control/protection: Post-menopausal  Other Topics Concern   Not on file  Social History Narrative   Married.   One son  is 58, lives in [place].   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   Enjoys substitute teaching, gardening, cooking.   Social Determinants of Health   Financial Resource Strain: Not on file  Food Insecurity: Not on file  Transportation Needs: Not on file  Physical Activity: Not on file  Stress: Not on file  Social Connections: Not on file  Intimate Partner Violence: Not on file    Past Surgical History:  Procedure Laterality Date   BLADDER SUSPENSION  2005   [place] N/A 01/11/2020   Procedure: CLOSED REDUCTION NASAL FRACTURE;  Surgeon: Milany Gust, MD;  Location: [place];  Service: ENT;  Laterality: N/A;    ENDOMETRIAL ABLATION  2007   FOOT SURGERY     bilateral bunions   LUMBAR LAMINECTOMY/DECOMPRESSION MICRODISCECTOMY Bilateral 01/12/2016   Procedure: Bilateral L4-5 Laminotomy/Foraminotomy;  Surgeon: Newman Pies, MD;  Location: [facility];  Service: Neurosurgery;  Laterality: Bilateral;  Bilateral L4-5 Laminotomy/Foraminotomy   TONSILLECTOMY  1971    Family History  Problem Relation Age of Onset   Heart disease Mother    Hypertension Mother    Heart failure Mother        CHF   Diabetes Mother    Cancer Mother        CERVICAL CANCER   Heart disease Father    Diabetes Father    Liver disease Father    Hypertension Father    Diabetes Sister    Breast cancer Sister 24   Heart disease Brother    Heart failure Brother        CHF   Diabetes Brother    Cancer Brother 78       THROAT AND NECK   Esophageal cancer Brother    Cancer Maternal Grandfather    Colon cancer Neg Hx    Stomach cancer Neg Hx    Rectal cancer Neg Hx     Allergies  Allergen Reactions   Dilaudid [Hydromorphone]     hallucinations   Codeine Anxiety   Darvon Anxiety   Hydrocodone Anxiety   Meperidine Anxiety   Oxycodone Anxiety   Tramadol Anxiety   Victoza [Liraglutide] Nausea Only and Other (See Comments)    And weakness    Current Outpatient Medications on File Prior to Visit  Medication Sig Dispense Refill   acetaminophen (TYLENOL) 325 MG tablet Take 650 mg by mouth every 6 (six) hours as needed.     buPROPion (WELLBUTRIN XL) 150 MG 24 hr tablet Take 1 tablet by mouth every morning.     diazepam (VALIUM) 10 MG tablet Take 10 mg by mouth at bedtime.  2   DULoxetine (CYMBALTA) 30 MG capsule Take 30 mg by mouth daily. (Total dose = 150 mg)     DULoxetine (CYMBALTA) 60 MG capsule Take 120 mg by mouth every morning. (Total dose = 150 mg)     famotidine (PEPCID) 20 MG tablet TAKE 1 TABLET (20 MG TOTAL) BY MOUTH DAILY. FOR HEARTBURN. 90 tablet 1   gabapentin (NEURONTIN) 300 MG capsule TAKE 1  CAPSULE (300 MG TOTAL) BY MOUTH AT BEDTIME. FOR BACK PAIN. 90 capsule 2   Multiple Vitamin (MULTIVITAMIN) tablet Take 1 tablet by mouth daily.     NUPLAZID 34 MG CAPS Take 1 capsule by mouth daily.     OVER THE COUNTER MEDICATION [employer] vitamin     OVER THE COUNTER MEDICATION 2 tablets daily. [employer]  pantoprazole (PROTONIX) 40 MG tablet Take 1 tablet (40 mg total) by mouth daily. For heartburn. 90 tablet 3   spironolactone (ALDACTONE) 25 MG tablet Take 1 tablet (25 mg total) by mouth daily. 90 tablet 3   SUMAtriptan (IMITREX) 100 MG tablet Take 100 mg by mouth as needed.     tamsulosin (FLOMAX) 0.4 MG CAPS capsule Take 0.4 mg by mouth daily.     topiramate (TOPAMAX) 50 MG tablet Take 50 mg by mouth daily.      No current facility-administered medications on file prior to visit.    BP 126/78   Pulse (!) 102   Temp 97.7 F (36.5 C) (Temporal)   Ht 5\' 2"  (1.575 m)   Wt 179 lb (81.2 kg)   SpO2 97%   BMI 32.74 kg/m  Objective:   Physical Exam Cardiovascular:     Rate and Rhythm: Normal rate and regular rhythm.  Pulmonary:     Effort: Pulmonary effort is normal.     Breath sounds: Normal breath sounds.  Abdominal:     Tenderness: There is no right CVA tenderness or left CVA tenderness.  Musculoskeletal:     Cervical back: Neck supple.  Skin:    General: Skin is warm and dry.  Neurological:     Mental Status: She is alert and oriented to person, place, and time.  Psychiatric:        Mood and Affect: Mood normal.          Assessment & Plan:      This visit occurred during the SARS-CoV-2 public health emergency.  Safety protocols were in place, including screening questions prior to the visit, additional usage of staff PPE, and extensive cleaning of exam room while observing appropriate contact time as indicated for disinfecting solutions.

## 2021-06-05 LAB — CBC WITH DIFFERENTIAL/PLATELET
Basophils Absolute: 0.1 10*3/uL (ref 0.0–0.1)
Basophils Relative: 0.8 % (ref 0.0–3.0)
Eosinophils Absolute: 0.4 10*3/uL (ref 0.0–0.7)
Eosinophils Relative: 4.1 % (ref 0.0–5.0)
HCT: 38.2 % (ref 36.0–46.0)
Hemoglobin: 11.8 g/dL — ABNORMAL LOW (ref 12.0–15.0)
Lymphocytes Relative: 28.2 % (ref 12.0–46.0)
Lymphs Abs: 2.9 10*3/uL (ref 0.7–4.0)
MCHC: 31 g/dL (ref 30.0–36.0)
MCV: 85.2 fl (ref 78.0–100.0)
Monocytes Absolute: 0.9 10*3/uL (ref 0.1–1.0)
Monocytes Relative: 8.7 % (ref 3.0–12.0)
Neutro Abs: 5.9 10*3/uL (ref 1.4–7.7)
Neutrophils Relative %: 58.2 % (ref 43.0–77.0)
Platelets: 350 10*3/uL (ref 150.0–400.0)
RBC: 4.48 Mil/uL (ref 3.87–5.11)
RDW: 15.7 % — ABNORMAL HIGH (ref 11.5–15.5)
WBC: 10.1 10*3/uL (ref 4.0–10.5)

## 2021-06-05 LAB — BASIC METABOLIC PANEL
BUN: 15 mg/dL (ref 6–23)
CO2: 24 mEq/L (ref 19–32)
Calcium: 9.1 mg/dL (ref 8.4–10.5)
Chloride: 106 mEq/L (ref 96–112)
Creatinine, Ser: 0.92 mg/dL (ref 0.40–1.20)
GFR: 63.47 mL/min (ref >60.00)
Glucose, Bld: 74 mg/dL (ref 70–99)
Potassium: 4 mEq/L (ref 3.5–5.1)
Sodium: 139 mEq/L (ref 135–145)

## 2021-06-05 LAB — IBC + FERRITIN
Ferritin: 4.3 ng/mL — ABNORMAL LOW (ref 10.0–291.0)
Iron: 26 ug/dL — ABNORMAL LOW (ref 42–145)
Saturation Ratios: 5.5 % — ABNORMAL LOW (ref 20.0–50.0)
TIBC: 474.6 ug/dL — ABNORMAL HIGH (ref 250.0–450.0)
Transferrin: 339 mg/dL (ref 212.0–360.0)

## 2021-06-06 LAB — URINE CULTURE
MICRO NUMBER:: 12651767
SPECIMEN QUALITY:: ADEQUATE

## 2021-06-07 ENCOUNTER — Other Ambulatory Visit: Payer: Self-pay | Admitting: Primary Care

## 2021-06-07 DIAGNOSIS — N3001 Acute cystitis with hematuria: Secondary | ICD-10-CM

## 2021-06-07 MED ORDER — SULFAMETHOXAZOLE-TRIMETHOPRIM 800-160 MG PO TABS
1.0000 | ORAL_TABLET | Freq: Two times a day (BID) | ORAL | 0 refills | Status: DC
Start: 2021-06-07 — End: 2021-08-27

## 2021-06-08 NOTE — Telephone Encounter (Signed)
Pt returned call . Would  like a call back to discuss lab results  [phone]

## 2021-06-09 ENCOUNTER — Ambulatory Visit (INDEPENDENT_AMBULATORY_CARE_PROVIDER_SITE_OTHER): Payer: Medicare Other | Admitting: Pharmacist

## 2021-06-09 ENCOUNTER — Telehealth: Payer: Self-pay

## 2021-06-09 ENCOUNTER — Other Ambulatory Visit: Payer: Self-pay

## 2021-06-09 DIAGNOSIS — G20 Parkinson's disease: Secondary | ICD-10-CM

## 2021-06-09 DIAGNOSIS — D509 Iron deficiency anemia, unspecified: Secondary | ICD-10-CM

## 2021-06-09 DIAGNOSIS — R32 Unspecified urinary incontinence: Secondary | ICD-10-CM

## 2021-06-09 DIAGNOSIS — K219 Gastro-esophageal reflux disease without esophagitis: Secondary | ICD-10-CM

## 2021-06-09 DIAGNOSIS — E782 Mixed hyperlipidemia: Secondary | ICD-10-CM

## 2021-06-09 DIAGNOSIS — F02818 Dementia in other diseases classified elsewhere, unspecified severity, with other behavioral disturbance: Secondary | ICD-10-CM

## 2021-06-09 DIAGNOSIS — I10 Essential (primary) hypertension: Secondary | ICD-10-CM

## 2021-06-09 DIAGNOSIS — G3183 Dementia with Lewy bodies: Secondary | ICD-10-CM

## 2021-06-09 NOTE — Progress Notes (Signed)
[facility] with [facility] and the patient got the flu shot 04/21/21, Covid booster-Mederna 04/30/21, Pneumovax 23 04/30/21.   Charlene Brooke RPH,CPP notified  Avel Sensor, [place] Assistant [phone]  Total time spent for month CPA: 10 min.

## 2021-06-09 NOTE — Patient Instructions (Signed)
Visit Information  Phone number for Pharmacist: [phone]  Thank you for meeting with me to discuss your medications! I look forward to working with you to achieve your health care goals. Below is a summary of what we talked about during the visit:   Goals Addressed             This Visit's Progress    Manage My Medicine       Timeframe:  Long-Range Goal Priority:  High Start Date:        06/09/21                     Expected End Date:       06/09/22                Follow Up Date May 2023   - call for medicine refill 2 or 3 days before it runs out - call if I am sick and can't take my medicine - keep a list of all the medicines I take; vitamins and herbals too - use a pillbox to sort medicine    Why is this important?   These steps will help you keep on track with your medicines.   Notes:         Care Plan : [place]  Updates made by Charlton Haws, [place] since 06/09/2021 12:00 AM     Problem: Hypertension, Hyperlipidemia, GERD, Depression, and Osteopenia, Dementia with behavioral disturbance   Priority: High     Long-Range Goal: Disease management   Start Date: 06/09/2021  Expected End Date: 06/09/2022  This Visit's Progress: On track  Priority: High  Note:   Current Barriers:  Unable to independently monitor therapeutic efficacy  Pharmacist Clinical Goal(s):  Patient will achieve adherence to monitoring guidelines and medication adherence to achieve therapeutic efficacy through collaboration with PharmD and provider.   Interventions: 1:1 collaboration with [name], NP regarding development and update of comprehensive plan of care as evidenced by provider attestation and co-signature Inter-disciplinary care team collaboration (see longitudinal plan of care) Comprehensive medication review performed; medication list updated in electronic medical record  Hypertension (BP goal <130/80) -Controlled - pt reports home BP is at  goal; she checks regularly but has not been keeping a log -Current home readings: 120s/70s -Current treatment: Spironolactone 25 mg daily -Medications previously tried: carvedilol, chlorthalidone  -Denies hypotensive/hypertensive symptoms -Educated on BP goals and benefits of medications for prevention of heart attack, stroke and kidney damage; Importance of home blood pressure monitoring; -Counseled to monitor BP at home daily, document, and provide log at future appointments -Recommended to continue current medication  Hyperlipidemia: (LDL goal < 100) -Not ideally controlled - pt was previously off of simvastatin for several months, LDL had increased from 60 to 143; she reports she is now taking simvastatin again, she had plenty on hand so has not needed a refill yet -Current treatment: Simvastatin 20 mg daily -Medications previously tried: simvastatin  -Educated on Cholesterol goals; Benefits of statin for ASCVD risk reduction; -Added simvastatin back to med list -Recommended to continue current medication  Depression/Anxiety (Goal: manage symptoms) -Relatively Controlled -pt reports duloxetine is the only drug that has ever helped her depression; pt reports family hx of depression (mother and father, brother); she reports 50 year hx of depression; she complains of dry mouth - Biotene, lozenges help some -trouble falling asleep, but sleeps well once she is asleep -Current treatment: Duloxetine 30 mg daily Duloxetine 60 mg -  2 tab daily Bupropion XL 150 mg daily AM Diazepam 10 mg HS -Medications previously tried/failed: aripiprazole, quetiapine, doxepin  -PHQ9: 10 (02/2021) -GAD7: not on file -Connected with [name], NP for mental health support -Educated on Benefits of medication for symptom control;  -Counseled dry mouth is likely due to high dose duloxetine; discussed benefits outweigh risks/side effects at this time -Recommended to continue current medication  [place]  dementia with behavioral disturbances (Goal: manage symptoms) -Relatively Controlled - pt reports Nuplazid has "done miracles" for her, but does think she is starting to build a tolerance -Per neurology - Nuplazid does not appear to be controlling delusions any longer, pt does not appear to have good insight; may consider atypical antipsychotics (haloperidol, olanzapine) although these would worsen parkinsonism -Current treatment  Nuplazid 34 mg daily HS (via PAP) -Medications previously tried: quetiapine, rivastigmine, levodopa  -Recommended to continue current medication   Osteopenia (Goal prevent fractures) -Not ideally controlled - pt is not taking calcium or vitamin D; she has DEXA scan scheduled next month -Last DEXA Scan: 06/01/18   T-Score femoral neck: -1.5  T-Score total hip: -1.4  T-Score lumbar spine: -0.2  10-year probability of major osteoporotic fracture: 28.1%  10-year probability of hip fracture: 3.2% -Patient is a candidate for pharmacologic treatment due to T-Score -1.0 to -2.5 and 10-year risk of major osteoporotic fracture > 20% and T-Score -1.0 to -2.5 and 10-year risk of hip fracture > 3% -Current treatment  None -Medications previously tried: alendronate (GI)  -Recommend [phone] units of vitamin D daily. Recommend 1200 mg of calcium daily from dietary and supplemental sources.  -Consider pharmacologic treatment based on DEXA results next month  Migraines (Goal: reduce migraine frequency) -Controlled - pt reports rare migraines since starting topiramate, she used to have them once a week -Current treatment  Topiramate 50 mg daily Sumatriptan 100 mg PRN - rare use -Recommended to continue current medication  Back pain (Goal: manage symptoms) -Controlled - pt reports overall back pain is improving; she using gabapentin PRN and Aleve daily; she is worried about Tylenol and liver issues because her father died from non-alcoholic liver disease -Hx 2 back surgeries  2018 -Current treatment  Gabapentin 300 mg HS - PRN Tylenol 325 mg PRN Aleve OTC Duloxetine 150 mg/day -Counseled on risks of chronic NSAIDs (GI bleeding, hypertension, kidney damage);  -Educated on NALD; Tylenol liver risks; max daily dose of 3000 mg/day;  -Advised to use Tylenol daily and Aleve infrequently  Urinary voiding difficulty (Goal: manage symptoms) -Not ideally controlled - pt reports tamsulosin has helped with voiding difficulties but it is still a major issue; she is starting pelvic floor PT in December and hopeful that this will help -Current treatment  Tamsulosin 0.4 mg daily -Recommended to continue current medication  Iron deficiency anemia (Goal: manage symptoms) -Uncontrolled - pt reports she is not taking iron supplements, she had significant constipation with them int he past and she would like another iron infusion -Current treatment  None -Will coordinate with PCP to set up iron infusion  GERD (Goal: manage symptoms) -Controlled - pt reports dairy products trigger GERD; she takes PPI daily and has occasional breakthrough issue for which she takes famotidine -Current treatment  Pantoprazole 40 mg daily Famotidine 20 mg PRN -Counseled on long term PPI risks; pt is not interested in stopping PPI -Recommended to continue current medication  Health Maintenance -Vaccine gaps: Prevnar, Covid booster, Flu -Pt reports she had PNA, flu and covid booster at [facility] within the  past month or so. Will get  -Current therapy:  Multivitamin [employer] vitamin (hair/skin/nails) Fiber supplement -Patient is satisfied with current therapy and denies issues -Recommended to continue current medication  Patient Goals/Self-Care Activities Patient will:  - take medications as prescribed as evidenced by patient report and record review focus on medication adherence by pill box check blood pressure daily, document, and provide at future appointments -Stop daily Aleve, take  Tylenol instead (up to 3000 mg/day)      Ms. Curnutt was given information about Chronic Care Management services today including:  CCM service includes personalized support from designated clinical staff supervised by her physician, including individualized plan of care and coordination with other care providers 24/7 contact phone numbers for assistance for urgent and routine care needs. Standard insurance, coinsurance, copays and deductibles apply for chronic care management only during months in which we provide at least 20 minutes of these services. Most insurances cover these services at 100%, however patients may be responsible for any copay, coinsurance and/or deductible if applicable. This service may help you avoid the need for more expensive face-to-face services. Only one practitioner may furnish and bill the service in a calendar month. The patient may stop CCM services at any time (effective at the end of the month) by phone call to the office staff.  Patient agreed to services and verbal consent obtained.   Patient verbalizes understanding of instructions provided today and agrees to view in [place].  Telephone follow up appointment with pharmacy team member scheduled for: 2 months  Charlene Brooke, PharmD, [facility] Clinical Pharmacist [facility] [phone]

## 2021-06-09 NOTE — Progress Notes (Signed)
Chronic Care Management Pharmacy Note  06/09/2021 Name:  Katherine [name] MRN:  [id number] DOB:  1951-05-12  Summary: -Pt would like to pursue iron infusion again -Pt started taking simvastatin 20 mg again (was previously off for several months). Added to med list. -2019 DEXA scan/FRAX score indicate patient was a candidate for pharmacologic treatment of osteopenia. She has repeat DEXA 06/2021 -Pt is taking Aleve daily for back pain  Recommendations/Changes made from today's visit: -Coordinate with PCP for iron infusion -Consider Prolia pending DEXA results 06/2021 -Advised to use Tylenol instead of Aleve on chronic basis  Plan: -[facility] will call patient in 1 month to ensure mammogram and DEXA scan occur -Pharmacist follow up televisit scheduled for in 2 months   Subjective: Katherine [name] is an 70 y.o. year old female who is a primary patient of Katherine Koch, NP.  The CCM team was consulted for assistance with disease management and care coordination needs.    Engaged with patient by telephone for initial visit in response to provider referral for pharmacy case management and/or care coordination services.   Consent to Services:  The patient was given the following information about Chronic Care Management services today, agreed to services, and gave verbal consent: 1. CCM service includes personalized support from designated clinical staff supervised by the primary care provider, including individualized plan of care and coordination with other care providers 2. 24/7 contact phone numbers for assistance for urgent and routine care needs. 3. Service will only be billed when office clinical staff spend 20 minutes or more in a month to coordinate care. 4. Only one practitioner may furnish and bill the service in a calendar month. 5.The patient may stop CCM services at any time (effective at the end of the month) by phone call to the office staff. 6. The patient  will be responsible for cost sharing (co-pay) of up to 20% of the service fee (after annual deductible is met). Patient agreed to services and consent obtained.  Patient Care Team: Katherine Koch, NP as PCP - General (Nurse Practitioner) Katherine Merritts, MD as PCP - Cardiology (Cardiology) Katherine Merritts, MD as Consulting Physician (Cardiology) Katherine Aloe, MD as Consulting Physician (Urology) Katherine Chapel, NP as Nurse Practitioner Katherine [name], [facility] as Pharmacist (Pharmacist)  Patient lives at home with her husband. 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   Recent office visits: 06/04/21-PCP-Katherine Clark,NP-Patient presented for dysuria and fatigue,labs ordered(Kidneys, electrolytes look good.Iron levels are low again.   We could do another iron infusion if she'd like. Rx'd bactrim for UTI.  03/10/21-PCP-Katherine Clark,NP-Patient presented for follow up recurrent UTI's,and Parkinson's disease.Labs ordered(UA negative) Vitamin D level low, blood sugars prediabetic range,referral for home health PT,  Recent consult visits: 05/11/21-[facility]-Katherine Lamptey,MD-Patient presented for dysuria.UA,xrays, Acute Cystitis-start Keflex 550m twice daily for 5 days,increase fluid intake. 02/27/21-[facility]-Katherine Boddu FNP-Patient presented for dysuria.UA ordered,start Cephalexin 509m1 capsule twice daily for 7 days 02/22/21-[facility]-Katherine Alves,NP-Patient presented for Covid-19 test  01/22/21-[facility]-Katherine Lee,MD- f/u Lewy body dementia w/ delusions. continue Nuplazid 3495m capsule daily. D/W PCP, psychiatrist if antipsychotics (haloperidol, olanzapine) would be an option for controlling severe delusions, would likely make Parkinsonism worse.  12/24/20- LisNoemi Chapelherapy session- no data found 12/11/20-Urology- Katherine [name] data found  Hospital visits: None in previous 6 months   Objective:  Lab Results   Component Value Date   CREATININE 0.92 06/04/2021   BUN 15  06/04/2021   GFR 63.47 06/04/2021   GFRNONAA >60 11/11/2020   GFRAA 90 03/03/2018   NA 139 06/04/2021   K 4.0 06/04/2021   CALCIUM 9.1 06/04/2021   CO2 24 06/04/2021   GLUCOSE 74 06/04/2021    Lab Results  Component Value Date/Time   HGBA1C 6.2 03/10/2021 08:45 AM   HGBA1C 6.3 04/15/2020 12:41 PM   GFR 63.47 06/04/2021 03:48 PM   GFR 68.26 08/14/2020 12:43 PM    Last diabetic Eye exam: No results found for: HMDIABEYEEXA  Last diabetic Foot exam: No results found for: HMDIABFOOTEX   Lab Results  Component Value Date   CHOL 211 (H) 03/10/2021   HDL 57.40 03/10/2021   LDLCALC 60 04/15/2020   LDLDIRECT 143.0 03/10/2021   TRIG 266.0 (H) 03/10/2021   CHOLHDL 4 03/10/2021    Hepatic Function Latest Ref Rng & Units 11/11/2020 04/15/2020 07/09/2019  Total Protein 6.5 - 8.1 g/dL 7.1 6.6 7.0  Albumin 3.5 - 5.0 g/dL 4.2 3.9 4.1  AST 15 - 41 U/L _0 ALT 0 - 44 U/L _1 Alk Phosphatase 38 - 126 U/L 110 98 115  Total Bilirubin 0.3 - 1.2 mg/dL 0.6 0.5 0.3  Bilirubin, Direct 0.0 - 0.3 mg/dL - - -    Lab Results  Component Value Date/Time   TSH 3.33 10/07/2020 08:50 AM   TSH 1.93 03/13/2018 04:15 PM    CBC Latest Ref Rng & Units 06/04/2021 03/10/2021 11/11/2020  WBC 4.0 - 10.5 K/uL 10.1 9.6 11.5(H)  Hemoglobin 12.0 - 15.0 g/dL 11.8(L) 12.1 14.0  Hematocrit 36.0 - 46.0 % 38.2 38.4 44.8  Platelets 150.0 - 400.0 K/uL 350.0 324.0 326    Lab Results  Component Value Date/Time   VD25OH 21.57 (L) 03/10/2021 08:45 AM   VD25OH 38.63 04/15/2020 12:41 PM    Clinical ASCVD: No  The 10-year ASCVD risk score (Arnett DK, et al., 2019) is: 11.1%   Values used to calculate the score:     Age: 70 years     Sex: Female     Is Non-Hispanic African American: No     Diabetic: No     Tobacco smoker: No     Systolic Blood Pressure: 812 mmHg     Is BP treated: Yes     HDL Cholesterol: 57.4 mg/dL     Total Cholesterol: 211  mg/dL    Depression screen [facility] 2/9 02/24/2021 04/15/2020 07/09/2019  Decreased Interest 0 0 3  Down, Depressed, Hopeless _2 PHQ - 2 Score _3 Altered sleeping 0 0 3  Tired, decreased energy 3 3 0  Change in appetite 3 3 0  Feeling bad or failure about yourself  3 3 0  Trouble concentrating 0 0 0  Moving slowly or fidgety/restless 0 0 0  Suicidal thoughts - 0 0  PHQ-9 Score _4 Difficult doing work/chores - Not difficult at all Somewhat difficult  Some recent data might be hidden     Social History   Tobacco Use  Smoking Status Never  Smokeless Tobacco Never   BP Readings from Last 3 Encounters:  06/04/21 126/78  05/11/21 130/79  03/10/21 (!) 146/88   Pulse Readings from Last 3 Encounters:  06/04/21 (!) 102  05/11/21 92  03/10/21 98   Wt Readings from Last 3 Encounters:  06/04/21 179 lb (81.2 kg)  03/10/21 184 lb (83.5 kg)  11/11/20 175 lb (79.4 kg)   BMI  Readings from Last 3 Encounters:  06/04/21 32.74 kg/m  03/10/21 33.65 kg/m  11/11/20 32.01 kg/m    Assessment/Interventions: Review of patient past medical history, allergies, medications, health status, including review of consultants reports, laboratory and other test data, was performed as part of comprehensive evaluation and provision of chronic care management services.   SDOH:  (Social Determinants of Health) assessments and interventions performed: Yes  SDOH Screenings   Alcohol Screen: Not on file  Depression (PHQ2-9): Medium Risk   PHQ-2 Score: 10  Financial Resource Strain: Not on file  Food Insecurity: Not on file  Housing: Not on file  Physical Activity: Not on file  Social Connections: Not on file  Stress: Not on file  Tobacco Use: Low Risk    Smoking Tobacco Use: Never   Smokeless Tobacco Use: Never   Passive Exposure: Not on file  Transportation Needs: Not on file    [place]  Allergies  Allergen Reactions   Dilaudid [Hydromorphone]     hallucinations   Codeine  Anxiety   [place] Anxiety   Hydrocodone Anxiety   Meperidine Anxiety   Oxycodone Anxiety   Tramadol Anxiety   Victoza [Liraglutide] Nausea Only and Other (See Comments)    And weakness    Medications Reviewed Today     Reviewed by Katherine [name], [facility] (Pharmacist) on 06/09/21 at 1513  Med List Status: <None>   Medication Order Taking? Sig Documenting Provider Last Dose Status Informant  acetaminophen (TYLENOL) 325 MG tablet [id number] Yes Take 650 mg by mouth every 6 (six) hours as needed. [provider] Taking Active   buPROPion (WELLBUTRIN XL) 150 MG 24 hr tablet [id number] Yes Take 1 tablet by mouth every morning. [provider] Taking Active   diazepam (VALIUM) 10 MG tablet [id number] Yes Take 10 mg by mouth at bedtime. [provider] Taking Active   DULoxetine (CYMBALTA) 30 MG capsule [id number] Yes Take 30 mg by mouth daily. (Total dose = 150 mg) [provider] Taking Active   DULoxetine (CYMBALTA) 60 MG capsule [id number] Yes Take 120 mg by mouth every morning. (Total dose = 150 mg) [provider] Taking Active   famotidine (PEPCID) 20 MG tablet [id number] Yes TAKE 1 TABLET (20 MG TOTAL) BY MOUTH DAILY. FOR HEARTBURN. Katherine Koch, NP Taking Active   gabapentin (NEURONTIN) 300 MG capsule [id number] Yes TAKE 1 CAPSULE (300 MG TOTAL) BY MOUTH AT BEDTIME. FOR BACK PAIN.  Patient taking differently: Take 300 mg by mouth at bedtime. For back pain PRN   Katherine Koch, NP Taking Active   Multiple Vitamin (MULTIVITAMIN) tablet [id number] Yes Take 1 tablet by mouth daily. [provider] Taking Active   NUPLAZID 34 MG CAPS [id number] Yes Take 1 capsule by mouth daily. [provider] Taking Active   OVER THE COUNTER MEDICATION [id number] Yes [facility] vitamin [provider] Taking Active   OVER THE [place] [id number] Yes 2 tablets daily. [employer], Historical, MD Taking Active    pantoprazole (PROTONIX) 40 MG tablet [id number] Yes Take 1 tablet (40 mg total) by mouth daily. For heartburn. Katherine Koch, NP Taking Active   simvastatin (ZOCOR) 20 MG tablet [id number] Yes Take 20 mg by mouth daily. Katherine Merritts, MD Taking Active Self  spironolactone (ALDACTONE) 25 MG tablet [id number] Yes Take 1 tablet (25 mg total) by mouth daily. Katherine Merritts, MD Taking Active   sulfamethoxazole-trimethoprim (BACTRIM DS) 800-160 MG tablet [id number] Yes Take 1 tablet  by mouth 2 (two) times daily. For urinary tract infection. Katherine Koch, NP Taking Active   SUMAtriptan (IMITREX) 100 MG tablet [id number]  Take 100 mg by mouth as needed. [provider]  Expired 06/04/21 2359   tamsulosin (FLOMAX) 0.4 MG CAPS capsule [id number] Yes Take 0.4 mg by mouth daily. [provider] Taking Active   topiramate (TOPAMAX) 50 MG tablet [id number] Yes Take 50 mg by mouth daily.  [provider] Taking Active             Patient Active Problem List   Diagnosis Date Noted   Dysuria 03/10/2021   Vitamin B 12 deficiency 03/10/2021   Recurrent falls 03/10/2021   Hematuria 10/28/2020   Pelvic fullness in female 10/07/2020   Toe pain 08/14/2020   Lewy body dementia ([place]) 04/15/2020   Urinary incontinence 04/15/2020   Recurrent cystitis 02/13/2020   Cough 02/06/2020   Parkinson's disease ([place]) 07/11/2019   Leukocytosis 07/11/2019   Rash and nonspecific skin eruption 02/14/2019   Dizziness 12/14/2018   Osteopenia 07/07/2018   Gastroesophageal reflux disease 03/13/2018   Chronic migraine without aura without status migrainosus, not intractable 03/13/2018   Constipation 02/15/2017   Spondylolisthesis of lumbar region 07/15/2016   Vitamin D deficiency 03/11/2016   Lumbar stenosis with neurogenic claudication 01/12/2016   Notalgia 01/08/2016   Restless leg syndrome 03/13/2015   Preventative health care 12/10/2014   Other fatigue 11/04/2014   Anemia  11/04/2014   Depression 10/31/2014   Prediabetes 10/31/2014   Obstructive sleep apnea 05/01/2009   Hyperlipidemia 09/04/2008   Obesity 09/04/2008   HYPERTENSION, BENIGN 09/04/2008    Immunization History  Administered Date(s) Administered   Fluad Quad(high Dose 65+) 05/16/2019, 04/15/2020   Influenza,inj,Quad PF,6+ Mos 05/09/2015, 05/07/2016, 03/30/2017, 05/10/2018   Influenza-Unspecified 05/01/2014   Moderna Sars-Covid-2 Vaccination 08/29/2019, 09/26/2019   Pneumococcal Polysaccharide-23 12/10/2014, 07/06/2018   Td 12/10/2014   Tdap 02/08/2020   Zoster Recombinat (Shingrix) 02/27/2019, 06/12/2019   Zoster, Live 04/12/2009    Conditions to be addressed/monitored:  Hypertension, Hyperlipidemia, GERD, Depression, and Osteopenia, Dementia with behavioral disturbance  Care Plan : [place]  Updates made by Katherine [name], [place] since 06/09/2021 12:00 AM     Problem: Hypertension, Hyperlipidemia, GERD, Depression, and Osteopenia, Dementia with behavioral disturbance   Priority: High     Long-Range Goal: Disease management   Start Date: 06/09/2021  Expected End Date: 06/09/2022  This Visit's Progress: On track  Priority: High  Note:   Current Barriers:  Unable to independently monitor therapeutic efficacy  Pharmacist Clinical Goal(s):  Patient will achieve adherence to monitoring guidelines and medication adherence to achieve therapeutic efficacy through collaboration with PharmD and provider.   Interventions: 1:1 collaboration with Katherine Koch, NP regarding development and update of comprehensive plan of care as evidenced by provider attestation and co-signature Inter-disciplinary care team collaboration (see longitudinal plan of care) Comprehensive medication review performed; medication list updated in electronic medical record  Hypertension (BP goal <130/80) -Controlled - pt reports home BP is at goal; she checks regularly but has not been  keeping a log -Current home readings: 120s/70s -Current treatment: Spironolactone 25 mg daily -Medications previously tried: carvedilol, chlorthalidone  -Denies hypotensive/hypertensive symptoms -Educated on BP goals and benefits of medications for prevention of heart attack, stroke and kidney damage; Importance of home blood pressure monitoring; -Counseled to monitor BP at home daily, document, and provide log at future appointments -Recommended to continue current medication  Hyperlipidemia: (LDL goal < 100) -  Not ideally controlled - pt was previously off of simvastatin for several months, LDL had increased from 60 to 143; she reports she is now taking simvastatin again, she had plenty on hand so has not needed a refill yet -Current treatment: Simvastatin 20 mg daily -Medications previously tried: simvastatin  -Educated on Cholesterol goals; Benefits of statin for ASCVD risk reduction; -Added simvastatin back to med list -Recommended to continue current medication  Depression/Anxiety (Goal: manage symptoms) -Relatively Controlled -pt reports duloxetine is the only drug that has ever helped her depression; pt reports family hx of depression (mother and father, brother); she reports 34 year hx of depression; she complains of dry mouth - Biotene, lozenges help some -trouble falling asleep, but sleeps well once she is asleep -Current treatment: Duloxetine 30 mg daily Duloxetine 60 mg - 2 tab daily Bupropion XL 150 mg daily AM Diazepam 10 mg HS -Medications previously tried/failed: aripiprazole, quetiapine, doxepin  -PHQ9: 10 (02/2021) -GAD7: not on file -Connected with Katherine Chapel, NP for mental health support -Educated on Benefits of medication for symptom control;  -Counseled dry mouth is likely due to high dose duloxetine; discussed benefits outweigh risks/side effects at this time -Recommended to continue current medication  Lewy Body dementia with behavioral disturbances (Goal:  manage symptoms) -Relatively Controlled - pt reports Nuplazid has "done miracles" for her, but does think she is starting to build a tolerance -Per neurology - Nuplazid does not appear to be controlling delusions any longer, pt does not appear to have good insight; may consider atypical antipsychotics (haloperidol, olanzapine) although these would worsen parkinsonism -Current treatment  Nuplazid 34 mg daily HS (via PAP) -Medications previously tried: quetiapine, rivastigmine, levodopa  -Recommended to continue current medication   Osteopenia (Goal prevent fractures) -Not ideally controlled - pt is not taking calcium or vitamin D; she has DEXA scan scheduled next month -Last DEXA Scan: 06/01/18   T-Score femoral neck: -1.5  T-Score total hip: -1.4  T-Score lumbar spine: -0.2  10-year probability of major osteoporotic fracture: 28.1%  10-year probability of hip fracture: 3.2% -Patient is a candidate for pharmacologic treatment due to T-Score -1.0 to -2.5 and 10-year risk of major osteoporotic fracture > 20% and T-Score -1.0 to -2.5 and 10-year risk of hip fracture > 3% -Current treatment  None -Medications previously tried: alendronate (GI)  -Recommend [id number] units of vitamin D daily. Recommend 1200 mg of calcium daily from dietary and supplemental sources.  -Consider pharmacologic treatment based on DEXA results next month  Migraines (Goal: reduce migraine frequency) -Controlled - pt reports rare migraines since starting topiramate, she used to have them once a week -Current treatment  Topiramate 50 mg daily Sumatriptan 100 mg PRN - rare use -Recommended to continue current medication  Back pain (Goal: manage symptoms) -Controlled - pt reports overall back pain is improving; she using gabapentin PRN and Aleve daily; she is worried about Tylenol and liver issues because her father died from non-alcoholic liver disease -Hx 2 back surgeries 2018 -Current treatment  Gabapentin 300 mg  HS - PRN Tylenol 325 mg PRN Aleve OTC Duloxetine 150 mg/day -Counseled on risks of chronic NSAIDs (GI bleeding, hypertension, kidney damage);  -Educated on NALD; Tylenol liver risks; max daily dose of 3000 mg/day;  -Advised to use Tylenol daily and Aleve infrequently  Urinary voiding difficulty (Goal: manage symptoms) -Not ideally controlled - pt reports tamsulosin has helped with voiding difficulties but it is still a major issue; she is starting pelvic floor PT in December and hopeful  that this will help -Current treatment  Tamsulosin 0.4 mg daily -Recommended to continue current medication  Iron deficiency anemia (Goal: manage symptoms) -Uncontrolled - pt reports she is not taking iron supplements, she had significant constipation with them int he past and she would like another iron infusion -Current treatment  None -Will coordinate with PCP to set up iron infusion  GERD (Goal: manage symptoms) -Controlled - pt reports dairy products trigger GERD; she takes PPI daily and has occasional breakthrough issue for which she takes famotidine -Current treatment  Pantoprazole 40 mg daily Famotidine 20 mg PRN -Counseled on long term PPI risks; pt is not interested in stopping PPI -Recommended to continue current medication  Health Maintenance -Vaccine gaps: Prevnar, Covid booster, Flu -Pt reports [facility] the [facility] month or so. Will get  -Current therapy:  Multivitamin [employer] vitamin (hair/skin/nails) Fiber supplement -Patient is satisfied with current therapy and denies issues -Recommended to continue current medication  Patient Goals/Self-Care Activities Patient will:  - take medications as prescribed as evidenced by patient report and record review focus on medication adherence by pill box check blood pressure daily, document, and provide at future appointments -Stop daily Aleve, take Tylenol instead (up to 3000 mg/day)       Medication Assistance:  Nuplazid obtained through medication assistance program.   Compliance/Adherence/Medication fill history: Care Gaps: Mammogram (due 06/01/20) - scheduled for Dec 2022  Star-Rating Drugs: Simvastatin - LF 11/19/20 x 90 ds, PDC 82%; pt was off of this for several months but restarted Oct 2022  Patient[facility]  [id number] [place] [place]Phone: [phone]: [phone] Uses pill box? Yes - husband fills it each week Pt endorses 100% compliance  We discussed: Current pharmacy is preferred with insurance plan and patient is satisfied with pharmacy services Patient decided to: Continue current medication management strategy  Care Plan and Follow Up Patient Decision:  Patient agrees to Care Plan and Follow-up.  Plan: Telephone follow up appointment with care management team member scheduled for:  2 months  LCharlene Brooke PharmD, BOld Moultrie Surgical Center IncClinical Pharmacist LRed LodgePrimary Care at [phone]

## 2021-06-17 ENCOUNTER — Other Ambulatory Visit: Payer: Self-pay | Admitting: Primary Care

## 2021-06-17 DIAGNOSIS — D509 Iron deficiency anemia, unspecified: Secondary | ICD-10-CM

## 2021-06-17 DIAGNOSIS — F02818 Dementia in other diseases classified elsewhere, unspecified severity, with other behavioral disturbance: Secondary | ICD-10-CM

## 2021-06-17 DIAGNOSIS — E782 Mixed hyperlipidemia: Secondary | ICD-10-CM

## 2021-06-17 DIAGNOSIS — G3183 Dementia with Lewy bodies: Secondary | ICD-10-CM | POA: Diagnosis not present

## 2021-06-17 DIAGNOSIS — I10 Essential (primary) hypertension: Secondary | ICD-10-CM | POA: Diagnosis not present

## 2021-06-18 ENCOUNTER — Telehealth: Payer: Self-pay | Admitting: Pharmacy Technician

## 2021-06-18 NOTE — Telephone Encounter (Signed)
Thank you :)

## 2021-06-18 NOTE — Telephone Encounter (Signed)
FYI NOTE:  Auth Submission: no auth needed Payer: medicare Medication & CPT/J Code(s) submitted: Feraheme (ferumoxytol) [id number] Route of submission (phone, fax, portal): phone [phone] Auth type: Buy/Bill Units/visits requested: 2 Approval from: 05/19/21 to 09/16/21  Patient will be scheduled as soon as possible

## 2021-06-22 ENCOUNTER — Telehealth: Payer: Self-pay

## 2021-06-22 NOTE — Progress Notes (Signed)
Chronic Care Management Pharmacy Assistant   Name: Katherine [name]  MRN: [id number] DOB: [birth year]  Reason for Encounter: CCM (General Adherence)   Recent office visits:  None since last CCM contact  Recent consult visits:  None since last CCM contact  Hospital visits:  None since last CCM contact  Medications: Outpatient Encounter Medications as of 06/22/2021  Medication Sig   acetaminophen (TYLENOL) 325 MG tablet Take 650 mg by mouth every 6 (six) hours as needed.   buPROPion (WELLBUTRIN XL) 150 MG 24 hr tablet Take 1 tablet by mouth every morning.   diazepam (VALIUM) 10 MG tablet Take 10 mg by mouth at bedtime.   DULoxetine (CYMBALTA) 30 MG capsule Take 30 mg by mouth daily. (Total dose = 150 mg)   DULoxetine (CYMBALTA) 60 MG capsule Take 120 mg by mouth every morning. (Total dose = 150 mg)   famotidine (PEPCID) 20 MG tablet TAKE 1 TABLET (20 MG TOTAL) BY MOUTH DAILY. FOR HEARTBURN.   gabapentin (NEURONTIN) 300 MG capsule TAKE 1 CAPSULE (300 MG TOTAL) BY MOUTH AT BEDTIME. FOR BACK PAIN. (Patient taking differently: Take 300 mg by mouth at bedtime. For back pain PRN)   Multiple Vitamin (MULTIVITAMIN) tablet Take 1 tablet by mouth daily.   NUPLAZID 34 MG CAPS Take 1 capsule by mouth daily.   OVER THE COUNTER MEDICATION [employer] vitamin   OVER THE COUNTER MEDICATION 2 tablets daily. Air Shield   pantoprazole (PROTONIX) 40 MG tablet Take 1 tablet (40 mg total) by mouth daily. For heartburn.   simvastatin (ZOCOR) 20 MG tablet Take 20 mg by mouth daily.   spironolactone (ALDACTONE) 25 MG tablet Take 1 tablet (25 mg total) by mouth daily.   sulfamethoxazole-trimethoprim (BACTRIM DS) 800-160 MG tablet Take 1 tablet by mouth 2 (two) times daily. For urinary tract infection.   SUMAtriptan (IMITREX) 100 MG tablet Take 100 mg by mouth as needed.   tamsulosin (FLOMAX) 0.4 MG CAPS capsule Take 0.4 mg by mouth daily.   topiramate (TOPAMAX) 50 MG tablet Take 50 mg by mouth daily.     No facility-administered encounter medications on file as of 06/22/2021.    Contacted Katherine [name] on 06/22/2021 for general disease state and medication adherence call.   Patient is > 5 days past due for refill on the following medications per chart history:  Star Medications: Medication Name/mg Last Fill Days Supply Simvastatin 20 mg  11/19/2020 90  Spoke with Patient's husband Katherine [name]) for the encounter as patient was sleeping.   What concerns do you have about your medications? Patient's husband stated they do not have any concerns.   The patient denies side effects with her medications. Patient's husband denied any side effects.   How often do you forget or accidentally miss a dose? Never  Do you use a pillbox? Yes  Are you having any problems getting your medications from your pharmacy? No  Has the cost of your medications been a concern? No  Since last visit with CPP, no interventions have been made:   The patient has not had an ED visit since last contact.   The patient denies problems with their health. Patient's husband denied any problems with patient's health. Stated the patient gets really tired with any activity. Stated they had a birthday dinner to go to last night that wore the patient out so she will sleep all day.   Patient's husband denied any concerns or questions for Katherine [name], Pharm. D at this  time.   Care Gaps: Annual wellness visit in last year? No Most Recent BP reading: 126/78 on 06/04/2021  Mammogram appointment scheduled for 07/24/2020 Dexa appointment scheduled for 07/30/2020  Katherine [name], CPP notified  Katherine [name], [place] [phone]  Time Spent:  38 Minutes

## 2021-06-23 ENCOUNTER — Ambulatory Visit: Payer: Medicare Other

## 2021-06-23 ENCOUNTER — Other Ambulatory Visit: Payer: Self-pay | Admitting: Pharmacy Technician

## 2021-06-23 MED ORDER — DIPHENHYDRAMINE HCL 50 MG/ML IJ SOLN: 50.0000 mg | Freq: Once | INTRAMUSCULAR | Status: DC | PRN

## 2021-06-23 MED ORDER — FAMOTIDINE IN NACL 20-0.9 MG/50ML-% IV SOLN: 20.0000 mg | Freq: Once | INTRAVENOUS | Status: DC | PRN

## 2021-06-23 MED ORDER — METHYLPREDNISOLONE SODIUM SUCC 125 MG IJ SOLR: 125.0000 mg | Freq: Once | INTRAMUSCULAR | Status: DC | PRN

## 2021-06-23 MED ORDER — EPINEPHRINE 0.3 MG/0.3ML IJ SOAJ: 0.3000 mg | Freq: Once | INTRAMUSCULAR | Status: DC | PRN

## 2021-06-23 MED ORDER — ALBUTEROL SULFATE HFA 108 (90 BASE) MCG/ACT IN AERS: 2.0000 | INHALATION_SPRAY | Freq: Once | RESPIRATORY_TRACT | Status: DC | PRN

## 2021-06-23 MED ORDER — SODIUM CHLORIDE 0.9 % IV SOLN: Freq: Once | INTRAVENOUS | Status: DC | PRN

## 2021-06-23 MED ORDER — SODIUM CHLORIDE 0.9 % IV SOLN
510.0000 mg | Freq: Once | INTRAVENOUS | Status: DC
  Filled 2021-06-23: qty 17

## 2021-06-24 ENCOUNTER — Encounter: Payer: Self-pay | Admitting: Primary Care

## 2021-06-25 ENCOUNTER — Ambulatory Visit (INDEPENDENT_AMBULATORY_CARE_PROVIDER_SITE_OTHER): Payer: Medicare Other

## 2021-06-25 ENCOUNTER — Other Ambulatory Visit: Payer: Self-pay

## 2021-06-25 VITALS — BP 133/72 | HR 92 | Temp 97.4°F | Resp 16 | Ht 61.0 in | Wt 177.0 lb

## 2021-06-25 DIAGNOSIS — D509 Iron deficiency anemia, unspecified: Secondary | ICD-10-CM | POA: Diagnosis not present

## 2021-06-25 MED ORDER — DIPHENHYDRAMINE HCL 50 MG/ML IJ SOLN: 50.0000 mg | Freq: Once | INTRAMUSCULAR | Status: DC | PRN

## 2021-06-25 MED ORDER — SODIUM CHLORIDE 0.9 % IV SOLN: Freq: Once | INTRAVENOUS | Status: DC | PRN

## 2021-06-25 MED ORDER — METHYLPREDNISOLONE SODIUM SUCC 125 MG IJ SOLR: 125.0000 mg | Freq: Once | INTRAMUSCULAR | Status: DC | PRN

## 2021-06-25 MED ORDER — FAMOTIDINE IN NACL 20-0.9 MG/50ML-% IV SOLN: 20.0000 mg | Freq: Once | INTRAVENOUS | Status: DC | PRN

## 2021-06-25 MED ORDER — ALBUTEROL SULFATE HFA 108 (90 BASE) MCG/ACT IN AERS: 2.0000 | INHALATION_SPRAY | Freq: Once | RESPIRATORY_TRACT | Status: DC | PRN

## 2021-06-25 MED ORDER — SODIUM CHLORIDE 0.9 % IV SOLN
510.0000 mg | Freq: Once | INTRAVENOUS | Status: AC
  Administered 2021-06-25: 510 mg via INTRAVENOUS
  Filled 2021-06-25: qty 17

## 2021-06-25 MED ORDER — EPINEPHRINE 0.3 MG/0.3ML IJ SOAJ: 0.3000 mg | Freq: Once | INTRAMUSCULAR | Status: DC | PRN

## 2021-06-25 NOTE — Progress Notes (Signed)
Diagnosis: Iron Deficiency Anemia  Provider:  Marshell Garfinkel, MD  Procedure: Infusion  IV Type: Peripheral, IV Location: R Forearm  Feraheme (Ferumoxytol), Dose: 510 mg  Infusion Start Time: 5331  Infusion Stop Time: 1406  Post Infusion IV Care: Peripheral IV Discontinued  Discharge: Condition: Good, Destination: Home . AVS provided to patient.   Performed by:  Koren Shiver, RN

## 2021-07-02 ENCOUNTER — Ambulatory Visit: Payer: Medicare Other

## 2021-07-02 MED ORDER — FAMOTIDINE IN NACL 20-0.9 MG/50ML-% IV SOLN: 20.0000 mg | Freq: Once | INTRAVENOUS | Status: DC | PRN

## 2021-07-02 MED ORDER — SODIUM CHLORIDE 0.9 % IV SOLN
510.0000 mg | Freq: Once | INTRAVENOUS | Status: DC
  Filled 2021-07-02: qty 17

## 2021-07-02 MED ORDER — SODIUM CHLORIDE 0.9 % IV SOLN: Freq: Once | INTRAVENOUS | Status: DC | PRN

## 2021-07-02 MED ORDER — DIPHENHYDRAMINE HCL 50 MG/ML IJ SOLN: 50.0000 mg | Freq: Once | INTRAMUSCULAR | Status: DC | PRN

## 2021-07-02 MED ORDER — METHYLPREDNISOLONE SODIUM SUCC 125 MG IJ SOLR: 125.0000 mg | Freq: Once | INTRAMUSCULAR | Status: DC | PRN

## 2021-07-02 MED ORDER — EPINEPHRINE 0.3 MG/0.3ML IJ SOAJ: 0.3000 mg | Freq: Once | INTRAMUSCULAR | Status: DC | PRN

## 2021-07-02 MED ORDER — ALBUTEROL SULFATE HFA 108 (90 BASE) MCG/ACT IN AERS: 2.0000 | INHALATION_SPRAY | Freq: Once | RESPIRATORY_TRACT | Status: DC | PRN

## 2021-07-03 ENCOUNTER — Ambulatory Visit: Payer: Medicare Other

## 2021-07-03 ENCOUNTER — Other Ambulatory Visit: Payer: Medicare Other

## 2021-07-03 ENCOUNTER — Encounter: Payer: Self-pay | Admitting: Primary Care

## 2021-07-03 ENCOUNTER — Other Ambulatory Visit: Payer: Self-pay | Admitting: Pharmacy Technician

## 2021-07-08 ENCOUNTER — Other Ambulatory Visit: Payer: Self-pay

## 2021-07-08 ENCOUNTER — Ambulatory Visit (INDEPENDENT_AMBULATORY_CARE_PROVIDER_SITE_OTHER): Payer: Medicare Other

## 2021-07-08 VITALS — BP 99/69 | HR 90 | Temp 98.0°F | Resp 16 | Ht 61.0 in | Wt 179.2 lb

## 2021-07-08 DIAGNOSIS — D509 Iron deficiency anemia, unspecified: Secondary | ICD-10-CM | POA: Diagnosis not present

## 2021-07-08 MED ORDER — SODIUM CHLORIDE 0.9 % IV SOLN
510.0000 mg | Freq: Once | INTRAVENOUS | Status: AC
  Administered 2021-07-08: 15:00:00 510 mg via INTRAVENOUS
  Filled 2021-07-08: qty 17

## 2021-07-08 MED ORDER — EPINEPHRINE 0.3 MG/0.3ML IJ SOAJ: 0.3000 mg | Freq: Once | INTRAMUSCULAR | Status: DC | PRN

## 2021-07-08 MED ORDER — FAMOTIDINE IN NACL 20-0.9 MG/50ML-% IV SOLN: 20.0000 mg | Freq: Once | INTRAVENOUS | Status: DC | PRN

## 2021-07-08 MED ORDER — METHYLPREDNISOLONE SODIUM SUCC 125 MG IJ SOLR: 125.0000 mg | Freq: Once | INTRAMUSCULAR | Status: DC | PRN

## 2021-07-08 MED ORDER — DIPHENHYDRAMINE HCL 50 MG/ML IJ SOLN: 50.0000 mg | Freq: Once | INTRAMUSCULAR | Status: DC | PRN

## 2021-07-08 MED ORDER — SODIUM CHLORIDE 0.9 % IV SOLN: Freq: Once | INTRAVENOUS | Status: DC | PRN

## 2021-07-08 MED ORDER — ALBUTEROL SULFATE HFA 108 (90 BASE) MCG/ACT IN AERS: 2.0000 | INHALATION_SPRAY | Freq: Once | RESPIRATORY_TRACT | Status: DC | PRN

## 2021-07-08 NOTE — Progress Notes (Signed)
Diagnosis: Iron Deficiency Anemia  Provider:  Marshell Garfinkel, MD  Procedure: Infusion  IV Type: Peripheral, IV Location: R Antecubital  Feraheme (Ferumoxytol), Dose: 510 mg  Infusion Start Time: 3014  Infusion Stop Time: 8403  Post Infusion IV Care: Peripheral IV Discontinued  Discharge: Condition: Good, Destination: Home . AVS provided to patient.   Performed by:  Koren Shiver, RN

## 2021-07-09 ENCOUNTER — Telehealth: Payer: Self-pay | Admitting: Primary Care

## 2021-07-09 DIAGNOSIS — D509 Iron deficiency anemia, unspecified: Secondary | ICD-10-CM

## 2021-07-09 NOTE — Telephone Encounter (Addendum)
Yes, recommend repeat iron levels in 6 weeks.  Lab only appt. Will order lab.

## 2021-07-09 NOTE — Addendum Note (Signed)
Addended by: [name] on: 07/09/2021 03:35 PM   Modules accepted: Orders

## 2021-07-09 NOTE — Telephone Encounter (Signed)
Pt called stating that she went to [facility]. Pt states that they stated that Carlis Abbott would probably want to check pt Iron level. Pt is asking do she need to come in for lab work. Please advise.

## 2021-07-14 ENCOUNTER — Telehealth: Payer: Self-pay | Admitting: Primary Care

## 2021-07-14 NOTE — Telephone Encounter (Signed)
Left message to return call to our office.  

## 2021-07-14 NOTE — Telephone Encounter (Addendum)
In response to Charlene Brooke, [facility] message:  Need to obtain updated iron labs prior to receiving another infusion. Future iron level is pending. It looks like Joellen attempted to reach patient regarding this on 07/09/21, patient did not answer. Regarding her bone density scan, it looks like this was being followed by her GYN who ordered the last one in November 2019. Repeat Dexa scan is scheduled for 07/30/21 and will need to await results prior to treatment initiation.

## 2021-07-15 ENCOUNTER — Telehealth (INDEPENDENT_AMBULATORY_CARE_PROVIDER_SITE_OTHER): Payer: Medicare Other | Admitting: Family

## 2021-07-15 ENCOUNTER — Ambulatory Visit: Payer: Medicare Other | Admitting: Family Medicine

## 2021-07-15 ENCOUNTER — Encounter: Payer: Self-pay | Admitting: Family

## 2021-07-15 VITALS — BP 128/70 | Ht 63.0 in | Wt 175.0 lb

## 2021-07-15 DIAGNOSIS — N760 Acute vaginitis: Secondary | ICD-10-CM

## 2021-07-15 MED ORDER — FLUCONAZOLE 150 MG PO TABS: 150.0000 mg | ORAL_TABLET | Freq: Once | ORAL | 0 refills | Status: AC

## 2021-07-15 NOTE — Progress Notes (Signed)
Virtual Visit via Video   I connected with patient on 07/15/21 at  2:20 PM EST by a video enabled telemedicine application and verified that I am speaking with the correct person using two identifiers.  Location patient: Home Location provider: [employer], Office Persons participating in the virtual visit: Patient, Provider, CMA  I discussed the limitations of evaluation and management by telemedicine and the availability of in person appointments. The patient expressed understanding and agreed to proceed.  Subjective:   HPI:   70 year old female presents with concerns of vaginal irritation and discharge x 3 days. She reports smelling yeasty. She denies any fishy or musty odor. She has vaginal     ROS:   See pertinent positives and negatives per HPI.  Patient Active Problem List   Diagnosis Date Noted   Dysuria 03/10/2021   Vitamin B 12 deficiency 03/10/2021   Recurrent falls 03/10/2021   Hematuria 10/28/2020   Pelvic fullness in female 10/07/2020   Toe pain 08/14/2020   Lewy body dementia ([place]) 04/15/2020   Urinary incontinence 04/15/2020   Recurrent cystitis 02/13/2020   Cough 02/06/2020   Parkinson's disease ([place]) 07/11/2019   Leukocytosis 07/11/2019   Rash and nonspecific skin eruption 02/14/2019   Dizziness 12/14/2018   Osteopenia 07/07/2018   Gastroesophageal reflux disease 03/13/2018   Chronic migraine without aura without status migrainosus, not intractable 03/13/2018   Constipation 02/15/2017   Spondylolisthesis of lumbar region 07/15/2016   Vitamin D deficiency 03/11/2016   Lumbar stenosis with neurogenic claudication 01/12/2016   Notalgia 01/08/2016   Restless leg syndrome 03/13/2015   Preventative health care 12/10/2014   Other fatigue 11/04/2014   Anemia 11/04/2014   Depression 10/31/2014   Prediabetes 10/31/2014   Obstructive sleep apnea 05/01/2009   Hyperlipidemia 09/04/2008   Obesity 09/04/2008   HYPERTENSION, BENIGN 09/04/2008     Social History   Tobacco Use   Smoking status: Never   Smokeless tobacco: Never  Substance Use Topics   Alcohol use: No    Current Outpatient Medications:    acetaminophen (TYLENOL) 325 MG tablet, Take 650 mg by mouth every 6 (six) hours as needed., Disp: , Rfl:    buPROPion (WELLBUTRIN XL) 150 MG 24 hr tablet, Take 1 tablet by mouth every morning., Disp: , Rfl:    diazepam (VALIUM) 10 MG tablet, Take 10 mg by mouth at bedtime., Disp: , Rfl: 2   DULoxetine (CYMBALTA) 30 MG capsule, Take 30 mg by mouth daily. (Total dose = 150 mg), Disp: , Rfl:    DULoxetine (CYMBALTA) 60 MG capsule, Take 120 mg by mouth every morning. (Total dose = 150 mg), Disp: , Rfl:    famotidine (PEPCID) 20 MG tablet, TAKE 1 TABLET (20 MG TOTAL) BY MOUTH DAILY. FOR HEARTBURN., Disp: 90 tablet, Rfl: 1   fluconazole (DIFLUCAN) 150 MG tablet, Take 1 tablet (150 mg total) by mouth once for 1 dose., Disp: 1 tablet, Rfl: 0   gabapentin (NEURONTIN) 300 MG capsule, TAKE 1 CAPSULE (300 MG TOTAL) BY MOUTH AT BEDTIME. FOR BACK PAIN. (Patient taking differently: Take 300 mg by mouth at bedtime. For back pain PRN), Disp: 90 capsule, Rfl: 2   Multiple Vitamin (MULTIVITAMIN) tablet, Take 1 tablet by mouth daily., Disp: , Rfl:    NUPLAZID 34 MG CAPS, Take 1 capsule by mouth daily., Disp: , Rfl:    OVER THE COUNTER MEDICATION, [employer] vitamin, Disp: , Rfl:    OVER THE COUNTER MEDICATION, 2 tablets daily. [employer],  Disp: , Rfl:    pantoprazole (PROTONIX) 40 MG tablet, Take 1 tablet (40 mg total) by mouth daily. For heartburn., Disp: 90 tablet, Rfl: 3   simvastatin (ZOCOR) 20 MG tablet, Take 20 mg by mouth daily., Disp: , Rfl:    spironolactone (ALDACTONE) 25 MG tablet, Take 1 tablet (25 mg total) by mouth daily., Disp: 90 tablet, Rfl: 3   tamsulosin (FLOMAX) 0.4 MG CAPS capsule, Take 0.4 mg by mouth daily., Disp: , Rfl:    topiramate (TOPAMAX) 50 MG tablet, Take 50 mg by mouth daily. , Disp: , Rfl:     sulfamethoxazole-trimethoprim (BACTRIM DS) 800-160 MG tablet, Take 1 tablet by mouth 2 (two) times daily. For urinary tract infection. (Patient not taking: Reported on 07/15/2021), Disp: 10 tablet, Rfl: 0   SUMAtriptan (IMITREX) 100 MG tablet, Take 100 mg by mouth as needed., Disp: , Rfl:   Allergies  Allergen Reactions   Dilaudid [Hydromorphone]     hallucinations   Codeine Anxiety   Darvon Anxiety   Hydrocodone Anxiety   Meperidine Anxiety   Oxycodone Anxiety   Tramadol Anxiety   Victoza [Liraglutide] Nausea Only and Other (See Comments)    And weakness    Objective:   BP 128/70 (BP Location: Left Arm, Patient Position: Sitting, Cuff Size: Normal)    Ht 5\' 3"  (1.6 m)    Wt 175 lb (79.4 kg)    BMI 31.00 kg/m   Patient is well-developed, well-nourished in no acute distress.  Resting comfortably at home.  Head is normocephalic, atraumatic.  No labored breathing.  Speech is clear and coherent with logical content.  Patient is alert and oriented at baseline.    Assessment and Plan:   Katherine [name] was seen today for acute visit.  Diagnoses and all orders for this visit:  Acute vaginitis  Other orders -     fluconazole (DIFLUCAN) 150 MG tablet; Take 1 tablet (150 mg total) by mouth once for 1 dose.  Call the office if symptoms worsen or persist. Recheck as scheduled.     Kennyth Arnold, FNP 07/15/2021

## 2021-07-16 NOTE — Telephone Encounter (Signed)
Spoke with Katherine [name] and scheduled lab only appointment 08/20/2021 at 2:15 pm [facility]) for repeat iron levels.

## 2021-07-16 NOTE — Telephone Encounter (Signed)
Spoke with Ms. Metter and scheduled lab only appointment 08/20/2021 at 2:15 pm [facility]).

## 2021-07-23 ENCOUNTER — Ambulatory Visit: Payer: Medicare Other | Admitting: Primary Care

## 2021-07-24 ENCOUNTER — Telehealth: Payer: Self-pay

## 2021-07-24 ENCOUNTER — Inpatient Hospital Stay: Admission: RE | Admit: 2021-07-24 | Payer: Medicare Other | Source: Ambulatory Visit

## 2021-07-24 NOTE — Telephone Encounter (Signed)
Tried to call pt but the call was forwarded to VM, LM to call back

## 2021-07-24 NOTE — Telephone Encounter (Signed)
[place] [place] Night - Client Nonclinical Telephone Record  [facility] Client [facility] Night - Client Client Site [place] - Night Provider Alma Friendly - NP Contact Type Call Who Is Calling Patient / Member / Family / Caregiver Caller Name [place] Phone Number [phone] Patient Name Katherine [name] Patient DOB [birth year] Call Type Message Only Information Provided Reason for Call Request for General Office Information Initial Comment Caller states she had an appointment to have lab work. Could the caller get her lab work done at the [facility] since it is more convenient. Could the caller make a tele-health appointment after the results are ready? Additional Comment Caller states she would like to have her lab work done at the [facility]. It is more convenient and caller does not always have transportation. Then could the caller speak to Alma Friendly, NP by phone concerning the results? Disp. Time Disposition Final User 07/23/2021 5:13:16 PM General Information Provided Yes [place], [place] Call Closed By: Bonnita Nasuti Transaction Date/Time: 07/23/2021 5:07:44 PM (ET

## 2021-07-26 ENCOUNTER — Other Ambulatory Visit: Payer: Self-pay | Admitting: Primary Care

## 2021-07-26 DIAGNOSIS — K219 Gastro-esophageal reflux disease without esophagitis: Secondary | ICD-10-CM

## 2021-07-30 ENCOUNTER — Ambulatory Visit: Payer: Medicare Other | Admitting: Primary Care

## 2021-07-30 ENCOUNTER — Other Ambulatory Visit: Payer: Medicare Other

## 2021-07-30 ENCOUNTER — Telehealth: Payer: Self-pay

## 2021-07-30 NOTE — Progress Notes (Signed)
° ° °  Chronic Care Management Pharmacy Assistant   Name: KASSEY LAFOREST  MRN: [id number] DOB: [birth year]  Reason for Encounter: CCM (Rescheduled Appointment)   Medications: Outpatient Encounter Medications as of 07/30/2021  Medication Sig   acetaminophen (TYLENOL) 325 MG tablet Take 650 mg by mouth every 6 (six) hours as needed.   buPROPion (WELLBUTRIN XL) 150 MG 24 hr tablet Take 1 tablet by mouth every morning.   diazepam (VALIUM) 10 MG tablet Take 10 mg by mouth at bedtime.   DULoxetine (CYMBALTA) 30 MG capsule Take 30 mg by mouth daily. (Total dose = 150 mg)   DULoxetine (CYMBALTA) 60 MG capsule Take 120 mg by mouth every morning. (Total dose = 150 mg)   famotidine (PEPCID) 20 MG tablet TAKE 1 TABLET (20 MG TOTAL) BY MOUTH DAILY. FOR HEARTBURN.   gabapentin (NEURONTIN) 300 MG capsule TAKE 1 CAPSULE (300 MG TOTAL) BY MOUTH AT BEDTIME. FOR BACK PAIN. (Patient taking differently: Take 300 mg by mouth at bedtime. For back pain PRN)   Multiple Vitamin (MULTIVITAMIN) tablet Take 1 tablet by mouth daily.   NUPLAZID 34 MG CAPS Take 1 capsule by mouth daily.   OVER THE COUNTER MEDICATION [employer] vitamin   OVER THE COUNTER MEDICATION 2 tablets daily. Air Shield   pantoprazole (PROTONIX) 40 MG tablet Take 1 tablet (40 mg total) by mouth daily. For heartburn.   simvastatin (ZOCOR) 20 MG tablet Take 20 mg by mouth daily.   spironolactone (ALDACTONE) 25 MG tablet Take 1 tablet (25 mg total) by mouth daily.   sulfamethoxazole-trimethoprim (BACTRIM DS) 800-160 MG tablet Take 1 tablet by mouth 2 (two) times daily. For urinary tract infection. (Patient not taking: Reported on 07/15/2021)   SUMAtriptan (IMITREX) 100 MG tablet Take 100 mg by mouth as needed.   tamsulosin (FLOMAX) 0.4 MG CAPS capsule Take 0.4 mg by mouth daily.   topiramate (TOPAMAX) 50 MG tablet Take 50 mg by mouth daily.    No facility-administered encounter medications on file as of 07/30/2021.    [name] was  contacted to remind of upcoming telephone visit with Charlene Brooke. Patient asked to reschedule her appointment. Patient will be at the [facility] from 08/11/2021 - 08/20/2021 and would like to speak with Charlene Brooke when she gets home from that stay. Patient also has eye surgery on 08/24/2021. Patient rescheduled her appointment to 09/02/2021 at 12:00. .   Are you having any problems with your medications? No   Do you have any concerns you like to discuss with the pharmacist? No  Star Rating Drugs: Medication:  Last Fill: Day Supply Simvastatin 20 mg 11/19/2020 90    Fill date verified with [facility]   Charlene Brooke, CPP notified  Marijean Niemann, [place] [phone]  Time Spent: 15 Minutes

## 2021-07-31 ENCOUNTER — Ambulatory Visit: Payer: Medicare Other | Admitting: Primary Care

## 2021-07-31 ENCOUNTER — Inpatient Hospital Stay: Admission: RE | Admit: 2021-07-31 | Payer: Medicare Other | Source: Ambulatory Visit

## 2021-08-03 DIAGNOSIS — G20 Parkinson's disease: Secondary | ICD-10-CM | POA: Diagnosis not present

## 2021-08-03 DIAGNOSIS — F3132 Bipolar disorder, current episode depressed, moderate: Secondary | ICD-10-CM | POA: Diagnosis not present

## 2021-08-06 ENCOUNTER — Telehealth: Payer: Medicare Other

## 2021-08-06 ENCOUNTER — Ambulatory Visit: Payer: Medicare Other | Admitting: Primary Care

## 2021-08-12 DIAGNOSIS — F0280 Dementia in other diseases classified elsewhere without behavioral disturbance: Secondary | ICD-10-CM | POA: Diagnosis not present

## 2021-08-12 DIAGNOSIS — G20 Parkinson's disease: Secondary | ICD-10-CM | POA: Diagnosis not present

## 2021-08-12 DIAGNOSIS — G3183 Dementia with Lewy bodies: Secondary | ICD-10-CM | POA: Diagnosis not present

## 2021-08-13 DIAGNOSIS — G3183 Dementia with Lewy bodies: Secondary | ICD-10-CM | POA: Diagnosis not present

## 2021-08-13 DIAGNOSIS — F0280 Dementia in other diseases classified elsewhere without behavioral disturbance: Secondary | ICD-10-CM | POA: Diagnosis not present

## 2021-08-18 ENCOUNTER — Telehealth: Payer: Self-pay

## 2021-08-18 DIAGNOSIS — F0390 Unspecified dementia without behavioral disturbance: Secondary | ICD-10-CM | POA: Diagnosis not present

## 2021-08-18 DIAGNOSIS — G3183 Dementia with Lewy bodies: Secondary | ICD-10-CM | POA: Diagnosis not present

## 2021-08-18 DIAGNOSIS — F0280 Dementia in other diseases classified elsewhere without behavioral disturbance: Secondary | ICD-10-CM | POA: Diagnosis not present

## 2021-08-18 DIAGNOSIS — R413 Other amnesia: Secondary | ICD-10-CM | POA: Diagnosis not present

## 2021-08-18 NOTE — Telephone Encounter (Signed)
Received faxed note from access nurse ; did not come thru access portal and will send for scanning. Pt wanted to know what the appt for 08/20/21 at 2:15 was for and where pt was to go for appt. Per appt notes pt is scheduled for labs at [facility] office. Left v/m requesting pt to cb to [facility] and whoever answers the phone can give pt info. I spoke with lab at [facility] office and fasting is not required and pt can come in afternoon for testing. Sending note to [facility] CMA.

## 2021-08-18 NOTE — Telephone Encounter (Signed)
Left message to return call to our office.  

## 2021-08-19 NOTE — Telephone Encounter (Signed)
Left message to return call to our office.  When patient calls need to talk to her about this please let me know.

## 2021-08-19 NOTE — Telephone Encounter (Signed)
It looks like patient has a follow up visit with me scheduled for 09/01/21. We can move that up sooner so that we can discuss her urinary symptoms and check her iron labs all in one visit.  She will need an office visit for urinary symptoms.

## 2021-08-19 NOTE — Telephone Encounter (Signed)
I called pt and advised pt has lab draw on 08/20/21 at 2:15 at [facility]. Pt voiced understanding and pt is requesting urinalysis done also at that lab appt. Pt said she is feeling "uncomfortable" with some burning and pain upon urination. Pt does not have urinary frequency or urgency and no abd pain and no fever. Pt said she would just feel better to get U/A done . I advised I would send note to Gentry Fitz NP and Joellen CMA. I will teams Joellen as well.

## 2021-08-20 ENCOUNTER — Other Ambulatory Visit: Payer: Medicare Other

## 2021-08-20 NOTE — Telephone Encounter (Signed)
Pt returning your call

## 2021-08-21 ENCOUNTER — Other Ambulatory Visit: Payer: Self-pay | Admitting: Pharmacy Technician

## 2021-08-21 NOTE — Telephone Encounter (Signed)
Called patient have moved appointment up to next week. She will bring results form outside test done recently for review.

## 2021-08-21 NOTE — Telephone Encounter (Signed)
Noted  

## 2021-08-25 ENCOUNTER — Ambulatory Visit: Payer: Medicare Other | Admitting: Primary Care

## 2021-08-27 ENCOUNTER — Ambulatory Visit (INDEPENDENT_AMBULATORY_CARE_PROVIDER_SITE_OTHER): Payer: Medicare Other | Admitting: Primary Care

## 2021-08-27 ENCOUNTER — Other Ambulatory Visit: Payer: Self-pay

## 2021-08-27 ENCOUNTER — Encounter: Payer: Self-pay | Admitting: Primary Care

## 2021-08-27 ENCOUNTER — Other Ambulatory Visit: Payer: Self-pay | Admitting: Primary Care

## 2021-08-27 VITALS — BP 124/78 | HR 112 | Temp 98.6°F | Ht 63.0 in | Wt 178.0 lb

## 2021-08-27 DIAGNOSIS — G3183 Dementia with Lewy bodies: Secondary | ICD-10-CM | POA: Diagnosis not present

## 2021-08-27 DIAGNOSIS — R300 Dysuria: Secondary | ICD-10-CM | POA: Diagnosis not present

## 2021-08-27 DIAGNOSIS — D509 Iron deficiency anemia, unspecified: Secondary | ICD-10-CM | POA: Diagnosis not present

## 2021-08-27 DIAGNOSIS — N898 Other specified noninflammatory disorders of vagina: Secondary | ICD-10-CM | POA: Insufficient documentation

## 2021-08-27 DIAGNOSIS — F0280 Dementia in other diseases classified elsewhere without behavioral disturbance: Secondary | ICD-10-CM | POA: Diagnosis not present

## 2021-08-27 DIAGNOSIS — G20 Parkinson's disease: Secondary | ICD-10-CM

## 2021-08-27 DIAGNOSIS — L292 Pruritus vulvae: Secondary | ICD-10-CM | POA: Diagnosis not present

## 2021-08-27 LAB — POC URINALSYSI DIPSTICK (AUTOMATED)
Bilirubin, UA: NEGATIVE
Blood, UA: NEGATIVE
Glucose, UA: NEGATIVE
Ketones, UA: NEGATIVE
Nitrite, UA: NEGATIVE
Protein, UA: NEGATIVE
Spec Grav, UA: 1.015 (ref 1.010–1.025)
Urobilinogen, UA: 0.2 E.U./dL
pH, UA: 5.5 (ref 5.0–8.0)

## 2021-08-27 NOTE — Assessment & Plan Note (Signed)
Reviewed notes from [facility] through [facility], looks like she was confirmed to have Parkinson's disease.   Awaiting plan for treatment.

## 2021-08-27 NOTE — Assessment & Plan Note (Signed)
Reviewed CBC from [facility], anemia has resolved.  Will continue to monitor.

## 2021-08-27 NOTE — Assessment & Plan Note (Signed)
Exam today overall benign.  Checking labs today including UA, wet prep, and also gonorrhea/chlamydia per patient request.  UA today with 1+ leuks, otherwise negative. Culture send and pending.

## 2021-08-27 NOTE — Patient Instructions (Addendum)
We will be in touch once we receive your urine and vaginal swab results.  We will see you in 3 months for follow up.   It was a pleasure to see you today!

## 2021-08-27 NOTE — Progress Notes (Signed)
Subjective:    Patient ID: Katherine [name], female    DOB: [birth year], 71 y.o.   MRN: [id number]  HPI  Katherine [name] is a very pleasant 71 y.o. female with a history of recurrent UTI, parkinson's disease, lewy body dementia, chronic back pain, prediabetes, urinary incontinence who presents today to discuss vaginal itching.   She also reports scant amount of vaginal discharge and one episode of hematuria. The discharge is whitish. Symptoms began 2-3 weeks ago.  She's concerned she contracted a vaginal infection from the woman for which her husband is having an affair. The affair began 4 years ago and continues. She found out about the affair through her bank statement as someone had charged thousands of dollars to her debit card. She went to her bank to have them print out the statement report, the statement reported jewelry, diapers, baby clothes, and lots of items that she did not order or purchase. 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    She saw the woman for which her husband is having an affair at the [facility] a few weeks ago, she sitting in the waiting room. She endorses that her husband flew this woman up to [facility] so that he could go to her room after she fell asleep each night. This woman comes to her home in the middle of the night to take the patients belongings. She's noticed missing clothing, dishes, and other objects.   She recently returned from the [facility] for further insight into her Parkinson's Disease and Lewy Body dementia. She underwent numerous testing. She will receive a phone call in early March to discuss her results. She saw some results which did confirm her Lewy Body dementia.   She would like STD testing.    Review of Systems  Constitutional:  Negative for fever.  Genitourinary:  Positive for hematuria and vaginal discharge. Negative for dysuria, frequency and vaginal bleeding.       Vaginal itching        Past Medical History:   Diagnosis Date   Anemia 2018   Anxiety    Arthritis    Borderline diabetes    Chest pain    a. 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 [facility]   Depression    Dyspnea on exertion    a. 03/2009 Echo: EF 65%, no rwma, triv TR.   GERD (gastroesophageal reflux disease)    High cholesterol    Hypertension    Hypokalemia    Migraine headache    Obesity    Parkinson's disease ([place])    Pre-diabetes    okay now     Social History   Socioeconomic History   Marital status: Married    Spouse name: Not on file   Number of children: Not on file   Years of education: Not on file   Highest education level: Not on file  Occupational History   Occupation: Product manager: [employer]  Tobacco Use   Smoking status: Never   Smokeless tobacco: Never  Vaping Use   Vaping Use: Never used  Substance and Sexual Activity   Alcohol use: No   Drug use: No   Sexual activity: Yes    Partners: Male    Birth control/protection: Post-menopausal  Other Topics Concern  Not on file  Social History Narrative   Married.   One son is 53, lives in [place].   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   Enjoys substitute teaching, gardening, cooking.   Social Determinants of Health   Financial Resource Strain: Not on file  Food Insecurity: Not on file  Transportation Needs: Not on file  Physical Activity: Not on file  Stress: Not on file  Social Connections: Not on file  Intimate Partner Violence: Not on file    Past Surgical History:  Procedure Laterality Date   BLADDER SUSPENSION  2005   [place] N/A 01/11/2020   Procedure: CLOSED REDUCTION NASAL FRACTURE;  Surgeon: Brianah Gust, MD;  Location: [facility];  Service: ENT;  Laterality: N/A;    ENDOMETRIAL ABLATION  2007   FOOT SURGERY     bilateral bunions   LUMBAR LAMINECTOMY/DECOMPRESSION MICRODISCECTOMY Bilateral 01/12/2016   Procedure: Bilateral L4-5 Laminotomy/Foraminotomy;  Surgeon: Newman Pies, MD;  Location: [facility];  Service: Neurosurgery;  Laterality: Bilateral;  Bilateral L4-5 Laminotomy/Foraminotomy   TONSILLECTOMY  1971    Family History  Problem Relation Age of Onset   Heart disease Mother    Hypertension Mother    Heart failure Mother        CHF   Diabetes Mother    Cancer Mother        CERVICAL CANCER   Heart disease Father    Diabetes Father    Liver disease Father    Hypertension Father    Diabetes Sister    Breast cancer Sister 87   Heart disease Brother    Heart failure Brother        CHF   Diabetes Brother    Cancer Brother 70       THROAT AND NECK   Esophageal cancer Brother    Cancer Maternal Grandfather    Colon cancer Neg Hx    Stomach cancer Neg Hx    Rectal cancer Neg Hx     Allergies  Allergen Reactions   Chlorine Other (See Comments)    Smell causes lightheadedness.   Dilaudid [Hydromorphone]     hallucinations   Codeine Anxiety   Darvon Anxiety   Hydrocodone Anxiety   Meperidine Anxiety   Oxycodone Anxiety   Tramadol Anxiety   Victoza [Liraglutide] Nausea Only and Other (See Comments)    And weakness    Current Outpatient Medications on File Prior to Visit  Medication Sig Dispense Refill   acetaminophen (TYLENOL) 325 MG tablet Take 650 mg by mouth every 6 (six) hours as needed.     buPROPion (WELLBUTRIN XL) 150 MG 24 hr tablet Take 1 tablet by mouth every morning.     diazepam (VALIUM) 10 MG tablet Take 10 mg by mouth at bedtime.  2   DULoxetine (CYMBALTA) 30 MG capsule Take 30 mg by mouth daily. (Total dose = 150 mg)     DULoxetine (CYMBALTA) 60 MG capsule Take 120 mg by mouth every morning. (Total dose = 150 mg)     famotidine (PEPCID) 20 MG tablet TAKE 1 TABLET (20 MG TOTAL) BY MOUTH DAILY. FOR  HEARTBURN. 90 tablet 0   gabapentin (NEURONTIN) 300 MG capsule TAKE 1 CAPSULE (300 MG TOTAL) BY MOUTH AT BEDTIME. FOR BACK PAIN. (Patient taking differently: Take 300 mg by mouth at bedtime. For back pain PRN) 90 capsule 2   Multiple Vitamin (MULTIVITAMIN) tablet Take 1 tablet by  mouth daily.     NUPLAZID 34 MG CAPS Take 1 capsule by mouth daily.     OVER THE COUNTER MEDICATION Ol[employer] vitamin     OVER THE COUNTER MEDICATION 2 tablets daily. Air Shield     pantoprazole (PROTONIX) 40 MG tablet Take 1 tablet (40 mg total) by mouth daily. For heartburn. 90 tablet 3   simvastatin (ZOCOR) 20 MG tablet Take 20 mg by mouth daily.     spironolactone (ALDACTONE) 25 MG tablet Take 1 tablet (25 mg total) by mouth daily. 90 tablet 3   SUMAtriptan (IMITREX) 100 MG tablet Take 100 mg by mouth as needed.     tamsulosin (FLOMAX) 0.4 MG CAPS capsule Take 0.4 mg by mouth daily.     topiramate (TOPAMAX) 50 MG tablet Take 50 mg by mouth daily.      No current facility-administered medications on file prior to visit.    BP 124/78    Pulse (!) 112    Temp 98.6 F (37 C) (Temporal)    Ht 5\' 3"  (1.6 m)    Wt 178 lb (80.7 kg)    SpO2 97%    BMI 31.53 kg/m  Objective:   Physical Exam Cardiovascular:     Rate and Rhythm: Normal rate and regular rhythm.  Pulmonary:     Effort: Pulmonary effort is normal.     Breath sounds: Normal breath sounds.  Genitourinary:    Labia:        Right: No tenderness or lesion.        Left: No tenderness or lesion.      Vagina: Vaginal discharge present. No erythema.     Cervix: No cervical motion tenderness.     Comments: Scant amount of whitish vaginal discharge  Musculoskeletal:     Cervical back: Neck supple.  Skin:    General: Skin is warm and dry.          Assessment & Plan:   40 minutes spent face to face with patient, >50% spent counseling or coordinating care. Reviewed records from [facility] through [place]     This visit occurred during the  SARS-CoV-2 public health emergency.  Safety protocols were in place, including screening questions prior to the visit, additional usage of staff PPE, and extensive cleaning of exam room while observing appropriate contact time as indicated for disinfecting solutions.

## 2021-08-27 NOTE — Assessment & Plan Note (Addendum)
Recently evaluated at [facility] for Lewy Body dementia. Reviewed notes and labs from [place], looks like they may have confirmed Lewy Body dementia.  Will await notes from phone call with patient that will be held in early March.  She is convinced that her husband is having an affair and spent most of her visit today discussing this.

## 2021-08-28 ENCOUNTER — Telehealth: Payer: Self-pay

## 2021-08-28 LAB — C. TRACHOMATIS/N. GONORRHOEAE RNA
C. trachomatis RNA, TMA: NOT DETECTED
N. gonorrhoeae RNA, TMA: NOT DETECTED

## 2021-08-28 LAB — WET PREP BY MOLECULAR PROBE
Candida species: NOT DETECTED
Gardnerella vaginalis: NOT DETECTED
MICRO NUMBER:: 12987213
SPECIMEN QUALITY:: ADEQUATE
Trichomonas vaginosis: NOT DETECTED

## 2021-08-28 LAB — URINE CULTURE
MICRO NUMBER:: 12987214
Result:: NO GROWTH
SPECIMEN QUALITY:: ADEQUATE

## 2021-08-28 NOTE — Progress Notes (Signed)
° ° °  Chronic Care Management Pharmacy Assistant   Name: BETHLEHEM LANGSTAFF  MRN: [id number] DOB: October 02, 1950  Reason for Encounter: CCM (Appointment Reminder)   Medications: Outpatient Encounter Medications as of 08/28/2021  Medication Sig   acetaminophen (TYLENOL) 325 MG tablet Take 650 mg by mouth every 6 (six) hours as needed.   buPROPion (WELLBUTRIN XL) 150 MG 24 hr tablet Take 1 tablet by mouth every morning.   diazepam (VALIUM) 10 MG tablet Take 10 mg by mouth at bedtime.   DULoxetine (CYMBALTA) 30 MG capsule Take 30 mg by mouth daily. (Total dose = 150 mg)   DULoxetine (CYMBALTA) 60 MG capsule Take 120 mg by mouth every morning. (Total dose = 150 mg)   famotidine (PEPCID) 20 MG tablet TAKE 1 TABLET (20 MG TOTAL) BY MOUTH DAILY. FOR HEARTBURN.   gabapentin (NEURONTIN) 300 MG capsule TAKE 1 CAPSULE (300 MG TOTAL) BY MOUTH AT BEDTIME. FOR BACK PAIN. (Patient taking differently: Take 300 mg by mouth at bedtime. For back pain PRN)   Multiple Vitamin (MULTIVITAMIN) tablet Take 1 tablet by mouth daily.   NUPLAZID 34 MG CAPS Take 1 capsule by mouth daily.   OVER THE COUNTER MEDICATION Ol[employer] Beauty vitamin   OVER THE COUNTER MEDICATION 2 tablets daily. Air Shield   pantoprazole (PROTONIX) 40 MG tablet Take 1 tablet (40 mg total) by mouth daily. For heartburn.   simvastatin (ZOCOR) 20 MG tablet Take 20 mg by mouth daily.   spironolactone (ALDACTONE) 25 MG tablet Take 1 tablet (25 mg total) by mouth daily.   SUMAtriptan (IMITREX) 100 MG tablet Take 100 mg by mouth as needed.   tamsulosin (FLOMAX) 0.4 MG CAPS capsule Take 0.4 mg by mouth daily.   topiramate (TOPAMAX) 50 MG tablet Take 50 mg by mouth daily.    No facility-administered encounter medications on file as of 08/28/2021.   AUNA MIKKELSEN was contacted to remind of upcoming telephone visit with Charlene Brooke on 09/02/2021 at 2:00. Patient was reminded to have all medications, supplements and any blood glucose and blood pressure  readings available for review at appointment. If unable to reach, a voicemail was left for patient.   Are you having any problems with your medications? No   Do you have any concerns you like to discuss with the pharmacist? No  Patient has eye surgery (cataract) scheduled with Dr. Tommy Rainwater on 09/09/2021 on left eye. Second surgery for right eye is 10/07/2021.  Star Rating Drugs: Medication:  Last Fill: Day Supply Simvastatin 20 mg 08/25/2020 90 Fill date verified with [facility]  Charlene Brooke, CPP notified  Marijean Niemann, [place] [phone]  Time Spent: 15 Minutes

## 2021-09-01 ENCOUNTER — Ambulatory Visit: Payer: Medicare Other | Admitting: Primary Care

## 2021-09-02 ENCOUNTER — Telehealth: Payer: Medicare Other

## 2021-09-02 ENCOUNTER — Telehealth: Payer: Self-pay | Admitting: Pharmacist

## 2021-09-02 NOTE — Progress Notes (Unsigned)
Chronic Care Management Pharmacy Note  09/02/2021 Name:  Katherine [name] MRN:  [id number] DOB:  June 19, 1951  Summary: -Pt would like to pursue iron infusion again -Pt started taking simvastatin 20 mg again (was previously off for several months). Added to med list. -2019 DEXA scan/FRAX score indicate patient was a candidate for pharmacologic treatment of osteopenia. She has repeat DEXA 06/2021 -Pt is taking Aleve daily for back pain  Recommendations/Changes made from today's visit: -Coordinate with PCP for iron infusion -Consider Prolia pending DEXA results 06/2021 -Advised to use Tylenol instead of Aleve on chronic basis  Plan: -[facility] will call patient in 1 month to ensure mammogram and DEXA scan occur -Pharmacist follow up televisit scheduled for in 2 months   Subjective: Katherine [name] is an 71 y.o. year old female who is a primary patient of [name], NP.  The CCM team was consulted for assistance with disease management and care coordination needs.    Engaged with patient by telephone for initial visit in response to provider referral for pharmacy case management and/or care coordination services.   Consent to Services:  The patient was given the following information about Chronic Care Management services today, agreed to services, and gave verbal consent: 1. CCM service includes personalized support from designated clinical staff supervised by the primary care provider, including individualized plan of care and coordination with other care providers 2. 24/7 contact phone numbers for assistance for urgent and routine care needs. 3. Service will only be billed when office clinical staff spend 20 minutes or more in a month to coordinate care. 4. Only one practitioner may furnish and bill the service in a calendar month. 5.The patient may stop CCM services at any time (effective at the end of the month) by phone call to the office staff. 6. The patient  will be responsible for cost sharing (co-pay) of up to 20% of the service fee (after annual deductible is met). Patient agreed to services and consent obtained.  Patient Care Team: [name], NP as PCP - General (Nurse Practitioner) Minna Merritts, MD as PCP - Cardiology (Cardiology) Minna Merritts, MD as Consulting Physician (Cardiology) Festus Aloe, MD as Consulting Physician (Urology) [name], NP as Nurse Practitioner Charlton Haws, [facility] as Pharmacist (Pharmacist)  Patient lives at home with her husband. 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   Recent office visits: 08/27/21 Alma Friendly NP OV: c/o dysuria. Urine culture and chlamydia/gonorrhea negative.  06/04/21-PCP-Katherine Clark,NP-Patient presented for dysuria and fatigue,labs ordered(Kidneys, electrolytes look good.Iron levels are low again.   We could do another iron infusion if she'd like. Rx'd bactrim for UTI.  03/10/21-PCP-Katherine Clark,NP-Patient presented for follow up recurrent UTI's,and Parkinson's disease.Labs ordered(UA negative) Vitamin D level low, blood sugars prediabetic range,referral for home health PT,  Recent consult visits: 08/12/21 Dr Lise Auer (Neurology - [facility]): f/u hallucinations, cognitive impairment. Advised to f/u with psych to change diazepam, possible topiramate d/t negative impact on cognition.  05/11/21-[facility]-Philip Lamptey,MD-Patient presented for dysuria.UA,xrays, Acute Cystitis-start Keflex 569m twice daily for 5 days,increase fluid intake. 02/27/21-[facility]-Kristin Boddu FNP-Patient presented for dysuria.UA ordered,start Cephalexin 5068m1 capsule twice daily for 7 days 02/22/21-[facility]-Kimberly Alves,NP-Patient presented for Covid-19 test  01/22/21-[facility]-Ian Lee,MD- f/u Lewy body dementia w/ delusions. continue Nuplazid 3420m capsule daily. D/W PCP, psychiatrist if antipsychotics (haloperidol,  olanzapine) would be an option for controlling severe delusions, would likely make Parkinsonism worse.  12/24/20-  [name]- therapy session- no data found 12/11/20-Urology- Festus Aloe- no data found  Hospital visits: None in previous 6 months   Objective:  Lab Results  Component Value Date   CREATININE 0.92 06/04/2021   BUN 15 06/04/2021   GFR 63.47 06/04/2021   GFRNONAA >60 11/11/2020   GFRAA 90 03/03/2018   NA 139 06/04/2021   K 4.0 06/04/2021   CALCIUM 9.1 06/04/2021   CO2 24 06/04/2021   GLUCOSE 74 06/04/2021    Lab Results  Component Value Date/Time   HGBA1C 6.2 03/10/2021 08:45 AM   HGBA1C 6.3 04/15/2020 12:41 PM   GFR 63.47 06/04/2021 03:48 PM   GFR 68.26 08/14/2020 12:43 PM    Last diabetic Eye exam: No results found for: HMDIABEYEEXA  Last diabetic Foot exam: No results found for: HMDIABFOOTEX   Lab Results  Component Value Date   CHOL 211 (H) 03/10/2021   HDL 57.40 03/10/2021   LDLCALC 60 04/15/2020   LDLDIRECT 143.0 03/10/2021   TRIG 266.0 (H) 03/10/2021   CHOLHDL 4 03/10/2021    Hepatic Function Latest Ref Rng & Units 11/11/2020 04/15/2020 07/09/2019  Total Protein 6.5 - 8.1 g/dL 7.1 6.6 7.0  Albumin 3.5 - 5.0 g/dL 4.2 3.9 4.1  AST 15 - 41 U/L _0 ALT 0 - 44 U/L _1 Alk Phosphatase 38 - 126 U/L 110 98 115  Total Bilirubin 0.3 - 1.2 mg/dL 0.6 0.5 0.3  Bilirubin, Direct 0.0 - 0.3 mg/dL - - -    Lab Results  Component Value Date/Time   TSH 3.33 10/07/2020 08:50 AM   TSH 1.93 03/13/2018 04:15 PM    CBC Latest Ref Rng & Units 06/04/2021 03/10/2021 11/11/2020  WBC 4.0 - 10.5 K/uL 10.1 9.6 11.5(H)  Hemoglobin 12.0 - 15.0 g/dL 11.8(L) 12.1 14.0  Hematocrit 36.0 - 46.0 % 38.2 38.4 44.8  Platelets 150.0 - 400.0 K/uL 350.0 324.0 326    Lab Results  Component Value Date/Time   VD25OH 21.57 (L) 03/10/2021 08:45 AM   VD25OH 38.63 04/15/2020 12:41 PM    Clinical ASCVD: No  The 10-year ASCVD risk score (Arnett DK, et al., 2019) is:  10.8%   Values used to calculate the score:     Age: 71 years     Sex: Female     Is Non-Hispanic African American: No     Diabetic: No     Tobacco smoker: No     Systolic Blood Pressure: 016 mmHg     Is BP treated: Yes     HDL Cholesterol: 57.4 mg/dL     Total Cholesterol: 211 mg/dL    Depression screen [facility] 2/9 02/24/2021 04/15/2020 07/09/2019  Decreased Interest 0 0 3  Down, Depressed, Hopeless _2 PHQ - 2 Score _3 Altered sleeping 0 0 3  Tired, decreased energy 3 3 0  Change in appetite 3 3 0  Feeling bad or failure about yourself  3 3 0  Trouble concentrating 0 0 0  Moving slowly or fidgety/restless 0 0 0  Suicidal thoughts - 0 0  PHQ-9 Score _4 Difficult doing work/chores - Not difficult at all Somewhat difficult  Some recent data might be hidden     Social History   Tobacco Use  Smoking Status Never  Smokeless Tobacco Never   BP Readings from Last 3 Encounters:  08/27/21 124/78  07/15/21 128/70  07/08/21 99/69   Pulse Readings from Last 3 Encounters:  08/27/21 (!) 112  07/08/21 90  06/25/21 92   Wt Readings from Last 3 Encounters:  08/27/21 178 lb (80.7 kg)  07/15/21 175 lb (79.4 kg)  07/08/21 179 lb 3.2 oz (81.3 kg)   BMI Readings from Last 3 Encounters:  08/27/21 31.53 kg/m  07/15/21 31.00 kg/m  07/08/21 33.86 kg/m    Assessment/Interventions: Review of patient past medical history, allergies, medications, health status, including review of consultants reports, laboratory and other test data, was performed as part of comprehensive evaluation and provision of chronic care management services.   SDOH:  (Social Determinants of Health) assessments and interventions performed: Yes  SDOH Screenings   Alcohol Screen: Not on file  Depression (PHQ2-9): Medium Risk   PHQ-2 Score: 10  Financial Resource Strain: Not on file  Food Insecurity: Not on file  Housing: Not on file  Physical Activity: Not on file  Social Connections: Not on file   Stress: Not on file  Tobacco Use: Low Risk    Smoking Tobacco Use: Never   Smokeless Tobacco Use: Never   Passive Exposure: Not on file  Transportation Needs: Not on file    CCM Care Plan  Allergies  Allergen Reactions   Chlorine Other (See Comments)    Smell causes lightheadedness.   Dilaudid [Hydromorphone]     hallucinations   Codeine Anxiety   Darvon Anxiety   Hydrocodone Anxiety   Meperidine Anxiety   Oxycodone Anxiety   Tramadol Anxiety   Victoza [Liraglutide] Nausea Only and Other (See Comments)    And weakness    Medications Reviewed Today     Reviewed by [name], NP (Nurse Practitioner) on 08/27/21 at 1611  Med List Status: <None>   Medication Order Taking? Sig Documenting Provider Last Dose Status Informant  acetaminophen (TYLENOL) 325 MG tablet [id number] Yes Take 650 mg by mouth every 6 (six) hours as needed. [provider] Taking Active   buPROPion (WELLBUTRIN XL) 150 MG 24 hr tablet [id number] Yes Take 1 tablet by mouth every morning. [provider] Taking Active   diazepam (VALIUM) 10 MG tablet [id number] Yes Take 10 mg by mouth at bedtime. [provider] Taking Active   DULoxetine (CYMBALTA) 30 MG capsule [id number] Yes Take 30 mg by mouth daily. (Total dose = 150 mg) [provider] Taking Active   DULoxetine (CYMBALTA) 60 MG capsule [id number] Yes Take 120 mg by mouth every morning. (Total dose = 150 mg) [provider] Taking Active   famotidine (PEPCID) 20 MG tablet [id number] Yes TAKE 1 TABLET (20 MG TOTAL) BY MOUTH DAILY. FOR HEARTBURN. [name], NP Taking Active   gabapentin (NEURONTIN) 300 MG capsule [id number] Yes TAKE 1 CAPSULE (300 MG TOTAL) BY MOUTH AT BEDTIME. FOR BACK PAIN.  Patient taking differently: Take 300 mg by mouth at bedtime. For back pain PRN   [name], NP Taking Active   Multiple Vitamin (MULTIVITAMIN) tablet [id number] Yes Take 1 tablet by mouth daily. [provider] Taking Active   NUPLAZID 34 MG CAPS [id number] Yes Take 1 capsule by mouth daily. [provider] Taking Active   OVER THE COUNTER MEDICATION [id number] Yes [facility] vitamin [provider] Taking Active   OVER THE [place] [id number] Yes 2 tablets daily. [employer], Historical, MD Taking Active   pantoprazole (PROTONIX) 40 MG tablet [id number] Yes Take 1 tablet (40 mg total) by mouth daily. For heartburn. [name], NP Taking Active   simvastatin (ZOCOR) 20  MG tablet [id number] Yes Take 20 mg by mouth daily. Minna Merritts, MD Taking Active Self  spironolactone (ALDACTONE) 25 MG tablet [id number] Yes Take 1 tablet (25 mg total) by mouth daily. Minna Merritts, MD Taking Active   SUMAtriptan (IMITREX) 100 MG tablet [id number] Yes Take 100 mg by mouth as needed. [provider] Taking Active   tamsulosin (FLOMAX) 0.4 MG CAPS capsule [id number] Yes Take 0.4 mg by mouth daily. [provider] Taking Active   topiramate (TOPAMAX) 50 MG tablet [id number] Yes Take 50 mg by mouth daily.  [provider] Taking Active             Patient Active Problem List   Diagnosis Date Noted   Vaginal itching 08/27/2021   Vaginal discharge 08/27/2021   Dysuria 03/10/2021   Vitamin B 12 deficiency 03/10/2021   Recurrent falls 03/10/2021   Hematuria 10/28/2020   Pelvic fullness in female 10/07/2020   Toe pain 08/14/2020   Lewy body dementia ([place]) 04/15/2020   Urinary incontinence 04/15/2020   Recurrent cystitis 02/13/2020   Cough 02/06/2020   Parkinson's disease ([place]) 07/11/2019   Leukocytosis 07/11/2019   Rash and nonspecific skin eruption 02/14/2019   Dizziness 12/14/2018   Osteopenia 07/07/2018   Gastroesophageal reflux disease 03/13/2018   Chronic migraine without aura without status migrainosus, not intractable 03/13/2018   Constipation 02/15/2017   Spondylolisthesis of lumbar region 07/15/2016   Vitamin D  deficiency 03/11/2016   Lumbar stenosis with neurogenic claudication 01/12/2016   Notalgia 01/08/2016   Restless leg syndrome 03/13/2015   Preventative health care 12/10/2014   Other fatigue 11/04/2014   Anemia 11/04/2014   Depression 10/31/2014   Prediabetes 10/31/2014   Obstructive sleep apnea 05/01/2009   Hyperlipidemia 09/04/2008   Obesity 09/04/2008   HYPERTENSION, BENIGN 09/04/2008    Immunization History  Administered Date(s) Administered   Fluad Quad(high Dose 65+) 05/16/2019, 04/15/2020   Influenza, High Dose Seasonal PF 04/21/2021   Influenza,inj,Quad PF,6+ Mos 05/09/2015, 05/07/2016, 03/30/2017, 05/10/2018   Influenza-Unspecified 05/01/2014   Moderna Covid-19 Vaccine Bivalent Booster 75yr & up 04/30/2021   Moderna Sars-Covid-2 Vaccination 08/29/2019, 09/26/2019   Pneumococcal Polysaccharide-23 12/10/2014, 07/06/2018, 04/30/2021   Td 12/10/2014   Tdap 02/08/2020   Zoster Recombinat (Shingrix) 02/27/2019, 06/12/2019   Zoster, Live 04/12/2009    Conditions to be addressed/monitored:  Hypertension, Hyperlipidemia, GERD, Depression, and Osteopenia, Dementia with behavioral disturbance  There are no care plans that you recently modified to display for this patient.     Medication Assistance:  Nuplazid obtained through medication assistance program.   Compliance/Adherence/Medication fill history: Care Gaps: Mammogram (due 06/01/20) - scheduled for Dec 2022  Star-Rating Drugs: Simvastatin - LF 11/19/20 x 90 ds, PDC 82%; pt was off of this for several months but restarted Oct 2022  Patient[id number] [id number]: [phone]: [phone] Uses pill box? Yes - husband fills it each week Pt endorses 100% compliance  We discussed: Current pharmacy is preferred with insurance plan and patient is satisfied with pharmacy services Patient decided to:  Continue current medication management strategy  Care Plan and Follow Up Patient Decision:  Patient agrees to Care Plan and Follow-up.  Plan: Telephone follow up appointment with care management team member scheduled for:  2 months  LiCharlene BrookePharmD, BCACP Clinical Pharmacist [phone]  Current Barriers:  Unable to independently monitor  therapeutic efficacy  Pharmacist Clinical Goal(s):  Patient will achieve adherence to monitoring guidelines and medication adherence to achieve therapeutic efficacy through collaboration with PharmD and provider.   Interventions: 1:1 collaboration with [name], NP regarding development and update of comprehensive plan of care as evidenced by provider attestation and co-signature Inter-disciplinary care team collaboration (see longitudinal plan of care) Comprehensive medication review performed; medication list updated in electronic medical record  Hypertension (BP goal <130/80) -Controlled - pt reports home BP is at goal; she checks regularly but has not been keeping a log -Current home readings: 120s/70s -Current treatment: Spironolactone 25 mg daily -Medications previously tried: carvedilol, chlorthalidone  -Denies hypotensive/hypertensive symptoms -Educated on BP goals and benefits of medications for prevention of heart attack, stroke and kidney damage; Importance of home blood pressure monitoring; -Counseled to monitor BP at home daily, document, and provide log at future appointments -Recommended to continue current medication  Hyperlipidemia: (LDL goal < 100) -Not ideally controlled - pt was previously off of simvastatin for several months, LDL had increased from 60 to 143; she reports she is now taking simvastatin again, she had plenty on hand so has not needed a refill yet -Current treatment: Simvastatin 20 mg daily -Medications previously tried: simvastatin  -Educated on  Cholesterol goals; Benefits of statin for ASCVD risk reduction; -Added simvastatin back to med list -Recommended to continue current medication  Depression/Anxiety (Goal: manage symptoms) -Relatively Controlled -pt reports duloxetine is the only drug that has ever helped her depression; pt reports family hx of depression (mother and father, brother); she reports 62 year hx of depression; she complains of dry mouth - Biotene, lozenges help some -trouble falling asleep, but sleeps well once she is asleep -PHQ9: 10 (02/2021) - moderate depression -GAD7: not on file -Connected with [name], NP for mental health support -Current treatment: Duloxetine 30 mg daily Duloxetine 60 mg - 2 tab daily Bupropion XL 150 mg daily AM Diazepam 10 mg HS -Medications previously tried/failed: aripiprazole, quetiapine, doxepin  -Educated on Benefits of medication for symptom control;  -Counseled dry mouth is likely due to high dose duloxetine; discussed benefits outweigh risks/side effects at this time -Recommended to continue current medication  Lewy Body dementia with behavioral disturbances (Goal: manage symptoms) -Relatively Controlled - pt reports Nuplazid has "done miracles" for her, but does think she is starting to build a tolerance -Per neurology - Nuplazid does not appear to be controlling delusions any longer, pt does not appear to have good insight; may consider atypical antipsychotics (haloperidol, olanzapine) although these would worsen parkinsonism -Current treatment  Nuplazid 34 mg daily HS (via PAP) -Medications previously tried: quetiapine, rivastigmine, levodopa  -Recommended to continue current medication   Osteopenia (Goal prevent fractures) -Not ideally controlled - pt is not taking calcium or vitamin D; she has DEXA scan scheduled next month -Last DEXA Scan: 06/01/18   T-Score femoral neck: -1.5  T-Score total hip: -1.4  T-Score lumbar spine: -0.2  10-year probability of major  osteoporotic fracture: 28.1%  10-year probability of hip fracture: 3.2% -Patient is a candidate for pharmacologic treatment due to T-Score -1.0 to -2.5 and 10-year risk of major osteoporotic fracture > 20% and T-Score -1.0 to -2.5 and 10-year risk of hip fracture > 3% -Current treatment  None -Medications previously tried: alendronate (GI)  -Recommend [id number] units of vitamin D daily. Recommend 1200 mg of calcium daily from dietary and supplemental sources.  -Consider pharmacologic treatment based on DEXA results next month  Migraines (Goal: reduce migraine  frequency) -Controlled - pt reports rare migraines since starting topiramate, she used to have them once a week -Current treatment  Topiramate 50 mg daily Sumatriptan 100 mg PRN - rare use -Recommended to continue current medication  Back pain (Goal: manage symptoms) -Controlled - pt reports overall back pain is improving; she using gabapentin PRN and Aleve daily; she is worried about Tylenol and liver issues because her father died from non-alcoholic liver disease -Hx 2 back surgeries 2018 -Current treatment  Gabapentin 300 mg HS - PRN Tylenol 325 mg PRN Aleve OTC Duloxetine 150 mg/day -Counseled on risks of chronic NSAIDs (GI bleeding, hypertension, kidney damage);  -Educated on NALD; Tylenol liver risks; max daily dose of 3000 mg/day;  -Advised to use Tylenol daily and Aleve infrequently  Urinary voiding difficulty (Goal: manage symptoms) -Not ideally controlled - pt reports tamsulosin has helped with voiding difficulties but it is still a major issue; she is starting pelvic floor PT in December and hopeful that this will help -Current treatment  Tamsulosin 0.4 mg daily -Recommended to continue current medication  Iron deficiency anemia (Goal: manage symptoms) -Uncontrolled - pt reports she is not taking iron supplements, she had significant constipation with them int he past and she would like another iron  infusion -Current treatment  None -Will coordinate with PCP to set up iron infusion  GERD (Goal: manage symptoms) -Controlled - pt reports dairy products trigger GERD; she takes PPI daily and has occasional breakthrough issue for which she takes famotidine -Current treatment  Pantoprazole 40 mg daily Famotidine 20 mg PRN -Counseled on long term PPI risks; pt is not interested in stopping PPI -Recommended to continue current medication  Health Maintenance -Vaccine gaps: Prevnar, Covid booster, Flu -Pt reports [facility] the [facility]. Will get  -Current therapy:  Multivitamin [employer] vitamin (hair/skin/nails) Fiber supplement -Patient is satisfied with current therapy and denies issues -Recommended to continue current medication  Patient Goals/Self-Care Activities Patient will:  - take medications as prescribed as evidenced by patient report and record review focus on medication adherence by pill box check blood pressure daily, document, and provide at future appointments -Stop daily Aleve, take Tylenol instead (up to 3000 mg/day)

## 2021-09-02 NOTE — Telephone Encounter (Signed)
°  Chronic Care Management   Outreach Note  09/02/2021 Name: Katherine [name] MRN: [id number] DOB: 10-Mar-1951  Referred by: [name], NP  Patient had a phone appointment scheduled with clinical pharmacist today.  An unsuccessful telephone outreach was attempted today. The patient was referred to the pharmacist for assistance with medications, care management and care coordination.   Patient will NOT be penalized in any way for missing a CCM appointment. The no-show fee does not apply.  If possible, a message was left to return call to: [phone] or to [facility].  Charlene Brooke, PharmD, BCACP Clinical Pharmacist [facility] [phone]

## 2021-09-09 DIAGNOSIS — H2512 Age-related nuclear cataract, left eye: Secondary | ICD-10-CM | POA: Diagnosis not present

## 2021-09-09 DIAGNOSIS — H25012 Cortical age-related cataract, left eye: Secondary | ICD-10-CM | POA: Diagnosis not present

## 2021-09-10 DIAGNOSIS — H2511 Age-related nuclear cataract, right eye: Secondary | ICD-10-CM | POA: Diagnosis not present

## 2021-09-11 DIAGNOSIS — F3132 Bipolar disorder, current episode depressed, moderate: Secondary | ICD-10-CM | POA: Diagnosis not present

## 2021-09-14 NOTE — Progress Notes (Signed)
Patient has been rescheduled to 12/22/21 at 3:00 pm due to missed 09/02/21 appointment.   Katherine [name], CPP notified  Margaretmary Dys, [facility] Assistant [phone]

## 2021-09-17 DIAGNOSIS — R3911 Hesitancy of micturition: Secondary | ICD-10-CM | POA: Diagnosis not present

## 2021-09-17 DIAGNOSIS — R3914 Feeling of incomplete bladder emptying: Secondary | ICD-10-CM | POA: Diagnosis not present

## 2021-09-17 DIAGNOSIS — N3941 Urge incontinence: Secondary | ICD-10-CM | POA: Diagnosis not present

## 2021-09-25 ENCOUNTER — Other Ambulatory Visit: Payer: Self-pay

## 2021-09-25 ENCOUNTER — Ambulatory Visit: Payer: Medicare Other | Admitting: Primary Care

## 2021-09-28 ENCOUNTER — Telehealth: Payer: Self-pay

## 2021-09-28 NOTE — Progress Notes (Unsigned)
° ° °  Chronic Care Management Pharmacy Assistant   Name: KAYDENCE MENARD  MRN: [id number] DOB: March 10, 1951  Reason for Encounter: CCM (Reschedule CCM Appointment)   Called patient to reschedule her appointment with Charlene Brooke from June to April. Appointment was 06/06 @ 3:00. I have tentatively scheduled patient for 04/06 @ 3:00 telephone visit.  Charlene Brooke, CPP notified  Marijean Niemann, [place] Clinical Pharmacy Assistant [phone]  LM 03/13

## 2021-09-30 ENCOUNTER — Other Ambulatory Visit: Payer: Self-pay

## 2021-09-30 ENCOUNTER — Encounter: Payer: Self-pay | Admitting: Family Medicine

## 2021-09-30 ENCOUNTER — Ambulatory Visit (INDEPENDENT_AMBULATORY_CARE_PROVIDER_SITE_OTHER): Payer: Medicare Other | Admitting: Family Medicine

## 2021-09-30 VITALS — BP 124/80 | HR 110 | Temp 97.8°F | Ht 63.0 in | Wt 170.0 lb

## 2021-09-30 DIAGNOSIS — R300 Dysuria: Secondary | ICD-10-CM | POA: Diagnosis not present

## 2021-09-30 DIAGNOSIS — G3183 Dementia with Lewy bodies: Secondary | ICD-10-CM

## 2021-09-30 DIAGNOSIS — F0280 Dementia in other diseases classified elsewhere without behavioral disturbance: Secondary | ICD-10-CM | POA: Diagnosis not present

## 2021-09-30 LAB — POC URINALSYSI DIPSTICK (AUTOMATED)
Blood, UA: NEGATIVE
Glucose, UA: NEGATIVE
Ketones, UA: NEGATIVE
Nitrite, UA: POSITIVE
Protein, UA: POSITIVE — AB
Spec Grav, UA: >=1.03 — AB (ref 1.010–1.025)
Urobilinogen, UA: 1 E.U./dL
pH, UA: 5.5 (ref 5.0–8.0)

## 2021-09-30 MED ORDER — CEPHALEXIN 500 MG PO CAPS: 500.0000 mg | ORAL_CAPSULE | Freq: Two times a day (BID) | ORAL | 0 refills | Status: DC

## 2021-09-30 NOTE — Assessment & Plan Note (Signed)
Leukocytes on urinalysis ?Urine culture pending ?Encouraged increased water intake ?Prescribed Keflex for 7-day course to start now while waiting for the culture result ?Instructed patient to call if symptoms worsen before then ?

## 2021-09-30 NOTE — Progress Notes (Signed)
Subjective:    Patient ID: Katherine [name], female    DOB: [birth year], 71 y.o.   MRN: [id number]  This visit occurred during the SARS-CoV-2 public health emergency.  Safety protocols were in place, including screening questions prior to the visit, additional usage of staff PPE, and extensive cleaning of exam room while observing appropriate contact time as indicated for disinfecting solutions.   HPI 71 year old patient of NP Chestine Spore presents with urinary symptoms He has a history of recurrent cystitis in the past  Wt Readings from Last 3 Encounters:  09/30/21 170 lb (77.1 kg)  08/27/21 178 lb (80.7 kg)  07/15/21 175 lb (79.4 kg)   30.11 kg/m  Urinary discomfort  Uncomfortable- burning feeling  Feels wet when she wipes   Has had a lot of utis recently  No blood in urine  Feels the urge to urinate frequently and sometime urinate   No flank pain but some in low back No n/v No fever  Some malaise/aches  Results for orders placed or performed in visit on 09/30/21  POCT Urinalysis Dipstick (Automated)  Result Value Ref Range   Color, UA amber    Clarity, UA cloudy    Glucose, UA Negative Negative   Bilirubin, UA 1+    Ketones, UA neg    Spec Grav, UA >=1.030 (A) 1.010 - 1.025   Blood, UA neg    pH, UA 5.5 5.0 - 8.0   Protein, UA Positive (A) Negative   Urobilinogen, UA 1.0 0.2 or 1.0 E.U./dL   Nitrite, UA positive    Leukocytes, UA Small (1+) (A) Negative     Recent ? Of lewy body dementia vs frontotemporal  Says she was eval at [place]  States the following  Thinks her husband lets a woman in her room at night and he cheats  Thinks hermit crabs were in her hair  Thinks she has eggs in her scalp and wants that checked  Thinks that husband is cheating on her then giving her the uti from another woman    Has appt with [facility] next week for further evaluation of urinary retention    Patient Active Problem List   Diagnosis Date Noted   Vaginal itching  08/27/2021   Vaginal discharge 08/27/2021   Dysuria 03/10/2021   Vitamin B 12 deficiency 03/10/2021   Recurrent falls 03/10/2021   Hematuria 10/28/2020   Pelvic fullness in female 10/07/2020   Toe pain 08/14/2020   Lewy body dementia (HCC) 04/15/2020   Urinary incontinence 04/15/2020   Recurrent cystitis 02/13/2020   Cough 02/06/2020   Parkinson's disease (HCC) 07/11/2019   Leukocytosis 07/11/2019   Rash and nonspecific skin eruption 02/14/2019   Dizziness 12/14/2018   Osteopenia 07/07/2018   Gastroesophageal reflux disease 03/13/2018   Chronic migraine without aura without status migrainosus, not intractable 03/13/2018   Constipation 02/15/2017   Spondylolisthesis of lumbar region 07/15/2016   Vitamin D deficiency 03/11/2016   Lumbar stenosis with neurogenic claudication 01/12/2016   Notalgia 01/08/2016   Restless leg syndrome 03/13/2015   Preventative health care 12/10/2014   Other fatigue 11/04/2014   Anemia 11/04/2014   Depression 10/31/2014   Prediabetes 10/31/2014   Obstructive sleep apnea 05/01/2009   Hyperlipidemia 09/04/2008   Obesity 09/04/2008   HYPERTENSION, BENIGN 09/04/2008   Past Medical History:  Diagnosis Date   Anemia 2018   Anxiety    Arthritis    Borderline diabetes    Chest pain    a. Normal coronaries by cath  2007; Anomalous RCA arising from LAD diagonal (versus total native RCA with collateral)   Chicken pox    age 54   Chronic back pain    Compressed discs. [facility]   Depression    Dyspnea on exertion    a. 03/2009 Echo: EF 65%, no rwma, triv TR.   GERD (gastroesophageal reflux disease)    High cholesterol    Hypertension    Hypokalemia    Migraine headache    Obesity    Parkinson's disease (HCC)    Pre-diabetes    okay now    Past Surgical History:  Procedure Laterality Date   BLADDER SUSPENSION  2005   CARDIAC CATHETERIZATION     CESAREAN SECTION     CLOSED REDUCTION NASAL FRACTURE N/A 01/11/2020    Procedure: CLOSED REDUCTION NASAL FRACTURE;  Surgeon: Linus Salmons, MD;  Location: [facility];  Service: ENT;  Laterality: N/A;   ENDOMETRIAL ABLATION  2007   FOOT SURGERY     bilateral bunions   LUMBAR LAMINECTOMY/DECOMPRESSION MICRODISCECTOMY Bilateral 01/12/2016   Procedure: Bilateral L4-5 Laminotomy/Foraminotomy;  Surgeon: Tressie Stalker, MD;  Location: [facility];  Service: Neurosurgery;  Laterality: Bilateral;  Bilateral L4-5 Laminotomy/Foraminotomy   TONSILLECTOMY  1971   Social History   Tobacco Use   Smoking status: Never   Smokeless tobacco: Never  Vaping Use   Vaping Use: Never used  Substance Use Topics   Alcohol use: No   Drug use: No   Family History  Problem Relation Age of Onset   Heart disease Mother    Hypertension Mother    Heart failure Mother        CHF   Diabetes Mother    Cancer Mother        CERVICAL CANCER   Heart disease Father    Diabetes Father    Liver disease Father    Hypertension Father    Diabetes Sister    Breast cancer Sister 48   Heart disease Brother    Heart failure Brother        CHF   Diabetes Brother    Cancer Brother 74       THROAT AND NECK   Esophageal cancer Brother    Cancer Maternal Grandfather    Colon cancer Neg Hx    Stomach cancer Neg Hx    Rectal cancer Neg Hx    Allergies  Allergen Reactions   Chlorine Other (See Comments)    Smell causes lightheadedness.   Dilaudid [Hydromorphone]     hallucinations   Codeine Anxiety   Darvon Anxiety   Hydrocodone Anxiety   Meperidine Anxiety   Oxycodone Anxiety   Tramadol Anxiety   Victoza [Liraglutide] Nausea Only and Other (See Comments)    And weakness   Current Outpatient Medications on File Prior to Visit  Medication Sig Dispense Refill   acetaminophen (TYLENOL) 325 MG tablet Take 650 mg by mouth every 6 (six) hours as needed.     buPROPion (WELLBUTRIN XL) 150 MG 24 hr tablet Take 1 tablet by mouth every morning.     diazepam (VALIUM) 10 MG  tablet Take 10 mg by mouth at bedtime.  2   DULoxetine (CYMBALTA) 30 MG capsule Take 30 mg by mouth daily. (Total dose = 150 mg)     DULoxetine (CYMBALTA) 60 MG capsule Take 120 mg by mouth every morning. (Total dose = 150 mg)     famotidine (PEPCID) 20 MG tablet TAKE 1 TABLET (20  MG TOTAL) BY MOUTH DAILY. FOR HEARTBURN. 90 tablet 0   gabapentin (NEURONTIN) 300 MG capsule TAKE 1 CAPSULE (300 MG TOTAL) BY MOUTH AT BEDTIME. FOR BACK PAIN. (Patient taking differently: Take 300 mg by mouth at bedtime. For back pain PRN) 90 capsule 2   Multiple Vitamin (MULTIVITAMIN) tablet Take 1 tablet by mouth daily.     NUPLAZID 34 MG CAPS Take 1 capsule by mouth daily.     OVER THE COUNTER MEDICATION [employer] vitamin     OVER THE COUNTER MEDICATION 2 tablets daily. Air Shield     pantoprazole (PROTONIX) 40 MG tablet Take 1 tablet (40 mg total) by mouth daily. For heartburn. 90 tablet 3   simvastatin (ZOCOR) 20 MG tablet Take 20 mg by mouth daily.     spironolactone (ALDACTONE) 25 MG tablet Take 1 tablet (25 mg total) by mouth daily. 90 tablet 3   tamsulosin (FLOMAX) 0.4 MG CAPS capsule Take 0.4 mg by mouth daily.     topiramate (TOPAMAX) 50 MG tablet Take 50 mg by mouth daily.      SUMAtriptan (IMITREX) 100 MG tablet Take 100 mg by mouth as needed.     No current facility-administered medications on file prior to visit.     Review of Systems  Constitutional:  Negative for activity change, appetite change, fatigue, fever and unexpected weight change.  HENT:  Negative for congestion, ear pain, rhinorrhea, sinus pressure and sore throat.   Eyes:  Negative for pain, redness and visual disturbance.  Respiratory:  Negative for cough, shortness of breath and wheezing.   Cardiovascular:  Negative for chest pain and palpitations.  Gastrointestinal:  Negative for abdominal pain, blood in stool, constipation and diarrhea.  Endocrine: Negative for polydipsia and polyuria.  Genitourinary:  Positive for difficulty  urinating, dysuria, frequency, urgency and vaginal discharge. Negative for genital sores.  Musculoskeletal:  Negative for arthralgias, back pain and myalgias.  Skin:  Negative for pallor and rash.  Allergic/Immunologic: Negative for environmental allergies.  Neurological:  Negative for dizziness, syncope and headaches.  Hematological:  Negative for adenopathy. Does not bruise/bleed easily.  Psychiatric/Behavioral:  Negative for decreased concentration and dysphoric mood. The patient is nervous/anxious.       Objective:   Physical Exam Constitutional:      General: She is not in acute distress.    Appearance: Normal appearance. She is well-developed. She is obese. She is not ill-appearing or diaphoretic.  HENT:     Head: Normocephalic and atraumatic.  Eyes:     Conjunctiva/sclera: Conjunctivae normal.     Pupils: Pupils are equal, round, and reactive to light.  Cardiovascular:     Rate and Rhythm: Regular rhythm. Tachycardia present.     Heart sounds: Normal heart sounds.  Pulmonary:     Effort: Pulmonary effort is normal.     Breath sounds: Normal breath sounds.  Abdominal:     General: Bowel sounds are normal. There is no distension.     Palpations: Abdomen is soft.     Tenderness: There is abdominal tenderness. There is no rebound.     Comments: No cva tenderness  Mild suprapubic tenderness  Musculoskeletal:     Cervical back: Normal range of motion and neck supple.  Lymphadenopathy:     Cervical: No cervical adenopathy.  Skin:    Findings: No rash.     Comments: No acute scalp or skin findings  Neurological:     Mental Status: She is alert.  Psychiatric:  Mood and Affect: Mood is anxious.        Thought Content: Thought content is delusional.          Assessment & Plan:   Problem List Items Addressed This Visit       Other   Dysuria - Primary    Leukocytes on urinalysis Urine culture pending Encouraged increased water intake Prescribed Keflex for  7-day course to start now while waiting for the culture result Instructed patient to call if symptoms worsen before then      Relevant Orders   POCT Urinalysis Dipstick (Automated) (Completed)   Urine Culture

## 2021-09-30 NOTE — Assessment & Plan Note (Signed)
Pt spoke today again re: belief husband is cheating and the woman is tormenting her  ? ?

## 2021-09-30 NOTE — Patient Instructions (Signed)
Drink lots of water  ?Take care of yourself  ? ?Take the generic keflex for the uti  ?We will call you with the result of the culture when it returns  ? ?Please alert [place] if symptoms worsen or change in the meantime  ? ? ? ?

## 2021-10-01 DIAGNOSIS — R3911 Hesitancy of micturition: Secondary | ICD-10-CM | POA: Diagnosis not present

## 2021-10-01 DIAGNOSIS — R3914 Feeling of incomplete bladder emptying: Secondary | ICD-10-CM | POA: Diagnosis not present

## 2021-10-01 DIAGNOSIS — N3941 Urge incontinence: Secondary | ICD-10-CM | POA: Diagnosis not present

## 2021-10-02 ENCOUNTER — Telehealth: Payer: Self-pay

## 2021-10-02 LAB — URINE CULTURE
MICRO NUMBER:: 13134427
SPECIMEN QUALITY:: ADEQUATE

## 2021-10-02 NOTE — Telephone Encounter (Signed)
[place] office for lab.  ?

## 2021-10-04 ENCOUNTER — Encounter: Payer: Self-pay | Admitting: Family Medicine

## 2021-10-05 ENCOUNTER — Telehealth: Payer: Self-pay

## 2021-10-05 NOTE — Progress Notes (Deleted)
? ?Cardiology Office Note   ? ?Date:  10/05/2021  ? ?ID:  Katherine [name], DOB [birth year], MRN [id number] ? ?PCP:  Katherine Koch, NP  ?Cardiologist:  Katherine Rogue, MD  ?Electrophysiologist:  None  ? ?Chief Complaint: Follow-up ? ?History of Present Illness:  ? ?Katherine [name] is a 71 y.o. female with history of HTN, Parkinsonism, probable Lewy body dementia, visual hallucination/delusions, COVID in 02/2021, HTN, HLD, iron deficiency anemia, hypokalemia, and chronic back pain who presents for ***. ? ?Prior LHC in 2007 showed normal coronary artery.  Echo in 2010 demonstrated an EF of 65%, normal wall motion, and no significant valvular abnormalities.  Most recent ischemic evaluation via [facility] in 08/2017 which showed no evidence of ischemia or scar and was overall low risk.  Echo in 09/2017 demonstrated an EF of 55 to 60%, no regional wall motion abnormalities, grade 1 diastolic dysfunction, normal RV systolic function and PASP.  Most recent echo from 06/2020 demonstrated an EF of 60 to 65%, no regional wall motion abnormalities, grade 1 diastolic dysfunction, normal RV systolic function, and normal PASP.  She was last seen in the office on 07/22/2020 and was doing well from a cardiac perspective.  She reported improvement in her Parkinson's symptoms.  Due to dizziness and fatigue, spironolactone was decreased to 25 mg daily from twice daily dose with recommendation to possibly decrease carvedilol if symptoms persisted. ? ?*** ? ? ?Labs independently reviewed: ?07/2021 - Hgb 14.7, PLT 327, TSH normal ?05/2021 - potassium 4.0, BUN 15, serum creatinine 0.92 ?02/2021 - direct LDL 143, TC 211, TG 266, HDL 57, A1c 6.2 ?10/2020 - albumin 4.2, AST/ALT normal ? ?Past Medical History:  ?Diagnosis Date  ? Anemia 2018  ? Anxiety   ? Arthritis   ? Borderline diabetes   ? Chest pain   ? a. 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 Chicken pox   ?  age 13  ? Chronic back pain   ? Compressed discs. [place]  ? Depression   ? Dyspnea on exertion   ? a. 03/2009 Echo: EF 65%, no rwma, triv TR.  ? GERD (gastroesophageal reflux disease)   ? High cholesterol   ? Hypertension   ? Hypokalemia   ? Migraine headache   ? Obesity   ? Parkinson's disease ([place])   ? Pre-diabetes   ? okay now   ? ? ?Past Surgical History:  ?Procedure Laterality Date  ? BLADDER SUSPENSION  2005  ? CARDIAC CATHETERIZATION    ? CESAREAN SECTION    ? CLOSED REDUCTION NASAL FRACTURE N/A 01/11/2020  ? Procedure: CLOSED REDUCTION NASAL FRACTURE;  Surgeon: Katherine Gust, MD;  Location: [facility];  Service: ENT;  Laterality: N/A;  ? ENDOMETRIAL ABLATION  2007  ? FOOT SURGERY    ? bilateral bunions  ? LUMBAR LAMINECTOMY/DECOMPRESSION MICRODISCECTOMY Bilateral 01/12/2016  ? Procedure: Bilateral L4-5 Laminotomy/Foraminotomy;  Surgeon: Katherine Pies, MD;  Location: [facility];  Service: Neurosurgery;  Laterality: Bilateral;  Bilateral L4-5 Laminotomy/Foraminotomy  ? TONSILLECTOMY  1971  ? ? ?Current Medications: ?No outpatient medications have been marked as taking for the 10/07/21 encounter (Appointment) with Katherine Mu, PA-C.  ? ? ?Allergies:   Chlorine, Dilaudid [hydromorphone], Codeine, Darvon, Hydrocodone, Meperidine, Oxycodone, Tramadol, and Victoza [liraglutide]  ? ?Social History  ? ?Socioeconomic History  ? Marital status: Married  ?  Spouse name: Not on file  ?  Number of children: Not on file  ? Years of education: Not on file  ? Highest education level: Not on file  ?Occupational History  ? Occupation: Pharmacist, hospital  ?  Employer: [employer]  ?Tobacco Use  ? Smoking status: Never  ? Smokeless tobacco: Never  ?Vaping Use  ? Vaping Use: Never used  ?Substance and Sexual Activity  ? Alcohol use: No  ? Drug use: No  ? Sexual activity: Yes  ?  Partners: Male  ?  Birth control/protection: Post-menopausal  ?Other Topics Concern  ? Not on file  ?Social  History Narrative  ? Married.  ? One son is 36, lives in [place].  ? 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  ? Enjoys substitute teaching, gardening, cooking.  ? ?Social Determinants of Health  ? ?Financial Resource Strain: Not on file  ?Food Insecurity: Not on file  ?Transportation Needs: Not on file  ?Physical Activity: Not on file  ?Stress: Not on file  ?Social Connections: Not on file  ?  ? ?Family History:  ?The patient's family history includes Breast cancer (age of onset: 18) in her sister; Cancer in her maternal grandfather and mother; Cancer (age of onset: 72) in her brother; Diabetes in her brother, father, mother, and sister; Esophageal cancer in her brother; Heart disease in her brother, father, and mother; Heart failure in her brother and mother; Hypertension in her father and mother; Liver disease in her father. There is no history of Colon cancer, Stomach cancer, or Rectal cancer. ? ?ROS:   ?ROS ? ? ?EKGs/Labs/Other Studies Reviewed:   ? ?Studies reviewed were summarized above. The additional studies were reviewed today: ? ?2D echo 07/03/2020: ?1. Left ventricular ejection fraction, by estimation, is 60 to 65%. The  ?left ventricle has normal function. The left ventricle has no regional  ?wall motion abnormalities. Left ventricular diastolic parameters are  ?consistent with Grade I diastolic  ?dysfunction (impaired relaxation).  ? 2. Right ventricular systolic function is normal. The right ventricular  ?size is normal. There is normal pulmonary artery systolic pressure. The  ?estimated right ventricular systolic pressure is 35.3 mmHg. ?__________ ? ?2D echo 10/14/2017: ?- Left ventricle: The cavity size was normal. Systolic function was  ?  normal. The estimated ejection fraction was in the range of 55%  ?  to 60%. Wall motion was normal; there were no regional wall  ?  motion abnormalities. Doppler parameters are consistent with  ?  abnormal left ventricular relaxation (grade 1 diastolic  ?  dysfunction).  ?- Left  atrium: The atrium was normal in size.  ?- Right ventricle: Systolic function was normal.  ?- Pulmonary arteries: Systolic pressure was within the normal  ?  range. ?__________ ? ?[facility] MPI 08/24/2017: ?Normal pharmacologic myocardial perfusion stress test without ischemia or scar. ?Small, hyperdynamic left ventricle with normal wall motion. LVEF >65%. ?This is a low risk study. ? ? ? ?EKG:  EKG is ordered today.  The EKG ordered today demonstrates *** ? ?Recent Labs: ?10/07/2020: TSH 3.33 ?11/11/2020: ALT 12 ?06/04/2021: BUN 15; Creatinine, Ser 0.92; Hemoglobin 11.8; Platelets 350.0; Potassium 4.0; Sodium 139  ?Recent Lipid Panel ?   ?Component Value Date/Time  ? CHOL 211 (H) 03/10/2021 0845  ? CHOL 123 06/22/2013 0809  ? TRIG 266.0 (H) 03/10/2021 0845  ? HDL 57.40 03/10/2021 0845  ? HDL 48 06/22/2013 0809  ? CHOLHDL 4 03/10/2021 0845  ? VLDL 53.2 (H) 03/10/2021 0845  ? [place] 60 04/15/2020 1241  ? [place] 60 06/22/2013 0809  ?  LDLDIRECT 143.0 03/10/2021 0845  ? ? ?PHYSICAL EXAM:   ? ?VS:  There were no vitals taken for this visit.  BMI: There is no height or weight on file to calculate BMI. ? ?Physical Exam ? ?Wt Readings from Last 3 Encounters:  ?09/30/21 170 lb (77.1 kg)  ?08/27/21 178 lb (80.7 kg)  ?07/15/21 175 lb (79.4 kg)  ?  ? ?ASSESSMENT & PLAN:  ? ?Tachycardia: ? ?Dizziness: ? ?HTN: Blood pressure ? ?HLD: LDL 143 in 02/2021. ? ?Parkinsonism/visual hallucinations/cognitive impairment/probable Lewy body dementia: Followed by neurology at [facility]. ? ? ?{Are you ordering a CV Procedure (e.g. stress test, cath, DCCV, TEE, etc)?   Press F2        :[id number]}  ? ? ? ?Disposition: F/u with Dr. Rockey Situ or an APP in ***. ? ? ?Medication Adjustments/Labs and Tests Ordered: ?Current medicines are reviewed at length with the patient today.  Concerns regarding medicines are outlined above. Medication changes, Labs and Tests ordered today are summarized above and listed in the Patient Instructions accessible in  Encounters.  ? ?Signed, ?Christell Faith, PA-C ?10/05/2021 8:34 AM    ? ?[facility] ?[place], [place] [address] ?[phone] ?

## 2021-10-05 NOTE — Telephone Encounter (Signed)
Called and lvm for patient to call us back, pt needs to have an follow up with her pcp Carlis Abbott  ?

## 2021-10-05 NOTE — Telephone Encounter (Signed)
[facility] - Client ?Nonclinical Telephone Record  ?[facility]? ?Client [facility] - Client ?Client Site [place] ?Provider [place], Roque Lias - MD ?Contact Type Call ?Who Is Calling Patient / Member / Family / Caregiver ?Caller Name Rylei Masella ?Caller Phone Number [phone] ?Call Type Message Only Information Provided ?Reason for Call Returning a Call from the Office ?Initial Comment Caller states that she is returning a call from the office. ?Additional Comment Office hours provided. ?Disp. Time Disposition Final User ?10/02/2021 6:42:04 PM General Information Provided Yes Lynne Logan, Amy ?Call Closed By: Sherlynn Stalls ?Transaction Date/Time: 10/02/2021 6:39:56 PM (ET ?

## 2021-10-05 NOTE — Telephone Encounter (Signed)
Pt called back and scheduled f/u with PCP ?

## 2021-10-05 NOTE — Telephone Encounter (Signed)
Pt called back and has a f/u scheduled with PCP this Friday  ?

## 2021-10-05 NOTE — Telephone Encounter (Signed)
Called and lvm for patient to call us back regarding my chart message,  need to set up an appt with her pcp. ?

## 2021-10-05 NOTE — Telephone Encounter (Signed)
Called and lvm for patient call us back. 

## 2021-10-07 ENCOUNTER — Ambulatory Visit: Payer: Medicare Other | Admitting: Physician Assistant

## 2021-10-09 ENCOUNTER — Ambulatory Visit: Payer: Medicare Other | Admitting: Primary Care

## 2021-10-13 ENCOUNTER — Ambulatory Visit: Payer: Medicare Other | Admitting: Primary Care

## 2021-10-13 ENCOUNTER — Encounter: Payer: Self-pay | Admitting: Family Medicine

## 2021-10-13 ENCOUNTER — Telehealth: Payer: Medicare Other | Admitting: Family Medicine

## 2021-10-13 DIAGNOSIS — N898 Other specified noninflammatory disorders of vagina: Secondary | ICD-10-CM

## 2021-10-13 DIAGNOSIS — Z87440 Personal history of urinary (tract) infections: Secondary | ICD-10-CM

## 2021-10-13 NOTE — Telephone Encounter (Signed)
Patient is on the schedule to see [place] tomorrow.  ?

## 2021-10-13 NOTE — Patient Instructions (Signed)
Please call your PCP office ?

## 2021-10-13 NOTE — Progress Notes (Signed)
[place] ? ?PCP office had reached out to have her seen in person.Marland Kitchen ?She was not aware of this. ? ?Patient acknowledged agreement and understanding of the plan.  ? ?

## 2021-10-14 ENCOUNTER — Encounter: Payer: Self-pay | Admitting: Family

## 2021-10-14 ENCOUNTER — Other Ambulatory Visit: Payer: Self-pay

## 2021-10-14 ENCOUNTER — Ambulatory Visit (INDEPENDENT_AMBULATORY_CARE_PROVIDER_SITE_OTHER): Payer: Medicare Other | Admitting: Family

## 2021-10-14 VITALS — BP 122/70 | HR 88 | Temp 97.4°F | Resp 16 | Ht 63.0 in | Wt 185.1 lb

## 2021-10-14 DIAGNOSIS — N3000 Acute cystitis without hematuria: Secondary | ICD-10-CM

## 2021-10-14 DIAGNOSIS — R21 Rash and other nonspecific skin eruption: Secondary | ICD-10-CM

## 2021-10-14 DIAGNOSIS — N3090 Cystitis, unspecified without hematuria: Secondary | ICD-10-CM

## 2021-10-14 DIAGNOSIS — R300 Dysuria: Secondary | ICD-10-CM

## 2021-10-14 LAB — POC URINALSYSI DIPSTICK (AUTOMATED)
Bilirubin, UA: NEGATIVE
Blood, UA: NEGATIVE
Glucose, UA: NEGATIVE
Ketones, UA: NEGATIVE
Leukocytes, UA: NEGATIVE
Nitrite, UA: NEGATIVE
Protein, UA: NEGATIVE
Spec Grav, UA: 1.01 (ref 1.010–1.025)
Urobilinogen, UA: 0.2 E.U./dL
pH, UA: 5.5 (ref 5.0–8.0)

## 2021-10-14 MED ORDER — TOPIRAMATE 50 MG PO TABS: 50.0000 mg | ORAL_TABLET | Freq: Every day | ORAL | Status: DC

## 2021-10-14 NOTE — Assessment & Plan Note (Signed)
poct dipstick as well as urine culture ordered pending results to choose to treat ?Suspected uti however pending results to see if antbx is needed as just completed antbx a few weeks ago ?

## 2021-10-14 NOTE — Patient Instructions (Signed)
It was a pleasure seeing you today.  ? ?It is suspected that you have a urinary tract infection. ? ?We are sending your urine for a culture to make sure you do not have a resistant bacteria. We will call you if we need to start an antibiotic.  ? ?Please make sure you are drinking plenty of fluids over the next few days. ? ?If your symptoms do not improve over the next 5-7 days, or if they worsen, please let us know. Please also let us know if you have worsening back pain, fevers, chills, or body aches.  ? ?Regards,  ? ?Katherine [name] ? ?

## 2021-10-14 NOTE — Assessment & Plan Note (Signed)
Suspected rosacea as pt responsive to metronidazole gel.  ?Informed pt of triggers ?Handout given to pt on rosacea.  ? ?

## 2021-10-14 NOTE — Assessment & Plan Note (Signed)
Pt to f/u with her urologist as scheduled. ?

## 2021-10-14 NOTE — Progress Notes (Signed)
? ?Established Patient Office Visit ? ?Subjective:  ?Patient ID: Katherine [name], female    DOB: 03/15/1951  Age: 71 y.o. MRN: [id number] ? ?CC:  ?Chief Complaint  ?Patient presents with  ? Urinary Tract Infection  ? Rash  ?  On face  ? ? ?HPI ?Katherine [name] is here today with concerns.  ? ?Six days ago started with dysuria, urinary frequency and urgency. Has let up a little bit since it started, but still unsure if still going on. Does have some lower back pain that is constant. No fever no chills. No suprapubic tenderness.  ? ?She does have an appt coming up with urologist, as she has an issue with the neck of the bladder and was placed on flomax but then had issues with urinary retention/incontinence so went back in to see the urologist and they are now pending a test urodynamics.  ? ?Has noticed some patchy spots of rash on her face on bil cheeks and on her forehead, and she is applying an ointment that clears it right up. The ointment is metronidazole.  ? ?Past Medical History:  ?Diagnosis Date  ? Anemia 2018  ? Anxiety   ? Arthritis   ? Borderline diabetes   ? Chest pain   ? a. 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 Chicken pox   ? age 70  ? Chronic back pain   ? Compressed discs. [place]  ? Depression   ? Dyspnea on exertion   ? a. 03/2009 Echo: EF 65%, no rwma, triv TR.  ? GERD (gastroesophageal reflux disease)   ? High cholesterol   ? Hypertension   ? Hypokalemia   ? Migraine headache   ? Obesity   ? Parkinson's disease ([place])   ? Pre-diabetes   ? okay now   ? ? ?Past Surgical History:  ?Procedure Laterality Date  ? BLADDER SUSPENSION  2005  ? CARDIAC CATHETERIZATION    ? CESAREAN SECTION    ? CLOSED REDUCTION NASAL FRACTURE N/A 01/11/2020  ? Procedure: CLOSED REDUCTION NASAL FRACTURE;  Surgeon: Daria Gust, MD;  Location: [facility];  Service: ENT;  Laterality: N/A;  ? ENDOMETRIAL  ABLATION  2007  ? FOOT SURGERY    ? bilateral bunions  ? LUMBAR LAMINECTOMY/DECOMPRESSION MICRODISCECTOMY Bilateral 01/12/2016  ? Procedure: Bilateral L4-5 Laminotomy/Foraminotomy;  Surgeon: Newman Pies, MD;  Location: [facility];  Service: Neurosurgery;  Laterality: Bilateral;  Bilateral L4-5 Laminotomy/Foraminotomy  ? TONSILLECTOMY  1971  ? ? ?Family History  ?Problem Relation Age of Onset  ? Heart disease Mother   ? Hypertension Mother   ? Heart failure Mother   ?     CHF  ? Diabetes Mother   ? Cancer Mother   ?     CERVICAL CANCER  ? Heart disease Father   ? Diabetes Father   ? Liver disease Father   ? Hypertension Father   ? Diabetes Sister   ? Breast cancer Sister 40  ? Heart disease Brother   ? Heart failure Brother   ?     CHF  ? Diabetes Brother   ? Cancer Brother 43  ?     THROAT AND NECK  ? Esophageal cancer Brother   ? Cancer Maternal Grandfather   ? Colon cancer Neg Hx   ? Stomach cancer Neg Hx   ? Rectal cancer Neg Hx   ? ? ?Social  History  ? ?Socioeconomic History  ? Marital status: Married  ?  Spouse name: Not on file  ? Number of children: Not on file  ? Years of education: Not on file  ? Highest education level: Not on file  ?Occupational History  ? Occupation: Pharmacist, hospital  ?  Employer: [employer]  ?Tobacco Use  ? Smoking status: Never  ? Smokeless tobacco: Never  ?Vaping Use  ? Vaping Use: Never used  ?Substance and Sexual Activity  ? Alcohol use: No  ? Drug use: No  ? Sexual activity: Yes  ?  Partners: Male  ?  Birth control/protection: Post-menopausal  ?Other Topics Concern  ? Not on file  ?Social History Narrative  ? Married.  ? One son is 64, lives in [place].  ? 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  ? Enjoys substitute teaching, gardening, cooking.  ? ?Social Determinants of Health  ? ?Financial Resource Strain: Not on file  ?Food Insecurity: Not on file  ?Transportation Needs: Not on file  ?Physical Activity: Not on file  ?Stress: Not on file  ?Social Connections: Not on file  ?Intimate  Partner Violence: Not on file  ? ? ?Outpatient Medications Prior to Visit  ?Medication Sig Dispense Refill  ? acetaminophen (TYLENOL) 325 MG tablet Take 650 mg by mouth every 6 (six) hours as needed.    ? buPROPion (WELLBUTRIN XL) 150 MG 24 hr tablet Take 1 tablet by mouth every morning.    ? diazepam (VALIUM) 10 MG tablet Take 10 mg by mouth at bedtime.  2  ? DULoxetine (CYMBALTA) 30 MG capsule Take 30 mg by mouth daily. (Total dose = 150 mg)    ? DULoxetine (CYMBALTA) 60 MG capsule Take 120 mg by mouth every morning. (Total dose = 150 mg)    ? famotidine (PEPCID) 20 MG tablet TAKE 1 TABLET (20 MG TOTAL) BY MOUTH DAILY. FOR HEARTBURN. 90 tablet 0  ? gabapentin (NEURONTIN) 300 MG capsule TAKE 1 CAPSULE (300 MG TOTAL) BY MOUTH AT BEDTIME. FOR BACK PAIN. (Patient taking differently: Take 300 mg by mouth at bedtime. For back pain PRN) 90 capsule 2  ? Multiple Vitamin (MULTIVITAMIN) tablet Take 1 tablet by mouth daily.    ? NUPLAZID 34 MG CAPS Take 1 capsule by mouth daily.    ? OVER THE COUNTER MEDICATION Olly Beauty vitamin    ? OVER THE COUNTER MEDICATION 2 tablets daily. Air Shield    ? pantoprazole (PROTONIX) 40 MG tablet Take 1 tablet (40 mg total) by mouth daily. For heartburn. 90 tablet 3  ? simvastatin (ZOCOR) 20 MG tablet Take 20 mg by mouth daily.    ? spironolactone (ALDACTONE) 25 MG tablet Take 1 tablet (25 mg total) by mouth daily. 90 tablet 3  ? tamsulosin (FLOMAX) 0.4 MG CAPS capsule Take 0.4 mg by mouth daily.    ? topiramate (TOPAMAX) 50 MG tablet Take 50 mg by mouth daily.    ? SUMAtriptan (IMITREX) 100 MG tablet Take 100 mg by mouth as needed.    ? cephALEXin (KEFLEX) 500 MG capsule Take 1 capsule (500 mg total) by mouth 2 (two) times daily. 14 capsule 0  ? ?No facility-administered medications prior to visit.  ? ? ?Allergies  ?Allergen Reactions  ? Chlorine Other (See Comments)  ?  Smell causes lightheadedness.  ? Dilaudid [Hydromorphone]   ?  hallucinations  ? Codeine Anxiety  ? Darvon Anxiety  ?  Hydrocodone Anxiety  ? Meperidine Anxiety  ? Oxycodone Anxiety  ? Tramadol  Anxiety  ? Victoza [Liraglutide] Nausea Only and Other (See Comments)  ?  And weakness  ? ? ?ROS ?Review of Systems  ?Constitutional:  Negative for chills and fever.  ?Gastrointestinal:  Negative for abdominal pain.  ?Genitourinary:  Positive for dysuria, frequency, pelvic pain and urgency. Negative for difficulty urinating, flank pain, hematuria and vaginal discharge.  ?Musculoskeletal:  Negative for arthralgias.  ?Skin:  Positive for rash (on face with acne on face).  ? ?  ?Objective:  ?  ?Physical Exam ?Constitutional:   ?   General: She is not in acute distress. ?   Appearance: Normal appearance. She is well-developed and well-groomed. She is not ill-appearing.  ?HENT:  ?   Mouth/Throat:  ?   Pharynx: No pharyngeal swelling.  ?   Tonsils: No tonsillar exudate.  ?Neck:  ?   Thyroid: No thyroid mass.  ?Abdominal:  ?   Tenderness: There is no abdominal tenderness.  ?Musculoskeletal:  ?   Lumbar back: Normal. No tenderness.  ?Lymphadenopathy:  ?   Cervical:  ?   Right cervical: No superficial cervical adenopathy. ?   Left cervical: No superficial cervical adenopathy.  ?Neurological:  ?   Mental Status: She is alert.  ? ? ?BP 122/70   Pulse 88   Temp (!) 97.4 ?F (36.3 ?C)   Resp 16   Ht '5\' 3"'$  (1.6 m)   Wt 185 lb 1 oz (83.9 kg)   SpO2 99%   BMI 32.78 kg/m?  ?Wt Readings from Last 3 Encounters:  ?10/14/21 185 lb 1 oz (83.9 kg)  ?09/30/21 170 lb (77.1 kg)  ?08/27/21 178 lb (80.7 kg)  ? ? ? ?Health Maintenance Due  ?Topic Date Due  ? MAMMOGRAM  06/01/2020  ? ? ?There are no preventive care reminders to display for this patient. ? ?Lab Results  ?Component Value Date  ? TSH 3.33 10/07/2020  ? ?Lab Results  ?Component Value Date  ? WBC 10.1 06/04/2021  ? HGB 11.8 (L) 06/04/2021  ? HCT 38.2 06/04/2021  ? MCV 85.2 06/04/2021  ? PLT 350.0 06/04/2021  ? ?Lab Results  ?Component Value Date  ? NA 139 06/04/2021  ? K 4.0 06/04/2021  ? CO2 24  06/04/2021  ? GLUCOSE 74 06/04/2021  ? BUN 15 06/04/2021  ? CREATININE 0.92 06/04/2021  ? BILITOT 0.6 11/11/2020  ? ALKPHOS 110 11/11/2020  ? AST 20 11/11/2020  ? ALT 12 11/11/2020  ? PROT 7.1 11/11/2020  ? ALBUMIN 4.2 04/

## 2021-10-15 LAB — URINE CULTURE
MICRO NUMBER:: 13195981
SPECIMEN QUALITY:: ADEQUATE

## 2021-10-19 ENCOUNTER — Telehealth: Payer: Self-pay

## 2021-10-19 NOTE — Progress Notes (Signed)
? ? ?  Chronic Care Management ?Pharmacy Assistant  ? ?Name: Katherine [name]  MRN: [id number] DOB: 05/06/1951 ? ?Reason for Encounter: CCM Counsellor) ?  ?Medications: ?Outpatient Encounter Medications as of 10/19/2021  ?Medication Sig  ? acetaminophen (TYLENOL) 325 MG tablet Take 650 mg by mouth every 6 (six) hours as needed.  ? buPROPion (WELLBUTRIN XL) 150 MG 24 hr tablet Take 1 tablet by mouth every morning.  ? diazepam (VALIUM) 10 MG tablet Take 10 mg by mouth at bedtime.  ? DULoxetine (CYMBALTA) 30 MG capsule Take 30 mg by mouth daily. (Total dose = 150 mg)  ? DULoxetine (CYMBALTA) 60 MG capsule Take 120 mg by mouth every morning. (Total dose = 150 mg)  ? famotidine (PEPCID) 20 MG tablet TAKE 1 TABLET (20 MG TOTAL) BY MOUTH DAILY. FOR HEARTBURN.  ? gabapentin (NEURONTIN) 300 MG capsule TAKE 1 CAPSULE (300 MG TOTAL) BY MOUTH AT BEDTIME. FOR BACK PAIN. (Patient taking differently: Take 300 mg by mouth at bedtime. For back pain PRN)  ? Multiple Vitamin (MULTIVITAMIN) tablet Take 1 tablet by mouth daily.  ? NUPLAZID 34 MG CAPS Take 1 capsule by mouth daily.  ? OVER THE COUNTER MEDICATION Ol[employer] vitamin  ? OVER THE COUNTER MEDICATION 2 tablets daily. Air Shield  ? pantoprazole (PROTONIX) 40 MG tablet Take 1 tablet (40 mg total) by mouth daily. For heartburn.  ? simvastatin (ZOCOR) 20 MG tablet Take 20 mg by mouth daily.  ? spironolactone (ALDACTONE) 25 MG tablet Take 1 tablet (25 mg total) by mouth daily.  ? SUMAtriptan (IMITREX) 100 MG tablet Take 100 mg by mouth as needed.  ? tamsulosin (FLOMAX) 0.4 MG CAPS capsule Take 0.4 mg by mouth daily.  ? topiramate (TOPAMAX) 50 MG tablet Take 1 tablet (50 mg total) by mouth daily.  ? ?No facility-administered encounter medications on file as of 10/19/2021.  ? ?Katherine [name] was contacted to remind of upcoming telephone visit with Charlene Brooke on 10/22/2021 at 3:00. Patient was reminded to have any blood glucose and blood pressure readings available  for review at appointment.  ? ?Message was left reminding patient of appointment. ? ?Star Rating Drugs: ?Medication:  Last Fill: Day Supply ?Simvastatin 20 mg 08/25/2020 90 ?Fill date verified with [facility] ? ?Charlene Brooke, CPP notified ? ?Marijean Niemann, RMA ?Clinical Pharmacy Assistant ?[phone] ? ? ? ? ? ?

## 2021-10-19 NOTE — Progress Notes (Deleted)
? ?Cardiology Office Note   ? ?Date:  10/19/2021  ? ?ID:  Katherine [name], DOB 1950-09-11, MRN [id number] ? ?PCP:  [name], NP  ?Cardiologist:  Ida Rogue, MD  ?Electrophysiologist:  None  ? ?Chief Complaint: Follow-up ? ?History of Present Illness:  ? ?Katherine [name] is a 71 y.o. female with history of HTN, Parkinsonism, probable Lewy body dementia, visual hallucination/delusions, COVID in 02/2021, HTN, HLD, iron deficiency anemia, hypokalemia, and chronic back pain who presents for ***. ? ?Prior LHC in 2007 showed normal coronary artery.  Echo in 2010 demonstrated an EF of 65%, normal wall motion, and no significant valvular abnormalities.  Most recent ischemic evaluation via [place] MPI in 08/2017 which showed no evidence of ischemia or scar and was overall low risk.  Echo in 09/2017 demonstrated an EF of 55 to 60%, no regional wall motion abnormalities, grade 1 diastolic dysfunction, normal RV systolic function and PASP.  Most recent echo from 06/2020 demonstrated an EF of 60 to 65%, no regional wall motion abnormalities, grade 1 diastolic dysfunction, normal RV systolic function, and normal PASP.  She was last seen in the office on 07/22/2020 and was doing well from a cardiac perspective.  She reported improvement in her Parkinson's symptoms.  Due to dizziness and fatigue, spironolactone was decreased to 25 mg daily from twice daily dose with recommendation to possibly decrease carvedilol if symptoms persisted. ? ?*** ? ? ?Labs independently reviewed: ?07/2021 - Hgb 14.5, PLT 327, TSH normal ?05/2021 - potassium 4.0, BUN 15, serum creatinine 0.92 ?02/2021 - direct LDL 143, TC 211, TG 266, HDL 57, A1c 6.2 ?10/2020 - albumin 4.2, AST/ALT normal ? ?Past Medical History:  ?Diagnosis Date  ? Anemia 2018  ? Anxiety   ? Arthritis   ? Borderline diabetes   ? Chest pain   ? a. 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 Chicken pox   ?  age 23  ? Chronic back pain   ? Compressed discs. [place]  ? Depression   ? Dyspnea on exertion   ? a. 03/2009 Echo: EF 65%, no rwma, triv TR.  ? GERD (gastroesophageal reflux disease)   ? High cholesterol   ? Hypertension   ? Hypokalemia   ? Migraine headache   ? Obesity   ? Parkinson's disease ([place])   ? Pre-diabetes   ? okay now   ? ? ?Past Surgical History:  ?Procedure Laterality Date  ? BLADDER SUSPENSION  2005  ? CARDIAC CATHETERIZATION    ? CESAREAN SECTION    ? CLOSED REDUCTION NASAL FRACTURE N/A 01/11/2020  ? Procedure: CLOSED REDUCTION NASAL FRACTURE;  Surgeon: Deolinda Gust, MD;  Location: [facility];  Service: ENT;  Laterality: N/A;  ? ENDOMETRIAL ABLATION  2007  ? FOOT SURGERY    ? bilateral bunions  ? LUMBAR LAMINECTOMY/DECOMPRESSION MICRODISCECTOMY Bilateral 01/12/2016  ? Procedure: Bilateral L4-5 Laminotomy/Foraminotomy;  Surgeon: Newman Pies, MD;  Location: [facility];  Service: Neurosurgery;  Laterality: Bilateral;  Bilateral L4-5 Laminotomy/Foraminotomy  ? TONSILLECTOMY  1971  ? ? ?Current Medications: ?No outpatient medications have been marked as taking for the 10/20/21 encounter (Appointment) with Rise Mu, PA-C.  ? ? ?Allergies:   Chlorine, Dilaudid [hydromorphone], Codeine, Darvon, Hydrocodone, Meperidine, Oxycodone, Tramadol, and Victoza [liraglutide]  ? ?Social History  ? ?Socioeconomic History  ? Marital status: Married  ?  Spouse name: Not on file  ?  Number of children: Not on file  ? Years of education: Not on file  ? Highest education level: Not on file  ?Occupational History  ? Occupation: Pharmacist, hospital  ?  Employer: [employer]  ?Tobacco Use  ? Smoking status: Never  ? Smokeless tobacco: Never  ?Vaping Use  ? Vaping Use: Never used  ?Substance and Sexual Activity  ? Alcohol use: No  ? Drug use: No  ? Sexual activity: Yes  ?  Partners: Male  ?  Birth control/protection: Post-menopausal  ?Other Topics Concern  ? Not on file  ?Social  History Narrative  ? Married.  ? One son is 15, lives in [place].  ? 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  ? Enjoys substitute teaching, gardening, cooking.  ? ?Social Determinants of Health  ? ?Financial Resource Strain: Not on file  ?Food Insecurity: Not on file  ?Transportation Needs: Not on file  ?Physical Activity: Not on file  ?Stress: Not on file  ?Social Connections: Not on file  ?  ? ?Family History:  ?The patient's family history includes Breast cancer (age of onset: 43) in her sister; Cancer in her maternal grandfather and mother; Cancer (age of onset: 40) in her brother; Diabetes in her brother, father, mother, and sister; Esophageal cancer in her brother; Heart disease in her brother, father, and mother; Heart failure in her brother and mother; Hypertension in her father and mother; Liver disease in her father. There is no history of Colon cancer, Stomach cancer, or Rectal cancer. ? ?ROS:   ?ROS ? ? ?EKGs/Labs/Other Studies Reviewed:   ? ?Studies reviewed were summarized above. The additional studies were reviewed today: ? ?2D echo 07/03/2020: ?1. Left ventricular ejection fraction, by estimation, is 60 to 65%. The  ?left ventricle has normal function. The left ventricle has no regional  ?wall motion abnormalities. Left ventricular diastolic parameters are  ?consistent with Grade I diastolic  ?dysfunction (impaired relaxation).  ? 2. Right ventricular systolic function is normal. The right ventricular  ?size is normal. There is normal pulmonary artery systolic pressure. The  ?estimated right ventricular systolic pressure is 16.0 mmHg. ?__________ ? ?2D echo 10/14/2017: ?- Left ventricle: The cavity size was normal. Systolic function was  ?  normal. The estimated ejection fraction was in the range of 55%  ?  to 60%. Wall motion was normal; there were no regional wall  ?  motion abnormalities. Doppler parameters are consistent with  ?  abnormal left ventricular relaxation (grade 1 diastolic  ?  dysfunction).  ?- Left  atrium: The atrium was normal in size.  ?- Right ventricle: Systolic function was normal.  ?- Pulmonary arteries: Systolic pressure was within the normal  ?  range. ?__________ ? ?[place] MPI 08/24/2017: ?Normal pharmacologic myocardial perfusion stress test without ischemia or scar. ?Small, hyperdynamic left ventricle with normal wall motion. LVEF >65%. ?This is a low risk study. ? ? ?EKG:  EKG is ordered today.  The EKG ordered today demonstrates *** ? ?Recent Labs: ?11/11/2020: ALT 12 ?06/04/2021: BUN 15; Creatinine, Ser 0.92; Hemoglobin 11.8; Platelets 350.0; Potassium 4.0; Sodium 139  ?Recent Lipid Panel ?   ?Component Value Date/Time  ? CHOL 211 (H) 03/10/2021 0845  ? CHOL 123 06/22/2013 0809  ? TRIG 266.0 (H) 03/10/2021 0845  ? HDL 57.40 03/10/2021 0845  ? HDL 48 06/22/2013 0809  ? CHOLHDL 4 03/10/2021 0845  ? VLDL 53.2 (H) 03/10/2021 0845  ? Sanders 60 04/15/2020 1241  ? Guadalupe Guerra 60 06/22/2013 0809  ? LDLDIRECT 143.0 03/10/2021  0845  ? ? ?PHYSICAL EXAM:   ? ?VS:  There were no vitals taken for this visit.  BMI: There is no height or weight on file to calculate BMI. ? ?Physical Exam ? ?Wt Readings from Last 3 Encounters:  ?10/14/21 185 lb 1 oz (83.9 kg)  ?09/30/21 170 lb (77.1 kg)  ?08/27/21 178 lb (80.7 kg)  ?  ? ?ASSESSMENT & PLAN:  ? ?Tachycardia: ? ?Dizziness: ? ?HTN: Blood pressure ? ?HLD: LDL 143 in 02/2021. ? ?Parkinsonism/visual hallucinations/cognitive impairment/probable Lewy body dementia: Followed by neurology at [facility]. ? ? ?{Are you ordering a CV Procedure (e.g. stress test, cath, DCCV, TEE, etc)?   Press F2        :[id number]}  ? ? ? ?Disposition: F/u with Dr. Rockey Situ or an APP in ***. ? ? ?Medication Adjustments/Labs and Tests Ordered: ?Current medicines are reviewed at length with the patient today.  Concerns regarding medicines are outlined above. Medication changes, Labs and Tests ordered today are summarized above and listed in the Patient Instructions accessible in Encounters.   ? ?Signed, ?Christell Faith, PA-C ?10/19/2021 9:14 AM    ? ?[place] ?[place], [place] [address] ?[phone] ?

## 2021-10-20 ENCOUNTER — Ambulatory Visit: Payer: Medicare Other | Admitting: Physician Assistant

## 2021-10-21 ENCOUNTER — Ambulatory Visit: Payer: Medicare Other | Admitting: Primary Care

## 2021-10-21 DIAGNOSIS — G20 Parkinson's disease: Secondary | ICD-10-CM | POA: Diagnosis not present

## 2021-10-21 DIAGNOSIS — E669 Obesity, unspecified: Secondary | ICD-10-CM | POA: Diagnosis not present

## 2021-10-21 DIAGNOSIS — G3183 Dementia with Lewy bodies: Secondary | ICD-10-CM | POA: Diagnosis not present

## 2021-10-21 DIAGNOSIS — I10 Essential (primary) hypertension: Secondary | ICD-10-CM | POA: Diagnosis not present

## 2021-10-21 DIAGNOSIS — K219 Gastro-esophageal reflux disease without esophagitis: Secondary | ICD-10-CM | POA: Diagnosis not present

## 2021-10-21 DIAGNOSIS — F0280 Dementia in other diseases classified elsewhere without behavioral disturbance: Secondary | ICD-10-CM | POA: Diagnosis not present

## 2021-10-21 DIAGNOSIS — H25011 Cortical age-related cataract, right eye: Secondary | ICD-10-CM | POA: Diagnosis not present

## 2021-10-21 DIAGNOSIS — F32A Depression, unspecified: Secondary | ICD-10-CM | POA: Diagnosis not present

## 2021-10-21 DIAGNOSIS — H2511 Age-related nuclear cataract, right eye: Secondary | ICD-10-CM | POA: Diagnosis not present

## 2021-10-21 DIAGNOSIS — Z6834 Body mass index (BMI) 34.0-34.9, adult: Secondary | ICD-10-CM | POA: Diagnosis not present

## 2021-10-22 ENCOUNTER — Telehealth: Payer: Medicare Other

## 2021-10-22 ENCOUNTER — Telehealth: Payer: Self-pay | Admitting: Pharmacist

## 2021-10-22 NOTE — Progress Notes (Deleted)
? ?Chronic Care Management ?Pharmacy Note ? ?10/22/2021 ?Name:  Katherine [name] MRN:  [id number] DOB:  1950-11-18 ? ?Summary: CCM F/U visit ?-Pt would like to pursue iron infusion again ?-Pt started taking simvastatin 20 mg again (was previously off for several months). Added to med list. ?-2019 DEXA scan/FRAX score indicate patient was a candidate for pharmacologic treatment of osteopenia. She has repeat DEXA 06/2021 ?-Pt is taking Aleve daily for back pain ? ?Recommendations/Changes made from today's visit: ?-Coordinate with PCP for iron infusion ?-Consider Prolia pending DEXA results 06/2021 ?-Advised to use Tylenol instead of Aleve on chronic basis ? ?Plan: ?-[place] will call patient in 1 month to ensure mammogram and DEXA scan occur ?-Pharmacist follow up televisit scheduled for in 2 months ? ? ?Subjective: ?Katherine [name] is an 71 y.o. year old female who is a primary patient of [name], NP.  The CCM team was consulted for assistance with disease management and care coordination needs.   ? ?Engaged with patient by telephone for follow up visit in response to provider referral for pharmacy case management and/or care coordination services.  ? ?Consent to Services:  ?The patient was given information about Chronic Care Management services, agreed to services, and gave verbal consent prior to initiation of services.  Please see initial visit note for detailed documentation.  ? ?Patient Care Team: ?[name], NP as PCP - General (Nurse Practitioner) ?Minna Merritts, MD as PCP - Cardiology (Cardiology) ?Minna Merritts, MD as Consulting Physician (Cardiology) ?Festus Aloe, MD as Consulting Physician (Urology) ?[name], NP as Nurse Practitioner ?Tamari Busic, Cleaster [place], [facility] as Pharmacist (Pharmacist) ? ?Patient lives at home with her husband. 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  ? ?Recent office visits: ?10/14/21 NP Tabitha Dugal OV: c/o dysuria. Advised f/u with  urology. ? ?09/30/21 Dr Glori Bickers OV: c/o dysuria. Rx'd Keflex. ? ?08/27/21 Alma Friendly NP OV: c/o dysuria. Urine culture and chlamydia/gonorrhea negative. ? ?06/04/21-PCP-Katherine Clark,NP-Patient presented for dysuria and fatigue,labs ordered(Kidneys, electrolytes look good.Iron levels are low again.   ?We could do another iron infusion if she'd like. Rx'd bactrim for UTI. ? ?03/10/21-PCP-Katherine Clark,NP-Patient presented for follow up recurrent UTI's,and Parkinson's disease.Labs ordered(UA negative) Vitamin D level low, blood sugars prediabetic range,referral for home health PT, ? ?Recent consult visits: ?09/24/21 Dr Revonda Standard (Neurology [facility]): f/u Memory Loss, Parkinsonism.. Probably Lewy Body dementia. Retry carbidopa-levodopa with slow titration. ? ?08/12/21 Dr Lise Auer (Neurology - [facility]): f/u hallucinations, cognitive impairment. Advised to f/u with psych to change diazepam, possible topiramate d/t negative impact on cognition. ?  ?05/11/21-[facility]-Philip Lamptey,MD-Patient presented for dysuria.UA,xrays, Acute Cystitis-start Keflex 572m twice daily for 5 days,increase fluid intake. ?02/27/21-[facility]-Kristin Boddu FNP-Patient presented for dysuria.UA ordered,start Cephalexin 5041m1 capsule twice daily for 7 days ?02/22/21-[facility]-Kimberly Alves,NP-Patient presented for Covid-19 test ? ?01/22/21-[facility]-Ian Lee,MD- f/u Lewy body dementia w/ delusions. continue Nuplazid 3442m capsule daily. D/W PCP, psychiatrist if antipsychotics (haloperidol, olanzapine) would be an option for controlling severe delusions, would likely make Parkinsonism worse. ? ?12/24/20- LisNoemi Chapelherapy session- no data found ?12/11/20-Urology- MatFestus Aloeo data found ? ?Hospital visits: ?None in previous 6 months ? ? ?Objective: ? ?Lab Results  ?Component Value Date  ? CREATININE 0.92 06/04/2021  ? BUN 15 06/04/2021  ? GFR 63.47 06/04/2021  ? GFRNONAA >60 11/11/2020  ?  GFRAA 90 03/03/2018  ? NA 139 06/04/2021  ? K 4.0 06/04/2021  ? CALCIUM  9.1 06/04/2021  ? CO2 24 06/04/2021  ? GLUCOSE 74 06/04/2021  ? ? ?Lab Results  ?Component Value Date/Time  ? HGBA1C 6.2 03/10/2021 08:45 AM  ? HGBA1C 6.3 04/15/2020 12:41 PM  ? GFR 63.47 06/04/2021 03:48 PM  ? GFR 68.26 08/14/2020 12:43 PM  ?  ?Last diabetic Eye exam: No results found for: HMDIABEYEEXA  ?Last diabetic Foot exam: No results found for: HMDIABFOOTEX  ? ?Lab Results  ?Component Value Date  ? CHOL 211 (H) 03/10/2021  ? HDL 57.40 03/10/2021  ? [place] 60 04/15/2020  ? LDLDIRECT 143.0 03/10/2021  ? TRIG 266.0 (H) 03/10/2021  ? CHOLHDL 4 03/10/2021  ? ? ? ?  Latest Ref Rng & Units 11/11/2020  ? 11:58 AM 04/15/2020  ? 12:41 PM 07/09/2019  ?  7:43 AM  ?Hepatic Function  ?Total Protein 6.5 - 8.1 g/dL 7.1   6.6   7.0    ?Albumin 3.5 - 5.0 g/dL 4.2   3.9   4.1    ?AST 15 - 41 U/L 20   10   12     ?ALT 0 - 44 U/L 12   6   9     ?Alk Phosphatase 38 - 126 U/L 110   98   115    ?Total Bilirubin 0.3 - 1.2 mg/dL 0.6   0.5   0.3    ? ? ?Lab Results  ?Component Value Date/Time  ? TSH 3.33 10/07/2020 08:50 AM  ? TSH 1.93 03/13/2018 04:15 PM  ? ? ? ?  Latest Ref Rng & Units 06/04/2021  ?  3:48 PM 03/10/2021  ?  8:45 AM 11/11/2020  ? 11:58 AM  ?CBC  ?WBC 4.0 - 10.5 K/uL 10.1   9.6   11.5    ?Hemoglobin 12.0 - 15.0 g/dL 11.8   12.1   14.0    ?Hematocrit 36.0 - 46.0 % 38.2   38.4   44.8    ?Platelets 150.0 - 400.0 K/uL 350.0   324.0   326    ? ? ?Lab Results  ?Component Value Date/Time  ? VD25OH 21.57 (L) 03/10/2021 08:45 AM  ? VD25OH 38.63 04/15/2020 12:41 PM  ? ? ?Clinical ASCVD: No  ?The 10-year ASCVD risk score (Arnett DK, et al., 2019) is: 10.2% ?  Values used to calculate the score: ?    Age: 35 years ?    Sex: Female ?    Is Non-Hispanic African American: No ?    Diabetic: No ?    Tobacco smoker: No ?    Systolic Blood Pressure: 220 mmHg ?    Is BP treated: Yes ?    HDL Cholesterol: 57.4 mg/dL ?    Total Cholesterol: 211 mg/dL   ? ? ?  02/24/2021  ?   4:15 PM 04/15/2020  ? 11:45 AM 07/09/2019  ? 12:16 PM  ?Depression screen PHQ 2/9  ?Decreased Interest 0 0 3  ?Down, Depressed, Hopeless 1 1 3   ?PHQ - 2 Score 1 1 6   ?Altered sleeping 0 0 3  ?Tired, decreased energy 3 3 0  ?Change in appetite 3 3 0  ?Feeling bad or failure about yourself  3 3 0  ?Trouble concentrating 0 0 0  ?Moving slowly or fidgety/restless 0 0 0  ?Suicidal thoughts  0 0  ?PHQ-9 Score 10 10 9   ?Difficult doing work/chores  Not difficult at all Somewhat difficult  ?  ? ?Social History  ? ?Tobacco Use  ?Smoking Status Never  ?Smokeless Tobacco  Never  ? ?BP Readings from Last 3 Encounters:  ?10/14/21 122/70  ?09/30/21 124/80  ?08/27/21 124/78  ? ?Pulse Readings from Last 3 Encounters:  ?10/14/21 88  ?09/30/21 (!) 110  ?08/27/21 (!) 112  ? ?Wt Readings from Last 3 Encounters:  ?10/14/21 185 lb 1 oz (83.9 kg)  ?09/30/21 170 lb (77.1 kg)  ?08/27/21 178 lb (80.7 kg)  ? ?BMI Readings from Last 3 Encounters:  ?10/14/21 32.78 kg/m?  ?09/30/21 30.11 kg/m?  ?08/27/21 31.53 kg/m?  ? ? ?Assessment/Interventions: Review of patient past medical history, allergies, medications, health status, including review of consultants reports, laboratory and other test data, was performed as part of comprehensive evaluation and provision of chronic care management services.  ? ?SDOH:  (Social Determinants of Health) assessments and interventions performed: Yes ? ?SDOH Screenings  ? ?Alcohol Screen: Not on file  ?Depression (PHQ2-9): Medium Risk  ? PHQ-2 Score: 10  ?Financial Resource Strain: Not on file  ?Food Insecurity: Not on file  ?Housing: Not on file  ?Physical Activity: Not on file  ?Social Connections: Not on file  ?Stress: Not on file  ?Tobacco Use: Low Risk   ? Smoking Tobacco Use: Never  ? Smokeless Tobacco Use: Never  ? Passive Exposure: Not on file  ?Transportation Needs: Not on file  ? ? ?[place] ? ?Allergies  ?Allergen Reactions  ? Chlorine Other (See Comments)  ?  Smell causes lightheadedness.  ?  Dilaudid [Hydromorphone]   ?  hallucinations  ? Codeine Anxiety  ? Darvon Anxiety  ? Hydrocodone Anxiety  ? Meperidine Anxiety  ? Oxycodone Anxiety  ? Tramadol Anxiety  ? Victoza [Liraglutide] Nausea Only and Ot

## 2021-10-22 NOTE — Telephone Encounter (Signed)
?  Chronic Care Management  ? ?Outreach Note ? ?10/22/2021 ?Name: KRISTAN BRUMMITT MRN: [id number] DOB: August 27, 1950 ? ?Referred by: [name], NP ? ?Patient had a phone appointment scheduled with clinical pharmacist today. ? ?An unsuccessful telephone outreach was attempted today. The patient was referred to the pharmacist for assistance with medications, care management and care coordination.  ? ?Patient will NOT be penalized in any way for missing a CCM appointment. The no-show fee does not apply. ? ?If possible, a message was left to return call to: [phone] or to [facility]. ? ?Charlene Brooke, PharmD, BCACP ?Clinical Pharmacist ?[facility] ?[phone] ? ? ?

## 2021-10-23 DIAGNOSIS — L218 Other seborrheic dermatitis: Secondary | ICD-10-CM | POA: Diagnosis not present

## 2021-10-23 DIAGNOSIS — L718 Other rosacea: Secondary | ICD-10-CM | POA: Diagnosis not present

## 2021-10-25 ENCOUNTER — Other Ambulatory Visit: Payer: Self-pay | Admitting: Primary Care

## 2021-10-25 DIAGNOSIS — K219 Gastro-esophageal reflux disease without esophagitis: Secondary | ICD-10-CM

## 2021-10-27 DIAGNOSIS — R21 Rash and other nonspecific skin eruption: Secondary | ICD-10-CM | POA: Diagnosis not present

## 2021-10-28 NOTE — Progress Notes (Signed)
Called patient to reschedule her missed telephone appointment with Charlene Brooke on 10/22/2021 @ 3:00 pm. No answer; left message.  ? ?Charlene Brooke, CPP notified ? ?Marijean Niemann, RMA ?Clinical Pharmacy Assistant ?[phone] ? ?

## 2021-11-03 DIAGNOSIS — F3175 Bipolar disorder, in partial remission, most recent episode depressed: Secondary | ICD-10-CM | POA: Diagnosis not present

## 2021-11-10 DIAGNOSIS — R41840 Attention and concentration deficit: Secondary | ICD-10-CM | POA: Diagnosis not present

## 2021-11-18 ENCOUNTER — Ambulatory Visit (INDEPENDENT_AMBULATORY_CARE_PROVIDER_SITE_OTHER): Payer: Medicare Other | Admitting: Family

## 2021-11-18 ENCOUNTER — Encounter: Payer: Self-pay | Admitting: Family

## 2021-11-18 VITALS — BP 130/80 | HR 114 | Temp 97.9°F | Resp 16 | Ht 63.0 in | Wt 182.5 lb

## 2021-11-18 DIAGNOSIS — L209 Atopic dermatitis, unspecified: Secondary | ICD-10-CM | POA: Insufficient documentation

## 2021-11-18 DIAGNOSIS — R441 Visual hallucinations: Secondary | ICD-10-CM | POA: Diagnosis not present

## 2021-11-18 MED ORDER — CLOBETASOL PROPIONATE 0.05 % EX SOLN: 1.0000 "application " | Freq: Two times a day (BID) | CUTANEOUS | 0 refills | Status: DC

## 2021-11-18 NOTE — Patient Instructions (Addendum)
Recommend that you start taking head and shoulders shampoo at home which may help and or blue selsum shampoo.  ?Stop ketoconazole shampoo as this may be irritating your scalp.  ? ?Continue with clobetasol solution, apply in lines, apply nightly for the next ten nights and then after that continue with maintenance days (pick two days out of the week, say Monday and Friday) and apply twice weekly ongoing. Apply it after a shower.  ? ?Due to recent changes in healthcare laws, you may see results of your imaging and/or laboratory studies on MyChart before I have had a chance to review them.  I understand that in some cases there may be results that are confusing or concerning to you. Please understand that not all results are received at the same time and often I may need to interpret multiple results in order to provide you with the best plan of care or course of treatment. Therefore, I ask that you please give me 2 business days to thoroughly review all your results before contacting my office for clarification. Should we see a critical lab result, you will be contacted sooner.  ? ?It was a pleasure seeing you today! Please do not hesitate to reach out with any questions and or concerns. ? ?Regards,  ? ?Miangel Flom ?FNP-C ? ? ? ? ? ? ?

## 2021-11-18 NOTE — Progress Notes (Signed)
? ?Established Patient Office Visit ? ?Subjective:  ?Patient ID: Katherine [name], female    DOB: 01-01-1951  Age: 71 y.o. MRN: [id number] ? ?CC:  ?Chief Complaint  ?Patient presents with  ? Allergic Reaction  ? ? ?HPI ?Katherine [name] is here today with concerns.  ? ?States that her scalp is infested with 'moths' and imbedded so deeply causing a red space. States she can not tell it by looking at her, but hurts and burns. Has seen dermatologist, was given clobetasol solution as well as ketoconazole. She states clobetasol did provide her with relief.  ? ?She is currently using paul mitchell for colored hair at home.  ? ?Pt does have some confusion between what;s real and what is not.  ?She seems to visualize different hallucinations such as moths on her bedspread, which she states eat through her bedspread and cover her at night. She has also stated to me prior that her head was infested with lice and also would find mounds on them on her pillow.  ?Last seen by neurology 09/24/2021, recent PET scan possible lewy body dementia however unsure per neurologist note.  ? ?Past Medical History:  ?Diagnosis Date  ? Anemia 2018  ? Anxiety   ? Arthritis   ? Borderline diabetes   ? Chest pain   ? a. 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 Chicken pox   ? age 41  ? Chronic back pain   ? Compressed discs. [place]  ? Depression   ? Dyspnea on exertion   ? a. 03/2009 Echo: EF 65%, no rwma, triv TR.  ? GERD (gastroesophageal reflux disease)   ? High cholesterol   ? Hypertension   ? Hypokalemia   ? Migraine headache   ? Obesity   ? Parkinson's disease ([place])   ? Pre-diabetes   ? okay now   ? ? ?Past Surgical History:  ?Procedure Laterality Date  ? BLADDER SUSPENSION  2005  ? CARDIAC CATHETERIZATION    ? CESAREAN SECTION    ? CLOSED REDUCTION NASAL FRACTURE N/A 01/11/2020  ? Procedure: CLOSED REDUCTION NASAL FRACTURE;  Surgeon:  Aidaly Gust, MD;  Location: [place];  Service: ENT;  Laterality: N/A;  ? ENDOMETRIAL ABLATION  2007  ? FOOT SURGERY    ? bilateral bunions  ? LUMBAR LAMINECTOMY/DECOMPRESSION MICRODISCECTOMY Bilateral 01/12/2016  ? Procedure: Bilateral L4-5 Laminotomy/Foraminotomy;  Surgeon: Newman Pies, MD;  Location: [place] [facility];  Service: Neurosurgery;  Laterality: Bilateral;  Bilateral L4-5 Laminotomy/Foraminotomy  ? TONSILLECTOMY  1971  ? ? ?Family History  ?Problem Relation Age of Onset  ? Heart disease Mother   ? Hypertension Mother   ? Heart failure Mother   ?     CHF  ? Diabetes Mother   ? Cancer Mother   ?     CERVICAL CANCER  ? Heart disease Father   ? Diabetes Father   ? Liver disease Father   ? Hypertension Father   ? Diabetes Sister   ? Breast cancer Sister 30  ? Heart disease Brother   ? Heart failure Brother   ?     CHF  ? Diabetes Brother   ? Cancer Brother 27  ?     THROAT AND NECK  ? Esophageal cancer Brother   ? Cancer Maternal Grandfather   ? Colon cancer Neg Hx   ? Stomach cancer Neg Hx   ? Rectal  cancer Neg Hx   ? ? ?Social History  ? ?Socioeconomic History  ? Marital status: Married  ?  Spouse name: Not on file  ? Number of children: Not on file  ? Years of education: Not on file  ? Highest education level: Not on file  ?Occupational History  ? Occupation: Pharmacist, hospital  ?  Employer: [employer]  ?Tobacco Use  ? Smoking status: Never  ? Smokeless tobacco: Never  ?Vaping Use  ? Vaping Use: Never used  ?Substance and Sexual Activity  ? Alcohol use: No  ? Drug use: No  ? Sexual activity: Yes  ?  Partners: Male  ?  Birth control/protection: Post-menopausal  ?Other Topics Concern  ? Not on file  ?Social History Narrative  ? Married.  ? One son is 74, lives in [place].  ? 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  ? Enjoys substitute teaching, gardening, cooking.  ? ?Social Determinants of Health  ? ?Financial Resource Strain: Not on file  ?Food Insecurity: Not on file  ?Transportation Needs: Not on file   ?Physical Activity: Not on file  ?Stress: Not on file  ?Social Connections: Not on file  ?Intimate Partner Violence: Not on file  ? ? ?Outpatient Medications Prior to Visit  ?Medication Sig Dispense Refill  ? acetaminophen (TYLENOL) 325 MG tablet Take 650 mg by mouth every 6 (six) hours as needed.    ? buPROPion (WELLBUTRIN XL) 150 MG 24 hr tablet Take 1 tablet by mouth every morning.    ? diazepam (VALIUM) 10 MG tablet Take 10 mg by mouth at bedtime.  2  ? DULoxetine (CYMBALTA) 30 MG capsule Take 30 mg by mouth daily. (Total dose = 150 mg)    ? DULoxetine (CYMBALTA) 60 MG capsule Take 120 mg by mouth every morning. (Total dose = 150 mg)    ? famotidine (PEPCID) 20 MG tablet TAKE 1 TABLET (20 MG TOTAL) BY MOUTH DAILY. FOR HEARTBURN. 90 tablet 1  ? gabapentin (NEURONTIN) 300 MG capsule TAKE 1 CAPSULE (300 MG TOTAL) BY MOUTH AT BEDTIME. FOR BACK PAIN. (Patient taking differently: Take 300 mg by mouth at bedtime. For back pain PRN) 90 capsule 2  ? Multiple Vitamin (MULTIVITAMIN) tablet Take 1 tablet by mouth daily.    ? NUPLAZID 34 MG CAPS Take 1 capsule by mouth daily.    ? OVER THE COUNTER MEDICATION [employer] vitamin    ? OVER THE COUNTER MEDICATION 2 tablets daily. Air Shield    ? pantoprazole (PROTONIX) 40 MG tablet Take 1 tablet (40 mg total) by mouth daily. For heartburn. 90 tablet 3  ? simvastatin (ZOCOR) 20 MG tablet Take 20 mg by mouth daily.    ? spironolactone (ALDACTONE) 25 MG tablet Take 1 tablet (25 mg total) by mouth daily. 90 tablet 3  ? tamsulosin (FLOMAX) 0.4 MG CAPS capsule Take 0.4 mg by mouth daily.    ? topiramate (TOPAMAX) 50 MG tablet Take 1 tablet (50 mg total) by mouth daily.    ? SUMAtriptan (IMITREX) 100 MG tablet Take 100 mg by mouth as needed.    ? ?No facility-administered medications prior to visit.  ? ? ?Allergies  ?Allergen Reactions  ? Chlorine Other (See Comments)  ?  Smell causes lightheadedness.  ? Dilaudid [Hydromorphone]   ?  hallucinations  ? Codeine Anxiety  ? Darvon  Anxiety  ? Hydrocodone Anxiety  ? Meperidine Anxiety  ? Oxycodone Anxiety  ? Tramadol Anxiety  ? Victoza [Liraglutide] Nausea Only and Other (See Comments)  ?  And weakness  ? ? ?ROS ?Review of Systems  ?Constitutional:  Negative for chills, fatigue and fever.  ?Skin:  Positive for rash (itchy scalp with irritation and redness describes as burning).  ?Psychiatric/Behavioral:  Positive for confusion and hallucinations (visual).   ? ?  ?Objective:  ?  ?Physical Exam ?Constitutional:   ?   General: She is not in acute distress. ?   Appearance: Normal appearance. She is obese. She is not ill-appearing, toxic-appearing or diaphoretic.  ?Skin: ?   Comments: Mild redness in few localized areas on scalp  ?No plaquing, few dandruff flakes  ?Neurological:  ?   Mental Status: She is alert and oriented to person, place, and time. Mental status is at baseline.  ?Psychiatric:     ?   Attention and Perception: Attention normal.     ?   Mood and Affect: Mood normal.     ?   Speech: Speech normal.     ?   Behavior: Behavior is cooperative.     ?   Thought Content: Thought content is delusional. Thought content does not include suicidal ideation. Thought content does not include suicidal plan.  ? ? ?BP 130/80   Pulse (!) 114   Temp 97.9 ?F (36.6 ?C)   Resp 16   Ht '5\' 3"'$  (1.6 m)   Wt 182 lb 8 oz (82.8 kg)   SpO2 96%   BMI 32.33 kg/m?  ?Wt Readings from Last 3 Encounters:  ?11/18/21 182 lb 8 oz (82.8 kg)  ?10/14/21 185 lb 1 oz (83.9 kg)  ?09/30/21 170 lb (77.1 kg)  ? ? ? ?Health Maintenance Due  ?Topic Date Due  ? MAMMOGRAM  06/01/2020  ? ? ?There are no preventive care reminders to display for this patient. ? ?Lab Results  ?Component Value Date  ? TSH 3.33 10/07/2020  ? ?Lab Results  ?Component Value Date  ? WBC 10.1 06/04/2021  ? HGB 11.8 (L) 06/04/2021  ? HCT 38.2 06/04/2021  ? MCV 85.2 06/04/2021  ? PLT 350.0 06/04/2021  ? ?Lab Results  ?Component Value Date  ? NA 139 06/04/2021  ? K 4.0 06/04/2021  ? CO2 24 06/04/2021  ?  GLUCOSE 74 06/04/2021  ? BUN 15 06/04/2021  ? CREATININE 0.92 06/04/2021  ? BILITOT 0.6 11/11/2020  ? ALKPHOS 110 11/11/2020  ? AST 20 11/11/2020  ? ALT 12 11/11/2020  ? PROT 7.1 11/11/2020  ? ALBUMIN 4.2 11/11/2020  ?

## 2021-11-18 NOTE — Assessment & Plan Note (Signed)
Continue ongoing f/u with neurologist, possible lewy body dementia. ?

## 2021-11-18 NOTE — Assessment & Plan Note (Signed)
Refill clobetasol 0.05%  ?Advised on once nightly x 10 days then maintenance two times weekly  ?Recommend head and shoulders for gentle scalp care  ?selsum blue otc also an option  ?

## 2021-11-22 NOTE — Progress Notes (Deleted)
Date:  11/22/2021   ID:  [name], Katherine [name] 16-Feb-1951, MRN [id number]  Patient Location:  [place] [address]   Provider location:   [facility], [place] office  PCP:  [name], NP  Cardiologist:  [facility]  No chief complaint on file.   History of Present Illness:    Katherine [name] is a 71 y.o. female  past medical history of CP with no CAD on cath 2007,  HTN  Morbid obesity Chronic back pain, prior back surgery Possible sleep apnea by history Parkinson's  She returns today for routine follow-up Of her blood pressure, history of hypokalemia  Last seen by myself January 2022  In follow-up today she reports that she feels well On nuplazid, per [facility] "I can walk", within 5 days Finished PT Hallucinations gone "still walking" "Still losing memory"  Low BP at times and tired with higher dose coreg  Taking coreg 12.5 in the AM, 25 in the PM, "for fast heart rate" No sx from tachycardia  Brings in BP measurements today Some SBP 97-103, on avg 110 to 120  Echo reviewed on her trip today  1. Left ventricular ejection fraction, by estimation, is 60 to 65%. The  left ventricle has normal function. The left ventricle has no regional  wall motion abnormalities. Left ventricular diastolic parameters are  consistent with Grade I diastolic  dysfunction (impaired relaxation).   2. Right ventricular systolic function is normal. The right ventricular  size is normal. There is normal pulmonary artery systolic pressure. The  estimated right ventricular systolic pressure is 61.4 mmHg.   Labs reviewed, anemia HGB 9.7 Received iron infusion Total chol 130, LDL 60   Prior CV studies:   The following studies were reviewed today:    Past Medical History:  Diagnosis Date   Anemia 2018   Anxiety    Arthritis    Borderline diabetes    Chest pain    a. 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 [facility]   Depression    Dyspnea on exertion    a. 03/2009 Echo: EF 65%, no rwma, triv TR.   GERD (gastroesophageal reflux disease)    High cholesterol    Hypertension    Hypokalemia    Migraine headache    Obesity    Parkinson's disease ([place])    Pre-diabetes    okay now    Past Surgical History:  Procedure Laterality Date   BLADDER SUSPENSION  2005   CARDIAC CATHETERIZATION     CESAREAN SECTION     CLOSED REDUCTION NASAL FRACTURE N/A 01/11/2020   Procedure: CLOSED REDUCTION NASAL FRACTURE;  Surgeon: Shermeka Gust, MD;  Location: [place];  Service: ENT;  Laterality: N/A;   ENDOMETRIAL ABLATION  2007   FOOT SURGERY     bilateral bunions   LUMBAR LAMINECTOMY/DECOMPRESSION MICRODISCECTOMY Bilateral 01/12/2016   Procedure: Bilateral L4-5 Laminotomy/Foraminotomy;  Surgeon: Newman Pies, MD;  Location: [facility];  Service: Neurosurgery;  Laterality: Bilateral;  Bilateral L4-5 Laminotomy/Foraminotomy   TONSILLECTOMY  1971      Allergies:   Chlorine, Dilaudid [hydromorphone], Codeine, Darvon, Hydrocodone, Meperidine, Oxycodone, Tramadol, and Victoza [liraglutide]   Social History   Tobacco Use   Smoking status: Never   Smokeless  tobacco: Never  Vaping Use   Vaping Use: Never used  Substance Use Topics   Alcohol use: No   Drug use: No     Current Outpatient Medications on File Prior to Visit  Medication Sig Dispense Refill   acetaminophen (TYLENOL) 325 MG tablet Take 650 mg by mouth every 6 (six) hours as needed.     buPROPion (WELLBUTRIN XL) 150 MG 24 hr tablet Take 1 tablet by mouth every morning.     clobetasol (TEMOVATE) 0.05 % external solution Apply 1 application. topically 2 (two) times daily. 50 mL 0   diazepam (VALIUM) 10 MG tablet Take 10 mg by mouth at bedtime.  2   DULoxetine (CYMBALTA) 30 MG  capsule Take 30 mg by mouth daily. (Total dose = 150 mg)     DULoxetine (CYMBALTA) 60 MG capsule Take 120 mg by mouth every morning. (Total dose = 150 mg)     famotidine (PEPCID) 20 MG tablet TAKE 1 TABLET (20 MG TOTAL) BY MOUTH DAILY. FOR HEARTBURN. 90 tablet 1   gabapentin (NEURONTIN) 300 MG capsule TAKE 1 CAPSULE (300 MG TOTAL) BY MOUTH AT BEDTIME. FOR BACK PAIN. (Patient taking differently: Take 300 mg by mouth at bedtime. For back pain PRN) 90 capsule 2   Multiple Vitamin (MULTIVITAMIN) tablet Take 1 tablet by mouth daily.     NUPLAZID 34 MG CAPS Take 1 capsule by mouth daily.     OVER THE COUNTER MEDICATION [employer] vitamin     OVER THE COUNTER MEDICATION 2 tablets daily. Air Shield     pantoprazole (PROTONIX) 40 MG tablet Take 1 tablet (40 mg total) by mouth daily. For heartburn. 90 tablet 3   simvastatin (ZOCOR) 20 MG tablet Take 20 mg by mouth daily.     spironolactone (ALDACTONE) 25 MG tablet Take 1 tablet (25 mg total) by mouth daily. 90 tablet 3   SUMAtriptan (IMITREX) 100 MG tablet Take 100 mg by mouth as needed.     tamsulosin (FLOMAX) 0.4 MG CAPS capsule Take 0.4 mg by mouth daily.     topiramate (TOPAMAX) 50 MG tablet Take 1 tablet (50 mg total) by mouth daily.     No current facility-administered medications on file prior to visit.     Family Hx: The patient's family history includes Breast cancer (age of onset: 96) in her sister; Cancer in her maternal grandfather and mother; Cancer (age of onset: 71) in her brother; Diabetes in her brother, father, mother, and sister; Esophageal cancer in her brother; Heart disease in her brother, father, and mother; Heart failure in her brother and mother; Hypertension in her father and mother; Liver disease in her father. There is no history of Colon cancer, Stomach cancer, or Rectal cancer.  ROS:   Please see the history of present illness.    Review of Systems  Constitutional: Negative.   HENT: Negative.    Respiratory: Negative.     Cardiovascular: Negative.   Gastrointestinal: Negative.   Musculoskeletal: Negative.   Neurological:  Positive for dizziness.  Psychiatric/Behavioral: Negative.    All other systems reviewed and are negative.   Labs/Other Tests and Data Reviewed:    Recent Labs: 06/04/2021: BUN 15; Creatinine, Ser 0.92; Hemoglobin 11.8; Platelets 350.0; Potassium 4.0; Sodium 139   Recent Lipid Panel Lab Results  Component Value Date/Time   CHOL 211 (H) 03/10/2021 08:45 AM   CHOL 123 06/22/2013 08:09 AM   TRIG 266.0 (H) 03/10/2021 08:45 AM   HDL 57.40 03/10/2021 08:45 AM  HDL 48 06/22/2013 08:09 AM   CHOLHDL 4 03/10/2021 08:45 AM   LDLCALC 60 04/15/2020 12:41 PM   LDLCALC 60 06/22/2013 08:09 AM   LDLDIRECT 143.0 03/10/2021 08:45 AM    Wt Readings from Last 3 Encounters:  11/18/21 182 lb 8 oz (82.8 kg)  10/14/21 185 lb 1 oz (83.9 kg)  09/30/21 170 lb (77.1 kg)     Exam:    There were no vitals taken for this visit.  Constitutional:  oriented to person, place, and time. No distress.  HENT:  Head: Grossly normal Eyes:  no discharge. No scleral icterus.  Neck: No JVD, no carotid bruits  Cardiovascular: Regular rate and rhythm, no murmurs appreciated Pulmonary/Chest: Clear to auscultation bilaterally, no wheezes or rails Abdominal: Soft.  no distension.  no tenderness.  Musculoskeletal: Normal range of motion Neurological:  normal muscle tone. Coordination normal. No atrophy Skin: Skin warm and dry Psychiatric: normal affect, pleasant   ASSESSMENT & PLAN:    Problem List Items Addressed This Visit   None Dizziness Rare episodes, more fatigue sx on higher coreg 12.5 twice daily Will decrease spironolactone down to 25 daily from 25 twice daily May need to decrease the dose of Coreg if she continues to have symptoms  Falls Denies any recent falls on new medication Dx with parkinsons, Followed by neurology Recommended regular exercise program  Tachycardia Improved symptoms  on carvedilol,  Plan as above, may need to decrease the dose for orthostasis  Parkinson's Followed by neurology Reports dramatic improvement in symptoms on her current medications   Total encounter time more than 25 minutes  Greater than 50% was spent in counseling and coordination of care with the patient    Signed, Ida Rogue, MD  [place] Office [id number], [place], [place] [address]

## 2021-11-23 ENCOUNTER — Ambulatory Visit: Payer: Medicare Other | Admitting: Cardiovascular Disease

## 2021-11-24 ENCOUNTER — Other Ambulatory Visit: Payer: Self-pay | Admitting: Primary Care

## 2021-11-24 DIAGNOSIS — E2839 Other primary ovarian failure: Secondary | ICD-10-CM

## 2021-11-24 DIAGNOSIS — Z1231 Encounter for screening mammogram for malignant neoplasm of breast: Secondary | ICD-10-CM

## 2021-11-25 ENCOUNTER — Ambulatory Visit: Payer: Medicare Other | Admitting: Primary Care

## 2021-11-25 NOTE — Telephone Encounter (Signed)
Unable to reach pt by phone and sending my chart note to Gentry Fitz NP, Joellen CMA and lsc triage. ?

## 2021-11-26 NOTE — Telephone Encounter (Signed)
Left message to return call to our office.  

## 2021-11-26 NOTE — Telephone Encounter (Signed)
Pt called and scheduled appt for 12/08/21, she said she most of next week except for Monday so had to schedule for the week after. She wanted to know if you would like her to come in for labs prior to appt and asked for a call back to let her know. Callback is [phone] ?

## 2021-11-26 NOTE — Telephone Encounter (Signed)
Please notify patient that we will draw her labs the day of her appointment.  We need to discuss her symptoms first. ?

## 2021-11-27 NOTE — Telephone Encounter (Signed)
Informed patient of the message below, patient understood and will see [place] on 5.23.23, she is unable to come into the office next week ?

## 2021-11-27 NOTE — Telephone Encounter (Addendum)
PLEASE NOTE: All timestamps contained within this report are represented as [place] Standard Time. ?CONFIDENTIALTY NOTICE: This fax transmission is intended only for the addressee. It contains information that is legally privileged, confidential or otherwise protected from ?use or disclosure. If you are not the intended recipient, you are strictly prohibited from reviewing, disclosing, copying using or disseminating any of this information or taking any ?action in reliance on or regarding this information. If you have received this fax in error, please notify us immediately by telephone so that we can arrange for its return to [place]. ?Phone: [phone], Toll-Free: [phone], Fax: [phone] ?Page: 1 of 1 ?Call Id: [id number] ?[place] Night - Client ?Nonclinical Telephone Record  ?[facility]? ?Client [place] Night - Client ?Client Site [place] ?Provider Alma Friendly - NP ?Contact Type Call ?Who Is Calling Patient / Member / Family / Caregiver ?Caller Name Lenia Housley ?Caller Phone Number [phone] ?Call Type Message Only Information Provided ?Reason for Call Returning a Call from the Office ?Initial Comment Caller states she just missed a call from [name]. ?Disp. Time Disposition Final User ?11/26/2021 5:08:43 PM General Information Provided Yes Junius Creamer ?Call Closed By: Junius Creamer ?Transaction Date/Time: 11/26/2021 5:06:25 PM (ET) ?

## 2021-11-27 NOTE — Telephone Encounter (Signed)
See phone note taken by Amy. Patient scheduled for an appointment with PCP. ?

## 2021-11-29 ENCOUNTER — Other Ambulatory Visit: Payer: Self-pay | Admitting: Family

## 2021-11-29 DIAGNOSIS — L209 Atopic dermatitis, unspecified: Secondary | ICD-10-CM

## 2021-11-30 ENCOUNTER — Telehealth: Payer: Self-pay

## 2021-11-30 NOTE — Telephone Encounter (Signed)
Patient called office requesting call from Amy. Declined to give me any information  ?
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
## 2021-12-02 ENCOUNTER — Other Ambulatory Visit: Payer: Self-pay

## 2021-12-02 ENCOUNTER — Emergency Department
Admission: EM | Admit: 2021-12-02 | Discharge: 2021-12-02 | Disposition: A | Discharge status: Home / Self Care | Payer: Medicare Other | Attending: Emergency Medicine | Admitting: Emergency Medicine

## 2021-12-02 ENCOUNTER — Telehealth: Payer: Self-pay | Admitting: Primary Care

## 2021-12-02 DIAGNOSIS — R110 Nausea: Secondary | ICD-10-CM | POA: Insufficient documentation

## 2021-12-02 DIAGNOSIS — R000 Tachycardia, unspecified: Secondary | ICD-10-CM | POA: Insufficient documentation

## 2021-12-02 DIAGNOSIS — I10 Essential (primary) hypertension: Secondary | ICD-10-CM | POA: Diagnosis not present

## 2021-12-02 DIAGNOSIS — G20 Parkinson's disease: Secondary | ICD-10-CM | POA: Insufficient documentation

## 2021-12-02 DIAGNOSIS — G3183 Dementia with Lewy bodies: Secondary | ICD-10-CM | POA: Insufficient documentation

## 2021-12-02 DIAGNOSIS — F0280 Dementia in other diseases classified elsewhere without behavioral disturbance: Secondary | ICD-10-CM | POA: Insufficient documentation

## 2021-12-02 DIAGNOSIS — R55 Syncope and collapse: Secondary | ICD-10-CM | POA: Diagnosis not present

## 2021-12-02 LAB — BASIC METABOLIC PANEL
Anion gap: 9 (ref 5–15)
BUN: 14 mg/dL (ref 8–23)
CO2: 22 mmol/L (ref 22–32)
Calcium: 8.8 mg/dL — ABNORMAL LOW (ref 8.9–10.3)
Chloride: 109 mmol/L (ref 98–111)
Creatinine, Ser: 0.82 mg/dL (ref 0.44–1.00)
GFR, Estimated: >60 mL/min (ref >60)
Glucose, Bld: 143 mg/dL — ABNORMAL HIGH (ref 70–99)
Potassium: 3.6 mmol/L (ref 3.5–5.1)
Sodium: 140 mmol/L (ref 135–145)

## 2021-12-02 LAB — CBC
HCT: 45.8 % (ref 36.0–46.0)
Hemoglobin: 14 g/dL (ref 12.0–15.0)
MCH: 29.5 pg (ref 26.0–34.0)
MCHC: 30.6 g/dL (ref 30.0–36.0)
MCV: 96.6 fL (ref 80.0–100.0)
Platelets: 345 10*3/uL (ref 150–400)
RBC: 4.74 MIL/uL (ref 3.87–5.11)
RDW: 13.2 % (ref 11.5–15.5)
WBC: 9 10*3/uL (ref 4.0–10.5)
nRBC: 0 % (ref 0.0–0.2)

## 2021-12-02 LAB — URINALYSIS, ROUTINE W REFLEX MICROSCOPIC
Bilirubin Urine: NEGATIVE
Glucose, UA: NEGATIVE mg/dL
Hgb urine dipstick: NEGATIVE
Ketones, ur: NEGATIVE mg/dL
Leukocytes,Ua: NEGATIVE
Nitrite: NEGATIVE
Protein, ur: NEGATIVE mg/dL
Specific Gravity, Urine: 1.004 — ABNORMAL LOW (ref 1.005–1.030)
pH: 7 (ref 5.0–8.0)

## 2021-12-02 LAB — TSH: TSH: 1.18 u[IU]/mL (ref 0.350–4.500)

## 2021-12-02 NOTE — Telephone Encounter (Signed)
[place] Day - Client ?TELEPHONE ADVICE RECORD ?[facility]? ?Patient ?Name: ?Katherine W ?ILLIAMS ?Gender: Female ?DOB: 05/20/1951 ?Age: 71 Y 1 M 22 D ?Return ?Phone ?Number: ?[id number] ?(Primary), ?[id number] ?(Secondary) ?Address: ?City/ ?State/ ?Zip: ?[place] ? [address] ?Client [place] Day - Client ?Client Site [place] - Day ?Provider Alma Friendly - NP ?Contact Type Call ?Who Is Calling Patient / Member / Family / Caregiver ?Call Type Triage / Clinical ?Relationship To Patient Self ?Return Phone Number [phone] (Primary) ?Chief Complaint Head Injury (non urgent symptom) ?Reason for Call Symptomatic / Request for Health Information ?Initial Comment Caller states she was at airport this morning but ?she got dizzy, fell (hit her forhead and states it was ?bleeding a little bit) and vomited (denies blood). ?States she has been feeling weak for a while. Was ?transferred from the office for triage. ?Translation No ?Nurse Assessment ?Nurse: Altamease Oiler, RN, Adriana Date/Time (Eastern Time): 12/02/2021 10:19:53 AM ?Confirm and document reason for call. If ?symptomatic, describe symptoms. ?---pt states she fell d/t dizziness. hit head. pt also ?reported n/v. is at the airport. will be travelling back ?home by car. no dizziness currently but has been ?sitting since she fell. last week she was feeling weak. ?Does the patient have any new or worsening ?symptoms? ---Yes ?Will a triage be completed? ---Yes ?Related visit to physician within the last 2 weeks? ---No ?Does the PT have any chronic conditions? (i.e. ?diabetes, asthma, this includes High risk factors for ?pregnancy, etc.) ?---Yes ?List chronic conditions. ---parkinson's htn depression anxiety ?Is this a behavioral health or substance abuse call? ---No ?Guidelines ?Guideline Title Affirmed Question Affirmed Notes Nurse Date/Time (Eastern ?Time) ?Head Injury Vomiting once  or ?more ?Altamease Oiler, RN, [place] 12/02/2021 10:25:01 ?AM ?Disp. Time (Eastern ?Time) Disposition Final User ?12/02/2021 10:26:34 AM Go to ED Now Yes Altamease Oiler, RN, Adriana ?PLEASE NOTE: All timestamps contained within this report are represented as [place] Standard Time. ?CONFIDENTIALTY NOTICE: This fax transmission is intended only for the addressee. It contains information that is legally privileged, confidential or ?otherwise protected from use or disclosure. If you are not the intended recipient, you are strictly prohibited from reviewing, disclosing, copying using ?or disseminating any of this information or taking any action in reliance on or regarding this information. If you have received this fax in error, please ?notify us immediately by telephone so that we can arrange for its return to [place]. Phone: [phone], Toll-Free: [phone], Fax: [phone] ?Page: 2 of 2 ?Call Id: [id number] ?Caller Disagree/Comply Comply ?Caller Understands Yes ?PreDisposition Call Doctor ?Care Advice Given Per Guideline ?GO TO ED NOW: CARE ADVICE given per Head Injury (Adult) guideline. ANOTHER ADULT SHOULD DRIVE: * It is better ?and safer if another adult drives instead of you. ?Comments ?User: Kizzie Fantasia, RN Date/Time Eilene Ghazi Time): 12/02/2021 10:27:37 AM ?pt is currently in [place]. advised to be seen there rather than driving back to be seen at [place] ?Referrals ?GO TO FACILITY UNDECIDED ?[place]

## 2021-12-02 NOTE — ED Provider Notes (Signed)
[facility] Emergency Department Provider Note     Event Date/Time   First MD Initiated Contact with Patient 12/02/21 1226     (approximate)   History   Fall   HPI  Katherine [name] is a 71 y.o. female with history of Parkinson's, Lewy body dementia, hypertension, hypokalemia, anemia, presents to the ED for evaluation of a fall.  Patient presents via personal vehicle from the airport, where she experienced dizziness and a near-fall.  Patient also reports some nausea without vomiting following the incident.  Patient presents with any acute pain at this time.  Patient present in the ED with her husband, who was a witness to the incident.  Patient would admit that she has not eaten since yesterday afternoon.  She did skip breakfast morning, and they were rushing through the airport to catch a flight that was ultimately delayed.  Patient began to experience some near syncopal episodes, was able to lower herself to the ground holding onto a chair.  She has had previous episodes of similar incidences.  She is being followed by neurology as well as cardiology.  Patient reports a high resting heart rate, but has not missed any of her medications.  She denies any headache, LOC, or weakness.   Physical Exam   Triage Vital Signs: ED Triage Vitals  Enc Vitals Group     BP 12/02/21 1155 118/72     Pulse Rate 12/02/21 1155 (!) 101     Resp 12/02/21 1155 18     Temp 12/02/21 1155 (!) 96 F (35.6 C)     Temp Source 12/02/21 1155 Oral     SpO2 12/02/21 1155 96 %     Weight --      Height --      Head Circumference --      Peak Flow --      Pain Score 12/02/21 1132 0     Pain Loc --      Pain Edu? --      Excl. in [place]? --     Most recent vital signs: Vitals:   12/02/21 1155 12/02/21 1421  BP: 118/72 118/72  Pulse: (!) 101 99  Resp: 18 17  Temp: (!) 96 F (35.6 C) 97.7 F (36.5 C)  SpO2: 96% 97%    General Awake, no distress.  HEENT NCAT. PERRL. EOMI.  No rhinorrhea. Mucous membranes are moist.  CV:  Good peripheral perfusion. Tachy rate RESP:  Normal effort.  ABD:  No distention.    ED Results / Procedures / Treatments   Labs (all labs ordered are listed, but only abnormal results are displayed) Labs Reviewed  BASIC METABOLIC PANEL - Abnormal; Notable for the following components:      Result Value   Glucose, Bld 143 (*)    Calcium 8.8 (*)    All other components within normal limits  URINALYSIS, ROUTINE W REFLEX MICROSCOPIC - Abnormal; Notable for the following components:   Color, Urine STRAW (*)    APPearance CLEAR (*)    Specific Gravity, Urine 1.004 (*)    All other components within normal limits  CBC  TSH  T3, FREE     EKG  Vent. rate 101 BPM PR interval 122 ms QRS duration 84 ms QT/QTcB 338/438 ms P-R-T axes 9 2 74 Tachy rate Sinus rhythm  No STEMI  RADIOLOGY  No results found.   PROCEDURES:  Critical Care performed: No  Procedures   MEDICATIONS ORDERED  IN ED: Medications - No data to display   IMPRESSION / MDM / [place] / ED COURSE  I reviewed the triage vital signs and the nursing notes.                              Differential diagnosis includes, but is not limited to, intracranial pathology such as stroke or intracerebral hemorrhage; fever or infectious causes including sepsis; hypoxemia and/or hypercarbia; uremia; trauma; endocrine related disorders such as diabetes, hypoglycemia, and thyroid-related diseases; hypertensive encephalopathy; hypotension, etc.  Patient with a history of Parkinson's, presents to the ED for evaluation of near syncopal episode.  Patient was walking to the airport, when she felt lightheaded.  She was able to lower herself to the ground and denies any outright syncope, head injury, or LOC.  Patient presents at this time for evaluation reporting no current complaints.  She gives a history of similar episodes in the past.  Normal and reassuring work-up at  this time.  No evidence of any acute electrolyte abnormalities, leukocytosis, critical anemia.  No EKG evidence of malignant arrhythmia.  Patient's exam is without signs of acute neuromuscular deficit, hypotension, or dehydration.  Patient's diagnosis is consistent with near syncope. Patient will be discharged home with directions to take her home medicines. Patient is to follow up with her primary provider as needed or otherwise directed. Patient is given ED precautions to return to the ED for any worsening or new symptoms.   FINAL CLINICAL IMPRESSION(S) / ED DIAGNOSES   Final diagnoses:  Near syncope     Rx / DC Orders   ED Discharge Orders     None        Note:  This document was prepared using Dragon voice recognition software and may include unintentional dictation errors.    Melvenia Needles, PA-C 12/02/21 1953    Vanessa Vivian, MD 12/03/21 [phone]

## 2021-12-02 NOTE — Telephone Encounter (Signed)
I spoke with pt; pt was at [facility] and was at boarding when pt got very dizzy and fell to carpeted floor. Pt said she did not cut herself and did not loss consciousness but pt was even more dizzy after fall and vomited x 1 after fall. Pt is almost to [facility] ED now to get eval. Pt is alert and talking appropriately Pt has no H/A or CP. Sending note to Gentry Fitz NP. ?

## 2021-12-02 NOTE — Discharge Instructions (Addendum)
Your exam and labs are normal at this time. Take your home meds as directed. Follow-up with your provider as scheduled.  ?

## 2021-12-02 NOTE — Telephone Encounter (Signed)
Noted.  Patient currently at [facility] ED and has been evaluated. ?

## 2021-12-02 NOTE — ED Triage Notes (Signed)
Pt comes pov with a dizziness fall at the airport. Hx of htn, anemia. Pt became nauseous after the fall. Pt denies any pain now. Is not on thinners.  ?

## 2021-12-02 NOTE — Telephone Encounter (Signed)
Pt called and said "she was at the airport in [place] about to get on a flight and she fell and got really sick, she threw up everything she ate. She is not feeling well" and wanted to let Anda Kraft and the nurse know, I transferred her to Access Nurse.  ? ?Callback Number: [phone] ?

## 2021-12-03 LAB — T3, FREE: T3, Free: 2.6 pg/mL (ref 2.0–4.4)

## 2021-12-08 ENCOUNTER — Encounter: Payer: Self-pay | Admitting: Primary Care

## 2021-12-08 ENCOUNTER — Ambulatory Visit (INDEPENDENT_AMBULATORY_CARE_PROVIDER_SITE_OTHER): Payer: Medicare Other | Admitting: Primary Care

## 2021-12-08 DIAGNOSIS — R238 Other skin changes: Secondary | ICD-10-CM

## 2021-12-08 DIAGNOSIS — G3184 Mild cognitive impairment, so stated: Secondary | ICD-10-CM | POA: Insufficient documentation

## 2021-12-08 DIAGNOSIS — F0280 Dementia in other diseases classified elsewhere without behavioral disturbance: Secondary | ICD-10-CM | POA: Diagnosis not present

## 2021-12-08 DIAGNOSIS — G3183 Dementia with Lewy bodies: Secondary | ICD-10-CM

## 2021-12-08 DIAGNOSIS — F332 Major depressive disorder, recurrent severe without psychotic features: Secondary | ICD-10-CM | POA: Diagnosis not present

## 2021-12-08 DIAGNOSIS — G3109 Other frontotemporal dementia: Secondary | ICD-10-CM | POA: Diagnosis not present

## 2021-12-08 DIAGNOSIS — R55 Syncope and collapse: Secondary | ICD-10-CM

## 2021-12-08 NOTE — Patient Instructions (Addendum)
Please avoid using anything on your scalp except for your Aveda shampoo and conditioner.  Please speak with your psychiatrist regarding your anxiety and depression.  It was a pleasure to see you today!

## 2021-12-08 NOTE — Assessment & Plan Note (Signed)
Reviewed neurology notes from March 2023 through [facility]. Reviewed genetic testing results form April 2023 through [facility].   Continue Nuplazid 34 mg daily. Continue following with psychiatry for anxiety and depression

## 2021-12-08 NOTE — Assessment & Plan Note (Signed)
Diagnosis reviewed from recent genetic testing through care everywhere.  Evident today given HPI. Continue Nuplazid 34 mg daily.  Continue neurology and psychiatry follow-up.

## 2021-12-08 NOTE — Assessment & Plan Note (Signed)
Unclear cause, ED work-up unremarkable.  Reviewed ED labs with patient today, fortunately all labs appeared grossly normal. We discussed to always eat breakfast with morning meds, proper hydration.  She does follow with cardiology and will update them.  ED notes, labs, imaging reviewed

## 2021-12-08 NOTE — Assessment & Plan Note (Signed)
Scabbing to scalp appears to be self-inflicted, discussed this with both patient and her husband today.  Stop clobetasol solution, stop vinegar washes.  Discussed to only use her routine shampoo and conditioner products. Strongly advised she stop picking at her head to allow for healing of her scabs. She agreed.

## 2021-12-08 NOTE — Progress Notes (Signed)
Subjective:    Patient ID: Katherine [name], female    DOB: 1951/07/10, 71 y.o.   MRN: [id number]  HPI  Katherine [name] is a very pleasant 71 y.o. female with a history of Lewy Body Dementia, hypertension, Parkinson's Disease, chronic back pain, hyperlipidemia, prediabetes, anemia, chronic fatigue, iron deficiency anemia, migraines who presents today for ED follow up. Her husband joins [place] today.  She presented to [facility] ED on 11/29/21 after an episode of near syncope with dizziness and a near fall. She and her [place] were at the airport, rushing to catch a flight just prior to symptom onset. She endorsed not eating that morning.   During her ED visit she underwent ECG which was negative for acute changes. Labs were without acute electrolyte changes and without anemia. She was discharged home later that day.  Since her ED visit she denies near syncopal episodes. She continues to experience chronic fatigue for which she attributes to stress. She is following with psychiatry, has an appointment later this afternoon. She is compliant to her regimen of Cymbalta 120 mg daily, Wellbutrin XL 150 mg daily.   She would like to discuss soreness to her scalp. Evaluated by Lawerance Bach, NP on 11/18/21 for scalp infestation with moths. Prior to this visit she saw dermatology who prescribed clobetasol solution and ketoconazole.  Today she endorses that she saw "two funny looking moths on my pillow" three months ago so she "took them and placed them into the toilet". Since then she believes the moths have laid eggs into her scalp. She could feel the eggs burrow into the skin of her scalp. She continues to notice soreness to the scalp from "where the moths are feeding and when they have enough blood they stop". Her husband reports that he's noticed her picking at her scalp often, even during the night.   She's been using clobetasol propionate 0.5% solution and washing her hair in vinegar without improvement. She's  also colored her hair a few times with hair dye and that has not killed the moth eggs. She sees brown specs and notices bumps to her scalp when she combs through her hair.  She continues to follow with with neurology for evaluation of Lewy Body Dementia and has had an extensive work up per the [facility] clinic. She does not believe she has Lewy Body dementia, she denies hallucinations. She recently underwent genetic work up for her symptoms and was diagnosed with frontotemporal dementia.   BP Readings from Last 3 Encounters:  12/08/21 134/62  12/02/21 118/72  11/18/21 130/80      Review of Systems  Constitutional:  Positive for fatigue.  Skin:        Scalp soreness  Neurological:  Negative for dizziness and headaches.  Psychiatric/Behavioral:  The patient is nervous/anxious.         Past Medical History:  Diagnosis Date   Anemia 2018   Anxiety    Arthritis    Borderline diabetes    Chest pain    a. Normal coronaries by cath 2007; Anomalous RCA arising from LAD diagonal (versus total native RCA with collateral)   Chicken pox    age 39   Chronic back pain    Compressed discs. [facility]   Depression    Dyspnea on exertion    a. 03/2009 Echo: EF 65%, no rwma, triv TR.   GERD (gastroesophageal reflux disease)    High cholesterol    Hypertension    Hypokalemia  Migraine headache    Obesity    Parkinson's disease ([place])    Pre-diabetes    okay now     Social History   Socioeconomic History   Marital status: Married    Spouse name: Not on file   Number of children: Not on file   Years of education: Not on file   Highest education level: Not on file  Occupational History   Occupation: Product manager: [employer]  Tobacco Use   Smoking status: Never   Smokeless tobacco: Never  Vaping Use   Vaping Use: Never used  Substance and Sexual Activity   Alcohol use: No   Drug use: No   Sexual activity: Yes    Partners: Male     Birth control/protection: Post-menopausal  Other Topics Concern   Not on file  Social History Narrative   Married.   One son is 63, lives in [place].   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   Enjoys substitute teaching, gardening, cooking.   Social Determinants of Health   Financial Resource Strain: Not on file  Food Insecurity: Not on file  Transportation Needs: Not on file  Physical Activity: Not on file  Stress: Not on file  Social Connections: Not on file  Intimate Partner Violence: Not on file    Past Surgical History:  Procedure Laterality Date   BLADDER SUSPENSION  2005   [place] N/A 01/11/2020   Procedure: CLOSED REDUCTION NASAL FRACTURE;  Surgeon: Tykia Gust, MD;  Location: [facility];  Service: ENT;  Laterality: N/A;   ENDOMETRIAL ABLATION  2007   FOOT SURGERY     bilateral bunions   LUMBAR LAMINECTOMY/DECOMPRESSION MICRODISCECTOMY Bilateral 01/12/2016   Procedure: Bilateral L4-5 Laminotomy/Foraminotomy;  Surgeon: Newman Pies, MD;  Location: [facility];  Service: Neurosurgery;  Laterality: Bilateral;  Bilateral L4-5 Laminotomy/Foraminotomy   TONSILLECTOMY  1971    Family History  Problem Relation Age of Onset   Heart disease Mother    Hypertension Mother    Heart failure Mother        CHF   Diabetes Mother    Cancer Mother        CERVICAL CANCER   Heart disease Father    Diabetes Father    Liver disease Father    Hypertension Father    Diabetes Sister    Breast cancer Sister 84   Heart disease Brother    Heart failure Brother        CHF   Diabetes Brother    Cancer Brother 46       THROAT AND NECK   Esophageal cancer Brother    Cancer Maternal Grandfather    Colon cancer Neg Hx    Stomach cancer Neg Hx    Rectal cancer Neg Hx     Allergies  Allergen Reactions   Chlorine Other (See Comments)    Smell causes lightheadedness.   Dilaudid [Hydromorphone]      hallucinations   Codeine Anxiety   Darvon Anxiety   Hydrocodone Anxiety   Meperidine Anxiety   Oxycodone Anxiety   Tramadol Anxiety   Victoza [Liraglutide] Nausea Only and Other (See Comments)    And weakness    Current Outpatient Medications on File Prior to Visit  Medication Sig Dispense Refill   acetaminophen (TYLENOL) 325 MG tablet Take 650 mg by mouth every 6 (six) hours as needed.     buPROPion (  WELLBUTRIN XL) 150 MG 24 hr tablet Take 1 tablet by mouth every morning.     clobetasol (TEMOVATE) 0.05 % external solution Apply 1 application. topically 2 (two) times daily. 50 mL 0   diazepam (VALIUM) 10 MG tablet Take 10 mg by mouth at bedtime.  2   DULoxetine (CYMBALTA) 30 MG capsule Take 30 mg by mouth daily. (Total dose = 150 mg)     DULoxetine (CYMBALTA) 60 MG capsule Take 120 mg by mouth every morning. (Total dose = 150 mg)     famotidine (PEPCID) 20 MG tablet TAKE 1 TABLET (20 MG TOTAL) BY MOUTH DAILY. FOR HEARTBURN. 90 tablet 1   gabapentin (NEURONTIN) 300 MG capsule TAKE 1 CAPSULE (300 MG TOTAL) BY MOUTH AT BEDTIME. FOR BACK PAIN. (Patient taking differently: Take 300 mg by mouth at bedtime. For back pain PRN) 90 capsule 2   Multiple Vitamin (MULTIVITAMIN) tablet Take 1 tablet by mouth daily.     NUPLAZID 34 MG CAPS Take 1 capsule by mouth daily.     OVER THE COUNTER MEDICATION Ol[employer] Beauty vitamin     OVER THE COUNTER MEDICATION 2 tablets daily. Air Shield     pantoprazole (PROTONIX) 40 MG tablet Take 1 tablet (40 mg total) by mouth daily. For heartburn. 90 tablet 3   simvastatin (ZOCOR) 20 MG tablet Take 20 mg by mouth daily.     spironolactone (ALDACTONE) 25 MG tablet Take 1 tablet (25 mg total) by mouth daily. 90 tablet 3   tamsulosin (FLOMAX) 0.4 MG CAPS capsule Take 0.4 mg by mouth daily.     topiramate (TOPAMAX) 50 MG tablet Take 1 tablet (50 mg total) by mouth daily.     SUMAtriptan (IMITREX) 100 MG tablet Take 100 mg by mouth as needed.     No current  facility-administered medications on file prior to visit.    BP 134/62   Pulse 97   Temp 97.6 F (36.4 C) (Oral)   Ht '5\' 3"'$  (1.6 m)   Wt 183 lb (83 kg)   SpO2 96%   BMI 32.42 kg/m  Objective:   Physical Exam Cardiovascular:     Rate and Rhythm: Normal rate and regular rhythm.  Pulmonary:     Effort: Pulmonary effort is normal.     Breath sounds: Normal breath sounds.  Musculoskeletal:     Cervical back: Neck supple.  Skin:    General: Skin is warm and dry.     Comments: Numerous small scabs to scalp in various stages of healing.  Three small circular wounds to lower abdomen, healing.   No scaling or erythema noted to scalp.  No insects or evidence of burrowing noted to scalp.           Assessment & Plan:

## 2021-12-16 ENCOUNTER — Ambulatory Visit
Admission: RE | Admit: 2021-12-16 | Discharge: 2021-12-16 | Disposition: A | Discharge status: Home / Self Care | Payer: Medicare Other | Source: Ambulatory Visit | Attending: Primary Care | Admitting: Primary Care

## 2021-12-16 DIAGNOSIS — E2839 Other primary ovarian failure: Secondary | ICD-10-CM

## 2021-12-16 DIAGNOSIS — M85852 Other specified disorders of bone density and structure, left thigh: Secondary | ICD-10-CM | POA: Diagnosis not present

## 2021-12-16 DIAGNOSIS — Z780 Asymptomatic menopausal state: Secondary | ICD-10-CM | POA: Diagnosis not present

## 2021-12-18 ENCOUNTER — Telehealth: Payer: Self-pay

## 2021-12-18 NOTE — Progress Notes (Signed)
    Chronic Care Management Pharmacy Assistant   Name: Katherine [name]  MRN: [id number] DOB: August 28, 1950  Reason for Encounter: CCM (Appointment Reminder)   Medications: Outpatient Encounter Medications as of 12/18/2021  Medication Sig   acetaminophen (TYLENOL) 325 MG tablet Take 650 mg by mouth every 6 (six) hours as needed.   buPROPion (WELLBUTRIN XL) 150 MG 24 hr tablet Take 1 tablet by mouth every morning.   clobetasol (TEMOVATE) 0.05 % external solution Apply 1 application. topically 2 (two) times daily.   diazepam (VALIUM) 10 MG tablet Take 10 mg by mouth at bedtime.   DULoxetine (CYMBALTA) 30 MG capsule Take 30 mg by mouth daily. (Total dose = 150 mg)   DULoxetine (CYMBALTA) 60 MG capsule Take 120 mg by mouth every morning. (Total dose = 150 mg)   famotidine (PEPCID) 20 MG tablet TAKE 1 TABLET (20 MG TOTAL) BY MOUTH DAILY. FOR HEARTBURN.   gabapentin (NEURONTIN) 300 MG capsule TAKE 1 CAPSULE (300 MG TOTAL) BY MOUTH AT BEDTIME. FOR BACK PAIN. (Patient taking differently: Take 300 mg by mouth at bedtime. For back pain PRN)   Multiple Vitamin (MULTIVITAMIN) tablet Take 1 tablet by mouth daily.   NUPLAZID 34 MG CAPS Take 1 capsule by mouth daily.   OVER THE COUNTER MEDICATION Olly Beauty vitamin   OVER THE COUNTER MEDICATION 2 tablets daily. Air Shield   pantoprazole (PROTONIX) 40 MG tablet Take 1 tablet (40 mg total) by mouth daily. For heartburn.   simvastatin (ZOCOR) 20 MG tablet Take 20 mg by mouth daily.   spironolactone (ALDACTONE) 25 MG tablet Take 1 tablet (25 mg total) by mouth daily.   SUMAtriptan (IMITREX) 100 MG tablet Take 100 mg by mouth as needed.   tamsulosin (FLOMAX) 0.4 MG CAPS capsule Take 0.4 mg by mouth daily.   topiramate (TOPAMAX) 50 MG tablet Take 1 tablet (50 mg total) by mouth daily.   No facility-administered encounter medications on file as of 12/18/2021.   Katherine [name] was contacted to remind of upcoming telephone visit with Charlene Brooke on  12/22/2021 at 3:00. Patient was reminded to have any blood glucose and blood pressure readings available for review at appointment.   Message was left reminding patient of appointment.  CCM referral has been placed prior to visit?  No   Star Rating Drugs: Medication:  Last Fill: Day Supply Simvastatin 20 mg 08/25/2020 90 Fill date verified with [facility]  Charlene Brooke, CPP notified  Marijean Niemann, Central Pharmacy Assistant [phone]

## 2021-12-19 ENCOUNTER — Encounter (INDEPENDENT_AMBULATORY_CARE_PROVIDER_SITE_OTHER): Payer: Medicare Other

## 2021-12-19 DIAGNOSIS — G3109 Other frontotemporal dementia: Secondary | ICD-10-CM | POA: Diagnosis not present

## 2021-12-19 DIAGNOSIS — F0280 Dementia in other diseases classified elsewhere without behavioral disturbance: Secondary | ICD-10-CM

## 2021-12-19 DIAGNOSIS — R2681 Unsteadiness on feet: Secondary | ICD-10-CM

## 2021-12-19 DIAGNOSIS — R55 Syncope and collapse: Secondary | ICD-10-CM

## 2021-12-20 NOTE — Telephone Encounter (Signed)
Please see the MyChart message reply(ies) for my assessment and plan.  The patient gave consent for this Medical Advice Message and is aware that it may result in a bill to their insurance company as well as the possibility that this may result in a co-payment or deductible. They are an established patient, but are not seeking medical advice exclusively about a problem treated during an in person or video visit in the last 7 days. I did not recommend an in person or video visit within 7 days of my reply.  I spent a total of 7 minutes cumulative time within 7 days through MyChart messaging Jw Covin K Zeeshan Korte, NP  

## 2021-12-22 ENCOUNTER — Telehealth: Payer: Self-pay | Admitting: Pharmacist

## 2021-12-22 ENCOUNTER — Telehealth: Payer: Medicare Other

## 2021-12-22 DIAGNOSIS — L218 Other seborrheic dermatitis: Secondary | ICD-10-CM | POA: Diagnosis not present

## 2021-12-22 DIAGNOSIS — L298 Other pruritus: Secondary | ICD-10-CM | POA: Diagnosis not present

## 2021-12-22 NOTE — Telephone Encounter (Signed)
  Chronic Care Management   Outreach Note  12/22/2021 Name: MADISEN LUDVIGSEN MRN: [id number] DOB: 1950-12-12  Referred by: [name], NP  Patient had a phone appointment scheduled with clinical pharmacist today.  An unsuccessful telephone outreach was attempted today. The patient was referred to the pharmacist for assistance with medications, care management and care coordination.   Patient will NOT be penalized in any way for missing a CCM appointment. The no-show fee does not apply.  If possible, a message was left to return call to: [phone] or to [facility].  This is the 3rd time the patient has been rescheduled for CCM f/u appt and has been unable to be reached. Given inability to speak with patient, we will transition patient to [place] and she can always reach out to [facility] team if needed.   Charlene Brooke, PharmD, BCACP Clinical Pharmacist [facility] at [facility] [phone]

## 2021-12-22 NOTE — Progress Notes (Deleted)
Chronic Care Management Pharmacy Note  12/22/2021 Name:  Katherine [name] MRN:  [id number] DOB:  1950-11-01  Summary: CCM F/U visit -Pt would like to pursue iron infusion again -Pt started taking simvastatin 20 mg again (was previously off for several months). Added to med list. -2019 DEXA scan/FRAX score indicate patient was a candidate for pharmacologic treatment of osteopenia. She has repeat DEXA 06/2021 -Pt is taking Aleve daily for back pain  Recommendations/Changes made from today's visit: -Coordinate with PCP for iron infusion -Consider Prolia pending DEXA results 06/2021 -Advised to use Tylenol instead of Aleve on chronic basis  Plan: -[facility] will call patient in 1 month to ensure mammogram and DEXA scan occur -Pharmacist follow up televisit scheduled for in 2 months   Subjective: Katherine [name] is an 71 y.o. year old female who is a primary patient of [name], NP.  The CCM team was consulted for assistance with disease management and care coordination needs.    Engaged with patient by telephone for follow up visit in response to provider referral for pharmacy case management and/or care coordination services.   Consent to Services:  The patient was given information about Chronic Care Management services, agreed to services, and gave verbal consent prior to initiation of services.  Please see initial visit note for detailed documentation.   Patient Care Team: [name], NP as PCP - General (Nurse Practitioner) Minna Merritts, MD as PCP - Cardiology (Cardiology) Minna Merritts, MD as Consulting Physician (Cardiology) Festus Aloe, MD as Consulting Physician (Urology) [name], NP as Nurse Practitioner Charlton Haws, [facility] as Pharmacist (Pharmacist)  Patient lives at home with her husband. 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   Recent office visits: 12/08/21 NP [name] OV: f/u; scalp scabbing appears  self-inflicted, stop clobetasol, vinegar washes. Advised to stop picking at head to allow for healing. F/u with psych.  11/18/21 NP Tabitha Dugal OV: atopic dermatitis - rec head and shoulders or selsum blue shampoo. F/u with neurology regarding hallucinations.  10/14/21 NP Tabitha Dugal OV: c/o dysuria. Advised f/u with urology.   09/30/21 Dr Glori Bickers OV: c/o dysuria. Rx'd Keflex.   08/27/21 Alma Friendly NP OV: c/o dysuria. Urine culture and chlamydia/gonorrhea negative.  06/04/21-PCP-Katherine Clark,NP-Patient presented for dysuria and fatigue,labs ordered(Kidneys, electrolytes look good.Iron levels are low again.   We could do another iron infusion if she'd like. Rx'd bactrim for UTI.  Recent consult visits: 10/26/21 Genetic testing for dementia - will pursue 09/24/21 Dr Revonda Standard (Neurology - [facility]): f/u Memory Loss, Parkinsonism.. Probably Lewy Body dementia. Retry carbidopa-levodopa with slow titration.   08/12/21 Dr [facility] Auer [facility] f/u hallucinations, cognitive impairment. Advised to f/u with psych to change diazepam, possible topiramate d/t negative impact on cognition.   05/11/21-[facility]-Philip Lamptey,MD-Patient presented for dysuria.UA,xrays, Acute Cystitis-start Keflex 543m twice daily for 5 days,increase fluid intake. 02/27/21-[facility]-Kristin Boddu FNP-Patient presented for dysuria.UA ordered,start Cephalexin 5089m1 capsule twice daily for 7 days 02/22/21-[facility]-Kimberly Alves,NP-Patient presented for Covid-19 test  01/22/21-[facility]-Ian Lee,MD- f/u Lewy body dementia w/ delusions. continue Nuplazid 3442m capsule daily. D/W PCP, psychiatrist if antipsychotics (haloperidol, olanzapine) would be an option for controlling severe delusions, would likely make Parkinsonism worse.  12/24/20- LisNoemi Chapelherapy session- no data found 12/11/20-Urology- MatFestus Aloeo data found  [facility]: [facility] [place] Reassuring workup. D/C home to resume home  meds.   Objective:  Lab Results  Component Value Date   CREATININE 0.82 12/02/2021   BUN 14 12/02/2021   GFR 63.47 06/04/2021   GFRNONAA >60 12/02/2021   GFRAA 90 03/03/2018   NA 140 12/02/2021   K 3.6 12/02/2021   CALCIUM 8.8 (L) 12/02/2021   CO2 22 12/02/2021   GLUCOSE 143 (H) 12/02/2021    Lab Results  Component Value Date/Time   HGBA1C 6.2 03/10/2021 08:45 AM   HGBA1C 6.3 04/15/2020 12:41 PM   GFR 63.47 06/04/2021 03:48 PM   GFR 68.26 08/14/2020 12:43 PM    Last diabetic Eye exam: No results found for: HMDIABEYEEXA  Last diabetic Foot exam: No results found for: HMDIABFOOTEX   Lab Results  Component Value Date   CHOL 211 (H) 03/10/2021   HDL 57.40 03/10/2021   LDLCALC 60 04/15/2020   LDLDIRECT 143.0 03/10/2021   TRIG 266.0 (H) 03/10/2021   CHOLHDL 4 03/10/2021       Latest Ref Rng & Units 11/11/2020   11:58 AM 04/15/2020   12:41 PM 07/09/2019    7:43 AM  Hepatic Function  Total Protein 6.5 - 8.1 g/dL 7.1   6.6   7.0    Albumin 3.5 - 5.0 g/dL 4.2   3.9   4.1    AST 15 - 41 U/L _0 ALT 0 - 44 U/L _1 Alk Phosphatase 38 - 126 U/L 110   98   115    Total Bilirubin 0.3 - 1.2 mg/dL 0.6   0.5   0.3      Lab Results  Component Value Date/Time   TSH 1.180 12/02/2021 12:05 PM   TSH 3.33 10/07/2020 08:50 AM   TSH 1.93 03/13/2018 04:15 PM       Latest Ref Rng & Units 12/02/2021   12:05 PM 06/04/2021    3:48 PM 03/10/2021    8:45 AM  CBC  WBC 4.0 - 10.5 K/uL 9.0   10.1   9.6    Hemoglobin 12.0 - 15.0 g/dL 14.0   11.8   12.1    Hematocrit 36.0 - 46.0 % 45.8   38.2   38.4    Platelets 150 - 400 K/uL 345   350.0   324.0      Lab Results  Component Value Date/Time   VD25OH 21.57 (L) 03/10/2021 08:45 AM   VD25OH 38.63 04/15/2020 12:41 PM    Clinical ASCVD: No  The 10-year ASCVD risk score (Arnett DK, et al., 2019) is: 13.8%   Values used to  calculate the score:     Age: 63 years     Sex: Female     Is Non-Hispanic African American: No     Diabetic: No     Tobacco smoker: No     Systolic Blood Pressure: 937 mmHg     Is BP treated: Yes     HDL Cholesterol: 57.4 mg/dL     Total Cholesterol: 211 mg/dL       02/24/2021    4:15 PM 04/15/2020   11:45 AM 07/09/2019   12:16 PM  Depression screen PHQ 2/9  Decreased Interest 0 0 3  Down, Depressed, Hopeless _2 PHQ - 2 Score _3 Altered sleeping 0 0 3  Tired, decreased energy 3 3 0  Change in appetite 3 3 0  Feeling bad or failure about yourself  3 3 0  Trouble concentrating 0 0 0  Moving slowly or fidgety/restless 0 0 0  Suicidal thoughts  0 0  PHQ-9 Score _0 Difficult doing work/chores  Not difficult at all Somewhat difficult     Social History   Tobacco Use  Smoking Status Never  Smokeless Tobacco Never   BP Readings from Last 3 Encounters:  12/08/21 134/62  12/02/21 118/72  11/18/21 130/80   Pulse Readings from Last 3 Encounters:  12/08/21 97  12/02/21 99  11/18/21 (!) 114   Wt Readings from Last 3 Encounters:  12/08/21 183 lb (83 kg)  12/02/21 182 lb 5.1 oz (82.7 kg)  11/18/21 182 lb 8 oz (82.8 kg)   BMI Readings from Last 3 Encounters:  12/08/21 32.42 kg/m  12/02/21 32.30 kg/m  11/18/21 32.33 kg/m    Assessment/Interventions: Review of patient past medical history, allergies, medications, health status, including review of consultants reports, laboratory and other test data, was performed as part of comprehensive evaluation and provision of chronic care management services.   SDOH:  (Social Determinants of Health) assessments and interventions performed: Yes  SDOH Screenings   Alcohol Screen: Not on file  Depression (PHQ2-9): Medium Risk   PHQ-2 Score: 10  Financial Resource Strain: Not on file  Food Insecurity: Not on file  Housing: Not on file  Physical Activity: Not on file  Social Connections: Not on file  Stress: Not on  file  Tobacco Use: Low Risk    Smoking Tobacco Use: Never   Smokeless Tobacco Use: Never   Passive Exposure: Not on file  Transportation Needs: Not on file    CCM Care Plan  Allergies  Allergen Reactions   Chlorine Other (See Comments)    Smell causes lightheadedness.   Dilaudid [Hydromorphone]     hallucinations   Codeine Anxiety   Darvon Anxiety   Hydrocodone Anxiety   Meperidine Anxiety   Oxycodone Anxiety   Tramadol Anxiety   Victoza [Liraglutide] Nausea Only and Other (See Comments)    And weakness    Medications Reviewed Today     Reviewed by Francella Solian, CMA (Certified Medical Assistant) on 12/08/21 at 1217  Med List Status: <None>   Medication Order Taking? Sig Documenting Provider Last Dose Status Informant  acetaminophen (TYLENOL) 325 MG tablet [id number] Yes Take 650 mg by mouth every 6 (six) hours as needed. [provider] Taking Active   buPROPion (WELLBUTRIN XL) 150 MG 24 hr tablet [id number] Yes Take 1 tablet by mouth every morning. [provider] Taking Active   clobetasol (TEMOVATE) 0.05 % external solution [id number] Yes Apply 1 application. topically 2 (two) times daily. Eugenia Pancoast, FNP Taking Active   diazepam (VALIUM) 10 MG tablet [id number] Yes Take 10 mg by mouth at bedtime. [provider] Taking Active   DULoxetine (CYMBALTA) 30 MG capsule [id number] Yes Take 30 mg by mouth daily. (Total dose = 150 mg) [provider] Taking Active   DULoxetine (CYMBALTA) 60 MG capsule [id number] Yes Take 120 mg by mouth every morning. (Total dose = 150 mg) [provider] Taking Active   famotidine (PEPCID) 20 MG tablet [id number] Yes TAKE 1 TABLET (20 MG TOTAL) BY MOUTH DAILY. FOR HEARTBURN. [name], NP Taking Active   gabapentin (NEURONTIN) 300 MG capsule [id number] Yes TAKE 1 CAPSULE (300 MG TOTAL) BY MOUTH AT BEDTIME. FOR BACK PAIN.  Patient taking differently: Take 300 mg by mouth at bedtime. For back  pain PRN  [name], NP Taking Active   Multiple Vitamin (MULTIVITAMIN) tablet [id number] Yes Take 1 tablet by mouth daily. [provider] Taking Active   NUPLAZID 34 MG CAPS [id number] Yes Take 1 capsule by mouth daily. [provider] Taking Active   OVER THE COUNTER MEDICATION [id number] Yes [facility] vitamin [provider] Taking Active   OVER THE [place] [id number] Yes 2 tablets daily. [employer], Historical, MD Taking Active   pantoprazole (PROTONIX) 40 MG tablet [id number] Yes Take 1 tablet (40 mg total) by mouth daily. For heartburn. [name], NP Taking Active   simvastatin (ZOCOR) 20 MG tablet [id number] Yes Take 20 mg by mouth daily. Minna Merritts, MD Taking Active Self  spironolactone (ALDACTONE) 25 MG tablet [id number] Yes Take 1 tablet (25 mg total) by mouth daily. Minna Merritts, MD Taking Active   SUMAtriptan (IMITREX) 100 MG tablet [id number]  Take 100 mg by mouth as needed. [provider]  Expired 08/27/21 2359   tamsulosin (FLOMAX) 0.4 MG CAPS capsule [id number] Yes Take 0.4 mg by mouth daily. [provider] Taking Active   topiramate (TOPAMAX) 50 MG tablet [id number] Yes Take 1 tablet (50 mg total) by mouth daily. Eugenia Pancoast, FNP Taking Active             Patient Active Problem List   Diagnosis Date Noted   Frontotemporal dementia ([place]) 12/08/2021   Scalp irritation 12/08/2021   Near syncope 12/08/2021   Visual hallucination 11/18/2021   Vaginal discharge 08/27/2021   Vitamin B 12 deficiency 03/10/2021   Recurrent falls 03/10/2021   Pelvic fullness in female 10/07/2020   Lewy body dementia ([place]) 04/15/2020   Urinary incontinence 04/15/2020   Recurrent cystitis 02/13/2020   Parkinson's disease ([place]) 07/11/2019   Leukocytosis 07/11/2019   Osteopenia 07/07/2018   Gastroesophageal reflux disease 03/13/2018   Chronic migraine without aura without status migrainosus, not  intractable 03/13/2018   Constipation 02/15/2017   Spondylolisthesis of lumbar region 07/15/2016   Vitamin D deficiency 03/11/2016   Lumbar stenosis with neurogenic claudication 01/12/2016   Notalgia 01/08/2016   Restless leg syndrome 03/13/2015   Preventative health care 12/10/2014   Other fatigue 11/04/2014   Anemia 11/04/2014   Depression 10/31/2014   Prediabetes 10/31/2014   Obstructive sleep apnea 05/01/2009   Hyperlipidemia 09/04/2008   Obesity 09/04/2008   HYPERTENSION, BENIGN 09/04/2008    Immunization History  Administered Date(s) Administered   Fluad Quad(high Dose 65+) 05/16/2019, 04/15/2020   Influenza, High Dose Seasonal PF 04/21/2021   Influenza,inj,Quad PF,6+ Mos 05/09/2015, 05/07/2016, 03/30/2017, 05/10/2018   Influenza-Unspecified 05/01/2014   Moderna Covid-19 Vaccine Bivalent Booster 67yr & up 04/30/2021   Moderna Sars-Covid-2 Vaccination 08/29/2019, 09/26/2019   Pneumococcal Polysaccharide-23 12/10/2014, 07/06/2018, 04/30/2021   Td 12/10/2014   Tdap 02/08/2020   Zoster Recombinat (Shingrix) 02/27/2019, 06/12/2019   Zoster, Live 04/12/2009    Conditions to be addressed/monitored:  Hypertension, Hyperlipidemia, GERD, Depression, and Osteopenia, Dementia with behavioral disturbance  There are no care plans that you recently modified to display for this patient.     Medication Assistance:  Nuplazid obtained through medication assistance program.   Compliance/Adherence/Medication fill history: Care Gaps: Mammogram (due 06/01/20) - scheduled for Dec 2022  Star-Rating Drugs: Simvastatin - LF 11/19/20 x 90 ds, PDC 82%; pt was off of this for several months but restarted Oct 2022  Medication Access: Within the past 30 days, how often has patient missed a dose of medication? *** Is a pillbox or other  method used to improve adherence? Yes  - husband fills pill box weekly Factors that may affect medication adherence? {CHL DESC; BARRIERS:21522} Are meds  synced by current pharmacy? {YES/NO:21197} Are meds delivered by current pharmacy? {YES/NO:21197} Does patient experience delays in picking up medications due to transportation concerns? {YES/NO:21197}  [facility] Reviewed: Is patient disadvantaged to use [facility]?: {YES/NO:21197} Current Rx insurance plan: *** Name and location of [facility] pharmacy[facility]  [id number] [place]Phone: [phone]: [phone] [facility] services reviewed with patient today?: {YES/NO:21197} Patient requests to transfer care to [facility]?: {YES/NO:21197} Reason patient declined to change pharmacies: {US patient preference:[id number]}   Care Plan and Follow Up Patient Decision:  Patient agrees to Care Plan and Follow-up.  Plan: Telephone follow up appointment with care management team member scheduled for:  2 months  LCharlene Brooke PharmD, BCACP Clinical Pharmacist LMount [facility]Primary [facility] at [phone]   Current Barriers:  Unable to independently monitor therapeutic efficacy   Pharmacist Clinical Goal(s):  Patient will achieve adherence to monitoring guidelines and medication adherence to achieve therapeutic efficacy through collaboration with PharmD and provider.    Interventions: 1:1 collaboration with CPleas Koch NP regarding development and update of comprehensive plan of care as evidenced by provider attestation and co-signature Inter-disciplinary care team collaboration (see longitudinal plan of care) Comprehensive medication review performed; medication list updated in electronic medical record   Hypertension (BP goal <130/80) -Controlled - pt reports home BP is at goal; she checks regularly but has not been keeping a log -Current home readings: 120s/70s -Current treatment: Spironolactone 25 mg daily -Medications previously tried: carvedilol, chlorthalidone   -Denies hypotensive/hypertensive symptoms -Educated on BP goals and benefits of medications for prevention of heart attack, stroke and kidney damage; Importance of home blood pressure monitoring; -Counseled to monitor BP at home daily, document, and provide log at future appointments -Recommended to continue current medication   Hyperlipidemia: (LDL goal < 100) -Not ideally controlled - pt was previously off of simvastatin for several months, LDL had increased from 60 to 143; she reports she is now taking simvastatin again, she had plenty on hand so has not needed a refill yet -Current treatment: Simvastatin 20 mg daily -Medications previously tried: simvastatin  -Educated on Cholesterol goals; Benefits of statin for ASCVD risk reduction; -Added simvastatin back to med list -Recommended to continue current medication   Depression/Anxiety (Goal: manage symptoms) -Relatively Controlled -pt reports duloxetine is the only drug that has ever helped her depression; pt reports family hx of depression (mother and father, brother); she reports 353year hx of depression; she complains of dry mouth - Biotene, lozenges help some -trouble falling asleep, but sleeps well once she is asleep -PHQ9: 10 (02/2021) - moderate depression -GAD7: not on file -Connected with LNoemi Chapel NP for mental health support -Current treatment: Duloxetine 30 mg daily Duloxetine 60 mg - 2 tab daily Bupropion XL 150 mg daily AM Diazepam 10 mg HS -Medications previously tried/failed: aripiprazole, quetiapine, doxepin  -Educated on Benefits of medication for symptom control;  -Counseled dry mouth is likely due to high dose duloxetine; discussed benefits outweigh risks/side effects at this time -Recommended to continue current medication   Lewy Body dementia with behavioral disturbances (Goal: manage symptoms) -Relatively Controlled - pt reports Nuplazid has "done miracles" for her, but does think she is starting to build  a tolerance -Per neurology - Nuplazid does not appear to be  controlling delusions any longer, pt does not appear to have good insight; may consider atypical antipsychotics (haloperidol, olanzapine) although these would worsen parkinsonism -Current treatment  Nuplazid 34 mg daily HS (via PAP) -Medications previously tried: quetiapine, rivastigmine, levodopa  -Recommended to continue current medication    Osteopenia (Goal prevent fractures) -Not ideally controlled - pt is not taking calcium or vitamin D; she has DEXA scan scheduled next month -Last DEXA Scan: 06/01/18              T-Score femoral neck: -1.5             T-Score total hip: -1.4             T-Score lumbar spine: -0.2             10-year probability of major osteoporotic fracture: 28.1%             10-year probability of hip fracture: 3.2% -Patient is a candidate for pharmacologic treatment due to T-Score -1.0 to -2.5 and 10-year risk of major osteoporotic fracture > 20% and T-Score -1.0 to -2.5 and 10-year risk of hip fracture > 3% -Current treatment  None -Medications previously tried: alendronate (GI)  -Recommend [phone] units of vitamin D daily. Recommend 1200 mg of calcium daily from dietary and supplemental sources.  -Consider pharmacologic treatment based on DEXA results next month   Migraines (Goal: reduce migraine frequency) -Controlled - pt reports rare migraines since starting topiramate, she used to have them once a week -Current treatment  Topiramate 50 mg daily Sumatriptan 100 mg PRN - rare use -Recommended to continue current medication   Back pain (Goal: manage symptoms) -Controlled - pt reports overall back pain is improving; she using gabapentin PRN and Aleve daily; she is worried about Tylenol and liver issues because her father died from non-alcoholic liver disease -Hx 2 back surgeries 2018 -Current treatment  Gabapentin 300 mg HS - PRN Tylenol 325 mg PRN Aleve OTC Duloxetine 150 mg/day -Counseled  on risks of chronic NSAIDs (GI bleeding, hypertension, kidney damage);  -Educated on NALD; Tylenol liver risks; max daily dose of 3000 mg/day;  -Advised to use Tylenol daily and Aleve infrequently   Urinary voiding difficulty (Goal: manage symptoms) -Not ideally controlled - pt reports tamsulosin has helped with voiding difficulties but it is still a major issue; she is starting pelvic floor PT in December and hopeful that this will help -Current treatment  Tamsulosin 0.4 mg daily -Recommended to continue current medication   Iron deficiency anemia (Goal: manage symptoms) -Uncontrolled - pt reports she is not taking iron supplements, she had significant constipation with them int he past and she would like another iron infusion -Current treatment  None -Will coordinate with PCP to set up iron infusion   GERD (Goal: manage symptoms) -Controlled - pt reports dairy products trigger GERD; she takes PPI daily and has occasional breakthrough issue for which she takes famotidine -Current treatment  Pantoprazole 40 mg daily Famotidine 20 mg PRN -Counseled on long term PPI risks; pt is not interested in stopping PPI -Recommended to continue current medication   Health Maintenance -Vaccine gaps: Prevnar, Covid booster, Flu -Pt reports [facility] the [facility]. Will get  -Current therapy:  Multivitamin [employer] vitamin (hair/skin/nails) Fiber supplement -Patient is satisfied with current therapy and denies issues -Recommended to continue current medication   Patient Goals/Self-Care Activities Patient will:  - take medications as prescribed as evidenced by  patient report and record review focus on medication adherence by pill box check blood pressure daily, document, and provide at future appointments -Stop daily Aleve, take Tylenol instead (up to 3000 mg/day)

## 2021-12-25 ENCOUNTER — Ambulatory Visit: Payer: Medicare Other

## 2021-12-28 NOTE — Progress Notes (Signed)
Date:  12/29/2021   ID:  Katherine, [name] 10-04-1950, MRN [id number]  Patient Location:  [place] [id number]   Provider location:   [facility], [place] office  PCP:  [name], NP  Cardiologist:  Arvid Right [facility]  Chief Complaint  Patient presents with   6 month follow up     Patient c/o tachycardia at times. Medications reviewed by the patient verbally.     History of Present Illness:    Katherine [name] is a 71 y.o. female  past medical history of CP with no CAD on cath 2007,  HTN  Morbid obesity Chronic back pain, prior back surgery Possible sleep apnea by history Parkinson's , seen at [facility] Jan 2023, frontotemporal dementia (FTD) vs Lewy Body Dementia.  She returns today for routine follow-up Of her blood pressure, history of hypokalemia  Last seen in clinic by myself January 2022 Seen in the hospital Dec 02, 2021 with near syncope dizziness and a near-fall.  Patient also reports some nausea without vomiting following the incident had not eaten , skipped breakfast , was rushing through the airport to catch a flight that was ultimately delayed.  Patient began to experience some near syncopal episodes, was able to lower herself to the ground holding onto a chair.  She has had previous episodes of similar incidences  Not sleeping well On 1/2 valium Wakes at 4 Am  Off simvastatin Off coreg, Concerned about elevated heart rate, no symptoms "No trouble walking or climbing stairs" Gets dizzy getting out of bed in the AM No recent falls  Gait instability at baseline Bp at home 786 to 754 systolic Heart rate 90 to 100  Has Hallucinations at times Long discussion today, thinking of hiring a private detective to monitor her husband who she feels may be having an affair, debit card keeps going missing, money missing from her account, jewelry missing, feels someone is coming into her house and taking her  belongings On NUPLAZID  Followed by neurology  EKG personally reviewed by myself on todays visit Sinus tachycardia rate 105 no ST or T wave changes  Other past medical hx reviewed Echo   1. Left ventricular ejection fraction, by estimation, is 60 to 65%. The  left ventricle has normal function. The left ventricle has no regional  wall motion abnormalities. Left ventricular diastolic parameters are  consistent with Grade I diastolic  dysfunction (impaired relaxation).   2. Right ventricular systolic function is normal. The right ventricular  size is normal. There is normal pulmonary artery systolic pressure. The  estimated right ventricular systolic pressure is 49.2 mmHg.    Past Medical History:  Diagnosis Date   Anemia 2018   Anxiety    Arthritis    Borderline diabetes    Chest pain    a. 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 [facility]   Depression    Dyspnea on exertion    a. 03/2009 Echo: EF 65%, no rwma, triv TR.   GERD (gastroesophageal reflux disease)    High cholesterol    Hypertension    Hypokalemia    Migraine headache    Obesity    Parkinson's disease ([place])    Pre-diabetes    okay now  Past Surgical History:  Procedure Laterality Date   BLADDER SUSPENSION  2005   CARDIAC CATHETERIZATION     CESAREAN SECTION     CLOSED REDUCTION NASAL FRACTURE N/A 01/11/2020   Procedure: CLOSED REDUCTION NASAL FRACTURE;  Surgeon: Araiyah Gust, MD;  Location: [place];  Service: ENT;  Laterality: N/A;   ENDOMETRIAL ABLATION  2007   FOOT SURGERY     bilateral bunions   LUMBAR LAMINECTOMY/DECOMPRESSION MICRODISCECTOMY Bilateral 01/12/2016   Procedure: Bilateral L4-5 Laminotomy/Foraminotomy;  Surgeon: Newman Pies, MD;  Location: [facility];  Service: Neurosurgery;  Laterality: Bilateral;   Bilateral L4-5 Laminotomy/Foraminotomy   TONSILLECTOMY  1971     Allergies:   Chlorine, Dilaudid [hydromorphone], Codeine, Darvon, Hydrocodone, Meperidine, Oxycodone, Tramadol, and Victoza [liraglutide]   Social History   Tobacco Use   Smoking status: Never   Smokeless tobacco: Never  Vaping Use   Vaping Use: Never used  Substance Use Topics   Alcohol use: No   Drug use: No     Current Outpatient Medications on File Prior to Visit  Medication Sig Dispense Refill   acetaminophen (TYLENOL) 325 MG tablet Take 650 mg by mouth every 6 (six) hours as needed.     DULoxetine (CYMBALTA) 60 MG capsule Take 120 mg by mouth every morning. (Total dose = 150 mg)     famotidine (PEPCID) 20 MG tablet TAKE 1 TABLET (20 MG TOTAL) BY MOUTH DAILY. FOR HEARTBURN. 90 tablet 1   gabapentin (NEURONTIN) 300 MG capsule TAKE 1 CAPSULE (300 MG TOTAL) BY MOUTH AT BEDTIME. FOR BACK PAIN. (Patient taking differently: Take 300 mg by mouth at bedtime. For back pain PRN) 90 capsule 2   Multiple Vitamin (MULTIVITAMIN) tablet Take 1 tablet by mouth daily.     NUPLAZID 34 MG CAPS Take 1 capsule by mouth daily.     OVER THE COUNTER MEDICATION [employer] vitamin     OVER THE COUNTER MEDICATION 2 tablets daily. Air Shield     pantoprazole (PROTONIX) 40 MG tablet Take 1 tablet (40 mg total) by mouth daily. For heartburn. 90 tablet 3   spironolactone (ALDACTONE) 25 MG tablet Take 1 tablet (25 mg total) by mouth daily. 90 tablet 3   SUMAtriptan (IMITREX) 100 MG tablet Take 100 mg by mouth as needed.     tamsulosin (FLOMAX) 0.4 MG CAPS capsule Take 0.4 mg by mouth daily.     topiramate (TOPAMAX) 50 MG tablet Take 1 tablet (50 mg total) by mouth daily.     buPROPion (WELLBUTRIN XL) 150 MG 24 hr tablet Take 1 tablet by mouth every morning. (Patient not taking: Reported on 12/29/2021)     clobetasol (TEMOVATE) 0.05 % external solution Apply 1 application. topically 2 (two) times daily. (Patient not taking: Reported on 12/29/2021)  50 mL 0   diazepam (VALIUM) 10 MG tablet Take 10 mg by mouth at bedtime. (Patient not taking: Reported on 12/29/2021)  2   DULoxetine (CYMBALTA) 30 MG capsule Take 30 mg by mouth daily. (Total dose = 150 mg) (Patient not taking: Reported on 12/29/2021)     No current facility-administered medications on file prior to visit.     Family Hx: The patient's family history includes Breast cancer (age of onset: 7) in her sister; Cancer in her maternal grandfather and mother; Cancer (age of onset: 80) in her brother; Diabetes in her brother, father, mother, and sister; Esophageal cancer in her brother; Heart disease in her brother, father, and mother; Heart failure in her brother  and mother; Hypertension in her father and mother; Liver disease in her father. There is no history of Colon cancer, Stomach cancer, or Rectal cancer.  ROS:   Please see the history of present illness.    Review of Systems  Constitutional: Negative.   HENT: Negative.    Respiratory: Negative.    Cardiovascular: Negative.   Gastrointestinal: Negative.   Musculoskeletal: Negative.   Neurological:  Positive for dizziness.  Psychiatric/Behavioral: Negative.    All other systems reviewed and are negative.    Labs/Other Tests and Data Reviewed:    Recent Labs: 12/02/2021: BUN 14; Creatinine, Ser 0.82; Hemoglobin 14.0; Platelets 345; Potassium 3.6; Sodium 140; TSH 1.180   Recent Lipid Panel Lab Results  Component Value Date/Time   CHOL 211 (H) 03/10/2021 08:45 AM   CHOL 123 06/22/2013 08:09 AM   TRIG 266.0 (H) 03/10/2021 08:45 AM   HDL 57.40 03/10/2021 08:45 AM   HDL 48 06/22/2013 08:09 AM   CHOLHDL 4 03/10/2021 08:45 AM   LDLCALC 60 04/15/2020 12:41 PM   LDLCALC 60 06/22/2013 08:09 AM   LDLDIRECT 143.0 03/10/2021 08:45 AM    Wt Readings from Last 3 Encounters:  12/29/21 184 lb 8 oz (83.7 kg)  12/08/21 183 lb (83 kg)  12/02/21 182 lb 5.1 oz (82.7 kg)     Exam:    BP 122/72 (BP Location: Left Arm, Patient  Position: Sitting, Cuff Size: Normal)   Pulse (!) 105   Ht '5\' 2"'$  (1.575 m)   Wt 184 lb 8 oz (83.7 kg)   SpO2 97%   BMI 33.75 kg/m  Constitutional:  oriented to person, place, and time. No distress.  HENT:  Head: Grossly normal Eyes:  no discharge. No scleral icterus.  Neck: No JVD, no carotid bruits  Cardiovascular: Regular rate and rhythm, no murmurs appreciated Pulmonary/Chest: Clear to auscultation bilaterally, no wheezes or rails Abdominal: Soft.  no distension.  no tenderness.  Musculoskeletal: Normal range of motion Neurological:  normal muscle tone. Coordination normal. No atrophy Skin: Skin warm and dry Psychiatric: normal affect, pleasant  ASSESSMENT & PLAN:    Problem List Items Addressed This Visit       Cardiology Problems   Hyperlipidemia   Other Visit Diagnoses     Essential hypertension    -  Primary   Chest pain, unspecified type       Dyspnea, unspecified type       Sleep-disordered breathing       Paresthesia of lower extremity       Tachycardia          Dizziness Chronic issue, worse in the Am I am included not to treat elevated heart rate give near syncope epsiodes and dizziness in the AM, also has periodic low pressures on her home meaurements  Falls Denies any recent falls on new medication Dx with parkinsons, Followed by neurology Recommended regular exercise program  Tachycardia off carvedilol it would appear Give dizziness and fatigue, will not reast b-blocker Not having sx it would appear from elevated heart rate  Parkinson's Followed by neurology, seen at [facility] clinic On NUPLAZID , feels sx are stable   Total encounter time more than 30 minutes  Greater than 50% was spent in counseling and coordination of care with the patient    Signed, Ida Rogue, [place] [address], [place], [place] [address]

## 2021-12-29 ENCOUNTER — Ambulatory Visit (INDEPENDENT_AMBULATORY_CARE_PROVIDER_SITE_OTHER): Payer: Medicare Other | Admitting: Cardiovascular Disease

## 2021-12-29 ENCOUNTER — Encounter: Payer: Self-pay | Admitting: Cardiovascular Disease

## 2021-12-29 VITALS — BP 122/72 | HR 105 | Ht 62.0 in | Wt 184.5 lb

## 2021-12-29 DIAGNOSIS — R0600 Dyspnea, unspecified: Secondary | ICD-10-CM

## 2021-12-29 DIAGNOSIS — I10 Essential (primary) hypertension: Secondary | ICD-10-CM

## 2021-12-29 DIAGNOSIS — G4730 Sleep apnea, unspecified: Secondary | ICD-10-CM

## 2021-12-29 DIAGNOSIS — E782 Mixed hyperlipidemia: Secondary | ICD-10-CM

## 2021-12-29 DIAGNOSIS — R000 Tachycardia, unspecified: Secondary | ICD-10-CM | POA: Diagnosis not present

## 2021-12-29 DIAGNOSIS — R202 Paresthesia of skin: Secondary | ICD-10-CM | POA: Diagnosis not present

## 2021-12-29 DIAGNOSIS — R079 Chest pain, unspecified: Secondary | ICD-10-CM | POA: Diagnosis not present

## 2021-12-29 NOTE — Patient Instructions (Signed)
Medication Instructions:  No changes  If you need a refill on your cardiac medications before your next appointment, please call your pharmacy.   Lab work: No new labs needed  Testing/Procedures: No new testing needed  Follow-Up: At [facility], you and your health needs are our priority.  As part of our continuing mission to provide you with exceptional heart care, we have created designated Provider Care Teams.  These Care Teams include your primary Cardiologist (physician) and Advanced Practice Providers (APPs -  Physician Assistants and Nurse Practitioners) who all work together to provide you with the care you need, when you need it.  You will need a follow up appointment in 12 months  Providers on your designated Care Team:   Christopher Berge, NP Ryan Dunn, PA-C Cadence Furth, PA-C  COVID-19 Vaccine Information can be found at: [email or web address] For questions related to vaccine distribution or appointments, please email [email or web address] or call [phone].   

## 2021-12-31 ENCOUNTER — Ambulatory Visit: Payer: Medicare Other

## 2022-01-01 ENCOUNTER — Telehealth: Payer: Self-pay | Admitting: Primary Care

## 2022-01-01 DIAGNOSIS — K219 Gastro-esophageal reflux disease without esophagitis: Secondary | ICD-10-CM

## 2022-01-04 DIAGNOSIS — N3941 Urge incontinence: Secondary | ICD-10-CM | POA: Diagnosis not present

## 2022-01-06 NOTE — Telephone Encounter (Signed)
Pt called and wanted to know why this want filled, she said she is out and had a hard time sleeping last night because her reflux was really bad. Call back is [phone].

## 2022-01-06 NOTE — Telephone Encounter (Signed)
Patient should have refills right?

## 2022-01-07 ENCOUNTER — Other Ambulatory Visit: Payer: Self-pay | Admitting: Primary Care

## 2022-01-07 DIAGNOSIS — K219 Gastro-esophageal reflux disease without esophagitis: Secondary | ICD-10-CM

## 2022-01-07 NOTE — Telephone Encounter (Signed)
Left message to return call to our office.  

## 2022-01-07 NOTE — Telephone Encounter (Signed)
Patient called back. Let her know that she should have refill at pharmacy. She will call and if any issues let us know.

## 2022-01-07 NOTE — Telephone Encounter (Signed)
Patient should have refills through August.

## 2022-01-08 NOTE — Telephone Encounter (Signed)
Katherine [name] is calling in stating [facility] still does not have the medication.

## 2022-01-11 DIAGNOSIS — F331 Major depressive disorder, recurrent, moderate: Secondary | ICD-10-CM | POA: Diagnosis not present

## 2022-01-12 ENCOUNTER — Telehealth: Payer: Self-pay | Admitting: Primary Care

## 2022-01-16 ENCOUNTER — Other Ambulatory Visit: Payer: Self-pay | Admitting: Primary Care

## 2022-01-16 DIAGNOSIS — M4316 Spondylolisthesis, lumbar region: Secondary | ICD-10-CM

## 2022-01-21 ENCOUNTER — Ambulatory Visit: Payer: Medicare Other | Admitting: Primary Care

## 2022-01-21 ENCOUNTER — Ambulatory Visit: Payer: Medicare Other

## 2022-01-21 ENCOUNTER — Telehealth: Payer: Self-pay

## 2022-01-21 NOTE — Telephone Encounter (Signed)
Katherine [name] from [facility] is calling in asking for a delay of care orders for Physical Therapy starting tomorrow.

## 2022-01-21 NOTE — Telephone Encounter (Signed)
Approved.  

## 2022-01-22 NOTE — Telephone Encounter (Signed)
Called and spoke to [place]. Ok given.

## 2022-02-03 ENCOUNTER — Telehealth: Payer: Self-pay | Admitting: Primary Care

## 2022-02-03 NOTE — Telephone Encounter (Signed)
LVM for pt to rtn my call to schedule AWV with [facility] call back # [phone] 

## 2022-02-04 ENCOUNTER — Ambulatory Visit: Payer: Medicare Other

## 2022-02-05 ENCOUNTER — Ambulatory Visit (INDEPENDENT_AMBULATORY_CARE_PROVIDER_SITE_OTHER): Payer: Medicare Other | Admitting: Primary Care

## 2022-02-05 ENCOUNTER — Encounter: Payer: Self-pay | Admitting: Primary Care

## 2022-02-05 ENCOUNTER — Other Ambulatory Visit (INDEPENDENT_AMBULATORY_CARE_PROVIDER_SITE_OTHER): Payer: Medicare Other

## 2022-02-05 ENCOUNTER — Telehealth: Payer: Self-pay | Admitting: Radiology

## 2022-02-05 VITALS — BP 160/74 | HR 134 | Temp 98.6°F | Ht 62.0 in | Wt 188.0 lb

## 2022-02-05 DIAGNOSIS — R441 Visual hallucinations: Secondary | ICD-10-CM

## 2022-02-05 DIAGNOSIS — R7303 Prediabetes: Secondary | ICD-10-CM

## 2022-02-05 DIAGNOSIS — F0280 Dementia in other diseases classified elsewhere without behavioral disturbance: Secondary | ICD-10-CM | POA: Diagnosis not present

## 2022-02-05 DIAGNOSIS — N390 Urinary tract infection, site not specified: Secondary | ICD-10-CM

## 2022-02-05 DIAGNOSIS — D72829 Elevated white blood cell count, unspecified: Secondary | ICD-10-CM

## 2022-02-05 DIAGNOSIS — R296 Repeated falls: Secondary | ICD-10-CM

## 2022-02-05 DIAGNOSIS — G3109 Other frontotemporal dementia: Secondary | ICD-10-CM | POA: Diagnosis not present

## 2022-02-05 DIAGNOSIS — D509 Iron deficiency anemia, unspecified: Secondary | ICD-10-CM

## 2022-02-05 DIAGNOSIS — Z87440 Personal history of urinary (tract) infections: Secondary | ICD-10-CM

## 2022-02-05 DIAGNOSIS — G20 Parkinson's disease: Secondary | ICD-10-CM

## 2022-02-05 DIAGNOSIS — E538 Deficiency of other specified B group vitamins: Secondary | ICD-10-CM

## 2022-02-05 DIAGNOSIS — F32A Depression, unspecified: Secondary | ICD-10-CM | POA: Diagnosis not present

## 2022-02-05 LAB — WHITE CELL DIFFERENTIAL
Band Neutrophils: 0 %
Basophils Relative: 0.3 % (ref 0.0–3.0)
Eosinophils Relative: 0.7 % (ref 0.0–5.0)
Lymphocytes Relative: 4.7 % — ABNORMAL LOW (ref 12.0–46.0)
Monocytes Relative: 6.4 % (ref 3.0–12.0)
Neutrophils Relative %: 87.9 % — ABNORMAL HIGH (ref 43.0–77.0)

## 2022-02-05 LAB — CBC
HCT: 45 % (ref 36.0–46.0)
Hemoglobin: 14.2 g/dL (ref 12.0–15.0)
MCHC: 31.6 g/dL (ref 30.0–36.0)
MCV: 93 fl (ref 78.0–100.0)
Platelets: 280 10*3/uL (ref 150.0–400.0)
RBC: 4.84 Mil/uL (ref 3.87–5.11)
RDW: 13.9 % (ref 11.5–15.5)
WBC: 22.6 10*3/uL (ref 4.0–10.5)

## 2022-02-05 LAB — POC URINALSYSI DIPSTICK (AUTOMATED)
Bilirubin, UA: NEGATIVE
Blood, UA: NEGATIVE
Glucose, UA: NEGATIVE
Ketones, UA: NEGATIVE
Leukocytes, UA: NEGATIVE
Nitrite, UA: NEGATIVE
Protein, UA: NEGATIVE
Spec Grav, UA: 1.01 (ref 1.010–1.025)
Urobilinogen, UA: 0.2 E.U./dL
pH, UA: 6 (ref 5.0–8.0)

## 2022-02-05 LAB — BASIC METABOLIC PANEL
BUN: 14 mg/dL (ref 6–23)
CO2: 23 mEq/L (ref 19–32)
Calcium: 9.2 mg/dL (ref 8.4–10.5)
Chloride: 109 mEq/L (ref 96–112)
Creatinine, Ser: 0.79 mg/dL (ref 0.40–1.20)
GFR: 75.84 mL/min (ref >60.00)
Glucose, Bld: 167 mg/dL — ABNORMAL HIGH (ref 70–99)
Potassium: 3.7 mEq/L (ref 3.5–5.1)
Sodium: 142 mEq/L (ref 135–145)

## 2022-02-05 LAB — VITAMIN B12: Vitamin B-12: 265 pg/mL (ref 211–911)

## 2022-02-05 LAB — HEMOGLOBIN A1C: Hgb A1c MFr Bld: 6 % (ref 4.6–6.5)

## 2022-02-05 LAB — IBC + FERRITIN
Ferritin: 32.2 ng/mL (ref 10.0–291.0)
Iron: 16 ug/dL — ABNORMAL LOW (ref 42–145)
Saturation Ratios: 3.8 % — ABNORMAL LOW (ref 20.0–50.0)
TIBC: 418.6 ug/dL (ref 250.0–450.0)
Transferrin: 299 mg/dL (ref 212.0–360.0)

## 2022-02-05 NOTE — Assessment & Plan Note (Signed)
Deteriorated with recent intentional overdose.  Following with psychiatry, has an appointment scheduled for next week.  We had a very frank conversation today about safety and the harmful effects of overdosing on pills.  Her husband will closely watch pill bottles.  The patient contracts to safety today and has no intentions of harming herself.  She will work closely with her psychiatrist regarding this recent incident.  Continue duloxetine 120 mg daily.

## 2022-02-05 NOTE — Telephone Encounter (Signed)
Please call patient, we need her to come by and provide a urine sample with culture. I will order both.

## 2022-02-05 NOTE — Assessment & Plan Note (Signed)
Repeat A1c pending, especially in light of her poor diet.  Discussed to reduce consumption of sugary drinks and carbs, increase vegetables and lean protein.

## 2022-02-05 NOTE — Assessment & Plan Note (Signed)
Appears to be deteriorating, hallucinations continue, now with SI and intentional overdose.   We had a very frank conversation today about safety and the harmful effects of overdosing on pills.  Her husband will closely watch pill bottles.  The patient contracts to safety today and has no intentions of harming herself.  She will work closely with her psychiatrist regarding this recent incident.  Strongly advised they follow up with neurology and psychiatry as scheduled. Continue Nuplazid 34 mg daily.

## 2022-02-05 NOTE — Telephone Encounter (Signed)
[facility] called a critical result, WBC - 22,000. Results given top [name]

## 2022-02-05 NOTE — Assessment & Plan Note (Addendum)
Imbalance symptoms have returned.  We discussed safety in the home.  Consider home health physical therapy if warranted. Husband will update.
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
## 2022-02-05 NOTE — Telephone Encounter (Signed)
Called patient husband. He will bring her by this afternoon to get urine.

## 2022-02-05 NOTE — Assessment & Plan Note (Signed)
Repeat iron studies and CBC pending.  

## 2022-02-05 NOTE — Assessment & Plan Note (Signed)
Likely secondary to Parkinson's disease, however will check labs to rule out other cause.  CBC and iron studies pending.

## 2022-02-05 NOTE — Telephone Encounter (Signed)
Have called all numbers in patient chart. Left messages on all and sent message in my chart. To call office as soon as possible.

## 2022-02-05 NOTE — Assessment & Plan Note (Signed)
Continued and appear to have increased.  Follow-up with neurology and psychiatry as scheduled.

## 2022-02-05 NOTE — Patient Instructions (Signed)
Stop by the lab prior to leaving today. I will notify you of your results once received.   It was a pleasure to see you today!  

## 2022-02-06 DIAGNOSIS — N3000 Acute cystitis without hematuria: Secondary | ICD-10-CM

## 2022-02-06 DIAGNOSIS — D509 Iron deficiency anemia, unspecified: Secondary | ICD-10-CM

## 2022-02-06 DIAGNOSIS — E538 Deficiency of other specified B group vitamins: Secondary | ICD-10-CM

## 2022-02-07 ENCOUNTER — Other Ambulatory Visit: Payer: Self-pay | Admitting: Primary Care

## 2022-02-07 DIAGNOSIS — N3000 Acute cystitis without hematuria: Secondary | ICD-10-CM

## 2022-02-07 LAB — URINE CULTURE
MICRO NUMBER:: 13678828
SPECIMEN QUALITY:: ADEQUATE

## 2022-02-07 MED ORDER — SULFAMETHOXAZOLE-TRIMETHOPRIM 800-160 MG PO TABS: 1.0000 | ORAL_TABLET | Freq: Two times a day (BID) | ORAL | 0 refills | Status: DC

## 2022-02-09 ENCOUNTER — Other Ambulatory Visit: Payer: Self-pay | Admitting: Primary Care

## 2022-02-11 ENCOUNTER — Ambulatory Visit: Payer: Medicare Other | Admitting: Primary Care

## 2022-02-12 ENCOUNTER — Other Ambulatory Visit: Payer: Medicare Other

## 2022-02-12 ENCOUNTER — Other Ambulatory Visit (INDEPENDENT_AMBULATORY_CARE_PROVIDER_SITE_OTHER): Payer: Medicare Other

## 2022-02-12 DIAGNOSIS — N3000 Acute cystitis without hematuria: Secondary | ICD-10-CM

## 2022-02-12 LAB — URINALYSIS, ROUTINE W REFLEX MICROSCOPIC
Bilirubin Urine: NEGATIVE
Hgb urine dipstick: NEGATIVE
Ketones, ur: NEGATIVE
Leukocytes,Ua: NEGATIVE
Nitrite: NEGATIVE
Specific Gravity, Urine: >=1.03 — AB (ref 1.000–1.030)
Total Protein, Urine: NEGATIVE
Urine Glucose: NEGATIVE
Urobilinogen, UA: 1 (ref 0.0–1.0)
pH: 6 (ref 5.0–8.0)

## 2022-02-12 LAB — CBC WITH DIFFERENTIAL/PLATELET
Basophils Absolute: 0.1 10*3/uL (ref 0.0–0.1)
Basophils Relative: 0.7 % (ref 0.0–3.0)
Eosinophils Absolute: 0.7 10*3/uL (ref 0.0–0.7)
Eosinophils Relative: 7.4 % — ABNORMAL HIGH (ref 0.0–5.0)
HCT: 43.6 % (ref 36.0–46.0)
Hemoglobin: 13.9 g/dL (ref 12.0–15.0)
Lymphocytes Relative: 18.9 % (ref 12.0–46.0)
Lymphs Abs: 1.8 10*3/uL (ref 0.7–4.0)
MCHC: 31.9 g/dL (ref 30.0–36.0)
MCV: 91.3 fl (ref 78.0–100.0)
Monocytes Absolute: 0.8 10*3/uL (ref 0.1–1.0)
Monocytes Relative: 8.4 % (ref 3.0–12.0)
Neutro Abs: 6.1 10*3/uL (ref 1.4–7.7)
Neutrophils Relative %: 64.6 % (ref 43.0–77.0)
Platelets: 284 10*3/uL (ref 150.0–400.0)
RBC: 4.77 Mil/uL (ref 3.87–5.11)
RDW: 13.9 % (ref 11.5–15.5)
WBC: 9.4 10*3/uL (ref 4.0–10.5)

## 2022-02-13 LAB — URINE CULTURE
MICRO NUMBER:: 13708446
SPECIMEN QUALITY:: ADEQUATE

## 2022-02-16 NOTE — Telephone Encounter (Signed)
Patient reviewed result note on my chart.

## 2022-02-17 ENCOUNTER — Inpatient Hospital Stay: Payer: Medicare Other

## 2022-02-17 ENCOUNTER — Inpatient Hospital Stay: Payer: Medicare Other | Admitting: Internal Medicine

## 2022-02-17 NOTE — Telephone Encounter (Signed)
Joellen, for some reason I'm not able to respond to her. Can you check into this?

## 2022-02-19 NOTE — Telephone Encounter (Signed)
Patient has deactivated my chart. We are not able to send any messages at this time.

## 2022-02-22 ENCOUNTER — Ambulatory Visit: Payer: Medicare Other

## 2022-02-24 ENCOUNTER — Telehealth: Payer: Self-pay | Admitting: Internal Medicine

## 2022-02-24 ENCOUNTER — Inpatient Hospital Stay: Payer: Medicare Other | Admitting: Internal Medicine

## 2022-02-24 ENCOUNTER — Inpatient Hospital Stay: Payer: Medicare Other

## 2022-02-24 NOTE — Telephone Encounter (Signed)
Good morning, patients husband called to reschedule this new patient appointment today. He stated his wife is not feeling well.  When can we get her rescheduled?

## 2022-03-03 ENCOUNTER — Ambulatory Visit: Payer: Medicare Other | Admitting: Physician Assistant

## 2022-03-08 ENCOUNTER — Inpatient Hospital Stay: Payer: Medicare Other | Admitting: Internal Medicine

## 2022-03-08 ENCOUNTER — Inpatient Hospital Stay: Payer: Medicare Other

## 2022-03-12 ENCOUNTER — Encounter: Payer: Self-pay | Admitting: Physician Assistant

## 2022-03-12 ENCOUNTER — Telehealth: Payer: Self-pay | Admitting: Cardiovascular Disease

## 2022-03-12 ENCOUNTER — Ambulatory Visit (INDEPENDENT_AMBULATORY_CARE_PROVIDER_SITE_OTHER): Payer: Medicare Other | Admitting: Physician Assistant

## 2022-03-12 VITALS — BP 118/70 | HR 45 | Ht 62.0 in | Wt 187.0 lb

## 2022-03-12 DIAGNOSIS — K219 Gastro-esophageal reflux disease without esophagitis: Secondary | ICD-10-CM | POA: Diagnosis not present

## 2022-03-12 DIAGNOSIS — Z8719 Personal history of other diseases of the digestive system: Secondary | ICD-10-CM

## 2022-03-12 DIAGNOSIS — Z86010 Personal history of colonic polyps: Secondary | ICD-10-CM

## 2022-03-12 DIAGNOSIS — R1310 Dysphagia, unspecified: Secondary | ICD-10-CM | POA: Diagnosis not present

## 2022-03-12 DIAGNOSIS — R1319 Other dysphagia: Secondary | ICD-10-CM

## 2022-03-12 MED ORDER — PANTOPRAZOLE SODIUM 40 MG PO TBEC: 40.0000 mg | DELAYED_RELEASE_TABLET | Freq: Two times a day (BID) | ORAL | 4 refills | Status: DC

## 2022-03-12 NOTE — Progress Notes (Signed)
Assessment and plan noted ?

## 2022-03-12 NOTE — Progress Notes (Signed)
Subjective:    Patient ID: Katherine [name], female    DOB: April 12, 1951, 71 y.o.   MRN: [id number]  HPI Katherine is a pleasant 71 year old female, established with Dr. Henrene Pastor who comes in today with complaints of 3 to 50-monthhistory of gradually progressive dysphagia and odynophagia. She says that she has dysphagia with pills and all solid foods and has been avoiding meat and heavier foods recently because of symptoms.  No difficulty with liquids.  She describes a sensation of her food getting stuck consistently as do her pills and then having a burning discomfort in her chest associated.  She is having frequent episodes of regurgitation of food.  She says if she does not regurgitate then sometimes it will take hours for her food to traverse and be uncomfortable. She does have history of prior esophageal stricture and had EGD in March 2021 which showed a moderate sliding hiatal hernia and a benign distal stricture at that time 1.4 cm which was balloon dilated. Last colonoscopy October 2018 with finding of a 9 mm polyp in the ascending colon which was a tubular adenoma and noted multiple diverticuli.  She had indication for 5-year interval follow-up. Other medical issues include hypertension, migraine headaches, restless leg syndrome, sleep apnea, and history of Parkinson's disease/Lewy body dementia.  She has been on Protonix 40 mg p.o. every afternoon and Pepcid 20 mg p.o. every morning. No regular aspirin or NSAIDs.  No current complaints of changes in bowel habits, no melena or hematochezia.  Review of Systems Pertinent positive and negative review of systems were noted in the above HPI section.  All other review of systems was otherwise negative.   Outpatient Encounter Medications as of 03/12/2022  Medication Sig   clobetasol (TEMOVATE) 0.05 % external solution Apply 1 application. topically 2 (two) times daily.   DULoxetine (CYMBALTA) 30 MG capsule Take 30 mg by mouth daily.   DULoxetine  (CYMBALTA) 60 MG capsule Take 60 mg by mouth every morning. Takes with her '30mg'$  capsule to equal 90 mg daily   famotidine (PEPCID) 20 MG tablet TAKE 1 TABLET (20 MG TOTAL) BY MOUTH DAILY. FOR HEARTBURN.   gabapentin (NEURONTIN) 300 MG capsule TAKE 1 CAPSULE (300 MG TOTAL) BY MOUTH AT BEDTIME. FOR BACK PAIN.   Multiple Vitamin (MULTIVITAMIN) tablet Take 1 tablet by mouth daily.   NUPLAZID 34 MG CAPS Take 1 capsule by mouth daily.   OVER THE COUNTER MEDICATION [employer] vitamin   OVER THE COUNTER MEDICATION 2 tablets daily. Air Shield   spironolactone (ALDACTONE) 25 MG tablet Take 1 tablet (25 mg total) by mouth daily.   sulfamethoxazole-trimethoprim (BACTRIM DS) 800-160 MG tablet Take 1 tablet by mouth 2 (two) times daily. For urinary tract infection.   tamsulosin (FLOMAX) 0.4 MG CAPS capsule Take 0.4 mg by mouth daily.   topiramate (TOPAMAX) 50 MG tablet Take 1 tablet (50 mg total) by mouth daily.   [DISCONTINUED] pantoprazole (PROTONIX) 40 MG tablet TAKE 1 TABLET (40 MG TOTAL) BY MOUTH DAILY. FOR HEARTBURN.   pantoprazole (PROTONIX) 40 MG tablet Take 1 tablet (40 mg total) by mouth 2 (two) times daily before a meal. For heartburn.   SUMAtriptan (IMITREX) 100 MG tablet Take 100 mg by mouth as needed.   No facility-administered encounter medications on file as of 03/12/2022.   Allergies  Allergen Reactions   Chlorine Other (See Comments)    Smell causes lightheadedness.   Dilaudid [Hydromorphone]     hallucinations   Codeine Anxiety  Darvon Anxiety   Hydrocodone Anxiety   Meperidine Anxiety   Oxycodone Anxiety   Tramadol Anxiety   Victoza [Liraglutide] Nausea Only and Other (See Comments)    And weakness   Patient Active Problem List   Diagnosis Date Noted   Frontotemporal dementia ([place]) 12/08/2021   Scalp irritation 12/08/2021   Near syncope 12/08/2021   Visual hallucination 11/18/2021   Vaginal discharge 08/27/2021   Vitamin B 12 deficiency 03/10/2021   Recurrent falls  03/10/2021   Pelvic fullness in female 10/07/2020   Lewy body dementia ([place]) 04/15/2020   Urinary incontinence 04/15/2020   Recurrent cystitis 02/13/2020   Parkinson's disease ([place]) 07/11/2019   Leukocytosis 07/11/2019   Osteopenia 07/07/2018   Gastroesophageal reflux disease 03/13/2018   Chronic migraine without aura without status migrainosus, not intractable 03/13/2018   Constipation 02/15/2017   Spondylolisthesis of lumbar region 07/15/2016   Vitamin D deficiency 03/11/2016   Lumbar stenosis with neurogenic claudication 01/12/2016   Notalgia 01/08/2016   Restless leg syndrome 03/13/2015   Preventative health care 12/10/2014   Other fatigue 11/04/2014   Anemia 11/04/2014   Depression 10/31/2014   Prediabetes 10/31/2014   Obstructive sleep apnea 05/01/2009   Hyperlipidemia 09/04/2008   Obesity 09/04/2008   HYPERTENSION, BENIGN 09/04/2008   Social History   Socioeconomic History   Marital status: Married    Spouse name: Not on file   Number of children: 1   Years of education: Not on file   Highest education level: Not on file  Occupational History   Occupation: Product manager: [employer]    Comment: retired-special needs teacher  Tobacco Use   Smoking status: Never   Smokeless tobacco: Never  Vaping Use   Vaping Use: Never used  Substance and Sexual Activity   Alcohol use: No   Drug use: No   Sexual activity: Yes    Partners: Male    Birth control/protection: Post-menopausal  Other Topics Concern   Not on file  Social History Narrative   Married.   One son is 62, lives in [place].   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   Enjoys substitute teaching, gardening, cooking.   Social Determinants of Health   Financial Resource Strain: Low Risk  (07/09/2019)   Overall Financial Resource Strain (CARDIA)    Difficulty of Paying Living Expenses: Not hard at all  Food Insecurity: No Food Insecurity (07/09/2019)   Hunger Vital Sign    Worried About Running  Out of Food in the Last Year: Never true    Ran Out of Food in the Last Year: Never true  Transportation Needs: No Transportation Needs (07/09/2019)   PRAPARE - Hydrologist (Medical): No    Lack of Transportation (Non-Medical): No  Physical Activity: Inactive (07/09/2019)   Exercise Vital Sign    Days of Exercise per Week: 0 days    Minutes of Exercise per Session: 0 min  Stress: Stress Concern Present (07/09/2019)   [place]    Feeling of Stress : To some extent  Social Connections: Not on file  Intimate Partner Violence: Not At Risk (07/09/2019)   Humiliation, Afraid, Rape, and Kick questionnaire    Fear of Current or Ex-Partner: No    Emotionally Abused: No    Physically Abused: No    Sexually Abused: No    Ms. [name]'s family history includes Breast cancer (age of onset: 54) in her sister; Cancer in her maternal  grandfather and mother; Cancer (age of onset: 32) in her brother; Diabetes in her brother, father, mother, and sister; Esophageal cancer in her brother; Heart disease in her brother, father, and mother; Heart failure in her brother and mother; Hypertension in her father and mother; Rashes / Skin problems in her father.      Objective:    Vitals:   03/12/22 1144  BP: 118/70  Pulse: (!) 45    Physical Exam Well-developed well-nourished  WF  in no acute distress.  Height, Weight,187  BMI34  HEENT; nontraumatic normocephalic, EOMI, PE R LA, sclera anicteric. Oropharynx; not examined today Neck; supple, no JVD Cardiovascular; regular rate and rhythm with S1-S2, no murmur rub or gallop Pulmonary; Clear bilaterally Abdomen; soft, obese, nontender, nondistended, no palpable mass or hepatosplenomegaly, bowel sounds are active Rectal; not done today Skin; benign exam, no jaundice rash or appreciable lesions Extremities; no clubbing cyanosis or edema skin warm and  dry Neuro/Psych; alert and oriented x4, grossly nonfocal mood and affect appropriate        Assessment & Plan:   #69 70 year old white female with history of chronic GERD and history of esophageal stricture status post dilation 2021 who comes in with 3 to 9-monthhistory of gradually progressive dysphagia and odynophagia to pills and solid food. She is having daily symptoms requiring regurgitation, and complains of a burning pain with lodging of food or pills. Suspect recurrent esophageal stricture with possible associated esophagitis.  #2 history of adenomatous colon polyp-due for follow-up colonoscopy October 2023  #3 hypertension #4.  Migraine headaches #5.  Restless leg syndrome #6.  Sleep apnea #7.  Parkinson's disease/Lewy body dementia  Plan; will increase Protonix to 40 mg p.o. twice daily AC breakfast and AC dinner. May continue famotidine/Pepcid 20 mg once or twice daily as needed. We discussed avoidance of meats, and dry foods for now. She will be scheduled for EGD with probable esophageal dilation with Dr. PHenrene Pastorwithin the next couple of weeks.  Procedure was discussed in detail with the patient including indications risk and benefits and she is agreeable to proceed.  Once her cute issues with dysphagia and odynophagia are improved, she can be scheduled for colonoscopy later this year with Dr. PHenrene Pastor  [employer] SGenia HaroldPA-C 03/12/2022   Cc: CPleas Koch NP

## 2022-03-12 NOTE — Telephone Encounter (Signed)
STAT if HR is under 50 or over 120 (normal HR is 60-100 beats per minute)   What is your heart rate? 45   Do you have a log of your heart rate readings (document readings)? Yes    Do you have any other symptoms? Lethargic; headache; fatigue   Patient ended up hanging up

## 2022-03-12 NOTE — Telephone Encounter (Signed)
Spoke with pt. Pt reports that her HR usually runs in the 90s, she does check her BP/HR daily. Monday of this week pt had an episode of chest pain radiating down left arm and severe headache, this lasted for ~15 minutes. After symptoms resolved pt's husband checked her BP/HR and noted her HR was in the 30s. Pt states her HR has stayed in the 40s since Monday. Pt denies any acute symptoms at this time, denies chest pain, shortness of breath, however, she has had significantly increased fatigue all week. Fatigue affecting her so much that she has had to cancel all of her appointments this week d/t feeling unwell. The only appt she did go to was today, to see GI for s/s of "feels like every time I swallow pills or food it does not go down all the way." Pt scheduled for EGD and Protonix increased. Pt's BP today was 118/70 and HR was 45 at her GI appointment.   Pt last ov with Dr. Rockey Situ 12/29/21, HR 105 at ov. Pt w/ h/o tachycardia. Pt no longer taking beta blocker or any meds to affect HR at this time.   Advised pt to proceed to the emergency room now d/t new onset symptoms and symptomatic bradycardia.  Pt voiced understanding and confirms that she will have her husband drive her to the ER today.   Will forward to Dr. Rockey Situ to make aware.

## 2022-03-12 NOTE — Telephone Encounter (Signed)
STAT if HR is under 50 or over 120 (normal HR is 60-100 beats per minute)  What is your heart rate? 45  Do you have a log of your heart rate readings (document readings)? Yes   Do you have any other symptoms? Lethargic; headache; fatigue

## 2022-03-12 NOTE — Patient Instructions (Addendum)
If you are age 70 or older, your body mass index should be between 23-30. Your Body mass index is 34.2 kg/m. If this is out of the aforementioned range listed, please consider follow up with your Primary Care Provider. ________________________________________________________  The [place] [facility] providers would like to encourage you to use [facility] to communicate with providers for non-urgent requests or questions.  Due to long hold times on the telephone, sending your provider a message by [facility] may be a faster and more efficient way to get a response.  Please allow 48 business hours for a response.  Please remember that this is for non-urgent requests.  _______________________________________________________  Dennis Bast have been scheduled for an endoscopy. Please follow written instructions given to you at your visit today. If you use inhalers (even only as needed), please bring them with you on the day of your procedure.  Increase your Pantoprazole to 40 mg 1 tablet twice daily before breakfast and dinner.  You will need to schedule for your Colonoscopy this fall.  Thank you for entrusting me with your care and choosing [facility].  Amy Esterwood, PA-C

## 2022-03-15 ENCOUNTER — Encounter: Payer: Medicare Other | Admitting: Internal Medicine

## 2022-03-15 NOTE — Telephone Encounter (Signed)
Minna Merritts, MD  You 2 days ago   Can we get an ekg? Could be PVCs or PAC in bigeminal pattern  Thx  TG    Spoke with pt's husband (DPR), states pt did not go to the ER. Notified per Dr. Rockey Situ we should get an EKG and pt needs follow up of symptoms. Dr. Rockey Situ had an opening on scheduled this Wednesday 03/17/22 at 1120. Scheduled pt to see Dr. Rockey Situ in office with EKG. Husband confirmed appt and understands to arrive 15 min early. No further questions at this time.

## 2022-03-16 NOTE — Progress Notes (Unsigned)
Date:  03/17/2022   ID:  Katherine, [name] 02-14-1951, MRN [id number]  Patient Location:  [address] [place] [address]   Provider location:   [facility], [place] office  PCP:  Katherine Koch, NP  Cardiologist:  Katherine [name] [facility]  Chief Complaint  Patient presents with   Chest pain     Patient c/o chest pain/bradycardia. Patient had a spell of chest pain that radiated down her left arm that lasted about 10-15 minutes and then her heart rate  decreased in the 30's and remained low for 3 days. Medications reviewed by the patient verbally.     History of Present Illness:    Katherine [name] is a 71 y.o. female  past medical history of CP with no CAD on cath 2007,  HTN  Morbid obesity Chronic back pain, prior back surgery Possible sleep apnea by history Parkinson's , seen at [facility] Jan 2023, frontotemporal dementia (FTD) vs Lewy Body Dementia.  She returns today for routine follow-up Of her blood pressure, history of hypokalemia  Last seen in clinic by myself 6/23 Reports having chronic insomnia  Last week with left side chest pain, reports it was severe radiating down her left arm lasted for quite a while went away without intervention Check vitals at the time, pulse "was 35", BP was low Got migraine h/a shortly after, took a medication for this Next two days, pulse low Seen by GI, pulse 45 bpm No weak EKG obtained to confirm rhythm  EGD next week for GERD symptoms, regurgitation which is chronic issue  Denies chest pain on walking  EKG personally reviewed by myself on todays visit  Sinus tach rate 101 no significant ST or T wave changes  Other past medical history reviewed Seen in the hospital Dec 02, 2021 with near syncope dizziness and a near-fall.  Patient also reports some nausea without vomiting following the incident had not eaten , skipped breakfast , was rushing through the airport to catch a flight that was  ultimately delayed.  Patient began to experience some near syncopal episodes, was able to lower herself to the ground holding onto a chair.  She has had previous episodes of similar incidences  Previously on simvastatin and carvedilol  Previously reported having hallucinations at times On last clinic visit, thinking of hiring a private detective to monitor her husband who she feels may be having an affair, debit card keeps going missing, money missing from her account, jewelry missing, feels someone is coming into her house and taking her belongings On NUPLAZID  Followed by neurology  Echo   1. Left ventricular ejection fraction, by estimation, is 60 to 65%. The  left ventricle has normal function. The left ventricle has no regional  wall motion abnormalities. Left ventricular diastolic parameters are  consistent with Grade I diastolic  dysfunction (impaired relaxation).   2. [name] ventricular systolic function is normal. The [name] ventricular  size is normal. There is normal pulmonary artery systolic pressure. The  estimated [name] ventricular systolic pressure is 56.8 mmHg.    Past Medical History:  Diagnosis Date   Anemia 2018   Anxiety    Arthritis    Borderline diabetes    Chest pain    a. 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 [facility]   Depression    Dyspnea on exertion    a. 03/2009 Echo: EF 65%, no rwma, triv TR.   GERD (gastroesophageal reflux disease)    High cholesterol    Hypertension    Hypokalemia    Migraine headache    Obesity    Parkinson's disease ([place])    Pre-diabetes    okay now    Tachycardia    Past Surgical History:  Procedure Laterality Date   BLADDER SUSPENSION  2005   CARDIAC CATHETERIZATION     CESAREAN SECTION     CLOSED REDUCTION NASAL FRACTURE N/A 01/11/2020   Procedure:  CLOSED REDUCTION NASAL FRACTURE;  Surgeon: Aleyza Gust, MD;  Location: [facility];  Service: ENT;  Laterality: N/A;   ENDOMETRIAL ABLATION  2007   FOOT SURGERY     bilateral bunions   LUMBAR LAMINECTOMY/DECOMPRESSION MICRODISCECTOMY Bilateral 01/12/2016   Procedure: Bilateral L4-5 Laminotomy/Foraminotomy;  Surgeon: Newman Pies, MD;  Location: [facility];  Service: Neurosurgery;  Laterality: Bilateral;  Bilateral L4-5 Laminotomy/Foraminotomy   TONSILLECTOMY  1971     Allergies:   Chlorine, Dilaudid [hydromorphone], Codeine, Darvon, Hydrocodone, Meperidine, Oxycodone, Tramadol, and Victoza [liraglutide]   Social History   Tobacco Use   Smoking status: Never   Smokeless tobacco: Never  Vaping Use   Vaping Use: Never used  Substance Use Topics   Alcohol use: No   Drug use: No     Current Outpatient Medications on File Prior to Visit  Medication Sig Dispense Refill   clobetasol (TEMOVATE) 0.05 % external solution Apply 1 application. topically 2 (two) times daily. 50 mL 0   DULoxetine (CYMBALTA) 30 MG capsule Take 30 mg by mouth daily.     DULoxetine (CYMBALTA) 60 MG capsule Take 60 mg by mouth every morning. Takes with her '30mg'$  capsule to equal 90 mg daily     famotidine (PEPCID) 20 MG tablet TAKE 1 TABLET (20 MG TOTAL) BY MOUTH DAILY. FOR HEARTBURN. 90 tablet 1   gabapentin (NEURONTIN) 300 MG capsule TAKE 1 CAPSULE (300 MG TOTAL) BY MOUTH AT BEDTIME. FOR BACK PAIN. 90 capsule 2   Multiple Vitamin (MULTIVITAMIN) tablet Take 1 tablet by mouth daily.     NUPLAZID 34 MG CAPS Take 1 capsule by mouth daily.     OVER THE COUNTER MEDICATION [employer] vitamin     OVER THE COUNTER MEDICATION 2 tablets daily. Air Shield     pantoprazole (PROTONIX) 40 MG tablet Take 1 tablet (40 mg total) by mouth 2 (two) times daily before a meal. For heartburn. 60 tablet 4   spironolactone (ALDACTONE) 25 MG tablet Take 1 tablet (25 mg total) by mouth daily. 90 tablet 3    sulfamethoxazole-trimethoprim (BACTRIM DS) 800-160 MG tablet Take 1 tablet by mouth 2 (two) times daily. For urinary tract infection. 10 tablet 0   SUMAtriptan (IMITREX) 100 MG tablet Take 100 mg by mouth as needed.     tamsulosin (FLOMAX) 0.4 MG CAPS capsule Take 0.4 mg by mouth daily.     topiramate (TOPAMAX) 50 MG tablet Take 1 tablet (50 mg total) by mouth daily.     No current facility-administered medications on file prior to visit.     Family Hx: The patient's family history includes Breast cancer (age of onset: 2) in her sister; Cancer in her maternal grandfather and mother; Cancer (age of onset: 57) in her brother; Diabetes in her brother, father, mother, and sister; Esophageal cancer in her brother;  Heart disease in her brother, father, and mother; Heart failure in her brother and mother; Hypertension in her father and mother; Rashes / Skin problems in her father. There is no history of Colon cancer, Stomach cancer, or Rectal cancer.  ROS:   Please see the history of present illness.    Review of Systems  Constitutional: Negative.   HENT: Negative.    Respiratory: Negative.    Cardiovascular: Negative.   Gastrointestinal: Negative.   Musculoskeletal: Negative.   Neurological:  Positive for dizziness.  Psychiatric/Behavioral: Negative.    All other systems reviewed and are negative.    Labs/Other Tests and Data Reviewed:    Recent Labs: 12/02/2021: TSH 1.180 02/05/2022: BUN 14; Creatinine, Ser 0.79; Potassium 3.7; Sodium 142 02/12/2022: Hemoglobin 13.9; Platelets 284.0   Recent Lipid Panel Lab Results  Component Value Date/Time   CHOL 211 (H) 03/10/2021 08:45 AM   CHOL 123 06/22/2013 08:09 AM   TRIG 266.0 (H) 03/10/2021 08:45 AM   HDL 57.40 03/10/2021 08:45 AM   HDL 48 06/22/2013 08:09 AM   CHOLHDL 4 03/10/2021 08:45 AM   LDLCALC 60 04/15/2020 12:41 PM   LDLCALC 60 06/22/2013 08:09 AM   LDLDIRECT 143.0 03/10/2021 08:45 AM    Wt Readings from Last 3 Encounters:   03/17/22 188 lb 2 oz (85.3 kg)  03/12/22 187 lb (84.8 kg)  02/05/22 188 lb (85.3 kg)     Exam:    BP 122/72 (BP Location: Left Arm, Patient Position: Sitting, Cuff Size: Normal)   Pulse (!) 101   Ht '5\' 2"'$  (1.575 m)   Wt 188 lb 2 oz (85.3 kg)   SpO2 98%   BMI 34.41 kg/m  Constitutional:  oriented to person, place, and time. No distress.  HENT:  Head: Grossly normal Eyes:  no discharge. No scleral icterus.  Neck: No JVD, no carotid bruits  Cardiovascular: Regular rate and rhythm, no murmurs appreciated Pulmonary/Chest: Clear to auscultation bilaterally, no wheezes or rails Abdominal: Soft.  no distension.  no tenderness.  Musculoskeletal: Normal range of motion Neurological:  normal muscle tone. Coordination normal. No atrophy Skin: Skin warm and dry Psychiatric: normal affect, pleasant  ASSESSMENT & PLAN:    Problem List Items Addressed This Visit       Cardiology Problems   Hyperlipidemia   Other Visit Diagnoses     Essential hypertension    -  Primary   Relevant Orders   EKG 12-Lead   Chest pain, unspecified type       Relevant Orders   EKG 12-Lead   Dyspnea, unspecified type       Relevant Orders   EKG 12-Lead   Sleep-disordered breathing       Tachycardia          Preop endiocsopy Overall she is Acceptable risk for procedure Will take propranolol before procedure given chronic sinus tachycardia Recent episode of chest pain with atypical and typical features Myoview ordered to be completed before endoscopy if scheduling will allow  Dizziness Chronic issue, worse in the Am She previously held [place] None recently, no regular exercise program Denies any recent falls  Dx with parkinsons, Followed by neurology  Tachycardia Used to be on coreg We have started prescription of propranolol 20 mg 3 times daily as needed for heart rate over 110  Parkinson's Followed by neurology, seen at [facility] On NUPLAZID    Total encounter time more  than 30 minutes  Greater than 50% was spent in counseling and  coordination of care with the patient   Signed, Ida Rogue, [place] Office [address], [place], [place] [address]

## 2022-03-17 ENCOUNTER — Encounter: Payer: Self-pay | Admitting: Cardiovascular Disease

## 2022-03-17 ENCOUNTER — Ambulatory Visit: Payer: Medicare Other | Attending: Cardiovascular Disease

## 2022-03-17 ENCOUNTER — Ambulatory Visit: Payer: Medicare Other | Attending: Cardiovascular Disease | Admitting: Cardiovascular Disease

## 2022-03-17 ENCOUNTER — Encounter: Payer: Self-pay | Admitting: Internal Medicine

## 2022-03-17 ENCOUNTER — Inpatient Hospital Stay: Payer: Medicare Other | Attending: Internal Medicine | Admitting: Internal Medicine

## 2022-03-17 VITALS — BP 122/72 | HR 101 | Ht 62.0 in | Wt 188.1 lb

## 2022-03-17 DIAGNOSIS — R079 Chest pain, unspecified: Secondary | ICD-10-CM | POA: Insufficient documentation

## 2022-03-17 DIAGNOSIS — D509 Iron deficiency anemia, unspecified: Secondary | ICD-10-CM | POA: Diagnosis not present

## 2022-03-17 DIAGNOSIS — Z888 Allergy status to other drugs, medicaments and biological substances status: Secondary | ICD-10-CM | POA: Insufficient documentation

## 2022-03-17 DIAGNOSIS — Z885 Allergy status to narcotic agent status: Secondary | ICD-10-CM | POA: Insufficient documentation

## 2022-03-17 DIAGNOSIS — K219 Gastro-esophageal reflux disease without esophagitis: Secondary | ICD-10-CM | POA: Insufficient documentation

## 2022-03-17 DIAGNOSIS — R000 Tachycardia, unspecified: Secondary | ICD-10-CM | POA: Insufficient documentation

## 2022-03-17 DIAGNOSIS — R0602 Shortness of breath: Secondary | ICD-10-CM

## 2022-03-17 DIAGNOSIS — Z809 Family history of malignant neoplasm, unspecified: Secondary | ICD-10-CM | POA: Diagnosis not present

## 2022-03-17 DIAGNOSIS — G20 Parkinson's disease: Secondary | ICD-10-CM | POA: Insufficient documentation

## 2022-03-17 DIAGNOSIS — Z803 Family history of malignant neoplasm of breast: Secondary | ICD-10-CM | POA: Diagnosis not present

## 2022-03-17 DIAGNOSIS — Z833 Family history of diabetes mellitus: Secondary | ICD-10-CM | POA: Diagnosis not present

## 2022-03-17 DIAGNOSIS — Z8049 Family history of malignant neoplasm of other genital organs: Secondary | ICD-10-CM | POA: Diagnosis not present

## 2022-03-17 DIAGNOSIS — R0600 Dyspnea, unspecified: Secondary | ICD-10-CM | POA: Insufficient documentation

## 2022-03-17 DIAGNOSIS — R5383 Other fatigue: Secondary | ICD-10-CM | POA: Diagnosis not present

## 2022-03-17 DIAGNOSIS — Z79899 Other long term (current) drug therapy: Secondary | ICD-10-CM | POA: Diagnosis not present

## 2022-03-17 DIAGNOSIS — Z808 Family history of malignant neoplasm of other organs or systems: Secondary | ICD-10-CM | POA: Diagnosis not present

## 2022-03-17 DIAGNOSIS — G8929 Other chronic pain: Secondary | ICD-10-CM

## 2022-03-17 DIAGNOSIS — Z8249 Family history of ischemic heart disease and other diseases of the circulatory system: Secondary | ICD-10-CM | POA: Diagnosis not present

## 2022-03-17 DIAGNOSIS — Z8379 Family history of other diseases of the digestive system: Secondary | ICD-10-CM

## 2022-03-17 DIAGNOSIS — Z801 Family history of malignant neoplasm of trachea, bronchus and lung: Secondary | ICD-10-CM

## 2022-03-17 DIAGNOSIS — G4730 Sleep apnea, unspecified: Secondary | ICD-10-CM | POA: Diagnosis not present

## 2022-03-17 DIAGNOSIS — Z800 Family history of malignant neoplasm of digestive organs: Secondary | ICD-10-CM | POA: Diagnosis not present

## 2022-03-17 DIAGNOSIS — I10 Essential (primary) hypertension: Secondary | ICD-10-CM | POA: Diagnosis not present

## 2022-03-17 DIAGNOSIS — E782 Mixed hyperlipidemia: Secondary | ICD-10-CM | POA: Insufficient documentation

## 2022-03-17 DIAGNOSIS — E611 Iron deficiency: Secondary | ICD-10-CM

## 2022-03-17 MED ORDER — PROPRANOLOL HCL 20 MG PO TABS: 20.0000 mg | ORAL_TABLET | Freq: Three times a day (TID) | ORAL | 0 refills | Status: DC | PRN

## 2022-03-17 NOTE — Assessment & Plan Note (Addendum)
#   iron deficiency -iron saturation 3.8% ferritin normal.  Hemoglobin 12-13-symptomatic fatigue; shortness of breath on exertion. Poor tolerance/lack of improvement on oral iron.   #  Discussed regarding IV iron infusion/Venofer. Discussed the potential acute infusion reactions with IV iron; which are quite rare.  Patient understands the risk; will proceed with infusions.  Patient had previous Feraheme infusions without any side effect.  #Etiology of iron deficiency: Unclear; I had a long discussion with the patient regarding multiple etiologies of anemia including iron deficiency-which is mainly caused by blood loss/malabsorption.  Multiple endoscopies in the past -awaiting repeat GI evaluation-EGD colonoscopy [OCT-nov 2023; Dr.Perry].  Consider capsule if not improved.   Thank you Ms.Clark NP. for allowing me to participate in the care of your pleasant patient. Please do not hesitate to contact me with questions or concerns in the interim.  # DISPOSITION: # labs today # ferrahem weekly  X 2; start next week.  # follow up in 6 months MD; possible ferrahem;  2-3 days PRIOR labs- cbc/cmp; iron studies; ferritin- -Dr.B  Cc; Ms.Kathrine Clark.

## 2022-03-17 NOTE — Patient Instructions (Addendum)
Zio monitor for tachy-brady   Myoview for chest pain/angina  Medication Instructions:  Please take propranolol 20 mg up to three times a day as needed for rate >110  Take one before the endoscopy  If you need a refill on your cardiac medications before your next appointment, please call your pharmacy.   Lab work: No new labs needed  Testing/Procedures: Your physician has requested that you have a lexiscan myoview. For further information please visit [email or web address]. Please follow instruction sheet, as given.    Your provider has ordered a heart monitor to wear for 14 days. This will be mailed to your home with instructions on placement. Once you have finished the time frame requested, you will return monitor in box provided.     Follow-Up: At [facility] you and your health needs are our priority.  As part of our continuing mission to provide you with exceptional heart care, we have created designated Provider Care Teams.  These Care Teams include your primary Cardiologist (physician) and Advanced Practice Providers (APPs -  Physician Assistants and Nurse Practitioners) who all work together to provide you with the care you need, when you need it.  You will need a follow up appointment in 12 months  Providers on your designated Care Team:   Murray Hodgkins, NP Christell Faith, PA-C Cadence Kathlen Mody, PA-C    [place] caregiver has ordered a Stress Test with nuclear imaging. The purpose of this test is to evaluate the blood supply to your heart muscle. This procedure is referred to as a "Non-Invasive Stress Test." This is because other than having an IV started in your vein, nothing is inserted or "invades" your body. Cardiac stress tests are done to find areas of poor blood flow to the heart by determining the extent of coronary artery disease (CAD). Some patients exercise on a treadmill, which naturally increases the blood flow to your heart, while others who are   unable to walk on a treadmill due to physical limitations have a pharmacologic/chemical stress agent called Lexiscan . This medicine will mimic walking on a treadmill by temporarily increasing your coronary blood flow.   Please note: these test may take anywhere between 2-4 hours to complete  PLEASE REPORT TO [facility] AT THE FIRST DESK WILL DIRECT YOU WHERE TO GO  Date of Procedure:_____________________________________  Arrival Time for Procedure:______________________________  Instructions regarding medication:    PLEASE NOTIFY THE OFFICE AT LEAST 24 HOURS IN ADVANCE IF YOU ARE UNABLE TO KEEP YOUR APPOINTMENT.  [phone] AND  PLEASE NOTIFY NUCLEAR MEDICINE AT [facility] AT LEAST 24 HOURS IN ADVANCE IF YOU ARE UNABLE TO KEEP YOUR APPOINTMENT. [phone]  How to prepare for your Myoview test:  Do not eat or drink after midnight No caffeine for 24 hours prior to test No smoking 24 hours prior to test. Your medication may be taken with water.  If your doctor stopped a medication because of this test, do not take that medication. Ladies, please do not wear dresses.  Skirts or pants are appropriate. Please wear a short sleeve shirt. No perfume, cologne or lotion. Wear comfortable walking shoes. No heels!

## 2022-03-17 NOTE — Progress Notes (Signed)
Katherine [name] NOTE  Patient Care Team: Katherine Koch, NP as PCP - General (Nurse Practitioner) Katherine Merritts, MD as PCP - Cardiology (Cardiology) Katherine Merritts, MD as Consulting Physician (Cardiology) Katherine Aloe, MD as Consulting Physician (Urology) Katherine Chapel, NP as Nurse Practitioner Katherine [name], Katherine [name], [facility] as Pharmacist (Pharmacist)  CHIEF COMPLAINTS/PURPOSE OF CONSULTATION: ANEMIA   HEMATOLOGY HISTORY  # ANEMIA[Hb; MCV-platelets- WBC; Iron sat; ferritin;   EGD- last -2021; awaiting EGD-SEP, 2023; colo 201-  awaiting fall- 2023; Dr.Perry; [place]];FERRAHEM- DEC 2022 Katherine [name] long] PCP-JULY 2023- Iron 28; Iron sat- 3.8%; Hb 13  HISTORY OF PRESENTING ILLNESS: By husband.  Ambulating independently.  Katherine [name] 71 y.o.  female pleasant patient was been referred to [place] for further evaluation of anemia.  Patient had prior episodes of anemia needing iron infusions in the past.  She complains of worsening fatigue.  Shortness of breath on minimal exertion.  Blood in stools: none Blood in urine: none Difficulty swallowing: Prior blood transfusion: none Prior history of blood loss:  Liver disease:none Alcohol: none Bariatric surgery:none  Vaginal bleeding: none Prior evaluation with hematology:none Oral iron: "don't agree"- cause contipation Prior IV iron infusions: yes. Katherine [name]- last fall, 2022.   Review of Systems  Constitutional:  Positive for malaise/fatigue. Negative for chills, diaphoresis, fever and weight loss.  HENT:  Negative for nosebleeds and sore throat.   Eyes:  Negative for double vision.  Respiratory:  Positive for shortness of breath. Negative for cough, hemoptysis, sputum production and wheezing.   Cardiovascular:  Negative for chest pain, palpitations, orthopnea and leg swelling.  Gastrointestinal:  Negative for abdominal pain, blood in stool, constipation, diarrhea, heartburn, melena, nausea and vomiting.   Genitourinary:  Negative for dysuria, frequency and urgency.  Musculoskeletal:  Negative for back pain and joint pain.  Skin: Negative.  Negative for itching and rash.  Neurological:  Negative for dizziness, tingling, focal weakness, weakness and headaches.  Endo/Heme/Allergies:  Does not bruise/bleed easily.  Psychiatric/Behavioral:  Negative for depression. The patient is not nervous/anxious and does not have insomnia.      MEDICAL HISTORY:  Past Medical History:  Diagnosis Date   Anemia 2018   Anxiety    Arthritis    Borderline diabetes    Chest pain    a. Normal coronaries by cath 2007; Anomalous RCA arising from LAD diagonal (versus total native RCA with collateral)   Chicken pox    age 81   Chronic back pain    Compressed discs. [name]   Depression    Dyspnea on exertion    a. 03/2009 Echo: EF 65%, no rwma, triv TR.   GERD (gastroesophageal reflux disease)    High cholesterol    Hypertension    Hypokalemia    Migraine headache    Obesity    Parkinson's disease ([place])    Pre-diabetes    okay now    Tachycardia     SURGICAL HISTORY: Past Surgical History:  Procedure Laterality Date   BLADDER SUSPENSION  2005   CARDIAC CATHETERIZATION     CESAREAN SECTION     CLOSED REDUCTION NASAL FRACTURE N/A 01/11/2020   Procedure: CLOSED REDUCTION NASAL FRACTURE;  Surgeon: Katherine Gust, MD;  Location: [place];  Service: ENT;  Laterality: N/A;   ENDOMETRIAL ABLATION  2007   FOOT SURGERY     bilateral bunions   LUMBAR LAMINECTOMY/DECOMPRESSION MICRODISCECTOMY Bilateral 01/12/2016   Procedure: Bilateral L4-5 Laminotomy/Foraminotomy;  Surgeon: Katherine Pies, MD;  Location: [facility];  Service: Neurosurgery;  Laterality: Bilateral;  Bilateral L4-5 Laminotomy/Foraminotomy   TONSILLECTOMY  1971    SOCIAL HISTORY: Social History   Socioeconomic History   Marital status: Married    Spouse name: Not on file   Number of children: 1    Years of education: Not on file   Highest education level: Not on file  Occupational History   Occupation: Product manager: [employer]    Comment: retired-special needs teacher  Tobacco Use   Smoking status: Never   Smokeless tobacco: Never  Vaping Use   Vaping Use: Never used  Substance and Sexual Activity   Alcohol use: No   Drug use: No   Sexual activity: Yes    Partners: Male    Birth control/protection: Post-menopausal  Other Topics Concern   Not on file  Social History Narrative   Married.   One son is 60, lives in [place].   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   Enjoys substitute teaching, gardening, cooking.   Social Determinants of Health   Financial Resource Strain: Low Risk  (07/09/2019)   Overall Financial Resource Strain (CARDIA)    Difficulty of Paying Living Expenses: Not hard at all  Food Insecurity: No Food Insecurity (07/09/2019)   Hunger Vital Sign    Worried About Running Out of Food in the Last Year: Never true    Ran Out of Food in the Last Year: Never true  Transportation Needs: No Transportation Needs (07/09/2019)   PRAPARE - Hydrologist (Medical): No    Lack of Transportation (Non-Medical): No  Physical Activity: Inactive (07/09/2019)   Exercise Vital Sign    Days of Exercise per Week: 0 days    Minutes of Exercise per Session: 0 min  Stress: Stress Concern Present (07/09/2019)   [place]    Feeling of Stress : To some extent  Social Connections: Not on file  Intimate Partner Violence: Not At Risk (07/09/2019)   Humiliation, Afraid, Rape, and Kick questionnaire    Fear of Current or Ex-Partner: No    Emotionally Abused: No    Physically Abused: No    Sexually Abused: No    FAMILY HISTORY: Family History  Problem Relation Age of Onset   Heart disease Mother    Hypertension Mother    Heart failure Mother        CHF   Diabetes  Mother    Cancer Mother        CERVICAL CANCER   Atrial fibrillation Mother    Heart disease Father    Diabetes Father    Rashes / Skin problems Father    Hypertension Father    Cirrhosis Father        liver disease non alcoholic   Diabetes Sister    Breast cancer Sister 66   Diabetes Mellitus II Sister    Heart disease Brother    Heart failure Brother        CHF   Diabetes Brother    Cancer Brother 75       THROAT AND NECK   Esophageal cancer Brother    Lung cancer Brother    Atrial fibrillation Brother    Cancer Maternal Grandfather    Colon cancer Neg Hx    Stomach cancer Neg Hx    Rectal cancer Neg Hx     ALLERGIES:  is allergic to chlorine, dilaudid [hydromorphone], codeine, darvon,  hydrocodone, meperidine, oxycodone, tramadol, and victoza [liraglutide].  MEDICATIONS:  Current Outpatient Medications  Medication Sig Dispense Refill   clobetasol (TEMOVATE) 0.05 % external solution Apply 1 application. topically 2 (two) times daily. 50 mL 0   DULoxetine (CYMBALTA) 30 MG capsule Take 30 mg by mouth daily.     DULoxetine (CYMBALTA) 60 MG capsule Take 60 mg by mouth every morning. Takes with her '30mg'$  capsule to equal 90 mg daily     famotidine (PEPCID) 20 MG tablet TAKE 1 TABLET (20 MG TOTAL) BY MOUTH DAILY. FOR HEARTBURN. 90 tablet 1   gabapentin (NEURONTIN) 300 MG capsule TAKE 1 CAPSULE (300 MG TOTAL) BY MOUTH AT BEDTIME. FOR BACK PAIN. 90 capsule 2   Multiple Vitamin (MULTIVITAMIN) tablet Take 1 tablet by mouth daily.     NUPLAZID 34 MG CAPS Take 1 capsule by mouth daily.     OVER THE COUNTER MEDICATION Ol[employer] vitamin     OVER THE COUNTER MEDICATION 2 tablets daily. Air Shield     pantoprazole (PROTONIX) 40 MG tablet Take 1 tablet (40 mg total) by mouth 2 (two) times daily before a meal. For heartburn. 60 tablet 4   propranolol (INDERAL) 20 MG tablet Take 1 tablet (20 mg total) by mouth 3 (three) times daily as needed. For heartrate >110 bpm 90 tablet 0    spironolactone (ALDACTONE) 25 MG tablet Take 1 tablet (25 mg total) by mouth daily. 90 tablet 3   SUMAtriptan (IMITREX) 100 MG tablet Take 100 mg by mouth as needed.     tamsulosin (FLOMAX) 0.4 MG CAPS capsule Take 0.4 mg by mouth daily.     topiramate (TOPAMAX) 50 MG tablet Take 1 tablet (50 mg total) by mouth daily.     sulfamethoxazole-trimethoprim (BACTRIM DS) 800-160 MG tablet Take 1 tablet by mouth 2 (two) times daily. For urinary tract infection. (Patient not taking: Reported on 03/17/2022) 10 tablet 0   No current facility-administered medications for this visit.     Marland Kitchen  PHYSICAL EXAMINATION:   Vitals:   03/17/22 1401  BP: (!) 142/79  Pulse: 98  Temp: 97.7 F (36.5 C)  SpO2: 97%   Filed Weights   03/17/22 1401  Weight: 188 lb 12.8 oz (85.6 kg)    Physical Exam Vitals and nursing note reviewed.  HENT:     Head: Normocephalic and atraumatic.     Mouth/Throat:     Pharynx: Oropharynx is clear.  Eyes:     Extraocular Movements: Extraocular movements intact.     Pupils: Pupils are equal, round, and reactive to light.  Cardiovascular:     Rate and Rhythm: Normal rate and regular rhythm.  Pulmonary:     Comments: Decreased breath sounds bilaterally.  Abdominal:     Palpations: Abdomen is soft.  Musculoskeletal:        General: Normal range of motion.     Cervical back: Normal range of motion.  Skin:    General: Skin is warm.  Neurological:     General: No focal deficit present.     Mental Status: She is alert and oriented to person, place, and time.  Psychiatric:        Behavior: Behavior normal.        Judgment: Judgment normal.      LABORATORY DATA:  I have reviewed the data as listed Lab Results  Component Value Date   WBC 9.4 02/12/2022   HGB 13.9 02/12/2022   HCT 43.6 02/12/2022   MCV 91.3 02/12/2022  PLT 284.0 02/12/2022   Recent Labs    06/04/21 1548 12/02/21 1205 02/05/22 1012  NA 139 140 142  K 4.0 3.6 3.7  CL 106 109 109  CO2 '24 22  23  '$ GLUCOSE 74 143* 167*  BUN '15 14 14  '$ CREATININE 0.92 0.82 0.79  CALCIUM 9.1 8.8* 9.2  GFRNONAA  --  >60  --      No results found.  ASSESSMENT & PLAN:   Iron deficiency # iron deficiency -iron saturation 3.8% ferritin normal.  Hemoglobin 12-13-symptomatic fatigue; shortness of breath on exertion. Poor tolerance/lack of improvement on oral iron.   #  Discussed regarding IV iron infusion/Venofer. Discussed the potential acute infusion reactions with IV iron; which are quite rare.  Patient understands the risk; will proceed with infusions.  Patient had previous Feraheme infusions without any side effect.  #Etiology of iron deficiency: Unclear; I had a long discussion with the patient regarding multiple etiologies of anemia including iron deficiency-which is mainly caused by blood loss/malabsorption.  Multiple endoscopies in the past -awaiting repeat GI evaluation-EGD colonoscopy [OCT-nov 2023; Dr.Perry].  Consider capsule if not improved.   Thank you Ms.Clark NP. for allowing me to participate in the care of your pleasant patient. Please do not hesitate to contact me with questions or concerns in the interim.  # DISPOSITION: # labs today # ferrahem weekly  X 2; start next week.  # follow up in 6 months MD; possible ferrahem;  2-3 days PRIOR labs- cbc/cmp; iron studies; ferritin- -Dr.B  Cc; Ms.Kathrine Clark.     All questions were answered. The patient knows to call the clinic with any problems, questions or concerns.    Cammie Sickle, MD 03/17/2022 3:42 PM

## 2022-03-19 ENCOUNTER — Encounter
Admission: RE | Admit: 2022-03-19 | Discharge: 2022-03-19 | Disposition: A | Discharge status: Home / Self Care | Payer: Medicare Other | Source: Ambulatory Visit | Attending: Cardiovascular Disease | Admitting: Cardiovascular Disease

## 2022-03-19 ENCOUNTER — Other Ambulatory Visit (INDEPENDENT_AMBULATORY_CARE_PROVIDER_SITE_OTHER): Payer: Medicare Other | Admitting: Medical

## 2022-03-19 DIAGNOSIS — R079 Chest pain, unspecified: Secondary | ICD-10-CM

## 2022-03-19 LAB — NM MYOCAR MULTI W/SPECT W/WALL MOTION / EF
Base ST Depression (mm): 0 mm
LV dias vol: 40 mL (ref 46–106)
LV sys vol: 8 mL
Nuc Stress EF: 80 %
Peak HR: 92 {beats}/min
Rest HR: 91 {beats}/min
Rest Nuclear Isotope Dose: 10.1 mCi
SDS: 0
SRS: 0
SSS: 0
ST Depression (mm): 0 mm
Stress Nuclear Isotope Dose: 31.1 mCi
TID: 0.8

## 2022-03-19 MED ORDER — REGADENOSON 0.4 MG/5ML IV SOLN
0.4000 mg | Freq: Once | INTRAVENOUS | Status: AC
  Administered 2022-03-19: 0.4 mg via INTRAVENOUS
  Filled 2022-03-19: qty 5

## 2022-03-19 MED ORDER — TECHNETIUM TC 99M TETROFOSMIN IV KIT
31.1100 | PACK | Freq: Once | INTRAVENOUS | Status: AC | PRN
  Administered 2022-03-19: 31.11 via INTRAVENOUS

## 2022-03-19 MED ORDER — TECHNETIUM TC 99M TETROFOSMIN IV KIT
10.0000 | PACK | Freq: Once | INTRAVENOUS | Status: AC | PRN
  Administered 2022-03-19: 10.12 via INTRAVENOUS

## 2022-03-23 ENCOUNTER — Ambulatory Visit (AMBULATORY_SURGERY_CENTER): Payer: Medicare Other | Admitting: Internal Medicine

## 2022-03-23 ENCOUNTER — Encounter: Payer: Self-pay | Admitting: Internal Medicine

## 2022-03-23 VITALS — BP 115/71 | HR 78 | Temp 97.1°F | Resp 16 | Ht 62.0 in | Wt 187.0 lb

## 2022-03-23 DIAGNOSIS — Z8719 Personal history of other diseases of the digestive system: Secondary | ICD-10-CM

## 2022-03-23 DIAGNOSIS — K219 Gastro-esophageal reflux disease without esophagitis: Secondary | ICD-10-CM | POA: Diagnosis not present

## 2022-03-23 DIAGNOSIS — K222 Esophageal obstruction: Secondary | ICD-10-CM | POA: Diagnosis not present

## 2022-03-23 DIAGNOSIS — R1310 Dysphagia, unspecified: Secondary | ICD-10-CM | POA: Diagnosis not present

## 2022-03-23 MED ORDER — SODIUM CHLORIDE 0.9 % IV SOLN: 500.0000 mL | INTRAVENOUS | Status: DC

## 2022-03-23 NOTE — Patient Instructions (Signed)
NPO until 4:40pm Clear liquids until 5:40pm Soft diet for 24 hr   YOU HAD AN ENDOSCOPIC PROCEDURE TODAY: Refer to the procedure report and other information in the discharge instructions given to you for any specific questions about what was found during the examination. If this information does not answer your questions, please call [place] office at [phone] to clarify.   YOU SHOULD EXPECT: Some feelings of bloating in the abdomen. Passage of more gas than usual. Walking can help get rid of the air that was put into your GI tract during the procedure and reduce the bloating. If you had a lower endoscopy (such as a colonoscopy or flexible sigmoidoscopy) you may notice spotting of blood in your stool or on the toilet paper. Some abdominal soreness may be present for a day or two, also.  DIET: Your first meal following the procedure should be a light meal and then it is ok to progress to your normal diet. A half-sandwich or bowl of soup is an example of a good first meal. Heavy or fried foods are harder to digest and may make you feel nauseous or bloated. Drink plenty of fluids but you should avoid alcoholic beverages for 24 hours. If you had a esophageal dilation, please see attached instructions for diet.    ACTIVITY: Your care partner should take you home directly after the procedure. You should plan to take it easy, moving slowly for the rest of the day. You can resume normal activity the day after the procedure however YOU SHOULD NOT DRIVE, use power tools, machinery or perform tasks that involve climbing or major physical exertion for 24 hours (because of the sedation medicines used during the test).   SYMPTOMS TO REPORT IMMEDIATELY: A gastroenterologist can be reached at any hour. Please call [phone]  for any of the following symptoms:  Following upper endoscopy (EGD, EUS, ERCP, esophageal dilation) Vomiting of blood or coffee ground material  New, significant abdominal pain  New,  significant chest pain or pain under the shoulder blades  Painful or persistently difficult swallowing  New shortness of breath  Black, tarry-looking or red, bloody stools  FOLLOW UP:  If any biopsies were taken you will be contacted by phone or by letter within the next 1-3 weeks. Call [phone]  if you have not heard about the biopsies in 3 weeks.  Please also call with any specific questions about appointments or follow up tests.

## 2022-03-23 NOTE — Progress Notes (Unsigned)
A and O x3. Report to RN. Tolerated MAC anesthesia well.Teeth unchanged after procedure. 

## 2022-03-23 NOTE — Progress Notes (Unsigned)
Subjective:      Subjective  Patient ID: Katherine [name], female    DOB: September 21, 1950, 71 y.o.   MRN: [id number]   HPI Katherine [name] is a pleasant 71 year old female, established with Dr. Henrene Pastor who comes in today with complaints of 3 to 27-monthhistory of gradually progressive dysphagia and odynophagia. She says that she has dysphagia with pills and all solid foods and has been avoiding meat and heavier foods recently because of symptoms.  No difficulty with liquids.  She describes a sensation of her food getting stuck consistently as do her pills and then having a burning discomfort in her chest associated.  She is having frequent episodes of regurgitation of food.  She says if she does not regurgitate then sometimes it will take hours for her food to traverse and be uncomfortable. She does have history of prior esophageal stricture and had EGD in March 2021 which showed a moderate sliding hiatal hernia and a benign distal stricture at that time 1.4 cm which was balloon dilated. Last colonoscopy October 2018 with finding of a 9 mm polyp in the ascending colon which was a tubular adenoma and noted multiple diverticuli.  She had indication for 5-year interval follow-up. Other medical issues include hypertension, migraine headaches, restless leg syndrome, sleep apnea, and history of Parkinson's disease/Lewy body dementia.   She has been on Protonix 40 mg p.o. every afternoon and Pepcid 20 mg p.o. every morning. No regular aspirin or NSAIDs.   No current complaints of changes in bowel habits, no melena or hematochezia.   Review of Systems Pertinent positive and negative review of systems were noted in the above HPI section.  All other review of systems was otherwise negative.        Outpatient Encounter Medications as of 03/12/2022  Medication Sig   clobetasol (TEMOVATE) 0.05 % external solution Apply 1 application. topically 2 (two) times daily.   DULoxetine (CYMBALTA) 30 MG capsule Take 30 mg by mouth  daily.   DULoxetine (CYMBALTA) 60 MG capsule Take 60 mg by mouth every morning. Takes with her '30mg'$  capsule to equal 90 mg daily   famotidine (PEPCID) 20 MG tablet TAKE 1 TABLET (20 MG TOTAL) BY MOUTH DAILY. FOR HEARTBURN.   gabapentin (NEURONTIN) 300 MG capsule TAKE 1 CAPSULE (300 MG TOTAL) BY MOUTH AT BEDTIME. FOR BACK PAIN.   Multiple Vitamin (MULTIVITAMIN) tablet Take 1 tablet by mouth daily.   NUPLAZID 34 MG CAPS Take 1 capsule by mouth daily.   OVER THE COUNTER MEDICATION Olly Beauty vitamin   OVER THE COUNTER MEDICATION 2 tablets daily. Air Shield   spironolactone (ALDACTONE) 25 MG tablet Take 1 tablet (25 mg total) by mouth daily.   sulfamethoxazole-trimethoprim (BACTRIM DS) 800-160 MG tablet Take 1 tablet by mouth 2 (two) times daily. For urinary tract infection.   tamsulosin (FLOMAX) 0.4 MG CAPS capsule Take 0.4 mg by mouth daily.   topiramate (TOPAMAX) 50 MG tablet Take 1 tablet (50 mg total) by mouth daily.   [DISCONTINUED] pantoprazole (PROTONIX) 40 MG tablet TAKE 1 TABLET (40 MG TOTAL) BY MOUTH DAILY. FOR HEARTBURN.   pantoprazole (PROTONIX) 40 MG tablet Take 1 tablet (40 mg total) by mouth 2 (two) times daily before a meal. For heartburn.   SUMAtriptan (IMITREX) 100 MG tablet Take 100 mg by mouth as needed.    No facility-administered encounter medications on file as of 03/12/2022.         Allergies  Allergen Reactions   Chlorine Other (See Comments)  Smell causes lightheadedness.   Dilaudid [Hydromorphone]        hallucinations   Codeine Anxiety   Darvon Anxiety   Hydrocodone Anxiety   Meperidine Anxiety   Oxycodone Anxiety   Tramadol Anxiety   Victoza [Liraglutide] Nausea Only and Other (See Comments)      And weakness        Patient Active Problem List    Diagnosis Date Noted   Frontotemporal dementia ([place]) 12/08/2021   Scalp irritation 12/08/2021   Near syncope 12/08/2021   Visual hallucination 11/18/2021   Vaginal discharge 08/27/2021   Vitamin B 12  deficiency 03/10/2021   Recurrent falls 03/10/2021   Pelvic fullness in female 10/07/2020   Lewy body dementia ([place]) 04/15/2020   Urinary incontinence 04/15/2020   Recurrent cystitis 02/13/2020   Parkinson's disease ([place]) 07/11/2019   Leukocytosis 07/11/2019   Osteopenia 07/07/2018   Gastroesophageal reflux disease 03/13/2018   Chronic migraine without aura without status migrainosus, not intractable 03/13/2018   Constipation 02/15/2017   Spondylolisthesis of lumbar region 07/15/2016   Vitamin D deficiency 03/11/2016   Lumbar stenosis with neurogenic claudication 01/12/2016   Notalgia 01/08/2016   Restless leg syndrome 03/13/2015   Preventative health care 12/10/2014   Other fatigue 11/04/2014   Anemia 11/04/2014   Depression 10/31/2014   Prediabetes 10/31/2014   Obstructive sleep apnea 05/01/2009   Hyperlipidemia 09/04/2008   Obesity 09/04/2008   HYPERTENSION, BENIGN 09/04/2008    Social History         Socioeconomic History   Marital status: Married      Spouse name: Not on file   Number of children: 1   Years of education: Not on file   Highest education level: Not on file  Occupational History   Occupation: Geophysical data processor: [employer]      Comment: retired-special needs teacher  Tobacco Use   Smoking status: Never   Smokeless tobacco: Never  Vaping Use   Vaping Use: Never used  Substance and Sexual Activity   Alcohol use: No   Drug use: No   Sexual activity: Yes      Partners: Male      Birth control/protection: Post-menopausal  Other Topics Concern   Not on file  Social History Narrative    Married.    One son is 56, lives in [place].    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    Enjoys substitute teaching, gardening, cooking.    Social Determinants of Health        Financial Resource Strain: Low Risk  (07/09/2019)    Overall Financial Resource Strain (CARDIA)     Difficulty of Paying Living Expenses: Not hard at all  Food Insecurity: No Food  Insecurity (07/09/2019)    Hunger Vital Sign     Worried About Running Out of Food in the Last Year: Never true     Ran Out of Food in the Last Year: Never true  Transportation Needs: No Transportation Needs (07/09/2019)    PRAPARE - Armed forces logistics/support/administrative officer (Medical): No     Lack of Transportation (Non-Medical): No  Physical Activity: Inactive (07/09/2019)    Exercise Vital Sign     Days of Exercise per Week: 0 days     Minutes of Exercise per Session: 0 min  Stress: Stress Concern Present (07/09/2019)    [place]     Feeling of Stress : To some extent  Social Connections: Not on file  Intimate Partner Violence: Not At Risk (07/09/2019)    Humiliation, Afraid, Rape, and Kick questionnaire     Fear of Current or Ex-Partner: No     Emotionally Abused: No     Physically Abused: No     Sexually Abused: No      Ms. Belmar's family history includes Breast cancer (age of onset: 62) in her sister; Cancer in her maternal grandfather and mother; Cancer (age of onset: 49) in her brother; Diabetes in her brother, father, mother, and sister; Esophageal cancer in her brother; Heart disease in her brother, father, and mother; Heart failure in her brother and mother; Hypertension in her father and mother; Rashes / Skin problems in her father.         Objective:    Objective     Vitals:    03/12/22 1144  BP: 118/70  Pulse: (!) 45      Physical Exam Well-developed well-nourished  WF  in no acute distress.  Height, Weight,187  BMI34   HEENT; nontraumatic normocephalic, EOMI, PE R LA, sclera anicteric. Oropharynx; not examined today Neck; supple, no JVD Cardiovascular; regular rate and rhythm with S1-S2, no murmur rub or gallop Pulmonary; Clear bilaterally Abdomen; soft, obese, nontender, nondistended, no palpable mass or hepatosplenomegaly, bowel sounds are active Rectal; not done today Skin; benign  exam, no jaundice rash or appreciable lesions Extremities; no clubbing cyanosis or edema skin warm and dry Neuro/Psych; alert and oriented x4, grossly nonfocal mood and affect appropriate            Assessment & Plan:    #37 71 year old white female with history of chronic GERD and history of esophageal stricture status post dilation 2021 who comes in with 3 to 41-monthhistory of gradually progressive dysphagia and odynophagia to pills and solid food. She is having daily symptoms requiring regurgitation, and complains of a burning pain with lodging of food or pills. Suspect recurrent esophageal stricture with possible associated esophagitis.   #2 history of adenomatous colon polyp-due for follow-up colonoscopy October 2023   #3 hypertension #4.  Migraine headaches #5.  Restless leg syndrome #6.  Sleep apnea #7.  Parkinson's disease/Lewy body dementia   Plan; will increase Protonix to 40 mg p.o. twice daily AC breakfast and AC dinner. May continue famotidine/Pepcid 20 mg once or twice daily as needed. We discussed avoidance of meats, and dry foods for now. She will be scheduled for EGD with probable esophageal dilation with Dr. PHenrene Pastorwithin the next couple of weeks.  Procedure was discussed in detail with the patient including indications risk and benefits and she is agreeable to proceed.   Once her cute issues with dysphagia and odynophagia are improved, she can be scheduled for colonoscopy later this year with Dr. PHenrene Pastor   Amy SGenia HaroldPA-C 03/12/2022

## 2022-03-23 NOTE — Op Note (Signed)
[facility] Patient Name: Katherine [name] Procedure Date: 03/23/2022 3:04 PM MRN: [id number] Endoscopist: Docia Chuck. Katherine [name] , MD Age: 71 Referring MD:  Date of Birth: 08/25/1950 Gender: Female Account #: [id number] Procedure:                Upper GI endoscopy with balloon dilation of the                            esophagus. 20 mm max Indications:              Dysphagia, Therapeutic procedure, Esophageal reflux Medicines:                Monitored Anesthesia Care Procedure:                Pre-Anesthesia Assessment:                           - Prior to the procedure, a History and Physical                            was performed, and patient medications and                            allergies were reviewed. The patient's tolerance of                            previous anesthesia was also reviewed. The risks                            and benefits of the procedure and the sedation                            options and risks were discussed with the patient.                            All questions were answered, and informed consent                            was obtained. Prior Anticoagulants: The patient has                            taken no previous anticoagulant or antiplatelet                            agents. ASA Grade Assessment: II - A patient with                            mild systemic disease. After reviewing the risks                            and benefits, the patient was deemed in                            satisfactory condition to undergo the procedure.  After obtaining informed consent, the endoscope was                            passed under direct vision. Throughout the                            procedure, the patient's blood pressure, pulse, and                            oxygen saturations were monitored continuously. The                            Endoscope was introduced through the mouth, and                             advanced to the second part of duodenum. The upper                            GI endoscopy was accomplished without difficulty.                            The patient tolerated the procedure well. Scope In: Scope Out: Findings:                 One benign-appearing, intrinsic moderate stenosis                            was found 35 cm from the incisors. This stenosis                            measured 1.5 cm (inner diameter). A TTS dilator was                            passed through the scope. Dilation with an 18-19-20                            mm balloon dilator was performed to 20 mm. The ring                            was disrupted. The esophagus was slightly tortuous                            and foreshortened.                           The exam of the esophagus was otherwise normal.                           The stomach revealed a 5 cm sliding hiatal hernia.                            There were associated Cameron erosions. Stomach was  otherwise normal.                           The examined duodenum was normal.                           The cardia and gastric fundus were normal on                            retroflexion. Complications:            No immediate complications. Estimated Blood Loss:     Estimated blood loss: none. Impression:               1. Esophageal stricture status post dilation                           2. Moderate to large hiatal hernia with Katherine [name]                            erosions                           3. Otherwise normal EGD. Recommendation:           1. Patient has a contact number available for                            emergencies. The signs and symptoms of potential                            delayed complications were discussed with the                            patient. Return to normal activities tomorrow.                            Written discharge instructions were provided to the                             patient.                           2. Post dilation diet.                           3. Continue present medications (including                            pantoprazole twice daily).                           4. Office follow-up with Dr. Henrene [name] in 4 to 6 weeks Docia Chuck. Katherine Pastor, MD 03/23/2022 3:42:05 PM This report has been signed electronically.

## 2022-03-23 NOTE — Progress Notes (Signed)
Called to room to assist during endoscopic procedure.  Patient ID and intended procedure confirmed with present staff. Received instructions for my participation in the procedure from the performing physician.  

## 2022-03-24 ENCOUNTER — Telehealth: Payer: Self-pay

## 2022-03-24 ENCOUNTER — Other Ambulatory Visit: Payer: Self-pay

## 2022-03-24 ENCOUNTER — Ambulatory Visit: Payer: Medicare Other

## 2022-03-24 NOTE — Telephone Encounter (Signed)
  Follow up Call-     03/23/2022    3:08 PM 10/16/2019    9:35 AM  Call back number  Post procedure Call Back phone  # [phone]  Permission to leave phone message Yes Yes     Patient questions:  Do you have a fever, pain , or abdominal swelling? No. Pain Score  0 *  Have you tolerated food without any problems? Yes.    Have you been able to return to your normal activities? Yes.    Do you have any questions about your discharge instructions: Diet   No. Medications  No. Follow up visit  No.  Do you have questions or concerns about your Care? No.  (RN spoke with patient's spouse, Katherine [name] (patient was asleep).  Actions: * If pain score is 4 or above: No action needed, pain <4.

## 2022-03-24 NOTE — Telephone Encounter (Signed)
Left message for pt . She has viewed results on MyChart. Asked her to call back w/ any questions.

## 2022-03-24 NOTE — Telephone Encounter (Signed)
-----   Message from Minna Merritts, MD sent at 03/23/2022 12:43 PM EDT ----- Stress test No significant ischemia, normal ejection fraction Overall good looking study, low risk

## 2022-03-29 ENCOUNTER — Inpatient Hospital Stay: Payer: Medicare Other

## 2022-03-29 ENCOUNTER — Telehealth: Payer: Self-pay | Admitting: Internal Medicine

## 2022-03-29 ENCOUNTER — Other Ambulatory Visit: Payer: Self-pay

## 2022-03-29 DIAGNOSIS — R1310 Dysphagia, unspecified: Secondary | ICD-10-CM

## 2022-03-29 MED FILL — Ferumoxytol Inj 510 MG/17ML (30 MG/ML) (Elemental Fe): INTRAVENOUS | Qty: 17 | Status: AC

## 2022-03-29 NOTE — Telephone Encounter (Signed)
Spoke with pt and she is aware of recommendations per Dr. Henrene Pastor. Order in epic for barium esophagram. Pt knows she will be contacted by rad scheduling to schedule this appt.

## 2022-03-29 NOTE — Telephone Encounter (Signed)
Inbound call from paitens husband stating she is having a lot of pain and discomfort. Please advise.

## 2022-03-29 NOTE — Telephone Encounter (Signed)
Pt states she had EGD with Dil last week and it has not helped. States when she eats or drinks anything she is having horrible pain and discomfort. Pt states she was told she had a mild-moderate hernia and she is asking if perhaps having something done for the hernia would help because the protonix is not helping, she states she is taking '80mg'$  BID. Please advise.

## 2022-03-29 NOTE — Telephone Encounter (Signed)
1.  Nothing needs to be done with the hiatal hernia.  It is small and uncomplicated.  She asked this at the time of her endoscopy, multiple times 2.  Pantoprazole 40 mg twice daily should be adequate.  Not 80 mg twice daily 3.  Given ongoing dysphagia, please order barium esophagram with tablet "persistent dysphagia, evaluate"

## 2022-03-31 ENCOUNTER — Encounter: Payer: Self-pay | Admitting: Internal Medicine

## 2022-04-01 ENCOUNTER — Ambulatory Visit: Payer: Medicare Other | Admitting: Physician Assistant

## 2022-04-05 ENCOUNTER — Other Ambulatory Visit (HOSPITAL_COMMUNITY)
Admission: RE | Admit: 2022-04-05 | Discharge: 2022-04-05 | Disposition: A | Discharge status: Home / Self Care | Payer: Medicare Other | Source: Ambulatory Visit | Attending: Nurse Practitioner | Admitting: Nurse Practitioner

## 2022-04-05 ENCOUNTER — Ambulatory Visit: Payer: Medicare Other | Admitting: Nurse Practitioner

## 2022-04-05 ENCOUNTER — Ambulatory Visit: Payer: Medicare Other

## 2022-04-05 ENCOUNTER — Encounter: Payer: Self-pay | Admitting: Family Medicine

## 2022-04-05 ENCOUNTER — Ambulatory Visit (INDEPENDENT_AMBULATORY_CARE_PROVIDER_SITE_OTHER): Payer: Medicare Other | Admitting: Family Medicine

## 2022-04-05 VITALS — BP 124/72 | HR 106 | Temp 97.3°F | Ht 62.0 in | Wt 189.0 lb

## 2022-04-05 DIAGNOSIS — R8290 Unspecified abnormal findings in urine: Secondary | ICD-10-CM | POA: Insufficient documentation

## 2022-04-05 DIAGNOSIS — Z114 Encounter for screening for human immunodeficiency virus [HIV]: Secondary | ICD-10-CM

## 2022-04-05 DIAGNOSIS — Z202 Contact with and (suspected) exposure to infections with a predominantly sexual mode of transmission: Secondary | ICD-10-CM | POA: Insufficient documentation

## 2022-04-05 LAB — URINALYSIS, ROUTINE W REFLEX MICROSCOPIC
Bilirubin Urine: NEGATIVE
Hgb urine dipstick: NEGATIVE
Ketones, ur: NEGATIVE
Leukocytes,Ua: NEGATIVE
Nitrite: NEGATIVE
RBC / HPF: NONE SEEN (ref 0–?)
Specific Gravity, Urine: 1.01 (ref 1.000–1.030)
Total Protein, Urine: NEGATIVE
Urine Glucose: NEGATIVE
Urobilinogen, UA: 2 — AB (ref 0.0–1.0)
pH: 6 (ref 5.0–8.0)

## 2022-04-05 NOTE — Progress Notes (Signed)
Established Patient Office Visit  Subjective   Patient ID: Katherine [name], female    DOB: 1950/12/01  Age: 71 y.o. MRN: [id number]  Chief Complaint  Patient presents with   Urinary Tract Infection    Possible UTI discomfort with urinating.     Urinary Tract Infection  Pertinent negatives include no chills, frequency, hematuria or urgency.   evaluation of the 2 to 3-day history of darkening of her urine with mild discomfort when urinating.  Denies frequency urgency or hematuria.  Denies vaginal discharge.  Reports malaise.  There has been no fevers or chills.  She has a history of tachycardia treated with propranolol as needed.  She tells about her husband's ongoing infidelity.  She was last active sexually with him 3 weeks ago.  History of unspecified dementia.  History of Parkinson's disease.  She is taking Nuplazid.    Review of Systems  Constitutional:  Negative for chills, diaphoresis, malaise/fatigue and weight loss.  HENT: Negative.    Eyes: Negative.  Negative for blurred vision and double vision.  Cardiovascular:  Negative for chest pain.  Gastrointestinal:  Negative for abdominal pain.  Genitourinary: Negative.  Negative for dysuria, frequency, hematuria and urgency.  Musculoskeletal:  Negative for falls and myalgias.  Neurological:  Negative for speech change, loss of consciousness and weakness.  Psychiatric/Behavioral: Negative.        Objective:     BP 124/72 (BP Location: Left Arm, Patient Position: Sitting, Cuff Size: Large)   Pulse (!) 106   Temp (!) 97.3 F (36.3 C) (Temporal)   Ht '5\' 2"'$  (1.575 m)   Wt 189 lb (85.7 kg)   SpO2 95%   BMI 34.57 kg/m    Physical Exam Constitutional:      General: She is not in acute distress.    Appearance: Normal appearance. She is not ill-appearing, toxic-appearing or diaphoretic.  HENT:     Head: Normocephalic and atraumatic.     Right Ear: External ear normal.     Left Ear: External ear normal.      Mouth/Throat:     Mouth: Mucous membranes are moist.     Pharynx: Oropharynx is clear. No oropharyngeal exudate or posterior oropharyngeal erythema.  Eyes:     General: No scleral icterus.       Right eye: No discharge.        Left eye: No discharge.     Extraocular Movements: Extraocular movements intact.     Conjunctiva/sclera: Conjunctivae normal.     Pupils: Pupils are equal, round, and reactive to light.  Cardiovascular:     Rate and Rhythm: Normal rate and regular rhythm.  Pulmonary:     Effort: Pulmonary effort is normal. No respiratory distress.     Breath sounds: Normal breath sounds.  Abdominal:     General: Bowel sounds are normal.     Tenderness: There is no abdominal tenderness. There is no right CVA tenderness, left CVA tenderness or guarding.     Comments: No supra pubic tenderness to palpation.  Musculoskeletal:     Cervical back: No rigidity or tenderness.     Lumbar back: No tenderness or bony tenderness.  Skin:    General: Skin is warm and dry.  Neurological:     Mental Status: She is alert and oriented to person, place, and time.  Psychiatric:        Mood and Affect: Mood normal.        Behavior: Behavior normal.  No results found for any visits on 04/05/22.    The 10-year ASCVD risk score (Arnett DK, et al., 2019) is: 12%    Assessment & Plan:   Problem List Items Addressed This Visit       Other   Encounter for screening for human immunodeficiency virus (HIV)   Relevant Orders   HIV Antibody (routine testing w rflx)   Abnormal urine - Primary   Relevant Orders   Urinalysis, Routine w reflex microscopic   Urine Culture   Exposure to STD   Relevant Orders   Urine cytology ancillary only   HIV Antibody (routine testing w rflx)   RPR    Return Schedule follow-up with primary provider.Libby Maw, MD

## 2022-04-06 ENCOUNTER — Encounter: Payer: Self-pay | Admitting: Family Medicine

## 2022-04-06 ENCOUNTER — Ambulatory Visit: Payer: Medicare Other

## 2022-04-06 LAB — URINE CYTOLOGY ANCILLARY ONLY
Chlamydia: NEGATIVE
Comment: NEGATIVE
Comment: NEGATIVE
Comment: NORMAL
Neisseria Gonorrhea: NEGATIVE
Trichomonas: NEGATIVE

## 2022-04-06 LAB — URINE CULTURE
MICRO NUMBER:: 13931200
SPECIMEN QUALITY:: ADEQUATE

## 2022-04-06 LAB — HIV ANTIBODY (ROUTINE TESTING W REFLEX): HIV 1&2 Ab, 4th Generation: NONREACTIVE

## 2022-04-06 LAB — RPR: RPR Ser Ql: NONREACTIVE

## 2022-04-06 MED FILL — Ferumoxytol Inj 510 MG/17ML (30 MG/ML) (Elemental Fe): INTRAVENOUS | Qty: 17 | Status: AC

## 2022-04-07 ENCOUNTER — Inpatient Hospital Stay: Payer: Medicare Other

## 2022-04-07 ENCOUNTER — Ambulatory Visit: Payer: Medicare Other | Admitting: Family Medicine

## 2022-04-08 ENCOUNTER — Other Ambulatory Visit: Payer: Self-pay | Admitting: Cardiovascular Disease

## 2022-04-10 ENCOUNTER — Other Ambulatory Visit: Payer: Self-pay | Admitting: Cardiovascular Disease

## 2022-04-10 DIAGNOSIS — I10 Essential (primary) hypertension: Secondary | ICD-10-CM

## 2022-04-10 DIAGNOSIS — R079 Chest pain, unspecified: Secondary | ICD-10-CM

## 2022-04-12 DIAGNOSIS — R000 Tachycardia, unspecified: Secondary | ICD-10-CM | POA: Diagnosis not present

## 2022-04-13 ENCOUNTER — Ambulatory Visit: Payer: Medicare Other | Admitting: Primary Care

## 2022-04-13 ENCOUNTER — Ambulatory Visit (HOSPITAL_COMMUNITY)
Admission: RE | Admit: 2022-04-13 | Discharge: 2022-04-13 | Disposition: A | Discharge status: Home / Self Care | Payer: Medicare Other | Source: Ambulatory Visit | Attending: Internal Medicine | Admitting: Internal Medicine

## 2022-04-13 ENCOUNTER — Encounter: Payer: Self-pay | Admitting: Internal Medicine

## 2022-04-13 DIAGNOSIS — R1310 Dysphagia, unspecified: Secondary | ICD-10-CM | POA: Insufficient documentation

## 2022-04-13 DIAGNOSIS — K219 Gastro-esophageal reflux disease without esophagitis: Secondary | ICD-10-CM | POA: Diagnosis not present

## 2022-04-14 ENCOUNTER — Inpatient Hospital Stay: Payer: Medicare Other | Attending: Internal Medicine

## 2022-04-14 VITALS — BP 145/80 | HR 83 | Temp 98.0°F | Resp 18

## 2022-04-14 DIAGNOSIS — Z79899 Other long term (current) drug therapy: Secondary | ICD-10-CM | POA: Diagnosis not present

## 2022-04-14 DIAGNOSIS — E611 Iron deficiency: Secondary | ICD-10-CM | POA: Diagnosis not present

## 2022-04-14 MED ORDER — SODIUM CHLORIDE 0.9 % IV SOLN
510.0000 mg | Freq: Once | INTRAVENOUS | Status: AC
  Administered 2022-04-14: 510 mg via INTRAVENOUS
  Filled 2022-04-14: qty 510

## 2022-04-14 MED ORDER — SODIUM CHLORIDE 0.9 % IV SOLN
Freq: Once | INTRAVENOUS | Status: AC
  Filled 2022-04-14: qty 250

## 2022-04-20 ENCOUNTER — Ambulatory Visit: Payer: Medicare Other | Admitting: Primary Care

## 2022-04-21 ENCOUNTER — Inpatient Hospital Stay: Payer: Medicare Other

## 2022-04-22 MED FILL — Ferumoxytol Inj 510 MG/17ML (30 MG/ML) (Elemental Fe): INTRAVENOUS | Qty: 17 | Status: AC

## 2022-04-23 ENCOUNTER — Ambulatory Visit: Payer: Medicare Other

## 2022-04-23 ENCOUNTER — Inpatient Hospital Stay: Payer: Medicare Other

## 2022-04-27 ENCOUNTER — Ambulatory Visit: Payer: Medicare Other | Admitting: Internal Medicine

## 2022-04-28 ENCOUNTER — Ambulatory Visit: Payer: Medicare Other | Admitting: Internal Medicine

## 2022-04-28 ENCOUNTER — Telehealth: Payer: Self-pay | Admitting: Internal Medicine

## 2022-04-28 ENCOUNTER — Other Ambulatory Visit: Payer: Self-pay | Admitting: Primary Care

## 2022-04-28 DIAGNOSIS — K219 Gastro-esophageal reflux disease without esophagitis: Secondary | ICD-10-CM

## 2022-04-28 NOTE — Telephone Encounter (Signed)
Good Morning Dr. Henrene Pastor,  Patients husband called stating that patient would not be able to make it to her appointment with you today at 10:00 due to not feeling well.   Patient was rescheduled for 12/13 at 11:20

## 2022-04-29 ENCOUNTER — Telehealth: Payer: Self-pay | Admitting: Internal Medicine

## 2022-04-29 ENCOUNTER — Inpatient Hospital Stay: Payer: Medicare Other | Attending: Internal Medicine

## 2022-04-29 VITALS — BP 152/70 | HR 100 | Temp 96.7°F | Resp 18

## 2022-04-29 DIAGNOSIS — E611 Iron deficiency: Secondary | ICD-10-CM | POA: Insufficient documentation

## 2022-04-29 DIAGNOSIS — R3911 Hesitancy of micturition: Secondary | ICD-10-CM | POA: Diagnosis not present

## 2022-04-29 DIAGNOSIS — N312 Flaccid neuropathic bladder, not elsewhere classified: Secondary | ICD-10-CM | POA: Diagnosis not present

## 2022-04-29 DIAGNOSIS — Z79899 Other long term (current) drug therapy: Secondary | ICD-10-CM | POA: Insufficient documentation

## 2022-04-29 MED ORDER — SODIUM CHLORIDE 0.9 % IV SOLN
Freq: Once | INTRAVENOUS | Status: AC
  Filled 2022-04-29: qty 250

## 2022-04-29 MED ORDER — SODIUM CHLORIDE 0.9 % IV SOLN
510.0000 mg | Freq: Once | INTRAVENOUS | Status: AC
  Administered 2022-04-29: 510 mg via INTRAVENOUS
  Filled 2022-04-29: qty 510

## 2022-04-29 NOTE — Telephone Encounter (Signed)
Inbound call from patient requesting a refill on medication "Pantoprazole 40 mg" states pharmacy wont refill it until next month and she is complete out of it She  is schedule for  OV 12//13/2023.Please advise

## 2022-04-29 NOTE — Patient Instructions (Signed)
[facility]  Discharge Instructions: Thank you for choosing [facility] to provide your oncology and hematology care.  If you have a lab appointment with the Cancer Center, please go directly to the Cancer Center and check in at the registration area.  Wear comfortable clothing and clothing appropriate for easy access to any Portacath or PICC line.   We strive to give you quality time with your provider. You may need to reschedule your appointment if you arrive late (15 or more minutes).  Arriving late affects you and other patients whose appointments are after yours.  Also, if you miss three or more appointments without notifying the office, you may be dismissed from the clinic at the provider's discretion.      For prescription refill requests, have your pharmacy contact our office and allow 72 hours for refills to be completed.    Today you received the following chemotherapy and/or immunotherapy agents venofer      To help prevent nausea and vomiting after your treatment, we encourage you to take your nausea medication as directed.  BELOW ARE SYMPTOMS THAT SHOULD BE REPORTED IMMEDIATELY: *FEVER GREATER THAN 100.4 F (38 C) OR HIGHER *CHILLS OR SWEATING *NAUSEA AND VOMITING THAT IS NOT CONTROLLED WITH YOUR NAUSEA MEDICATION *UNUSUAL SHORTNESS OF BREATH *UNUSUAL BRUISING OR BLEEDING *URINARY PROBLEMS (pain or burning when urinating, or frequent urination) *BOWEL PROBLEMS (unusual diarrhea, constipation, pain near the anus) TENDERNESS IN MOUTH AND THROAT WITH OR WITHOUT PRESENCE OF ULCERS (sore throat, sores in mouth, or a toothache) UNUSUAL RASH, SWELLING OR PAIN  UNUSUAL VAGINAL DISCHARGE OR ITCHING   Items with * indicate a potential emergency and should be followed up as soon as possible or go to the Emergency Department if any problems should occur.  Please show the CHEMOTHERAPY ALERT CARD or IMMUNOTHERAPY ALERT CARD at check-in to the  Emergency Department and triage nurse.  Should you have questions after your visit or need to cancel or reschedule your appointment, please contact [facility]  [phone] and follow the prompts.  Office hours are 8:00 a.m. to 4:30 p.m. Monday - Friday. Please note that voicemails left after 4:00 p.m. may not be returned until the following business day.  We are closed weekends and major holidays. You have access to a nurse at all times for urgent questions. Please call the main number to the clinic [phone] and follow the prompts.  For any non-urgent questions, you may also contact your provider using MyChart. We now offer e-Visits for anyone 18 and older to request care online for non-urgent symptoms. For details visit [email or web address].   Also download the MyChart app! Go to the app store, search "MyChart", open the app, select , and log in with your MyChart username and password.  Masks are optional in the cancer centers. If you would like for your care team to wear a mask while they are taking care of you, please let them know. For doctor visits, patients may have with them one support person who is at least 70 years old. At this time, visitors are not allowed in the infusion area.   

## 2022-04-30 ENCOUNTER — Ambulatory Visit: Payer: Medicare Other | Admitting: Primary Care

## 2022-04-30 DIAGNOSIS — R000 Tachycardia, unspecified: Secondary | ICD-10-CM | POA: Diagnosis not present

## 2022-04-30 MED ORDER — PANTOPRAZOLE SODIUM 40 MG PO TBEC: 40.0000 mg | DELAYED_RELEASE_TABLET | Freq: Two times a day (BID) | ORAL | 4 refills | Status: DC

## 2022-04-30 NOTE — Telephone Encounter (Signed)
Refilled Pantoprazole 

## 2022-05-04 ENCOUNTER — Ambulatory Visit (INDEPENDENT_AMBULATORY_CARE_PROVIDER_SITE_OTHER): Payer: Medicare Other | Admitting: Primary Care

## 2022-05-04 ENCOUNTER — Encounter: Payer: Self-pay | Admitting: Primary Care

## 2022-05-04 VITALS — BP 132/80 | HR 110 | Temp 98.2°F | Ht 62.0 in | Wt 190.0 lb

## 2022-05-04 DIAGNOSIS — Z87440 Personal history of urinary (tract) infections: Secondary | ICD-10-CM | POA: Diagnosis not present

## 2022-05-04 DIAGNOSIS — G3183 Dementia with Lewy bodies: Secondary | ICD-10-CM | POA: Diagnosis not present

## 2022-05-04 DIAGNOSIS — F02818 Dementia in other diseases classified elsewhere, unspecified severity, with other behavioral disturbance: Secondary | ICD-10-CM | POA: Diagnosis not present

## 2022-05-04 DIAGNOSIS — R350 Frequency of micturition: Secondary | ICD-10-CM

## 2022-05-04 DIAGNOSIS — R440 Auditory hallucinations: Secondary | ICD-10-CM | POA: Diagnosis not present

## 2022-05-04 DIAGNOSIS — F22 Delusional disorders: Secondary | ICD-10-CM | POA: Diagnosis not present

## 2022-05-04 DIAGNOSIS — R441 Visual hallucinations: Secondary | ICD-10-CM | POA: Diagnosis not present

## 2022-05-04 LAB — POC URINALSYSI DIPSTICK (AUTOMATED)
Bilirubin, UA: NEGATIVE
Blood, UA: NEGATIVE
Glucose, UA: NEGATIVE
Ketones, UA: NEGATIVE
Nitrite, UA: NEGATIVE
Protein, UA: POSITIVE — AB
Spec Grav, UA: 1.015 (ref 1.010–1.025)
Urobilinogen, UA: 1 E.U./dL
pH, UA: 5.5 (ref 5.0–8.0)

## 2022-05-04 NOTE — Progress Notes (Signed)
Subjective:    Patient ID: Katherine [name], female    DOB: April 02, 1951, 71 y.o.   MRN: [id number]  Urinary Frequency  Associated symptoms include frequency and urgency.    Katherine [name] is a very pleasant 71 y.o. female with a history of recurrent UTI, frontotemporal dementia, Parkinson's disease, OSA, chronic back pain, restless leg syndrome, anxiety and depression presents today to discuss urinary frequency.  Symptom onset three days ago with increased urinary urgency and frequency, also with dysuria. She has a history of recurrent UTI, follows with Urology, last visit was in Summer 2023.  She has increased her intake of water and liquids.   BP Readings from Last 3 Encounters:  05/04/22 132/80  04/29/22 (!) 152/70  04/14/22 (!) 145/80        Review of Systems  Constitutional:  Negative for fever.  Gastrointestinal:  Negative for abdominal pain.  Genitourinary:  Positive for dysuria, frequency and urgency. Negative for vaginal discharge.         Past Medical History:  Diagnosis Date   Anemia 2018   Anxiety    Arthritis    Borderline diabetes    Cataract    Chest pain    a. 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 [facility]   Depression    Dyspnea on exertion    a. 03/2009 Echo: EF 65%, no rwma, triv TR.   GERD (gastroesophageal reflux disease)    High cholesterol    Hypertension    Hypokalemia    Migraine headache    Obesity    Parkinson's disease    Pre-diabetes    okay now    Tachycardia     Social History   Socioeconomic History   Marital status: Married    Spouse name: Not on file   Number of children: 1   Years of education: Not on file   Highest education level: Not on file  Occupational History   Occupation: Product manager: [employer]    Comment:  retired-special needs teacher  Tobacco Use   Smoking status: Never   Smokeless tobacco: Never  Vaping Use   Vaping Use: Never used  Substance and Sexual Activity   Alcohol use: No   Drug use: No   Sexual activity: Yes    Partners: Male    Birth control/protection: Post-menopausal  Other Topics Concern   Not on file  Social History Narrative   Married.   One son is 44, lives in [place].   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   Enjoys substitute teaching, gardening, cooking.   Social Determinants of Health   Financial Resource Strain: Low Risk  (07/09/2019)   Overall Financial Resource Strain (CARDIA)    Difficulty of Paying Living Expenses: Not hard at all  Food Insecurity: No Food Insecurity (07/09/2019)   Hunger Vital Sign    Worried About Running Out of Food in the Last Year: Never true    Ran Out of Food in the Last Year: Never true  Transportation Needs: No Transportation Needs (07/09/2019)   PRAPARE - Hydrologist (Medical): No    Lack of Transportation (Non-Medical): No  Physical Activity: Inactive (07/09/2019)   Exercise Vital Sign    Days of Exercise per Week: 0 days    Minutes  of Exercise per Session: 0 min  Stress: Stress Concern Present (07/09/2019)   [place]    Feeling of Stress : To some extent  Social Connections: Not on file  Intimate Partner Violence: Not At Risk (07/09/2019)   Humiliation, Afraid, Rape, and Kick questionnaire    Fear of Current or Ex-Partner: No    Emotionally Abused: No    Physically Abused: No    Sexually Abused: No    Past Surgical History:  Procedure Laterality Date   BLADDER SUSPENSION  2005   CARDIAC CATHETERIZATION     CESAREAN SECTION     CLOSED REDUCTION NASAL FRACTURE N/A 01/11/2020   Procedure: CLOSED REDUCTION NASAL FRACTURE;  Surgeon: Daneen Gust, MD;  Location: [facility];  Service: ENT;  Laterality: N/A;    ENDOMETRIAL ABLATION  2007   FOOT SURGERY     bilateral bunions   LUMBAR LAMINECTOMY/DECOMPRESSION MICRODISCECTOMY Bilateral 01/12/2016   Procedure: Bilateral L4-5 Laminotomy/Foraminotomy;  Surgeon: Newman Pies, MD;  Location: [facility];  Service: Neurosurgery;  Laterality: Bilateral;  Bilateral L4-5 Laminotomy/Foraminotomy   TONSILLECTOMY  1971    Family History  Problem Relation Age of Onset   Heart disease Mother    Hypertension Mother    Heart failure Mother        CHF   Diabetes Mother    Cancer Mother        CERVICAL CANCER   Atrial fibrillation Mother    Heart disease Father    Diabetes Father    Rashes / Skin problems Father    Hypertension Father    Cirrhosis Father        liver disease non alcoholic   Diabetes Sister    Breast cancer Sister 81   Diabetes Mellitus II Sister    Heart disease Brother    Heart failure Brother        CHF   Diabetes Brother    Cancer Brother 44       THROAT AND NECK   Esophageal cancer Brother    Lung cancer Brother    Atrial fibrillation Brother    Cancer Maternal Grandfather    Colon cancer Neg Hx    Stomach cancer Neg Hx    Rectal cancer Neg Hx     Allergies  Allergen Reactions   Chlorine Other (See Comments)    Smell causes lightheadedness.   Dilaudid [Hydromorphone]     hallucinations   Codeine Anxiety   Darvon Anxiety   Hydrocodone Anxiety   Meperidine Anxiety   Oxycodone Anxiety   Tramadol Anxiety   Victoza [Liraglutide] Nausea Only and Other (See Comments)    And weakness    Current Outpatient Medications on File Prior to Visit  Medication Sig Dispense Refill   clobetasol (TEMOVATE) 0.05 % external solution Apply 1 application. topically 2 (two) times daily. 50 mL 0   DULoxetine (CYMBALTA) 30 MG capsule Take 30 mg by mouth daily.     DULoxetine (CYMBALTA) 60 MG capsule Take 60 mg by mouth every morning. Takes with her '30mg'$  capsule to equal 90 mg daily     gabapentin (NEURONTIN) 300 MG capsule TAKE 1  CAPSULE (300 MG TOTAL) BY MOUTH AT BEDTIME. FOR BACK PAIN. 90 capsule 2   Multiple Vitamin (MULTIVITAMIN) tablet Take 1 tablet by mouth daily.     NUPLAZID 34 MG CAPS Take 1 capsule by mouth daily.     OVER THE COUNTER MEDICATION [employer] vitamin  pantoprazole (PROTONIX) 40 MG tablet Take 1 tablet (40 mg total) by mouth 2 (two) times daily before a meal. For heartburn. 60 tablet 4   propranolol (INDERAL) 20 MG tablet TAKE 1 TABLET (20 MG TOTAL) BY MOUTH 3 (THREE) TIMES DAILY AS NEEDED. FOR HEARTRATE >110 BPM 90 tablet 2   spironolactone (ALDACTONE) 25 MG tablet TAKE 1 TABLET (25 MG TOTAL) BY MOUTH DAILY. 90 tablet 1   tamsulosin (FLOMAX) 0.4 MG CAPS capsule Take 0.4 mg by mouth daily.     topiramate (TOPAMAX) 50 MG tablet Take 1 tablet (50 mg total) by mouth daily.     famotidine (PEPCID) 20 MG tablet TAKE 1 TABLET (20 MG TOTAL) BY MOUTH DAILY. FOR HEARTBURN. (Patient not taking: Reported on 05/04/2022) 90 tablet 1   OVER THE COUNTER MEDICATION 2 tablets daily. Air Shield (Patient not taking: Reported on 03/23/2022)     sulfamethoxazole-trimethoprim (BACTRIM DS) 800-160 MG tablet Take 1 tablet by mouth 2 (two) times daily. For urinary tract infection. (Patient not taking: Reported on 03/17/2022) 10 tablet 0   SUMAtriptan (IMITREX) 100 MG tablet Take 100 mg by mouth as needed.     No current facility-administered medications on file prior to visit.    BP 132/80   Pulse (!) 110   Temp 98.2 F (36.8 C) (Temporal)   Ht '5\' 2"'$  (1.575 m)   Wt 190 lb (86.2 kg)   SpO2 97%   BMI 34.75 kg/m  Objective:   Physical Exam Cardiovascular:     Rate and Rhythm: Normal rate and regular rhythm.  Pulmonary:     Effort: Pulmonary effort is normal.     Breath sounds: Normal breath sounds.  Musculoskeletal:     Cervical back: Neck supple.  Skin:    General: Skin is warm and dry.           Assessment & Plan:   Problem List Items Addressed This Visit       Other   Urinary frequency -  Primary    History of recurrent UTI.  UA today with 1+ leuks, otherwise negative. Culture ordered and pending.  Await results prior to treatment.  Low suspicion for pyelonephritis.       Relevant Orders   Urine Culture   Other Visit Diagnoses     History of recurrent UTIs       Relevant Orders   POCT Urinalysis Dipstick (Automated) (Completed)          [name], NP

## 2022-05-04 NOTE — Patient Instructions (Signed)
We will be in touch in a few days regarding your urine culture results.   Continue to push water intake.   It was a pleasure to see you today!

## 2022-05-04 NOTE — Progress Notes (Signed)
kbur

## 2022-05-04 NOTE — Assessment & Plan Note (Signed)
History of recurrent UTI.  UA today with 1+ leuks, otherwise negative. Culture ordered and pending.  Await results prior to treatment.  Low suspicion for pyelonephritis.

## 2022-05-06 DIAGNOSIS — Z23 Encounter for immunization: Secondary | ICD-10-CM | POA: Diagnosis not present

## 2022-05-07 ENCOUNTER — Other Ambulatory Visit: Payer: Self-pay | Admitting: Primary Care

## 2022-05-07 DIAGNOSIS — N3000 Acute cystitis without hematuria: Secondary | ICD-10-CM

## 2022-05-07 LAB — URINE CULTURE
MICRO NUMBER:: 14061850
SPECIMEN QUALITY:: ADEQUATE

## 2022-05-07 MED ORDER — SULFAMETHOXAZOLE-TRIMETHOPRIM 800-160 MG PO TABS: 1.0000 | ORAL_TABLET | Freq: Two times a day (BID) | ORAL | 0 refills | Status: DC

## 2022-05-11 ENCOUNTER — Encounter: Payer: Self-pay | Admitting: Internal Medicine

## 2022-05-19 ENCOUNTER — Encounter: Payer: Self-pay | Admitting: Cardiovascular Disease

## 2022-05-19 ENCOUNTER — Telehealth: Payer: Self-pay

## 2022-05-19 DIAGNOSIS — G20A1 Parkinson's disease without dyskinesia, without mention of fluctuations: Secondary | ICD-10-CM

## 2022-05-19 DIAGNOSIS — R296 Repeated falls: Secondary | ICD-10-CM

## 2022-05-19 NOTE — Telephone Encounter (Signed)
Called patient and discussed result note with her. She stated that since she has worn the heart monitor her HR has been running higher. When it is over 110 she is taking the extra dose of propranolol as instructed. Because she has been having to do this more often, patient wants to know if Dr. Rockey Situ wants her to come back in and be seen.   I informed her that I would certainly send the information to Dr. Rockey Situ for her for review.  Patient was very grateful for the follow up.

## 2022-05-19 NOTE — Telephone Encounter (Signed)
-----   Message from Minna Merritts, MD sent at 05/18/2022  8:00 AM EDT ----- Event monitor Significant artifact noted Of the time analyzed, average heart rate 99 bpm, no other significant arrhythmia No significant bradycardia noted

## 2022-05-21 ENCOUNTER — Telehealth: Payer: Self-pay | Admitting: Cardiovascular Disease

## 2022-05-21 ENCOUNTER — Telehealth: Payer: Self-pay | Admitting: Internal Medicine

## 2022-05-21 NOTE — Telephone Encounter (Signed)
Patient's husband [name] called states patient pantoprazole 40 mg medication instructions are wrong. States patient supposed to take 2 tablets in the morning and 2 in the afternoon. Requesting an update on medication instruction.

## 2022-05-21 NOTE — Telephone Encounter (Signed)
[place] [employer] paper work retrieved from nurse bin for Dr. Rockey Situ.  Dr. Rockey Situ to complete the cardiology section of the form.   Placed on Dr. Donivan Scull desk for review and signature.

## 2022-05-21 NOTE — Telephone Encounter (Signed)
Spoke with Katherine [name] and clarified that patient should take Protonix twice a day - usually before breakfast and before dinner.  He agreed

## 2022-05-24 NOTE — Telephone Encounter (Signed)
.  Type of forms received:[facility] medical review form  Routed DH:WYSHU Pool  Paperwork received by : Gwynn Burly   Individual made aware of 3-5 business day turn around (Y/N): Y  Form completed and patient made aware of charges(Y/N): Y   Faxed to :   Form location: Place in PCP folder

## 2022-05-24 NOTE — Telephone Encounter (Signed)
Left a message for the patient to call back.   Minna Merritts, MD  [facility] hours ago (11:12 AM)    Would recommend something for heart rate on a more regular basis other than propranolol as needed Suggest start metoprolol tartrate 25 twice daily And still use propranolol as needed for heart rate more than 110 Thx TGollan

## 2022-05-25 ENCOUNTER — Telehealth: Payer: Self-pay | Admitting: Cardiovascular Disease

## 2022-05-25 ENCOUNTER — Ambulatory Visit: Payer: Medicare Other

## 2022-05-25 MED ORDER — METOPROLOL TARTRATE 25 MG PO TABS: 25.0000 mg | ORAL_TABLET | Freq: Two times a day (BID) | ORAL | 2 refills | Status: DC

## 2022-05-25 NOTE — Telephone Encounter (Signed)
Please see previous phone note.  

## 2022-05-25 NOTE — Telephone Encounter (Signed)
Spoke w/ pt and advised her of Dr. Donivan Scull recommendation.  She verbalizes understanding and is agreeable.  Asked her to continue to monitor BP and HR, change positions slowly, and let us know how she feels.  She is appreciative of the call.

## 2022-05-25 NOTE — Telephone Encounter (Signed)
Completed and placed upfront for pickup.

## 2022-05-25 NOTE — Telephone Encounter (Signed)
Form placed in [place] box for completion.

## 2022-05-25 NOTE — Telephone Encounter (Signed)
Patient was returning phone call 

## 2022-05-25 NOTE — Telephone Encounter (Signed)
Left message for pt to call back  °

## 2022-05-25 NOTE — Addendum Note (Signed)
Addended by: Dede Query R on: 05/25/2022 05:03 PM   Modules accepted: Orders

## 2022-05-26 DIAGNOSIS — H04123 Dry eye syndrome of bilateral lacrimal glands: Secondary | ICD-10-CM | POA: Diagnosis not present

## 2022-05-26 DIAGNOSIS — H35033 Hypertensive retinopathy, bilateral: Secondary | ICD-10-CM | POA: Diagnosis not present

## 2022-05-26 DIAGNOSIS — H43812 Vitreous degeneration, left eye: Secondary | ICD-10-CM | POA: Diagnosis not present

## 2022-05-26 DIAGNOSIS — Z961 Presence of intraocular lens: Secondary | ICD-10-CM | POA: Diagnosis not present

## 2022-05-27 DIAGNOSIS — F0280 Dementia in other diseases classified elsewhere without behavioral disturbance: Secondary | ICD-10-CM

## 2022-05-27 DIAGNOSIS — F32A Depression, unspecified: Secondary | ICD-10-CM

## 2022-06-16 ENCOUNTER — Ambulatory Visit: Payer: Medicare Other | Admitting: Primary Care

## 2022-06-18 ENCOUNTER — Ambulatory Visit (INDEPENDENT_AMBULATORY_CARE_PROVIDER_SITE_OTHER): Payer: Medicare Other | Admitting: Primary Care

## 2022-06-18 VITALS — BP 120/72 | HR 97 | Temp 97.9°F | Ht 62.0 in | Wt 188.0 lb

## 2022-06-18 DIAGNOSIS — E559 Vitamin D deficiency, unspecified: Secondary | ICD-10-CM | POA: Diagnosis not present

## 2022-06-18 DIAGNOSIS — G3183 Dementia with Lewy bodies: Secondary | ICD-10-CM | POA: Diagnosis not present

## 2022-06-18 DIAGNOSIS — E538 Deficiency of other specified B group vitamins: Secondary | ICD-10-CM | POA: Diagnosis not present

## 2022-06-18 DIAGNOSIS — F0280 Dementia in other diseases classified elsewhere without behavioral disturbance: Secondary | ICD-10-CM | POA: Diagnosis not present

## 2022-06-18 DIAGNOSIS — G3109 Other frontotemporal dementia: Secondary | ICD-10-CM

## 2022-06-18 DIAGNOSIS — R5382 Chronic fatigue, unspecified: Secondary | ICD-10-CM | POA: Diagnosis not present

## 2022-06-18 DIAGNOSIS — R238 Other skin changes: Secondary | ICD-10-CM

## 2022-06-18 DIAGNOSIS — R2689 Other abnormalities of gait and mobility: Secondary | ICD-10-CM | POA: Diagnosis not present

## 2022-06-18 DIAGNOSIS — D509 Iron deficiency anemia, unspecified: Secondary | ICD-10-CM | POA: Diagnosis not present
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
## 2022-06-18 NOTE — Addendum Note (Signed)
Addended by: Carter Kitten on: 06/18/2022 04:45 PM   Modules accepted: Orders

## 2022-06-18 NOTE — Assessment & Plan Note (Signed)
Labs pending today for iron studies, vitamin levels.  TSH reviewed from 2023.

## 2022-06-18 NOTE — Patient Instructions (Addendum)
You will either be contacted via phone regarding your referral to Dr. Krista Blue through Neurology, or you may receive a letter on your MyChart portal from our referral team with instructions for scheduling an appointment. Please let us know if you have not been contacted by anyone within two weeks.  Call [facility] at [phone] as discussed.   Stop by the lab prior to leaving today. I will notify you of your results once received.   It was a pleasure to see you today!

## 2022-06-18 NOTE — Assessment & Plan Note (Signed)
Not on supplementation, repeat level pending.

## 2022-06-18 NOTE — Assessment & Plan Note (Signed)
Phone number provided to patient to schedule her visit for PT.

## 2022-06-18 NOTE — Addendum Note (Signed)
Addended by: Francella Solian on: 06/18/2022 02:34 PM   Modules accepted: Orders

## 2022-06-18 NOTE — Assessment & Plan Note (Signed)
Not taking supplements. Repeat vitamin D level pending.

## 2022-06-18 NOTE — Assessment & Plan Note (Signed)
Noted several scalp scabs on exam today.  Continue to follow up with dermatology.
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
## 2022-06-19 LAB — CBC WITH DIFFERENTIAL/PLATELET
Absolute Monocytes: 885 cells/uL (ref 200–950)
Basophils Absolute: 59 cells/uL (ref 0–200)
Basophils Relative: 0.5 %
Eosinophils Absolute: 366 cells/uL (ref 15–500)
Eosinophils Relative: 3.1 %
HCT: 43.3 % (ref 35.0–45.0)
Hemoglobin: 14.8 g/dL (ref 11.7–15.5)
Lymphs Abs: 2266 cells/uL (ref 850–3900)
MCH: 31.4 pg (ref 27.0–33.0)
MCHC: 34.2 g/dL (ref 32.0–36.0)
MCV: 91.7 fL (ref 80.0–100.0)
MPV: 10.8 fL (ref 7.5–12.5)
Monocytes Relative: 7.5 %
Neutro Abs: 8225 cells/uL — ABNORMAL HIGH (ref 1500–7800)
Neutrophils Relative %: 69.7 %
Platelets: 302 10*3/uL (ref 140–400)
RBC: 4.72 10*6/uL (ref 3.80–5.10)
RDW: 14.4 % (ref 11.0–15.0)
Total Lymphocyte: 19.2 %
WBC: 11.8 10*3/uL — ABNORMAL HIGH (ref 3.8–10.8)

## 2022-06-19 LAB — IRON,TIBC AND FERRITIN PANEL
%SAT: 22 % (calc) (ref 16–45)
Ferritin: 222 ng/mL (ref 16–288)
Iron: 58 ug/dL (ref 45–160)
TIBC: 266 mcg/dL (calc) (ref 250–450)

## 2022-06-19 LAB — VITAMIN B12: Vitamin B-12: 287 pg/mL (ref 200–1100)

## 2022-06-19 LAB — VITAMIN D 25 HYDROXY (VIT D DEFICIENCY, FRACTURES): Vit D, 25-Hydroxy: 14 ng/mL — ABNORMAL LOW (ref 30–100)

## 2022-06-20 ENCOUNTER — Other Ambulatory Visit: Payer: Self-pay | Admitting: Primary Care

## 2022-06-20 DIAGNOSIS — N390 Urinary tract infection, site not specified: Secondary | ICD-10-CM

## 2022-06-20 DIAGNOSIS — D72829 Elevated white blood cell count, unspecified: Secondary | ICD-10-CM

## 2022-06-21 ENCOUNTER — Telehealth: Payer: Self-pay | Admitting: Primary Care

## 2022-06-21 ENCOUNTER — Other Ambulatory Visit: Payer: Self-pay | Admitting: Primary Care

## 2022-06-21 DIAGNOSIS — E559 Vitamin D deficiency, unspecified: Secondary | ICD-10-CM

## 2022-06-21 MED ORDER — VITAMIN D (ERGOCALCIFEROL) 1.25 MG (50000 UNIT) PO CAPS: ORAL_CAPSULE | ORAL | 0 refills | Status: DC

## 2022-06-21 NOTE — Telephone Encounter (Signed)
See result note for further documentation.

## 2022-06-21 NOTE — Telephone Encounter (Signed)
Pt called back returning Kelli's missed call. Pt requested a call back @ [phone]

## 2022-06-23 ENCOUNTER — Encounter: Payer: Self-pay | Admitting: Internal Medicine

## 2022-06-23 DIAGNOSIS — L218 Other seborrheic dermatitis: Secondary | ICD-10-CM | POA: Diagnosis not present

## 2022-06-24 ENCOUNTER — Ambulatory Visit: Payer: Medicare Other | Admitting: Primary Care

## 2022-06-24 ENCOUNTER — Other Ambulatory Visit (INDEPENDENT_AMBULATORY_CARE_PROVIDER_SITE_OTHER): Payer: Medicare Other

## 2022-06-24 DIAGNOSIS — N390 Urinary tract infection, site not specified: Secondary | ICD-10-CM | POA: Diagnosis not present

## 2022-06-24 DIAGNOSIS — D72829 Elevated white blood cell count, unspecified: Secondary | ICD-10-CM

## 2022-06-24 LAB — POC URINALSYSI DIPSTICK (AUTOMATED)
Bilirubin, UA: NEGATIVE
Blood, UA: NEGATIVE
Glucose, UA: NEGATIVE
Ketones, UA: NEGATIVE
Leukocytes, UA: NEGATIVE
Nitrite, UA: NEGATIVE
Protein, UA: NEGATIVE
Spec Grav, UA: 1.015 (ref 1.010–1.025)
Urobilinogen, UA: 0.2 E.U./dL
pH, UA: 5.5 (ref 5.0–8.0)

## 2022-06-25 LAB — URINE CULTURE
MICRO NUMBER:: 14284468
SPECIMEN QUALITY:: ADEQUATE

## 2022-06-27 DIAGNOSIS — R5382 Chronic fatigue, unspecified: Secondary | ICD-10-CM

## 2022-06-27 DIAGNOSIS — R7303 Prediabetes: Secondary | ICD-10-CM

## 2022-06-28 ENCOUNTER — Other Ambulatory Visit: Payer: Medicare Other

## 2022-06-30 ENCOUNTER — Ambulatory Visit: Payer: Medicare Other | Admitting: Internal Medicine

## 2022-07-08 ENCOUNTER — Other Ambulatory Visit: Payer: Medicare Other

## 2022-07-13 DIAGNOSIS — F0280 Dementia in other diseases classified elsewhere without behavioral disturbance: Secondary | ICD-10-CM

## 2022-07-13 DIAGNOSIS — F32A Depression, unspecified: Secondary | ICD-10-CM

## 2022-07-16 ENCOUNTER — Other Ambulatory Visit: Payer: Medicare Other

## 2022-07-16 ENCOUNTER — Ambulatory Visit: Payer: Medicare Other

## 2022-07-20 ENCOUNTER — Ambulatory Visit: Payer: Medicare Other

## 2022-07-22 ENCOUNTER — Telehealth: Payer: Self-pay | Admitting: Primary Care

## 2022-07-22 DIAGNOSIS — R5382 Chronic fatigue, unspecified: Secondary | ICD-10-CM

## 2022-07-22 DIAGNOSIS — R7303 Prediabetes: Secondary | ICD-10-CM

## 2022-07-22 NOTE — Telephone Encounter (Signed)
Noted  

## 2022-07-22 NOTE — Telephone Encounter (Signed)
Patient notified, she will check with [facility].   Patient wanted to let you know that [facility] said they weren't able to see her because they do not have a neuropsychologist on staff and that is who she would need to see. They suggested she check with her neurologist and get a referral to someone who can assist with this specific need. She said she will discuss ithw her neurologist at the end of the month at her appointment.

## 2022-07-22 NOTE — Telephone Encounter (Signed)
Pt called in would like to get her lab done at [facility] [place] on [address] /07/28/22

## 2022-07-22 NOTE — Telephone Encounter (Signed)
Noted. Lab orders changed to [facility]. Have her call the [facility] [facility] first to see if they see my orders. She doesn't need an appointment.

## 2022-07-27 ENCOUNTER — Ambulatory Visit: Payer: Medicare Other

## 2022-07-28 ENCOUNTER — Other Ambulatory Visit: Payer: Self-pay | Admitting: Primary Care

## 2022-07-28 DIAGNOSIS — R7303 Prediabetes: Secondary | ICD-10-CM | POA: Diagnosis not present

## 2022-07-28 DIAGNOSIS — R5382 Chronic fatigue, unspecified: Secondary | ICD-10-CM | POA: Diagnosis not present

## 2022-07-29 ENCOUNTER — Ambulatory Visit: Payer: Medicare Other | Admitting: Physician Assistant

## 2022-07-30 ENCOUNTER — Ambulatory Visit
Admission: RE | Admit: 2022-07-30 | Discharge: 2022-07-30 | Disposition: A | Discharge status: Home / Self Care | Payer: Medicare Other | Source: Ambulatory Visit | Attending: Primary Care | Admitting: Primary Care

## 2022-07-30 ENCOUNTER — Ambulatory Visit (INDEPENDENT_AMBULATORY_CARE_PROVIDER_SITE_OTHER): Payer: Medicare Other

## 2022-07-30 VITALS — Ht 62.0 in | Wt 188.0 lb

## 2022-07-30 DIAGNOSIS — R748 Abnormal levels of other serum enzymes: Secondary | ICD-10-CM | POA: Insufficient documentation

## 2022-07-30 DIAGNOSIS — Z0000 Encounter for general adult medical examination without abnormal findings: Secondary | ICD-10-CM | POA: Diagnosis not present

## 2022-07-30 DIAGNOSIS — R1010 Upper abdominal pain, unspecified: Secondary | ICD-10-CM | POA: Insufficient documentation

## 2022-07-30 DIAGNOSIS — R109 Unspecified abdominal pain: Secondary | ICD-10-CM | POA: Diagnosis not present

## 2022-07-30 DIAGNOSIS — K760 Fatty (change of) liver, not elsewhere classified: Secondary | ICD-10-CM | POA: Diagnosis not present

## 2022-07-30 DIAGNOSIS — K8020 Calculus of gallbladder without cholecystitis without obstruction: Secondary | ICD-10-CM | POA: Diagnosis not present

## 2022-07-30 LAB — COMPREHENSIVE METABOLIC PANEL
ALT: 10 IU/L (ref 0–32)
AST: 9 IU/L (ref 0–40)
Albumin/Globulin Ratio: 1.5 (ref 1.2–2.2)
Albumin: 4.1 g/dL (ref 3.9–4.9)
Alkaline Phosphatase: 162 IU/L — ABNORMAL HIGH (ref 44–121)
BUN/Creatinine Ratio: 13 (ref 12–28)
BUN: 12 mg/dL (ref 8–27)
Bilirubin Total: 0.2 mg/dL (ref 0.0–1.2)
CO2: 19 mmol/L — ABNORMAL LOW (ref 20–29)
Calcium: 9.9 mg/dL (ref 8.7–10.3)
Chloride: 106 mmol/L (ref 96–106)
Creatinine, Ser: 0.91 mg/dL (ref 0.57–1.00)
Globulin, Total: 2.7 g/dL (ref 1.5–4.5)
Glucose: 107 mg/dL — ABNORMAL HIGH (ref 70–99)
Potassium: 3.9 mmol/L (ref 3.5–5.2)
Sodium: 140 mmol/L (ref 134–144)
Total Protein: 6.8 g/dL (ref 6.0–8.5)
eGFR: 68 mL/min/{1.73_m2} (ref >59)

## 2022-07-30 LAB — CBC WITH DIFFERENTIAL/PLATELET
Basophils Absolute: 0.1 10*3/uL (ref 0.0–0.2)
Basos: 1 %
EOS (ABSOLUTE): 0.3 10*3/uL (ref 0.0–0.4)
Eos: 3 %
Hematocrit: 45.2 % (ref 34.0–46.6)
Hemoglobin: 15.2 g/dL (ref 11.1–15.9)
Immature Grans (Abs): 0 10*3/uL (ref 0.0–0.1)
Immature Granulocytes: 0 %
Lymphocytes Absolute: 2.1 10*3/uL (ref 0.7–3.1)
Lymphs: 19 %
MCH: 31.2 pg (ref 26.6–33.0)
MCHC: 33.6 g/dL (ref 31.5–35.7)
MCV: 93 fL (ref 79–97)
Monocytes Absolute: 0.7 10*3/uL (ref 0.1–0.9)
Monocytes: 6 %
Neutrophils Absolute: 7.9 10*3/uL — ABNORMAL HIGH (ref 1.4–7.0)
Neutrophils: 71 %
Platelets: 317 10*3/uL (ref 150–450)
RBC: 4.87 x10E6/uL (ref 3.77–5.28)
RDW: 13 % (ref 11.7–15.4)
WBC: 11.1 10*3/uL — ABNORMAL HIGH (ref 3.4–10.8)

## 2022-07-30 LAB — LYME DISEASE, WESTERN BLOT
IgG P18 Ab.: ABSENT
IgG P23 Ab.: ABSENT
IgG P28 Ab.: ABSENT
IgG P30 Ab.: ABSENT
IgG P39 Ab.: ABSENT
IgG P41 Ab.: ABSENT
IgG P45 Ab.: ABSENT
IgG P58 Ab.: ABSENT
IgG P66 Ab.: ABSENT
IgG P93 Ab.: ABSENT
IgM P23 Ab.: ABSENT
IgM P39 Ab.: ABSENT
IgM P41 Ab.: ABSENT
Lyme IgG Wb: NEGATIVE
Lyme IgM Wb: NEGATIVE

## 2022-07-30 LAB — HEMOGLOBIN A1C
Est. average glucose Bld gHb Est-mCnc: 123 mg/dL
Hgb A1c MFr Bld: 5.9 % — ABNORMAL HIGH (ref 4.8–5.6)

## 2022-07-30 NOTE — Progress Notes (Signed)
Subjective:   Katherine [name] is a 72 y.o. female who presents for Medicare Annual (Subsequent) preventive examination.  Review of Systems    No ROS.  Medicare Wellness Virtual Visit.  Visual/audio telehealth visit, UTA vital signs.   See social history for additional risk factors.   Cardiac Risk Factors include: advanced age (>61mn, >>39women);hypertension     Objective:    Today's Vitals   07/30/22 1606  Weight: 188 lb (85.3 kg)  Height: '5\' 2"'$  (1.575 m)   Body mass index is 34.39 kg/m.     07/30/2022    4:14 PM 03/17/2022    2:02 PM 12/02/2021   11:32 AM 11/11/2020   11:25 AM 01/11/2020    7:59 PM 01/11/2020    9:52 AM 01/01/2020   12:44 PM  Advanced Directives  Does Patient Have a Medical Advance Directive? No Yes No No No Yes No  Type of ACorporate treasurerof [place] will    [place]  Does patient want to make changes to medical advance directive?      No - Patient declined   Copy of [place]?  No - copy requested    No - copy requested   Would patient like information on creating a medical advance directive? No - Patient declined   No - Patient declined   No - Patient declined    Current Medications (verified) Outpatient Encounter Medications as of 07/30/2022  Medication Sig   clobetasol (TEMOVATE) 0.05 % external solution Apply 1 application. topically 2 (two) times daily.   DULoxetine (CYMBALTA) 30 MG capsule Take 30 mg by mouth daily.   DULoxetine (CYMBALTA) 60 MG capsule Take 60 mg by mouth every morning. Takes with her '30mg'$  capsule to equal 90 mg daily   famotidine (PEPCID) 20 MG tablet TAKE 1 TABLET (20 MG TOTAL) BY MOUTH DAILY. FOR HEARTBURN.   gabapentin (NEURONTIN) 300 MG capsule TAKE 1 CAPSULE (300 MG TOTAL) BY MOUTH AT BEDTIME. FOR BACK PAIN.   metoprolol tartrate (LOPRESSOR) 25 MG tablet Take 1 tablet (25 mg total) by mouth 2 (two) times daily.   Multiple Vitamin (MULTIVITAMIN)  tablet Take 1 tablet by mouth daily.   NUPLAZID 34 MG CAPS Take 1 capsule by mouth daily.   OVER THE COUNTER MEDICATION [employer] vitamin   OVER THE COUNTER MEDICATION 2 tablets daily. Air Shield   pantoprazole (PROTONIX) 40 MG tablet Take 1 tablet (40 mg total) by mouth 2 (two) times daily before a meal. For heartburn.   propranolol (INDERAL) 20 MG tablet TAKE 1 TABLET (20 MG TOTAL) BY MOUTH 3 (THREE) TIMES DAILY AS NEEDED. FOR HEARTRATE >110 BPM   spironolactone (ALDACTONE) 25 MG tablet TAKE 1 TABLET (25 MG TOTAL) BY MOUTH DAILY.   SUMAtriptan (IMITREX) 100 MG tablet Take 100 mg by mouth as needed.   tamsulosin (FLOMAX) 0.4 MG CAPS capsule Take 0.4 mg by mouth daily.   topiramate (TOPAMAX) 50 MG tablet Take 1 tablet (50 mg total) by mouth daily.   Vitamin D, Ergocalciferol, (DRISDOL) 1.25 MG (50000 UNIT) CAPS capsule Take 1 capsule by mouth once weekly for 12 weeks.   No facility-administered encounter medications on file as of 07/30/2022.    Allergies (verified) Chlorine, Dilaudid [hydromorphone], Codeine, Darvon, Hydrocodone, Meperidine, Oxycodone, Tramadol, and Victoza [liraglutide]   History: Past Medical History:  Diagnosis Date   Anemia 2018   Anxiety    Arthritis    Borderline diabetes  Cataract    Chest pain    a. 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 [facility]   Depression    Dyspnea on exertion    a. 03/2009 Echo: EF 65%, no rwma, triv TR.   GERD (gastroesophageal reflux disease)    High cholesterol    Hypertension    Hypokalemia    Migraine headache    Obesity    Parkinson's disease    Pre-diabetes    okay now    Tachycardia    Past Surgical History:  Procedure Laterality Date   BLADDER SUSPENSION  2005   CARDIAC CATHETERIZATION     CESAREAN SECTION     CLOSED REDUCTION NASAL FRACTURE  N/A 01/11/2020   Procedure: CLOSED REDUCTION NASAL FRACTURE;  Surgeon: Kordelia Gust, MD;  Location: [facility];  Service: ENT;  Laterality: N/A;   ENDOMETRIAL ABLATION  2007   FOOT SURGERY     bilateral bunions   LUMBAR LAMINECTOMY/DECOMPRESSION MICRODISCECTOMY Bilateral 01/12/2016   Procedure: Bilateral L4-5 Laminotomy/Foraminotomy;  Surgeon: Newman Pies, MD;  Location: [facility];  Service: Neurosurgery;  Laterality: Bilateral;  Bilateral L4-5 Laminotomy/Foraminotomy   TONSILLECTOMY  1971   Family History  Problem Relation Age of Onset   Heart disease Mother    Hypertension Mother    Heart failure Mother        CHF   Diabetes Mother    Cancer Mother        CERVICAL CANCER   Atrial fibrillation Mother    Heart disease Father    Diabetes Father    Rashes / Skin problems Father    Hypertension Father    Cirrhosis Father        liver disease non alcoholic   Diabetes Sister    Breast cancer Sister 49   Diabetes Mellitus II Sister    Heart disease Brother    Heart failure Brother        CHF   Diabetes Brother    Cancer Brother 12       THROAT AND NECK   Esophageal cancer Brother    Lung cancer Brother    Atrial fibrillation Brother    Cancer Maternal Grandfather    Colon cancer Neg Hx    Stomach cancer Neg Hx    Rectal cancer Neg Hx    Social History   Socioeconomic History   Marital status: Married    Spouse name: Not on file   Number of children: 1   Years of education: Not on file   Highest education level: Not on file  Occupational History   Occupation: Product manager: [employer]    Comment: retired-special needs teacher  Tobacco Use   Smoking status: Never   Smokeless tobacco: Never  Vaping Use   Vaping Use: Never used  Substance and Sexual Activity   Alcohol use: No   Drug use: No   Sexual activity: Yes    Partners: Male    Birth control/protection: Post-menopausal  Other Topics Concern   Not on file  Social History  Narrative   Married.   One son is 9, lives in [place].   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   Enjoys substitute teaching, gardening, cooking.   Social Determinants of Health   Financial Resource Strain: Low Risk  (07/30/2022)  Overall Financial Resource Strain (CARDIA)    Difficulty of Paying Living Expenses: Not hard at all  Food Insecurity: No Food Insecurity (07/30/2022)   Hunger Vital Sign    Worried About Running Out of Food in the Last Year: Never true    Ran Out of Food in the Last Year: Never true  Transportation Needs: No Transportation Needs (07/30/2022)   PRAPARE - Hydrologist (Medical): No    Lack of Transportation (Non-Medical): No  Physical Activity: Unknown (07/30/2022)   Exercise Vital Sign    Days of Exercise per Week: 0 days    Minutes of Exercise per Session: Not on file  Stress: Stress Concern Present (07/30/2022)   [place]    Feeling of Stress : To some extent  Social Connections: Unknown (07/30/2022)   Social Connection and Isolation Panel [NHANES]    Frequency of Communication with Friends and Family: Not on file    Frequency of Social Gatherings with Friends and Family: Not on file    Attends Religious Services: Not on file    Active Member of Clubs or Organizations: Not on file    Attends Archivist Meetings: Not on file    Marital Status: Married    Tobacco Counseling Counseling given: Not Answered   Clinical Intake:  Pre-visit preparation completed: Yes        Diabetes: No  How often do you need to have someone help you when you read instructions, pamphlets, or other written materials from your doctor or pharmacy?: 3 - Sometimes    Interpreter Needed?: No      Activities of Daily Living    07/30/2022    4:15 PM  In your present state of health, do you have any difficulty performing the following activities:  Hearing? 1  Vision? 0   Difficulty concentrating or making decisions? 1  Comment Dx Lewey Body Dementia.  Walking or climbing stairs? 1  Comment Walker available for use. Paces self and holds onto husband when walking.  Dressing or bathing? 1  Comment Husband assist as needed  Doing errands, shopping? 1  Comment Husband assist  Preparing Food and eating ? Y  Using the Toilet? N  In the past six months, have you accidently leaked urine? Y  Do you have problems with loss of bowel control? N  Managing your Medications? Y  Managing your Finances? Y  Housekeeping or managing your Housekeeping? Y    Patient Care Team: [name], NP as PCP - General (Nurse Practitioner) Minna Merritts, MD as PCP - Cardiology (Cardiology) Minna Merritts, MD as Consulting Physician (Cardiology) Festus Aloe, MD as Consulting Physician (Urology) [name], NP as Nurse Practitioner Charlton Haws, [facility] as Pharmacist (Pharmacist)  Indicate any recent Medical Services you may have received from other than [facility] providers in the past year (date may be approximate).     Assessment:   This is a routine wellness examination for [place].  I connected with  [name] on 07/30/22 by a audio enabled telemedicine application and verified that I am speaking with the correct person using two identifiers. Information received from husband, HIPAA.   Patient Location: Home  Provider Location: Office/Clinic  I discussed the limitations of evaluation and management by telemedicine. The patient expressed understanding and agreed to proceed.   Hearing/Vision screen Hearing Screening - Comments:: Patient is able to hear some conversational tones. Elects not to  wear hearing aids st this time.  Vision Screening - Comments:: Followed by dr. Matilde Sprang Wears reader lenses Cataract extraction, bilateral They have seen their ophthalmologist in the last 12 months.    Dietary issues and exercise activities  discussed: Current Exercise Habits: Home exercise routine, Intensity: Mild Regular diet Fair water intake   Goals Addressed             This Visit's Progress    DIET - INCREASE WATER INTAKE       Stay hydrated     Increase physical activity       Walk more for exercise in the house        Depression Screen    07/30/2022    4:13 PM 02/24/2021    4:15 PM 04/15/2020   11:45 AM 07/09/2019   12:16 PM 07/06/2018   12:39 PM  PHQ 2/9 Scores  PHQ - 2 Score 0 '1 1 6 '$ 0  PHQ- 9 Score  '10 10 9 '$ 0    Fall Risk    07/30/2022    4:15 PM 02/24/2021    4:15 PM 07/09/2019   12:15 PM 07/06/2018   12:39 PM  Fall Risk   Falls in the past year? 1 0 1 0  Comment   walking and just fell   Number falls in past yr: 0 0 1   Injury with Fall?  0 0   Risk for fall due to :   History of fall(s);Medication side effect   Follow up Falls evaluation completed;Falls prevention discussed  Falls evaluation completed;Falls prevention discussed     FALL RISK PREVENTION PERTAINING TO THE HOME: Home free of loose throw rugs in walkways, pet beds, electrical cords, etc? Yes  Adequate lighting in your home to reduce risk of falls? Yes   ASSISTIVE DEVICES UTILIZED TO PREVENT FALLS: Life alert? No  Use of a cane, walker or w/c? Owns a walker but does not use.  Grab bars in the bathroom? Yes  Shower chair or bench in shower? Yes  Elevated toilet seat or a handicapped toilet? No   TIMED UP AND GO: Was the test performed? No .   Cognitive Function:    07/09/2019   12:20 PM 07/06/2018   12:40 PM  MMSE - Mini Mental State Exam  Orientation to time 5 5  Orientation to Place 5 5  Registration 3 3  Attention/ Calculation 3 0  Recall 2 3  Language- name 2 objects  0  Language- repeat 1 1  Language- follow 3 step command  3  Language- read & follow direction  0  Write a sentence  0  Copy design  0  Total score  20        07/30/2022    4:22 PM  6CIT Screen  What Year? 0 points  What month? 0  points  What time? 0 points  Count back from 20 0 points    Immunizations Immunization History  Administered Date(s) Administered   Fluad Quad(high Dose 65+) 05/16/2019, 04/15/2020   Influenza, High Dose Seasonal PF 04/21/2021   Influenza,inj,Quad PF,6+ Mos 05/09/2015, 05/07/2016, 03/30/2017, 05/10/2018   Influenza-Unspecified 05/01/2014   Moderna Covid-19 Vaccine Bivalent Booster 47yr & up 04/30/2021   Moderna Sars-Covid-2 Vaccination 08/29/2019, 09/26/2019   Pneumococcal Polysaccharide-23 12/10/2014, 07/06/2018, 04/30/2021   Td 12/10/2014   Tdap 02/08/2020   Zoster Recombinat (Shingrix) 02/27/2019, 06/12/2019   Zoster, Live 04/12/2009   Pneumococcal vaccine status: Due, Education has been provided regarding the importance of  this vaccine. Advised may receive this vaccine at local pharmacy or Health Dept. Aware to provide a copy of the vaccination record if obtained from local pharmacy or Health Dept. Verbalized acceptance and understanding.  Screening Tests Health Maintenance  Topic Date Due   MAMMOGRAM  06/01/2020   INFLUENZA VACCINE  10/17/2022 (Originally 02/16/2022)   COLONOSCOPY (Pts 45-68yr Insurance coverage will need to be confirmed)  05/05/2023 (Originally 04/22/2022)   Pneumonia Vaccine 71 Years old (2 - PCV) 07/31/2023 (Originally 04/30/2022)   Medicare Annual Wellness (AWV)  07/31/2023   DTaP/Tdap/Td (3 - Td or Tdap) 02/07/2030   DEXA SCAN  Completed   Hepatitis C Screening  Completed   Zoster Vaccines- Shingrix  Completed   HPV VACCINES  Aged Out   COVID-19 Vaccine  Discontinued    Health Maintenance Health Maintenance Due  Topic Date Due   MAMMOGRAM  06/01/2020   Mammogram- scheduled 09/03/22.   Lung Cancer Screening: (Low Dose CT Chest recommended if Age 71-80years, 30 pack-year currently smoking OR have quit w/in 15years.) does not qualify.   Hepatitis C Screening: Completed 07/06/18.  Vision Screening: Recommended annual ophthalmology exams for  early detection of glaucoma and other disorders of the eye.  Dental Screening: Recommended annual dental exams for proper oral hygiene  Community Resource Referral / Chronic Care Management: CRR required this visit?  No   CCM required this visit?  No      Plan:     I have personally reviewed and noted the following in the patient's chart:   Medical and social history Use of alcohol, tobacco or illicit drugs  Current medications and supplements including opioid prescriptions. Patient is currently taking opioid prescriptions. Information provided to patient regarding non-opioid alternatives. Patient advised to discuss non-opioid treatment plan with their provider. Functional ability and status Nutritional status Physical activity Advanced directives List of other physicians Hospitalizations, surgeries, and ER visits in previous 12 months Vitals Screenings to include cognitive, depression, and falls Referrals and appointments  In addition, I have reviewed and discussed with patient certain preventive protocols, quality metrics, and best practice recommendations. A written personalized care plan for preventive services as well as general preventive health recommendations were provided to patient.     DLeta Jungling LPN   15/17/6160

## 2022-07-30 NOTE — Patient Instructions (Addendum)
Ms. Katherine [name] , Thank you for taking time to come for your Medicare Wellness Visit. I appreciate your ongoing commitment to your health goals. Please review the following plan we discussed and let me know if I can assist you in the future.   These are the goals we discussed:  Goals Addressed             This Visit's Progress    DIET - INCREASE WATER INTAKE       Stay hydrated     Increase physical activity       Walk more for exercise in the house          This is a list of the screening recommended for you and due dates:  Health Maintenance  Topic Date Due   Mammogram  06/01/2020   Flu Shot  10/17/2022*   Colon Cancer Screening  05/05/2023*   Pneumonia Vaccine (2 - PCV) 07/31/2023*   Medicare Annual Wellness Visit  07/31/2023   DTaP/Tdap/Td vaccine (3 - Td or Tdap) 02/07/2030   DEXA scan (bone density measurement)  Completed   Hepatitis C Screening: USPSTF Recommendation to screen - Ages 69-79 yo.  Completed   Zoster (Shingles) Vaccine  Completed   HPV Vaccine  Aged Out   COVID-19 Vaccine  Discontinued  *Topic was postponed. The date shown is not the original due date.    Next appointment: Follow up in one year for your annual wellness visit    Preventive Care 65 Years and Older, Female Preventive care refers to lifestyle choices and visits with your health care provider that can promote health and wellness. What does preventive care include? A yearly physical exam. This is also called an annual well check. Dental exams once or twice a year. Routine eye exams. Ask your health care provider how often you should have your eyes checked. Personal lifestyle choices, including: Daily care of your teeth and gums. Regular physical activity. Eating a healthy diet. Avoiding tobacco and drug use. Limiting alcohol use. Practicing safe sex. Taking low-dose aspirin every day. Taking vitamin and mineral supplements as recommended by your health care provider. What happens  during an annual well check? The services and screenings done by your health care provider during your annual well check will depend on your age, overall health, lifestyle risk factors, and family history of disease. Counseling  Your health care provider may ask you questions about your: Alcohol use. Tobacco use. Drug use. Emotional well-being. Home and relationship well-being. Sexual activity. Eating habits. History of falls. Memory and ability to understand (cognition). Work and work Statistician. Reproductive health. Screening  You may have the following tests or measurements: Height, weight, and BMI. Blood pressure. Lipid and cholesterol levels. These may be checked every 5 years, or more frequently if you are over 15 years old. Skin check. Lung cancer screening. You may have this screening every year starting at age 31 if you have a 30-pack-year history of smoking and currently smoke or have quit within the past 15 years. Fecal occult blood test (FOBT) of the stool. You may have this test every year starting at age 31. Flexible sigmoidoscopy or colonoscopy. You may have a sigmoidoscopy every 5 years or a colonoscopy every 10 years starting at age 44. Hepatitis C blood test. Hepatitis B blood test. Sexually transmitted disease (STD) testing. Diabetes screening. This is done by checking your blood sugar (glucose) after you have not eaten for a while (fasting). You may have this done every 1-3 years.  Bone density scan. This is done to screen for osteoporosis. You may have this done starting at age 54. Mammogram. This may be done every 1-2 years. Talk to your health care provider about how often you should have regular mammograms. Talk with your health care provider about your test results, treatment options, and if necessary, the need for more tests. Vaccines  Your health care provider may recommend certain vaccines, such as: Influenza vaccine. This is recommended every  year. Tetanus, diphtheria, and acellular pertussis (Tdap, Td) vaccine. You may need a Td booster every 10 years. Zoster vaccine. You may need this after age 52. Pneumococcal 13-valent conjugate (PCV13) vaccine. One dose is recommended after age 67. Pneumococcal polysaccharide (PPSV23) vaccine. One dose is recommended after age 12. Talk to your health care provider about which screenings and vaccines you need and how often you need them. This information is not intended to replace advice given to you by your health care provider. Make sure you discuss any questions you have with your health care provider. Document Released: 08/01/2015 Document Revised: 03/24/2016 Document Reviewed: 05/06/2015 Elsevier Interactive Patient Education  2017 [place] Prevention in the Home Falls can cause injuries. They can happen to people of all ages. There are many things you can do to make your home safe and to help prevent falls. What can I do on the outside of my home? Regularly fix the edges of walkways and driveways and fix any cracks. Remove anything that might make you trip as you walk through a door, such as a raised step or threshold. Trim any bushes or trees on the path to your home. Use bright outdoor lighting. Clear any walking paths of anything that might make someone trip, such as rocks or tools. Regularly check to see if handrails are loose or broken. Make sure that both sides of any steps have handrails. Any raised decks and porches should have guardrails on the edges. Have any leaves, snow, or ice cleared regularly. Use sand or salt on walking paths during winter. Clean up any spills in your garage right away. This includes oil or grease spills. What can I do in the bathroom? Use night lights. Install grab bars by the toilet and in the tub and shower. Do not use towel bars as grab bars. Use non-skid mats or decals in the tub or shower. If you need to sit down in the shower, use a  plastic, non-slip stool. Keep the floor dry. Clean up any water that spills on the floor as soon as it happens. Remove soap buildup in the tub or shower regularly. Attach bath mats securely with double-sided non-slip rug tape. Do not have throw rugs and other things on the floor that can make you trip. What can I do in the bedroom? Use night lights. Make sure that you have a light by your bed that is easy to reach. Do not use any sheets or blankets that are too big for your bed. They should not hang down onto the floor. Have a firm chair that has side arms. You can use this for support while you get dressed. Do not have throw rugs and other things on the floor that can make you trip. What can I do in the kitchen? Clean up any spills right away. Avoid walking on wet floors. Keep items that you use a lot in easy-to-reach places. If you need to reach something above you, use a strong step stool that has a grab bar.  Keep electrical cords out of the way. Do not use floor polish or wax that makes floors slippery. If you must use wax, use non-skid floor wax. Do not have throw rugs and other things on the floor that can make you trip. What can I do with my stairs? Do not leave any items on the stairs. Make sure that there are handrails on both sides of the stairs and use them. Fix handrails that are broken or loose. Make sure that handrails are as long as the stairways. Check any carpeting to make sure that it is firmly attached to the stairs. Fix any carpet that is loose or worn. Avoid having throw rugs at the top or bottom of the stairs. If you do have throw rugs, attach them to the floor with carpet tape. Make sure that you have a light switch at the top of the stairs and the bottom of the stairs. If you do not have them, ask someone to add them for you. What else can I do to help prevent falls? Wear shoes that: Do not have high heels. Have rubber bottoms. Are comfortable and fit you  well. Are closed at the toe. Do not wear sandals. If you use a stepladder: Make sure that it is fully opened. Do not climb a closed stepladder. Make sure that both sides of the stepladder are locked into place. Ask someone to hold it for you, if possible. Clearly mark and make sure that you can see: Any grab bars or handrails. First and last steps. Where the edge of each step is. Use tools that help you move around (mobility aids) if they are needed. These include: Canes. Walkers. Scooters. Crutches. Turn on the lights when you go into a dark area. Replace any light bulbs as soon as they burn out. Set up your furniture so you have a clear path. Avoid moving your furniture around. If any of your floors are uneven, fix them. If there are any pets around you, be aware of where they are. Review your medicines with your doctor. Some medicines can make you feel dizzy. This can increase your chance of falling. Ask your doctor what other things that you can do to help prevent falls. This information is not intended to replace advice given to you by your health care provider. Make sure you discuss any questions you have with your health care provider. Document Released: 05/01/2009 Document Revised: 12/11/2015 Document Reviewed: 08/09/2014 Elsevier Interactive Patient Education  2017 [employer].

## 2022-07-30 NOTE — Telephone Encounter (Signed)
Scheduled today, 07/30/22 @ 3:30pm

## 2022-07-30 NOTE — Telephone Encounter (Signed)
Katherine [name], I placed a STAT ultrasound order for [facility]. Can you notify stat teams pool?

## 2022-07-30 NOTE — Telephone Encounter (Signed)
Stat team has been notified. They are working on getting her in.

## 2022-08-02 ENCOUNTER — Other Ambulatory Visit: Payer: Self-pay | Admitting: Primary Care

## 2022-08-02 DIAGNOSIS — R748 Abnormal levels of other serum enzymes: Secondary | ICD-10-CM

## 2022-08-02 DIAGNOSIS — R7989 Other specified abnormal findings of blood chemistry: Secondary | ICD-10-CM

## 2022-08-02 DIAGNOSIS — D72829 Elevated white blood cell count, unspecified: Secondary | ICD-10-CM

## 2022-08-02 DIAGNOSIS — R300 Dysuria: Secondary | ICD-10-CM

## 2022-08-03 ENCOUNTER — Ambulatory Visit: Payer: Medicare Other | Admitting: Primary Care

## 2022-08-04 ENCOUNTER — Ambulatory Visit: Payer: Medicare Other | Admitting: Primary Care

## 2022-08-11 ENCOUNTER — Other Ambulatory Visit: Payer: Medicare Other

## 2022-08-12 ENCOUNTER — Other Ambulatory Visit: Payer: Medicare Other

## 2022-08-13 ENCOUNTER — Other Ambulatory Visit (INDEPENDENT_AMBULATORY_CARE_PROVIDER_SITE_OTHER): Payer: Medicare Other

## 2022-08-13 ENCOUNTER — Telehealth: Payer: Self-pay | Admitting: Primary Care

## 2022-08-13 DIAGNOSIS — R748 Abnormal levels of other serum enzymes: Secondary | ICD-10-CM

## 2022-08-13 DIAGNOSIS — R5382 Chronic fatigue, unspecified: Secondary | ICD-10-CM

## 2022-08-13 DIAGNOSIS — R300 Dysuria: Secondary | ICD-10-CM | POA: Diagnosis not present

## 2022-08-13 DIAGNOSIS — D72829 Elevated white blood cell count, unspecified: Secondary | ICD-10-CM | POA: Diagnosis not present

## 2022-08-13 LAB — POC URINALSYSI DIPSTICK (AUTOMATED)
Bilirubin, UA: NEGATIVE
Blood, UA: NEGATIVE
Glucose, UA: NEGATIVE
Ketones, UA: NEGATIVE
Leukocytes, UA: NEGATIVE
Nitrite, UA: NEGATIVE
Protein, UA: NEGATIVE
Spec Grav, UA: 1.015 (ref 1.010–1.025)
Urobilinogen, UA: 0.2 E.U./dL — AB
pH, UA: 5.5 (ref 5.0–8.0)

## 2022-08-13 NOTE — Telephone Encounter (Signed)
Pt called asking if [name] could scheduled an appt with her to check bp monitor? Call back # [phone]

## 2022-08-16 NOTE — Progress Notes (Signed)
Patient has been scheduled an in-office appointment with Charlene Brooke, PharmD for a blood pressure monitor check. Appointment is 08/17/2022 at 3:45.  Charlene Brooke, PharmD notified  Marijean Niemann, [place] Clinical Pharmacy Assistant [phone]

## 2022-08-17 ENCOUNTER — Encounter: Payer: Medicare Other | Admitting: Pharmacist

## 2022-08-17 ENCOUNTER — Ambulatory Visit: Payer: Medicare Other | Admitting: Neurology

## 2022-08-17 NOTE — Progress Notes (Unsigned)
Care Management & Coordination Services Pharmacy Note  08/17/2022 Name:  Katherine [name] MRN:  [id number] DOB:  [birth year]  Summary: ***  Recommendations/Changes made from today's visit: ***  Follow up plan: ***   Subjective: Katherine [name] is an 72 y.o. year old female who is a primary patient of [name], NP.  The care coordination team was consulted for assistance with disease management and care coordination needs.    {CCMTELEPHONEFACETOFACE:21091510} for {CCMINITIALFOLLOWUPCHOICE:21091511}.  Recent office visits: ***  Recent consult visits: ***  Hospital visits: {Hospital DC Yes/No:25215}   Objective:  Lab Results  Component Value Date   CREATININE 0.91 07/28/2022   BUN 12 07/28/2022   GFR 75.84 02/05/2022   EGFR 68 07/28/2022   GFRNONAA >60 12/02/2021   GFRAA 90 03/03/2018   NA 140 07/28/2022   K 3.9 07/28/2022   CALCIUM 9.9 07/28/2022   CO2 19 (L) 07/28/2022   GLUCOSE 107 (H) 07/28/2022    Lab Results  Component Value Date/Time   HGBA1C 5.9 (H) 07/28/2022 09:50 AM   HGBA1C 6.0 02/05/2022 10:12 AM   GFR 75.84 02/05/2022 10:12 AM   GFR 63.47 06/04/2021 03:48 PM    Last diabetic Eye exam: No results found for: "HMDIABEYEEXA"  Last diabetic Foot exam: No results found for: "HMDIABFOOTEX"   Lab Results  Component Value Date   CHOL 211 (H) 03/10/2021   HDL 57.40 03/10/2021   LDLCALC 60 04/15/2020   LDLDIRECT 143.0 03/10/2021   TRIG 266.0 (H) 03/10/2021   CHOLHDL 4 03/10/2021       Latest Ref Rng & Units 08/13/2022    3:57 PM 07/28/2022    9:50 AM 11/11/2020   11:58 AM  Hepatic Function  Total Protein 6.1 - 8.1 g/dL 6.7  6.8  7.1   Albumin 3.9 - 4.9 g/dL  4.1  4.2   AST 10 - 35 U/L '12  9  20   '$ ALT 6 - 29 U/L '10  10  12   '$ Alk Phosphatase 44 - 121 IU/L  162  110   Total Bilirubin 0.2 - 1.2 mg/dL 0.2  0.2  0.6   Bilirubin, Direct 0.0 - 0.2 mg/dL 0.0       Lab Results  Component Value Date/Time   TSH 1.180 12/02/2021 12:05  PM   TSH 3.33 10/07/2020 08:50 AM   TSH 1.93 03/13/2018 04:15 PM       Latest Ref Rng & Units 08/13/2022    3:57 PM 07/28/2022    9:50 AM 06/18/2022    4:45 PM  CBC  WBC 3.8 - 10.8 Thousand/uL 8.8  11.1  11.8   Hemoglobin 11.7 - 15.5 g/dL 15.1  15.2  14.8   Hematocrit 35.0 - 45.0 % 44.7  45.2  43.3   Platelets 140 - 400 Thousand/uL 322  317  302     Lab Results  Component Value Date/Time   VD25OH 14 (L) 06/18/2022 04:45 PM   VD25OH 21.57 (L) 03/10/2021 08:45 AM   VD25OH 38.63 04/15/2020 12:41 PM   VITAMINB12 287 06/18/2022 04:45 PM   VITAMINB12 265 02/05/2022 10:12 AM    Clinical ASCVD: {YES/NO:21197} The 10-year ASCVD risk score (Arnett DK, et al., 2019) is: 13.1%   Values used to calculate the score:     Age: 89 years     Sex: Female     Is Non-Hispanic African American: No     Diabetic: No     Tobacco smoker: No     Systolic  Blood Pressure: 130 mmHg     Is BP treated: Yes     HDL Cholesterol: 57.4 mg/dL     Total Cholesterol: 211 mg/dL    ***Other: (CHADS2VASc if Afib, MMRC or CAT for COPD, ACT, DEXA)     07/30/2022    4:13 PM 02/24/2021    4:15 PM 04/15/2020   11:45 AM  Depression screen PHQ 2/9  Decreased Interest 0 0 0  Down, Depressed, Hopeless 0 1 1  PHQ - 2 Score 0 1 1  Altered sleeping  0 0  Tired, decreased energy  3 3  Change in appetite  3 3  Feeling bad or failure about yourself   3 3  Trouble concentrating  0 0  Moving slowly or fidgety/restless  0 0  Suicidal thoughts   0  PHQ-9 Score  10 10  Difficult doing work/chores   Not difficult at all     Social History   Tobacco Use  Smoking Status Never  Smokeless Tobacco Never   BP Readings from Last 3 Encounters:  06/18/22 120/72  05/04/22 132/80  04/29/22 (!) 152/70   Pulse Readings from Last 3 Encounters:  06/18/22 97  05/04/22 (!) 110  04/29/22 100   Wt Readings from Last 3 Encounters:  07/30/22 188 lb (85.3 kg)  06/18/22 188 lb (85.3 kg)  05/04/22 190 lb (86.2 kg)   BMI  Readings from Last 3 Encounters:  07/30/22 34.39 kg/m  06/18/22 34.39 kg/m  05/04/22 34.75 kg/m    Allergies  Allergen Reactions   Chlorine Other (See Comments)    Smell causes lightheadedness.   Dilaudid [Hydromorphone]     hallucinations   Codeine Anxiety   Darvon Anxiety   Hydrocodone Anxiety   Meperidine Anxiety   Oxycodone Anxiety   Tramadol Anxiety   Victoza [Liraglutide] Nausea Only and Other (See Comments)    And weakness    Medications Reviewed Today     Reviewed by Leta Jungling, LPN (Licensed Practical Nurse) on 07/30/22 at [place] List Status: <None>   Medication Order Taking? Sig Documenting Provider Last Dose Status Informant  clobetasol (TEMOVATE) 0.05 % external solution [id number] No Apply 1 application. topically 2 (two) times daily. Eugenia Pancoast, FNP Taking Active   DULoxetine (CYMBALTA) 30 MG capsule [id number] No Take 30 mg by mouth daily. [provider] Taking Active   DULoxetine (CYMBALTA) 60 MG capsule [id number] No Take 60 mg by mouth every morning. Takes with her '30mg'$  capsule to equal 90 mg daily [provider] Taking Active   famotidine (PEPCID) 20 MG tablet [id number] No TAKE 1 TABLET (20 MG TOTAL) BY MOUTH DAILY. FOR HEARTBURN. [name], NP Taking Active   gabapentin (NEURONTIN) 300 MG capsule [id number] No TAKE 1 CAPSULE (300 MG TOTAL) BY MOUTH AT BEDTIME. FOR BACK PAIN. [name], NP Taking Active   metoprolol tartrate (LOPRESSOR) 25 MG tablet [id number] No Take 1 tablet (25 mg total) by mouth 2 (two) times daily. Minna Merritts, MD Taking Active   Multiple Vitamin (MULTIVITAMIN) tablet [id number] No Take 1 tablet by mouth daily. [provider] Taking Active   NUPLAZID 34 MG CAPS [id number] No Take 1 capsule by mouth daily. [provider] Taking Active   OVER THE COUNTER MEDICATION [id number] No [facility] vitamin [provider] Taking Active   OVER THE COUNTER MEDICATION  [id number] No 2 tablets daily. [employer], Historical, MD Taking Active   pantoprazole (PROTONIX) 40 MG tablet [id number]  No Take 1 tablet (40 mg total) by mouth 2 (two) times daily before a meal. For heartburn. Irene Shipper, MD Taking Active   propranolol (INDERAL) 20 MG tablet [id number] No TAKE 1 TABLET (20 MG TOTAL) BY MOUTH 3 (THREE) TIMES DAILY AS NEEDED. FOR HEARTRATE >110 BPM Gollan, Kathlene November, MD Taking Active   spironolactone (ALDACTONE) 25 MG tablet [id number] No TAKE 1 TABLET (25 MG TOTAL) BY MOUTH DAILY. Minna Merritts, MD Taking Active   SUMAtriptan (IMITREX) 100 MG tablet [id number] No Take 100 mg by mouth as needed. [provider] Taking Expired 03/17/22 2359   tamsulosin (FLOMAX) 0.4 MG CAPS capsule [id number] No Take 0.4 mg by mouth daily. [provider] Taking Active   topiramate (TOPAMAX) 50 MG tablet [id number] No Take 1 tablet (50 mg total) by mouth daily. Eugenia Pancoast, FNP Taking Active   Vitamin D, Ergocalciferol, (DRISDOL) 1.25 MG (50000 UNIT) CAPS capsule [id number]  Take 1 capsule by mouth once weekly for 12 weeks. [name], NP  Active             SDOH:  (Social Determinants of Health) assessments and interventions performed: {yes/no:20286} SDOH Interventions    Flowsheet Row Clinical Support from 07/30/2022 in [place] at [facility] Visit from 04/15/2020 in [place] at [place] from 07/09/2019 in [place] at [place] Interventions Intervention Not Indicated -- --  Housing Interventions Intervention Not Indicated -- --  Transportation Interventions Intervention Not Indicated -- --  Utilities Interventions Intervention Not Indicated -- --  Depression Interventions/Treatment  -- Counseling Currently on Treatment  Financial Strain Interventions Intervention Not Indicated -- --  Physical Activity  Interventions Intervention Not Indicated -- --  Stress Interventions Intervention Not Indicated  [Empathetic listening] -- --  Social Connections Interventions Intervention Not Indicated -- --       Medication Assistance: {MEDASSISTANCEINFO:25044}  Medication Access: Within the past 30 days, how often has patient missed a dose of medication? *** Is a pillbox or other method used to improve adherence? {YES/NO:21197} Factors that may affect medication adherence? {CHL DESC; BARRIERS:21522} Are meds synced by current pharmacy? {YES/NO:21197} Are meds delivered by current pharmacy? {YES/NO:21197} Does patient experience delays in picking up medications due to transportation concerns? {YES/NO:21197}  [facility] Reviewed: Is patient disadvantaged to use [facility]?: {YES/NO:21197} Current Rx insurance plan: *** Name and location of [facility] pharmacy[facility]  [id number] [place] [place]Phone: [phone]: [phone] [facility] services reviewed with patient today?: {YES/NO:21197} Patient requests to transfer care to [facility]?: {YES/NO:21197} Reason patient declined to change pharmacies: {US patient preference:27474}  Compliance/Adherence/Medication fill history: Care Gaps: ***  Star-Rating Drugs: ***   Assessment/Plan   Hypertension (BP goal <130/80) -Controlled - pt reports home BP is at goal; she checks regularly but has not been keeping a log -Current home readings: 120s/70s -Current treatment: Spironolactone 25 mg daily -Medications previously tried: carvedilol, chlorthalidone  -Denies hypotensive/hypertensive symptoms -Educated on BP goals and benefits of medications for prevention of heart attack, stroke and kidney damage; Importance of home blood pressure monitoring; -Counseled to monitor BP at home daily, document, and provide log at future appointments -Recommended to  continue current medication  Hyperlipidemia: (LDL goal < 100) -Not ideally controlled - pt was previously off of simvastatin for several months, LDL had increased from 60  to 143; she reports she is now taking simvastatin again, she had plenty on hand so has not needed a refill yet -Current treatment: Simvastatin 20 mg daily -Medications previously tried: simvastatin  -Educated on Cholesterol goals; Benefits of statin for ASCVD risk reduction; -Added simvastatin back to med list -Recommended to continue current medication  Depression/Anxiety (Goal: manage symptoms) -Relatively Controlled -pt reports duloxetine is the only drug that has ever helped her depression; pt reports family hx of depression (mother and father, brother); she reports 50 year hx of depression; she complains of dry mouth - Biotene, lozenges help some -trouble falling asleep, but sleeps well once she is asleep -Current treatment: Duloxetine 30 mg daily Duloxetine 60 mg - 2 tab daily Bupropion XL 150 mg daily AM Diazepam 10 mg HS -Medications previously tried/failed: aripiprazole, quetiapine, doxepin  -PHQ9: 10 (02/2021) -GAD7: not on file -Connected with [name], NP for mental health support -Educated on Benefits of medication for symptom control;  -Counseled dry mouth is likely due to high dose duloxetine; discussed benefits outweigh risks/side effects at this time -Recommended to continue current medication  Lewy Body dementia with behavioral disturbances (Goal: manage symptoms) -Relatively Controlled - pt reports Nuplazid has "done miracles" for her, but does think she is starting to build a tolerance -Per neurology - Nuplazid does not appear to be controlling delusions any longer, pt does not appear to have good insight; may consider atypical antipsychotics (haloperidol, olanzapine) although these would worsen parkinsonism -Current treatment  Nuplazid 34 mg daily HS (via PAP) -Medications previously tried:  quetiapine, rivastigmine, levodopa  -Recommended to continue current medication   Osteopenia (Goal prevent fractures) -Not ideally controlled - pt is not taking calcium or vitamin D; she has DEXA scan scheduled next month -Last DEXA Scan: 06/01/18   T-Score femoral neck: -1.5  T-Score total hip: -1.4  T-Score lumbar spine: -0.2  10-year probability of major osteoporotic fracture: 28.1%  10-year probability of hip fracture: 3.2% -Patient is a candidate for pharmacologic treatment due to T-Score -1.0 to -2.5 and 10-year risk of major osteoporotic fracture > 20% and T-Score -1.0 to -2.5 and 10-year risk of hip fracture > 3% -Current treatment  None -Medications previously tried: alendronate (GI)  -Recommend [phone] units of vitamin D daily. Recommend 1200 mg of calcium daily from dietary and supplemental sources.  -Consider pharmacologic treatment based on DEXA results next month  Migraines (Goal: reduce migraine frequency) -Controlled - pt reports rare migraines since starting topiramate, she used to have them once a week -Current treatment  Topiramate 50 mg daily Sumatriptan 100 mg PRN - rare use -Recommended to continue current medication  Back pain (Goal: manage symptoms) -Controlled - pt reports overall back pain is improving; she using gabapentin PRN and Aleve daily; she is worried about Tylenol and liver issues because her father died from non-alcoholic liver disease -Hx 2 back surgeries 2018 -Current treatment  Gabapentin 300 mg HS - PRN Tylenol 325 mg PRN Aleve OTC Duloxetine 150 mg/day -Counseled on risks of chronic NSAIDs (GI bleeding, hypertension, kidney damage);  -Educated on NALD; Tylenol liver risks; max daily dose of 3000 mg/day;  -Advised to use Tylenol daily and Aleve infrequently  Urinary voiding difficulty (Goal: manage symptoms) -Not ideally controlled - pt reports tamsulosin has helped with voiding difficulties but it is still a major issue; she is starting  pelvic floor PT in December and hopeful that this will help -Current treatment  Tamsulosin 0.4 mg daily -Recommended to continue current medication  Iron  deficiency anemia (Goal: manage symptoms) -Uncontrolled - pt reports she is not taking iron supplements, she had significant constipation with them int he past and she would like another iron infusion -Current treatment  None -Will coordinate with PCP to set up iron infusion  GERD (Goal: manage symptoms) -Controlled - pt reports dairy products trigger GERD; she takes PPI daily and has occasional breakthrough issue for which she takes famotidine -Current treatment  Pantoprazole 40 mg daily Famotidine 20 mg PRN -Counseled on long term PPI risks; pt is not interested in stopping PPI -Recommended to continue current medication  Health Maintenance -Vaccine gaps: Prevnar, Covid booster, Flu -Pt [facility] the [facility]. Will get  -Current therapy:  Multivitamin [employer] vitamin (hair/skin/nails) Fiber supplement -Patient is satisfied with current therapy and denies issues -Recommended to continue current medication   ***

## 2022-08-18 LAB — URINE CULTURE
MICRO NUMBER:: 14479017
Result:: NO GROWTH
SPECIMEN QUALITY:: ADEQUATE

## 2022-08-18 LAB — HEPATIC FUNCTION PANEL
AG Ratio: 1.6 (calc) (ref 1.0–2.5)
ALT: 10 U/L (ref 6–29)
AST: 12 U/L (ref 10–35)
Albumin: 4.1 g/dL (ref 3.6–5.1)
Alkaline phosphatase (APISO): 129 U/L (ref 37–153)
Bilirubin, Direct: 0 mg/dL (ref 0.0–0.2)
Globulin: 2.6 g/dL (calc) (ref 1.9–3.7)
Indirect Bilirubin: 0.2 mg/dL (calc) (ref 0.2–1.2)
Total Bilirubin: 0.2 mg/dL (ref 0.2–1.2)
Total Protein: 6.7 g/dL (ref 6.1–8.1)

## 2022-08-18 LAB — B. BURGDORFI ANTIBODIES BY WB

## 2022-08-18 LAB — CBC WITH DIFFERENTIAL/PLATELET
Absolute Monocytes: 590 cells/uL (ref 200–950)
Basophils Absolute: 62 cells/uL (ref 0–200)
Basophils Relative: 0.7 %
Eosinophils Absolute: 370 cells/uL (ref 15–500)
Eosinophils Relative: 4.2 %
HCT: 44.7 % (ref 35.0–45.0)
Hemoglobin: 15.1 g/dL (ref 11.7–15.5)
Lymphs Abs: 2059 cells/uL (ref 850–3900)
MCH: 31.1 pg (ref 27.0–33.0)
MCHC: 33.8 g/dL (ref 32.0–36.0)
MCV: 92 fL (ref 80.0–100.0)
MPV: 11.2 fL (ref 7.5–12.5)
Monocytes Relative: 6.7 %
Neutro Abs: 5720 cells/uL (ref 1500–7800)
Neutrophils Relative %: 65 %
Platelets: 322 10*3/uL (ref 140–400)
RBC: 4.86 10*6/uL (ref 3.80–5.10)
RDW: 12.4 % (ref 11.0–15.0)
Total Lymphocyte: 23.4 %
WBC: 8.8 10*3/uL (ref 3.8–10.8)

## 2022-08-18 LAB — PATHOLOGIST SMEAR REVIEW

## 2022-08-23 ENCOUNTER — Ambulatory Visit: Payer: Medicare Other | Admitting: Internal Medicine

## 2022-08-23 ENCOUNTER — Telehealth: Payer: Self-pay | Admitting: Internal Medicine

## 2022-08-23 NOTE — Telephone Encounter (Signed)
Recieved VM that pt would like to have appts cancelled due to her being out of town. Called and LVM to notify that appts have been cancelled and to give [place] a call to reschedule

## 2022-08-26 DIAGNOSIS — G43719 Chronic migraine without aura, intractable, without status migrainosus: Secondary | ICD-10-CM | POA: Diagnosis not present

## 2022-08-31 ENCOUNTER — Telehealth: Payer: Self-pay | Admitting: Primary Care

## 2022-08-31 DIAGNOSIS — R296 Repeated falls: Secondary | ICD-10-CM

## 2022-08-31 DIAGNOSIS — R2689 Other abnormalities of gait and mobility: Secondary | ICD-10-CM

## 2022-08-31 NOTE — Telephone Encounter (Signed)
Patient would like to know if she can have a new referral sent in to [facility] in [place]?  The one that was sent  for her before expired ,because she had illness in her family and was unable to make it.

## 2022-08-31 NOTE — Telephone Encounter (Signed)
Noted, referral placed.  

## 2022-09-03 ENCOUNTER — Ambulatory Visit: Payer: Medicare Other

## 2022-09-03 ENCOUNTER — Ambulatory Visit: Payer: Medicare Other | Admitting: Primary Care

## 2022-09-06 ENCOUNTER — Other Ambulatory Visit: Payer: Self-pay | Admitting: Primary Care

## 2022-09-06 ENCOUNTER — Ambulatory Visit (INDEPENDENT_AMBULATORY_CARE_PROVIDER_SITE_OTHER): Payer: Medicare Other | Admitting: Internal Medicine

## 2022-09-06 ENCOUNTER — Encounter: Payer: Self-pay | Admitting: Internal Medicine

## 2022-09-06 VITALS — BP 128/84 | HR 100 | Temp 97.4°F | Ht 62.0 in | Wt 182.0 lb

## 2022-09-06 DIAGNOSIS — R21 Rash and other nonspecific skin eruption: Secondary | ICD-10-CM | POA: Diagnosis not present

## 2022-09-06 DIAGNOSIS — G3183 Dementia with Lewy bodies: Secondary | ICD-10-CM | POA: Diagnosis not present

## 2022-09-06 DIAGNOSIS — E559 Vitamin D deficiency, unspecified: Secondary | ICD-10-CM

## 2022-09-06 DIAGNOSIS — F0282 Dementia in other diseases classified elsewhere, unspecified severity, with psychotic disturbance: Secondary | ICD-10-CM
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
## 2022-09-06 NOTE — Assessment & Plan Note (Signed)
Seems to have stable ongoing delusions (despite the nuplazid)

## 2022-09-06 NOTE — Assessment & Plan Note (Signed)
Non specific rash on face Doubt this is related to zonisamide--but it is possible Reassured them that this is not shingles  Doesn't look like the apparent neurodermatitis that she has on her scalp--can continue the steroid lotion for this (though she vastly overuses)

## 2022-09-07 ENCOUNTER — Telehealth: Payer: Self-pay | Admitting: Primary Care

## 2022-09-07 NOTE — Telephone Encounter (Signed)
Err, Patient scheduled appt instead

## 2022-09-09 ENCOUNTER — Ambulatory Visit: Payer: Medicare Other | Admitting: Primary Care

## 2022-09-14 ENCOUNTER — Ambulatory Visit: Payer: Medicare Other | Admitting: Primary Care

## 2022-09-15 ENCOUNTER — Other Ambulatory Visit: Payer: Self-pay | Admitting: Primary Care

## 2022-09-15 DIAGNOSIS — M4316 Spondylolisthesis, lumbar region: Secondary | ICD-10-CM

## 2022-09-16 ENCOUNTER — Other Ambulatory Visit: Payer: Self-pay | Admitting: Primary Care

## 2022-09-16 DIAGNOSIS — L218 Other seborrheic dermatitis: Secondary | ICD-10-CM | POA: Diagnosis not present

## 2022-09-16 DIAGNOSIS — M4316 Spondylolisthesis, lumbar region: Secondary | ICD-10-CM

## 2022-09-16 DIAGNOSIS — L718 Other rosacea: Secondary | ICD-10-CM | POA: Diagnosis not present

## 2022-09-17 ENCOUNTER — Other Ambulatory Visit: Payer: Self-pay | Admitting: Primary Care

## 2022-09-17 ENCOUNTER — Other Ambulatory Visit: Payer: Medicare Other

## 2022-09-17 DIAGNOSIS — M4316 Spondylolisthesis, lumbar region: Secondary | ICD-10-CM

## 2022-09-17 MED ORDER — GABAPENTIN 300 MG PO CAPS: 300.0000 mg | ORAL_CAPSULE | Freq: Every day | ORAL | 0 refills | Status: DC

## 2022-09-20 ENCOUNTER — Other Ambulatory Visit: Payer: Medicare Other

## 2022-09-20 ENCOUNTER — Ambulatory Visit: Payer: Medicare Other | Admitting: Internal Medicine

## 2022-09-20 ENCOUNTER — Encounter: Payer: Self-pay | Admitting: Primary Care

## 2022-09-20 ENCOUNTER — Ambulatory Visit: Payer: Medicare Other

## 2022-09-20 ENCOUNTER — Ambulatory Visit (INDEPENDENT_AMBULATORY_CARE_PROVIDER_SITE_OTHER): Payer: Medicare Other | Admitting: Primary Care

## 2022-09-20 VITALS — BP 122/78 | HR 100 | Temp 98.0°F | Ht 62.0 in | Wt 183.0 lb

## 2022-09-20 DIAGNOSIS — G3109 Other frontotemporal dementia: Secondary | ICD-10-CM

## 2022-09-20 DIAGNOSIS — E559 Vitamin D deficiency, unspecified: Secondary | ICD-10-CM | POA: Diagnosis not present

## 2022-09-20 DIAGNOSIS — J302 Other seasonal allergic rhinitis: Secondary | ICD-10-CM | POA: Insufficient documentation

## 2022-09-20 DIAGNOSIS — D509 Iron deficiency anemia, unspecified: Secondary | ICD-10-CM

## 2022-09-20 DIAGNOSIS — F0280 Dementia in other diseases classified elsewhere without behavioral disturbance: Secondary | ICD-10-CM

## 2022-09-20 DIAGNOSIS — G43709 Chronic migraine without aura, not intractable, without status migrainosus: Secondary | ICD-10-CM

## 2022-09-20 DIAGNOSIS — R21 Rash and other nonspecific skin eruption: Secondary | ICD-10-CM

## 2022-09-20 DIAGNOSIS — F062 Psychotic disorder with delusions due to known physiological condition: Secondary | ICD-10-CM | POA: Diagnosis not present

## 2022-09-20 DIAGNOSIS — G3183 Dementia with Lewy bodies: Secondary | ICD-10-CM | POA: Diagnosis not present

## 2022-09-20 LAB — IBC + FERRITIN
Ferritin: 131.1 ng/mL (ref 10.0–291.0)
Iron: 58 ug/dL (ref 42–145)
Saturation Ratios: 17.1 % — ABNORMAL LOW (ref 20.0–50.0)
TIBC: 338.8 ug/dL (ref 250.0–450.0)
Transferrin: 242 mg/dL (ref 212.0–360.0)

## 2022-09-20 LAB — CBC
HCT: 45.4 % (ref 36.0–46.0)
Hemoglobin: 15.1 g/dL — ABNORMAL HIGH (ref 12.0–15.0)
MCHC: 33.2 g/dL (ref 30.0–36.0)
MCV: 94.8 fl (ref 78.0–100.0)
Platelets: 353 10*3/uL (ref 150.0–400.0)
RBC: 4.79 Mil/uL (ref 3.87–5.11)
RDW: 13.4 % (ref 11.5–15.5)
WBC: 12.1 10*3/uL — ABNORMAL HIGH (ref 4.0–10.5)

## 2022-09-20 LAB — COMPREHENSIVE METABOLIC PANEL
ALT: 10 U/L (ref 0–35)
AST: 13 U/L (ref 0–37)
Albumin: 4 g/dL (ref 3.5–5.2)
Alkaline Phosphatase: 123 U/L — ABNORMAL HIGH (ref 39–117)
BUN: 14 mg/dL (ref 6–23)
CO2: 25 mEq/L (ref 19–32)
Calcium: 9.7 mg/dL (ref 8.4–10.5)
Chloride: 107 mEq/L (ref 96–112)
Creatinine, Ser: 0.92 mg/dL (ref 0.40–1.20)
GFR: 62.89 mL/min (ref >60.00)
Glucose, Bld: 85 mg/dL (ref 70–99)
Potassium: 3.7 mEq/L (ref 3.5–5.1)
Sodium: 141 mEq/L (ref 135–145)
Total Bilirubin: 0.3 mg/dL (ref 0.2–1.2)
Total Protein: 7.1 g/dL (ref 6.0–8.3)

## 2022-09-20 LAB — VITAMIN D 25 HYDROXY (VIT D DEFICIENCY, FRACTURES): VITD: 36.2 ng/mL (ref 30.00–100.00)

## 2022-09-20 MED ORDER — LEVOCETIRIZINE DIHYDROCHLORIDE 5 MG PO TABS: 5.0000 mg | ORAL_TABLET | Freq: Every evening | ORAL | 3 refills | Status: DC | PRN

## 2022-09-20 NOTE — Assessment & Plan Note (Signed)
Prescription provided for Xyzal 5 mg at bedtime.

## 2022-09-20 NOTE — Assessment & Plan Note (Signed)
Agree that her facial rash is stress induced.  Follows with dermatology. Continue oral antibiotics and topical treatment.  She is working with her psych mental health NP on stress. Continue duloxetine 90 mg daily. Proceed with plan for Seroquel 200 mg HS.

## 2022-09-20 NOTE — Progress Notes (Signed)
Subjective:    Patient ID: Katherine [name], female    DOB: Sep 14, 1950, 72 y.o.   MRN: [id number]  HPI  Katherine [name] is a very pleasant 72 y.o. female with a history of hypertension, chronic migraines, frontotemporal dementia, OSA, hyperlipidemia, chronic back pain who presents today to discuss facial rash.  Her husband joins [place] today.  Evaluated by Dr. Silvio Pate on 09/06/2022 for facial rash, increased stress and anxiety, discussing an ongoing affair that her husband is undergoing with another woman (husband present during exam), sores on scalp (following with dermatology). During this exam her facial rash was non specific, not suspected to be medication related and was not herpes zoster.   Today she continues to experience a facial rash. Evaluated by dermatology last week, was told that she has a "stress induced Rosacea" and was treated with an oral antibiotic and a topical gel. She's also been cleansing her skin. Her husband has noticed an improvement in her rash.   She continues to experience stress and anxiety. She worries constantly about her husband's affair. Has hired a Herbalist. This woman comes into their home and pretends that she lives there. She has an appointment scheduled in April with a new psychiatrist. She did see her current psych mental health NP today. She was prescribed Seroquel 200 mg to take HS, she plans on starting tonight.   Evaluated by headache specialist, managed on Topamax 50 mg daily for headache prevention and sumatriptan 100 mg PRN. Overall does well on this regimen.   Se would like a prescription for seasonal allergies. She is also needing labs drawn for her upcoming hematology appointment.   Review of Systems  Genitourinary:  Negative for dysuria, frequency and urgency.  Skin:  Positive for rash.  Neurological:  Negative for headaches.         Past Medical History:  Diagnosis Date   Anemia 2018   Anxiety    Arthritis    Borderline  diabetes    Cataract    Chest pain    a. 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 [facility]   Depression    Dyspnea on exertion    a. 03/2009 Echo: EF 65%, no rwma, triv TR.   GERD (gastroesophageal reflux disease)    High cholesterol    Hypertension    Hypokalemia    Migraine headache    Obesity    Parkinson's disease    Pre-diabetes    okay now    Tachycardia     Social History   Socioeconomic History   Marital status: Married    Spouse name: Not on file   Number of children: 1   Years of education: Not on file   Highest education level: Not on file  Occupational History   Occupation: Product manager: [employer]    Comment: retired-special needs teacher  Tobacco Use   Smoking status: Never   Smokeless tobacco: Never  Vaping Use   Vaping Use: Never used  Substance and Sexual Activity   Alcohol use: No   Drug use: No   Sexual activity: Yes    Partners: Male    Birth control/protection: Post-menopausal  Other Topics Concern   Not on file  Social History Narrative   Married.   One son is  18, lives in [place].   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   Enjoys substitute teaching, gardening, cooking.   Social Determinants of Health   Financial Resource Strain: Low Risk  (07/30/2022)   Overall Financial Resource Strain (CARDIA)    Difficulty of Paying Living Expenses: Not hard at all  Food Insecurity: No Food Insecurity (07/30/2022)   Hunger Vital Sign    Worried About Running Out of Food in the Last Year: Never true    Ran Out of Food in the Last Year: Never true  Transportation Needs: No Transportation Needs (07/30/2022)   PRAPARE - Hydrologist (Medical): No    Lack of Transportation (Non-Medical): No  Physical Activity: Unknown (07/30/2022)   Exercise Vital Sign     Days of Exercise per Week: 0 days    Minutes of Exercise per Session: Not on file  Stress: Stress Concern Present (07/30/2022)   [place]    Feeling of Stress : To some extent  Social Connections: Unknown (07/30/2022)   Social Connection and Isolation Panel [NHANES]    Frequency of Communication with Friends and Family: Not on file    Frequency of Social Gatherings with Friends and Family: Not on file    Attends Religious Services: Not on file    Active Member of Clubs or Organizations: Not on file    Attends Archivist Meetings: Not on file    Marital Status: Married  Intimate Partner Violence: Not At Risk (07/30/2022)   Humiliation, Afraid, Rape, and Kick questionnaire    Fear of Current or Ex-Partner: No    Emotionally Abused: No    Physically Abused: No    Sexually Abused: No    Past Surgical History:  Procedure Laterality Date   BLADDER SUSPENSION  2005   CARDIAC CATHETERIZATION     CESAREAN SECTION     CLOSED REDUCTION NASAL FRACTURE N/A 01/11/2020   Procedure: CLOSED REDUCTION NASAL FRACTURE;  Surgeon: Annlouise Gust, MD;  Location: [facility];  Service: ENT;  Laterality: N/A;   ENDOMETRIAL ABLATION  2007   FOOT SURGERY     bilateral bunions   LUMBAR LAMINECTOMY/DECOMPRESSION MICRODISCECTOMY Bilateral 01/12/2016   Procedure: Bilateral L4-5 Laminotomy/Foraminotomy;  Surgeon: Newman Pies, MD;  Location: [facility];  Service: Neurosurgery;  Laterality: Bilateral;  Bilateral L4-5 Laminotomy/Foraminotomy   TONSILLECTOMY  1971    Family History  Problem Relation Age of Onset   Heart disease Mother    Hypertension Mother    Heart failure Mother        CHF   Diabetes Mother    Cancer Mother        CERVICAL CANCER   Atrial fibrillation Mother    Heart disease Father    Diabetes Father    Rashes / Skin problems Father    Hypertension Father    Cirrhosis Father        liver  disease non alcoholic   Diabetes Sister    Breast cancer Sister 62   Diabetes Mellitus II Sister    Heart disease Brother    Heart failure Brother        CHF   Diabetes Brother    Cancer Brother 37       THROAT AND NECK   Esophageal cancer Brother    Lung cancer Brother    Atrial fibrillation Brother    Cancer Maternal Grandfather    Colon cancer Neg Hx    Stomach  cancer Neg Hx    Rectal cancer Neg Hx     Allergies  Allergen Reactions   Chlorine Other (See Comments)    Smell causes lightheadedness.   Dilaudid [Hydromorphone]     hallucinations   Codeine Anxiety   Darvon Anxiety   Hydrocodone Anxiety   Meperidine Anxiety   Oxycodone Anxiety   Tramadol Anxiety   Victoza [Liraglutide] Nausea Only and Other (See Comments)    And weakness    Current Outpatient Medications on File Prior to Visit  Medication Sig Dispense Refill   betamethasone dipropionate 0.05 % lotion SMARTSIG:Sparingly Topical Twice Daily     DULoxetine (CYMBALTA) 30 MG capsule Take 30 mg by mouth daily.     DULoxetine (CYMBALTA) 60 MG capsule Take 60 mg by mouth every morning. Takes with her '30mg'$  capsule to equal 90 mg daily     famotidine (PEPCID) 20 MG tablet TAKE 1 TABLET (20 MG TOTAL) BY MOUTH DAILY. FOR HEARTBURN. 90 tablet 1   gabapentin (NEURONTIN) 300 MG capsule Take 1 capsule (300 mg total) by mouth at bedtime. For back pain. 90 capsule 0   Multiple Vitamin (MULTIVITAMIN) tablet Take 1 tablet by mouth daily.     NUPLAZID 34 MG CAPS Take 1 capsule by mouth daily.     OVER THE COUNTER MEDICATION [employer] vitamin     OVER THE COUNTER MEDICATION 2 tablets daily. Air Shield     pantoprazole (PROTONIX) 40 MG tablet Take 1 tablet (40 mg total) by mouth 2 (two) times daily before a meal. For heartburn. 60 tablet 4   propranolol (INDERAL) 20 MG tablet TAKE 1 TABLET (20 MG TOTAL) BY MOUTH 3 (THREE) TIMES DAILY AS NEEDED. FOR HEARTRATE >110 BPM 90 tablet 2   spironolactone (ALDACTONE) 25 MG tablet TAKE 1  TABLET (25 MG TOTAL) BY MOUTH DAILY. 90 tablet 1   tamsulosin (FLOMAX) 0.4 MG CAPS capsule Take 0.4 mg by mouth daily.     topiramate (TOPAMAX) 50 MG tablet Take 1 tablet (50 mg total) by mouth daily.     triamcinolone cream (KENALOG) 0.1 % Apply 1 Application topically 2 (two) times daily.     Vitamin D, Ergocalciferol, (DRISDOL) 1.25 MG (50000 UNIT) CAPS capsule TAKE 1 CAPSULE BY MOUTH ONCE WEEKLY FOR 12 WEEKS. 12 capsule 0   clobetasol (TEMOVATE) 0.05 % external solution Apply 1 application. topically 2 (two) times daily. (Patient not taking: Reported on 09/20/2022) 50 mL 0   metoprolol tartrate (LOPRESSOR) 25 MG tablet Take 1 tablet (25 mg total) by mouth 2 (two) times daily. 60 tablet 2   SUMAtriptan (IMITREX) 100 MG tablet Take 100 mg by mouth as needed.     No current facility-administered medications on file prior to visit.    BP 122/78   Pulse 100   Temp 98 F (36.7 C) (Temporal)   Ht '5\' 2"'$  (1.575 m)   Wt 183 lb (83 kg)   SpO2 96%   BMI 33.47 kg/m  Objective:   Physical Exam Cardiovascular:     Rate and Rhythm: Normal rate and regular rhythm.  Pulmonary:     Effort: Pulmonary effort is normal.     Breath sounds: Normal breath sounds.  Musculoskeletal:     Cervical back: Neck supple.  Skin:    General: Skin is warm and dry.     Findings: Rash present.     Comments: Papular rash to bilateral cheeks, right worse than left. Several spots to forehead.  Assessment & Plan:  Rash Assessment & Plan: Agree that her facial rash is stress induced.  Follows with dermatology. Continue oral antibiotics and topical treatment.  She is working with her psych mental health NP on stress. Continue duloxetine 90 mg daily. Proceed with plan for Seroquel 200 mg HS.    Chronic migraine without aura without status migrainosus, not intractable Assessment & Plan: Overall controlled.  Continue Topamax 50 mg daily for prevention of migraines and sumatriptan 100 mg PRN.     Seasonal allergies Assessment & Plan: Prescription provided for Xyzal 5 mg at bedtime.  Orders: -     Levocetirizine Dihydrochloride; Take 1 tablet (5 mg total) by mouth at bedtime as needed for allergies.  Dispense: 90 tablet; Refill: 3  Iron deficiency anemia, unspecified iron deficiency anemia type -     Comprehensive metabolic panel -     CBC -     IBC + Ferritin  Vitamin D deficiency -     VITAMIN D 25 Hydroxy (Vit-D Deficiency, Fractures)  Frontotemporal dementia ([place]) Assessment & Plan: Continued.  Evident today during HPI.  Agree with initiation of Seroquel 200 mg at bedtime. Continue Cymbalta 90 mg daily.  Continue Nuplazid 34 mg daily.         [name], NP

## 2022-09-20 NOTE — Assessment & Plan Note (Signed)
Overall controlled.  Continue Topamax 50 mg daily for prevention of migraines and sumatriptan 100 mg PRN.

## 2022-09-20 NOTE — Assessment & Plan Note (Addendum)
Continued.  Evident today during HPI.  Agree with initiation of Seroquel 200 mg at bedtime. Continue Cymbalta 90 mg daily.  Continue Nuplazid 34 mg daily.

## 2022-09-21 ENCOUNTER — Inpatient Hospital Stay: Payer: Medicare Other | Attending: Internal Medicine

## 2022-09-21 ENCOUNTER — Telehealth: Payer: Self-pay | Admitting: Primary Care

## 2022-09-21 NOTE — Telephone Encounter (Signed)
See result note for further documentation.

## 2022-09-21 NOTE — Telephone Encounter (Signed)
Patient called in returning a call she received.  

## 2022-09-23 ENCOUNTER — Telehealth: Payer: Self-pay | Admitting: Internal Medicine

## 2022-09-23 MED FILL — Ferumoxytol Inj 510 MG/17ML (30 MG/ML) (Elemental Fe): INTRAVENOUS | Qty: 17 | Status: AC

## 2022-09-23 NOTE — Telephone Encounter (Signed)
Patient did not get labs prior- called to see about rescheduling appointment. No answer. Left voicemail informing patient of this. Appointments cancelled

## 2022-09-24 ENCOUNTER — Inpatient Hospital Stay: Payer: Medicare Other

## 2022-09-24 ENCOUNTER — Inpatient Hospital Stay: Payer: Medicare Other | Admitting: Internal Medicine

## 2022-09-24 ENCOUNTER — Telehealth: Payer: Self-pay | Admitting: Primary Care

## 2022-09-24 ENCOUNTER — Telehealth: Payer: Self-pay | Admitting: *Deleted

## 2022-09-24 NOTE — Telephone Encounter (Signed)
Faxed over labs from March 4th to the cancer center.

## 2022-09-24 NOTE — Telephone Encounter (Signed)
The pt. Called and left voice mail that Marland Mcalpine called the pt and left message that pt did not come and get her labs on 3/5 so she is cancelling the appt today for the pt. I called the pt. Back and they went to [facility] and had labs there. I called the PCP and asked them to fax the labs and then I called back to pt and her son answered and I let him know that I asked for the labs and sent a message to the doctor here and his team will get back to them today.

## 2022-09-24 NOTE — Telephone Encounter (Signed)
Sherry from the Cancer center called over and they are needing a copy of the lab work results Katherine [name] had on September 20, 2022. They can be faxed over to [phone]. Thank you!

## 2022-09-28 ENCOUNTER — Inpatient Hospital Stay: Payer: Medicare Other

## 2022-09-28 ENCOUNTER — Inpatient Hospital Stay: Payer: Medicare Other | Admitting: Internal Medicine

## 2022-09-28 MED FILL — Ferumoxytol Inj 510 MG/17ML (30 MG/ML) (Elemental Fe): INTRAVENOUS | Qty: 17 | Status: AC

## 2022-10-04 ENCOUNTER — Other Ambulatory Visit: Payer: Self-pay | Admitting: Cardiovascular Disease

## 2022-10-04 DIAGNOSIS — R079 Chest pain, unspecified: Secondary | ICD-10-CM

## 2022-10-04 DIAGNOSIS — I10 Essential (primary) hypertension: Secondary | ICD-10-CM

## 2022-10-05 ENCOUNTER — Ambulatory Visit (INDEPENDENT_AMBULATORY_CARE_PROVIDER_SITE_OTHER): Payer: Medicare Other | Admitting: Neurology

## 2022-10-05 ENCOUNTER — Encounter: Payer: Self-pay | Admitting: Neurology

## 2022-10-05 VITALS — BP 150/78 | HR 93 | Ht 62.0 in | Wt 180.5 lb

## 2022-10-05 DIAGNOSIS — G3184 Mild cognitive impairment, so stated: Secondary | ICD-10-CM | POA: Insufficient documentation
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
## 2022-10-06 ENCOUNTER — Telehealth: Payer: Self-pay | Admitting: Neurology

## 2022-10-06 NOTE — Telephone Encounter (Signed)
Referral for neuropsychology fax to [facility]. Phone: [phone], Fax: [phone]. 

## 2022-10-07 ENCOUNTER — Telehealth: Payer: Self-pay | Admitting: Cardiovascular Disease

## 2022-10-07 NOTE — Telephone Encounter (Signed)
Pt c/o BP issue: STAT if pt c/o blurred vision, one-sided weakness or slurred speech  1. What are your last 5 BP readings?   143/73 137/80 128/70 161/82 118/66  2. Are you having any other symptoms (ex. Dizziness, headache, blurred vision, passed out)?   Fatigue  3. What is your BP issue?    Husband states patient's BP has been fluctuating.

## 2022-10-07 NOTE — Telephone Encounter (Signed)
Spoke with patient and she stated that she checks her BP twice daily and the fluctuating from 187/80 - 118/60 has been concerning to her and she would like to know if any adjustments need to made with her medications. She is not taking the propranolol (INDERAL) 20 MG tablet daily, only as needed for HR > 110 as she doesn't want to become even more fatigued than she already is. Patient stated that the only medication from Dr. Rockey Situ she takes regularly is spironolactone (ALDACTONE) 25 MG tablet. Patient asking for medication adjustment if needed and something that doesn't increase fatigue. Please advise

## 2022-10-11 MED ORDER — LOSARTAN POTASSIUM 25 MG PO TABS: 25.0000 mg | ORAL_TABLET | Freq: Every day | ORAL | 0 refills | Status: DC

## 2022-10-11 NOTE — Telephone Encounter (Signed)
Pt returning call

## 2022-10-11 NOTE — Telephone Encounter (Signed)
Patient has been made aware and verbalized her understanding. She will keep a daily log of her pressures.

## 2022-10-11 NOTE — Telephone Encounter (Signed)
Left a message for the patient to call back.  Minna Merritts, MD  [facility] Triage16 hours ago (3:46 PM)    Would start losartan 25 mg daily for blood pressure Dose can be titrated up as needed if blood pressure continues to run high Max dose 100 daily Thx TGollan

## 2022-10-12 ENCOUNTER — Ambulatory Visit: Payer: Medicare Other | Admitting: Primary Care

## 2022-10-14 ENCOUNTER — Ambulatory Visit: Payer: Medicare Other

## 2022-10-14 ENCOUNTER — Other Ambulatory Visit: Payer: Self-pay | Admitting: Primary Care

## 2022-10-14 ENCOUNTER — Telehealth: Payer: Self-pay | Admitting: Neurology

## 2022-10-14 DIAGNOSIS — Z1231 Encounter for screening mammogram for malignant neoplasm of breast: Secondary | ICD-10-CM

## 2022-10-14 NOTE — Telephone Encounter (Signed)
Pt is asking that her son(on DPR) be called and the appointment be gone over with him, re: Dr Rhea Belton recommendations going forward.

## 2022-10-15 NOTE — Progress Notes (Deleted)
Psychiatric Initial Adult Assessment   Patient Identification: Katherine [name] MRN:  [id number] Date of Evaluation:  10/15/2022 Referral Source: *** Chief Complaint:  No chief complaint on file.  Visit Diagnosis: No diagnosis found.  History of Present Illness:   Katherine [name] is a 72 y.o. year old female with a history of parkinsons disease, lewy body dementia, chronic migraine, hypertension, OSA, who is referred for depression.  - he was seen by neurologist for second opinion for LBD and FTD.  MOCA 25/30. He was referred for neuropsychiatry evaluation.  - there is an extensive evaluation, including DR. Hemang, Dr. Kern Alberta at [facility] clinic.  - CSF Alzheimer's biomarker was negative, a beta 42 more than 1700, total 12-18, phosphorylated tau 20.1,--- conclusion was unable to calculate the p-Tau/Abeta 42 ratio because the measured A beta 42 concentration is above the measuring limit of 1700 PG /ml, normal concentration of a beta 42 present in this individual is not consistent with the presence of pathological change associated with Alzheimer's disease,   - Laboratory, normal TSH, methylmalonic acid level, PET CT brain metabolic evaluation showed hypometabolism involving both cerebral hemispheres, basal ganglia and brainstem. MRI of the brain mild generalized parenchymal volume loss, without any particular lobar predominance, mild small vessel disease, - she was placed on pimavanserin 34 mg qhs  Genetic counseling telemetry medication by [facility] clinic on October 26, 2021, significant mental illness history, father was diagnosed with bipolar in his 44s, history of traumatic brain injury at 63, required mental hospital stay, passed away in his 69s, paternal uncle completed suicide at age 6, history of alcoholism, paternal aunt died at age 68 due to complication of pneumonia, prescription medication addition,     Associated Signs/Symptoms: Depression Symptoms:  {DEPRESSION SYMPTOMS:20000} (Hypo)  Manic Symptoms:  {BHH MANIC SYMPTOMS:22872} Anxiety Symptoms:  {BHH ANXIETY SYMPTOMS:22873} Psychotic Symptoms:  {BHH PSYCHOTIC SYMPTOMS:22874} PTSD Symptoms: {BHH PTSD SYMPTOMS:22875}  Past Psychiatric History:  Outpatient:  Psychiatry admission:  Previous suicide attempt:  Past trials of medication:  History of violence:  History of head injury:   Previous Psychotropic Medications: {YES/NO:21197}  Substance Abuse History in the last 12 months:  {yes no:314532}  Consequences of Substance Abuse: {BHH CONSEQUENCES OF SUBSTANCE ABUSE:22880}  Past Medical History:  Past Medical History:  Diagnosis Date   Anemia 2018   Anxiety    Arthritis    Borderline diabetes    Cataract    Chest pain    a. 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 [facility]   Depression    Dyspnea on exertion    a. 03/2009 Echo: EF 65%, no rwma, triv TR.   GERD (gastroesophageal reflux disease)    High cholesterol    Hypertension    Hypokalemia    Migraine headache    Obesity    Parkinson's disease    Pre-diabetes    okay now    Tachycardia     Past Surgical History:  Procedure Laterality Date   BLADDER SUSPENSION  2005   CARDIAC CATHETERIZATION     CESAREAN SECTION     CLOSED REDUCTION NASAL FRACTURE N/A 01/11/2020   Procedure: CLOSED REDUCTION NASAL FRACTURE;  Surgeon: Linus Salmons, MD;  Location: [facility];  Service: ENT;  Laterality: N/A;   ENDOMETRIAL ABLATION  2007   FOOT SURGERY  bilateral bunions   LUMBAR LAMINECTOMY/DECOMPRESSION MICRODISCECTOMY Bilateral 01/12/2016   Procedure: Bilateral L4-5 Laminotomy/Foraminotomy;  Surgeon: Tressie Stalker, MD;  Location: [facility];  Service: Neurosurgery;  Laterality: Bilateral;  Bilateral L4-5 Laminotomy/Foraminotomy   TONSILLECTOMY  1971    Family Psychiatric  History: ***  Family History:  Family History  Problem Relation Age of Onset   Heart disease Mother    Hypertension Mother    Heart failure Mother        CHF   Diabetes Mother    Cancer Mother        CERVICAL CANCER   Atrial fibrillation Mother    Heart disease Father    Diabetes Father    Rashes / Skin problems Father    Hypertension Father    Cirrhosis Father        liver disease non alcoholic   Diabetes Sister    Breast cancer Sister 85   Diabetes Mellitus II Sister    Heart disease Brother    Heart failure Brother        CHF   Diabetes Brother    Cancer Brother 3       THROAT AND NECK   Esophageal cancer Brother    Lung cancer Brother    Atrial fibrillation Brother    Cancer Maternal Grandfather    Colon cancer Neg Hx    Stomach cancer Neg Hx    Rectal cancer Neg Hx     Social History:   Social History   Socioeconomic History   Marital status: Married    Spouse name: Not on file   Number of children: 1   Years of education: Not on file   Highest education level: Not on file  Occupational History   Occupation: Magazine features editor: [employer]    Comment: retired-special needs teacher  Tobacco Use   Smoking status: Never   Smokeless tobacco: Never  Vaping Use   Vaping Use: Never used  Substance and Sexual Activity   Alcohol use: No   Drug use: No   Sexual activity: Yes    Partners: Male    Birth control/protection: Post-menopausal  Other Topics Concern   Not on file  Social History Narrative   Married.   One son is 58, lives in [place].   Retired from Agricultural consultant.   Enjoys substitute teaching, gardening, cooking.   Social Determinants of Health   Financial Resource Strain: Low Risk  (07/30/2022)   Overall Financial Resource Strain (CARDIA)    Difficulty of Paying Living Expenses: Not hard at all  Food Insecurity: No Food Insecurity (07/30/2022)   Hunger Vital Sign    Worried About Running Out of Food in the Last Year: Never true     Ran Out of Food in the Last Year: Never true  Transportation Needs: No Transportation Needs (07/30/2022)   PRAPARE - Administrator, Civil Service (Medical): No    Lack of Transportation (Non-Medical): No  Physical Activity: Unknown (07/30/2022)   Exercise Vital Sign    Days of Exercise per Week: 0 days    Minutes of Exercise per Session: Not on file  Stress: Stress Concern Present (07/30/2022)   [employer] of Occupational Health - Occupational Stress Questionnaire    Feeling of Stress : To some extent  Social Connections: Unknown (07/30/2022)   Social Connection and Isolation Panel [NHANES]    Frequency of Communication with Friends and Family: Not on file    Frequency of Social  Gatherings with Friends and Family: Not on file    Attends Religious Services: Not on file    Active Member of Clubs or Organizations: Not on file    Attends Banker Meetings: Not on file    Marital Status: Married    Additional Social History: ***  Allergies:   Allergies  Allergen Reactions   Chlorine Other (See Comments)    Smell causes lightheadedness.   Dilaudid [Hydromorphone]     hallucinations   Codeine Anxiety   Darvon Anxiety   Hydrocodone Anxiety   Meperidine Anxiety   Oxycodone Anxiety   Tramadol Anxiety   Victoza [Liraglutide] Nausea Only and Other (See Comments)    And weakness    Metabolic Disorder Labs: Lab Results  Component Value Date   HGBA1C 5.9 (H) 07/28/2022   MPG 120 01/09/2016   MPG 131 (H) 09/10/2014   No results found for: "PROLACTIN" Lab Results  Component Value Date   CHOL 211 (H) 03/10/2021   TRIG 266.0 (H) 03/10/2021   HDL 57.40 03/10/2021   CHOLHDL 4 03/10/2021   VLDL 53.2 (H) 03/10/2021   LDLCALC 60 04/15/2020   LDLCALC 118 (H) 07/09/2019   Lab Results  Component Value Date   TSH 1.180 12/02/2021    Therapeutic Level Labs: No results found for: "LITHIUM" No results found for: "CBMZ" No results found for:  "VALPROATE"  Current Medications: Current Outpatient Medications  Medication Sig Dispense Refill   betamethasone dipropionate 0.05 % lotion SMARTSIG:Sparingly Topical Twice Daily     DULoxetine (CYMBALTA) 30 MG capsule Take 30 mg by mouth daily.     DULoxetine (CYMBALTA) 60 MG capsule Take 60 mg by mouth every morning. Takes with her 30mg  capsule to equal 90 mg daily     famotidine (PEPCID) 20 MG tablet TAKE 1 TABLET (20 MG TOTAL) BY MOUTH DAILY. FOR HEARTBURN. 90 tablet 1   gabapentin (NEURONTIN) 300 MG capsule Take 1 capsule (300 mg total) by mouth at bedtime. For back pain. 90 capsule 0   levocetirizine (XYZAL) 5 MG tablet Take 1 tablet (5 mg total) by mouth at bedtime as needed for allergies. 90 tablet 3   losartan (COZAAR) 25 MG tablet Take 1 tablet (25 mg total) by mouth daily. 90 tablet 0   Multiple Vitamin (MULTIVITAMIN) tablet Take 1 tablet by mouth daily.     NUPLAZID 34 MG CAPS Take 1 capsule by mouth daily.     OVER THE COUNTER MEDICATION [employer] vitamin     OVER THE COUNTER MEDICATION 2 tablets daily. Air Shield     pantoprazole (PROTONIX) 40 MG tablet TAKE 1 TABLET (40 MG TOTAL) BY MOUTH 2 (TWO) TIMES DAILY BEFORE A MEAL. FOR HEARTBURN. 180 tablet 1   propranolol (INDERAL) 20 MG tablet TAKE 1 TABLET (20 MG TOTAL) BY MOUTH 3 (THREE) TIMES DAILY AS NEEDED. FOR HEARTRATE >110 BPM 90 tablet 2   spironolactone (ALDACTONE) 25 MG tablet TAKE 1 TABLET (25 MG TOTAL) BY MOUTH DAILY. 90 tablet 0   SUMAtriptan (IMITREX) 100 MG tablet Take 100 mg by mouth as needed.     tamsulosin (FLOMAX) 0.4 MG CAPS capsule Take 0.4 mg by mouth daily.     topiramate (TOPAMAX) 50 MG tablet Take 1 tablet (50 mg total) by mouth daily.     Vitamin D, Ergocalciferol, (DRISDOL) 1.25 MG (50000 UNIT) CAPS capsule TAKE 1 CAPSULE BY MOUTH ONCE WEEKLY FOR 12 WEEKS. 12 capsule 0   No current facility-administered medications for this visit.  Musculoskeletal: Strength & Muscle Tone: within normal limits Gait  & Station: normal Patient leans: N/A  Psychiatric Specialty Exam: Review of Systems  There were no vitals taken for this visit.There is no height or weight on file to calculate BMI.  General Appearance: {Appearance:22683}  Eye Contact:  {BHH EYE CONTACT:22684}  Speech:  Clear and Coherent  Volume:  Normal  Mood:  {BHH MOOD:22306}  Affect:  {Affect (PAA):22687}  Thought Process:  Coherent  Orientation:  Full (Time, Place, and Person)  Thought Content:  Logical  Suicidal Thoughts:  {ST/HT (PAA):22692}  Homicidal Thoughts:  {ST/HT (PAA):22692}  Memory:  Immediate;   Good  Judgement:  {Judgement (PAA):22694}  Insight:  {Insight (PAA):22695}  Psychomotor Activity:  Normal  Concentration:  Concentration: Good and Attention Span: Good  Recall:  Good  Fund of Knowledge:Good  Language: Good  Akathisia:  No  Handed:  Right  AIMS (if indicated):  not done  Assets:  Communication Skills Desire for Improvement  ADL's:  Intact  Cognition: WNL  Sleep:  {BHH GOOD/FAIR/POOR:22877}   Screenings: GAD-7    Flowsheet Row Office Visit from 09/20/2022 in [facility]  Total GAD-7 Score 8      Mini-Mental    Flowsheet Row Clinical Support from 07/06/2018 in [facility]  Total Score (max 30 points ) 20      PHQ2-9    Flowsheet Row Office Visit from 09/20/2022 in [facility] Clinical Support from 07/30/2022 in [facility] Video Visit from 02/24/2021 in [facility] at [facility] Office Visit from 04/15/2020 in [facility] Clinical Support from 07/09/2019 in [facility] [place]  PHQ-2 Total Score 2 0 1 1 6   PHQ-9 Total Score 5 -- 10 10 9       Flowsheet Row ED from 12/02/2021 in [facility] Emergency Department at [facility] ED from 05/11/2021 in [facility]  ED from 02/27/2021 in [facility] at [facility]   C-SSRS RISK CATEGORY No Risk No Risk No Risk       Assessment and Plan:  Assessment  Plan   The patient demonstrates the following risk factors for suicide: Chronic risk factors for suicide include: {Chronic Risk Factors for ZOXWRUE:45409811}. Acute risk factors for suicide include: {Acute Risk Factors for BJYNWGN:56213086}. Protective factors for this patient include: {Protective Factors for Suicide VHQI:69629528}. Considering these factors, the overall suicide risk at this point appears to be {Desc; low/moderate/high:110033}. Patient {ACTION; IS/IS UXL:24401027} appropriate for outpatient follow up.   Collaboration of Care: {BH OP Collaboration of Care:21014065}  Patient/Guardian was advised Release of Information must be obtained prior to any record release in order to collaborate their care with an outside provider. Patient/Guardian was advised if they have not already done so to contact the registration department to sign all necessary forms in order for [place] to release information regarding their care.   Consent: Patient/Guardian gives verbal consent for treatment and assignment of benefits for services provided during this visit. Patient/Guardian expressed understanding and agreed to proceed.   Neysa Hotter, MD 3/29/20243:10 PM

## 2022-10-18 ENCOUNTER — Telehealth: Payer: Self-pay | Admitting: Cardiovascular Disease

## 2022-10-18 NOTE — Telephone Encounter (Signed)
Pt c/o medication issue:  1. Name of Medication: losartan (COZAAR) 25 MG tablet   2. How are you currently taking this medication (dosage and times per day)? Take 1 tablet (25 mg total) by mouth daily.   3. Are you having a reaction (difficulty breathing--STAT)? No   4. What is your medication issue? Medication makes patient lethargic and bad headaches. Very sluggish and lowered BP too much. Tried cutting it in half and still had same effect. Wants to know if Dr. Rockey Situ or nurse have any other suggestions.

## 2022-10-18 NOTE — Telephone Encounter (Signed)
Left a message for the patient to call back.  

## 2022-10-19 ENCOUNTER — Ambulatory Visit: Payer: Medicare Other

## 2022-10-19 ENCOUNTER — Ambulatory Visit: Payer: Medicare Other | Admitting: Psychiatry

## 2022-10-19 ENCOUNTER — Inpatient Hospital Stay: Payer: Medicare Other

## 2022-10-19 ENCOUNTER — Ambulatory Visit: Payer: Medicare Other | Admitting: Internal Medicine

## 2022-10-19 NOTE — Telephone Encounter (Signed)
Left voicemail message to call back  

## 2022-10-19 NOTE — Telephone Encounter (Signed)
Pt returning nurses call. Pease advise ?

## 2022-10-19 NOTE — Telephone Encounter (Signed)
Patient is returning call.  °

## 2022-10-19 NOTE — Telephone Encounter (Signed)
Spoke with patient and inquired if she had tried taking it at bedtime. She try taking half tablet but continued . She had severe fatigued, and states that it has made her blood pressures and heart rates too low. Inquired what her blood pressures have been & heart rates. She reports one reading was  110/60 with heart rates of 80's. Reviewed that these numbers looked great and the normal ranges. She verbalized understanding and states she is just not able to take this medication. Advised that I will send to provider for review and I will be in touch with his recommendations.

## 2022-10-20 ENCOUNTER — Inpatient Hospital Stay: Payer: Medicare Other

## 2022-10-20 ENCOUNTER — Inpatient Hospital Stay: Payer: Medicare Other | Attending: Internal Medicine

## 2022-10-20 ENCOUNTER — Ambulatory Visit: Payer: Medicare Other | Admitting: Internal Medicine

## 2022-10-20 ENCOUNTER — Ambulatory Visit: Payer: Medicare Other | Admitting: Primary Care

## 2022-10-20 DIAGNOSIS — Z79899 Other long term (current) drug therapy: Secondary | ICD-10-CM | POA: Insufficient documentation

## 2022-10-20 DIAGNOSIS — E611 Iron deficiency: Secondary | ICD-10-CM | POA: Insufficient documentation

## 2022-10-20 LAB — CBC WITH DIFFERENTIAL/PLATELET
Abs Immature Granulocytes: 0.03 10*3/uL (ref 0.00–0.07)
Basophils Absolute: 0.1 10*3/uL (ref 0.0–0.1)
Basophils Relative: 1 %
Eosinophils Absolute: 0.3 10*3/uL (ref 0.0–0.5)
Eosinophils Relative: 3 %
HCT: 47 % — ABNORMAL HIGH (ref 36.0–46.0)
Hemoglobin: 15.1 g/dL — ABNORMAL HIGH (ref 12.0–15.0)
Immature Granulocytes: 0 %
Lymphocytes Relative: 18 %
Lymphs Abs: 2.2 10*3/uL (ref 0.7–4.0)
MCH: 30.5 pg (ref 26.0–34.0)
MCHC: 32.1 g/dL (ref 30.0–36.0)
MCV: 94.9 fL (ref 80.0–100.0)
Monocytes Absolute: 0.8 10*3/uL (ref 0.1–1.0)
Monocytes Relative: 7 %
Neutro Abs: 8.9 10*3/uL — ABNORMAL HIGH (ref 1.7–7.7)
Neutrophils Relative %: 71 %
Platelets: 269 10*3/uL (ref 150–400)
RBC: 4.95 MIL/uL (ref 3.87–5.11)
RDW: 13.1 % (ref 11.5–15.5)
WBC: 12.3 10*3/uL — ABNORMAL HIGH (ref 4.0–10.5)
nRBC: 0 % (ref 0.0–0.2)

## 2022-10-20 LAB — COMPREHENSIVE METABOLIC PANEL
ALT: 13 U/L (ref 0–44)
AST: 17 U/L (ref 15–41)
Albumin: 4.3 g/dL (ref 3.5–5.0)
Alkaline Phosphatase: 122 U/L (ref 38–126)
Anion gap: 9 (ref 5–15)
BUN: 12 mg/dL (ref 8–23)
CO2: 22 mmol/L (ref 22–32)
Calcium: 9.1 mg/dL (ref 8.9–10.3)
Chloride: 106 mmol/L (ref 98–111)
Creatinine, Ser: 0.89 mg/dL (ref 0.44–1.00)
GFR, Estimated: >60 mL/min (ref >60)
Glucose, Bld: 107 mg/dL — ABNORMAL HIGH (ref 70–99)
Potassium: 3.7 mmol/L (ref 3.5–5.1)
Sodium: 137 mmol/L (ref 135–145)
Total Bilirubin: 0.3 mg/dL (ref 0.3–1.2)
Total Protein: 7.1 g/dL (ref 6.5–8.1)

## 2022-10-20 LAB — IRON AND TIBC
Iron: 37 ug/dL (ref 28–170)
Saturation Ratios: 11 % (ref 10.4–31.8)
TIBC: 325 ug/dL (ref 250–450)
UIBC: 288 ug/dL

## 2022-10-20 LAB — FERRITIN: Ferritin: 124 ng/mL (ref 11–307)

## 2022-10-20 MED ORDER — AMLODIPINE BESYLATE 5 MG PO TABS: 5.0000 mg | ORAL_TABLET | Freq: Every day | ORAL | 3 refills | Status: DC

## 2022-10-20 NOTE — Telephone Encounter (Signed)
Spoke with patient and reviewed provider recommendations. She verbalized understanding, confirmed upcoming appointment, and had no further questions at this time.    Kate Sable, MD  SentEmmie Niemann October 20, 2022  8:29 AM  To: Valora Corporal, RN; Minna Merritts, MD    Stop losartan, start norvasc 5mg  daily

## 2022-10-21 ENCOUNTER — Telehealth: Payer: Self-pay | Admitting: *Deleted

## 2022-10-21 ENCOUNTER — Other Ambulatory Visit: Payer: Self-pay | Admitting: Primary Care

## 2022-10-21 DIAGNOSIS — K219 Gastro-esophageal reflux disease without esophagitis: Secondary | ICD-10-CM

## 2022-10-21 NOTE — Telephone Encounter (Signed)
Patient called asking if she needs to keep her appointment tomorrow based on jher lab reslts today Please notify patient   Ferritin Order: [id number] Status: Final result     Visible to patient: No (inaccessible in [place])     Next appt: 10/22/2022 at 03:15 PM in Oncology Cammie Sickle, MD)     Dx: Iron deficiency   0 Result Notes          Component Ref Range & Units 1 d ago (10/20/22) 1 mo ago (09/20/22) 4 mo ago (06/18/22) 8 mo ago (02/05/22) 1 yr ago (06/04/21) 1 yr ago (03/10/21) 2 yr ago (08/14/20)  Ferritin 11 - 307 ng/mL 124 131.1 R 222 R 32.2 R 4.3 Low  R 10.7 R 23.9 R  Comment: Performed at [facility], [address] [place], [place] [address]  Resulting Agency [facility] CL[facility] LAB [place]         Specimen Collected: 10/20/22 16:02 Last Resulted: 10/20/22 17:29      Lab Flowsheet      Order Details      View Encounter      Lab and Collection Details      Routing      Result History    View All Conversations on this Encounter      R=Reference range differs from displayed range      Result Care Coordination   Patient Communication   Add Comments   Not seen Back to Top      Other Results from 10/20/2022  Iron and TIBC([facility]) Order: [id number] Status: Final result      Visible to patient: No (inaccessible in [place])      Next appt: 10/22/2022 at 03:15 PM in Oncology Cammie Sickle, MD)      Dx: Iron deficiency    0 Result Notes            Component Ref Range & Units 1 d ago (10/20/22) 1 mo ago (09/20/22) 4 mo ago (06/18/22) 8 mo ago (02/05/22) 1 yr ago (06/04/21) 1 yr ago (03/10/21) 2 yr ago (08/14/20)  Iron 28 - 170 ug/dL 37 58 R 58 R 16 Low  R 26 Low  R 101 R 70 R  TIBC 250 - 450 ug/dL 325 338.8 R 266 R 418.6 R 474.6 High  R 387.8 R   Saturation Ratios 10.4 - 31.8 % 11 17.1 Low  R  3.8 Low  R 5.5 Low  R 26.0 R  18.6 Low  R  UIBC ug/dL 288        Comment: Performed at [facility], [address] [place], [place] [address]  Resulting Agency [facility] CLIN [place]         Specimen Collected: 10/20/22 16:02 Last Resulted: 10/20/22 17:29      CBC with Differential Order: [id number] Status: Final result     Visible to patient: No (inaccessible in [place])     Next appt: 10/22/2022 at 03:15 PM in Oncology Cammie Sickle, MD)     Dx: Iron deficiency   0 Result Notes          Component Ref Range & Units 1 d ago (10/20/22) 1 mo ago (09/20/22) 2 mo ago (08/13/22) 2 mo ago (07/28/22) 4 mo ago (06/18/22) 8 mo ago (02/12/22) 8 mo ago (02/05/22)  WBC 4.0 - 10.5 K/uL  12.3 High  12.1 High  8.8 R 11.1 High  R 11.8 High  R 9.4   RBC 3.87 - 5.11 MIL/uL 4.95 4.79 R 4.86 R 4.87 R 4.72 R 4.77 R   Hemoglobin 12.0 - 15.0 g/dL 15.1 High  15.1 High  15.1 R 15.2 R 14.8 R 13.9   HCT 36.0 - 46.0 % 47.0 High  45.4 44.7 R 45.2 R 43.3 R 43.6   MCV 80.0 - 100.0 fL 94.9 94.8 R 92.0 93 R 91.7 91.3 R   MCH 26.0 - 34.0 pg 30.5  31.1 R 31.2 R 31.4 R    MCHC 30.0 - 36.0 g/dL 32.1 33.2 33.8 R 33.6 R 34.2 R 31.9   RDW 11.5 - 15.5 % 13.1 13.4 12.4 R 13.0 R 14.4 R 13.9   Platelets 150 - 400 K/uL 269 353.0 R 322 R 317 R 302 R 284.0 R   nRBC 0.0 - 0.2 % 0.0        Neutrophils Relative % % 71  65 71 R 69.7 64.6 R 87.9 High  R  Neutro Abs 1.7 - 7.7 K/uL 8.9 High   5,720 R 7.9 High  R 8,225 High  R 6.1 R   Lymphocytes Relative % 18     18.9 R 4.7 Low  R  Lymphs Abs 0.7 - 4.0 K/uL 2.2  2,059 R 2.1 R 2,266 R 1.8   Monocytes Relative % 7  6.7  7.5 8.4 R 6.4 R  Monocytes Absolute 0.1 - 1.0 K/uL 0.8     0.8   Eosinophils Relative % 3  4.2  3.1 7.4 High  R 0.7 R  Eosinophils Absolute 0.0 - 0.5 K/uL 0.3  370 R  366 R 0.7 R   Basophils Relative % 1  0.7  0.5 0.7 R 0.3 R  Basophils Absolute 0.0 - 0.1 K/uL  0.1  62 R 0.1 R 59 R 0.1   Immature Granulocytes % 0   0 R     Abs Immature Granulocytes 0.00 - 0.07 K/uL 0.03        Comment: Performed at [facility], [address] [place], [place] [address]  MPV   11.2 R  10.8 R    Immature Grans (Abs)    0.0 R     Band Neutrophils       0.0 R  Absolute Monocytes   590 R  885 R    Total Lymphocyte   23.4 R  19.2 R    Lymphs    19 R     Monocytes    6 R     Eos    3 R     Basos    1 R     Monocytes Absolute    0.7 R     EOS (ABSOLUTE)    0.3 R     Resulting Agency [facility] CL[facility] LAB [place] [facility] [place] [facility]         Specimen Collected: 10/20/22 16:02 Last Resulted: 10/20/22 16:20      Comprehensive metabolic panel Order: [id number] Status: Final result     Visible to patient: No (inaccessible in MyChart)     Next appt: 10/22/2022 at 03:15 PM in Oncology Cammie Sickle, MD)     Dx: Iron deficiency   0 Result Notes          Component Ref Range & Units 1 d ago (10/20/22) 1 mo  ago (09/20/22) 2 mo ago (08/13/22) 2 mo ago (07/28/22) 8 mo ago (02/05/22) 10 mo ago (12/02/21) 1 yr ago (06/04/21)  Sodium 135 - 145 mmol/L 137 141 R  140 R 142 R 140 139 R  Potassium 3.5 - 5.1 mmol/L 3.7 3.7 R  3.9 R 3.7 R 3.6 4.0 R  Chloride 98 - 111 mmol/L 106 107 R  106 R 109 R 109 106 R  CO2 22 - 32 mmol/L 22 25 R  19 Low  R 23 R 22 24 R  Glucose, Bld 70 - 99 mg/dL 107 High  85  107 High  167 High  143 High  CM 74  Comment: Glucose reference range applies only to samples taken after fasting for at least 8 hours.  BUN 8 - 23 mg/dL 12 14 R  12 R 14 R 14 15 R  Creatinine, Ser 0.44 - 1.00 mg/dL 0.89 0.92 R  0.91 R 0.79 R 0.82 0.92 R  Calcium 8.9 - 10.3 mg/dL 9.1 9.7 R  9.9 R 9.2 R 8.8 Low  9.1 R  Total Protein 6.5 - 8.1 g/dL 7.1 7.1 R 6.7 R 6.8 R     Albumin 3.5 - 5.0 g/dL 4.3 4.0 R  4.1 R     AST 15 - 41 U/L 17 13 R 12 R 9 R     ALT 0 - 44 U/L 13 10 R  10 R 10 R     Alkaline Phosphatase 38 - 126 U/L 122 123 High  R  162 High  R     Total Bilirubin 0.3 - 1.2 mg/dL 0.3 0.3 R 0.2 R 0.2 R     GFR, Estimated >60 mL/min >60     >60 CM   Comment: (NOTE) Calculated using the CKD-EPI Creatinine Equation (2021)  Anion gap 5 - 15 9     9  CM   Comment: Performed at [facility], [address] [place], [place] [address]  Resulting Agency [facility] CLIN [place] [facility] CL[facility] LAB [place] [facility]         Specimen Collected: 10/20/22 16:02 Last Resulted: 10/20/22 16:30

## 2022-10-22 ENCOUNTER — Inpatient Hospital Stay: Payer: Medicare Other | Admitting: Internal Medicine

## 2022-10-22 ENCOUNTER — Inpatient Hospital Stay: Payer: Medicare Other

## 2022-10-22 ENCOUNTER — Encounter: Payer: Self-pay | Admitting: Internal Medicine

## 2022-10-22 MED FILL — Ferumoxytol Inj 510 MG/17ML (30 MG/ML) (Elemental Fe): INTRAVENOUS | Qty: 17 | Status: AC

## 2022-10-29 ENCOUNTER — Other Ambulatory Visit: Payer: Self-pay | Admitting: Primary Care

## 2022-10-29 ENCOUNTER — Ambulatory Visit: Payer: Medicare Other | Admitting: Primary Care

## 2022-10-29 DIAGNOSIS — E559 Vitamin D deficiency, unspecified: Secondary | ICD-10-CM

## 2022-11-02 DIAGNOSIS — R3914 Feeling of incomplete bladder emptying: Secondary | ICD-10-CM | POA: Diagnosis not present

## 2022-11-02 DIAGNOSIS — N312 Flaccid neuropathic bladder, not elsewhere classified: Secondary | ICD-10-CM | POA: Diagnosis not present

## 2022-11-08 ENCOUNTER — Ambulatory Visit: Payer: Medicare Other | Admitting: Cardiology

## 2022-11-16 DIAGNOSIS — B86 Scabies: Secondary | ICD-10-CM | POA: Diagnosis not present

## 2022-11-16 DIAGNOSIS — L738 Other specified follicular disorders: Secondary | ICD-10-CM | POA: Diagnosis not present

## 2022-11-16 DIAGNOSIS — L718 Other rosacea: Secondary | ICD-10-CM | POA: Diagnosis not present

## 2022-11-16 DIAGNOSIS — L218 Other seborrheic dermatitis: Secondary | ICD-10-CM | POA: Diagnosis not present

## 2022-11-19 DIAGNOSIS — F3130 Bipolar disorder, current episode depressed, mild or moderate severity, unspecified: Secondary | ICD-10-CM | POA: Diagnosis not present

## 2022-11-19 DIAGNOSIS — G3183 Dementia with Lewy bodies: Secondary | ICD-10-CM | POA: Diagnosis not present

## 2022-11-25 ENCOUNTER — Telehealth: Payer: Self-pay | Admitting: Neurology

## 2022-11-25 NOTE — Telephone Encounter (Signed)
Pt called stating that she is wanting to know if the provider can write a letter stating the findings of the Neurological Exam given to her that proves she does not have Dementia. Pt is needing this letter so that her husband can not gain Power of Attorney over her. Please advise.

## 2022-11-26 ENCOUNTER — Ambulatory Visit: Payer: Medicare Other

## 2022-11-29 NOTE — Telephone Encounter (Signed)
Please let patient know, she has access to her medical record through MyChart, in addition, I have referred her to formal neuropsychological evaluations  I would not generate separate letter

## 2022-11-30 ENCOUNTER — Ambulatory Visit: Payer: Medicare Other

## 2022-11-30 NOTE — Telephone Encounter (Signed)
Pt called back, message from Dr Terrace Arabia was relayed.  No call back requested.

## 2022-12-03 ENCOUNTER — Ambulatory Visit: Payer: Medicare Other | Admitting: Primary Care

## 2022-12-03 ENCOUNTER — Encounter: Payer: Self-pay | Admitting: Primary Care

## 2022-12-03 ENCOUNTER — Ambulatory Visit (INDEPENDENT_AMBULATORY_CARE_PROVIDER_SITE_OTHER): Payer: Medicare Other | Admitting: Primary Care

## 2022-12-03 VITALS — BP 140/82 | HR 88 | Temp 98.1°F | Ht 62.0 in | Wt 173.0 lb

## 2022-12-03 DIAGNOSIS — E611 Iron deficiency: Secondary | ICD-10-CM

## 2022-12-03 DIAGNOSIS — G3109 Other frontotemporal dementia: Secondary | ICD-10-CM

## 2022-12-03 DIAGNOSIS — D509 Iron deficiency anemia, unspecified: Secondary | ICD-10-CM

## 2022-12-03 DIAGNOSIS — F0280 Dementia in other diseases classified elsewhere without behavioral disturbance: Secondary | ICD-10-CM

## 2022-12-03 DIAGNOSIS — R21 Rash and other nonspecific skin eruption: Secondary | ICD-10-CM

## 2022-12-03 MED ORDER — METRONIDAZOLE 0.75 % EX GEL
1.0000 | Freq: Two times a day (BID) | CUTANEOUS | 0 refills | Status: DC | PRN
Start: 2022-12-03 — End: 2023-02-08

## 2022-12-03 NOTE — Assessment & Plan Note (Signed)
Reviewed neurology notes from March 2024. She will follow-up with neuropsychology for further testing as scheduled in early June.

## 2022-12-03 NOTE — Progress Notes (Signed)
Subjective:    Patient ID: Katherine [name], female    DOB: 25-May-1951, 72 y.o.   MRN: [id number]  HPI  Katherine [name] is a very pleasant 72 y.o. female with a history of frontotemporal dementia, chronic migraines, recurrent cystitis, prediabetes, iron deficiency anemia, scalp irritation who presents today to discuss scalp irritation.   Her rash is located to the entire face and scalp which began in February 2024. She knows that her rash is from insects in her home.  She mentions that there are "numerous poisonous moths" in her home that were brought in by a woman for which she suspects is having an affair with her husband. She can hear them buzzing in her ear at night. She can see these insects flying around at night, some light up. She sees the dead insects in the floor in the morning. She's had several exterminators come out to her home to spray, including another one this weekend.  She is continued to be bothered by "this woman" that continues to break into her home to have an affair with her husband. "This woman" has brought in boxes of these poisonous moths and various insects for the purpose of biting her. She has replaced her cell phones several times as "this woman: kept hacking into her cell phones. She is now on a Boost phone and "this woman" has been able to attack her Boost phone now.   Seen for scalp irritation secondary to "insect bites" on multiple occasions from several providers in our office, and also follows with dermatology for the same.  Last dermatology visit was in late April, was prescribed Doxycycline for her current rash which caused her to"break out in hives" to her face. She discontinued doxycycline, called back to dermatology, and was told that there was no other treatment for her. She has another appointment scheduled in July.   She's tried several topical creams including the latest, betamethasone dipropionate 0.05%,  without improvement. She denies changes  in any medications, foods, treatments.   She would also like her iron levels checked as she feel that they may be low.  Her appetite has decreased and she is not eating much.  She also mentions a recent evaluation by a new neurologist for a second opinion of her diagnosis of frontotemporal dementia, and "I was given a clean bill of health".  She discusses that the neurologist told her that she does not have dementia but has referred her for neuropsychology testing.  She is also undergoing several psychological tests through the court system as her husband is attempting to gain guardianship.  She does not believe that she is incompetent and she does not believe that she has dementia.  Wt Readings from Last 3 Encounters:  12/03/22 173 lb (78.5 kg)  10/05/22 180 lb 8 oz (81.9 kg)  09/20/22 183 lb (83 kg)     Review of Systems  Constitutional:  Positive for appetite change and fatigue.  Respiratory:  Negative for shortness of breath.   Cardiovascular:  Negative for chest pain.  Skin:  Positive for rash.         Past Medical History:  Diagnosis Date   Anemia 2018   Anxiety    Arthritis    Borderline diabetes    Cataract    Chest pain    a. 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 [facility]   Depression    Dyspnea on exertion    a. 03/2009 Echo: EF 65%, no rwma, triv TR.   GERD (gastroesophageal reflux disease)    High cholesterol    Hypertension    Hypokalemia    Migraine headache    Obesity    Parkinson's disease    Pre-diabetes    okay now    Tachycardia     Social History   Socioeconomic History   Marital status: Married    Spouse name: Not on file   Number of children: 1   Years of education: Not on file   Highest education level: Not on file  Occupational History   Occupation: Administrator, arts: [employer]    Comment: retired-special needs teacher  Tobacco Use   Smoking status: Never   Smokeless tobacco: Never  Vaping Use   Vaping Use: Never used  Substance and Sexual Activity   Alcohol use: No   Drug use: No   Sexual activity: Yes    Partners: Male    Birth control/protection: Post-menopausal  Other Topics Concern   Not on file  Social History Narrative   Married.   One son is 34, lives in [place].   Retired from Agricultural consultant.   Enjoys substitute teaching, gardening, cooking.   Social Determinants of Health   Financial Resource Strain: Low Risk  (07/30/2022)   Overall Financial Resource Strain (CARDIA)    Difficulty of Paying Living Expenses: Not hard at all  Food Insecurity: No Food Insecurity (07/30/2022)   Hunger Vital Sign    Worried About Running Out of Food in the Last Year: Never true    Ran Out of Food in the Last Year: Never true  Transportation Needs: No Transportation Needs (07/30/2022)   PRAPARE - Administrator, Civil Service (Medical): No    Lack of Transportation (Non-Medical): No  Physical Activity: Unknown (07/30/2022)   Exercise Vital Sign    Days of Exercise per Week: 0 days    Minutes of Exercise per Session: Not on file  Stress: Stress Concern Present (07/30/2022)   [employer] of Occupational Health - Occupational Stress Questionnaire    Feeling of Stress : To some extent  Social Connections: Unknown (07/30/2022)   Social Connection and Isolation Panel [NHANES]    Frequency of Communication with Friends and Family: Not on file    Frequency of Social Gatherings with Friends and Family: Not on file    Attends Religious Services: Not on file    Active Member of Clubs or Organizations: Not on file    Attends Banker Meetings: Not on file    Marital Status: Married  Intimate Partner Violence: Not At Risk (07/30/2022)   Humiliation, Afraid, Rape, and Kick questionnaire    Fear of Current or  Ex-Partner: No    Emotionally Abused: No    Physically Abused: No    Sexually Abused: No    Past Surgical History:  Procedure Laterality Date   BLADDER SUSPENSION  2005   CARDIAC CATHETERIZATION     CESAREAN SECTION     CLOSED REDUCTION NASAL FRACTURE N/A 01/11/2020   Procedure: CLOSED REDUCTION NASAL FRACTURE;  Surgeon: Linus Salmons, MD;  Location: [facility];  Service: ENT;  Laterality: N/A;   ENDOMETRIAL ABLATION  2007   FOOT SURGERY     bilateral bunions   LUMBAR LAMINECTOMY/DECOMPRESSION MICRODISCECTOMY Bilateral 01/12/2016  Procedure: Bilateral L4-5 Laminotomy/Foraminotomy;  Surgeon: Tressie Stalker, MD;  Location: [facility];  Service: Neurosurgery;  Laterality: Bilateral;  Bilateral L4-5 Laminotomy/Foraminotomy   TONSILLECTOMY  1971    Family History  Problem Relation Age of Onset   Heart disease Mother    Hypertension Mother    Heart failure Mother        CHF   Diabetes Mother    Cancer Mother        CERVICAL CANCER   Atrial fibrillation Mother    Heart disease Father    Diabetes Father    Rashes / Skin problems Father    Hypertension Father    Cirrhosis Father        liver disease non alcoholic   Diabetes Sister    Breast cancer Sister 41   Diabetes Mellitus II Sister    Heart disease Brother    Heart failure Brother        CHF   Diabetes Brother    Cancer Brother 16       THROAT AND NECK   Esophageal cancer Brother    Lung cancer Brother    Atrial fibrillation Brother    Cancer Maternal Grandfather    Colon cancer Neg Hx    Stomach cancer Neg Hx    Rectal cancer Neg Hx     Allergies  Allergen Reactions   Chlorine Other (See Comments)    Smell causes lightheadedness.   Dilaudid [Hydromorphone]     hallucinations   Codeine Anxiety   Darvon Anxiety   Hydrocodone Anxiety   Meperidine Anxiety   Oxycodone Anxiety   Tramadol Anxiety   Victoza [Liraglutide] Nausea Only and Other (See Comments)    And weakness    Current  Outpatient Medications on File Prior to Visit  Medication Sig Dispense Refill   amLODipine (NORVASC) 5 MG tablet Take 1 tablet (5 mg total) by mouth daily. (Patient not taking: Reported on 12/03/2022) 90 tablet 3   DULoxetine (CYMBALTA) 30 MG capsule Take 30 mg by mouth daily.     DULoxetine (CYMBALTA) 60 MG capsule Take 60 mg by mouth every morning. Takes with her 30mg  capsule to equal 90 mg daily     famotidine (PEPCID) 20 MG tablet TAKE 1 TABLET (20 MG TOTAL) BY MOUTH DAILY. FOR HEARTBURN. 90 tablet 3   gabapentin (NEURONTIN) 300 MG capsule Take 1 capsule (300 mg total) by mouth at bedtime. For back pain. 90 capsule 0   levocetirizine (XYZAL) 5 MG tablet Take 1 tablet (5 mg total) by mouth at bedtime as needed for allergies. 90 tablet 3   Multiple Vitamin (MULTIVITAMIN) tablet Take 1 tablet by mouth daily.     NUPLAZID 34 MG CAPS Take 1 capsule by mouth daily.     OVER THE COUNTER MEDICATION [employer] vitamin     OVER THE COUNTER MEDICATION 2 tablets daily. Air Shield     pantoprazole (PROTONIX) 40 MG tablet TAKE 1 TABLET (40 MG TOTAL) BY MOUTH 2 (TWO) TIMES DAILY BEFORE A MEAL. FOR HEARTBURN. 180 tablet 1   propranolol (INDERAL) 20 MG tablet TAKE 1 TABLET (20 MG TOTAL) BY MOUTH 3 (THREE) TIMES DAILY AS NEEDED. FOR HEARTRATE >110 BPM 90 tablet 2   spironolactone (ALDACTONE) 25 MG tablet TAKE 1 TABLET (25 MG TOTAL) BY MOUTH DAILY. 90 tablet 0   tamsulosin (FLOMAX) 0.4 MG CAPS capsule Take 0.4 mg by mouth daily.     topiramate (TOPAMAX) 50 MG tablet Take 1 tablet (50 mg  total) by mouth daily.     betamethasone dipropionate 0.05 % lotion SMARTSIG:Sparingly Topical Twice Daily (Patient not taking: Reported on 12/03/2022)     SUMAtriptan (IMITREX) 100 MG tablet Take 100 mg by mouth as needed.     Vitamin D, Ergocalciferol, (DRISDOL) 1.25 MG (50000 UNIT) CAPS capsule TAKE 1 CAPSULE BY MOUTH ONCE WEEKLY FOR 12 WEEKS. (Patient not taking: Reported on 12/03/2022) 12 capsule 0   No current  facility-administered medications on file prior to visit.    BP (!) 140/82   Pulse 88   Temp 98.1 F (36.7 C) (Temporal)   Ht 5\' 2"  (1.575 m)   Wt 173 lb (78.5 kg)   SpO2 95%   BMI 31.64 kg/m  Objective:   Physical Exam Cardiovascular:     Rate and Rhythm: Normal rate and regular rhythm.  Pulmonary:     Effort: Pulmonary effort is normal.     Breath sounds: Normal breath sounds.  Musculoskeletal:     Cervical back: Neck supple.  Skin:    General: Skin is warm and dry.     Findings: Rash present.     Comments: Numerous small red bumps covering entire face. Few scabbed appearing lesions to scalp.           Assessment & Plan:  Rash Assessment & Plan: Unclear etiology today.   Unable to take Doxycycline. Will try topical antibiotic as it seems as though that is the direction her dermatologist is going.  Start metronidazole 0.75% gel, BID PRN.  I've asked that she contact her dermatologist again for other options.   Orders: -     metroNIDAZOLE; Apply 1 Application topically 2 (two) times daily as needed.  Dispense: 45 g; Refill: 0  Iron deficiency anemia, unspecified iron deficiency anemia type -     CBC with Differential/Platelet -     Iron, TIBC and Ferritin Panel  Iron deficiency Assessment & Plan: Reviewed iron levels and CBC from April 2024 with patient. She would like levels rechecked today.  CBC and iron studies ordered and pending.   Frontotemporal dementia [facility]) Assessment & Plan: Reviewed neurology notes from March 2024. She will follow-up with neuropsychology for further testing as scheduled in early June.        45 minutes was spent face-to-face with patient discussing her concerns, reviewing her chart, and ordering medications/labs.  See assessment and plan.   Doreene Nest, NP

## 2022-12-03 NOTE — Assessment & Plan Note (Signed)
Reviewed iron levels and CBC from April 2024 with patient. She would like levels rechecked today.  CBC and iron studies ordered and pending.

## 2022-12-03 NOTE — Patient Instructions (Addendum)
Stop by the lab prior to leaving today. I will notify you of your results once received.   Apply the metronidazole gel twice daily to a clean face for about 1 week.  Please contact your dermatologist regarding the rash.  It was a pleasure to see you today!

## 2022-12-03 NOTE — Assessment & Plan Note (Signed)
Unclear etiology today.   Unable to take Doxycycline. Will try topical antibiotic as it seems as though that is the direction her dermatologist is going.  Start metronidazole 0.75% gel, BID PRN.  I've asked that she contact her dermatologist again for other options.

## 2022-12-04 LAB — CBC WITH DIFFERENTIAL/PLATELET
Absolute Monocytes: 598 cells/uL (ref 200–950)
Basophils Absolute: 44 cells/uL (ref 0–200)
Basophils Relative: 0.5 %
Eosinophils Absolute: 343 cells/uL (ref 15–500)
Eosinophils Relative: 3.9 %
HCT: 45 % (ref 35.0–45.0)
Hemoglobin: 14.9 g/dL (ref 11.7–15.5)
Lymphs Abs: 2077 cells/uL (ref 850–3900)
MCH: 29.8 pg (ref 27.0–33.0)
MCHC: 33.1 g/dL (ref 32.0–36.0)
MCV: 90 fL (ref 80.0–100.0)
MPV: 11.1 fL (ref 7.5–12.5)
Monocytes Relative: 6.8 %
Neutro Abs: 5738 cells/uL (ref 1500–7800)
Neutrophils Relative %: 65.2 %
Platelets: 297 10*3/uL (ref 140–400)
RBC: 5 10*6/uL (ref 3.80–5.10)
RDW: 12.5 % (ref 11.0–15.0)
Total Lymphocyte: 23.6 %
WBC: 8.8 10*3/uL (ref 3.8–10.8)

## 2022-12-04 LAB — IRON,TIBC AND FERRITIN PANEL
%SAT: 23 % (calc) (ref 16–45)
Ferritin: 95 ng/mL (ref 16–288)
Iron: 66 ug/dL (ref 45–160)
TIBC: 288 mcg/dL (calc) (ref 250–450)

## 2022-12-08 ENCOUNTER — Other Ambulatory Visit: Payer: Self-pay | Admitting: Primary Care

## 2022-12-08 DIAGNOSIS — M4316 Spondylolisthesis, lumbar region: Secondary | ICD-10-CM

## 2022-12-08 DIAGNOSIS — F32A Depression, unspecified: Secondary | ICD-10-CM

## 2022-12-13 NOTE — Progress Notes (Deleted)
Psychiatric Initial Adult Assessment   Patient Identification: Katherine [name] MRN:  [id number] Date of Evaluation:  12/13/2022 Referral Source: *** Chief Complaint:  No chief complaint on file.  Visit Diagnosis: No diagnosis found.  History of Present Illness:   Katherine [name] is a 72 y.o. year old female with a history of parkinson disease, lewy body dementia, chronic migraine, hypertension, OSA, who is referred for depression .  According to the chart review, she was seen by neurologist for second opinion for LBD and FTD. MOCA 25/30. He was referred for neuropsychiatry evaluation.  - she was evaluated by Dr. Clydia Llano, Dr. Kern Alberta at [facility] clinic.    Duloxetine 120 mg daily, bupropion 150 mg daily    Associated Signs/Symptoms: Depression Symptoms:  {DEPRESSION SYMPTOMS:20000} (Hypo) Manic Symptoms:  {BHH MANIC SYMPTOMS:22872} Anxiety Symptoms:  {BHH ANXIETY SYMPTOMS:22873} Psychotic Symptoms:  {BHH PSYCHOTIC SYMPTOMS:22874} PTSD Symptoms: {BHH PTSD SYMPTOMS:22875}  Past Psychiatric History:  Outpatient:  Psychiatry admission:  Previous suicide attempt:  Past trials of medication:  History of violence:  History of head injury:   Previous Psychotropic Medications: {YES/NO:21197}  Substance Abuse History in the last 12 months:  {yes no:314532}  Consequences of Substance Abuse: {BHH CONSEQUENCES OF SUBSTANCE ABUSE:22880}  Past Medical History:  Past Medical History:  Diagnosis Date   Anemia 2018   Anxiety    Arthritis    Borderline diabetes    Cataract    Chest pain    a. Normal coronaries by cath 2007; Anomalous RCA arising from LAD diagonal (versus total native RCA with collateral)   Chicken pox    age 27   Chronic back pain    Compressed discs. [facility]   Depression    Dyspnea on exertion    a. 03/2009 Echo: EF 65%, no rwma, triv TR.   GERD (gastroesophageal reflux disease)    High cholesterol    Hypertension    Hypokalemia     Migraine headache    Obesity    Parkinson's disease    Pre-diabetes    okay now    Tachycardia     Past Surgical History:  Procedure Laterality Date   BLADDER SUSPENSION  2005   CARDIAC CATHETERIZATION     CESAREAN SECTION     CLOSED REDUCTION NASAL FRACTURE N/A 01/11/2020   Procedure: CLOSED REDUCTION NASAL FRACTURE;  Surgeon: Linus Salmons, MD;  Location: [facility];  Service: ENT;  Laterality: N/A;   ENDOMETRIAL ABLATION  2007   FOOT SURGERY     bilateral bunions   LUMBAR LAMINECTOMY/DECOMPRESSION MICRODISCECTOMY Bilateral 01/12/2016   Procedure: Bilateral L4-5 Laminotomy/Foraminotomy;  Surgeon: Tressie Stalker, MD;  Location: [facility];  Service: Neurosurgery;  Laterality: Bilateral;  Bilateral L4-5 Laminotomy/Foraminotomy   TONSILLECTOMY  1971    Family Psychiatric History: ***  Family History:  Family History  Problem Relation Age of Onset   Heart disease Mother    Hypertension Mother    Heart failure Mother        CHF   Diabetes Mother    Cancer Mother        CERVICAL CANCER   Atrial fibrillation Mother    Heart disease Father    Diabetes Father    Rashes / Skin problems Father    Hypertension Father    Cirrhosis Father        liver disease non alcoholic   Diabetes Sister    Breast cancer Sister 65   Diabetes Mellitus II Sister    Heart  disease Brother    Heart failure Brother        CHF   Diabetes Brother    Cancer Brother 47       THROAT AND NECK   Esophageal cancer Brother    Lung cancer Brother    Atrial fibrillation Brother    Cancer Maternal Grandfather    Colon cancer Neg Hx    Stomach cancer Neg Hx    Rectal cancer Neg Hx     Social History:   Social History   Socioeconomic History   Marital status: Married    Spouse name: Not on file   Number of children: 1   Years of education: Not on file   Highest education level: Not on file  Occupational History   Occupation: Magazine features editor: [employer]     Comment: retired-special needs teacher  Tobacco Use   Smoking status: Never   Smokeless tobacco: Never  Vaping Use   Vaping Use: Never used  Substance and Sexual Activity   Alcohol use: No   Drug use: No   Sexual activity: Yes    Partners: Male    Birth control/protection: Post-menopausal  Other Topics Concern   Not on file  Social History Narrative   Married.   One son is 97, lives in [place].   Retired from Agricultural consultant.   Enjoys substitute teaching, gardening, cooking.   Social Determinants of Health   Financial Resource Strain: Low Risk  (07/30/2022)   Overall Financial Resource Strain (CARDIA)    Difficulty of Paying Living Expenses: Not hard at all  Food Insecurity: No Food Insecurity (07/30/2022)   Hunger Vital Sign    Worried About Running Out of Food in the Last Year: Never true    Ran Out of Food in the Last Year: Never true  Transportation Needs: No Transportation Needs (07/30/2022)   PRAPARE - Administrator, Civil Service (Medical): No    Lack of Transportation (Non-Medical): No  Physical Activity: Unknown (07/30/2022)   Exercise Vital Sign    Days of Exercise per Week: 0 days    Minutes of Exercise per Session: Not on file  Stress: Stress Concern Present (07/30/2022)   [employer] of Occupational Health - Occupational Stress Questionnaire    Feeling of Stress : To some extent  Social Connections: Unknown (07/30/2022)   Social Connection and Isolation Panel [NHANES]    Frequency of Communication with Friends and Family: Not on file    Frequency of Social Gatherings with Friends and Family: Not on file    Attends Religious Services: Not on file    Active Member of Clubs or Organizations: Not on file    Attends Banker Meetings: Not on file    Marital Status: Married    Additional Social History: ***  Allergies:   Allergies  Allergen Reactions   Chlorine Other (See Comments)    Smell causes lightheadedness.   Dilaudid  [Hydromorphone]     hallucinations   Codeine Anxiety   Darvon Anxiety   Hydrocodone Anxiety   Meperidine Anxiety   Oxycodone Anxiety   Tramadol Anxiety   Victoza [Liraglutide] Nausea Only and Other (See Comments)    And weakness    Metabolic Disorder Labs: Lab Results  Component Value Date   HGBA1C 5.9 (H) 07/28/2022   MPG 120 01/09/2016   MPG 131 (H) 09/10/2014   No results found for: "PROLACTIN" Lab Results  Component Value Date   CHOL 211 (  H) 03/10/2021   TRIG 266.0 (H) 03/10/2021   HDL 57.40 03/10/2021   CHOLHDL 4 03/10/2021   VLDL 53.2 (H) 03/10/2021   LDLCALC 60 04/15/2020   LDLCALC 118 (H) 07/09/2019   Lab Results  Component Value Date   TSH 1.180 12/02/2021    Therapeutic Level Labs: No results found for: "LITHIUM" No results found for: "CBMZ" No results found for: "VALPROATE"  Current Medications: Current Outpatient Medications  Medication Sig Dispense Refill   amLODipine (NORVASC) 5 MG tablet Take 1 tablet (5 mg total) by mouth daily. (Patient not taking: Reported on 12/03/2022) 90 tablet 3   betamethasone dipropionate 0.05 % lotion SMARTSIG:Sparingly Topical Twice Daily (Patient not taking: Reported on 12/03/2022)     DULoxetine (CYMBALTA) 30 MG capsule Take 30 mg by mouth daily.     DULoxetine (CYMBALTA) 60 MG capsule Take 60 mg by mouth every morning. Takes with her 30mg  capsule to equal 90 mg daily     famotidine (PEPCID) 20 MG tablet TAKE 1 TABLET (20 MG TOTAL) BY MOUTH DAILY. FOR HEARTBURN. 90 tablet 3   gabapentin (NEURONTIN) 300 MG capsule TAKE 1 CAPSULE (300 MG TOTAL) BY MOUTH AT BEDTIME. FOR BACK PAIN. 90 capsule 1   levocetirizine (XYZAL) 5 MG tablet Take 1 tablet (5 mg total) by mouth at bedtime as needed for allergies. 90 tablet 3   metroNIDAZOLE (METROGEL) 0.75 % gel Apply 1 Application topically 2 (two) times daily as needed. 45 g 0   Multiple Vitamin (MULTIVITAMIN) tablet Take 1 tablet by mouth daily.     NUPLAZID 34 MG CAPS Take 1 capsule  by mouth daily.     OVER THE COUNTER MEDICATION [employer] vitamin     OVER THE COUNTER MEDICATION 2 tablets daily. Air Shield     pantoprazole (PROTONIX) 40 MG tablet TAKE 1 TABLET (40 MG TOTAL) BY MOUTH 2 (TWO) TIMES DAILY BEFORE A MEAL. FOR HEARTBURN. 180 tablet 1   propranolol (INDERAL) 20 MG tablet TAKE 1 TABLET (20 MG TOTAL) BY MOUTH 3 (THREE) TIMES DAILY AS NEEDED. FOR HEARTRATE >110 BPM 90 tablet 2   spironolactone (ALDACTONE) 25 MG tablet TAKE 1 TABLET (25 MG TOTAL) BY MOUTH DAILY. 90 tablet 0   SUMAtriptan (IMITREX) 100 MG tablet Take 100 mg by mouth as needed.     tamsulosin (FLOMAX) 0.4 MG CAPS capsule Take 0.4 mg by mouth daily.     topiramate (TOPAMAX) 50 MG tablet Take 1 tablet (50 mg total) by mouth daily.     Vitamin D, Ergocalciferol, (DRISDOL) 1.25 MG (50000 UNIT) CAPS capsule TAKE 1 CAPSULE BY MOUTH ONCE WEEKLY FOR 12 WEEKS. (Patient not taking: Reported on 12/03/2022) 12 capsule 0   No current facility-administered medications for this visit.    Musculoskeletal: Strength & Muscle Tone: within normal limits Gait & Station: normal Patient leans: N/A  Psychiatric Specialty Exam: Review of Systems  There were no vitals taken for this visit.There is no height or weight on file to calculate BMI.  General Appearance: {Appearance:22683}  Eye Contact:  {BHH EYE CONTACT:22684}  Speech:  Clear and Coherent  Volume:  Normal  Mood:  {BHH MOOD:22306}  Affect:  {Affect (PAA):22687}  Thought Process:  Coherent  Orientation:  Full (Time, Place, and Person)  Thought Content:  Logical  Suicidal Thoughts:  {ST/HT (PAA):22692}  Homicidal Thoughts:  {ST/HT (PAA):22692}  Memory:  Immediate;   Good  Judgement:  {Judgement (PAA):22694}  Insight:  {Insight (PAA):22695}  Psychomotor Activity:  Normal  Concentration:  Concentration: Good and Attention Span: Good  Recall:  Good  Fund of Knowledge:Good  Language: Good  Akathisia:  No  Handed:  Right  AIMS (if indicated):  not done   Assets:  Communication Skills Desire for Improvement  ADL's:  Intact  Cognition: WNL  Sleep:  {BHH GOOD/FAIR/POOR:22877}   Screenings: GAD-7    Flowsheet Row [facility]20/20[facility]  Total GAD-7 Score 8      Mini-Mental    Flowsheet Row Clinical Support from 07/06/2018 in [facility]  Total Score (max 30 points ) 20      PHQ2-9    Flowsheet Row Office Visit from 09/20/2022 in [facility] Clinical Support from 07/30/2022 in [facility] Video Visit from 02/24/2021 in [facility] at [facility] Office Visit from 04/15/2020 in [facility] Clinical Support from 07/09/2019 in [facility] [place]  PHQ-2 Total Score 2 0 1 1 6   PHQ-9 Total Score 5 -- 10 10 9       Flowsheet Row ED from 12/02/2021 in [facility] at [facility] ED from 05/11/2021 in [facility] at [facility]  ED from 02/27/2021 in [facility] at [facility]   C-SSRS RISK CATEGORY No Risk No Risk No Risk       Assessment and Plan:  Assessment  Plan   The patient demonstrates the following risk factors for suicide: Chronic risk factors for suicide include: {Chronic Risk Factors for ZOXWRUE:45409811}. Acute risk factors for suicide include: {Acute Risk Factors for BJYNWGN:56213086}. Protective factors for this patient include: {Protective Factors for Suicide VHQI:69629528}. Considering these factors, the overall suicide risk at this point appears to be {Desc; low/moderate/high:110033}. Patient {ACTION; IS/IS UXL:24401027} appropriate for outpatient follow up.   Collaboration of Care: {BH OP Collaboration of Care:21014065}  Patient/Guardian was advised Release of Information must be obtained prior to any record release in order to  collaborate their care with an outside provider. Patient/Guardian was advised if they have not already done so to contact the registration department to sign all necessary forms in order for [place] to release information regarding their care.   Consent: Patient/Guardian gives verbal consent for treatment and assignment of benefits for services provided during this visit. Patient/Guardian expressed understanding and agreed to proceed.   Neysa Hotter, MD 5/27/20244:31 PM

## 2022-12-21 ENCOUNTER — Ambulatory Visit: Payer: Medicare Other | Admitting: Psychiatry

## 2022-12-21 DIAGNOSIS — F99 Mental disorder, not otherwise specified: Secondary | ICD-10-CM | POA: Diagnosis not present

## 2022-12-31 ENCOUNTER — Ambulatory Visit: Payer: Medicare Other

## 2023-01-02 ENCOUNTER — Other Ambulatory Visit: Payer: Self-pay | Admitting: Cardiovascular Disease

## 2023-01-02 DIAGNOSIS — I10 Essential (primary) hypertension: Secondary | ICD-10-CM

## 2023-01-02 DIAGNOSIS — R079 Chest pain, unspecified: Secondary | ICD-10-CM

## 2023-01-05 NOTE — Telephone Encounter (Signed)
Patient's husband sent a MyChart message requesting a new referral for psychiatry.  Patient would like to move to someone closer to to [place].  Referral placed. See My chat messages below that were copied and pasted from her husband's MyChart portal. Husband is Rhae Hammock.  "Dear Jae Dire, [facility] you're doing well. Bev saw Dr. Terrace Arabia in [facility] (March); Dr. Zannie Cove exam revealed that Bev does NOT have Dementia. Therefore, could you send a NEW referral to [facility] in [place] so that she can get an appt there? She would like to be seen for depression and anxiety, rather than continuing w/ the NP in [place] she has been seeing. (We both have lost confidence in the NP in [place]) Thanks so much!"  "Hi Mark and Bev,   I reviewed Dr. Zannie Cove notes and Dr. Terrace Arabia preferred for Bev to see a neuropsychologist for formal dementia testing. It looks like appointments with Dr. Terrace Arabia have been cancelled for 01/11/23 and 02/08/23.    Is there an appointment scheduled with the neuropsychologist?    It looks like I referred Bev to a psychiatrist in December 2023. I see notes where an appointment was cancelled on 12/16/22 that was scheduled for 12/21/22. Do you or Bev know anything about this?   Thanks! Mayra Reel, NP-C"  "Dear Jae Dire, Yes, we are aware of all of this.  It has been an exhausting past few months for Bev, to put it mildly.  Dr. Terrace Arabia DID test Bev for Dementia, as well as going over her previous MRI's and testing, as well as her previous diagnoses.  She told Bev, "There is nothing wrong with your brain." Dr. Terrace Arabia did suggest to Bev that she could see Dr Bascom Levels for a "work-up" due to her family history and her depression & anxiety, not for Dementia testing.   Jae Dire, I believe that you remember that I had petitioned the Court to become Legal Guardian for Bev back in March. This hearing was held last wk, 6/11, and the Court ruled that Bev was Competent and not in need of a Guardian.  Therefore, Bev, feeling  exhausted from the pressure of the Court matter, did cancel the appointments with Dr. Bascom Levels (and the subsequent follow-up appt. with Dr. Terrace Arabia) for the time being.  She feels only a sense of relief now, and wants to get back to our normal day-to-day living situation.  However, as we mentioned before, the NP in Psychiatry located in [place] who Bev has seen for more than 20 years has become increasingly busy.  Both Bev and I think that this  is a good time for her to start with a new Mental Health Care professional who is closer by and more readily accessible.   I feel certain that you can understand my concern for Bev's well-being, as well as my relief, after her testing in Dr. Zannie Cove office, as well as Court-ordered testing that Bev complied with.  She"   "Jae Dire,  Could you please refer Bev to someone in the [place] network (preferably local to our area)? Thanks again,"

## 2023-01-06 ENCOUNTER — Other Ambulatory Visit: Payer: Self-pay | Admitting: Cardiovascular Disease

## 2023-01-10 ENCOUNTER — Ambulatory Visit: Payer: Medicare Other | Admitting: Internal Medicine

## 2023-01-11 ENCOUNTER — Ambulatory Visit: Payer: Medicare Other | Admitting: Neurology

## 2023-01-13 ENCOUNTER — Other Ambulatory Visit: Payer: Self-pay | Admitting: Primary Care

## 2023-01-13 DIAGNOSIS — F32A Depression, unspecified: Secondary | ICD-10-CM

## 2023-01-14 ENCOUNTER — Ambulatory Visit: Payer: Medicare Other

## 2023-01-14 ENCOUNTER — Other Ambulatory Visit: Payer: Self-pay | Admitting: Primary Care

## 2023-01-14 DIAGNOSIS — F32A Depression, unspecified: Secondary | ICD-10-CM

## 2023-01-21 ENCOUNTER — Ambulatory Visit: Payer: Medicare Other

## 2023-01-21 ENCOUNTER — Ambulatory Visit: Payer: Medicare Other | Admitting: Primary Care

## 2023-01-26 ENCOUNTER — Ambulatory Visit: Payer: Medicare Other | Admitting: Primary Care

## 2023-01-27 ENCOUNTER — Encounter: Payer: Self-pay | Admitting: Primary Care

## 2023-01-31 ENCOUNTER — Ambulatory Visit: Payer: Medicare Other | Admitting: Primary Care

## 2023-02-04 ENCOUNTER — Ambulatory Visit: Payer: Medicare Other | Admitting: Primary Care

## 2023-02-08 ENCOUNTER — Encounter: Payer: Self-pay | Admitting: Primary Care

## 2023-02-08 ENCOUNTER — Ambulatory Visit: Payer: Medicare Other | Admitting: Neurology

## 2023-02-08 ENCOUNTER — Ambulatory Visit (INDEPENDENT_AMBULATORY_CARE_PROVIDER_SITE_OTHER): Payer: Medicare Other | Admitting: Primary Care

## 2023-02-08 VITALS — BP 128/76 | HR 103 | Temp 97.3°F | Ht 62.0 in | Wt 163.0 lb

## 2023-02-08 DIAGNOSIS — F0280 Dementia in other diseases classified elsewhere without behavioral disturbance: Secondary | ICD-10-CM

## 2023-02-08 DIAGNOSIS — F32A Depression, unspecified: Secondary | ICD-10-CM | POA: Diagnosis not present

## 2023-02-08 DIAGNOSIS — G3109 Other frontotemporal dementia: Secondary | ICD-10-CM

## 2023-02-08 DIAGNOSIS — G43709 Chronic migraine without aura, not intractable, without status migrainosus: Secondary | ICD-10-CM

## 2023-02-08 DIAGNOSIS — F418 Other specified anxiety disorders: Secondary | ICD-10-CM | POA: Insufficient documentation

## 2023-02-08 DIAGNOSIS — F411 Generalized anxiety disorder: Secondary | ICD-10-CM

## 2023-02-08 MED ORDER — TOPIRAMATE 50 MG PO TABS
ORAL_TABLET | ORAL | 1 refills | Status: AC
Start: 2023-02-08 — End: ?

## 2023-02-08 MED ORDER — BUSPIRONE HCL 5 MG PO TABS
5.0000 mg | ORAL_TABLET | Freq: Two times a day (BID) | ORAL | 0 refills | Status: DC
Start: 2023-02-08 — End: 2023-03-31

## 2023-02-08 MED ORDER — SUMATRIPTAN SUCCINATE 100 MG PO TABS
ORAL_TABLET | ORAL | 0 refills | Status: DC
Start: 2023-02-08 — End: 2023-05-25

## 2023-02-08 NOTE — Patient Instructions (Signed)
We increased the dose of your Topamax.  Take 1 tablet by mouth every morning and 2 tablets by mouth every evening for headache prevention.  Use the sumatriptan as needed for migraine abortion.  Start buspirone 5 mg for anxiety.  Take 1 tablet by mouth once daily for 5 to 7 days, then increase to 1 tablet by mouth twice daily thereafter if needed.  Please update me in 3 to 4 weeks.  It was a pleasure to see you today!

## 2023-02-08 NOTE — Addendum Note (Signed)
Addended by: Doreene Nest on: 02/08/2023 04:37 PM   Modules accepted: Orders

## 2023-02-08 NOTE — Progress Notes (Addendum)
Subjective:    Patient ID: Katherine [name], female    DOB: Jun 08, 1951, 72 y.o.   MRN: [id number]  HPI  Katherine [name] is a very pleasant 72 y.o. female with a history of anxiety and depression, frontotemporal dementia, chronic back pain, migraines, recurrent cystitis who presents today to discuss anxiety and depression. She would also like to discuss migraines.   Her husband joins [place] today  1) Anxiety and Depression/Insomina:Currently managed on Cymbalta 90 mg daily, propranolol 20 mg as needed for tachycardia.  Previously following with [name], psychiatry but has recently been referred to a local psychiatrist. She has yet to be contacted regarding a psychiatry referral.   She has an appointment with neuropsychologist [place] for late August.   She is requesting something to help with sleep and treatment of anxiety. She has difficulty falling and staying asleep. She will toss and turn at night. She's been taking Excedrin PM, melatonin OTC without improvement. She's failed Trazodone. She has been managed on Valium which helped.   Also with symptoms of worrying and feeling anxious. She feels that Cymbalta helps with depression, but not with anxiety.  She cannot sleep at night due to mind racing thoughts.  2) Migraines: Chronic for years. Currently managed on Topamax 100 mg for migraine prevention and Imitrex PRN for migraine abortion. Historically, this regimen worked well except for the last 3-4 months.   Symptoms occur to the bilateral temporal region and bilateral neck. She also experiences photophobia, phonophobia, nausea with migraines. Migraines are now occurring 2-3 days weekly lasting for hours.   Previously following with headache specialist for management, last visit was Fall 2023, recommendation was for Botox injections for which she didn't want.   BP Readings from Last 3 Encounters:  02/08/23 128/76  12/03/22 (!) 140/82  10/05/22 (!) 150/78     Review of  Systems  Constitutional:  Positive for fatigue.  Eyes:  Positive for photophobia.  Respiratory:  Negative for shortness of breath.   Cardiovascular:  Negative for palpitations.  Neurological:  Positive for headaches.  Psychiatric/Behavioral:  Positive for sleep disturbance. The patient is nervous/anxious.          Past Medical History:  Diagnosis Date   Anemia 2018   Anxiety    Arthritis    Borderline diabetes    Cataract    Chest pain    a. Normal coronaries by cath 2007; Anomalous RCA arising from LAD diagonal (versus total native RCA with collateral)   Chicken pox    age 34   Chronic back pain    Compressed discs. [facility]   Depression    Dyspnea on exertion    a. 03/2009 Echo: EF 65%, no rwma, triv TR.   GERD (gastroesophageal reflux disease)    High cholesterol    Hypertension    Hypokalemia    Migraine headache    Obesity    Parkinson's disease    Pre-diabetes    okay now    Tachycardia     Social History   Socioeconomic History   Marital status: Married    Spouse name: Not on file   Number of children: 1   Years of education: Not on file   Highest education level: Not on file  Occupational History   Occupation: Magazine features editor: [employer]    Comment: retired-special needs teacher  Tobacco Use   Smoking status: Never   Smokeless tobacco: Never  Vaping Use  Vaping status: Never Used  Substance and Sexual Activity   Alcohol use: No   Drug use: No   Sexual activity: Yes    Partners: Male    Birth control/protection: Post-menopausal  Other Topics Concern   Not on file  Social History Narrative   Married.   One son is 92, lives in [place].   Retired from Agricultural consultant.   Enjoys substitute teaching, gardening, cooking.   Social Determinants of Health   Financial Resource Strain: Low Risk  (07/30/2022)   Overall Financial Resource Strain (CARDIA)    Difficulty of Paying Living Expenses: Not hard at all   Food Insecurity: No Food Insecurity (07/30/2022)   Hunger Vital Sign    Worried About Running Out of Food in the Last Year: Never true    Ran Out of Food in the Last Year: Never true  Transportation Needs: No Transportation Needs (07/30/2022)   PRAPARE - Administrator, Civil Service (Medical): No    Lack of Transportation (Non-Medical): No  Physical Activity: Unknown (07/30/2022)   Exercise Vital Sign    Days of Exercise per Week: 0 days    Minutes of Exercise per Session: Not on file  Stress: Stress Concern Present (07/30/2022)   [employer] of Occupational Health - Occupational Stress Questionnaire    Feeling of Stress : To some extent  Social Connections: Unknown (07/30/2022)   Social Connection and Isolation Panel [NHANES]    Frequency of Communication with Friends and Family: Not on file    Frequency of Social Gatherings with Friends and Family: Not on file    Attends Religious Services: Not on file    Active Member of Clubs or Organizations: Not on file    Attends Banker Meetings: Not on file    Marital Status: Married  Intimate Partner Violence: Not At Risk (07/30/2022)   Humiliation, Afraid, Rape, and Kick questionnaire    Fear of Current or Ex-Partner: No    Emotionally Abused: No    Physically Abused: No    Sexually Abused: No    Past Surgical History:  Procedure Laterality Date   BLADDER SUSPENSION  2005   CARDIAC CATHETERIZATION     CESAREAN SECTION     CLOSED REDUCTION NASAL FRACTURE N/A 01/11/2020   Procedure: CLOSED REDUCTION NASAL FRACTURE;  Surgeon: Linus Salmons, MD;  Location: [facility];  Service: ENT;  Laterality: N/A;   ENDOMETRIAL ABLATION  2007   FOOT SURGERY     bilateral bunions   LUMBAR LAMINECTOMY/DECOMPRESSION MICRODISCECTOMY Bilateral 01/12/2016   Procedure: Bilateral L4-5 Laminotomy/Foraminotomy;  Surgeon: Tressie Stalker, MD;  Location: [facility];  Service: Neurosurgery;  Laterality: Bilateral;   Bilateral L4-5 Laminotomy/Foraminotomy   TONSILLECTOMY  1971    Family History  Problem Relation Age of Onset   Heart disease Mother    Hypertension Mother    Heart failure Mother        CHF   Diabetes Mother    Cancer Mother        CERVICAL CANCER   Atrial fibrillation Mother    Heart disease Father    Diabetes Father    Rashes / Skin problems Father    Hypertension Father    Cirrhosis Father        liver disease non alcoholic   Diabetes Sister    Breast cancer Sister 48   Diabetes Mellitus II Sister    Heart disease Brother    Heart failure Brother  CHF   Diabetes Brother    Cancer Brother 58       THROAT AND NECK   Esophageal cancer Brother    Lung cancer Brother    Atrial fibrillation Brother    Cancer Maternal Grandfather    Colon cancer Neg Hx    Stomach cancer Neg Hx    Rectal cancer Neg Hx     Allergies  Allergen Reactions   Chlorine Other (See Comments)    Smell causes lightheadedness.   Dilaudid [Hydromorphone]     hallucinations   Codeine Anxiety   Darvon Anxiety   Hydrocodone Anxiety   Meperidine Anxiety   Oxycodone Anxiety   Tramadol Anxiety   Victoza [Liraglutide] Nausea Only and Other (See Comments)    And weakness    Current Outpatient Medications on File Prior to Visit  Medication Sig Dispense Refill   amLODipine (NORVASC) 5 MG tablet Take 1 tablet (5 mg total) by mouth daily. (Patient not taking: Reported on 12/03/2022) 90 tablet 3   DULoxetine (CYMBALTA) 30 MG capsule Take 30 mg by mouth daily.     DULoxetine (CYMBALTA) 60 MG capsule Take 60 mg by mouth every morning. Takes with her 30mg  capsule to equal 90 mg daily     famotidine (PEPCID) 20 MG tablet TAKE 1 TABLET (20 MG TOTAL) BY MOUTH DAILY. FOR HEARTBURN. 90 tablet 3   gabapentin (NEURONTIN) 300 MG capsule TAKE 1 CAPSULE (300 MG TOTAL) BY MOUTH AT BEDTIME. FOR BACK PAIN. 90 capsule 1   NUPLAZID 34 MG CAPS Take 1 capsule by mouth daily.     OVER THE COUNTER MEDICATION 2 tablets  daily. Air Shield     pantoprazole (PROTONIX) 40 MG tablet TAKE 1 TABLET (40 MG TOTAL) BY MOUTH 2 (TWO) TIMES DAILY BEFORE A MEAL. FOR HEARTBURN. 180 tablet 1   propranolol (INDERAL) 20 MG tablet TAKE 1 TABLET (20 MG TOTAL) BY MOUTH 3 (THREE) TIMES DAILY AS NEEDED. FOR HEARTRATE >110 BPM 90 tablet 2   spironolactone (ALDACTONE) 25 MG tablet Take 1 tablet (25 mg total) by mouth daily. PLEASE SCHEDULE APPOINTMENT FOR FURTHER REFILLS. THANK YOU! 90 tablet 0   tamsulosin (FLOMAX) 0.4 MG CAPS capsule Take 0.4 mg by mouth daily.     levocetirizine (XYZAL) 5 MG tablet Take 1 tablet (5 mg total) by mouth at bedtime as needed for allergies. (Patient not taking: Reported on 02/08/2023) 90 tablet 3   Multiple Vitamin (MULTIVITAMIN) tablet Take 1 tablet by mouth daily. (Patient not taking: Reported on 02/08/2023)     OVER THE COUNTER MEDICATION Olly Beauty vitamin (Patient not taking: Reported on 02/08/2023)     Vitamin D, Ergocalciferol, (DRISDOL) 1.25 MG (50000 UNIT) CAPS capsule TAKE 1 CAPSULE BY MOUTH ONCE WEEKLY FOR 12 WEEKS. (Patient not taking: Reported on 12/03/2022) 12 capsule 0   No current facility-administered medications on file prior to visit.    BP 128/76   Pulse (!) 103   Temp (!) 97.3 F (36.3 C) (Temporal)   Ht 5\' 2"  (1.575 m)   Wt 163 lb (73.9 kg)   SpO2 97%   BMI 29.81 kg/m  Objective:   Physical Exam Cardiovascular:     Rate and Rhythm: Normal rate and regular rhythm.  Pulmonary:     Effort: Pulmonary effort is normal.     Breath sounds: Normal breath sounds.  Musculoskeletal:     Cervical back: Neck supple.  Skin:    General: Skin is warm and dry.  Neurological:  Mental Status: She is alert and oriented to person, place, and time.  Psychiatric:        Mood and Affect: Mood normal.           Assessment & Plan:  GAD (generalized anxiety disorder) Assessment & Plan: Uncontrolled.  Discussed options. Unfortunately, we do not have records from her current  psychiatrist.   Continue Cymbalta 90 mg daily. Referral placed for therapy.  Add buspirone 5 mg BID, start with once daily.  Consider titration increase. She will update.   Also consider mirtazapine HS.    Orders: -     busPIRone HCl; Take 1 tablet (5 mg total) by mouth 2 (two) times daily. For anxiety  Dispense: 180 tablet; Refill: 0 -     Ambulatory referral to Psychology -     Ambulatory referral to Psychiatry  Frontotemporal dementia [facility]) Assessment & Plan: Proceed with neuropsychologist evaluation.     Depression, unspecified depression type Assessment & Plan: Controlled per patient.  Will find out the hold up regarding her psychiatry referrals. Continue Cymbalta 90 mg daily.   Referral placed for psychology.   Orders: -     Ambulatory referral to Psychology -     Ambulatory referral to Psychiatry  Chronic migraine without aura without status migrainosus, not intractable Assessment & Plan: Uncontrolled.  Increase Topamax to 50 mg AM and 100 mg HS. Refill provided for sumatriptan 100 mg tablets to use as needed.  She will update.  Orders: -     SUMAtriptan Succinate; Take 1 tablet by mouth at migraine onset. Do not exceed 100 mg in 24 hours.  Dispense: 10 tablet; Refill: 0 -     Topiramate; Take 1 tablet by mouth every morning and 2 tablets at night for migraine prevention.  Dispense: 270 tablet; Refill: 1        Doreene Nest, NP

## 2023-02-08 NOTE — Assessment & Plan Note (Signed)
Proceed with neuropsychologist evaluation.

## 2023-02-08 NOTE — Assessment & Plan Note (Signed)
Uncontrolled.  Increase Topamax to 50 mg AM and 100 mg HS. Refill provided for sumatriptan 100 mg tablets to use as needed.  She will update.

## 2023-02-08 NOTE — Assessment & Plan Note (Signed)
Controlled per patient.  Will find out the hold up regarding her psychiatry referrals. Continue Cymbalta 90 mg daily.   Referral placed for psychology.

## 2023-02-08 NOTE — Assessment & Plan Note (Signed)
Uncontrolled.  Discussed options. Unfortunately, we do not have records from her current psychiatrist.   Continue Cymbalta 90 mg daily. Referral placed for therapy.  Add buspirone 5 mg BID, start with once daily.  Consider titration increase. She will update.   Also consider mirtazapine HS.

## 2023-03-01 ENCOUNTER — Ambulatory Visit: Payer: Medicare Other | Admitting: Primary Care

## 2023-03-02 ENCOUNTER — Ambulatory Visit: Payer: Medicare Other | Admitting: Family Medicine

## 2023-03-02 ENCOUNTER — Ambulatory Visit: Payer: Medicare Other | Admitting: Primary Care

## 2023-03-03 ENCOUNTER — Ambulatory Visit: Payer: Medicare Other | Admitting: Primary Care

## 2023-03-03 ENCOUNTER — Ambulatory Visit: Payer: Medicare Other | Admitting: Licensed Clinical Social Worker

## 2023-03-08 ENCOUNTER — Other Ambulatory Visit: Payer: Self-pay | Admitting: Primary Care

## 2023-03-08 DIAGNOSIS — G43709 Chronic migraine without aura, not intractable, without status migrainosus: Secondary | ICD-10-CM

## 2023-03-11 ENCOUNTER — Telehealth: Payer: Self-pay | Admitting: Cardiovascular Disease

## 2023-03-11 NOTE — Telephone Encounter (Signed)
Pt c/o BP issue: STAT if pt c/o blurred vision, one-sided weakness or slurred speech  1. What are your last 5 BP readings? 122/62 and 120/60- she only had these readings- she said this was low for her  2. Are you having any other symptoms (ex. Dizziness, headache, blurred vision, passed out)? Headaches, severe weight loss and no appetite  3. What is your BP issue? Low blood pressure- she wants an appointment- made her appointment with Eula Listen on 03-18-23

## 2023-03-11 NOTE — Telephone Encounter (Signed)
Call placed to the patient. She stated that she was not sure what medications she should be taking. We did go over all of medications. Cardiac wise she is only taking the Spironolactone 25 mg once daily.  Her blood pressures today were 122/62 and 120/60. She has been advised that those were good blood pressures.  She stated that she has been having a lot of anxiety for the past 4 months and has lost 50 pounds. She does not take her anxiety medication anymore. She has a follow up with PCP in September. She wants to see cardiology to address theses issues but has been advised that she will need to see PCP. She has been advised to try and get a sooner appointment with her PCP to address her anxiety symptoms.   She has a follow up with cardiology on 8/30 and has been advised to bring her medications to her appointment to help verify what she has bene taking.

## 2023-03-15 ENCOUNTER — Ambulatory Visit: Payer: Medicare Other | Admitting: Neurology

## 2023-03-15 NOTE — Progress Notes (Deleted)
Cardiology Office Note    Date:  03/15/2023   ID:  Katherine, [name] 19-Dec-1950, MRN [id number]  PCP:  Doreene Nest, NP  Cardiologist:  Julien Nordmann, MD  Electrophysiologist:  None   Chief Complaint: ***  History of Present Illness:   Katherine [name] is a 72 y.o. female with history of Lewy body dementia, visual and auditory hallucinations, delusions, HTN, iron deficiency anemia, chronic back pain status post prior surgery, obesity, and GERD who presents for ***  Remote LHC in 2007 showed normal coronary arteries.  Lexiscan MPI in 2019 showed no evidence of ischemia or scar with an EF of 65% and was overall low risk.  Echo at that time demonstrated an EF of 55 to 60%, no regional wall motion abnormalities, grade 1 diastolic dysfunction, normal RV systolic function and PASP.  Most recent echo from 2021 showed an EF of 60 to 65%, no regional wall motion abnormalities, grade 1 diastolic dysfunction, normal RV systolic function and PASP.  Prior notes reported hallucination and concerns that someone was coming into her house taking items Monday.  She had an area to a Publishing rights manager.  She was admitted in 11/2021 with near syncope in the setting of rushing to the airport and having not eaten.  She was most recently seen in 02/2022 Zio patch from 02/2022 showed a predominant rhythm of sinus with an average rate of 99 bpm (range 70 to 134 bpm), and rare ventricular and atrial ectopy.  No significant arrhythmias, prolonged pauses, or evidence of high-grade AV block.  Patient triggered events were associated with sinus rhythm.  Lexiscan MPI from 03/2022 showed no evidence of ischemia or infarction with an EF greater than 65%.  CT attenuation corrected images that showed no significant coronary calcification or aortic atherosclerosis.  Overall, this was a low risk scan.  She contacted our office earlier this month with concerns that her blood pressure was too low.  Readings at home of 122/62  and 120/60.  She also reported increased anxiety over the preceding 4 months with a 50 pound weight loss.  She is scheduled to see her PCP next week the requested cardiology assess these complaints.  ***   Labs independently reviewed: 11/2022 - Hgb 14.9, PLT 297 10/2022 -potassium 3.7, BUN 12, serum creatinine 0.89, PMN 4.3, AST/ALT normal 07/2022 - A1c 5.9 11/2021 - Free T3 normal, TSH normal 02/2021 - direct LDL 143, TC 211, TG 266, HDL 57  Past Medical History:  Diagnosis Date   Anemia 2018   Anxiety    Arthritis    Borderline diabetes    Cataract    Chest pain    a. 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 [facility]   Depression    Dyspnea on exertion    a. 03/2009 Echo: EF 65%, no rwma, triv TR.   GERD (gastroesophageal reflux disease)    High cholesterol    Hypertension    Hypokalemia    Migraine headache    Obesity    Parkinson's disease    Pre-diabetes    okay now    Tachycardia     Past Surgical History:  Procedure Laterality Date   BLADDER SUSPENSION  2005   CARDIAC CATHETERIZATION     CESAREAN SECTION     CLOSED REDUCTION NASAL  FRACTURE N/A 01/11/2020   Procedure: CLOSED REDUCTION NASAL FRACTURE;  Surgeon: Linus Salmons, MD;  Location: [facility];  Service: ENT;  Laterality: N/A;   ENDOMETRIAL ABLATION  2007   FOOT SURGERY     bilateral bunions   LUMBAR LAMINECTOMY/DECOMPRESSION MICRODISCECTOMY Bilateral 01/12/2016   Procedure: Bilateral L4-5 Laminotomy/Foraminotomy;  Surgeon: Tressie Stalker, MD;  Location: [facility];  Service: Neurosurgery;  Laterality: Bilateral;  Bilateral L4-5 Laminotomy/Foraminotomy   TONSILLECTOMY  1971    Current Medications: No outpatient medications have been marked as taking for the 03/18/23 encounter (Appointment) with Sondra Barges, PA-C.    Allergies:    Chlorine, Dilaudid [hydromorphone], Codeine, Darvon, Hydrocodone, Meperidine, Oxycodone, Tramadol, and Victoza [liraglutide]   Social History   Socioeconomic History   Marital status: Married    Spouse name: Not on file   Number of children: 1   Years of education: Not on file   Highest education level: Not on file  Occupational History   Occupation: Magazine features editor: [employer]    Comment: retired-special needs teacher  Tobacco Use   Smoking status: Never   Smokeless tobacco: Never  Vaping Use   Vaping status: Never Used  Substance and Sexual Activity   Alcohol use: No   Drug use: No   Sexual activity: Yes    Partners: Male    Birth control/protection: Post-menopausal  Other Topics Concern   Not on file  Social History Narrative   Married.   One son is 72, lives in [place].   Retired from Agricultural consultant.   Enjoys substitute teaching, gardening, cooking.   Social Determinants of Health   Financial Resource Strain: Low Risk  (07/30/2022)   Overall Financial Resource Strain (CARDIA)    Difficulty of Paying Living Expenses: Not hard at all  Food Insecurity: No Food Insecurity (07/30/2022)   Hunger Vital Sign    Worried About Running Out of Food in the Last Year: Never true    Ran Out of Food in the Last Year: Never true  Transportation Needs: No Transportation Needs (07/30/2022)   PRAPARE - Administrator, Civil Service (Medical): No    Lack of Transportation (Non-Medical): No  Physical Activity: Unknown (07/30/2022)   Exercise Vital Sign    Days of Exercise per Week: 0 days    Minutes of Exercise per Session: Not on file  Stress: Stress Concern Present (07/30/2022)   [employer] of Occupational Health - Occupational Stress Questionnaire    Feeling of Stress : To some extent  Social Connections: Unknown (07/30/2022)   Social Connection and Isolation Panel [NHANES]    Frequency of Communication with Friends and Family: Not on file     Frequency of Social Gatherings with Friends and Family: Not on file    Attends Religious Services: Not on Marketing executive or Organizations: Not on file    Attends Banker Meetings: Not on file    Marital Status: Married     Family History:  The patient's family history includes Atrial fibrillation in her brother and mother; Breast cancer (age of onset: 8) in her sister; Cancer in her maternal grandfather and mother; Cancer (age of onset: 61) in her brother; Cirrhosis in her father; Diabetes in her brother, father, mother, and sister; Diabetes Mellitus II in her sister; Esophageal cancer in her brother; Heart disease in her brother, father, and mother; Heart failure in her brother and mother; Hypertension in her father  and mother; Lung cancer in her brother; Rashes / Skin problems in her father. There is no history of Colon cancer, Stomach cancer, or Rectal cancer.  ROS:   12-point review of systems is negative unless otherwise noted in the HPI.   EKGs/Labs/Other Studies Reviewed:    Studies reviewed were summarized above. The additional studies were reviewed today:  Lexiscan MPI 08/24/2017: Normal pharmacologic myocardial perfusion stress test without ischemia or scar. Small, hyperdynamic left ventricle with normal wall motion. LVEF >65%. This is a low risk study. ___________  2D echo 10/14/2017: - Left ventricle: The cavity size was normal. Systolic function was    normal. The estimated ejection fraction was in the range of 55%    to 60%. Wall motion was normal; there were no regional wall    motion abnormalities. Doppler parameters are consistent with    abnormal left ventricular relaxation (grade 1 diastolic    dysfunction).  - Left atrium: The atrium was normal in size.  - Right ventricle: Systolic function was normal.  - Pulmonary arteries: Systolic pressure was within the normal    range.  __________  2D echo 07/03/2020: 1. Left ventricular  ejection fraction, by estimation, is 60 to 65%. The  left ventricle has normal function. The left ventricle has no regional  wall motion abnormalities. Left ventricular diastolic parameters are  consistent with Grade I diastolic  dysfunction (impaired relaxation).   2. Right ventricular systolic function is normal. The right ventricular  size is normal. There is normal pulmonary artery systolic pressure. The  estimated right ventricular systolic pressure is 30.5 mmHg.  __________  Luci [employer] patch 02/2022: Patch Wear Time:  6 days and 8 hours (2023-09-25T11:35:56-0400 to 2023-10-01T20:14:56-0400) Only able to analyze 15 hours secondary to significant artifact   Normal sinus rhythm Patient had a min HR of 70 bpm, max HR of 134 bpm, and avg HR of 99 bpm.    Isolated SVEs were rare (<1.0%), SVE Couplets were rare (<1.0%), and SVE Triplets were rare (<1.0%).    Isolated VEs were rare (<1.0%), and no VE Couplets or VE Triplets were present.    Patient triggered events (2) associated with normal sinus rhythm __________  Lexiscan MPI 03/19/2022:   The study is normal. The study is low risk.   No ST deviation was noted.   LV perfusion is normal. There is no evidence of ischemia. There is no evidence of infarction.   Left ventricular function is normal. Nuclear stress EF: 80 %. The left ventricular ejection fraction is hyperdynamic (>65%). End diastolic cavity size is normal. End systolic cavity size is normal.   CT attenuation images with no significant aortic or coronary calcifications.  EKG:  EKG is ordered today.  The EKG ordered today demonstrates ***  Recent Labs: 10/20/2022: ALT 13; BUN 12; Creatinine, Ser 0.89; Potassium 3.7; Sodium 137 12/03/2022: Hemoglobin 14.9; Platelets 297  Recent Lipid Panel    Component Value Date/Time   CHOL 211 (H) 03/10/2021 0845   CHOL 123 06/22/2013 0809   TRIG 266.0 (H) 03/10/2021 0845   HDL 57.40 03/10/2021 0845   HDL 48 06/22/2013 0809   CHOLHDL 4  03/10/2021 0845   VLDL 53.2 (H) 03/10/2021 0845   LDLCALC 60 04/15/2020 1241   LDLCALC 60 06/22/2013 0809   LDLDIRECT 143.0 03/10/2021 0845    PHYSICAL EXAM:    VS:  There were no vitals taken for this visit.  BMI: There is no height or weight on file to calculate BMI.  Physical  Exam  Wt Readings from Last 3 Encounters:  02/08/23 163 lb (73.9 kg)  12/03/22 173 lb (78.5 kg)  10/05/22 180 lb 8 oz (81.9 kg)     ASSESSMENT & PLAN:   ***   {Are you ordering a CV Procedure (e.g. stress test, cath, DCCV, TEE, etc)?   Press F2        :[id number]}     Disposition: F/u with Dr. Mariah Milling or an APP in ***.   Medication Adjustments/Labs and Tests Ordered: Current medicines are reviewed at length with the patient today.  Concerns regarding medicines are outlined above. Medication changes, Labs and Tests ordered today are summarized above and listed in the Patient Instructions accessible in Encounters.   Signed, Eula Listen, PA-C 03/15/2023 10:56 AM     [facility] [place] [address] [place], [place] [address] [phone]

## 2023-03-16 DIAGNOSIS — L218 Other seborrheic dermatitis: Secondary | ICD-10-CM | POA: Diagnosis not present

## 2023-03-16 DIAGNOSIS — L718 Other rosacea: Secondary | ICD-10-CM | POA: Diagnosis not present

## 2023-03-18 ENCOUNTER — Ambulatory Visit: Payer: Medicare Other | Admitting: Physician Assistant

## 2023-03-22 ENCOUNTER — Encounter: Payer: Self-pay | Admitting: Primary Care

## 2023-03-22 ENCOUNTER — Ambulatory Visit (INDEPENDENT_AMBULATORY_CARE_PROVIDER_SITE_OTHER): Payer: Medicare Other | Admitting: Primary Care

## 2023-03-22 VITALS — BP 124/68 | HR 102 | Temp 97.9°F | Ht 62.0 in | Wt 154.0 lb

## 2023-03-22 DIAGNOSIS — D509 Iron deficiency anemia, unspecified: Secondary | ICD-10-CM | POA: Diagnosis not present

## 2023-03-22 DIAGNOSIS — R634 Abnormal weight loss: Secondary | ICD-10-CM | POA: Insufficient documentation

## 2023-03-22 DIAGNOSIS — F411 Generalized anxiety disorder: Secondary | ICD-10-CM

## 2023-03-22 DIAGNOSIS — E538 Deficiency of other specified B group vitamins: Secondary | ICD-10-CM | POA: Diagnosis not present

## 2023-03-22 DIAGNOSIS — R82998 Other abnormal findings in urine: Secondary | ICD-10-CM | POA: Insufficient documentation

## 2023-03-22 DIAGNOSIS — E559 Vitamin D deficiency, unspecified: Secondary | ICD-10-CM

## 2023-03-22 DIAGNOSIS — E611 Iron deficiency: Secondary | ICD-10-CM

## 2023-03-22 HISTORY — DX: Abnormal weight loss: R63.4

## 2023-03-22 HISTORY — DX: Other abnormal findings in urine: R82.998

## 2023-03-22 LAB — POC URINALSYSI DIPSTICK (AUTOMATED)
Bilirubin, UA: NEGATIVE
Blood, UA: NEGATIVE
Glucose, UA: NEGATIVE
Ketones, UA: NEGATIVE
Nitrite, UA: NEGATIVE
Protein, UA: POSITIVE — AB
Spec Grav, UA: 1.02 (ref 1.010–1.025)
Urobilinogen, UA: 0.2 U/dL
pH, UA: 5 (ref 5.0–8.0)

## 2023-03-22 NOTE — Assessment & Plan Note (Signed)
Due to lack of appetite.  Discussed to start protein drinks or Boost 1-2 times daily. Slowly advance diet.   Follow up with neurology as scheduled.

## 2023-03-22 NOTE — Assessment & Plan Note (Signed)
Repeat iron studies pending.
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
## 2023-03-22 NOTE — Assessment & Plan Note (Signed)
Spoke with patient's spouse who confirms that she never really took Buspar.  She agrees to try. Will start buspirone 5 mg BID x 1-2 weeks, increase to 7.5 mg thereafter if needed. She will update.  Continue Cymbalta 90 mg daily.  Follow up in 1 month

## 2023-03-22 NOTE — Patient Instructions (Signed)
Stop by the lab prior to leaving today. I will notify you of your results once received.   Start buspirone (Buspar) 5 mg twice daily for anxiety. Increase to 7.5 mg twice daily in 1-2 weeks if needed.  Set up a visit for 1 month.  It was a pleasure to see you today!

## 2023-03-22 NOTE — Assessment & Plan Note (Signed)
Repeat vitamin B12 pending. Not currently taking supplements.

## 2023-03-22 NOTE — Assessment & Plan Note (Signed)
UA today with 1+ leuks, negative nitrites and blood Culture pending.  Await results.  Increase water intake.

## 2023-03-22 NOTE — Assessment & Plan Note (Signed)
Resumed weekly vitamin D 50,000 international units one week ago. Repeat vitamin D level pending.

## 2023-03-23 ENCOUNTER — Ambulatory Visit: Payer: Medicare Other | Admitting: Clinical

## 2023-03-23 LAB — CBC
HCT: 47.6 % — ABNORMAL HIGH (ref 36.0–46.0)
Hemoglobin: 15.1 g/dL — ABNORMAL HIGH (ref 12.0–15.0)
MCHC: 31.7 g/dL (ref 30.0–36.0)
MCV: 95.3 fl (ref 78.0–100.0)
Platelets: 313 10*3/uL (ref 150.0–400.0)
RBC: 4.99 Mil/uL (ref 3.87–5.11)
RDW: 14.4 % (ref 11.5–15.5)
WBC: 9.4 10*3/uL (ref 4.0–10.5)

## 2023-03-23 LAB — BASIC METABOLIC PANEL
BUN: 14 mg/dL (ref 6–23)
CO2: 23 meq/L (ref 19–32)
Calcium: 9.9 mg/dL (ref 8.4–10.5)
Chloride: 110 meq/L (ref 96–112)
Creatinine, Ser: 0.8 mg/dL (ref 0.40–1.20)
GFR: 74.12 mL/min (ref >60.00)
Glucose, Bld: 94 mg/dL (ref 70–99)
Potassium: 4 meq/L (ref 3.5–5.1)
Sodium: 143 meq/L (ref 135–145)

## 2023-03-23 LAB — URINE CULTURE
MICRO NUMBER:: 15413311
SPECIMEN QUALITY:: ADEQUATE

## 2023-03-23 LAB — IBC + FERRITIN
Ferritin: 90.1 ng/mL (ref 10.0–291.0)
Iron: 117 ug/dL (ref 42–145)
Saturation Ratios: 35.9 % (ref 20.0–50.0)
TIBC: 326.2 ug/dL (ref 250.0–450.0)
Transferrin: 233 mg/dL (ref 212.0–360.0)

## 2023-03-23 LAB — VITAMIN D 25 HYDROXY (VIT D DEFICIENCY, FRACTURES): VITD: 45.22 ng/mL (ref 30.00–100.00)

## 2023-03-23 LAB — VITAMIN B12: Vitamin B-12: 188 pg/mL — ABNORMAL LOW (ref 211–911)

## 2023-03-25 ENCOUNTER — Telehealth: Payer: Self-pay | Admitting: Primary Care

## 2023-03-25 ENCOUNTER — Ambulatory Visit: Payer: Medicare Other | Admitting: Cardiovascular Disease

## 2023-03-25 NOTE — Telephone Encounter (Signed)
See result note for further documentation.

## 2023-03-25 NOTE — Telephone Encounter (Signed)
Patient called in returning call she received. Relayed message to patient to regarding busPIRone (BUSPAR) 5 MG tablet. She stated that she will continue to try it for a few more days.

## 2023-03-28 ENCOUNTER — Ambulatory Visit: Payer: Medicare Other | Admitting: Clinical

## 2023-03-29 ENCOUNTER — Ambulatory Visit: Payer: Medicare Other | Admitting: Clinical

## 2023-03-29 NOTE — Telephone Encounter (Signed)
Reviewed the patient's chart.  She saw her PCP on 03/22/23 and was started on Buspirone for GAD. She will follow up cardiology on 04/05/23.

## 2023-03-30 ENCOUNTER — Telehealth: Payer: Self-pay | Admitting: Primary Care

## 2023-03-30 DIAGNOSIS — F411 Generalized anxiety disorder: Secondary | ICD-10-CM

## 2023-03-30 DIAGNOSIS — F32A Depression, unspecified: Secondary | ICD-10-CM

## 2023-03-30 MED ORDER — MIRTAZAPINE 7.5 MG PO TABS: 7.5000 mg | ORAL_TABLET | Freq: Every day | ORAL | 0 refills | Status: DC

## 2023-03-30 NOTE — Telephone Encounter (Signed)
Pt's husband, Loraine Leriche, called to let Chestine Spore know that the meds, busPIRone (BUSPAR) 5 MG tablet is upsetting the pt's stomach, she's experiencing diarrhea, along with increasing her anxiety. Mark requested a call back to discuss taking pt off meds or substituting the meds for something else. Call back # [phone]

## 2023-03-30 NOTE — Telephone Encounter (Signed)
Please have her stop the buspirone.  Did they ever get an appointment for psychiatry? What about therapy?  Have her stop the buspirone.  We can try a medication called mirtazapine which is taken at bedtime.  This can help with panic disorder, depression, and improve her appetite.  Let me know if they want to try.

## 2023-03-30 NOTE — Telephone Encounter (Signed)
Noted.  Prescription sent to pharmacy. 

## 2023-03-30 NOTE — Addendum Note (Signed)
Addended by: Doreene Nest on: 03/30/2023 04:43 PM   Modules accepted: Orders

## 2023-03-30 NOTE — Telephone Encounter (Signed)
Pt called returning Kelli's call. Told pt Clark's response. Pt states she sees her physiatrist in October & pt states she wants to start taking mirtazapine. Pt states she'll discuss more with Clark during visit on tomorrow, 9/12. Call back # [phone]

## 2023-03-30 NOTE — Telephone Encounter (Signed)
Unable to reach patients spouse. Left voicemail to return call to our office.

## 2023-03-31 ENCOUNTER — Encounter: Payer: Self-pay | Admitting: Primary Care

## 2023-03-31 ENCOUNTER — Ambulatory Visit (INDEPENDENT_AMBULATORY_CARE_PROVIDER_SITE_OTHER): Payer: Medicare Other | Admitting: Primary Care

## 2023-03-31 VITALS — BP 124/78 | HR 70 | Temp 97.3°F | Ht 62.0 in | Wt 152.0 lb

## 2023-03-31 DIAGNOSIS — F411 Generalized anxiety disorder: Secondary | ICD-10-CM | POA: Diagnosis not present

## 2023-03-31 DIAGNOSIS — E538 Deficiency of other specified B group vitamins: Secondary | ICD-10-CM

## 2023-03-31 DIAGNOSIS — F331 Major depressive disorder, recurrent, moderate: Secondary | ICD-10-CM | POA: Diagnosis not present

## 2023-03-31 MED ORDER — CYANOCOBALAMIN 1000 MCG/ML IJ SOLN
1000.0000 ug | Freq: Once | INTRAMUSCULAR | Status: AC
Start: 2023-03-31 — End: 2023-03-31
  Administered 2023-03-31: 1000 ug via INTRAMUSCULAR

## 2023-03-31 NOTE — Patient Instructions (Signed)
Schedule nurse visits for your vitamin B12.  You had your first vitamin B12 shot today.  Schedule a vitamin B12 shot once weekly for the next 3 weeks.  Schedule a vitamin B12 shot every other week for 4 weeks.  Schedule a vitamin B12 shot once monthly for 1 month.  Schedule a lab only appointment for 4 months.  Start the mirtazapine medication for anxiety/depression/sleep/appetite.  Take this at bedtime.  Follow-up with psychiatry.   It was a pleasure to see you today!

## 2023-03-31 NOTE — Addendum Note (Signed)
Addended by: Lonia Blood on: 03/31/2023 03:59 PM   Modules accepted: Orders

## 2023-03-31 NOTE — Progress Notes (Signed)
Subjective:    Patient ID: Katherine [name], female    DOB: 1951/01/28, 72 y.o.   MRN: [id number]  HPI  Katherine [name] is a very pleasant 72 y.o. female with a history of chronic migraines, sleep apnea, frontotemporal dementia, chronic fatigue, recurrent cystitis, anxiety and depression, prediabetes, vitamin B12 deficiency who presents today to discuss anxiety and for vitamin B12 injection.  Her husband joins [place] today.  She was last evaluated on 03/22/2023 for follow-up of anxiety and depression.  During this visit she mentioned not taking BuSpar, was not sure why.  Anxiety remains uncontrolled.  Her buspirone was resumed at 5 mg twice daily.  Yesterday she notified our office that the buspirone was causing recurrent diarrhea so she was told to discontinue.  She was prescribed mirtazapine 7.5 mg at bedtime to help with depression, anxiety, appetite.  She was also noted to be deficient in vitamin B12 with a level of 188.  She is here for an injection today.  She has yet to pick up her prescription for mirtazapine. She has an appointment with psychiatry on 04/21/23.   Her diarrhea has improved.   Wt Readings from Last 3 Encounters:  03/31/23 152 lb (68.9 kg)  03/22/23 154 lb (69.9 kg)  02/08/23 163 lb (73.9 kg)      Review of Systems  Respiratory:  Negative for shortness of breath.   Cardiovascular:  Negative for chest pain.  Gastrointestinal:  Positive for diarrhea.  Psychiatric/Behavioral:  Positive for sleep disturbance. The patient is nervous/anxious.          Past Medical History:  Diagnosis Date   Anemia 2018   Anxiety    Arthritis    Borderline diabetes    Cataract    Chest pain    a. Normal coronaries by cath 2007; Anomalous RCA arising from LAD diagonal (versus total native RCA with collateral)   Chicken pox    age 79   Chronic back pain    Compressed discs. [facility]   Depression    Dyspnea on exertion    a. 03/2009 Echo: EF  65%, no rwma, triv TR.   GERD (gastroesophageal reflux disease)    High cholesterol    Hypertension    Hypokalemia    Migraine headache    Obesity    Parkinson's disease    Pre-diabetes    okay now    Tachycardia     Social History   Socioeconomic History   Marital status: Married    Spouse name: Not on file   Number of children: 1   Years of education: Not on file   Highest education level: Not on file  Occupational History   Occupation: Magazine features editor: [employer]    Comment: retired-special needs teacher  Tobacco Use   Smoking status: Never   Smokeless tobacco: Never  Vaping Use   Vaping status: Never Used  Substance and Sexual Activity   Alcohol use: No   Drug use: No   Sexual activity: Yes    Partners: Male    Birth control/protection: Post-menopausal  Other Topics Concern   Not on file  Social History Narrative   Married.   One son is 31, lives in [place].   Retired from Agricultural consultant.   Enjoys substitute teaching, gardening, cooking.   Social Determinants of Health   Financial Resource Strain: Low Risk  (07/30/2022)   Overall Financial Resource Strain (CARDIA)    Difficulty of Paying Living  Expenses: Not hard at all  Food Insecurity: No Food Insecurity (07/30/2022)   Hunger Vital Sign    Worried About Running Out of Food in the Last Year: Never true    Ran Out of Food in the Last Year: Never true  Transportation Needs: No Transportation Needs (07/30/2022)   PRAPARE - Administrator, Civil Service (Medical): No    Lack of Transportation (Non-Medical): No  Physical Activity: Unknown (07/30/2022)   Exercise Vital Sign    Days of Exercise per Week: 0 days    Minutes of Exercise per Session: Not on file  Stress: Stress Concern Present (07/30/2022)   [employer] of Occupational Health - Occupational Stress Questionnaire    Feeling of Stress : To some extent  Social Connections: Unknown (07/30/2022)   Social Connection and  Isolation Panel [NHANES]    Frequency of Communication with Friends and Family: Not on file    Frequency of Social Gatherings with Friends and Family: Not on file    Attends Religious Services: Not on file    Active Member of Clubs or Organizations: Not on file    Attends Banker Meetings: Not on file    Marital Status: Married  Intimate Partner Violence: Not At Risk (07/30/2022)   Humiliation, Afraid, Rape, and Kick questionnaire    Fear of Current or Ex-Partner: No    Emotionally Abused: No    Physically Abused: No    Sexually Abused: No    Past Surgical History:  Procedure Laterality Date   BLADDER SUSPENSION  2005   CARDIAC CATHETERIZATION     CESAREAN SECTION     CLOSED REDUCTION NASAL FRACTURE N/A 01/11/2020   Procedure: CLOSED REDUCTION NASAL FRACTURE;  Surgeon: Linus Salmons, MD;  Location: [facility];  Service: ENT;  Laterality: N/A;   ENDOMETRIAL ABLATION  2007   FOOT SURGERY     bilateral bunions   LUMBAR LAMINECTOMY/DECOMPRESSION MICRODISCECTOMY Bilateral 01/12/2016   Procedure: Bilateral L4-5 Laminotomy/Foraminotomy;  Surgeon: Tressie Stalker, MD;  Location: [facility];  Service: Neurosurgery;  Laterality: Bilateral;  Bilateral L4-5 Laminotomy/Foraminotomy   TONSILLECTOMY  1971    Family History  Problem Relation Age of Onset   Heart disease Mother    Hypertension Mother    Heart failure Mother        CHF   Diabetes Mother    Cancer Mother        CERVICAL CANCER   Atrial fibrillation Mother    Heart disease Father    Diabetes Father    Rashes / Skin problems Father    Hypertension Father    Cirrhosis Father        liver disease non alcoholic   Diabetes Sister    Breast cancer Sister 21   Diabetes Mellitus II Sister    Heart disease Brother    Heart failure Brother        CHF   Diabetes Brother    Cancer Brother 83       THROAT AND NECK   Esophageal cancer Brother    Lung cancer Brother    Atrial fibrillation Brother     Cancer Maternal Grandfather    Colon cancer Neg Hx    Stomach cancer Neg Hx    Rectal cancer Neg Hx     Allergies  Allergen Reactions   Chlorine Other (See Comments)    Smell causes lightheadedness.   Dilaudid [Hydromorphone]     hallucinations   Codeine Anxiety  Darvon Anxiety   Hydrocodone Anxiety   Meperidine Anxiety   Oxycodone Anxiety   Tramadol Anxiety   Victoza [Liraglutide] Nausea Only and Other (See Comments)    And weakness    Current Outpatient Medications on File Prior to Visit  Medication Sig Dispense Refill   DULoxetine (CYMBALTA) 30 MG capsule Take 30 mg by mouth daily.     DULoxetine (CYMBALTA) 60 MG capsule Take 60 mg by mouth every morning. Takes with her 30mg  capsule to equal 90 mg daily     famotidine (PEPCID) 20 MG tablet TAKE 1 TABLET (20 MG TOTAL) BY MOUTH DAILY. FOR HEARTBURN. 90 tablet 3   mirtazapine (REMERON) 7.5 MG tablet Take 1 tablet (7.5 mg total) by mouth at bedtime. for anxiety and depression. 90 tablet 0   Multiple Vitamin (MULTIVITAMIN) tablet Take 1 tablet by mouth daily.     NUPLAZID 34 MG CAPS Take 1 capsule by mouth daily.     pantoprazole (PROTONIX) 40 MG tablet TAKE 1 TABLET (40 MG TOTAL) BY MOUTH 2 (TWO) TIMES DAILY BEFORE A MEAL. FOR HEARTBURN. 180 tablet 1   propranolol (INDERAL) 20 MG tablet TAKE 1 TABLET (20 MG TOTAL) BY MOUTH 3 (THREE) TIMES DAILY AS NEEDED. FOR HEARTRATE >110 BPM 90 tablet 2   spironolactone (ALDACTONE) 25 MG tablet Take 1 tablet (25 mg total) by mouth daily. PLEASE SCHEDULE APPOINTMENT FOR FURTHER REFILLS. THANK YOU! 90 tablet 0   SUMAtriptan (IMITREX) 100 MG tablet Take 1 tablet by mouth at migraine onset. Do not exceed 100 mg in 24 hours. 10 tablet 0   tamsulosin (FLOMAX) 0.4 MG CAPS capsule Take 0.4 mg by mouth daily.     topiramate (TOPAMAX) 50 MG tablet Take 1 tablet by mouth every morning and 2 tablets at night for migraine prevention. 270 tablet 1   Vitamin D, Ergocalciferol, (DRISDOL) 1.25 MG (50000 UNIT)  CAPS capsule TAKE 1 CAPSULE BY MOUTH ONCE WEEKLY FOR 12 WEEKS. 12 capsule 0   amLODipine (NORVASC) 5 MG tablet Take 1 tablet (5 mg total) by mouth daily. (Patient not taking: Reported on 12/03/2022) 90 tablet 3   gabapentin (NEURONTIN) 300 MG capsule TAKE 1 CAPSULE (300 MG TOTAL) BY MOUTH AT BEDTIME. FOR BACK PAIN. (Patient not taking: Reported on 03/31/2023) 90 capsule 1   levocetirizine (XYZAL) 5 MG tablet Take 1 tablet (5 mg total) by mouth at bedtime as needed for allergies. (Patient not taking: Reported on 02/08/2023) 90 tablet 3   OVER THE COUNTER MEDICATION Olly Beauty vitamin (Patient not taking: Reported on 03/31/2023)     OVER THE COUNTER MEDICATION 2 tablets daily. Air Shield (Patient not taking: Reported on 03/22/2023)     No current facility-administered medications on file prior to visit.    BP 124/78   Pulse 70   Temp (!) 97.3 F (36.3 C) (Temporal)   Ht 5\' 2"  (1.575 m)   Wt 152 lb (68.9 kg)   SpO2 98%   BMI 27.80 kg/m  Objective:   Physical Exam Cardiovascular:     Rate and Rhythm: Normal rate and regular rhythm.  Pulmonary:     Effort: Pulmonary effort is normal.     Breath sounds: Normal breath sounds.  Musculoskeletal:     Cervical back: Neck supple.  Skin:    General: Skin is warm and dry.           Assessment & Plan:  GAD (generalized anxiety disorder) Assessment & Plan: Intolerant to Buspar. Remain off.  Start mirtazapine 7.5  mg HS for depression, anxiety, and appetite. She will follow up with psychiatry as scheduled.      Moderate episode of recurrent major depressive disorder [facility]) Assessment & Plan: Intolerant to Buspar. Remain off.  Start mirtazapine 7.5 mg HS for depression, anxiety, and appetite. She will follow up with psychiatry as scheduled.    Vitamin B 12 deficiency Assessment & Plan: Start vitamin B12 1000 injections.  First injection provided today.  Inject 1000 mcg once weekly x 4 weeks, then 1000 mcg every other week x 4  weeks, then once monthly thereafter. Repeat vitamin B12 level in 4 months.         Doreene Nest, NP

## 2023-03-31 NOTE — Assessment & Plan Note (Signed)
Start vitamin B12 1000 injections.  First injection provided today.  Inject 1000 mcg once weekly x 4 weeks, then 1000 mcg every other week x 4 weeks, then once monthly thereafter. Repeat vitamin B12 level in 4 months.

## 2023-03-31 NOTE — Assessment & Plan Note (Signed)
Intolerant to Buspar. Remain off.  Start mirtazapine 7.5 mg HS for depression, anxiety, and appetite. She will follow up with psychiatry as scheduled.

## 2023-04-02 ENCOUNTER — Other Ambulatory Visit: Payer: Self-pay | Admitting: Cardiovascular Disease

## 2023-04-02 DIAGNOSIS — I10 Essential (primary) hypertension: Secondary | ICD-10-CM

## 2023-04-02 DIAGNOSIS — R079 Chest pain, unspecified: Secondary | ICD-10-CM

## 2023-04-02 NOTE — Progress Notes (Deleted)
Cardiology Office Note    Date:  04/02/2023   ID:  Katherine, [name] [birth year], MRN [id number]  PCP:  Doreene Nest, NP  Cardiologist:  Julien Nordmann, MD  Electrophysiologist:  None   Chief Complaint: ***  History of Present Illness:   Katherine [name] is a 72 y.o. female with history of Lewy body dementia, visual and auditory hallucinations, delusions, HTN, iron deficiency anemia, chronic back pain status post prior surgery, obesity, and GERD who presents for ***  Remote LHC in 2007 showed normal coronary arteries.  Lexiscan MPI in 2019 showed no evidence of ischemia or scar with an EF of 65% and was overall low risk.  Echo at that time demonstrated an EF of 55 to 60%, no regional wall motion abnormalities, grade 1 diastolic dysfunction, normal RV systolic function and PASP.  Most recent echo from 2021 showed an EF of 60 to 65%, no regional wall motion abnormalities, grade 1 diastolic dysfunction, normal RV systolic function and PASP.  Prior notes reported hallucination and concerns that someone was coming into her house taking items Monday.  She had an area to a Publishing rights manager.  She was admitted in 11/2021 with near syncope in the setting of rushing to the airport and having not eaten.  She was most recently seen in 02/2022 Zio patch from 02/2022 showed a predominant rhythm of sinus with an average rate of 99 bpm (range 70 to 134 bpm), and rare ventricular and atrial ectopy.  No significant arrhythmias, prolonged pauses, or evidence of high-grade AV block.  Patient triggered events were associated with sinus rhythm.  Lexiscan MPI from 03/2022 showed no evidence of ischemia or infarction with an EF greater than 65%.  CT attenuation corrected images that showed no significant coronary calcification or aortic atherosclerosis.  Overall, this was a low risk scan.  She contacted our office earlier this month with concerns that her blood pressure was too low.  Readings at home of 122/62  and 120/60.  She also reported increased anxiety over the preceding 4 months with a 50 pound weight loss.  She is scheduled to see her PCP next week the requested cardiology assess these complaints.  ***   Labs independently reviewed: 11/2022 - Hgb 14.9, PLT 297 10/2022 -potassium 3.7, BUN 12, serum creatinine 0.89, PMN 4.3, AST/ALT normal 07/2022 - A1c 5.9 11/2021 - Free T3 normal, TSH normal 02/2021 - direct LDL 143, TC 211, TG 266, HDL 57  Past Medical History:  Diagnosis Date   Anemia 2018   Anxiety    Arthritis    Borderline diabetes    Cataract    Chest pain    a. Normal coronaries by cath 2007; Anomalous RCA arising from LAD diagonal (versus total native RCA with collateral)   Chicken pox    age 13   Chronic back pain    Compressed discs. [facility]   Depression    Dyspnea on exertion    a. 03/2009 Echo: EF 65%, no rwma, triv TR.   GERD (gastroesophageal reflux disease)    High cholesterol    Hypertension    Hypokalemia    Migraine headache    Obesity    Parkinson's disease    Pre-diabetes    okay now    Tachycardia     Past Surgical History:  Procedure Laterality Date   BLADDER SUSPENSION  2005   CARDIAC CATHETERIZATION     CESAREAN SECTION     CLOSED REDUCTION NASAL  FRACTURE N/A 01/11/2020   Procedure: CLOSED REDUCTION NASAL FRACTURE;  Surgeon: Linus Salmons, MD;  Location: [facility];  Service: ENT;  Laterality: N/A;   ENDOMETRIAL ABLATION  2007   FOOT SURGERY     bilateral bunions   LUMBAR LAMINECTOMY/DECOMPRESSION MICRODISCECTOMY Bilateral 01/12/2016   Procedure: Bilateral L4-5 Laminotomy/Foraminotomy;  Surgeon: Tressie Stalker, MD;  Location: [facility];  Service: Neurosurgery;  Laterality: Bilateral;  Bilateral L4-5 Laminotomy/Foraminotomy   TONSILLECTOMY  1971    Current Medications: No outpatient medications have been marked as taking for the 04/05/23 encounter (Appointment) with Sondra Barges, PA-C.    Allergies:    Chlorine, Dilaudid [hydromorphone], Codeine, Darvon, Hydrocodone, Meperidine, Oxycodone, Tramadol, and Victoza [liraglutide]   Social History   Socioeconomic History   Marital status: Married    Spouse name: Not on file   Number of children: 1   Years of education: Not on file   Highest education level: Not on file  Occupational History   Occupation: Magazine features editor: [employer]    Comment: retired-special needs teacher  Tobacco Use   Smoking status: Never   Smokeless tobacco: Never  Vaping Use   Vaping status: Never Used  Substance and Sexual Activity   Alcohol use: No   Drug use: No   Sexual activity: Yes    Partners: Male    Birth control/protection: Post-menopausal  Other Topics Concern   Not on file  Social History Narrative   Married.   One son is 64, lives in [place].   Retired from Agricultural consultant.   Enjoys substitute teaching, gardening, cooking.   Social Determinants of Health   Financial Resource Strain: Low Risk  (07/30/2022)   Overall Financial Resource Strain (CARDIA)    Difficulty of Paying Living Expenses: Not hard at all  Food Insecurity: No Food Insecurity (07/30/2022)   Hunger Vital Sign    Worried About Running Out of Food in the Last Year: Never true    Ran Out of Food in the Last Year: Never true  Transportation Needs: No Transportation Needs (07/30/2022)   PRAPARE - Administrator, Civil Service (Medical): No    Lack of Transportation (Non-Medical): No  Physical Activity: Unknown (07/30/2022)   Exercise Vital Sign    Days of Exercise per Week: 0 days    Minutes of Exercise per Session: Not on file  Stress: Stress Concern Present (07/30/2022)   [employer] of Occupational Health - Occupational Stress Questionnaire    Feeling of Stress : To some extent  Social Connections: Unknown (07/30/2022)   Social Connection and Isolation Panel [NHANES]    Frequency of Communication with Friends and Family: Not on file     Frequency of Social Gatherings with Friends and Family: Not on file    Attends Religious Services: Not on Marketing executive or Organizations: Not on file    Attends Banker Meetings: Not on file    Marital Status: Married     Family History:  The patient's family history includes Atrial fibrillation in her brother and mother; Breast cancer (age of onset: 39) in her sister; Cancer in her maternal grandfather and mother; Cancer (age of onset: 19) in her brother; Cirrhosis in her father; Diabetes in her brother, father, mother, and sister; Diabetes Mellitus II in her sister; Esophageal cancer in her brother; Heart disease in her brother, father, and mother; Heart failure in her brother and mother; Hypertension in her father  and mother; Lung cancer in her brother; Rashes / Skin problems in her father. There is no history of Colon cancer, Stomach cancer, or Rectal cancer.  ROS:   12-point review of systems is negative unless otherwise noted in the HPI.   EKGs/Labs/Other Studies Reviewed:    Studies reviewed were summarized above. The additional studies were reviewed today:  Lexiscan MPI 08/24/2017: Normal pharmacologic myocardial perfusion stress test without ischemia or scar. Small, hyperdynamic left ventricle with normal wall motion. LVEF >65%. This is a low risk study. ___________  2D echo 10/14/2017: - Left ventricle: The cavity size was normal. Systolic function was    normal. The estimated ejection fraction was in the range of 55%    to 60%. Wall motion was normal; there were no regional wall    motion abnormalities. Doppler parameters are consistent with    abnormal left ventricular relaxation (grade 1 diastolic    dysfunction).  - Left atrium: The atrium was normal in size.  - Right ventricle: Systolic function was normal.  - Pulmonary arteries: Systolic pressure was within the normal    range.  __________  2D echo 07/03/2020: 1. Left ventricular  ejection fraction, by estimation, is 60 to 65%. The  left ventricle has normal function. The left ventricle has no regional  wall motion abnormalities. Left ventricular diastolic parameters are  consistent with Grade I diastolic  dysfunction (impaired relaxation).   2. Right ventricular systolic function is normal. The right ventricular  size is normal. There is normal pulmonary artery systolic pressure. The  estimated right ventricular systolic pressure is 30.5 mmHg.  __________  Luci Bank patch 02/2022: Patch Wear Time:  6 days and 8 hours (2023-09-25T11:35:56-0400 to 2023-10-01T20:14:56-0400) Only able to analyze 15 hours secondary to significant artifact   Normal sinus rhythm Patient had a min HR of 70 bpm, max HR of 134 bpm, and avg HR of 99 bpm.    Isolated SVEs were rare (<1.0%), SVE Couplets were rare (<1.0%), and SVE Triplets were rare (<1.0%).    Isolated VEs were rare (<1.0%), and no VE Couplets or VE Triplets were present.    Patient triggered events (2) associated with normal sinus rhythm __________  Lexiscan MPI 03/19/2022:   The study is normal. The study is low risk.   No ST deviation was noted.   LV perfusion is normal. There is no evidence of ischemia. There is no evidence of infarction.   Left ventricular function is normal. Nuclear stress EF: 80 %. The left ventricular ejection fraction is hyperdynamic (>65%). End diastolic cavity size is normal. End systolic cavity size is normal.   CT attenuation images with no significant aortic or coronary calcifications.  EKG:  EKG is ordered today.  The EKG ordered today demonstrates ***  Recent Labs: 10/20/2022: ALT 13 03/22/2023: BUN 14; Creatinine, Ser 0.80; Hemoglobin 15.1; Platelets 313.0; Potassium 4.0; Sodium 143  Recent Lipid Panel    Component Value Date/Time   CHOL 211 (H) 03/10/2021 0845   CHOL 123 06/22/2013 0809   TRIG 266.0 (H) 03/10/2021 0845   HDL 57.40 03/10/2021 0845   HDL 48 06/22/2013 0809   CHOLHDL 4  03/10/2021 0845   VLDL 53.2 (H) 03/10/2021 0845   LDLCALC 60 04/15/2020 1241   LDLCALC 60 06/22/2013 0809   LDLDIRECT 143.0 03/10/2021 0845    PHYSICAL EXAM:    VS:  There were no vitals taken for this visit.  BMI: There is no height or weight on file to calculate BMI.  Physical  Exam  Wt Readings from Last 3 Encounters:  03/31/23 152 lb (68.9 kg)  03/22/23 154 lb (69.9 kg)  02/08/23 163 lb (73.9 kg)     ASSESSMENT & PLAN:   ***   {Are you ordering a CV Procedure (e.g. stress test, cath, DCCV, TEE, etc)?   Press F2        :[id number]}     Disposition: F/u with Dr. Mariah Milling or an APP in ***.   Medication Adjustments/Labs and Tests Ordered: Current medicines are reviewed at length with the patient today.  Concerns regarding medicines are outlined above. Medication changes, Labs and Tests ordered today are summarized above and listed in the Patient Instructions accessible in Encounters.   Signed, Eula Listen, PA-C 04/02/2023 12:03 PM     [facility] [place] [address] [place], [place] [address] [phone]

## 2023-04-04 ENCOUNTER — Ambulatory Visit: Payer: Medicare Other | Admitting: Primary Care

## 2023-04-05 ENCOUNTER — Ambulatory Visit: Payer: Medicare Other | Admitting: Physician Assistant

## 2023-04-05 NOTE — Progress Notes (Deleted)
Cardiology Office Note    Date:  04/05/2023   ID:  Katherine [name], Katherine [name] October 02, 1950, MRN [id number]  PCP:  Doreene Nest, NP  Cardiologist:  Julien Nordmann, MD  Electrophysiologist:  None   Chief Complaint: ***  History of Present Illness:   Katherine [name] is a 72 y.o. female with history of Lewy body dementia, visual and auditory hallucinations, delusions, HTN, iron deficiency anemia, chronic back pain status post prior surgery, obesity, and GERD who presents for ***  Remote LHC in 2007 showed normal coronary arteries.  Lexiscan MPI in 2019 showed no evidence of ischemia or scar with an EF of 65% and was overall low risk.  Echo at that time demonstrated an EF of 55 to 60%, no regional wall motion abnormalities, grade 1 diastolic dysfunction, normal RV systolic function and PASP.  Most recent echo from 2021 showed an EF of 60 to 65%, no regional wall motion abnormalities, grade 1 diastolic dysfunction, normal RV systolic function and PASP.  Prior notes reported hallucination and concerns that someone was coming into her house taking items Monday.  She had an area to a Publishing rights manager.  She was admitted in 11/2021 with near syncope in the setting of rushing to the airport and having not eaten.  She was most recently seen in 02/2022 Zio patch from 02/2022 showed a predominant rhythm of sinus with an average rate of 99 bpm (range 70 to 134 bpm), and rare ventricular and atrial ectopy.  No significant arrhythmias, prolonged pauses, or evidence of high-grade AV block.  Patient triggered events were associated with sinus rhythm.  Lexiscan MPI from 03/2022 showed no evidence of ischemia or infarction with an EF greater than 65%.  CT attenuation corrected images that showed no significant coronary calcification or aortic atherosclerosis.  Overall, this was a low risk scan.  She contacted our office earlier this month with concerns that her blood pressure was too low.  Readings at home of 122/62  and 120/60.  She also reported increased anxiety over the preceding 4 months with a 50 pound weight loss.  She is scheduled to see her PCP next week the requested cardiology assess these complaints.  ***   Labs independently reviewed: 11/2022 - Hgb 14.9, PLT 297 10/2022 -potassium 3.7, BUN 12, serum creatinine 0.89, PMN 4.3, AST/ALT normal 07/2022 - A1c 5.9 11/2021 - Free T3 normal, TSH normal 02/2021 - direct LDL 143, TC 211, TG 266, HDL 57  Past Medical History:  Diagnosis Date   Anemia 2018   Anxiety    Arthritis    Borderline diabetes    Cataract    Chest pain    a. 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 [facility]   Depression    Dyspnea on exertion    a. 03/2009 Echo: EF 65%, no rwma, triv TR.   GERD (gastroesophageal reflux disease)    High cholesterol    Hypertension    Hypokalemia    Migraine headache    Obesity    Parkinson's disease    Pre-diabetes    okay now    Tachycardia     Past Surgical History:  Procedure Laterality Date   BLADDER SUSPENSION  2005   CARDIAC CATHETERIZATION     CESAREAN SECTION     CLOSED REDUCTION NASAL  FRACTURE N/A 01/11/2020   Procedure: CLOSED REDUCTION NASAL FRACTURE;  Surgeon: Linus Salmons, MD;  Location: [facility];  Service: ENT;  Laterality: N/A;   ENDOMETRIAL ABLATION  2007   FOOT SURGERY     bilateral bunions   LUMBAR LAMINECTOMY/DECOMPRESSION MICRODISCECTOMY Bilateral 01/12/2016   Procedure: Bilateral L4-5 Laminotomy/Foraminotomy;  Surgeon: Tressie Stalker, MD;  Location: [facility];  Service: Neurosurgery;  Laterality: Bilateral;  Bilateral L4-5 Laminotomy/Foraminotomy   TONSILLECTOMY  1971    Current Medications: No outpatient medications have been marked as taking for the 04/07/23 encounter (Appointment) with Sondra Barges, PA-C.    Allergies:    Chlorine, Dilaudid [hydromorphone], Codeine, Darvon, Hydrocodone, Meperidine, Oxycodone, Tramadol, and Victoza [liraglutide]   Social History   Socioeconomic History   Marital status: Married    Spouse name: Not on file   Number of children: 1   Years of education: Not on file   Highest education level: Not on file  Occupational History   Occupation: Magazine features editor: [employer]    Comment: retired-special needs teacher  Tobacco Use   Smoking status: Never   Smokeless tobacco: Never  Vaping Use   Vaping status: Never Used  Substance and Sexual Activity   Alcohol use: No   Drug use: No   Sexual activity: Yes    Partners: Male    Birth control/protection: Post-menopausal  Other Topics Concern   Not on file  Social History Narrative   Married.   One son is 33, lives in [place].   Retired from Agricultural consultant.   Enjoys substitute teaching, gardening, cooking.   Social Determinants of Health   Financial Resource Strain: Low Risk  (07/30/2022)   Overall Financial Resource Strain (CARDIA)    Difficulty of Paying Living Expenses: Not hard at all  Food Insecurity: No Food Insecurity (07/30/2022)   Hunger Vital Sign    Worried About Running Out of Food in the Last Year: Never true    Ran Out of Food in the Last Year: Never true  Transportation Needs: No Transportation Needs (07/30/2022)   PRAPARE - Administrator, Civil Service (Medical): No    Lack of Transportation (Non-Medical): No  Physical Activity: Unknown (07/30/2022)   Exercise Vital Sign    Days of Exercise per Week: 0 days    Minutes of Exercise per Session: Not on file  Stress: Stress Concern Present (07/30/2022)   [employer] of Occupational Health - Occupational Stress Questionnaire    Feeling of Stress : To some extent  Social Connections: Unknown (07/30/2022)   Social Connection and Isolation Panel [NHANES]    Frequency of Communication with Friends and Family: Not on file     Frequency of Social Gatherings with Friends and Family: Not on file    Attends Religious Services: Not on Marketing executive or Organizations: Not on file    Attends Banker Meetings: Not on file    Marital Status: Married     Family History:  The patient's family history includes Atrial fibrillation in her brother and mother; Breast cancer (age of onset: 81) in her sister; Cancer in her maternal grandfather and mother; Cancer (age of onset: 23) in her brother; Cirrhosis in her father; Diabetes in her brother, father, mother, and sister; Diabetes Mellitus II in her sister; Esophageal cancer in her brother; Heart disease in her brother, father, and mother; Heart failure in her brother and mother; Hypertension in her father  and mother; Lung cancer in her brother; Rashes / Skin problems in her father. There is no history of Colon cancer, Stomach cancer, or Rectal cancer.  ROS:   12-point review of systems is negative unless otherwise noted in the HPI.   EKGs/Labs/Other Studies Reviewed:    Studies reviewed were summarized above. The additional studies were reviewed today:  Lexiscan MPI 08/24/2017: Normal pharmacologic myocardial perfusion stress test without ischemia or scar. Small, hyperdynamic left ventricle with normal wall motion. LVEF >65%. This is a low risk study. ___________  2D echo 10/14/2017: - Left ventricle: The cavity size was normal. Systolic function was    normal. The estimated ejection fraction was in the range of 55%    to 60%. Wall motion was normal; there were no regional wall    motion abnormalities. Doppler parameters are consistent with    abnormal left ventricular relaxation (grade 1 diastolic    dysfunction).  - Left atrium: The atrium was normal in size.  - Right ventricle: Systolic function was normal.  - Pulmonary arteries: Systolic pressure was within the normal    range.  __________  2D echo 07/03/2020: 1. Left ventricular  ejection fraction, by estimation, is 60 to 65%. The  left ventricle has normal function. The left ventricle has no regional  wall motion abnormalities. Left ventricular diastolic parameters are  consistent with Grade I diastolic  dysfunction (impaired relaxation).   2. Right ventricular systolic function is normal. The right ventricular  size is normal. There is normal pulmonary artery systolic pressure. The  estimated right ventricular systolic pressure is 30.5 mmHg.  __________  Luci Bank patch 02/2022: Patch Wear Time:  6 days and 8 hours (2023-09-25T11:35:56-0400 to 2023-10-01T20:14:56-0400) Only able to analyze 15 hours secondary to significant artifact   Normal sinus rhythm Patient had a min HR of 70 bpm, max HR of 134 bpm, and avg HR of 99 bpm.    Isolated SVEs were rare (<1.0%), SVE Couplets were rare (<1.0%), and SVE Triplets were rare (<1.0%).    Isolated VEs were rare (<1.0%), and no VE Couplets or VE Triplets were present.    Patient triggered events (2) associated with normal sinus rhythm __________  Lexiscan MPI 03/19/2022:   The study is normal. The study is low risk.   No ST deviation was noted.   LV perfusion is normal. There is no evidence of ischemia. There is no evidence of infarction.   Left ventricular function is normal. Nuclear stress EF: 80 %. The left ventricular ejection fraction is hyperdynamic (>65%). End diastolic cavity size is normal. End systolic cavity size is normal.   CT attenuation images with no significant aortic or coronary calcifications.  EKG:  EKG is ordered today.  The EKG ordered today demonstrates ***  Recent Labs: 10/20/2022: ALT 13 03/22/2023: BUN 14; Creatinine, Ser 0.80; Hemoglobin 15.1; Platelets 313.0; Potassium 4.0; Sodium 143  Recent Lipid Panel    Component Value Date/Time   CHOL 211 (H) 03/10/2021 0845   CHOL 123 06/22/2013 0809   TRIG 266.0 (H) 03/10/2021 0845   HDL 57.40 03/10/2021 0845   HDL 48 06/22/2013 0809   CHOLHDL 4  03/10/2021 0845   VLDL 53.2 (H) 03/10/2021 0845   LDLCALC 60 04/15/2020 1241   LDLCALC 60 06/22/2013 0809   LDLDIRECT 143.0 03/10/2021 0845    PHYSICAL EXAM:    VS:  There were no vitals taken for this visit.  BMI: There is no height or weight on file to calculate BMI.  Physical  Exam  Wt Readings from Last 3 Encounters:  03/31/23 152 lb (68.9 kg)  03/22/23 154 lb (69.9 kg)  02/08/23 163 lb (73.9 kg)     ASSESSMENT & PLAN:   ***   {Are you ordering a CV Procedure (e.g. stress test, cath, DCCV, TEE, etc)?   Press F2        :[id number]}     Disposition: F/u with Dr. Mariah Milling or an APP in ***.   Medication Adjustments/Labs and Tests Ordered: Current medicines are reviewed at length with the patient today.  Concerns regarding medicines are outlined above. Medication changes, Labs and Tests ordered today are summarized above and listed in the Patient Instructions accessible in Encounters.   Signed, Eula Listen, PA-C 04/05/2023 2:32 PM     [facility] [place] [address] [place], [place] [address] [phone]

## 2023-04-06 ENCOUNTER — Ambulatory Visit: Payer: Medicare Other

## 2023-04-07 ENCOUNTER — Ambulatory Visit: Payer: Medicare Other | Attending: Physician Assistant | Admitting: Physician Assistant

## 2023-04-09 ENCOUNTER — Other Ambulatory Visit: Payer: Self-pay | Admitting: Primary Care

## 2023-04-11 ENCOUNTER — Telehealth: Payer: Self-pay | Admitting: Primary Care

## 2023-04-11 NOTE — Telephone Encounter (Signed)
Noted  

## 2023-04-11 NOTE — Telephone Encounter (Signed)
FYI: This call has been transferred to Access Nurse. Once the result note has been entered staff can address the message at that time.  Patient called in with the following symptoms:  Red Word:chest pain   Please advise at [facility] [phone]  Message is routed to Provider Pool and [facility] Triage   Pt's husband, Loraine Leriche, called stating the pt is experiencing chest pain on the left side. No other symptoms. Scheduled pt with Dr. Ermalene Searing for tomorrow, 9/24, per pt's request. Loraine Leriche states pt is declining triage.

## 2023-04-12 ENCOUNTER — Ambulatory Visit (INDEPENDENT_AMBULATORY_CARE_PROVIDER_SITE_OTHER): Payer: Medicare Other | Admitting: Family Medicine

## 2023-04-12 ENCOUNTER — Encounter: Payer: Self-pay | Admitting: Family Medicine

## 2023-04-12 VITALS — BP 100/60 | HR 73 | Temp 98.8°F | Ht 62.0 in | Wt 151.1 lb

## 2023-04-12 DIAGNOSIS — F5104 Psychophysiologic insomnia: Secondary | ICD-10-CM | POA: Diagnosis not present

## 2023-04-12 DIAGNOSIS — R630 Anorexia: Secondary | ICD-10-CM

## 2023-04-12 DIAGNOSIS — F411 Generalized anxiety disorder: Secondary | ICD-10-CM | POA: Diagnosis not present

## 2023-04-12 DIAGNOSIS — R634 Abnormal weight loss: Secondary | ICD-10-CM

## 2023-04-12 DIAGNOSIS — E538 Deficiency of other specified B group vitamins: Secondary | ICD-10-CM

## 2023-04-12 DIAGNOSIS — R0789 Other chest pain: Secondary | ICD-10-CM

## 2023-04-12 MED ORDER — CYANOCOBALAMIN 1000 MCG/ML IJ SOLN
1000.0000 ug | Freq: Once | INTRAMUSCULAR | Status: AC
Start: 2023-04-12 — End: 2023-04-12
  Administered 2023-04-12: 1000 ug via INTRAMUSCULAR

## 2023-04-12 NOTE — Progress Notes (Signed)
Patient ID: Katherine [name], female    DOB: February 14, 1951, 72 y.o.   MRN: [id number]  This visit was conducted in person.  BP 100/60 (BP Location: Left Arm, Patient Position: Sitting, Cuff Size: Normal)   Pulse 73   Temp 98.8 F (37.1 C) (Temporal)   Ht 5\' 2"  (1.575 m)   Wt 151 lb 2 oz (68.5 kg)   SpO2 98%   BMI 27.64 kg/m    CC:  Chief Complaint  Patient presents with   Weight Loss   Decreased Appetite   Anxiety    Has appointment with new psychiatrist 10/3   Insomnia   Chest Pain    Has appointment with Cardiology on Thursday    Subjective:   HPI: Katherine [name] is a 72 y.o. female patient of Graylon Gunning with history of high blood pressure, prediabetes, chronic fatigue gastroesophageal reflux disease, Parkinson's disease, Lewy body dementia/frontotemporal dementia presenting on 04/12/2023 for Weight Loss, Decreased Appetite, Anxiety (Has appointment with new psychiatrist 10/3), Insomnia, and Chest Pain (Has appointment with Cardiology on Thursday)  Reviewed recent PCP note from March 31, 2023 Follow-up anxiety and depression, weight loss and decreased appetite. Has appointment with psychiatry April 21, 2023. Encouraged her to start the mirtazapine for mood and appetite. Weight  fairly stable in the last 2 weeks Wt Readings from Last 3 Encounters:  04/12/23 151 lb 2 oz (68.5 kg)  03/31/23 152 lb (68.9 kg)  03/22/23 154 lb (69.9 kg)   Today she presents with husband She reports  new onset chest pain, intermittent , mainly occurring at night  lying down.  Sharp, pain at rest, left  upper chest.  Associated with trouble taking deep breath, no sweating, no referral of pain up neck and down arm.  Hx of GERD, no burping. On protonix daily.   Has not yet started mirtazapine... she was worried about bipolar disorder.  Has appt with cardiology  Dr. Mariah Milling next week for   tachycardia HTN follow up  Reviewed last OV from 04/12/2022  BP Readings from Last 3  Encounters:  04/12/23 100/60  03/31/23 124/78  03/22/23 124/68          Relevant past medical, surgical, family and social history reviewed and updated as indicated. Interim medical history since our last visit reviewed. Allergies and medications reviewed and updated. Outpatient Medications Prior to Visit  Medication Sig Dispense Refill   DULoxetine (CYMBALTA) 30 MG capsule Take 30 mg by mouth daily.     DULoxetine (CYMBALTA) 60 MG capsule Take 60 mg by mouth every morning. Takes with her 30mg  capsule to equal 90 mg daily     famotidine (PEPCID) 20 MG tablet TAKE 1 TABLET (20 MG TOTAL) BY MOUTH DAILY. FOR HEARTBURN. 90 tablet 3   gabapentin (NEURONTIN) 300 MG capsule TAKE 1 CAPSULE (300 MG TOTAL) BY MOUTH AT BEDTIME. FOR BACK PAIN. 90 capsule 1   Multiple Vitamin (MULTIVITAMIN) tablet Take 1 tablet by mouth daily.     NUPLAZID 34 MG CAPS Take 1 capsule by mouth daily.     OVER THE COUNTER MEDICATION [employer] vitamin     OVER THE COUNTER MEDICATION 2 tablets daily. Air Shield     pantoprazole (PROTONIX) 40 MG tablet TAKE 1 TABLET (40 MG TOTAL) BY MOUTH 2 (TWO) TIMES DAILY BEFORE A MEAL. FOR HEARTBURN. 180 tablet 1   propranolol (INDERAL) 20 MG tablet TAKE 1 TABLET (20 MG TOTAL) BY MOUTH 3 (THREE) TIMES DAILY AS NEEDED. FOR HEARTRATE >  110 BPM 90 tablet 2   spironolactone (ALDACTONE) 25 MG tablet TAKE 1 TABLET (25 MG TOTAL) BY MOUTH DAILY. PLEASE SCHEDULE APPOINTMENT FOR FURTHER REFILLS 30 tablet 0   SUMAtriptan (IMITREX) 100 MG tablet Take 1 tablet by mouth at migraine onset. Do not exceed 100 mg in 24 hours. 10 tablet 0   tamsulosin (FLOMAX) 0.4 MG CAPS capsule Take 0.4 mg by mouth daily.     topiramate (TOPAMAX) 50 MG tablet Take 1 tablet by mouth every morning and 2 tablets at night for migraine prevention. 270 tablet 1   mirtazapine (REMERON) 7.5 MG tablet Take 1 tablet (7.5 mg total) by mouth at bedtime. for anxiety and depression. (Patient not taking: Reported on 04/12/2023) 90  tablet 0   amLODipine (NORVASC) 5 MG tablet Take 1 tablet (5 mg total) by mouth daily. (Patient not taking: Reported on 12/03/2022) 90 tablet 3   levocetirizine (XYZAL) 5 MG tablet Take 1 tablet (5 mg total) by mouth at bedtime as needed for allergies. (Patient not taking: Reported on 02/08/2023) 90 tablet 3   Vitamin D, Ergocalciferol, (DRISDOL) 1.25 MG (50000 UNIT) CAPS capsule TAKE 1 CAPSULE BY MOUTH ONCE WEEKLY FOR 12 WEEKS. 12 capsule 0   No facility-administered medications prior to visit.     Per HPI unless specifically indicated in ROS section below Review of Systems  Constitutional:  Negative for fatigue and fever.  HENT:  Negative for congestion.   Eyes:  Negative for pain.  Respiratory:  Negative for cough and shortness of breath.   Cardiovascular:  Positive for chest pain. Negative for palpitations and leg swelling.  Gastrointestinal:  Negative for abdominal pain.  Genitourinary:  Negative for dysuria and vaginal bleeding.  Musculoskeletal:  Negative for back pain.  Neurological:  Negative for syncope, light-headedness and headaches.  Psychiatric/Behavioral:  Positive for dysphoric mood and sleep disturbance. Negative for self-injury. The patient is nervous/anxious.    Objective:  BP 100/60 (BP Location: Left Arm, Patient Position: Sitting, Cuff Size: Normal)   Pulse 73   Temp 98.8 F (37.1 C) (Temporal)   Ht 5\' 2"  (1.575 m)   Wt 151 lb 2 oz (68.5 kg)   SpO2 98%   BMI 27.64 kg/m   Wt Readings from Last 3 Encounters:  04/12/23 151 lb 2 oz (68.5 kg)  03/31/23 152 lb (68.9 kg)  03/22/23 154 lb (69.9 kg)      Physical Exam Constitutional:      General: She is not in acute distress.    Appearance: Normal appearance. She is well-developed. She is not ill-appearing or toxic-appearing.  HENT:     Head: Normocephalic.     Right Ear: Hearing, tympanic membrane, ear canal and external ear normal. Tympanic membrane is not erythematous, retracted or bulging.     Left Ear:  Hearing, tympanic membrane, ear canal and external ear normal. Tympanic membrane is not erythematous, retracted or bulging.     Nose: No mucosal edema or rhinorrhea.     Right Sinus: No maxillary sinus tenderness or frontal sinus tenderness.     Left Sinus: No maxillary sinus tenderness or frontal sinus tenderness.     Mouth/Throat:     Mouth: Oropharynx is clear and moist and mucous membranes are normal.     Pharynx: Uvula midline.  Eyes:     General: Lids are normal. Lids are everted, no foreign bodies appreciated.     Extraocular Movements: EOM normal.     Conjunctiva/sclera: Conjunctivae normal.     Pupils:  Pupils are equal, round, and reactive to light.  Neck:     Thyroid: No thyroid mass or thyromegaly.     Vascular: No carotid bruit.     Trachea: Trachea normal.  Cardiovascular:     Rate and Rhythm: Normal rate and regular rhythm.     Pulses: Normal pulses.     Heart sounds: Normal heart sounds, S1 normal and S2 normal. No murmur heard.    No friction rub. No gallop.     Comments: Small amount of left of midsternal tenderness to palpation Pulmonary:     Effort: Pulmonary effort is normal. No tachypnea or respiratory distress.     Breath sounds: Normal breath sounds. No decreased breath sounds, wheezing, rhonchi or rales.  Abdominal:     General: Bowel sounds are normal.     Palpations: Abdomen is soft.     Tenderness: There is no abdominal tenderness.  Musculoskeletal:     Cervical back: Normal range of motion and neck supple.  Skin:    General: Skin is warm, dry and intact.     Findings: No rash.  Neurological:     Mental Status: She is alert.  Psychiatric:        Mood and Affect: Mood is not anxious or depressed.        Speech: Speech normal.        Behavior: Behavior normal. Behavior is cooperative.        Thought Content: Thought content normal.        Cognition and Memory: Cognition and memory normal.        Judgment: Judgment normal.       Results for orders  placed or performed in visit on 03/22/23  Urine Culture   Specimen: Blood  Result Value Ref Range   MICRO NUMBER: [id number]    SPECIMEN QUALITY: Adequate    Sample Source NOT GIVEN    STATUS: FINAL    Result:      Less than 10,000 CFU/mL of single Gram negative organism isolated. No further testing will be performed. If clinically indicated, recollection using a method to minimize contamination, with prompt transfer to Urine Culture Transport Tube, is recommended.  VITAMIN D 25 Hydroxy (Vit-D Deficiency, Fractures)  Result Value Ref Range   VITD 45.22 30.00 - 100.00 ng/mL  Vitamin B12  Result Value Ref Range   Vitamin B-12 188 (L) 211 - 911 pg/mL  CBC  Result Value Ref Range   WBC 9.4 4.0 - 10.5 K/uL   RBC 4.99 3.87 - 5.11 Mil/uL   Platelets 313.0 150.0 - 400.0 K/uL   Hemoglobin 15.1 (H) 12.0 - 15.0 g/dL   HCT 87.5 (H) 64.3 - 32.9 %   MCV 95.3 78.0 - 100.0 fl   MCHC 31.7 30.0 - 36.0 g/dL   RDW 51.8 84.1 - 66.0 %  Basic metabolic panel  Result Value Ref Range   Sodium 143 135 - 145 mEq/L   Potassium 4.0 3.5 - 5.1 mEq/L   Chloride 110 96 - 112 mEq/L   CO2 23 19 - 32 mEq/L   Glucose, Bld 94 70 - 99 mg/dL   BUN 14 6 - 23 mg/dL   Creatinine, Ser 6.30 0.40 - 1.20 mg/dL   GFR 16.01 >09.32 mL/min   Calcium 9.9 8.4 - 10.5 mg/dL  IBC + Ferritin  Result Value Ref Range   Iron 117 42 - 145 ug/dL   Transferrin 355.7 322.0 - 360.0 mg/dL   Saturation Ratios 25.4 20.0 -  50.0 %   Ferritin 90.1 10.0 - 291.0 ng/mL   TIBC 326.2 250.0 - 450.0 mcg/dL  POCT Urinalysis Dipstick (Automated)  Result Value Ref Range   Color, UA yellow    Clarity, UA clear    Glucose, UA Negative Negative   Bilirubin, UA neg    Ketones, UA neg    Spec Grav, UA 1.020 1.010 - 1.025   Blood, UA neg    pH, UA 5.0 5.0 - 8.0   Protein, UA Positive (A) Negative   Urobilinogen, UA 0.2 0.2 or 1.0 E.U./dL   Nitrite, UA neg    Leukocytes, UA Small (1+) (A) Negative   *Note: Due to a large number of results and/or  encounters for the requested time period, some results have not been displayed. A complete set of results can be found in Results Review.    Assessment and Plan  Atypical chest pain Assessment & Plan: Acute, atypical Differential includes cardiac source, pleurisy , reflux and anxiety etiology. Most likely is anxiety cause. EKG in office today stable from previous, normal sinus rhythm, no ST changes no LVH.  She she will keep routine cardiology follow-up later this week. Return and ER precautions provided  Orders: -     EKG 12-Lead  GAD (generalized anxiety disorder) Assessment & Plan: Chronic, recent worsening.  Discussed side effect possibilities of mirtazapine.  She does not have bipolar disorder and so I do not think this is contraindicated. Discussed how mirtazapine is a good option in addition to her Cymbalta to improve her appetite and assist with weight gain, depression and insomnia. I encouraged her to consider going ahead and starting this but she can wait if she would prefer until upcoming psychiatry appointment on October 3.    Decreased appetite  Excessive weight loss Assessment & Plan: Chronic, stable in the last 2 weeks.  Encouraged initiation of mirtazapine as planned.   Chronic insomnia  Vitamin B 12 deficiency    No follow-ups on file.   Kerby Nora, MD

## 2023-04-12 NOTE — Telephone Encounter (Addendum)
Unable to reach pt or pts husband to get update on pt condition. Pt already has appt 04/12/23 at 10:40 with Dr Ermalene Searing. Sending note to Dr Ermalene Searing

## 2023-04-12 NOTE — Assessment & Plan Note (Addendum)
Acute, atypical Differential includes cardiac source, pleurisy , reflux and anxiety etiology. Most likely is anxiety cause. EKG in office today stable from previous, normal sinus rhythm, no ST changes no LVH.  She she will keep routine cardiology follow-up later this week. Return and ER precautions provided

## 2023-04-12 NOTE — Assessment & Plan Note (Signed)
Chronic, stable in the last 2 weeks.  Encouraged initiation of mirtazapine as planned.

## 2023-04-12 NOTE — Assessment & Plan Note (Signed)
Chronic, recent worsening.  Discussed side effect possibilities of mirtazapine.  She does not have bipolar disorder and so I do not think this is contraindicated. Discussed how mirtazapine is a good option in addition to her Cymbalta to improve her appetite and assist with weight gain, depression and insomnia. I encouraged her to consider going ahead and starting this but she can wait if she would prefer until upcoming psychiatry appointment on October 3.

## 2023-04-12 NOTE — Patient Instructions (Signed)
Start mirtazapine as planned.  Keep appt with cardiology and  psychiatry.

## 2023-04-13 ENCOUNTER — Ambulatory Visit: Payer: Medicare Other

## 2023-04-14 ENCOUNTER — Encounter: Payer: Self-pay | Admitting: Nurse Practitioner

## 2023-04-14 ENCOUNTER — Ambulatory Visit: Payer: Medicare Other | Admitting: Nurse Practitioner

## 2023-04-14 DIAGNOSIS — L218 Other seborrheic dermatitis: Secondary | ICD-10-CM | POA: Diagnosis not present

## 2023-04-14 DIAGNOSIS — L718 Other rosacea: Secondary | ICD-10-CM | POA: Diagnosis not present

## 2023-04-14 DIAGNOSIS — L02821 Furuncle of head [any part, except face]: Secondary | ICD-10-CM | POA: Diagnosis not present

## 2023-04-14 NOTE — Progress Notes (Deleted)
Office Visit    Patient Name: Katherine [name] Date of Encounter: 04/14/2023  Primary Care Provider:  Doreene Nest, NP Primary Cardiologist:  Julien Nordmann, MD    Chief Complaint    72 y.o. female with a history of chest pain and normal coronary arteries/abnormal stress testing, hypertension, hyperlipidemia, diastolic dysfunction, sinus tachycardia, Parkinson's, frontotemporal dementia versus Lewy body dementia, prediabetes, fatigue, GERD, anxiety, chronic back pain, and hypokalemia, who presents for follow-up related to chest pain.  Past Medical History    Past Medical History:  Diagnosis Date   Anemia 2018   Anxiety    Arthritis    Borderline diabetes    Cataract    Chest pain    a. 2007 Cath: nl cors.  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 03/2022 MV: EF >65%, no ischemia/infarct. No signif cor/Ao Ca2+. Low risk.   Chicken pox    age 67   Chronic back pain    Compressed discs. [facility]   Depression    Diastolic dysfunction    a. 06/2020 Echo: EF 60-65%, no rwma, GrI DD, nl RV size/fxn. RVSP 30.5 mmHg.   GERD (gastroesophageal reflux disease)    High cholesterol    Hypertension    Hypokalemia    Migraine headache    Obesity    Parkinson's disease    Pre-diabetes    okay now    Sinus tachycardia    a. 02/2022 Zio: predominantly sinus rhythm, avg of 99 bpm (77-134). Rare PACs/PVCs. Triggered events = sinus rhythm.   Past Surgical History:  Procedure Laterality Date   BLADDER SUSPENSION  2005   CARDIAC CATHETERIZATION     CESAREAN SECTION     CLOSED REDUCTION NASAL FRACTURE N/A 01/11/2020   Procedure: CLOSED REDUCTION NASAL FRACTURE;  Surgeon: Linus Salmons, MD;  Location: [facility];  Service: ENT;  Laterality: N/A;   ENDOMETRIAL ABLATION  2007   FOOT SURGERY     bilateral bunions   LUMBAR LAMINECTOMY/DECOMPRESSION MICRODISCECTOMY Bilateral 01/12/2016   Procedure:  Bilateral L4-5 Laminotomy/Foraminotomy;  Surgeon: Tressie Stalker, MD;  Location: [facility];  Service: Neurosurgery;  Laterality: Bilateral;  Bilateral L4-5 Laminotomy/Foraminotomy   TONSILLECTOMY  1971    Allergies  Allergies  Allergen Reactions   Chlorine Other (See Comments)    Smell causes lightheadedness.   Dilaudid [Hydromorphone]     hallucinations   Codeine Anxiety   Darvon Anxiety   Hydrocodone Anxiety   Meperidine Anxiety   Oxycodone Anxiety   Tramadol Anxiety   Victoza [Liraglutide] Nausea Only and Other (See Comments)    And weakness    History of Present Illness      72 y.o. y/o female with a history of chest pain and normal coronary arteries/abnormal stress testing, hypertension, hyperlipidemia, diastolic dysfunction, sinus tachycardia, Parkinson's, frontotemporal dementia versus Lewy body dementia, prediabetes, fatigue, GERD, anxiety, chronic back pain, and hypokalemia.  She previously underwent diagnostic catheterization in 2007, which showed normal coronary arteries.  Echocardiogram in December 2021 showed normal LV function with grade 1 diastolic dysfunction and RVSP of 30.5 mmHg.  In the setting of tachycardia and palpitations, event monitoring in August 2023 showed an average heart rate of 99 bpm with predominantly sinus rhythm and rare PACs and PVCs.  Triggered events were associated with sinus rhythm.  Stress testing performed in September 2023 showed normal LV function without evidence of ischemia or infarct, along with no significant coronary or aortic calcifications.  Home Medications    Current Outpatient Medications  Medication Sig Dispense Refill   DULoxetine (CYMBALTA) 30 MG capsule Take 30 mg by mouth daily.     DULoxetine (CYMBALTA) 60 MG capsule Take 60 mg by mouth every morning. Takes with her 30mg  capsule to equal 90 mg daily     famotidine (PEPCID) 20 MG tablet TAKE 1 TABLET (20 MG TOTAL) BY MOUTH DAILY. FOR HEARTBURN. 90 tablet 3    gabapentin (NEURONTIN) 300 MG capsule TAKE 1 CAPSULE (300 MG TOTAL) BY MOUTH AT BEDTIME. FOR BACK PAIN. 90 capsule 1   mirtazapine (REMERON) 7.5 MG tablet Take 1 tablet (7.5 mg total) by mouth at bedtime. for anxiety and depression. (Patient not taking: Reported on 04/12/2023) 90 tablet 0   Multiple Vitamin (MULTIVITAMIN) tablet Take 1 tablet by mouth daily.     NUPLAZID 34 MG CAPS Take 1 capsule by mouth daily.     OVER THE COUNTER MEDICATION [employer] vitamin     OVER THE COUNTER MEDICATION 2 tablets daily. Air Shield     pantoprazole (PROTONIX) 40 MG tablet TAKE 1 TABLET (40 MG TOTAL) BY MOUTH 2 (TWO) TIMES DAILY BEFORE A MEAL. FOR HEARTBURN. 180 tablet 1   propranolol (INDERAL) 20 MG tablet TAKE 1 TABLET (20 MG TOTAL) BY MOUTH 3 (THREE) TIMES DAILY AS NEEDED. FOR HEARTRATE >110 BPM 90 tablet 2   spironolactone (ALDACTONE) 25 MG tablet TAKE 1 TABLET (25 MG TOTAL) BY MOUTH DAILY. PLEASE SCHEDULE APPOINTMENT FOR FURTHER REFILLS 30 tablet 0   SUMAtriptan (IMITREX) 100 MG tablet Take 1 tablet by mouth at migraine onset. Do not exceed 100 mg in 24 hours. 10 tablet 0   tamsulosin (FLOMAX) 0.4 MG CAPS capsule Take 0.4 mg by mouth daily.     topiramate (TOPAMAX) 50 MG tablet Take 1 tablet by mouth every morning and 2 tablets at night for migraine prevention. 270 tablet 1   No current facility-administered medications for this visit.     Review of Systems    ***.  All other systems reviewed and are otherwise negative except as noted above.    Physical Exam    VS:  There were no vitals taken for this visit. , BMI There is no height or weight on file to calculate BMI.     GEN: Well nourished, well developed, in no acute distress. HEENT: normal. Neck: Supple, no JVD, carotid bruits, or masses. Cardiac: RRR, no murmurs, rubs, or gallops. No clubbing, cyanosis, edema.  Radials 2+/PT 2+ and equal bilaterally.  Respiratory:  Respirations regular and unlabored, clear to auscultation bilaterally. GI:  Soft, nontender, nondistended, BS + x 4. MS: no deformity or atrophy. Skin: warm and dry, no rash. Neuro:  Strength and sensation are intact. Psych: Normal affect.  Accessory Clinical Findings    ECG personally reviewed by me today -    *** - no acute changes.  Lab Results  Component Value Date   WBC 9.4 03/22/2023   HGB 15.1 (H) 03/22/2023   HCT 47.6 (H) 03/22/2023   MCV 95.3 03/22/2023   PLT 313.0 03/22/2023   Lab Results  Component Value Date   CREATININE 0.80 03/22/2023   BUN 14 03/22/2023   NA 143 03/22/2023   K 4.0 03/22/2023   CL 110 03/22/2023   CO2 23 03/22/2023   Lab Results  Component Value Date   ALT 13 10/20/2022   AST 17 10/20/2022   ALKPHOS 122 10/20/2022   BILITOT 0.3 10/20/2022   Lab Results  Component Value Date   CHOL 211 (H) 03/10/2021   HDL 57.40 03/10/2021   LDLCALC 60 04/15/2020   LDLDIRECT 143.0 03/10/2021   TRIG 266.0 (H) 03/10/2021   CHOLHDL 4 03/10/2021    Lab Results  Component Value Date   HGBA1C 5.9 (H) 07/28/2022    Assessment & Plan    1.  ***  Nicolasa Ducking, NP 04/14/2023, 1:16 PM

## 2023-04-15 NOTE — Progress Notes (Unsigned)
Psychiatric Initial Adult Assessment   Patient Identification: EZRA MARQUESS MRN:  [id number] Date of Evaluation:  04/15/2023 Referral Source: *** Chief Complaint:  No chief complaint on file.  Visit Diagnosis: No diagnosis found.  History of Present Illness:   ILISSA ROSNER is a 72 y.o. year old female with a history of depression, anxiety, mild cognitive impairment, who is referred for   She was seen by neurology in March 2024.  She was reportedly diagnosed Lew body dementia at [facility] clinic.  "This 72 year old woman from [place] was seen for additional opinion on outside diagnoses of Lewy body disease. Symptoms have included cognitive decline (which she is relatively less convinced of), a tendency toward delirium in perioperative and other hospital settings, delusions and hallucinations which have been responsive to pimavanserin, and gait slowing with posture stooping. She scored 30/38 on the [facility] and had some subtle parkinsonian signs on exam. The clinical picture is suggestive of Lewy body disease but we feel further data would be helpful to confirm this and/or guide next steps, specifically with updated brain MRI and screening labs, neuropsychological assessment, brain FDG PET, and CSF studies including biomarkers for Alzheimer's and alpha-synuclein"  CSF Alzheimer's biomarker was negative, a beta 42 more than 1700, total 12-18, phosphorylated tau 20.1,--- conclusion was unable to calculate the p-Tau/Abeta 42 ratio because the measured A beta 42 concentration is above the measuring limit of 1700 PG /ml, normal concentration of a beta 42 present in this individual is not consistent with the presence of pathological change associated with Alzheimer's disease,   CSF protein 58, glucose 63, nucleated cell 8, 81% lymphocytes, extensive neuro immunology CSF panel were all negative,   Laboratory, normal TSH, methylmalonic acid level, PET CT brain metabolic evaluation showed  hypometabolism involving both cerebral hemispheres, basal ganglia and brainstem   MRI of the brain mild generalized parenchymal volume loss, without any particular lobar predominance, mild small vessel disease,   Genetic counseling telemetry medication by [facility] clinic on October 26, 2021, significant mental illness history, father was diagnosed with bipolar in his 78s, history of traumatic brain injury at 59, required mental hospital stay, passed away in his 53s, paternal uncle completed suicide at age 65, history of alcoholism, paternal aunt died at age 54 due to complication of pneumonia, prescription medication addition,    [facility] frontotemporal dementia panel was planned, because suspicious for Lewy body dementia versus Hereditary frontotemporal dementia--- no genetic etiology was identified,   Last visit with Dr. Nedra Hai at [facility] on May 04 2022, continue pimavanserin 34 mg at bedtime, husband reported it has helped her hallucination delusion,  Patient was driven by her husband today, but alone at today's clinical visit, spent most of the time explaining why she is here, worried about the husband is seeking for power of attorney on her, pushing her into the diagnosis of dementia, MoCA examination is 25/30 today   Buspirone- diarrhea  Associated Signs/Symptoms: Depression Symptoms:  {DEPRESSION SYMPTOMS:20000} (Hypo) Manic Symptoms:  {BHH MANIC SYMPTOMS:22872} Anxiety Symptoms:  {BHH ANXIETY SYMPTOMS:22873} Psychotic Symptoms:  {BHH PSYCHOTIC SYMPTOMS:22874} PTSD Symptoms: {BHH PTSD SYMPTOMS:22875}  Past Psychiatric History:  Outpatient:  Psychiatry admission:  Previous suicide attempt:  Past trials of medication:  History of violence:  History of head injury:   Previous Psychotropic Medications: {YES/NO:21197}  Substance Abuse History in the last 12 months:  {yes no:314532}  Consequences of Substance Abuse: {BHH CONSEQUENCES OF SUBSTANCE ABUSE:22880}  Past Medical History:  Past  Medical History:  Diagnosis Date  Anemia 2018   Anxiety    Arthritis    Borderline diabetes    Cataract    Chest pain    a. 2007 Cath: nl cors.  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 03/2022 MV: EF >65%, no ischemia/infarct. No signif cor/Ao Ca2+. Low risk.   Chicken pox    age 54   Chronic back pain    Compressed discs. [facility]   Depression    Diastolic dysfunction    a. 06/2020 Echo: EF 60-65%, no rwma, GrI DD, nl RV size/fxn. RVSP 30.5 mmHg.   GERD (gastroesophageal reflux disease)    High cholesterol    Hypertension    Hypokalemia    Migraine headache    Obesity    Parkinson's disease    Pre-diabetes    okay now    Sinus tachycardia    a. 02/2022 Zio: predominantly sinus rhythm, avg of 99 bpm (77-134). Rare PACs/PVCs. Triggered events = sinus rhythm.    Past Surgical History:  Procedure Laterality Date   BLADDER SUSPENSION  2005   CARDIAC CATHETERIZATION     CESAREAN SECTION     CLOSED REDUCTION NASAL FRACTURE N/A 01/11/2020   Procedure: CLOSED REDUCTION NASAL FRACTURE;  Surgeon: Linus Salmons, MD;  Location: [facility];  Service: ENT;  Laterality: N/A;   ENDOMETRIAL ABLATION  2007   FOOT SURGERY     bilateral bunions   LUMBAR LAMINECTOMY/DECOMPRESSION MICRODISCECTOMY Bilateral 01/12/2016   Procedure: Bilateral L4-5 Laminotomy/Foraminotomy;  Surgeon: Tressie Stalker, MD;  Location: [facility];  Service: Neurosurgery;  Laterality: Bilateral;  Bilateral L4-5 Laminotomy/Foraminotomy   TONSILLECTOMY  1971    Family Psychiatric History: ***  Family History:  Family History  Problem Relation Age of Onset   Heart disease Mother    Hypertension Mother    Heart failure Mother        CHF   Diabetes Mother    Cancer Mother        CERVICAL CANCER   Atrial fibrillation Mother    Heart disease Father    Diabetes Father    Rashes / Skin problems Father    Hypertension Father     Cirrhosis Father        liver disease non alcoholic   Diabetes Sister    Breast cancer Sister 72   Diabetes Mellitus II Sister    Heart disease Brother    Heart failure Brother        CHF   Diabetes Brother    Cancer Brother 66       THROAT AND NECK   Esophageal cancer Brother    Lung cancer Brother    Atrial fibrillation Brother    Cancer Maternal Grandfather    Colon cancer Neg Hx    Stomach cancer Neg Hx    Rectal cancer Neg Hx     Social History:   Social History   Socioeconomic History   Marital status: Married    Spouse name: Not on file   Number of children: 1   Years of education: Not on file   Highest education level: Not on file  Occupational History   Occupation: Magazine features editor: [employer]    Comment: retired-special needs teacher  Tobacco Use   Smoking status: Never   Smokeless tobacco: Never  Vaping Use   Vaping status: Never Used  Substance and Sexual Activity   Alcohol use: No   Drug use:  No   Sexual activity: Yes    Partners: Male    Birth control/protection: Post-menopausal  Other Topics Concern   Not on file  Social History Narrative   Married.   One son is 79, lives in [place].   Retired from Agricultural consultant.   Enjoys substitute teaching, gardening, cooking.   Social Determinants of Health   Financial Resource Strain: Low Risk  (07/30/2022)   Overall Financial Resource Strain (CARDIA)    Difficulty of Paying Living Expenses: Not hard at all  Food Insecurity: No Food Insecurity (07/30/2022)   Hunger Vital Sign    Worried About Running Out of Food in the Last Year: Never true    Ran Out of Food in the Last Year: Never true  Transportation Needs: No Transportation Needs (07/30/2022)   PRAPARE - Administrator, Civil Service (Medical): No    Lack of Transportation (Non-Medical): No  Physical Activity: Unknown (07/30/2022)   Exercise Vital Sign    Days of Exercise per Week: 0 days    Minutes of Exercise per Session:  Not on file  Stress: Stress Concern Present (07/30/2022)   [employer] of Occupational Health - Occupational Stress Questionnaire    Feeling of Stress : To some extent  Social Connections: Unknown (07/30/2022)   Social Connection and Isolation Panel [NHANES]    Frequency of Communication with Friends and Family: Not on file    Frequency of Social Gatherings with Friends and Family: Not on file    Attends Religious Services: Not on file    Active Member of Clubs or Organizations: Not on file    Attends Banker Meetings: Not on file    Marital Status: Married    Additional Social History: ***  Allergies:   Allergies  Allergen Reactions   Chlorine Other (See Comments)    Smell causes lightheadedness.   Dilaudid [Hydromorphone]     hallucinations   Codeine Anxiety   Darvon Anxiety   Hydrocodone Anxiety   Meperidine Anxiety   Oxycodone Anxiety   Tramadol Anxiety   Victoza [Liraglutide] Nausea Only and Other (See Comments)    And weakness    Metabolic Disorder Labs: Lab Results  Component Value Date   HGBA1C 5.9 (H) 07/28/2022   MPG 120 01/09/2016   MPG 131 (H) 09/10/2014   No results found for: "PROLACTIN" Lab Results  Component Value Date   CHOL 211 (H) 03/10/2021   TRIG 266.0 (H) 03/10/2021   HDL 57.40 03/10/2021   CHOLHDL 4 03/10/2021   VLDL 53.2 (H) 03/10/2021   LDLCALC 60 04/15/2020   LDLCALC 118 (H) 07/09/2019   Lab Results  Component Value Date   TSH 1.180 12/02/2021    Therapeutic Level Labs: No results found for: "LITHIUM" No results found for: "CBMZ" No results found for: "VALPROATE"  Current Medications: Current Outpatient Medications  Medication Sig Dispense Refill   DULoxetine (CYMBALTA) 30 MG capsule Take 30 mg by mouth daily.     DULoxetine (CYMBALTA) 60 MG capsule Take 60 mg by mouth every morning. Takes with her 30mg  capsule to equal 90 mg daily     famotidine (PEPCID) 20 MG tablet TAKE 1 TABLET (20 MG TOTAL) BY MOUTH  DAILY. FOR HEARTBURN. 90 tablet 3   gabapentin (NEURONTIN) 300 MG capsule TAKE 1 CAPSULE (300 MG TOTAL) BY MOUTH AT BEDTIME. FOR BACK PAIN. 90 capsule 1   mirtazapine (REMERON) 7.5 MG tablet Take 1 tablet (7.5 mg total) by mouth at bedtime. for anxiety and  depression. (Patient not taking: Reported on 04/12/2023) 90 tablet 0   Multiple Vitamin (MULTIVITAMIN) tablet Take 1 tablet by mouth daily.     NUPLAZID 34 MG CAPS Take 1 capsule by mouth daily.     OVER THE COUNTER MEDICATION [employer] vitamin     OVER THE COUNTER MEDICATION 2 tablets daily. Air Shield     pantoprazole (PROTONIX) 40 MG tablet TAKE 1 TABLET (40 MG TOTAL) BY MOUTH 2 (TWO) TIMES DAILY BEFORE A MEAL. FOR HEARTBURN. 180 tablet 1   propranolol (INDERAL) 20 MG tablet TAKE 1 TABLET (20 MG TOTAL) BY MOUTH 3 (THREE) TIMES DAILY AS NEEDED. FOR HEARTRATE >110 BPM 90 tablet 2   spironolactone (ALDACTONE) 25 MG tablet TAKE 1 TABLET (25 MG TOTAL) BY MOUTH DAILY. PLEASE SCHEDULE APPOINTMENT FOR FURTHER REFILLS 30 tablet 0   SUMAtriptan (IMITREX) 100 MG tablet Take 1 tablet by mouth at migraine onset. Do not exceed 100 mg in 24 hours. 10 tablet 0   tamsulosin (FLOMAX) 0.4 MG CAPS capsule Take 0.4 mg by mouth daily.     topiramate (TOPAMAX) 50 MG tablet Take 1 tablet by mouth every morning and 2 tablets at night for migraine prevention. 270 tablet 1   No current facility-administered medications for this visit.    Musculoskeletal: Strength & Muscle Tone: within normal limits Gait & Station: normal Patient leans: N/A  Psychiatric Specialty Exam: Review of Systems  There were no vitals taken for this visit.There is no height or weight on file to calculate BMI.  General Appearance: {Appearance:22683}  Eye Contact:  {BHH EYE CONTACT:22684}  Speech:  Clear and Coherent  Volume:  Normal  Mood:  {BHH MOOD:22306}  Affect:  {Affect (PAA):22687}  Thought Process:  Coherent  Orientation:  Full (Time, Place, and Person)  Thought Content:   Logical  Suicidal Thoughts:  {ST/HT (PAA):22692}  Homicidal Thoughts:  {ST/HT (PAA):22692}  Memory:  Immediate;   Good  Judgement:  {Judgement (PAA):22694}  Insight:  {Insight (PAA):22695}  Psychomotor Activity:  Normal  Concentration:  Concentration: Good and Attention Span: Good  Recall:  Good  Fund of Knowledge:Good  Language: Good  Akathisia:  No  Handed:  Right  AIMS (if indicated):  not done  Assets:  Communication Skills Desire for Improvement  ADL's:  Intact  Cognition: WNL  Sleep:  {BHH GOOD/FAIR/POOR:22877}   Screenings: GAD-7    Garment/textile technologist Visit from 03/22/2023 in [facility] at [facility] Office Visit from 02/08/2023 [facility] Office Visit from 09/20/2022 in [facility]  Total GAD-7 Score 9 6 8       Mini-Mental    Flowsheet Row Clinical Support from 07/06/2018 in [facility]  Total Score (max 30 points ) 20      PHQ2-9    Flowsheet Row Office Visit from 04/12/2023 in [facility] [facility] Office Visit from 03/22/2023 in [facility] Office Visit from 02[facility]08/2023 in [facility] [facility] Office Visit from 09/20/2022 in [facility] [place] [facility] Clinical Support from 07/30/2022 in [facility] [facility] [place]  PHQ-2 Total Score 2 2 3 2  0  PHQ-9 Total Score 9 8 9 5  --      Flowsheet Row ED from 12/02/2021 in [facility] at [facility] ED from 05/11/2021 in [facility] at  [place]  ED from 02/27/2021 in [facility] at [facility] RISK CATEGORY No Risk No Risk No Risk       Assessment and Plan:  Assessment  Plan   The patient demonstrates the following risk factors for suicide: Chronic risk factors for suicide include: {Chronic Risk Factors for  ZOXWRUE:45409811}. Acute risk factors for suicide include: {Acute Risk Factors for BJYNWGN:56213086}. Protective factors for this patient include: {Protective Factors for Suicide VHQI:69629528}. Considering these factors, the overall suicide risk at this point appears to be {Desc; low/moderate/high:110033}. Patient {ACTION; IS/IS UXL:24401027} appropriate for outpatient follow up.   Collaboration of Care: {BH OP Collaboration of Care:21014065}  Patient/Guardian was advised Release of Information must be obtained prior to any record release in order to collaborate their care with an outside provider. Patient/Guardian was advised if they have not already done so to contact the registration department to sign all necessary forms in order for [place] to release information regarding their care.   Consent: Patient/Guardian gives verbal consent for treatment and assignment of benefits for services provided during this visit. Patient/Guardian expressed understanding and agreed to proceed.   Neysa Hotter, MD 9/27/202410:58 AM

## 2023-04-20 ENCOUNTER — Ambulatory Visit: Payer: Medicare Other

## 2023-04-21 ENCOUNTER — Telehealth: Payer: Self-pay | Admitting: Psychiatry

## 2023-04-21 ENCOUNTER — Encounter: Payer: Self-pay | Admitting: Psychiatry

## 2023-04-21 ENCOUNTER — Ambulatory Visit: Payer: Medicare Other | Admitting: Psychiatry

## 2023-04-21 VITALS — BP 137/78 | HR 99 | Temp 97.6°F | Ht 62.0 in | Wt 152.2 lb

## 2023-04-21 DIAGNOSIS — F22 Delusional disorders: Secondary | ICD-10-CM

## 2023-04-21 DIAGNOSIS — F419 Anxiety disorder, unspecified: Secondary | ICD-10-CM | POA: Diagnosis not present

## 2023-04-21 DIAGNOSIS — R419 Unspecified symptoms and signs involving cognitive functions and awareness: Secondary | ICD-10-CM

## 2023-04-21 MED ORDER — QUETIAPINE FUMARATE 25 MG PO TABS: 25.0000 mg | ORAL_TABLET | Freq: Every day | ORAL | 1 refills | Status: DC

## 2023-04-21 NOTE — Telephone Encounter (Signed)
Patient's son called stating he is returning a call from this office. Son not sure what the call was in regards to.-Please advise

## 2023-04-21 NOTE — Patient Instructions (Signed)
Continue duloxetine 90 mg daily  Start quetiapine 25 mg night  Obtain lab (TSH, folate) Next appointment: 11/21 at 8:30

## 2023-04-21 NOTE — Telephone Encounter (Signed)
I was able to speak with him. Details in the note. Thanks.

## 2023-04-25 ENCOUNTER — Telehealth: Payer: Self-pay | Admitting: Neurology

## 2023-04-25 ENCOUNTER — Ambulatory Visit: Payer: Medicare Other | Admitting: Family Medicine

## 2023-04-25 NOTE — Telephone Encounter (Signed)
Called pt 4x on home number and kept on getting hung up on. Called cell phone number and LVM letting pt know that the 10/10 appt is being cx due to provider being out. Left office number for pt to call back and R/s.

## 2023-04-26 ENCOUNTER — Telehealth: Payer: Self-pay | Admitting: Primary Care

## 2023-04-26 ENCOUNTER — Ambulatory Visit: Payer: Medicare Other | Admitting: Primary Care

## 2023-04-26 NOTE — Telephone Encounter (Signed)
FYI: This call has been transferred to Access Nurse. Once the result note has been entered staff can address the message at that time.  Patient called in with the following symptoms:  Red Word: Lesions on scalp, itchy, patient has seen insects in scalp. Requested to speak with a nurse    Please advise at Mobile [phone] (mobile)  Message is routed to Provider Pool and [facility] Triage

## 2023-04-26 NOTE — Telephone Encounter (Signed)
Per access note pt agreed to go to ED but do not see where pt has gone to ED and pt has appt with Allayne Gitelman NP on 04/27/23 at 2:40. Sending note to Allayne Gitelman NP and [facility] pool.

## 2023-04-26 NOTE — Telephone Encounter (Signed)
Patient does not need ED evaluation for this.  We will see her tomorrow as scheduled.

## 2023-04-27 ENCOUNTER — Telehealth: Payer: Self-pay

## 2023-04-27 ENCOUNTER — Ambulatory Visit: Payer: Medicare Other | Admitting: Primary Care

## 2023-04-27 ENCOUNTER — Ambulatory Visit: Payer: Medicare Other

## 2023-04-27 NOTE — Telephone Encounter (Signed)
I called her, and her husband answered. He states that she is asleep now. According to him, she experienced a tingling sensation on her tongue and some tongue swelling after taking quetiapine. However, she is comfortable and has no shortness of breath, wheezing, or any other symptoms that would raise concern for anaphylaxis. He agreed to discontinue quetiapine for now. Of note, she reportedly sleeps only a few hours at night, and takes nap several hours during the day.  Since we do not yet have a ROI to speak with her husband directly, he agreed that I will call back when she is awake to gather more information and ensure she is informed.

## 2023-04-27 NOTE — Telephone Encounter (Signed)
pt husband called he states that the seroquel is not working that she been having some bad dreams but that she sleeps in spells she will sleep about 2-3 hours and then she wakes up and she can't get back to sleep. Pt was last seen on 10-3 next appt 11-21

## 2023-04-28 ENCOUNTER — Other Ambulatory Visit: Payer: Self-pay | Admitting: Psychiatry

## 2023-04-28 ENCOUNTER — Encounter: Payer: Self-pay | Admitting: Primary Care

## 2023-04-28 ENCOUNTER — Telehealth: Payer: Self-pay | Admitting: Psychiatry

## 2023-04-28 ENCOUNTER — Ambulatory Visit: Payer: Medicare Other | Admitting: Neurology

## 2023-04-28 MED ORDER — TRAZODONE HCL 50 MG PO TABS: 25.0000 mg | ORAL_TABLET | Freq: Every day | ORAL | 0 refills | Status: DC

## 2023-04-28 NOTE — Telephone Encounter (Signed)
Called the patient.  She states that she took quetiapine last night again.  She had a tingling sensation in her tongue. She slept only a few hours, and could not sleep afterwards. She denies SI. She agrees with the following.  - discontinue quetiapine  - start trazodone 25-50 mg at night as needed for sleep (hold for drowsiness) - next appointment on 10/15 at 8 30, with her husband (to obtain more collaterals)

## 2023-04-28 NOTE — Telephone Encounter (Signed)
Patient came in for appointment that was scheduled today and cancelled due to Dr. Terrace Arabia having a meeting come up. Stated she did not get our phone calls about the appointment being rescheduled. She was rescheduled for December 3rd, but not happy with waiting. Stated she has waited a while for this appointment and wants to get in ASAP. I asked if she has done neuropsych testing completed yet, she said she is scheduled to have this doen on 06/19/23 with Dr. Lonia Chimera where we referred her. Called office and LVM to confirm this as I don't see it in Care Everywhere. She said she was wanting this appointment to discuss something on her scans that Dr. Terrace Arabia brought up and wants to investigate this further. I asked for clarification on this and she said she couldn't remember what it was, but something on her scans was mentioned and she wanted some more information. Please advise if there is somewhere I could work this patient in for a sooner appointment.

## 2023-04-28 NOTE — Telephone Encounter (Signed)
Can off her next wed 10/16 in person or virtual appt, overlap with injection appt.

## 2023-04-28 NOTE — Progress Notes (Deleted)
Date:  04/28/2023   ID:  Katherine [name], Braddock [birth year], MRN [id number]  Patient Location:  [address] [place] [address]   Provider location:   [facility], [place] office  PCP:  Doreene Nest, NP  Cardiologist:  [facility]  No chief complaint on file.   History of Present Illness:    Katherine [name] is a 72 y.o. female  past medical history of CP with no CAD on cath 2007,  HTN  Morbid obesity Chronic back pain, prior back surgery Possible sleep apnea by history Parkinson's , seen at [facility] Jan 2023, frontotemporal dementia (FTD) vs Lewy Body Dementia.  She returns today for routine follow-up Of her blood pressure, history of hypokalemia  Last seen in clinic by myself 8/23   Reports having chronic insomnia  Last week with left side chest pain, reports it was severe radiating down her left arm lasted for quite a while went away without intervention Check vitals at the time, pulse "was 35", BP was low Got migraine h/a shortly after, took a medication for this Next two days, pulse low Seen by GI, pulse 45 bpm No weak EKG obtained to confirm rhythm  EGD next week for GERD symptoms, regurgitation which is chronic issue  Denies chest pain on walking  EKG personally reviewed by myself on todays visit  Sinus tach rate 101 no significant ST or T wave changes  Other past medical history reviewed Seen in the hospital Dec 02, 2021 with near syncope dizziness and a near-fall.  Patient also reports some nausea without vomiting following the incident had not eaten , skipped breakfast , was rushing through the airport to catch a flight that was ultimately delayed.  Patient began to experience some near syncopal episodes, was able to lower herself to the ground holding onto a chair.  She has had previous episodes of similar incidences  Previously on simvastatin and carvedilol  Previously reported having hallucinations at  times On last clinic visit, thinking of hiring a private detective to monitor her husband who she feels may be having an affair, debit card keeps going missing, money missing from her account, jewelry missing, feels someone is coming into her house and taking her belongings On NUPLAZID  Followed by neurology  Echo   1. Left ventricular ejection fraction, by estimation, is 60 to 65%. The  left ventricle has normal function. The left ventricle has no regional  wall motion abnormalities. Left ventricular diastolic parameters are  consistent with Grade I diastolic  dysfunction (impaired relaxation).   2. Right ventricular systolic function is normal. The right ventricular  size is normal. There is normal pulmonary artery systolic pressure. The  estimated right ventricular systolic pressure is 30.5 mmHg.    Past Medical History:  Diagnosis Date   Anemia 2018   Anxiety    Arthritis    Borderline diabetes    Cataract    Chest pain    a. 2007 Cath: nl cors.  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 03/2022 MV: EF >65%, no ischemia/infarct. No signif cor/Ao Ca2+. Low risk.   Chicken pox    age 36   Chronic back pain    Compressed discs. [facility]   Depression    Diastolic dysfunction    a. 06/2020 Echo: EF 60-65%, no rwma, GrI DD, nl RV size/fxn. RVSP 30.5 mmHg.   GERD (gastroesophageal reflux  disease)    High cholesterol    Hypertension    Hypokalemia    Migraine headache    Obesity    Parkinson's disease (HCC)    Pre-diabetes    okay now    Sinus tachycardia    a. 02/2022 Zio: predominantly sinus rhythm, avg of 99 bpm (77-134). Rare PACs/PVCs. Triggered events = sinus rhythm.   Past Surgical History:  Procedure Laterality Date   BLADDER SUSPENSION  2005   CARDIAC CATHETERIZATION     CESAREAN SECTION     CLOSED REDUCTION NASAL FRACTURE N/A 01/11/2020   Procedure: CLOSED REDUCTION NASAL FRACTURE;  Surgeon:  Linus Salmons, MD;  Location: [facility];  Service: ENT;  Laterality: N/A;   ENDOMETRIAL ABLATION  2007   FOOT SURGERY     bilateral bunions   LUMBAR LAMINECTOMY/DECOMPRESSION MICRODISCECTOMY Bilateral 01/12/2016   Procedure: Bilateral L4-5 Laminotomy/Foraminotomy;  Surgeon: Tressie Stalker, MD;  Location: [facility];  Service: Neurosurgery;  Laterality: Bilateral;  Bilateral L4-5 Laminotomy/Foraminotomy   TONSILLECTOMY  1971     Allergies:   Chlorine, Dilaudid [hydromorphone], Codeine, Darvon, Hydrocodone, Meperidine, Oxycodone, Tramadol, and Victoza [liraglutide]   Social History   Tobacco Use   Smoking status: Never   Smokeless tobacco: Never  Vaping Use   Vaping status: Never Used  Substance Use Topics   Alcohol use: No   Drug use: No     Current Outpatient Medications on File Prior to Visit  Medication Sig Dispense Refill   DULoxetine (CYMBALTA) 30 MG capsule Take 30 mg by mouth daily.     DULoxetine (CYMBALTA) 60 MG capsule Take 60 mg by mouth every morning. Takes with her 30mg  capsule to equal 90 mg daily     famotidine (PEPCID) 20 MG tablet TAKE 1 TABLET (20 MG TOTAL) BY MOUTH DAILY. FOR HEARTBURN. 90 tablet 3   Multiple Vitamin (MULTIVITAMIN) tablet Take 1 tablet by mouth daily.     NUPLAZID 34 MG CAPS Take 1 capsule by mouth daily.     pantoprazole (PROTONIX) 40 MG tablet TAKE 1 TABLET (40 MG TOTAL) BY MOUTH 2 (TWO) TIMES DAILY BEFORE A MEAL. FOR HEARTBURN. 180 tablet 1   propranolol (INDERAL) 20 MG tablet TAKE 1 TABLET (20 MG TOTAL) BY MOUTH 3 (THREE) TIMES DAILY AS NEEDED. FOR HEARTRATE >110 BPM 90 tablet 2   QUEtiapine (SEROQUEL) 25 MG tablet Take 1 tablet (25 mg total) by mouth at bedtime. 30 tablet 1   spironolactone (ALDACTONE) 25 MG tablet TAKE 1 TABLET (25 MG TOTAL) BY MOUTH DAILY. PLEASE SCHEDULE APPOINTMENT FOR FURTHER REFILLS 30 tablet 0   SUMAtriptan (IMITREX) 100 MG tablet Take 1 tablet by mouth at migraine onset. Do not exceed 100 mg in 24 hours.  10 tablet 0   tamsulosin (FLOMAX) 0.4 MG CAPS capsule Take 0.4 mg by mouth.     topiramate (TOPAMAX) 50 MG tablet Take 1 tablet by mouth every morning and 2 tablets at night for migraine prevention. 270 tablet 1   No current facility-administered medications on file prior to visit.     Family Hx: The patient's family history includes Atrial fibrillation in her brother and mother; Bipolar disorder in her father and sister; Breast cancer (age of onset: 10) in her sister; Cancer in her maternal grandfather and mother; Cancer (age of onset: 35) in her brother; Cirrhosis in her father; Depression in her mother; Diabetes in her brother, father, mother, and sister; Diabetes Mellitus II in her sister; Esophageal cancer in her brother; Heart disease  in her brother, father, and mother; Heart failure in her brother and mother; Hypertension in her father and mother; Lung cancer in her brother; Rashes / Skin problems in her father. There is no history of Colon cancer, Stomach cancer, or Rectal cancer.  ROS:   Please see the history of present illness.    Review of Systems  Constitutional: Negative.   HENT: Negative.    Respiratory: Negative.    Cardiovascular: Negative.   Gastrointestinal: Negative.   Musculoskeletal: Negative.   Neurological:  Positive for dizziness.  Psychiatric/Behavioral: Negative.    All other systems reviewed and are negative.    Labs/Other Tests and Data Reviewed:    Recent Labs: 10/20/2022: ALT 13 03/22/2023: BUN 14; Creatinine, Ser 0.80; Hemoglobin 15.1; Platelets 313.0; Potassium 4.0; Sodium 143   Recent Lipid Panel Lab Results  Component Value Date/Time   CHOL 211 (H) 03/10/2021 08:45 AM   CHOL 123 06/22/2013 08:09 AM   TRIG 266.0 (H) 03/10/2021 08:45 AM   HDL 57.40 03/10/2021 08:45 AM   HDL 48 06/22/2013 08:09 AM   CHOLHDL 4 03/10/2021 08:45 AM   LDLCALC 60 04/15/2020 12:41 PM   LDLCALC 60 06/22/2013 08:09 AM   LDLDIRECT 143.0 03/10/2021 08:45 AM    Wt Readings  from Last 3 Encounters:  04/12/23 151 lb 2 oz (68.5 kg)  03/31/23 152 lb (68.9 kg)  03/22/23 154 lb (69.9 kg)     Exam:    There were no vitals taken for this visit. Constitutional:  oriented to person, place, and time. No distress.  HENT:  Head: Grossly normal Eyes:  no discharge. No scleral icterus.  Neck: No JVD, no carotid bruits  Cardiovascular: Regular rate and rhythm, no murmurs appreciated Pulmonary/Chest: Clear to auscultation bilaterally, no wheezes or rails Abdominal: Soft.  no distension.  no tenderness.  Musculoskeletal: Normal range of motion Neurological:  normal muscle tone. Coordination normal. No atrophy Skin: Skin warm and dry Psychiatric: normal affect, pleasant  ASSESSMENT & PLAN:    Problem List Items Addressed This Visit   None   Preop endiocsopy Overall she is Acceptable risk for procedure Will take propranolol before procedure given chronic sinus tachycardia Recent episode of chest pain with atypical and typical features Myoview ordered to be completed before endoscopy if scheduling will allow  Dizziness Chronic issue, worse in the Am She previously held [employer] None recently, no regular exercise program Denies any recent falls  Dx with parkinsons, Followed by neurology  Tachycardia Used to be on coreg We have started prescription of propranolol 20 mg 3 times daily as needed for heart rate over 110  Parkinson's Followed by neurology, seen at [facility] On [place]    Total encounter time more than 30 minutes  Greater than 50% was spent in counseling and coordination of care with the patient   Signed, Julien Nordmann, MD  [facility] [address], [place], [place] [address]

## 2023-04-28 NOTE — Telephone Encounter (Signed)
Patient is scheduled for neuropsych testing 12/3 at 12:30 with Dr. Lonia Chimera at [facility].

## 2023-04-29 ENCOUNTER — Ambulatory Visit: Payer: Medicare Other | Admitting: Cardiovascular Disease

## 2023-04-29 ENCOUNTER — Ambulatory Visit: Payer: Medicare Other | Admitting: Family Medicine

## 2023-04-29 DIAGNOSIS — E782 Mixed hyperlipidemia: Secondary | ICD-10-CM

## 2023-04-29 DIAGNOSIS — R079 Chest pain, unspecified: Secondary | ICD-10-CM

## 2023-04-29 DIAGNOSIS — G4730 Sleep apnea, unspecified: Secondary | ICD-10-CM

## 2023-04-29 DIAGNOSIS — I10 Essential (primary) hypertension: Secondary | ICD-10-CM

## 2023-04-29 DIAGNOSIS — R000 Tachycardia, unspecified: Secondary | ICD-10-CM

## 2023-04-29 DIAGNOSIS — R0600 Dyspnea, unspecified: Secondary | ICD-10-CM

## 2023-04-29 NOTE — Progress Notes (Signed)
BH MD/PA/NP OP Progress Note  05/03/2023 10:30 AM Katherine [name]  MRN:  [id number]  Chief Complaint:  Chief Complaint  Patient presents with   Follow-up   HPI:  This is a follow-up appointment for delusion and anxiety.  She states that she has been in and out of sleep since being on trazodone.  She feels hazy, and she has worsening headaches only she takes trazodone.  She states that she has been feeling anxious all day.  She has never had this anxiety.  She is avoid all through the night.  She states that she feels something bad is going to happen to her .  She is also concerned that her cell phone disappeared.  Although she uses her husband's phone, numbers are gone.  She does not know how to delete those, and believes something is wrong with his phone.  She believes someone is tapping the phone. He received not solicited pornography every 20 mins.  She believes somebody is trying to hurt her feeling.  She cannot get rid of these stress.  She also reports concern of receiving anonymous threatening message since 2019. She sew flying insects in the security system, which was installed in Dec 2023. She believes that they remain in the house, and she has been attacked, bitten every night. They are arranging termite for this. She is concerned about her dog, who is 81 year old. She takes care of the dog every day, and she feels burdened by this. She denies SI, HI. She denies alcohol use, drug use. Of note, she reports that she does not want her son to be worried about her condition (she signed ROI to speak with him at her previous visit). She would like her husband to be the primary contact person.    Katherine [name], her husband presents to the visit.  He states that she is doing well when she is not in the house. She is more like her old self when they are out, such as being in [place]. He agreed with the plan as outlined below.    Orientation- oriented x 4   Wt Readings from Last 3 Encounters:   05/03/23 146 lb 12.8 oz (66.6 kg)  04/21/23 152 lb 3.2 oz (69 kg)  04/12/23 151 lb 2 oz (68.5 kg)     Visit Diagnosis:    ICD-10-CM   1. Delusion (HCC)  F22 Urinalysis    2. Neurocognitive disorder  R41.9     3. Anxiety disorder, unspecified type  F41.9       Past Psychiatric History: Please see initial evaluation for full details. I have reviewed the history. No updates at this time.     Past Medical History:  Past Medical History:  Diagnosis Date   Anemia 2018   Anxiety    Arthritis    Borderline diabetes    Cataract    Chest pain    a. 2007 Cath: nl cors.  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 03/2022 MV: EF >65%, no ischemia/infarct. No signif cor/Ao Ca2+. Low risk.   Chicken pox    age 45   Chronic back pain    Compressed discs. [facility]   Depression    Diastolic dysfunction    a. 06/2020 Echo: EF 60-65%, no rwma, GrI DD, nl RV size/fxn. RVSP 30.5 mmHg.   GERD (gastroesophageal reflux disease)    High cholesterol    Hypertension    Hypokalemia  Migraine headache    Obesity    Parkinson's disease (HCC)    Pre-diabetes    okay now    Sinus tachycardia    a. 02/2022 Zio: predominantly sinus rhythm, avg of 99 bpm (77-134). Rare PACs/PVCs. Triggered events = sinus rhythm.    Past Surgical History:  Procedure Laterality Date   BLADDER SUSPENSION  2005   CARDIAC CATHETERIZATION     CESAREAN SECTION     CLOSED REDUCTION NASAL FRACTURE N/A 01/11/2020   Procedure: CLOSED REDUCTION NASAL FRACTURE;  Surgeon: Linus Salmons, MD;  Location: [facility];  Service: ENT;  Laterality: N/A;   ENDOMETRIAL ABLATION  2007   FOOT SURGERY     bilateral bunions   LUMBAR LAMINECTOMY/DECOMPRESSION MICRODISCECTOMY Bilateral 01/12/2016   Procedure: Bilateral L4-5 Laminotomy/Foraminotomy;  Surgeon: Tressie Stalker, MD;  Location: [facility];  Service: Neurosurgery;  Laterality: Bilateral;   Bilateral L4-5 Laminotomy/Foraminotomy   TONSILLECTOMY  1971    Family Psychiatric History: Please see initial evaluation for full details. I have reviewed the history. No updates at this time.    Family History:  Family History  Problem Relation Age of Onset   Depression Mother    Heart disease Mother    Hypertension Mother    Heart failure Mother        CHF   Diabetes Mother    Cancer Mother        CERVICAL CANCER   Atrial fibrillation Mother    Bipolar disorder Father    Heart disease Father    Diabetes Father    Rashes / Skin problems Father    Hypertension Father    Cirrhosis Father        liver disease non alcoholic   Bipolar disorder Sister    Diabetes Sister    Breast cancer Sister 37   Diabetes Mellitus II Sister    Heart disease Brother    Heart failure Brother        CHF   Diabetes Brother    Cancer Brother 17       THROAT AND NECK   Esophageal cancer Brother    Lung cancer Brother    Atrial fibrillation Brother    Cancer Maternal Grandfather    Colon cancer Neg Hx    Stomach cancer Neg Hx    Rectal cancer Neg Hx     Social History:  Social History   Socioeconomic History   Marital status: Married    Spouse name: Not on file   Number of children: 1   Years of education: Not on file   Highest education level: Not on file  Occupational History   Occupation: Magazine features editor: [employer]    Comment: retired-special needs teacher  Tobacco Use   Smoking status: Never   Smokeless tobacco: Never  Vaping Use   Vaping status: Never Used  Substance and Sexual Activity   Alcohol use: No   Drug use: No   Sexual activity: Yes    Partners: Male    Birth control/protection: Post-menopausal  Other Topics Concern   Not on file  Social History Narrative   Married.   One son is 56, lives in [place].   Retired from Agricultural consultant.   Enjoys substitute teaching, gardening, cooking.   Social Determinants of Health   Financial Resource  Strain: Low Risk  (07/30/2022)   Overall Financial Resource Strain (CARDIA)    Difficulty of Paying Living Expenses: Not hard at all  Food Insecurity: No  Food Insecurity (07/30/2022)   Hunger Vital Sign    Worried About Running Out of Food in the Last Year: Never true    Ran Out of Food in the Last Year: Never true  Transportation Needs: No Transportation Needs (07/30/2022)   PRAPARE - Administrator, Civil Service (Medical): No    Lack of Transportation (Non-Medical): No  Physical Activity: Unknown (07/30/2022)   Exercise Vital Sign    Days of Exercise per Week: 0 days    Minutes of Exercise per Session: Not on file  Stress: Stress Concern Present (07/30/2022)   [employer] of Occupational Health - Occupational Stress Questionnaire    Feeling of Stress : To some extent  Social Connections: Unknown (07/30/2022)   Social Connection and Isolation Panel [NHANES]    Frequency of Communication with Friends and Family: Not on file    Frequency of Social Gatherings with Friends and Family: Not on file    Attends Religious Services: Not on file    Active Member of Clubs or Organizations: Not on file    Attends Banker Meetings: Not on file    Marital Status: Married    Allergies:  Allergies  Allergen Reactions   Chlorine Other (See Comments)    Smell causes lightheadedness.   Dilaudid [Hydromorphone]     hallucinations   Codeine Anxiety   Darvon Anxiety   Hydrocodone Anxiety   Meperidine Anxiety   Oxycodone Anxiety   Tramadol Anxiety   Victoza [Liraglutide] Nausea Only and Other (See Comments)    And weakness    Metabolic Disorder Labs: Lab Results  Component Value Date   HGBA1C 5.9 (H) 07/28/2022   MPG 120 01/09/2016   MPG 131 (H) 09/10/2014   No results found for: "PROLACTIN" Lab Results  Component Value Date   CHOL 211 (H) 03/10/2021   TRIG 266.0 (H) 03/10/2021   HDL 57.40 03/10/2021   CHOLHDL 4 03/10/2021   VLDL 53.2 (H) 03/10/2021    LDLCALC 60 04/15/2020   LDLCALC 118 (H) 07/09/2019   Lab Results  Component Value Date   TSH 1.180 12/02/2021   TSH 3.33 10/07/2020    Therapeutic Level Labs: No results found for: "LITHIUM" No results found for: "VALPROATE" No results found for: "CBMZ"  Current Medications: Current Outpatient Medications  Medication Sig Dispense Refill   DULoxetine (CYMBALTA) 30 MG capsule Take 30 mg by mouth daily.     DULoxetine (CYMBALTA) 60 MG capsule Take 60 mg by mouth every morning. Takes with her 30mg  capsule to equal 90 mg daily     famotidine (PEPCID) 20 MG tablet TAKE 1 TABLET (20 MG TOTAL) BY MOUTH DAILY. FOR HEARTBURN. 90 tablet 3   Multiple Vitamin (MULTIVITAMIN) tablet Take 1 tablet by mouth daily.     NUPLAZID 34 MG CAPS Take 1 capsule by mouth daily.     OLANZapine (ZYPREXA) 2.5 MG tablet Take 1 tablet (2.5 mg total) by mouth at bedtime. 30 tablet 1   pantoprazole (PROTONIX) 40 MG tablet TAKE 1 TABLET (40 MG TOTAL) BY MOUTH 2 (TWO) TIMES DAILY BEFORE A MEAL. FOR HEARTBURN. 180 tablet 1   propranolol (INDERAL) 20 MG tablet TAKE 1 TABLET (20 MG TOTAL) BY MOUTH 3 (THREE) TIMES DAILY AS NEEDED. FOR HEARTRATE >110 BPM 90 tablet 2   spironolactone (ALDACTONE) 25 MG tablet TAKE 1 TABLET (25 MG TOTAL) BY MOUTH DAILY. PLEASE SCHEDULE APPOINTMENT FOR FURTHER REFILLS 30 tablet 0   SUMAtriptan (IMITREX) 100 MG tablet Take 1  tablet by mouth at migraine onset. Do not exceed 100 mg in 24 hours. 10 tablet 0   tamsulosin (FLOMAX) 0.4 MG CAPS capsule Take 0.4 mg by mouth.     topiramate (TOPAMAX) 50 MG tablet Take 1 tablet by mouth every morning and 2 tablets at night for migraine prevention. 270 tablet 1   No current facility-administered medications for this visit.     Musculoskeletal: Strength & Muscle Tone: within normal limits Gait & Station: normal Patient leans: N/A  Psychiatric Specialty Exam: Review of Systems  Psychiatric/Behavioral:  Positive for dysphoric mood, hallucinations and  sleep disturbance. Negative for agitation, behavioral problems, confusion, decreased concentration, self-injury and suicidal ideas. The patient is nervous/anxious. The patient is not hyperactive.   All other systems reviewed and are negative.   Blood pressure 118/80, pulse (!) 121, temperature (!) 97.1 F (36.2 C), temperature source Skin, height 5\' 2"  (1.575 m), weight 146 lb 12.8 oz (66.6 kg).Body mass index is 26.85 kg/m.  General Appearance: Well Groomed  Eye Contact:  Good  Speech:  Clear and Coherent, slightly pressured  Volume:  Normal  Mood:  Anxious and Depressed  Affect:  Appropriate, Congruent, and slightly tense  Thought Process:  Coherent and Descriptions of Associations: Tangential  Orientation:  Full (Time, Place, and Person)  Thought Content: Logical   Suicidal Thoughts:  No  Homicidal Thoughts:  No  Memory:  Immediate;   Good  Judgement:  Good  Insight:  Lacking  Psychomotor Activity:  Normal, Normal tone, no rigidity, no resting/postural tremors, no tardive dyskinesia    Concentration:  Concentration: Good and Attention Span: Good  Recall:  NA  Fund of Knowledge: Good  Language: Good  Akathisia:  No  Handed:  Right  AIMS (if indicated): not done  Assets:  Social Support  ADL's:  Intact  Cognition: WNL  Sleep:  Poor   Screenings: GAD-7    Garment/textile technologist Visit from 04/21/2023 in [facility] Office Visit from 03/22/2023 in [facility] [place] Office Visit from 02/08/2023 in [facility] [place] Office Visit from 09/20/2022 in [facility]  Total GAD-7 Score 13 9 6 8       Mini-Mental    Flowsheet Row Clinical Support from 07/06/2018 in [facility] [place] [facility]  Total Score (max 30 points ) 20      PHQ2-9    Flowsheet Row Office Visit from 04/21/2023 in [facility]  Office Visit from 04/12/2023 in [facility] Office Visit from 03/22/2023 in [facility] [place] [facility] Office Visit from 02/08/2023 in [facility] [place] [facility] Office Visit from 09/20/2022 in [facility] [place]  PHQ-2 Total Score 2 2 2 3 2   PHQ-9 Total Score 11 9 8 9 5       Flowsheet Row ED from 12/02/2021 in [facility] ED from 05/11/2021 in [facility]  ED from 02/27/2021 in [facility]   C-SSRS RISK CATEGORY No Risk No Risk No Risk        Assessment and Plan:  Katherine [name] is a 72 y.o. year old female with a history of depression, anxiety, mild cognitive impairment, who is referred for anxiety and depression.   1. Delusion (HCC) 2. Neurocognitive disorder Functional  Status   IADL: Independent in the following: managing finances, medications, driving           Requires assistance with the following:  ADL  Independent in the following: bathing and hygiene, feeding, continence, grooming and toileting, walking          Requires assistance with the following: Folate, Vitamin B12, TSH Images  PET CT brain metabolic evaluation showed hypometabolism involving both cerebral hemispheres, basal ganglia and brainstem, MRI of the brain mild generalized parenchymal volume loss, without any particular lobar predominance, mild small vessel disease Neuropsych assessment:  Etiology:   Exam is notable for rumination on delusion, slightly pressured speech, although she is calm, redirectable, and oriented otherwise. The differential diagnosis includes delusion secondary to neurocognitive disorder, medication-induced mania (as she was recently on mirtazapine), and bipolar disorder (she has a family history of bipolar), although the onset is not typical.   She had adverse reaction from quetiapine, trazodone, and  would not be a good candidate at this time for clozapine as it requires frequent blood monitoring. Will start olanzapine to target delusion, insomnia and appetite loss.  Although it is not preferable to use along with nuplazid, she is already on maximum dose of this medication, and it has limited benefit.  Will consider monotherapy if she has good benefit from olanzapine.  This potential risk of QTc prolongation, EPS/parkinsonism, increased mortality with people with dementia.  Will obtain lab to rule out any medical health issues contributing to her symptoms. (She would like urinalysis to be obtained- will order this to rule out delirium.) She will keep her appointment with neurologist for further evaluation.   3. Anxiety disorder, unspecified type Unchanged. She reports anxiety for many years, which has been significantly worsening since March due to issues with her husband.  Will intervene as outlined above.  Noted that she has chest pain and she is undergoing evaluation with her PCP.     # weight loss According to the chart review, she was seen by a gastroenterologist in 2023. "Last colonoscopy October 2018 with finding of a 9 mm polyp in the ascending colon which was a tubular adenoma and noted multiple diverticuli." She had EGD with dilation in Sept 2023, although the detailed reports are not available.  Although mood symptoms can suddenly close weight loss, we will examine also out any medical health issues contributing to it.   Plan Continue duloxetine 90 mg daily  Start olanzapine 2.5 mg at night Hold Trazodone Obtain lab (TSH, folate), urine drug screening Next appointment: 11/21 at 8:30, IP  Obtain ROI for 2 way conversation with her son, Lawrence Santiago [phone]  - on nuplazid 34 mg daily   Past trials: quetiapine- nightmares, trazodone- headache  The patient demonstrates the following risk factors for suicide: Chronic risk factors for suicide include: psychiatric disorder of  depression . Acute risk factors for suicide include: family or marital conflict and unemployment. Protective factors for this patient include: positive social support and hope for the future. Considering these factors, the overall suicide risk at this point appears to be low. Patient is appropriate for outpatient follow up.     Collaboration of Care: Collaboration of Care: Other reviewed notes in Epic  Patient/Guardian was advised Release of Information must be obtained prior to any record release in order to collaborate their care with an outside provider. Patient/Guardian was advised if they have not already done so to contact the registration department to sign all necessary forms in order for [place] to release  information regarding their care.   Consent: Patient/Guardian gives verbal consent for treatment and assignment of benefits for services provided during this visit. Patient/Guardian expressed understanding and agreed to proceed.   The duration of the time spent on the following activities on the date of the encounter was 40 minutes.   Preparing to see the patient (e.g., review of test, records)  Obtaining and/or reviewing separately obtained history  Performing a medically necessary exam and/or evaluation  Counseling and educating the patient/family/caregiver  Ordering medications, tests, or procedures  Referring and communicating with other healthcare professionals (when not reported separately)  Documenting clinical information in the electronic or paper health record  Independently interpreting results of tests/labs and communication of results to the family or caregiver  Care coordination (when not reported separately)   Neysa Hotter, MD 05/03/2023, 10:30 AM

## 2023-05-02 NOTE — Progress Notes (Unsigned)
Date:  05/03/2023   ID:  Katherine [name], Katherine [name] 1950-12-09, MRN [id number]  Patient Location:  [address] [place] [address]   Provider location:   [facility], [place] office  PCP:  Doreene Nest, NP  Cardiologist:  [facility]  No chief complaint on file.   History of Present Illness:    Katherine [name] is a 72 y.o. female  past medical history of CP with no CAD on cath 2007,  HTN  Morbid obesity Chronic back pain, prior back surgery Possible sleep apnea by history Parkinson's , seen at [facility] clinic Jan 2023, frontotemporal dementia (FTD) vs Lewy Body Dementia.  She returns today for routine follow-up Of her blood pressure, history of hypokalemia  Last seen in clinic by myself 8/23 Followed by psychiatry, had evaluation today Losing weight Trouble sleeping, provided prescription for olanzapine, she will start tonight  Nothing appeals when looking for food to eat Husband does the cooking Has been missing meals Feels her weight is down 50 pounds Review of records indicates weight down 40 pounds since January 2022 189 pounds down to 148 pounds today  No significant chest pain or shortness of breath Reports she has not gone outside the house very much over the past year Has not gone shopping  Other past medical history reviewed Seen in the hospital Dec 02, 2021 with near syncope dizziness and a near-fall.  Patient also reports some nausea without vomiting following the incident had not eaten , skipped breakfast , was rushing through the airport to catch a flight that was ultimately delayed.  Patient began to experience some near syncopal episodes, was able to lower herself to the ground holding onto a chair.  She has had previous episodes of similar incidences  Previously on simvastatin and carvedilol  Previously reported having hallucinations at times On last clinic visit, thinking of hiring a private detective to monitor  her husband who she feels may be having an affair, debit card keeps going missing, money missing from her account, jewelry missing, feels someone is coming into her house and taking her belongings On NUPLAZID  Followed by neurology  Echo   1. Left ventricular ejection fraction, by estimation, is 60 to 65%. The  left ventricle has normal function. The left ventricle has no regional  wall motion abnormalities. Left ventricular diastolic parameters are  consistent with Grade I diastolic  dysfunction (impaired relaxation).   2. Right ventricular systolic function is normal. The right ventricular  size is normal. There is normal pulmonary artery systolic pressure. The  estimated right ventricular systolic pressure is 30.5 mmHg.    Past Medical History:  Diagnosis Date   Anemia 2018   Anxiety    Arthritis    Borderline diabetes    Cataract    Chest pain    a. 2007 Cath: nl cors.  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 03/2022 MV: EF >65%, no ischemia/infarct. No signif cor/Ao Ca2+. Low risk.   Chicken pox    age 65   Chronic back pain    Compressed discs. [facility]   Depression    Diastolic dysfunction    a. 06/2020 Echo: EF 60-65%, no rwma, GrI DD, nl RV size/fxn. RVSP 30.5 mmHg.   GERD (gastroesophageal reflux disease)    High cholesterol    Hypertension    Hypokalemia    Migraine headache    Obesity  Parkinson's disease (HCC)    Pre-diabetes    okay now    Sinus tachycardia    a. 02/2022 Zio: predominantly sinus rhythm, avg of 99 bpm (77-134). Rare PACs/PVCs. Triggered events = sinus rhythm.   Past Surgical History:  Procedure Laterality Date   BLADDER SUSPENSION  2005   CARDIAC CATHETERIZATION     CESAREAN SECTION     CLOSED REDUCTION NASAL FRACTURE N/A 01/11/2020   Procedure: CLOSED REDUCTION NASAL FRACTURE;  Surgeon: Linus Salmons, MD;  [facility]: [facility];  Service: ENT;  Laterality:  N/A;   ENDOMETRIAL ABLATION  2007   FOOT SURGERY     bilateral bunions   LUMBAR LAMINECTOMY/DECOMPRESSION MICRODISCECTOMY Bilateral 01/12/2016   Procedure: Bilateral L4-5 Laminotomy/Foraminotomy;  Surgeon: Tressie Stalker, MD;  Location: [facility];  Service: Neurosurgery;  Laterality: Bilateral;  Bilateral L4-5 Laminotomy/Foraminotomy   TONSILLECTOMY  1971     Allergies:   Chlorine, Dilaudid [hydromorphone], Codeine, Darvon, Hydrocodone, Meperidine, Oxycodone, Tramadol, and Victoza [liraglutide]   Social History   Tobacco Use   Smoking status: Never   Smokeless tobacco: Never  Vaping Use   Vaping status: Never Used  Substance Use Topics   Alcohol use: No   Drug use: No     Current Outpatient Medications on File Prior to Visit  Medication Sig Dispense Refill   doxycycline (PERIOSTAT) 20 MG tablet Take 20 mg by mouth 2 (two) times daily.     DULoxetine (CYMBALTA) 30 MG capsule Take 30 mg by mouth daily.     DULoxetine (CYMBALTA) 60 MG capsule Take 60 mg by mouth every morning. Takes with her 30mg  capsule to equal 90 mg daily     famotidine (PEPCID) 20 MG tablet TAKE 1 TABLET (20 MG TOTAL) BY MOUTH DAILY. FOR HEARTBURN. 90 tablet 3   Multiple Vitamin (MULTIVITAMIN) tablet Take 1 tablet by mouth daily.     NUPLAZID 34 MG CAPS Take 1 capsule by mouth daily.     OLANZapine (ZYPREXA) 2.5 MG tablet Take 1 tablet (2.5 mg total) by mouth at bedtime. 30 tablet 1   pantoprazole (PROTONIX) 40 MG tablet TAKE 1 TABLET (40 MG TOTAL) BY MOUTH 2 (TWO) TIMES DAILY BEFORE A MEAL. FOR HEARTBURN. 180 tablet 1   propranolol (INDERAL) 20 MG tablet TAKE 1 TABLET (20 MG TOTAL) BY MOUTH 3 (THREE) TIMES DAILY AS NEEDED. FOR HEARTRATE >110 BPM 90 tablet 2   spironolactone (ALDACTONE) 25 MG tablet TAKE 1 TABLET (25 MG TOTAL) BY MOUTH DAILY. PLEASE SCHEDULE APPOINTMENT FOR FURTHER REFILLS 30 tablet 0   SUMAtriptan (IMITREX) 100 MG tablet Take 1 tablet by mouth at migraine onset. Do not exceed 100 mg in 24  hours. 10 tablet 0   tamsulosin (FLOMAX) 0.4 MG CAPS capsule Take 0.4 mg by mouth.     topiramate (TOPAMAX) 50 MG tablet Take 1 tablet by mouth every morning and 2 tablets at night for migraine prevention. 270 tablet 1   No current facility-administered medications on file prior to visit.     Family Hx: The patient's family history includes Atrial fibrillation in her brother and mother; Bipolar disorder in her father and sister; Breast cancer (age of onset: 45) in her sister; Cancer in her maternal grandfather and mother; Cancer (age of onset: 59) in her brother; Cirrhosis in her father; Depression in her mother; Diabetes in her brother, father, mother, and sister; Diabetes Mellitus II in her sister; Esophageal cancer in her brother; Heart disease in her brother, father, and mother; Heart failure  in her brother and mother; Hypertension in her father and mother; Lung cancer in her brother; Rashes / Skin problems in her father. There is no history of Colon cancer, Stomach cancer, or Rectal cancer.  ROS:   Please see the history of present illness.    Review of Systems  Constitutional: Negative.   HENT: Negative.    Respiratory: Negative.    Cardiovascular: Negative.   Gastrointestinal: Negative.   Musculoskeletal: Negative.   Neurological: Negative.   Psychiatric/Behavioral: Negative.    All other systems reviewed and are negative.    Labs/Other Tests and Data Reviewed:    Recent Labs: 10/20/2022: ALT 13 03/22/2023: BUN 14; Creatinine, Ser 0.80; Hemoglobin 15.1; Platelets 313.0; Potassium 4.0; Sodium 143   Recent Lipid Panel Lab Results  Component Value Date/Time   CHOL 211 (H) 03/10/2021 08:45 AM   CHOL 123 06/22/2013 08:09 AM   TRIG 266.0 (H) 03/10/2021 08:45 AM   HDL 57.40 03/10/2021 08:45 AM   HDL 48 06/22/2013 08:09 AM   CHOLHDL 4 03/10/2021 08:45 AM   LDLCALC 60 04/15/2020 12:41 PM   LDLCALC 60 06/22/2013 08:09 AM   LDLDIRECT 143.0 03/10/2021 08:45 AM    Wt Readings from  Last 3 Encounters:  05/03/23 148 lb (67.1 kg)  04/12/23 151 lb 2 oz (68.5 kg)  03/31/23 152 lb (68.9 kg)     Exam:    BP 110/70   Pulse 83   Ht 5\' 2"  (1.575 m)   Wt 148 lb (67.1 kg)   SpO2 98%   BMI 27.07 kg/m  Constitutional:  oriented to person, place, and time. No distress.  HENT:  Head: Grossly normal Eyes:  no discharge. No scleral icterus.  Neck: No JVD, no carotid bruits  Cardiovascular: Regular rate and rhythm, no murmurs appreciated Pulmonary/Chest: Clear to auscultation bilaterally, no wheezes or rails Abdominal: Soft.  no distension.  no tenderness.  Musculoskeletal: Normal range of motion Neurological:  normal muscle tone. Coordination normal. No atrophy Skin: Skin warm and dry Psychiatric: normal affect, pleasant  ASSESSMENT & PLAN:    Problem List Items Addressed This Visit       Cardiology Problems   Hyperlipidemia     Other   GAD (generalized anxiety disorder)   Lewy body dementia (HCC)   Other Visit Diagnoses     Chest pain of uncertain etiology    -  Primary   Tachycardia       Essential hypertension       Dyspnea, unspecified type       Sleep-disordered breathing          Dizziness Chronic issue, Carvedilol previously held Recommend she hydrate in the mornings Blood pressure lower with 40 pound weight loss Stressed the need to stabilize weight loss  Falls No regular exercise program, gait instability  Tachycardia Used to be on coreg Takes propranolol as needed, likely exacerbated by anxiety  Parkinson's/frontotemporal dementia Followed by neurology, seen at [facility] clinic Followed by psychiatry    Signed, Julien Nordmann, MD  [facility] [address], [place], [place] [address]

## 2023-05-03 ENCOUNTER — Ambulatory Visit: Payer: Medicare Other | Attending: Nurse Practitioner | Admitting: Cardiovascular Disease

## 2023-05-03 ENCOUNTER — Ambulatory Visit: Payer: Medicare Other | Admitting: Psychiatry

## 2023-05-03 ENCOUNTER — Encounter: Payer: Self-pay | Admitting: Psychiatry

## 2023-05-03 ENCOUNTER — Other Ambulatory Visit: Payer: Self-pay | Admitting: Cardiovascular Disease

## 2023-05-03 ENCOUNTER — Encounter: Payer: Self-pay | Admitting: Cardiovascular Disease

## 2023-05-03 VITALS — BP 118/80 | HR 121 | Temp 97.1°F | Ht 62.0 in | Wt 146.8 lb

## 2023-05-03 VITALS — BP 110/70 | HR 83 | Ht 62.0 in | Wt 148.0 lb

## 2023-05-03 DIAGNOSIS — R419 Unspecified symptoms and signs involving cognitive functions and awareness: Secondary | ICD-10-CM | POA: Diagnosis not present

## 2023-05-03 DIAGNOSIS — R0600 Dyspnea, unspecified: Secondary | ICD-10-CM | POA: Diagnosis not present

## 2023-05-03 DIAGNOSIS — E782 Mixed hyperlipidemia: Secondary | ICD-10-CM

## 2023-05-03 DIAGNOSIS — R000 Tachycardia, unspecified: Secondary | ICD-10-CM

## 2023-05-03 DIAGNOSIS — F02818 Dementia in other diseases classified elsewhere, unspecified severity, with other behavioral disturbance: Secondary | ICD-10-CM | POA: Insufficient documentation

## 2023-05-03 DIAGNOSIS — G4730 Sleep apnea, unspecified: Secondary | ICD-10-CM | POA: Diagnosis not present

## 2023-05-03 DIAGNOSIS — I10 Essential (primary) hypertension: Secondary | ICD-10-CM

## 2023-05-03 DIAGNOSIS — R079 Chest pain, unspecified: Secondary | ICD-10-CM

## 2023-05-03 DIAGNOSIS — F419 Anxiety disorder, unspecified: Secondary | ICD-10-CM | POA: Diagnosis not present

## 2023-05-03 DIAGNOSIS — F22 Delusional disorders: Secondary | ICD-10-CM

## 2023-05-03 DIAGNOSIS — G3183 Dementia with Lewy bodies: Secondary | ICD-10-CM

## 2023-05-03 DIAGNOSIS — F411 Generalized anxiety disorder: Secondary | ICD-10-CM | POA: Diagnosis not present

## 2023-05-03 MED ORDER — OLANZAPINE 2.5 MG PO TABS: 2.5000 mg | ORAL_TABLET | Freq: Every day | ORAL | 1 refills | Status: DC

## 2023-05-03 NOTE — Patient Instructions (Signed)
Continue duloxetine 90 mg daily  Start olanzapine 2.5 mg at night Hold Trazodone Obtain lab (TSH, folate), urine drug screening Next appointment: 11/21 at 8:30

## 2023-05-03 NOTE — Patient Instructions (Signed)

## 2023-05-04 ENCOUNTER — Ambulatory Visit: Payer: Medicare Other | Admitting: Neurology

## 2023-05-04 ENCOUNTER — Ambulatory Visit: Payer: Medicare Other | Admitting: Primary Care

## 2023-05-04 ENCOUNTER — Encounter: Payer: Self-pay | Admitting: Neurology

## 2023-05-04 VITALS — BP 100/64 | HR 67 | Ht 62.0 in | Wt 148.0 lb

## 2023-05-04 DIAGNOSIS — G3184 Mild cognitive impairment, so stated: Secondary | ICD-10-CM | POA: Diagnosis not present

## 2023-05-04 DIAGNOSIS — F418 Other specified anxiety disorders: Secondary | ICD-10-CM | POA: Diagnosis not present

## 2023-05-04 DIAGNOSIS — E538 Deficiency of other specified B group vitamins: Secondary | ICD-10-CM
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
## 2023-05-05 ENCOUNTER — Telehealth: Payer: Self-pay | Admitting: Neurology

## 2023-05-05 ENCOUNTER — Ambulatory Visit: Payer: Medicare Other | Admitting: Primary Care

## 2023-05-05 DIAGNOSIS — R3911 Hesitancy of micturition: Secondary | ICD-10-CM | POA: Diagnosis not present

## 2023-05-05 NOTE — Telephone Encounter (Signed)
medicare/[place] sup NPR sent to GI [phone]

## 2023-05-06 ENCOUNTER — Telehealth: Payer: Self-pay

## 2023-05-06 NOTE — Telephone Encounter (Signed)
Please advise her to discontinue olanzapine. Ask if she would be interested in starting another medication, Rexulti, AFTER her symptoms resolve. The starting dose would be 0.5 mg daily. The side effect profile is quite similar to olanzapine, but Rexulti is less sedating and is less likely to increase appetite.

## 2023-05-06 NOTE — Telephone Encounter (Signed)
Called to inform patient of message no answer left voicemail for patient to return call to office and also reminded patient that the office will close today at noon

## 2023-05-06 NOTE — Telephone Encounter (Signed)
Patient called stating that as soon as she takes the Olanzapine she is experiencing a tingling needle pricking feeling in her feet, fingers and tongue she started the medication on Tuesday and she is taking it at night at bedtime please advise

## 2023-05-06 NOTE — Telephone Encounter (Signed)
I also left her a voicemail advising to discontinue the medication. I instructed her to contact urgent care or go to the emergency room if there is any worsening of her symptoms despite the discontinuation.  I advised her to contact the office on Monday for further guidance.

## 2023-05-07 ENCOUNTER — Other Ambulatory Visit: Payer: Self-pay | Admitting: Cardiovascular Disease

## 2023-05-07 ENCOUNTER — Other Ambulatory Visit: Payer: Self-pay | Admitting: Primary Care

## 2023-05-07 DIAGNOSIS — R079 Chest pain, unspecified: Secondary | ICD-10-CM

## 2023-05-07 DIAGNOSIS — I10 Essential (primary) hypertension: Secondary | ICD-10-CM

## 2023-05-07 DIAGNOSIS — F411 Generalized anxiety disorder: Secondary | ICD-10-CM

## 2023-05-09 ENCOUNTER — Other Ambulatory Visit: Payer: Medicare Other

## 2023-05-09 ENCOUNTER — Telehealth: Payer: Self-pay | Admitting: Neurology

## 2023-05-09 NOTE — Telephone Encounter (Signed)
Spoke to patient she stated that she did discontinue the olanzapine and she is not taking anything right now other than Excedrin PM and she is not experiencing the symptoms any longer

## 2023-05-09 NOTE — Telephone Encounter (Signed)
Thank you! Please let me know if she is interested in starting Rexulti at 0.5 mg daily, so I can send the order to the pharmacy.

## 2023-05-09 NOTE — Telephone Encounter (Signed)
Called patient as instructed by provider to ask about rexulti 0.5 mg she stated that she had tried that before and it did not agree with her and she didn't want to try it again

## 2023-05-09 NOTE — Telephone Encounter (Signed)
Could you ask her if she is sure about trying Rexulti, or if she meant Abilify? I just want to clarify, as I couldn't find any record of her having tried Rexulti in the chart. Thanks.

## 2023-05-09 NOTE — Telephone Encounter (Signed)
Pt is asking if Dr Terrace Arabia will call [facility](Patient Registration) at [phone] this is a location in [place], Pt would like them called to see if they can be the location where she has her EEG.

## 2023-05-10 ENCOUNTER — Other Ambulatory Visit: Payer: Self-pay | Admitting: Psychiatry

## 2023-05-10 MED ORDER — CARIPRAZINE HCL 1.5 MG PO CAPS: 1.5000 mg | ORAL_CAPSULE | Freq: Every day | ORAL | 0 refills | Status: AC

## 2023-05-10 NOTE — Telephone Encounter (Signed)
Patient stated that she had tried rexulti years ago and it did not agree with her

## 2023-05-10 NOTE — Telephone Encounter (Signed)
Ordered. Vraylar. Please advise her to try this medication, and contact our office if any concern.   (Note)  Past Trials: Abilify, Rexulti, olanzapine (caused tingling), quetiapine. I would avoid trying clozapine due to the potential side effects experienced with olanzapine. Although she might benefit from [facility], it is cost-prohibitive.

## 2023-05-10 NOTE — Telephone Encounter (Signed)
Called patient as instructed by provider no answer left voicemail for patient to return call to office

## 2023-05-10 NOTE — Telephone Encounter (Signed)
pt states that she did not want to take anything that would decrease her apptite so if that medication will not decrease her apptitie she will try.

## 2023-05-10 NOTE — Telephone Encounter (Signed)
I prefer her to have EEG at our office, if she has trouble making it, will cancel EEG order. She can call back when she is ready for it.  An electroencephalogram (EEG) is a test that measures electrical activity in the brain. This test also is called an EEG. The test uses small, metal discs called electrodes that attach to the scalp. Brain cells communicate via electrical impulses, and this activity shows up as wavy lines on an EEG recording. Brain cells are active all the time, even during sleep.  An EEG is one of the main tests to help diagnose epilepsy. An EEG also can play a role in diagnosing other brain conditions.    Brain damage from a head injury. Brain disease that can have a variety of causes, known as encephalopathy. Inflammation of the brain, such as herpes encephalitis.

## 2023-05-10 NOTE — Telephone Encounter (Signed)
Phone room please call pt and state:  Dr. Terrace Arabia said: I prefer her to have EEG at our office, if she has trouble making it, will cancel EEG order. She can call back when she is ready for it.  Thanks,  Production assistant, radio

## 2023-05-10 NOTE — Telephone Encounter (Signed)
Left the voice message to contact the office.  When she calls back, please ask if she would like to try Vraylar, another antipsychotics.  The possible side effects are similar to those of olanzapine, but it is less sedating and doesn't typically cause significant appetite issues. Let me know if she has any additional questions.

## 2023-05-11 ENCOUNTER — Ambulatory Visit: Payer: Medicare Other

## 2023-05-11 NOTE — Telephone Encounter (Signed)
spoke with patient gave her the directions per dr. Vanetta Shawl order but patient states she does not want to take the medication becuase she has looked it up and seen the advertisement and she doesn;t want to take. I did send a message to tina (front desk) to see if patient can be seen sooner to discuss.

## 2023-05-12 DIAGNOSIS — N3020 Other chronic cystitis without hematuria: Secondary | ICD-10-CM | POA: Diagnosis not present

## 2023-05-19 ENCOUNTER — Ambulatory Visit: Payer: Medicare Other

## 2023-05-19 ENCOUNTER — Ambulatory Visit: Payer: Medicare Other | Admitting: Psychiatry

## 2023-05-21 ENCOUNTER — Other Ambulatory Visit: Payer: Medicare Other

## 2023-05-24 ENCOUNTER — Ambulatory Visit: Payer: Medicare Other | Admitting: Cardiovascular Disease

## 2023-05-25 ENCOUNTER — Ambulatory Visit: Payer: Medicare Other

## 2023-05-25 ENCOUNTER — Other Ambulatory Visit: Payer: Self-pay | Admitting: Primary Care

## 2023-05-25 ENCOUNTER — Ambulatory Visit (INDEPENDENT_AMBULATORY_CARE_PROVIDER_SITE_OTHER): Payer: Medicare Other

## 2023-05-25 DIAGNOSIS — E538 Deficiency of other specified B group vitamins: Secondary | ICD-10-CM | POA: Diagnosis not present

## 2023-05-25 DIAGNOSIS — G43709 Chronic migraine without aura, not intractable, without status migrainosus: Secondary | ICD-10-CM

## 2023-05-25 MED ORDER — CYANOCOBALAMIN 1000 MCG/ML IJ SOLN
1000.0000 ug | Freq: Once | INTRAMUSCULAR | Status: AC
Start: 2023-05-25 — End: 2023-05-25
  Administered 2023-05-25: 1000 ug via INTRAMUSCULAR

## 2023-05-25 MED ORDER — SUMATRIPTAN SUCCINATE 100 MG PO TABS
ORAL_TABLET | ORAL | 0 refills | Status: DC
Start: 2023-05-25 — End: 2023-12-21

## 2023-05-25 NOTE — Progress Notes (Signed)
Per orders of Mayra Reel, NP, injection of #1 of 3 weekly B12 1000 mcg/ml  given by Eual Fines, CMA in left deltoid. Patient tolerated injection well.  We discussed that she did want to do the weekly plan. Today will be #1 of 3. She will not be in town on the 27th, which would be the 4th of 4 weekly ones. I did talk to her about teaching her to do them at home if that is easier for her. She will think about it.

## 2023-05-31 ENCOUNTER — Ambulatory Visit (INDEPENDENT_AMBULATORY_CARE_PROVIDER_SITE_OTHER): Payer: Medicare Other

## 2023-05-31 DIAGNOSIS — E538 Deficiency of other specified B group vitamins: Secondary | ICD-10-CM

## 2023-05-31 DIAGNOSIS — H903 Sensorineural hearing loss, bilateral: Secondary | ICD-10-CM | POA: Diagnosis not present

## 2023-05-31 MED ORDER — CYANOCOBALAMIN 1000 MCG/ML IJ SOLN
1000.0000 ug | Freq: Once | INTRAMUSCULAR | Status: AC
Start: 2023-05-31 — End: 2023-05-31
  Administered 2023-05-31: 1000 ug via INTRAMUSCULAR

## 2023-05-31 NOTE — Progress Notes (Signed)
Per orders of Mayra Reel, NP, #2 of 3 weekly injection of B12 1000 mcg/ml given by Eual Fines, CMA in Right Deltoid.  Patient tolerated injection well.

## 2023-06-01 ENCOUNTER — Ambulatory Visit: Payer: Medicare Other | Admitting: Primary Care

## 2023-06-02 ENCOUNTER — Encounter: Payer: Self-pay | Admitting: Primary Care

## 2023-06-03 ENCOUNTER — Other Ambulatory Visit: Payer: Self-pay

## 2023-06-03 ENCOUNTER — Encounter: Payer: Self-pay | Admitting: Emergency Medicine

## 2023-06-03 ENCOUNTER — Emergency Department: Payer: Medicare Other

## 2023-06-03 DIAGNOSIS — M542 Cervicalgia: Secondary | ICD-10-CM | POA: Diagnosis not present

## 2023-06-03 DIAGNOSIS — M79603 Pain in arm, unspecified: Secondary | ICD-10-CM | POA: Diagnosis not present

## 2023-06-03 DIAGNOSIS — G20A1 Parkinson's disease without dyskinesia, without mention of fluctuations: Secondary | ICD-10-CM | POA: Diagnosis not present

## 2023-06-03 DIAGNOSIS — Y92000 Kitchen of unspecified non-institutional (private) residence as  the place of occurrence of the external cause: Secondary | ICD-10-CM | POA: Diagnosis not present

## 2023-06-03 DIAGNOSIS — D72829 Elevated white blood cell count, unspecified: Secondary | ICD-10-CM | POA: Diagnosis not present

## 2023-06-03 DIAGNOSIS — W01198A Fall on same level from slipping, tripping and stumbling with subsequent striking against other object, initial encounter: Secondary | ICD-10-CM | POA: Diagnosis not present

## 2023-06-03 DIAGNOSIS — M8580 Other specified disorders of bone density and structure, unspecified site: Secondary | ICD-10-CM | POA: Diagnosis not present

## 2023-06-03 DIAGNOSIS — W19XXXA Unspecified fall, initial encounter: Secondary | ICD-10-CM | POA: Diagnosis not present

## 2023-06-03 DIAGNOSIS — S42352A Displaced comminuted fracture of shaft of humerus, left arm, initial encounter for closed fracture: Secondary | ICD-10-CM | POA: Diagnosis not present

## 2023-06-03 DIAGNOSIS — I10 Essential (primary) hypertension: Secondary | ICD-10-CM | POA: Diagnosis not present

## 2023-06-03 DIAGNOSIS — E876 Hypokalemia: Secondary | ICD-10-CM | POA: Insufficient documentation

## 2023-06-03 DIAGNOSIS — S42292A Other displaced fracture of upper end of left humerus, initial encounter for closed fracture: Secondary | ICD-10-CM | POA: Insufficient documentation

## 2023-06-03 DIAGNOSIS — M19012 Primary osteoarthritis, left shoulder: Secondary | ICD-10-CM | POA: Diagnosis not present

## 2023-06-03 DIAGNOSIS — S42202A Unspecified fracture of upper end of left humerus, initial encounter for closed fracture: Secondary | ICD-10-CM | POA: Diagnosis not present

## 2023-06-03 DIAGNOSIS — M25512 Pain in left shoulder: Secondary | ICD-10-CM | POA: Diagnosis present

## 2023-06-03 DIAGNOSIS — S0990XA Unspecified injury of head, initial encounter: Secondary | ICD-10-CM | POA: Diagnosis present

## 2023-06-03 DIAGNOSIS — G44309 Post-traumatic headache, unspecified, not intractable: Secondary | ICD-10-CM | POA: Diagnosis not present

## 2023-06-03 LAB — BASIC METABOLIC PANEL
Anion gap: 9 (ref 5–15)
BUN: 19 mg/dL (ref 8–23)
CO2: 20 mmol/L — ABNORMAL LOW (ref 22–32)
Calcium: 9 mg/dL (ref 8.9–10.3)
Chloride: 109 mmol/L (ref 98–111)
Creatinine, Ser: 0.76 mg/dL (ref 0.44–1.00)
GFR, Estimated: >60 mL/min (ref >60)
Glucose, Bld: 176 mg/dL — ABNORMAL HIGH (ref 70–99)
Potassium: 3.1 mmol/L — ABNORMAL LOW (ref 3.5–5.1)
Sodium: 138 mmol/L (ref 135–145)

## 2023-06-03 LAB — CBC
HCT: 46.5 % — ABNORMAL HIGH (ref 36.0–46.0)
Hemoglobin: 14.9 g/dL (ref 12.0–15.0)
MCH: 31.2 pg (ref 26.0–34.0)
MCHC: 32 g/dL (ref 30.0–36.0)
MCV: 97.5 fL (ref 80.0–100.0)
Platelets: 309 10*3/uL (ref 150–400)
RBC: 4.77 MIL/uL (ref 3.87–5.11)
RDW: 13.8 % (ref 11.5–15.5)
WBC: 18.3 10*3/uL — ABNORMAL HIGH (ref 4.0–10.5)
nRBC: 0 % (ref 0.0–0.2)

## 2023-06-03 MED ORDER — FENTANYL CITRATE PF 50 MCG/ML IJ SOSY
50.0000 ug | PREFILLED_SYRINGE | Freq: Once | INTRAMUSCULAR | Status: AC
  Administered 2023-06-03: 50 ug via INTRAMUSCULAR
  Filled 2023-06-03: qty 1

## 2023-06-03 NOTE — ED Triage Notes (Signed)
Patient wheeled to triage after being brought in by [facility] tonight from home after a fall. Patient states she slipped in the kitchen due to her socks. Complaints now of severe left arm pain and that she is unable to move it.  Denies hitting head or LOC. Denies use of thinners.

## 2023-06-03 NOTE — ED Notes (Signed)
Patient placed in left sling to provide comfort and support for arm.

## 2023-06-03 NOTE — ED Triage Notes (Signed)
First Nurse Note:  BIB [facility] from home. Pt c/o mechanical fall. Denies hitting head, LOC, or daily thinners. Pt c/o 10/10 L arm pain with limited ROM. Peripheral pulse palpable. CMS intact. Pt reports chronic arm pain to the L arm otherwise. Pt alert and oriented x 4 with EMS. GCS 15  EMS Vitals:  HR 91 96% RA 136/80

## 2023-06-04 ENCOUNTER — Emergency Department
Admission: EM | Admit: 2023-06-04 | Discharge: 2023-06-04 | Disposition: A | Discharge status: Home / Self Care | Payer: Medicare Other | Attending: Emergency Medicine | Admitting: Emergency Medicine

## 2023-06-04 ENCOUNTER — Emergency Department: Payer: Medicare Other

## 2023-06-04 DIAGNOSIS — M542 Cervicalgia: Secondary | ICD-10-CM | POA: Diagnosis not present

## 2023-06-04 DIAGNOSIS — G44309 Post-traumatic headache, unspecified, not intractable: Secondary | ICD-10-CM | POA: Diagnosis not present

## 2023-06-04 DIAGNOSIS — S42292A Other displaced fracture of upper end of left humerus, initial encounter for closed fracture: Secondary | ICD-10-CM | POA: Diagnosis not present

## 2023-06-04 DIAGNOSIS — S42202A Unspecified fracture of upper end of left humerus, initial encounter for closed fracture: Secondary | ICD-10-CM

## 2023-06-04 NOTE — Discharge Instructions (Addendum)
Keep your arm in the sling for comfort.  Use ice as this can help with pain and swelling.  Take acetaminophen 650 mg and ibuprofen 400 mg every 6 hours for pain.  Take with food.  Call Dr. Signa Kell or your orthopedist for a follow-up appointment.  Thank you for choosing [place] for your health care today!  Please see your primary doctor this week for a follow up appointment.   If you have any new, worsening, or unexpected symptoms call your doctor right away or come back to the emergency department for reevaluation.  It was my pleasure to care for you today.   Daneil Dan Modesto Charon, MD

## 2023-06-04 NOTE — ED Provider Notes (Signed)
[facility] Provider Note    Event Date/Time   First MD Initiated Contact with Patient 06/04/23 0101     (approximate)   History   Fall   HPI  Katherine [name] is a 72 y.o. female   Past medical history of depression and anxiety, hypertension, Parkinson's who presents emergency department mechanical slip and fall from home.  She tripped and fell onto her right side striking her left shoulder.  Left shoulder pain.  No obvious head strike or loss of consciousness no other injuries elsewhere.  She is normally ambulatory without any assistance and fully able to perform all activities of daily living.  No other acute medical complaints.  Independent Historian contributed to assessment above: Her spouse is at bedside to corroborate information past medical history as above     Physical Exam   Triage Vital Signs: ED Triage Vitals  Encounter Vitals Group     BP 06/03/23 2054 (!) 141/72     Systolic BP Percentile --      Diastolic BP Percentile --      Pulse Rate 06/03/23 2054 96     Resp 06/03/23 2054 18     Temp 06/03/23 2054 (!) 97.5 F (36.4 C)     Temp src --      SpO2 06/03/23 2054 98 %     Weight 06/03/23 2055 147 lb (66.7 kg)     Height 06/03/23 2055 5\' 2"  (1.575 m)     Head Circumference --      Peak Flow --      Pain Score 06/03/23 2055 10     Pain Loc --      Pain Education --      Exclude from Growth Chart --     Most recent vital signs: Vitals:   06/03/23 2054 06/04/23 0210  BP: (!) 141/72 (!) 140/110  Pulse: 96 80  Resp: 18 16  Temp: (!) 97.5 F (36.4 C) 98 F (36.7 C)  SpO2: 98% 99%    General: Awake, no distress.  CV:  Good peripheral perfusion.  Resp:  Normal effort.  Abd:  No distention.  Other:  Awake alert comfortable with tenderness to the left shoulder, unable to range due to pain.  Elbow remainder of the extremity appears normal and atraumatic with no bony tenderness, neurovascular intact.  Head and neck appear  atraumatic, remainder of extremities torso and abdomen appear atraumatic without any tenderness.   ED Results / Procedures / Treatments   Labs (all labs ordered are listed, but only abnormal results are displayed) Labs Reviewed  BASIC METABOLIC PANEL - Abnormal; Notable for the following components:      Result Value   Potassium 3.1 (*)    CO2 20 (*)    Glucose, Bld 176 (*)    All other components within normal limits  CBC - Abnormal; Notable for the following components:   WBC 18.3 (*)    HCT 46.5 (*)    All other components within normal limits     I ordered and reviewed the above labs they are notable for potassium slightly low, white blood cell count is high at 18 otherwise cell counts electrolytes unremarkable. RADIOLOGY I independently reviewed and interpreted x-ray of the shoulder and see a fracture of the proximal humerus I also reviewed radiologist's formal read.   PROCEDURES:  Critical Care performed: No  Procedures   MEDICATIONS ORDERED IN ED: Medications  fentaNYL (SUBLIMAZE) injection 50 mcg (50 mcg  Intramuscular Given 06/03/23 2331)    IMPRESSION / MDM / ASSESSMENT AND PLAN / ED COURSE  I reviewed the triage vital signs and the nursing notes.                                Patient's presentation is most consistent with acute presentation with potential threat to life or bodily function.  Differential diagnosis includes, but is not limited to, proximal humerus fracture, ICH or skull fracture, C-spine fracture dislocation   The patient is on the cardiac monitor to evaluate for evidence of arrhythmia and/or significant heart rate changes.  MDM:    Mechanical fall leading to proximal humerus fracture neurovascular intact immobilized with a sling and will follow-up with orthopedics.  CT head and neck look normal otherwise atraumatic on exam.  She wants to defer narcotics given allergy.  Outpatient follow-up with orthopedics, discharge.       FINAL  CLINICAL IMPRESSION(S) / ED DIAGNOSES   Final diagnoses:  Closed fracture of proximal end of left humerus, unspecified fracture morphology, initial encounter     Rx / DC Orders   ED Discharge Orders     None        Note:  This document was prepared using Dragon voice recognition software and may include unintentional dictation errors.    Pilar Jarvis, MD 06/04/23 [phone]

## 2023-06-05 NOTE — Progress Notes (Deleted)
BH MD/PA/NP OP Progress Note  06/05/2023 11:44 AM Katherine [name]  MRN:  [id number]  Chief Complaint: No chief complaint on file.  HPI:  - since the last visit, she went to ED due to mechanical slip and fall from home.   Visit Diagnosis: No diagnosis found.  Past Psychiatric History: Please see initial evaluation for full details. I have reviewed the history. No updates at this time.     Past Medical History:  Past Medical History:  Diagnosis Date   Anemia 2018   Anxiety    Arthritis    Borderline diabetes    Cataract    Chest pain    a. 2007 Cath: nl cors.  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 03/2022 MV: EF >65%, no ischemia/infarct. No signif cor/Ao Ca2+. Low risk.   Chicken pox    age 38   Chronic back pain    Compressed discs. [facility]   Depression    Diastolic dysfunction    a. 06/2020 Echo: EF 60-65%, no rwma, GrI DD, nl RV size/fxn. RVSP 30.5 mmHg.   GERD (gastroesophageal reflux disease)    High cholesterol    Hypertension    Hypokalemia    Migraine headache    Obesity    Parkinson's disease (HCC)    Pre-diabetes    okay now    Sinus tachycardia    a. 02/2022 Zio: predominantly sinus rhythm, avg of 99 bpm (77-134). Rare PACs/PVCs. Triggered events = sinus rhythm.    Past Surgical History:  Procedure Laterality Date   BLADDER SUSPENSION  2005   CARDIAC CATHETERIZATION     CESAREAN SECTION     CLOSED REDUCTION NASAL FRACTURE N/A 01/11/2020   Procedure: CLOSED REDUCTION NASAL FRACTURE;  Surgeon: Linus Salmons, MD;  [facility];  Service: ENT;  Laterality: N/A;   ENDOMETRIAL ABLATION  2007   FOOT SURGERY     bilateral bunions   LUMBAR LAMINECTOMY/DECOMPRESSION MICRODISCECTOMY Bilateral 01/12/2016   Procedure: Bilateral L4-5 Laminotomy/Foraminotomy;  Surgeon: Tressie Stalker, MD;  Location: [facility];  Service: Neurosurgery;  Laterality: Bilateral;  Bilateral  L4-5 Laminotomy/Foraminotomy   TONSILLECTOMY  1971    Family Psychiatric History: Please see initial evaluation for full details. I have reviewed the history. No updates at this time.     Family History:  Family History  Problem Relation Age of Onset   Depression Mother    Heart disease Mother    Hypertension Mother    Heart failure Mother        CHF   Diabetes Mother    Cancer Mother        CERVICAL CANCER   Atrial fibrillation Mother    Bipolar disorder Father    Heart disease Father    Diabetes Father    Rashes / Skin problems Father    Hypertension Father    Cirrhosis Father        liver disease non alcoholic   Bipolar disorder Sister    Diabetes Sister    Breast cancer Sister 42   Diabetes Mellitus II Sister    Heart disease Brother    Heart failure Brother        CHF   Diabetes Brother    Cancer Brother 60       THROAT AND NECK   Esophageal cancer Brother    Lung cancer Brother    Atrial fibrillation Brother    Cancer Maternal Grandfather  Colon cancer Neg Hx    Stomach cancer Neg Hx    Rectal cancer Neg Hx     Social History:  Social History   Socioeconomic History   Marital status: Married    Spouse name: Not on file   Number of children: 1   Years of education: Not on file   Highest education level: Not on file  Occupational History   Occupation: Magazine features editor: [employer]    Comment: retired-special needs teacher  Tobacco Use   Smoking status: Never   Smokeless tobacco: Never  Vaping Use   Vaping status: Never Used  Substance and Sexual Activity   Alcohol use: No   Drug use: No   Sexual activity: Yes    Partners: Male    Birth control/protection: Post-menopausal  Other Topics Concern   Not on file  Social History Narrative   Married.   One son is 72, lives in [place].   Retired from Agricultural consultant.   Enjoys substitute teaching, gardening, cooking.   Social Determinants of Health   Financial Resource Strain: Low  Risk  (07/30/2022)   Overall Financial Resource Strain (CARDIA)    Difficulty of Paying Living Expenses: Not hard at all  Food Insecurity: No Food Insecurity (07/30/2022)   Hunger Vital Sign    Worried About Running Out of Food in the Last Year: Never true    Ran Out of Food in the Last Year: Never true  Transportation Needs: No Transportation Needs (07/30/2022)   PRAPARE - Administrator, Civil Service (Medical): No    Lack of Transportation (Non-Medical): No  Physical Activity: Unknown (07/30/2022)   Exercise Vital Sign    Days of Exercise per Week: 0 days    Minutes of Exercise per Session: Not on file  Stress: Stress Concern Present (07/30/2022)   [employer] of Occupational Health - Occupational Stress Questionnaire    Feeling of Stress : To some extent  Social Connections: Unknown (07/30/2022)   Social Connection and Isolation Panel [NHANES]    Frequency of Communication with Friends and Family: Not on file    Frequency of Social Gatherings with Friends and Family: Not on file    Attends Religious Services: Not on file    Active Member of Clubs or Organizations: Not on file    Attends Banker Meetings: Not on file    Marital Status: Married    Allergies:  Allergies  Allergen Reactions   Chlorine Other (See Comments)    Smell causes lightheadedness.   Dilaudid [Hydromorphone]     hallucinations   Codeine Anxiety   Darvon Anxiety   Hydrocodone Anxiety   Meperidine Anxiety   Oxycodone Anxiety   Tramadol Anxiety   Victoza [Liraglutide] Nausea Only and Other (See Comments)    And weakness    Metabolic Disorder Labs: Lab Results  Component Value Date   HGBA1C 5.9 (H) 07/28/2022   MPG 120 01/09/2016   MPG 131 (H) 09/10/2014   No results found for: "PROLACTIN" Lab Results  Component Value Date   CHOL 211 (H) 03/10/2021   TRIG 266.0 (H) 03/10/2021   HDL 57.40 03/10/2021   CHOLHDL 4 03/10/2021   VLDL 53.2 (H) 03/10/2021   LDLCALC 60  04/15/2020   LDLCALC 118 (H) 07/09/2019   Lab Results  Component Value Date   TSH 1.180 12/02/2021   TSH 3.33 10/07/2020    Therapeutic Level Labs: No results found for: "LITHIUM" No results found for: "  VALPROATE" No results found for: "CBMZ"  Current Medications: Current Outpatient Medications  Medication Sig Dispense Refill   cariprazine (VRAYLAR) 1.5 MG capsule Take 1 capsule (1.5 mg total) by mouth daily. 30 capsule 0   doxycycline (PERIOSTAT) 20 MG tablet Take 20 mg by mouth 2 (two) times daily.     DULoxetine (CYMBALTA) 30 MG capsule Take 30 mg by mouth daily.     DULoxetine (CYMBALTA) 60 MG capsule Take 60 mg by mouth every morning. Takes with her 30mg  capsule to equal 90 mg daily     famotidine (PEPCID) 20 MG tablet TAKE 1 TABLET (20 MG TOTAL) BY MOUTH DAILY. FOR HEARTBURN. 90 tablet 3   Multiple Vitamin (MULTIVITAMIN) tablet Take 1 tablet by mouth daily.     NUPLAZID 34 MG CAPS Take 1 capsule by mouth daily.     pantoprazole (PROTONIX) 40 MG tablet TAKE 1 TABLET (40 MG TOTAL) BY MOUTH 2 (TWO) TIMES DAILY BEFORE A MEAL. FOR HEARTBURN. 180 tablet 1   propranolol (INDERAL) 20 MG tablet TAKE 1 TABLET (20 MG TOTAL) BY MOUTH 3 (THREE) TIMES DAILY AS NEEDED. FOR HEARTRATE >110 BPM 90 tablet 2   spironolactone (ALDACTONE) 25 MG tablet Take 1 tablet (25 mg total) by mouth daily. 90 tablet 3   SUMAtriptan (IMITREX) 100 MG tablet Take 1 tablet by mouth at migraine onset. Do not exceed 100 mg in 24 hours. 10 tablet 0   tamsulosin (FLOMAX) 0.4 MG CAPS capsule Take 0.4 mg by mouth.     topiramate (TOPAMAX) 50 MG tablet Take 1 tablet by mouth every morning and 2 tablets at night for migraine prevention. 270 tablet 1   No current facility-administered medications for this visit.     Musculoskeletal: Strength & Muscle Tone: within normal limits Gait & Station: normal Patient leans: N/A  Psychiatric Specialty Exam: Review of Systems  There were no vitals taken for this visit.There is  no height or weight on file to calculate BMI.  General Appearance: {Appearance:22683}  Eye Contact:  {BHH EYE CONTACT:22684}  Speech:  Clear and Coherent  Volume:  Normal  Mood:  {BHH MOOD:22306}  Affect:  {Affect (PAA):22687}  Thought Process:  Coherent  Orientation:  Full (Time, Place, and Person)  Thought Content: Logical   Suicidal Thoughts:  {ST/HT (PAA):22692}  Homicidal Thoughts:  {ST/HT (PAA):22692}  Memory:  Immediate;   Good  Judgement:  {Judgement (PAA):22694}  Insight:  {Insight (PAA):22695}  Psychomotor Activity:  Normal  Concentration:  Concentration: Good and Attention Span: Good  Recall:  Good  Fund of Knowledge: Good  Language: Good  Akathisia:  No  Handed:  Right  AIMS (if indicated): not done  Assets:  Communication Skills Desire for Improvement  ADL's:  Intact  Cognition: WNL  Sleep:  {BHH GOOD/FAIR/POOR:22877}   Screenings: GAD-7    Loss adjuster, chartered Office Visit from 04/21/2023 in [facility] Office Visit from 03/22/2023 in [facility] [place] Office Visit from 02/08/2023 in [facility] Visit from 09/20/2022 in [facility]  Total GAD-7 Score 13 9 6 8       Mini-Mental    Flowsheet Row Clinical Support from 07/06/2018 in [facility]  Total Score (max 30 points ) 20      PHQ2-9    Flowsheet Row Office Visit from 04/21/2023 in [facility] Office Visit from 04/12/2023 in [facility] Office Visit from 03/22/2023 in [facility] Office Visit from 02/08/2023 in [facility] Office Visit from 09/20/2022 in [facility] [place]  PHQ-2 Total Score 2 2 2 3 2   PHQ-9 Total Score 11 9 8 9 5       Flowsheet Row ED from 06/04/2023 in  [facility] at [facility] ED from 12/02/2021 in [facility] at [facility] ED from 05/11/2021 in [facility]   C-SSRS RISK CATEGORY No Risk No Risk No Risk        Assessment and Plan:  Katherine [name] is a 72 y.o. year old female with a history of depression, anxiety, mild cognitive impairment, who is referred for anxiety and depression.    1. Delusion (HCC) 2. Neurocognitive disorder Functional Status   IADL: Independent in the following: managing finances, medications, driving           Requires assistance with the following:  ADL  Independent in the following: bathing and hygiene, feeding, continence, grooming and toileting, walking          Requires assistance with the following: Folate, Vitamin B12, TSH Images  PET CT brain metabolic evaluation showed hypometabolism involving both cerebral hemispheres, basal ganglia and brainstem, MRI of the brain mild generalized parenchymal volume loss, without any particular lobar predominance, mild small vessel disease Neuropsych assessment:  Etiology:   Exam is notable for rumination on delusion, slightly pressured speech, although she is calm, redirectable, and oriented otherwise. The differential diagnosis includes delusion secondary to neurocognitive disorder, medication-induced mania (as she was recently on mirtazapine), and bipolar disorder (she has a family history of bipolar), although the onset is not typical.    She had adverse reaction from quetiapine, trazodone, and would not be a good candidate at this time for clozapine as it requires frequent blood monitoring. Will start olanzapine to target delusion, insomnia and appetite loss.  Although it is not preferable to use along with nuplazid, she is already on maximum dose of this medication, and it has limited benefit.  Will consider monotherapy if she has good benefit from olanzapine.  This potential risk of  QTc prolongation, EPS/parkinsonism, increased mortality with people with dementia.  Will obtain lab to rule out any medical health issues contributing to her symptoms. (She would like urinalysis to be obtained- will order this to rule out delirium.) She will keep her appointment with neurologist for further evaluation.    3. Anxiety disorder, unspecified type Unchanged. She reports anxiety for many years, which has been significantly worsening since March due to issues with her husband.  Will intervene as outlined above.  Noted that she has chest pain and she is undergoing evaluation with her PCP.     # weight loss According to the chart review, she was seen by a gastroenterologist in 2023. "Last colonoscopy October 2018 with finding of a 9 mm polyp in the ascending colon which was a tubular adenoma and noted multiple diverticuli." She had EGD with dilation in Sept 2023, although the detailed reports are not available.  Although mood symptoms can suddenly close weight loss, we will examine also out any medical health issues contributing to it.    Plan Continue duloxetine 90 mg daily  Start olanzapine 2.5 mg at night Hold Trazodone Obtain lab (TSH, folate), urine drug screening Next appointment: 11/21 at 8:30, IP  Obtain ROI for 2 way conversation with her son, Lawrence Santiago [phone]  - on nuplazid 34 mg daily    Past trials: quetiapine- nightmares, trazodone- headache   The patient demonstrates the following risk factors for suicide: Chronic risk factors for suicide include: psychiatric disorder of depression . Acute risk factors for suicide include: family or marital conflict and unemployment. Protective factors for this patient include: positive social support and hope for the future. Considering these factors, the overall suicide risk at this point appears to be low. Patient is appropriate for outpatient follow up.     Collaboration of Care: Collaboration of Care: {BH OP Collaboration of  Care:21014065}  Patient/Guardian was advised Release of Information must be obtained prior to any record release in order to collaborate their care with an outside provider. Patient/Guardian was advised if they have not already done so to contact the registration department to sign all necessary forms in order for [place] to release information regarding their care.   Consent: Patient/Guardian gives verbal consent for treatment and assignment of benefits for services provided during this visit. Patient/Guardian expressed understanding and agreed to proceed.    Neysa Hotter, MD 06/05/2023, 11:44 AM

## 2023-06-06 ENCOUNTER — Encounter: Payer: Self-pay | Admitting: Internal Medicine

## 2023-06-06 ENCOUNTER — Ambulatory Visit: Payer: Medicare Other | Admitting: Neurology

## 2023-06-07 ENCOUNTER — Ambulatory Visit (HOSPITAL_BASED_OUTPATIENT_CLINIC_OR_DEPARTMENT_OTHER): Payer: Medicare Other | Admitting: Student

## 2023-06-07 ENCOUNTER — Other Ambulatory Visit (HOSPITAL_BASED_OUTPATIENT_CLINIC_OR_DEPARTMENT_OTHER): Payer: Self-pay

## 2023-06-07 ENCOUNTER — Encounter (HOSPITAL_BASED_OUTPATIENT_CLINIC_OR_DEPARTMENT_OTHER): Payer: Self-pay | Admitting: Student

## 2023-06-07 ENCOUNTER — Encounter: Payer: Self-pay | Admitting: Internal Medicine

## 2023-06-07 DIAGNOSIS — S42202A Unspecified fracture of upper end of left humerus, initial encounter for closed fracture: Secondary | ICD-10-CM

## 2023-06-07 MED ORDER — ACETAMINOPHEN 500 MG PO TABS
500.0000 mg | ORAL_TABLET | Freq: Four times a day (QID) | ORAL | 0 refills | Status: AC | PRN
  Filled 2023-06-07: qty 30, 8d supply, fill #0

## 2023-06-07 MED ORDER — IBUPROFEN 800 MG PO TABS
800.0000 mg | ORAL_TABLET | Freq: Three times a day (TID) | ORAL | 0 refills | Status: AC | PRN
  Filled 2023-06-07: qty 30, 10d supply, fill #0

## 2023-06-07 NOTE — Progress Notes (Unsigned)
Chief Complaint: Left humerus fracture     History of Present Illness:    Katherine [name] is a 72 y.o. female presents today for follow-up of a left proximal humerus fracture.  She had a fall at home 4 nights ago onto her left side and was seen later that night in the emergency department.  X-ray and CT taken in the ED revealed a displaced proximal humerus fracture.  She was placed in a sling at that time.  Patient states that she is unable to take most strong pain medicines due to allergies/adverse effects so has been taking ibuprofen for pain.  She rates pain as 10/10 today.  Notes extensive bruising over her left shoulder and upper arm.  Denies any numbness or tingling down the arm or into the hand.   Surgical History:   None  PMH/PSH/Family History/Social History/Meds/Allergies:    Past Medical History:  Diagnosis Date   Anemia 2018   Anxiety    Arthritis    Borderline diabetes    Cataract    Chest pain    a. 2007 Cath: nl cors.  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 03/2022 MV: EF >65%, no ischemia/infarct. No signif cor/Ao Ca2+. Low risk.   Chicken pox    age 30   Chronic back pain    Compressed discs. [facility]   Depression    Diastolic dysfunction    a. 06/2020 Echo: EF 60-65%, no rwma, GrI DD, nl RV size/fxn. RVSP 30.5 mmHg.   GERD (gastroesophageal reflux disease)    High cholesterol    Hypertension    Hypokalemia    Migraine headache    Obesity    Parkinson's disease (HCC)    Pre-diabetes    okay now    Sinus tachycardia    a. 02/2022 Zio: predominantly sinus rhythm, avg of 99 bpm (77-134). Rare PACs/PVCs. Triggered events = sinus rhythm.   Past Surgical History:  Procedure Laterality Date   BLADDER SUSPENSION  2005   CARDIAC CATHETERIZATION     CESAREAN SECTION     CLOSED REDUCTION NASAL FRACTURE N/A 01/11/2020   Procedure: CLOSED REDUCTION NASAL  FRACTURE;  Surgeon: Linus Salmons, MD;  Location: [facility];  Service: ENT;  Laterality: N/A;   ENDOMETRIAL ABLATION  2007   FOOT SURGERY     bilateral bunions   LUMBAR LAMINECTOMY/DECOMPRESSION MICRODISCECTOMY Bilateral 01/12/2016   Procedure: Bilateral L4-5 Laminotomy/Foraminotomy;  Surgeon: Tressie Stalker, MD;  Location: [facility];  Service: Neurosurgery;  Laterality: Bilateral;  Bilateral L4-5 Laminotomy/Foraminotomy   TONSILLECTOMY  1971   Social History   Socioeconomic History   Marital status: Married    Spouse name: Not on file   Number of children: 1   Years of education: Not on file   Highest education level: Not on file  Occupational History   Occupation: Magazine features editor: [employer]    Comment: retired-special needs teacher  Tobacco Use   Smoking status: Never   Smokeless tobacco: Never  Vaping Use   Vaping status: Never Used  Substance and Sexual Activity   Alcohol use: No   Drug use: No   Sexual activity: Yes    Partners: Male    Birth control/protection: Post-menopausal  Other Topics Concern  Not on file  Social History Narrative   Married.   One son is 24, lives in [place].   Retired from Agricultural consultant.   Enjoys substitute teaching, gardening, cooking.   Social Determinants of Health   Financial Resource Strain: Low Risk  (07/30/2022)   Overall Financial Resource Strain (CARDIA)    Difficulty of Paying Living Expenses: Not hard at all  Food Insecurity: No Food Insecurity (07/30/2022)   Hunger Vital Sign    Worried About Running Out of Food in the Last Year: Never true    Ran Out of Food in the Last Year: Never true  Transportation Needs: No Transportation Needs (07/30/2022)   PRAPARE - Administrator, Civil Service (Medical): No    Lack of Transportation (Non-Medical): No  Physical Activity: Unknown (07/30/2022)   Exercise Vital Sign    Days of Exercise per Week: 0 days    Minutes of Exercise per Session: Not  on file  Stress: Stress Concern Present (07/30/2022)   [employer] of Occupational Health - Occupational Stress Questionnaire    Feeling of Stress : To some extent  Social Connections: Unknown (07/30/2022)   Social Connection and Isolation Panel [NHANES]    Frequency of Communication with Friends and Family: Not on file    Frequency of Social Gatherings with Friends and Family: Not on file    Attends Religious Services: Not on Marketing executive or Organizations: Not on file    Attends Banker Meetings: Not on file    Marital Status: Married   Family History  Problem Relation Age of Onset   Depression Mother    Heart disease Mother    Hypertension Mother    Heart failure Mother        CHF   Diabetes Mother    Cancer Mother        CERVICAL CANCER   Atrial fibrillation Mother    Bipolar disorder Father    Heart disease Father    Diabetes Father    Rashes / Skin problems Father    Hypertension Father    Cirrhosis Father        liver disease non alcoholic   Bipolar disorder Sister    Diabetes Sister    Breast cancer Sister 63   Diabetes Mellitus II Sister    Heart disease Brother    Heart failure Brother        CHF   Diabetes Brother    Cancer Brother 68       THROAT AND NECK   Esophageal cancer Brother    Lung cancer Brother    Atrial fibrillation Brother    Cancer Maternal Grandfather    Colon cancer Neg Hx    Stomach cancer Neg Hx    Rectal cancer Neg Hx    Allergies  Allergen Reactions   Chlorine Other (See Comments)    Smell causes lightheadedness.   Dilaudid [Hydromorphone]     hallucinations   Codeine Anxiety   Darvon Anxiety   Hydrocodone Anxiety   Meperidine Anxiety   Oxycodone Anxiety   Tramadol Anxiety   Victoza [Liraglutide] Nausea Only and Other (See Comments)    And weakness   Current Outpatient Medications  Medication Sig Dispense Refill   acetaminophen (TYLENOL) 500 MG tablet Take 1 tablet (500 mg total) by  mouth every 6 (six) hours as needed for up to 14 days. 30 tablet 0   ibuprofen (ADVIL) 800 MG tablet Take 1  tablet (800 mg total) by mouth every 8 (eight) hours as needed for up to 14 days. 30 tablet 0   cariprazine (VRAYLAR) 1.5 MG capsule Take 1 capsule (1.5 mg total) by mouth daily. 30 capsule 0   doxycycline (PERIOSTAT) 20 MG tablet Take 20 mg by mouth 2 (two) times daily.     DULoxetine (CYMBALTA) 30 MG capsule Take 30 mg by mouth daily.     DULoxetine (CYMBALTA) 60 MG capsule Take 60 mg by mouth every morning. Takes with her 30mg  capsule to equal 90 mg daily     famotidine (PEPCID) 20 MG tablet TAKE 1 TABLET (20 MG TOTAL) BY MOUTH DAILY. FOR HEARTBURN. 90 tablet 3   Multiple Vitamin (MULTIVITAMIN) tablet Take 1 tablet by mouth daily.     NUPLAZID 34 MG CAPS Take 1 capsule by mouth daily.     pantoprazole (PROTONIX) 40 MG tablet TAKE 1 TABLET (40 MG TOTAL) BY MOUTH 2 (TWO) TIMES DAILY BEFORE A MEAL. FOR HEARTBURN. 180 tablet 1   propranolol (INDERAL) 20 MG tablet TAKE 1 TABLET (20 MG TOTAL) BY MOUTH 3 (THREE) TIMES DAILY AS NEEDED. FOR HEARTRATE >110 BPM 90 tablet 2   spironolactone (ALDACTONE) 25 MG tablet Take 1 tablet (25 mg total) by mouth daily. 90 tablet 3   SUMAtriptan (IMITREX) 100 MG tablet Take 1 tablet by mouth at migraine onset. Do not exceed 100 mg in 24 hours. 10 tablet 0   tamsulosin (FLOMAX) 0.4 MG CAPS capsule Take 0.4 mg by mouth.     topiramate (TOPAMAX) 50 MG tablet Take 1 tablet by mouth every morning and 2 tablets at night for migraine prevention. 270 tablet 1   No current facility-administered medications for this visit.   No results found.  Review of Systems:   A ROS was performed including pertinent positives and negatives as documented in the HPI.  Physical Exam :   Constitutional: NAD and appears stated age Neurological: Alert and oriented Psych: Appropriate affect and cooperative There were no vitals taken for this visit.   Comprehensive Musculoskeletal  Exam:    Extensive ecchymosis noted in the left shoulder extending down the left upper extremity toward the elbow.  Pain to palpation throughout the upper arm.  Able to flex and extend the elbow.  Radial pulses 2+ with brisk capillary refill to all 5 fingers.  Distal neurosensory exam is intact.  Imaging:   Xray review from 06/03/2023 (left humerus 3 views): ***   I personally reviewed and interpreted the radiographs.   Assessment:   72 y.o. female ***  Plan :    -Remain in sling and return to clinic in 4 weeks for repeat x-ray and reassessment -Sent Tylenol and ibuprofen for pain control     I personally saw and evaluated the patient, and participated in the management and treatment plan.  Hazle Nordmann, PA-C Orthopedics

## 2023-06-08 ENCOUNTER — Ambulatory Visit: Payer: Medicare Other

## 2023-06-09 ENCOUNTER — Ambulatory Visit: Payer: Medicare Other | Admitting: Psychiatry

## 2023-06-09 ENCOUNTER — Ambulatory Visit: Payer: Medicare Other

## 2023-06-09 DIAGNOSIS — S42292A Other displaced fracture of upper end of left humerus, initial encounter for closed fracture: Secondary | ICD-10-CM | POA: Diagnosis not present

## 2023-06-13 ENCOUNTER — Other Ambulatory Visit: Payer: Medicare Other | Admitting: *Deleted

## 2023-06-13 ENCOUNTER — Ambulatory Visit: Payer: No Typology Code available for payment source

## 2023-06-13 DIAGNOSIS — R3911 Hesitancy of micturition: Secondary | ICD-10-CM | POA: Diagnosis not present

## 2023-06-14 ENCOUNTER — Ambulatory Visit (INDEPENDENT_AMBULATORY_CARE_PROVIDER_SITE_OTHER): Payer: Medicare Other | Admitting: Primary Care

## 2023-06-14 ENCOUNTER — Ambulatory Visit: Payer: Medicare Other | Admitting: Orthopaedic Surgery

## 2023-06-14 ENCOUNTER — Encounter: Payer: Self-pay | Admitting: Internal Medicine

## 2023-06-14 ENCOUNTER — Encounter: Payer: Self-pay | Admitting: Primary Care

## 2023-06-14 VITALS — BP 144/82 | HR 97 | Temp 98.2°F | Ht 62.0 in | Wt 146.0 lb

## 2023-06-14 DIAGNOSIS — Z23 Encounter for immunization: Secondary | ICD-10-CM | POA: Diagnosis not present

## 2023-06-14 DIAGNOSIS — G3109 Other frontotemporal dementia: Secondary | ICD-10-CM | POA: Diagnosis not present

## 2023-06-14 DIAGNOSIS — F0280 Dementia in other diseases classified elsewhere without behavioral disturbance: Secondary | ICD-10-CM | POA: Diagnosis not present

## 2023-06-14 DIAGNOSIS — S42352A Displaced comminuted fracture of shaft of humerus, left arm, initial encounter for closed fracture: Secondary | ICD-10-CM | POA: Insufficient documentation

## 2023-06-14 DIAGNOSIS — S42352S Displaced comminuted fracture of shaft of humerus, left arm, sequela: Secondary | ICD-10-CM | POA: Diagnosis not present

## 2023-06-14 HISTORY — DX: Displaced comminuted fracture of shaft of humerus, left arm, initial encounter for closed fracture: S42.352A

## 2023-06-14 NOTE — Progress Notes (Signed)
Subjective:    Patient ID: Katherine [name], female    DOB: 1950/08/31, 72 y.o.   MRN: [id number]  HPI  Katherine [name] is a very pleasant 72 y.o. female with a history of frontotemporal dementia, lewy body dementia, chronic migraines, hypertension, chronic back pain, osteopenia, recurrent falls, depression, prediabetes, chronic fatigue who presents today to discuss multiple concerns.  She is here today as she is worried about her husband's memory. She believes he has Alzheimer's as he is forgetting things, forgetting his children's names, putting things in the refrigerator that do not belong, and giving away her belongings.  She is not eating much "because we only eat what Loraine Leriche likes". She reports that her husband burns everything he cooks.   Wt Readings from Last 3 Encounters:  06/14/23 146 lb (66.2 kg)  06/03/23 147 lb (66.7 kg)  05/04/23 148 lb (67.1 kg)     She had a mechanical fall at home on 06/04/23, evaluated at [facility] ED that day. She tripped and fell, injuring her left shoulder. She underwent multiple xrays and CT scans which revealed displaced comminuted proximal left humeral fracture. Following with orthopedics, initial visit was last week.   She is managed on vitamin B12 injections on a specified regimen. She has missed several nurse visit appointments.    BP Readings from Last 3 Encounters:  06/14/23 (!) 144/82  06/04/23 (!) 140/110  05/04/23 100/64       Review of Systems  Respiratory:  Negative for shortness of breath.   Cardiovascular:  Negative for chest pain.  Musculoskeletal:  Positive for arthralgias.  Skin:  Positive for color change.  Neurological:  Negative for dizziness and headaches.         Past Medical History:  Diagnosis Date   Anemia 2018   Anxiety    Arthritis    Borderline diabetes    Cataract    Chest pain    a. 2007 Cath: nl cors.  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 03/2022 MV:  EF >65%, no ischemia/infarct. No signif cor/Ao Ca2+. Low risk.   Chicken pox    age 69   Chronic back pain    Compressed discs. [facility]   Depression    Diastolic dysfunction    a. 06/2020 Echo: EF 60-65%, no rwma, GrI DD, nl RV size/fxn. RVSP 30.5 mmHg.   GERD (gastroesophageal reflux disease)    High cholesterol    Hypertension    Hypokalemia    Migraine headache    Obesity    Parkinson's disease (HCC)    Pre-diabetes    okay now    Sinus tachycardia    a. 02/2022 Zio: predominantly sinus rhythm, avg of 99 bpm (77-134). Rare PACs/PVCs. Triggered events = sinus rhythm.    Social History   Socioeconomic History   Marital status: Married    Spouse name: Not on file   Number of children: 1   Years of education: Not on file   Highest education level: Not on file  Occupational History   Occupation: Magazine features editor: [employer]    Comment: retired-special needs teacher  Tobacco Use   Smoking status: Never   Smokeless tobacco: Never  Vaping Use   Vaping status: Never Used  Substance and Sexual Activity   Alcohol use: No   Drug use: No   Sexual activity: Yes    Partners: Male    Birth control/protection: Post-menopausal  Other Topics Concern   Not on file  Social History Narrative   Married.   One son is 54, lives in [place].   Retired from Agricultural consultant.   Enjoys substitute teaching, gardening, cooking.   Social Determinants of Health   Financial Resource Strain: Low Risk  (07/30/2022)   Overall Financial Resource Strain (CARDIA)    Difficulty of Paying Living Expenses: Not hard at all  Food Insecurity: No Food Insecurity (07/30/2022)   Hunger Vital Sign    Worried About Running Out of Food in the Last Year: Never true    Ran Out of Food in the Last Year: Never true  Transportation Needs: No Transportation Needs (07/30/2022)   PRAPARE - Administrator, Civil Service (Medical): No    Lack of Transportation  (Non-Medical): No  Physical Activity: Unknown (07/30/2022)   Exercise Vital Sign    Days of Exercise per Week: 0 days    Minutes of Exercise per Session: Not on file  Stress: Stress Concern Present (07/30/2022)   [employer] of Occupational Health - Occupational Stress Questionnaire    Feeling of Stress : To some extent  Social Connections: Unknown (07/30/2022)   Social Connection and Isolation Panel [NHANES]    Frequency of Communication with Friends and Family: Not on file    Frequency of Social Gatherings with Friends and Family: Not on file    Attends Religious Services: Not on file    Active Member of Clubs or Organizations: Not on file    Attends Banker Meetings: Not on file    Marital Status: Married  Intimate Partner Violence: Not At Risk (07/30/2022)   Humiliation, Afraid, Rape, and Kick questionnaire    Fear of Current or Ex-Partner: No    Emotionally Abused: No    Physically Abused: No    Sexually Abused: No    Past Surgical History:  Procedure Laterality Date   BLADDER SUSPENSION  2005   CARDIAC CATHETERIZATION     CESAREAN SECTION     CLOSED REDUCTION NASAL FRACTURE N/A 01/11/2020   Procedure: CLOSED REDUCTION NASAL FRACTURE;  Surgeon: Linus Salmons, MD;  Location: [facility];  Service: ENT;  Laterality: N/A;   ENDOMETRIAL ABLATION  2007   FOOT SURGERY     bilateral bunions   LUMBAR LAMINECTOMY/DECOMPRESSION MICRODISCECTOMY Bilateral 01/12/2016   Procedure: Bilateral L4-5 Laminotomy/Foraminotomy;  Surgeon: Tressie Stalker, MD;  Location: [facility];  Service: Neurosurgery;  Laterality: Bilateral;  Bilateral L4-5 Laminotomy/Foraminotomy   TONSILLECTOMY  1971    Family History  Problem Relation Age of Onset   Depression Mother    Heart disease Mother    Hypertension Mother    Heart failure Mother        CHF   Diabetes Mother    Cancer Mother        CERVICAL CANCER   Atrial fibrillation Mother    Bipolar disorder Father    Heart  disease Father    Diabetes Father    Rashes / Skin problems Father    Hypertension Father    Cirrhosis Father        liver disease non alcoholic   Bipolar disorder Sister    Diabetes Sister    Breast cancer Sister 53   Diabetes Mellitus II Sister    Heart disease Brother    Heart failure Brother        CHF   Diabetes Brother    Cancer Brother 14  THROAT AND NECK   Esophageal cancer Brother    Lung cancer Brother    Atrial fibrillation Brother    Cancer Maternal Grandfather    Colon cancer Neg Hx    Stomach cancer Neg Hx    Rectal cancer Neg Hx     Allergies  Allergen Reactions   Chlorine Other (See Comments)    Smell causes lightheadedness.   Dilaudid [Hydromorphone]     hallucinations   Codeine Anxiety   Darvon Anxiety   Hydrocodone Anxiety   Meperidine Anxiety   Oxycodone Anxiety   Tramadol Anxiety   Victoza [Liraglutide] Nausea Only and Other (See Comments)    And weakness    Current Outpatient Medications on File Prior to Visit  Medication Sig Dispense Refill   acetaminophen (TYLENOL) 500 MG tablet Take 1 tablet (500 mg total) by mouth every 6 (six) hours as needed for up to 14 days. 30 tablet 0   doxycycline (PERIOSTAT) 20 MG tablet Take 20 mg by mouth 2 (two) times daily.     DULoxetine (CYMBALTA) 30 MG capsule Take 30 mg by mouth daily.     DULoxetine (CYMBALTA) 60 MG capsule Take 60 mg by mouth every morning. Takes with her 30mg  capsule to equal 90 mg daily     famotidine (PEPCID) 20 MG tablet TAKE 1 TABLET (20 MG TOTAL) BY MOUTH DAILY. FOR HEARTBURN. 90 tablet 3   ibuprofen (ADVIL) 800 MG tablet Take 1 tablet (800 mg total) by mouth every 8 (eight) hours as needed for up to 14 days. 30 tablet 0   Multiple Vitamin (MULTIVITAMIN) tablet Take 1 tablet by mouth daily.     NUPLAZID 34 MG CAPS Take 1 capsule by mouth daily.     pantoprazole (PROTONIX) 40 MG tablet TAKE 1 TABLET (40 MG TOTAL) BY MOUTH 2 (TWO) TIMES DAILY BEFORE A MEAL. FOR HEARTBURN. 180  tablet 1   propranolol (INDERAL) 20 MG tablet TAKE 1 TABLET (20 MG TOTAL) BY MOUTH 3 (THREE) TIMES DAILY AS NEEDED. FOR HEARTRATE >110 BPM 90 tablet 2   spironolactone (ALDACTONE) 25 MG tablet Take 1 tablet (25 mg total) by mouth daily. 90 tablet 3   SUMAtriptan (IMITREX) 100 MG tablet Take 1 tablet by mouth at migraine onset. Do not exceed 100 mg in 24 hours. 10 tablet 0   tamsulosin (FLOMAX) 0.4 MG CAPS capsule Take 0.4 mg by mouth.     topiramate (TOPAMAX) 50 MG tablet Take 1 tablet by mouth every morning and 2 tablets at night for migraine prevention. 270 tablet 1   No current facility-administered medications on file prior to visit.    BP (!) 144/82   Pulse 97   Temp 98.2 F (36.8 C) (Temporal)   Ht 5\' 2"  (1.575 m)   Wt 146 lb (66.2 kg)   SpO2 99%   BMI 26.70 kg/m  Objective:   Physical Exam Cardiovascular:     Rate and Rhythm: Normal rate and regular rhythm.  Pulmonary:     Effort: Pulmonary effort is normal.     Breath sounds: Normal breath sounds.  Musculoskeletal:     Cervical back: Neck supple.  Skin:    General: Skin is warm and dry.     Findings: Bruising present.     Comments: Yellow bruising to left anterior chest and shoulder  Neurological:     Mental Status: She is alert and oriented to person, place, and time.  Psychiatric:        Mood and Affect:  Mood normal.           Assessment & Plan:  Frontotemporal dementia [facility]) Assessment & Plan: Seems to be progressing based on our conversation today. She spent most of our visit today discussing her husband and his "memory problem".  Following with neurology, reviewed office notes from October 2024. Proceed with EEG and MR Brain. Reminded patient today.  Continue with vitamin B12 injections as scheduled.  Continue psychiatry follow up.   Closed displaced comminuted fracture of shaft of left humerus, sequela Assessment & Plan: Reviewed ED notes and imaging from November 2024. Follow up with  orthopedics as scheduled.    Encounter for immunization -     Flu Vaccine Trivalent High Dose (Fluad)        Doreene Nest, NP

## 2023-06-14 NOTE — Patient Instructions (Signed)
Schedule to have your vitamin B12 injection for next week.  Complete your EEG and MRI of the brain as discussed by neurology.  It was a pleasure to see you today!

## 2023-06-14 NOTE — Assessment & Plan Note (Signed)
Reviewed ED notes and imaging from November 2024. Follow up with orthopedics as scheduled.

## 2023-06-14 NOTE — Assessment & Plan Note (Signed)
Seems to be progressing based on our conversation today. She spent most of our visit today discussing her husband and his "memory problem".  Following with neurology, reviewed office notes from October 2024. Proceed with EEG and MR Brain. Reminded patient today.  Continue with vitamin B12 injections as scheduled.  Continue psychiatry follow up.

## 2023-06-15 ENCOUNTER — Ambulatory Visit (HOSPITAL_BASED_OUTPATIENT_CLINIC_OR_DEPARTMENT_OTHER): Payer: No Typology Code available for payment source | Admitting: Orthopaedic Surgery

## 2023-06-20 DIAGNOSIS — S42292A Other displaced fracture of upper end of left humerus, initial encounter for closed fracture: Secondary | ICD-10-CM | POA: Diagnosis not present

## 2023-06-21 ENCOUNTER — Ambulatory Visit: Payer: Medicare Other | Admitting: Neurology

## 2023-06-22 ENCOUNTER — Ambulatory Visit: Payer: Medicare Other

## 2023-06-22 ENCOUNTER — Other Ambulatory Visit: Payer: Self-pay | Admitting: Primary Care

## 2023-06-22 DIAGNOSIS — G43709 Chronic migraine without aura, not intractable, without status migrainosus: Secondary | ICD-10-CM

## 2023-06-26 ENCOUNTER — Other Ambulatory Visit: Payer: Self-pay | Admitting: Primary Care

## 2023-06-26 DIAGNOSIS — F32A Depression, unspecified: Secondary | ICD-10-CM

## 2023-06-26 DIAGNOSIS — F411 Generalized anxiety disorder: Secondary | ICD-10-CM

## 2023-06-27 ENCOUNTER — Telehealth: Payer: Self-pay | Admitting: Neurology

## 2023-06-27 NOTE — Telephone Encounter (Signed)
Called and stated that pt has:  I called pt and she says that Dr. Terrace Arabia says she doesn't have any form of dementia. Routing to provider to confirm.

## 2023-06-27 NOTE — Telephone Encounter (Signed)
Pt states she need clarification regarding her diagnosis due her applying for a new supplement through her insurance. Needs proof she doesn't have dementia. Requesting call back.

## 2023-06-28 ENCOUNTER — Encounter: Payer: Self-pay | Admitting: Neurology

## 2023-06-28 NOTE — Telephone Encounter (Signed)
I entered her back through MyChart message, will not generate separate note

## 2023-06-29 ENCOUNTER — Telehealth: Payer: Self-pay | Admitting: Neurology

## 2023-06-29 NOTE — Telephone Encounter (Signed)
Pt states she doesn't use MyChart and would like her office notes sent to her another way. Transferred to medical records

## 2023-06-30 ENCOUNTER — Telehealth: Payer: Self-pay | Admitting: *Deleted

## 2023-06-30 ENCOUNTER — Ambulatory Visit (INDEPENDENT_AMBULATORY_CARE_PROVIDER_SITE_OTHER): Payer: Self-pay | Admitting: Psychiatry

## 2023-06-30 DIAGNOSIS — Z91199 Patient's noncompliance with other medical treatment and regimen due to unspecified reason: Secondary | ICD-10-CM

## 2023-06-30 NOTE — Telephone Encounter (Signed)
I called pt no answer not able to leave message

## 2023-06-30 NOTE — Progress Notes (Signed)
No show

## 2023-07-04 ENCOUNTER — Telehealth: Payer: Self-pay

## 2023-07-04 NOTE — Telephone Encounter (Signed)
Medication management - Call from patient requesting a call back from patient access staff. Agreed to send the message with request.

## 2023-07-05 ENCOUNTER — Other Ambulatory Visit: Payer: Self-pay | Admitting: Psychiatry

## 2023-07-05 ENCOUNTER — Telehealth: Payer: Self-pay

## 2023-07-05 MED ORDER — REXULTI 0.5 MG PO TABS: 0.5000 mg | ORAL_TABLET | Freq: Every day | ORAL | 0 refills | Status: DC

## 2023-07-05 NOTE — Telephone Encounter (Signed)
Patient left voicemail stating that she needed a refill but not sure which medication called patient to ask no answer left voicemail for patient to return call to office

## 2023-07-05 NOTE — Telephone Encounter (Signed)
Called patient to make aware that the Rexulti that she requested has been sent to the pharmacy patient voiced understanding

## 2023-07-05 NOTE — Telephone Encounter (Signed)
Rexulti is ordered to the pharmacy.

## 2023-07-05 NOTE — Telephone Encounter (Signed)
Patient called requesting to start Rexulti she stated that she still not getting any sleep and no appetite she also mentioned that she is still currently taking the Cymbalt. Please advise.

## 2023-07-05 NOTE — Progress Notes (Deleted)
BH MD/PA/NP OP Progress Note  07/05/2023 6:04 PM Katherine [name]  MRN:  [id number]  Chief Complaint: No chief complaint on file.  HPI: *** Visit Diagnosis: No diagnosis found.  Past Psychiatric History: Please see initial evaluation for full details. I have reviewed the history. No updates at this time.     Past Medical History:  Past Medical History:  Diagnosis Date   Anemia 2018   Anxiety    Arthritis    Borderline diabetes    Cataract    Chest pain    a. 2007 Cath: nl cors.  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 03/2022 MV: EF >65%, no ischemia/infarct. No signif cor/Ao Ca2+. Low risk.   Chicken pox    age 72   Chronic back pain    Compressed discs. [facility]   Depression    Diastolic dysfunction    a. 06/2020 Echo: EF 60-65%, no rwma, GrI DD, nl RV size/fxn. RVSP 30.5 mmHg.   GERD (gastroesophageal reflux disease)    High cholesterol    Hypertension    Hypokalemia    Migraine headache    Obesity    Parkinson's disease (HCC)    Pre-diabetes    okay now    Sinus tachycardia    a. 02/2022 Zio: predominantly sinus rhythm, avg of 99 bpm (77-134). Rare PACs/PVCs. Triggered events = sinus rhythm.    Past Surgical History:  Procedure Laterality Date   BLADDER SUSPENSION  2005   CARDIAC CATHETERIZATION     CESAREAN SECTION     CLOSED REDUCTION NASAL FRACTURE N/A 01/11/2020   Procedure: CLOSED REDUCTION NASAL FRACTURE;  Surgeon: Linus Salmons, MD;  Location: [facility];  Service: ENT;  Laterality: N/A;   ENDOMETRIAL ABLATION  2007   FOOT SURGERY     bilateral bunions   LUMBAR LAMINECTOMY/DECOMPRESSION MICRODISCECTOMY Bilateral 01/12/2016   Procedure: Bilateral L4-5 Laminotomy/Foraminotomy;  Surgeon: Tressie Stalker, MD;  Location: [facility];  Service: Neurosurgery;  Laterality: Bilateral;  Bilateral L4-5 Laminotomy/Foraminotomy   TONSILLECTOMY  1971    Family Psychiatric History:  Please see initial evaluation for full details. I have reviewed the history. No updates at this time.    Family History:  Family History  Problem Relation Age of Onset   Depression Mother    Heart disease Mother    Hypertension Mother    Heart failure Mother        CHF   Diabetes Mother    Cancer Mother        CERVICAL CANCER   Atrial fibrillation Mother    Bipolar disorder Father    Heart disease Father    Diabetes Father    Rashes / Skin problems Father    Hypertension Father    Cirrhosis Father        liver disease non alcoholic   Bipolar disorder Sister    Diabetes Sister    Breast cancer Sister 22   Diabetes Mellitus II Sister    Heart disease Brother    Heart failure Brother        CHF   Diabetes Brother    Cancer Brother 40       THROAT AND NECK   Esophageal cancer Brother    Lung cancer Brother    Atrial fibrillation Brother    Cancer Maternal Grandfather    Colon cancer Neg Hx    Stomach cancer Neg Hx    Rectal cancer Neg Hx  Social History:  Social History   Socioeconomic History   Marital status: Married    Spouse name: Not on file   Number of children: 1   Years of education: Not on file   Highest education level: Not on file  Occupational History   Occupation: Magazine features editor: [employer]    Comment: retired-special needs teacher  Tobacco Use   Smoking status: Never   Smokeless tobacco: Never  Vaping Use   Vaping status: Never Used  Substance and Sexual Activity   Alcohol use: No   Drug use: No   Sexual activity: Yes    Partners: Male    Birth control/protection: Post-menopausal  Other Topics Concern   Not on file  Social History Narrative   Married.   One son is 51, lives in [place].   Retired from Agricultural consultant.   Enjoys substitute teaching, gardening, cooking.   Social Drivers of Corporate investment banker Strain: Low Risk  (07/30/2022)   Overall Financial Resource Strain (CARDIA)    Difficulty of Paying  Living Expenses: Not hard at all  Food Insecurity: No Food Insecurity (07/30/2022)   Hunger Vital Sign    Worried About Running Out of Food in the Last Year: Never true    Ran Out of Food in the Last Year: Never true  Transportation Needs: No Transportation Needs (07/30/2022)   PRAPARE - Administrator, Civil Service (Medical): No    Lack of Transportation (Non-Medical): No  Physical Activity: Unknown (07/30/2022)   Exercise Vital Sign    Days of Exercise per Week: 0 days    Minutes of Exercise per Session: Not on file  Stress: Stress Concern Present (07/30/2022)   [employer] of Occupational Health - Occupational Stress Questionnaire    Feeling of Stress : To some extent  Social Connections: Unknown (07/30/2022)   Social Connection and Isolation Panel [NHANES]    Frequency of Communication with Friends and Family: Not on file    Frequency of Social Gatherings with Friends and Family: Not on file    Attends Religious Services: Not on file    Active Member of Clubs or Organizations: Not on file    Attends Banker Meetings: Not on file    Marital Status: Married    Allergies:  Allergies  Allergen Reactions   Chlorine Other (See Comments)    Smell causes lightheadedness.   Dilaudid [Hydromorphone]     hallucinations   Codeine Anxiety   Darvon Anxiety   Hydrocodone Anxiety   Meperidine Anxiety   Oxycodone Anxiety   Tramadol Anxiety   Victoza [Liraglutide] Nausea Only and Other (See Comments)    And weakness    Metabolic Disorder Labs: Lab Results  Component Value Date   HGBA1C 5.9 (H) 07/28/2022   MPG 120 01/09/2016   MPG 131 (H) 09/10/2014   No results found for: "PROLACTIN" Lab Results  Component Value Date   CHOL 211 (H) 03/10/2021   TRIG 266.0 (H) 03/10/2021   HDL 57.40 03/10/2021   CHOLHDL 4 03/10/2021   VLDL 53.2 (H) 03/10/2021   LDLCALC 60 04/15/2020   LDLCALC 118 (H) 07/09/2019   Lab Results  Component Value Date   TSH 1.180  12/02/2021   TSH 3.33 10/07/2020    Therapeutic Level Labs: No results found for: "LITHIUM" No results found for: "VALPROATE" No results found for: "CBMZ"  Current Medications: Current Outpatient Medications  Medication Sig Dispense Refill   Brexpiprazole (REXULTI) 0.5  MG TABS Take 1 tablet (0.5 mg total) by mouth daily. 30 tablet 0   doxycycline (PERIOSTAT) 20 MG tablet Take 20 mg by mouth 2 (two) times daily.     DULoxetine (CYMBALTA) 30 MG capsule Take 30 mg by mouth daily.     DULoxetine (CYMBALTA) 60 MG capsule Take 60 mg by mouth every morning. Takes with her 30mg  capsule to equal 90 mg daily     famotidine (PEPCID) 20 MG tablet TAKE 1 TABLET (20 MG TOTAL) BY MOUTH DAILY. FOR HEARTBURN. 90 tablet 3   Multiple Vitamin (MULTIVITAMIN) tablet Take 1 tablet by mouth daily.     NUPLAZID 34 MG CAPS Take 1 capsule by mouth daily.     pantoprazole (PROTONIX) 40 MG tablet TAKE 1 TABLET (40 MG TOTAL) BY MOUTH 2 (TWO) TIMES DAILY BEFORE A MEAL. FOR HEARTBURN. 180 tablet 1   propranolol (INDERAL) 20 MG tablet TAKE 1 TABLET (20 MG TOTAL) BY MOUTH 3 (THREE) TIMES DAILY AS NEEDED. FOR HEARTRATE >110 BPM 90 tablet 2   spironolactone (ALDACTONE) 25 MG tablet Take 1 tablet (25 mg total) by mouth daily. 90 tablet 3   SUMAtriptan (IMITREX) 100 MG tablet Take 1 tablet by mouth at migraine onset. Do not exceed 100 mg in 24 hours. 10 tablet 0   tamsulosin (FLOMAX) 0.4 MG CAPS capsule Take 0.4 mg by mouth.     topiramate (TOPAMAX) 50 MG tablet Take 1 tablet by mouth every morning and 2 tablets at night for migraine prevention. 270 tablet 1   No current facility-administered medications for this visit.     Musculoskeletal: Strength & Muscle Tone:  Normal Gait & Station: normal Patient leans: N/A  Psychiatric Specialty Exam: Review of Systems  There were no vitals taken for this visit.There is no height or weight on file to calculate BMI.  General Appearance: {Appearance:22683}  Eye Contact:  {BHH  EYE CONTACT:22684}  Speech:  Clear and Coherent  Volume:  Normal  Mood:  {BHH MOOD:22306}  Affect:  {Affect (PAA):22687}  Thought Process:  Coherent  Orientation:  Full (Time, Place, and Person)  Thought Content: Logical   Suicidal Thoughts:  {ST/HT (PAA):22692}  Homicidal Thoughts:  {ST/HT (PAA):22692}  Memory:  Immediate;   Good  Judgement:  {Judgement (PAA):22694}  Insight:  {Insight (PAA):22695}  Psychomotor Activity:  Normal  Concentration:  Concentration: Good and Attention Span: Good  Recall:  Good  Fund of Knowledge: Good  Language: Good  Akathisia:  No  Handed:  Right  AIMS (if indicated): not done  Assets:  Communication Skills Desire for Improvement  ADL's:  Intact  Cognition: WNL  Sleep:  {BHH GOOD/FAIR/POOR:22877}   Screenings: GAD-7    Loss adjuster, chartered Office Visit from 04/21/2023 in [facility] Office Visit from 03/22/2023 in [facility] [place] Office Visit from 02/08/2023 in [facility] Office Visit from 09/20/2022 in [facility]  Total GAD-7 Score 13 9 6 8       Mini-Mental    Flowsheet Row Clinical Support from 07/06/2018 in [facility]  Total Score (max 30 points ) 20      PHQ2-9    Flowsheet Row Office Visit from 04/21/2023 in [facility] Office Visit from 04/12/2023 in [facility] Office Visit from 03/22/2023 in [facility] [place] Office Visit from 02/08/2023 in  [facility] Visit from 09/20/2022 in [facility] [place]  PHQ-2 Total Score 2 2 2 3 2   PHQ-9 Total Score 11 9 8 9 5       Flowsheet Row ED from 06/04/2023 in [facility] at [facility] ED from 12/02/2021 in [facility] Emergency Department  at [facility] ED from 05/11/2021 in [facility]   C-SSRS RISK CATEGORY No Risk No Risk No Risk        Assessment and Plan:  GLORIANN YONKE is a 72 y.o. year old female with a history of depression, anxiety, mild cognitive impairment, who is referred for anxiety and depression.    1. Delusion (HCC) 2. Neurocognitive disorder Functional Status   IADL: Independent in the following: managing finances, medications, driving           Requires assistance with the following:  ADL  Independent in the following: bathing and hygiene, feeding, continence, grooming and toileting, walking          Requires assistance with the following: Folate, Vitamin B12, TSH Images  PET CT brain metabolic evaluation showed hypometabolism involving both cerebral hemispheres, basal ganglia and brainstem, MRI of the brain mild generalized parenchymal volume loss, without any particular lobar predominance, mild small vessel disease Neuropsych assessment:  Etiology:   Exam is notable for rumination on delusion, slightly pressured speech, although she is calm, redirectable, and oriented otherwise. The differential diagnosis includes delusion secondary to neurocognitive disorder, medication-induced mania (as she was recently on mirtazapine), and bipolar disorder (she has a family history of bipolar), although the onset is not typical.    She had adverse reaction from quetiapine, trazodone, and would not be a good candidate at this time for clozapine as it requires frequent blood monitoring. Will start olanzapine to target delusion, insomnia and appetite loss.  Although it is not preferable to use along with nuplazid, she is already on maximum dose of this medication, and it has limited benefit.  Will consider monotherapy if she has good benefit from olanzapine.  This potential risk of QTc prolongation, EPS/parkinsonism, increased mortality with people with dementia.  Will obtain lab to rule  out any medical health issues contributing to her symptoms. (She would like urinalysis to be obtained- will order this to rule out delirium.) She will keep her appointment with neurologist for further evaluation.    3. Anxiety disorder, unspecified type Unchanged. She reports anxiety for many years, which has been significantly worsening since March due to issues with her husband.  Will intervene as outlined above.  Noted that she has chest pain and she is undergoing evaluation with her PCP.     # weight loss According to the chart review, she was seen by a gastroenterologist in 2023. "Last colonoscopy October 2018 with finding of a 9 mm polyp in the ascending colon which was a tubular adenoma and noted multiple diverticuli." She had EGD with dilation in Sept 2023, although the detailed reports are not available.  Although mood symptoms can suddenly close weight loss, we will examine also out any medical health issues contributing to it.    Plan Continue duloxetine 90 mg daily  Start olanzapine 2.5 mg at night Hold Trazodone Obtain lab (TSH, folate), urine drug screening Next appointment: 11/21 at 8:30, IP  Obtain ROI for 2 way conversation with her son, Lawrence Santiago [phone]  - on nuplazid 34 mg daily    Past trials:  quetiapine- nightmares, trazodone- headache   The patient demonstrates the following risk factors for suicide: Chronic risk factors for suicide include: psychiatric disorder of depression . Acute risk factors for suicide include: family or marital conflict and unemployment. Protective factors for this patient include: positive social support and hope for the future. Considering these factors, the overall suicide risk at this point appears to be low. Patient is appropriate for outpatient follow up.     Collaboration of Care: Collaboration of Care: {BH OP Collaboration of Care:21014065}  Patient/Guardian was advised Release of Information must be obtained prior to any record  release in order to collaborate their care with an outside provider. Patient/Guardian was advised if they have not already done so to contact the registration department to sign all necessary forms in order for [place] to release information regarding their care.   Consent: Patient/Guardian gives verbal consent for treatment and assignment of benefits for services provided during this visit. Patient/Guardian expressed understanding and agreed to proceed.    Neysa Hotter, MD 07/05/2023, 6:04 PM

## 2023-07-06 ENCOUNTER — Ambulatory Visit: Payer: Medicare Other

## 2023-07-06 ENCOUNTER — Ambulatory Visit (HOSPITAL_BASED_OUTPATIENT_CLINIC_OR_DEPARTMENT_OTHER): Payer: No Typology Code available for payment source | Admitting: Student

## 2023-07-07 ENCOUNTER — Ambulatory Visit: Payer: Medicare Other | Admitting: Psychiatry

## 2023-07-07 ENCOUNTER — Telehealth: Payer: Self-pay | Admitting: Neurology

## 2023-07-07 ENCOUNTER — Telehealth: Payer: Self-pay

## 2023-07-07 NOTE — Telephone Encounter (Signed)
Pt called to confirm appointment.

## 2023-07-07 NOTE — Telephone Encounter (Signed)
This encounter was created in error - please disregard.

## 2023-07-07 NOTE — Telephone Encounter (Signed)
Lmtrc 1st attempt. Pt needs to be educated on how to see office notes from the [place] perspective:

## 2023-07-07 NOTE — Telephone Encounter (Signed)
error 

## 2023-07-11 NOTE — Telephone Encounter (Signed)
Lmtrc. 2nd attempt.

## 2023-07-11 NOTE — Telephone Encounter (Signed)
Returned call to pt and they stated they deactivated mychart due to someone getting access to it that she didn't want having it. I mailed off her office notes

## 2023-07-15 ENCOUNTER — Telehealth: Payer: Self-pay | Admitting: Neurology

## 2023-07-15 ENCOUNTER — Ambulatory Visit: Payer: Self-pay | Admitting: Urology

## 2023-07-15 NOTE — Telephone Encounter (Signed)
Pt canceled EEG appt and will call back to r/s

## 2023-07-19 ENCOUNTER — Ambulatory Visit: Payer: Medicare Other

## 2023-07-22 ENCOUNTER — Other Ambulatory Visit: Payer: Medicare Other

## 2023-07-25 ENCOUNTER — Other Ambulatory Visit: Payer: Medicare Other

## 2023-07-25 ENCOUNTER — Other Ambulatory Visit: Payer: Self-pay | Admitting: Primary Care

## 2023-07-25 DIAGNOSIS — E538 Deficiency of other specified B group vitamins: Secondary | ICD-10-CM

## 2023-07-26 ENCOUNTER — Ambulatory Visit (INDEPENDENT_AMBULATORY_CARE_PROVIDER_SITE_OTHER): Payer: Medicare Other

## 2023-07-26 ENCOUNTER — Other Ambulatory Visit (INDEPENDENT_AMBULATORY_CARE_PROVIDER_SITE_OTHER): Payer: Medicare Other

## 2023-07-26 ENCOUNTER — Telehealth: Payer: Self-pay | Admitting: Primary Care

## 2023-07-26 DIAGNOSIS — E538 Deficiency of other specified B group vitamins: Secondary | ICD-10-CM

## 2023-07-26 DIAGNOSIS — D509 Iron deficiency anemia, unspecified: Secondary | ICD-10-CM | POA: Diagnosis not present

## 2023-07-26 LAB — IBC + FERRITIN
Ferritin: 93.1 ng/mL (ref 10.0–291.0)
Iron: 71 ug/dL (ref 42–145)
Saturation Ratios: 22.2 % (ref 20.0–50.0)
TIBC: 319.2 ug/dL (ref 250.0–450.0)
Transferrin: 228 mg/dL (ref 212.0–360.0)

## 2023-07-26 LAB — VITAMIN B12: Vitamin B-12: 293 pg/mL (ref 211–911)

## 2023-07-26 MED ORDER — CYANOCOBALAMIN 1000 MCG/ML IJ SOLN
1000.0000 ug | Freq: Once | INTRAMUSCULAR | Status: AC
  Administered 2023-07-26: 1000 ug via INTRAMUSCULAR

## 2023-07-26 NOTE — Telephone Encounter (Signed)
 Yes, of course. I will add orders as future.

## 2023-07-26 NOTE — Telephone Encounter (Signed)
 Pt was in to have her B12 checked. Asked if she could also have an Iron panel done also.   Thanks

## 2023-07-26 NOTE — Progress Notes (Signed)
 Per orders of Mallie Gaskins, NP, injection of B12 1000 mcg/ml given by Clotilda SHAUNNA Pander, CMA in Right Deltoid. Patient tolerated injection well.  Pt had labs prior to injection. She will hold off on scheduling next B12 injection based on the labs done today. We discussed teaching her or her husband how to give her her B12 injection since it is hard to come in sometimes. She has never been able to complete a cycle of weekly injections.

## 2023-07-27 ENCOUNTER — Other Ambulatory Visit: Payer: Medicare Other | Admitting: *Deleted

## 2023-07-27 ENCOUNTER — Telehealth: Payer: Self-pay

## 2023-07-27 NOTE — Telephone Encounter (Signed)
 Please advise her to continue with her current medication for now, as I have not had the opportunity to assess her progress since the medication change.

## 2023-07-27 NOTE — Telephone Encounter (Signed)
 pt called states that she is still having anxiety and not sleeping. pt states that she is taking the rexulti and the cymbalta and wanted to know if she could go up on either one of those medications. pt was last seen on 10-15 next appt 1-13

## 2023-07-28 ENCOUNTER — Ambulatory Visit: Payer: Self-pay | Admitting: Urology

## 2023-07-28 NOTE — Telephone Encounter (Signed)
 left message with directions per providers orders

## 2023-07-29 ENCOUNTER — Telehealth: Payer: Self-pay

## 2023-07-29 NOTE — Telephone Encounter (Signed)
 I left v/m for pt to cb per 07/27/23 lab result instructions from MARLA Gaskins NP; pt is to come every 2 wk for 1 month then monthly for 6 months for vitamin b 12 injections. Pt had last vitamin b 12 injection on 07/26/23 and pt is scheduled on NV 08/02/23 that needs to be rescheduled for 2 wks from 07/26/23. Sending note to triage.

## 2023-07-29 NOTE — Telephone Encounter (Signed)
 Copied from CRM [id number]. Topic: Clinical - Lab/Test Results >> Jul 29, 2023 11:28 AM Joanell NOVAK wrote: Reason for CRM: Pt stated that she received a call from [facility] and would like to ask for a callback, she said she has received 3 calls so far regarding her results so far

## 2023-07-29 NOTE — Telephone Encounter (Signed)
 I spoke with pt and appt rescheduled for NV 08/08/22 at 3 pm. Nothing further needed at this time.

## 2023-07-29 NOTE — Progress Notes (Deleted)
 [facility] MD/PA/NP OP Progress Note  07/29/2023 4:44 PM Katherine [name]  MRN:  [id number]  Chief Complaint: No chief complaint on file.  HPI: ***  Rexulti    Visit Diagnosis: No diagnosis found.  Past Psychiatric History: Please see initial evaluation for full details. I have reviewed the history. No updates at this time.     Past Medical History:  Past Medical History:  Diagnosis Date   Anemia 2018   Anxiety    Arthritis    Borderline diabetes    Cataract    Chest pain    a. 2007 Cath: nl cors.  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 03/2022 MV: EF >65%, no ischemia/infarct. No signif cor/Ao Ca2+. Low risk.   Chicken pox    age 24   Chronic back pain    Compressed discs. [facility]   Depression    Diastolic dysfunction    a. 06/2020 Echo: EF 60-65%, no rwma, GrI DD, nl RV size/fxn. RVSP 30.5 mmHg.   GERD (gastroesophageal reflux disease)    High cholesterol    Hypertension    Hypokalemia    Migraine headache    Obesity    Parkinson's disease (HCC)    Pre-diabetes    okay now    Sinus tachycardia    a. 02/2022 Zio: predominantly sinus rhythm, avg of 99 bpm (77-134). Rare PACs/PVCs. Triggered events = sinus rhythm.    Past Surgical History:  Procedure Laterality Date   BLADDER SUSPENSION  2005   CARDIAC CATHETERIZATION     CESAREAN SECTION     CLOSED REDUCTION NASAL FRACTURE N/A 01/11/2020   Procedure: CLOSED REDUCTION NASAL FRACTURE;  Surgeon: Herminio Miu, MD;  Location: [facility];  Service: ENT;  Laterality: N/A;   ENDOMETRIAL ABLATION  2007   FOOT SURGERY     bilateral bunions   LUMBAR LAMINECTOMY/DECOMPRESSION MICRODISCECTOMY Bilateral 01/12/2016   Procedure: Bilateral L4-5 Laminotomy/Foraminotomy;  Surgeon: Reyes Budge, MD;  Location: [facility];  Service: Neurosurgery;  Laterality: Bilateral;  Bilateral L4-5 Laminotomy/Foraminotomy   TONSILLECTOMY  1971    Family  Psychiatric History: Please see initial evaluation for full details. I have reviewed the history. No updates at this time.     Family History:  Family History  Problem Relation Age of Onset   Depression Mother    Heart disease Mother    Hypertension Mother    Heart failure Mother        CHF   Diabetes Mother    Cancer Mother        CERVICAL CANCER   Atrial fibrillation Mother    Bipolar disorder Father    Heart disease Father    Diabetes Father    Rashes / Skin problems Father    Hypertension Father    Cirrhosis Father        liver disease non alcoholic   Bipolar disorder Sister    Diabetes Sister    Breast cancer Sister 87   Diabetes Mellitus II Sister    Heart disease Brother    Heart failure Brother        CHF   Diabetes Brother    Cancer Brother 38       THROAT AND NECK   Esophageal cancer Brother    Lung cancer Brother    Atrial fibrillation Brother    Cancer Maternal Grandfather    Colon cancer Neg Hx    Stomach cancer Neg Hx  Rectal cancer Neg Hx     Social History:  Social History   Socioeconomic History   Marital status: Married    Spouse name: Not on file   Number of children: 1   Years of education: Not on file   Highest education level: Not on file  Occupational History   Occupation: Magazine Features Editor: [employer]    Comment: retired-special needs teacher  Tobacco Use   Smoking status: Never   Smokeless tobacco: Never  Vaping Use   Vaping status: Never Used  Substance and Sexual Activity   Alcohol use: No   Drug use: No   Sexual activity: Yes    Partners: Male    Birth control/protection: Post-menopausal  Other Topics Concern   Not on file  Social History Narrative   Married.   One son is 45, lives in [place].   Retired from agricultural consultant.   Enjoys substitute teaching, gardening, cooking.   Social Drivers of Corporate Investment Banker Strain: Low Risk  (07/30/2022)   Overall Financial Resource Strain (CARDIA)     Difficulty of Paying Living Expenses: Not hard at all  Food Insecurity: No Food Insecurity (07/30/2022)   Hunger Vital Sign    Worried About Running Out of Food in the Last Year: Never true    Ran Out of Food in the Last Year: Never true  Transportation Needs: No Transportation Needs (07/30/2022)   PRAPARE - Administrator, Civil Service (Medical): No    Lack of Transportation (Non-Medical): No  Physical Activity: Unknown (07/30/2022)   Exercise Vital Sign    Days of Exercise per Week: 0 days    Minutes of Exercise per Session: Not on file  Stress: Stress Concern Present (07/30/2022)   [employer] of Occupational Health - Occupational Stress Questionnaire    Feeling of Stress : To some extent  Social Connections: Unknown (07/30/2022)   Social Connection and Isolation Panel [NHANES]    Frequency of Communication with Friends and Family: Not on file    Frequency of Social Gatherings with Friends and Family: Not on file    Attends Religious Services: Not on file    Active Member of Clubs or Organizations: Not on file    Attends Banker Meetings: Not on file    Marital Status: Married    Allergies:  Allergies  Allergen Reactions   Chlorine Other (See Comments)    Smell causes lightheadedness.   Dilaudid  Epimetheus.fails ]     hallucinations   Codeine Anxiety   Darvon Anxiety   Hydrocodone Anxiety   Meperidine Anxiety   Oxycodone Anxiety   Tramadol  Anxiety   Victoza  [Liraglutide ] Nausea Only and Other (See Comments)    And weakness    Metabolic Disorder Labs: Lab Results  Component Value Date   HGBA1C 5.9 (H) 07/28/2022   MPG 120 01/09/2016   MPG 131 (H) 09/10/2014   No results found for: PROLACTIN Lab Results  Component Value Date   CHOL 211 (H) 03/10/2021   TRIG 266.0 (H) 03/10/2021   HDL 57.40 03/10/2021   CHOLHDL 4 03/10/2021   VLDL 53.2 (H) 03/10/2021   LDLCALC 60 04/15/2020   LDLCALC 118 (H) 07/09/2019   Lab Results  Component  Value Date   TSH 1.180 12/02/2021   TSH 3.33 10/07/2020    Therapeutic Level Labs: No results found for: LITHIUM No results found for: VALPROATE No results found for: CBMZ  Current Medications: Current Outpatient Medications  Medication  Sig Dispense Refill   Brexpiprazole  (REXULTI ) 0.5 MG TABS Take 1 tablet (0.5 mg total) by mouth daily. 30 tablet 0   doxycycline (PERIOSTAT) 20 MG tablet Take 20 mg by mouth 2 (two) times daily.     DULoxetine  (CYMBALTA ) 30 MG capsule Take 30 mg by mouth daily.     DULoxetine  (CYMBALTA ) 60 MG capsule Take 60 mg by mouth every morning. Takes with her 30mg  capsule to equal 90 mg daily     famotidine  (PEPCID ) 20 MG tablet TAKE 1 TABLET (20 MG TOTAL) BY MOUTH DAILY. FOR HEARTBURN. 90 tablet 3   Multiple Vitamin (MULTIVITAMIN) tablet Take 1 tablet by mouth daily.     NUPLAZID  34 MG CAPS Take 1 capsule by mouth daily.     pantoprazole  (PROTONIX ) 40 MG tablet TAKE 1 TABLET (40 MG TOTAL) BY MOUTH 2 (TWO) TIMES DAILY BEFORE A MEAL. FOR HEARTBURN. 180 tablet 1   propranolol  (INDERAL ) 20 MG tablet TAKE 1 TABLET (20 MG TOTAL) BY MOUTH 3 (THREE) TIMES DAILY AS NEEDED. FOR HEARTRATE >110 BPM 90 tablet 2   spironolactone  (ALDACTONE ) 25 MG tablet Take 1 tablet (25 mg total) by mouth daily. 90 tablet 3   SUMAtriptan  (IMITREX ) 100 MG tablet Take 1 tablet by mouth at migraine onset. Do not exceed 100 mg in 24 hours. 10 tablet 0   tamsulosin (FLOMAX) 0.4 MG CAPS capsule Take 0.4 mg by mouth.     topiramate  (TOPAMAX ) 50 MG tablet Take 1 tablet by mouth every morning and 2 tablets at night for migraine prevention. 270 tablet 1   No current facility-administered medications for this visit.     Musculoskeletal: Strength & Muscle Tone: within normal limits Gait & Station: normal Patient leans: N/A  Psychiatric Specialty Exam: Review of Systems  There were no vitals taken for this visit.There is no height or weight on file to calculate BMI.  General Appearance:  Well Groomed  Eye Contact:  Good  Speech:  Clear and Coherent  Volume:  Normal  Mood:  {BHH MOOD:22306}  Affect:  {Affect (PAA):22687}  Thought Process:  Coherent  Orientation:  Full (Time, Place, and Person)  Thought Content: Logical   Suicidal Thoughts:  {ST/HT (PAA):22692}  Homicidal Thoughts:  {ST/HT (PAA):22692}  Memory:  Immediate;   Good  Judgement:  {Judgement (PAA):22694}  Insight:  {Insight (PAA):22695}  Psychomotor Activity:  Normal  Concentration:  Concentration: Good and Attention Span: Good  Recall:  Good  Fund of Knowledge: Good  Language: Good  Akathisia:  No  Handed:  Right  AIMS (if indicated): not done  Assets:  Communication Skills Desire for Improvement  ADL's:  Intact  Cognition: WNL  Sleep:  {BHH GOOD/FAIR/POOR:22877}   Screenings: GAD-7    Loss Adjuster, Chartered Office Visit from 04/21/2023 in [facility] Office Visit from 03/22/2023 in [facility] [place] Office Visit from 02/08/2023 in [facility] Visit from 09/20/2022 in [facility]  Total GAD-7 Score 13 9 6 8       Mini-Mental    Flowsheet Row Clinical Support from 07/06/2018 in [facility]  Total Score (max 30 points ) 20      PHQ2-9    Flowsheet Row Office Visit from 04/21/2023 in [facility] Office Visit from 04/12/2023 in [facility] Office Visit from 03/22/2023 in [facility]  at [facility] Visit from 02/08/2023 in [facility] Office Visit from 09/20/2022 in [facility] [place]  PHQ-2 Total Score 2 2 2 3 2   PHQ-9 Total Score 11 9 8 9 5       Flowsheet Row ED from 06/04/2023 in [facility] at [facility] ED from 12/02/2021 in [facility]  Emergency Department at [facility] ED from 05/11/2021 in [facility]   C-SSRS RISK CATEGORY No Risk No Risk No Risk        Assessment and Plan:  Katherine [name] is a 73 y.o. year old female with a history of depression, anxiety, mild cognitive impairment, who is referred for anxiety and depression.    1. Delusion (HCC) 2. Neurocognitive disorder Functional Status   IADL: Independent in the following: managing finances, medications, driving           Requires assistance with the following:  ADL  Independent in the following: bathing and hygiene, feeding, continence, grooming and toileting, walking          Requires assistance with the following: Folate, Vitamin B12, TSH Images  PET CT brain metabolic evaluation showed hypometabolism involving both cerebral hemispheres, basal ganglia and brainstem, MRI of the brain mild generalized parenchymal volume loss, without any particular lobar predominance, mild small vessel disease Neuropsych assessment:  Etiology:   Exam is notable for rumination on delusion, slightly pressured speech, although she is calm, redirectable, and oriented otherwise. The differential diagnosis includes delusion secondary to neurocognitive disorder, medication-induced mania (as she was recently on mirtazapine ), and bipolar disorder (she has a family history of bipolar), although the onset is not typical.    She had adverse reaction from quetiapine , trazodone , and would not be a good candidate at this time for clozapine as it requires frequent blood monitoring. Will start olanzapine  to target delusion, insomnia and appetite loss.  Although it is not preferable to use along with nuplazid , she is already on maximum dose of this medication, and it has limited benefit.  Will consider monotherapy if she has good benefit from olanzapine .  This potential risk of QTc prolongation, EPS/parkinsonism, increased mortality with people with dementia.   Will obtain lab to rule out any medical health issues contributing to her symptoms. (She would like urinalysis to be obtained- will order this to rule out delirium.) She will keep her appointment with neurologist for further evaluation.    3. Anxiety disorder, unspecified type Unchanged. She reports anxiety for many years, which has been significantly worsening since March due to issues with her husband.  Will intervene as outlined above.  Noted that she has chest pain and she is undergoing evaluation with her PCP.     # weight loss According to the chart review, she was seen by a gastroenterologist in 2023. Last colonoscopy October 2018 with finding of a 9 mm polyp in the ascending colon which was a tubular adenoma and noted multiple diverticuli. She had EGD with dilation in Sept 2023, although the detailed reports are not available.  Although mood symptoms can suddenly close weight loss, we will examine also out any medical health issues contributing to it.    Plan Continue duloxetine  90 mg daily  Start olanzapine  2.5 mg at night Hold Trazodone  Obtain lab (TSH, folate), urine drug screening Next appointment: 11/21 at 8:30, IP  Obtain ROI for 2 way conversation with her son, Glendia Smalls [phone]  - on nuplazid   34 mg daily    Past Trials: Abilify , rexulti ,  olanzapine  (caused tingling), quetiapine . I would avoid trying clozapine due to the potential side effects experienced with olanzapine . Although she might benefit from lumateprone, it is cost-prohibitive.    The patient demonstrates the following risk factors for suicide: Chronic risk factors for suicide include: psychiatric disorder of depression . Acute risk factors for suicide include: family or marital conflict and unemployment. Protective factors for this patient include: positive social support and hope for the future. Considering these factors, the overall suicide risk at this point appears to be low. Patient is appropriate for  outpatient follow up.     Collaboration of Care: Collaboration of Care: {BH OP Collaboration of Care:21014065}  Patient/Guardian was advised Release of Information must be obtained prior to any record release in order to collaborate their care with an outside provider. Patient/Guardian was advised if they have not already done so to contact the registration department to sign all necessary forms in order for us  to release information regarding their care.   Consent: Patient/Guardian gives verbal consent for treatment and assignment of benefits for services provided during this visit. Patient/Guardian expressed understanding and agreed to proceed.    Katheren Sleet, MD 07/29/2023, 4:44 PM

## 2023-07-31 ENCOUNTER — Other Ambulatory Visit: Payer: Self-pay | Admitting: Psychiatry

## 2023-08-01 ENCOUNTER — Ambulatory Visit (INDEPENDENT_AMBULATORY_CARE_PROVIDER_SITE_OTHER): Payer: Medicare Other | Admitting: *Deleted

## 2023-08-01 ENCOUNTER — Ambulatory Visit: Payer: Medicare Other | Admitting: Psychiatry

## 2023-08-01 VITALS — Ht 62.0 in | Wt 144.0 lb

## 2023-08-01 DIAGNOSIS — Z0000 Encounter for general adult medical examination without abnormal findings: Secondary | ICD-10-CM | POA: Diagnosis not present

## 2023-08-01 NOTE — Patient Instructions (Signed)
 Ms. Widmann , Thank you for taking time to come for your Medicare Wellness Visit. I appreciate your ongoing commitment to your health goals. Please review the following plan we discussed and let me know if I can assist you in the future.   Referrals/Orders/Follow-Ups/Clinician Recommendations: Remember to call and schedule your mammogram and colonoscopy.  This is a list of the screening recommended for you and due dates:  Health Maintenance  Topic Date Due   Mammogram  06/01/2020   Colon Cancer Screening  04/22/2022   Pneumonia Vaccine (2 of 2 - PCV) 04/30/2022   Medicare Annual Wellness Visit  07/31/2024   DTaP/Tdap/Td vaccine (3 - Td or Tdap) 02/07/2030   Flu Shot  Completed   DEXA scan (bone density measurement)  Completed   Hepatitis C Screening  Completed   Zoster (Shingles) Vaccine  Completed   HPV Vaccine  Aged Out   COVID-19 Vaccine  Discontinued    Advanced directives: (Declined) Advance directive discussed with you today. Even though you declined this today, please call our office should you change your mind, and we can give you the proper paperwork for you to fill out.  Next Medicare Annual Wellness Visit scheduled for next year: Yes 08/03/24 @ 11:30

## 2023-08-01 NOTE — Telephone Encounter (Signed)
 Could you contact her to schedule a follow-up appointment so I can ensure there's enough medication to last until her next visit? She has had many cancellations and no-shows-please let us know if there's anything we can do to improve this.

## 2023-08-01 NOTE — Progress Notes (Signed)
 Subjective:   Katherine [name] is a 73 y.o. female who presents for Medicare Annual (Subsequent) preventive examination.  Visit Complete: Virtual I connected with  Katherine [name] on 08/01/23 by a audio enabled telemedicine application and verified that I am speaking with the correct person using two identifiers.  Patient Location: Home  Provider Location: Office/Clinic  I discussed the limitations of evaluation and management by telemedicine. The patient expressed understanding and agreed to proceed.  Vital Signs: Because this visit was a virtual/telehealth visit, some criteria may be missing or patient reported. Any vitals not documented were not able to be obtained and vitals that have been documented are patient reported.   Cardiac Risk Factors include: advanced age (>40men, >69 women);dyslipidemia;hypertension     Objective:    Today's Vitals   08/01/23 1346 08/01/23 1347  Weight: 144 lb (65.3 kg)   Height: 5' 2 (1.575 m)   PainSc:  8    Body mass index is 26.34 kg/m.     08/01/2023    2:05 PM 06/03/2023    8:56 PM 07/30/2022    4:14 PM 03/17/2022    2:02 PM 12/02/2021   11:32 AM 11/11/2020   11:25 AM 01/11/2020    7:59 PM  Advanced Directives  Does Patient Have a Medical Advance Directive? No Yes No Yes No No No  Type of Advance Directive  Living will  Healthcare Power of [place];Living will     Does patient want to make changes to medical advance directive?  No - Patient declined       Copy of Healthcare Power of Attorney in Chart?    No - copy requested     Would patient like information on creating a medical advance directive? No - Patient declined  No - Patient declined   No - Patient declined     Current Medications (verified) Outpatient Encounter Medications as of 08/01/2023  Medication Sig   BIOTIN PO Take by mouth daily.   Brexpiprazole  (REXULTI ) 0.5 MG TABS Take 1 tablet (0.5 mg total) by mouth daily.   DULoxetine  (CYMBALTA ) 30 MG capsule Take 30  mg by mouth daily.   DULoxetine  (CYMBALTA ) 60 MG capsule Take 60 mg by mouth every morning. Takes with her 30mg  capsule to equal 90 mg daily   Multiple Vitamin (MULTIVITAMIN) tablet Take 1 tablet by mouth daily.   NUPLAZID  34 MG CAPS Take 1 capsule by mouth daily.   pantoprazole  (PROTONIX ) 40 MG tablet TAKE 1 TABLET (40 MG TOTAL) BY MOUTH 2 (TWO) TIMES DAILY BEFORE A MEAL. FOR HEARTBURN.   propranolol  (INDERAL ) 20 MG tablet TAKE 1 TABLET (20 MG TOTAL) BY MOUTH 3 (THREE) TIMES DAILY AS NEEDED. FOR HEARTRATE >110 BPM   spironolactone  (ALDACTONE ) 25 MG tablet Take 1 tablet (25 mg total) by mouth daily.   SUMAtriptan  (IMITREX ) 100 MG tablet Take 1 tablet by mouth at migraine onset. Do not exceed 100 mg in 24 hours.   tamsulosin (FLOMAX) 0.4 MG CAPS capsule Take 0.4 mg by mouth.   topiramate  (TOPAMAX ) 50 MG tablet Take 1 tablet by mouth every morning and 2 tablets at night for migraine prevention.   famotidine  (PEPCID ) 20 MG tablet TAKE 1 TABLET (20 MG TOTAL) BY MOUTH DAILY. FOR HEARTBURN. (Patient not taking: Reported on 08/01/2023)   [DISCONTINUED] doxycycline (PERIOSTAT) 20 MG tablet Take 20 mg by mouth 2 (two) times daily. (Patient not taking: Reported on 08/01/2023)   No facility-administered encounter medications on file as of 08/01/2023.  Allergies (verified) Chlorine, Dilaudid  [hydromorphone ], Codeine, Darvon, Hydrocodone, Meperidine, Oxycodone, Tramadol , and Victoza  [liraglutide ]   History: Past Medical History:  Diagnosis Date   Anemia 2018   Anxiety    Arthritis    Borderline diabetes    Cataract    Chest pain    a. 2007 Cath: nl cors.  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 03/2022 MV: EF >65%, no ischemia/infarct. No signif cor/Ao Ca2+. Low risk.   Chicken pox    age 30   Chronic back pain    Compressed discs. [facility]   Depression    Diastolic dysfunction    a. 06/2020 Echo: EF 60-65%, no rwma, GrI DD,  nl RV size/fxn. RVSP 30.5 mmHg.   GERD (gastroesophageal reflux disease)    High cholesterol    Hypertension    Hypokalemia    Migraine headache    Obesity    Parkinson's disease (HCC)    Pre-diabetes    okay now    Sinus tachycardia    a. 02/2022 Zio: predominantly sinus rhythm, avg of 99 bpm (77-134). Rare PACs/PVCs. Triggered events = sinus rhythm.   Past Surgical History:  Procedure Laterality Date   BLADDER SUSPENSION  2005   CARDIAC CATHETERIZATION     CESAREAN SECTION     CLOSED REDUCTION NASAL FRACTURE N/A 01/11/2020   Procedure: CLOSED REDUCTION NASAL FRACTURE;  Surgeon: Herminio Miu, MD;  Location: [facility];  Service: ENT;  Laterality: N/A;   ENDOMETRIAL ABLATION  2007   FOOT SURGERY     bilateral bunions   LUMBAR LAMINECTOMY/DECOMPRESSION MICRODISCECTOMY Bilateral 01/12/2016   Procedure: Bilateral L4-5 Laminotomy/Foraminotomy;  Surgeon: Reyes Budge, MD;  Location: [facility];  Service: Neurosurgery;  Laterality: Bilateral;  Bilateral L4-5 Laminotomy/Foraminotomy   TONSILLECTOMY  1971   Family History  Problem Relation Age of Onset   Depression Mother    Heart disease Mother    Hypertension Mother    Heart failure Mother        CHF   Diabetes Mother    Cancer Mother        CERVICAL CANCER   Atrial fibrillation Mother    Bipolar disorder Father    Heart disease Father    Diabetes Father    Rashes / Skin problems Father    Hypertension Father    Cirrhosis Father        liver disease non alcoholic   Bipolar disorder Sister    Diabetes Sister    Breast cancer Sister 61   Diabetes Mellitus II Sister    Heart disease Brother    Heart failure Brother        CHF   Diabetes Brother    Cancer Brother 57       THROAT AND NECK   Esophageal cancer Brother    Lung cancer Brother    Atrial fibrillation Brother    Cancer Maternal Grandfather    Colon cancer Neg Hx    Stomach cancer Neg Hx    Rectal cancer Neg Hx    Social History    Socioeconomic History   Marital status: Married    Spouse name: Not on file   Number of children: 1   Years of education: Not on file   Highest education level: Not on file  Occupational History   Occupation: Magazine Features Editor: [employer]    Comment: retired-special needs teacher  Tobacco Use   Smoking status:  Never   Smokeless tobacco: Never  Vaping Use   Vaping status: Never Used  Substance and Sexual Activity   Alcohol use: No   Drug use: No   Sexual activity: Yes    Partners: Male    Birth control/protection: Post-menopausal  Other Topics Concern   Not on file  Social History Narrative   Married.   One son is 5, lives in [place].   Retired from agricultural consultant.   Enjoys substitute teaching, gardening, cooking.   Social Drivers of Corporate Investment Banker Strain: Low Risk  (08/01/2023)   Overall Financial Resource Strain (CARDIA)    Difficulty of Paying Living Expenses: Not hard at all  Food Insecurity: No Food Insecurity (08/01/2023)   Hunger Vital Sign    Worried About Running Out of Food in the Last Year: Never true    Ran Out of Food in the Last Year: Never true  Transportation Needs: No Transportation Needs (08/01/2023)   PRAPARE - Administrator, Civil Service (Medical): No    Lack of Transportation (Non-Medical): No  Physical Activity: Inactive (08/01/2023)   Exercise Vital Sign    Days of Exercise per Week: 0 days    Minutes of Exercise per Session: 0 min  Stress: No Stress Concern Present (08/01/2023)   [employer] of Occupational Health - Occupational Stress Questionnaire    Feeling of Stress : Only a little  Social Connections: Moderately Integrated (08/01/2023)   Social Connection and Isolation Panel [NHANES]    Frequency of Communication with Friends and Family: More than three times a week    Frequency of Social Gatherings with Friends and Family: Once a week    Attends Religious Services: More than 4 times per year     Active Member of [employer] or Organizations: No    Attends Engineer, Structural: Never    Marital Status: Married    Tobacco Counseling Counseling given: Not Answered   Clinical Intake:  Pre-visit preparation completed: Yes  Pain : 0-10 Pain Score: 8  Pain Type: Acute pain (broke her left arm) Pain Location: Arm Pain Orientation: Left Pain Descriptors / Indicators: Aching Pain Onset: More than a month ago Pain Frequency: Constant     BMI - recorded: 26.34 Nutritional Status: BMI 25 -29 Overweight Nutritional Risks: Unintentional weight loss (has discussed with her doctors) Diabetes: No  How often do you need to have someone help you when you read instructions, pamphlets, or other written materials from your doctor or pharmacy?: 1 - Never  Interpreter Needed?: No  Information entered by :: R. Stanley Lyness LPN   Activities of Daily Living    08/01/2023    1:51 PM  In your present state of health, do you have any difficulty performing the following activities:  Hearing? 1  Vision? 0  Comment readers  Difficulty concentrating or making decisions? 0  Walking or climbing stairs? 0  Dressing or bathing? 0  Doing errands, shopping? 0  Preparing Food and eating ? N  Using the Toilet? N  In the past six months, have you accidently leaked urine? N  Do you have problems with loss of bowel control? N  Managing your Medications? N  Managing your Finances? N  Housekeeping or managing your Housekeeping? N    Patient Care Team: Gretta Comer POUR, NP as PCP - General (Nurse Practitioner) Perla Evalene PARAS, MD as PCP - Cardiology (Cardiology) Perla Evalene PARAS, MD as Consulting Physician (Cardiology) Nieves Cough, MD as Consulting Physician (  Urology) Darden Planas, NP as Nurse Practitioner Fate, Morna SAILOR, [facility] (Inactive) as Pharmacist (Pharmacist) Rennie Cindy SAUNDERS, MD as Consulting Physician (Internal Medicine)  Indicate any recent Medical Services you may  have received from other than [facility] providers in the past year (date may be approximate).     Assessment:   This is a routine wellness examination for [place].  Hearing/Vision screen Hearing Screening - Comments:: Some issues Vision Screening - Comments:: readers   Goals Addressed             This Visit's Progress    Patient Stated       Wants to try to increase her appetite       Depression Screen    08/01/2023    1:57 PM 04/21/2023   12:14 PM 04/12/2023   11:29 AM 03/22/2023    3:10 PM 02/08/2023    3:16 PM 09/20/2022   11:27 AM 07/30/2022    4:13 PM  PHQ 2/9 Scores  PHQ - 2 Score 1 2 2 2 3 2  0  PHQ- 9 Score 5 11 9 8 9 5      Fall Risk    08/01/2023    1:53 PM 03/31/2023   12:08 PM 03/22/2023    3:10 PM 02/08/2023    3:16 PM 09/20/2022   11:26 AM  Fall Risk   Falls in the past year? 0 0 0 0 0  Number falls in past yr: 0 0 0 0 0  Injury with Fall? 0 0 0 0 0  Risk for fall due to : No Fall Risks No Fall Risks No Fall Risks No Fall Risks No Fall Risks  Follow up Falls prevention discussed;Falls evaluation completed Falls evaluation completed Falls evaluation completed Falls evaluation completed Falls evaluation completed    MEDICARE RISK AT HOME: Medicare Risk at Home Any stairs in or around the home?: No If so, are there any without handrails?: No Home free of loose throw rugs in walkways, pet beds, electrical cords, etc?: Yes Adequate lighting in your home to reduce risk of falls?: Yes Life alert?: No Use of a cane, walker or w/c?: No Grab bars in the bathroom?: Yes Shower chair or bench in shower?: Yes Elevated toilet seat or a handicapped toilet?: No   Cognitive Function:    07/09/2019   12:20 PM 07/06/2018   12:40 PM  MMSE - Mini Mental State Exam  Orientation to time 5 5  Orientation to Place 5 5  Registration 3 3  Attention/ Calculation 3 0  Recall 2 3  Language- name 2 objects  0  Language- repeat 1 1  Language- follow 3 step command  3  Language-  read & follow direction  0  Write a sentence  0  Copy design  0  Total score  20      10/05/2022    4:06 PM  Montreal Cognitive Assessment   Visuospatial/ Executive (0/5) 3  Naming (0/3) 3  Attention: Read list of digits (0/2) 1  Attention: Read list of letters (0/1) 1  Attention: Serial 7 subtraction starting at 100 (0/3) 3  Language: Repeat phrase (0/2) 2  Language : Fluency (0/1) 1  Abstraction (0/2) 2  Delayed Recall (0/5) 4  Orientation (0/6) 5  Total 25  Adjusted Score (based on education) 25      08/01/2023    4:25 PM 07/30/2022    4:22 PM  6CIT Screen  What Year? 0 points 0 points  What month? 0 points  0 points  What time? 0 points 0 points  Count back from 20 0 points 0 points  Months in reverse 0 points   Repeat phrase 0 points   Total Score 0 points     Immunizations Immunization History  Administered Date(s) Administered   Fluad Quad(high Dose 65+) 05/16/2019, 04/15/2020   Fluad Trivalent(High Dose 65+) 06/14/2023   Influenza, High Dose Seasonal PF 04/21/2021   Influenza,inj,Quad PF,6+ Mos 05/09/2015, 05/07/2016, 03/30/2017, 05/10/2018   Influenza-Unspecified 05/01/2014   Moderna Covid-19 Vaccine  Bivalent Booster 60yrs & up 04/30/2021   Moderna Sars-Covid-2 Vaccination 08/29/2019, 09/26/2019   Pneumococcal Polysaccharide-23 12/10/2014, 07/06/2018, 04/30/2021   Td 12/10/2014   Tdap 02/08/2020   Zoster Recombinant(Shingrix ) 02/27/2019, 06/12/2019   Zoster, Live 04/12/2009    TDAP status: Up to date  Flu Vaccine status: Up to date  Pneumococcal vaccine status: Up to date  Covid-19 vaccine status: Information provided on how to obtain vaccines.   Qualifies for Shingles Vaccine? Yes   Zostavax completed Yes   Shingrix  Completed?: No.    Education has been provided regarding the importance of this vaccine. Patient has been advised to call insurance company to determine out of pocket expense if they have not yet received this vaccine. Advised may also  receive vaccine at local pharmacy or Health Dept. Verbalized acceptance and understanding.  Screening Tests Health Maintenance  Topic Date Due   MAMMOGRAM  06/01/2020   Colonoscopy  04/22/2022   Pneumonia Vaccine 31+ Years old (2 of 2 - PCV) 04/30/2022   Medicare Annual Wellness (AWV)  07/31/2023   DTaP/Tdap/Td (3 - Td or Tdap) 02/07/2030   INFLUENZA VACCINE  Completed   DEXA SCAN  Completed   Hepatitis C Screening  Completed   Zoster Vaccines- Shingrix   Completed   HPV VACCINES  Aged Out   COVID-19 Vaccine  Discontinued    Health Maintenance  Health Maintenance Due  Topic Date Due   MAMMOGRAM  06/01/2020   Colonoscopy  04/22/2022   Pneumonia Vaccine 50+ Years old (2 of 2 - PCV) 04/30/2022   Medicare Annual Wellness (AWV)  07/31/2023    Colorectal cancer screening: Type of screening: Colonoscopy. Completed 04/2017. Repeat every 5 years Patient will schedule this.  Mammogram status: Completed 05/2018. Repeat every year Order was placed 09/2022 Patient aware  Bone Density status: Completed 11/2021. Results reflect: Bone density results: OSTEOPENIA. Repeat every 2 years.  Lung Cancer Screening: (Low Dose CT Chest recommended if Age 66-80 years, 20 pack-year currently smoking OR have quit w/in 15years.) does not qualify.     Additional Screening:  Hepatitis C Screening: does qualify; Completed 06/2018  Vision Screening: Recommended annual ophthalmology exams for early detection of glaucoma and other disorders of the eye. Is the patient up to date with their annual eye exam?  Yes  Who is the provider or what is the name of the office in which the patient attends annual eye exams? [facility] If pt is not established with a provider, would they like to be referred to a provider to establish care? No .   Dental Screening: Recommended annual dental exams for proper oral hygiene  Community Resource Referral / Chronic Care Management: CRR required this visit?  No   CCM  required this visit?  No     Plan:     I have personally reviewed and noted the following in the patient's chart:   Medical and social history Use of alcohol, tobacco or illicit drugs  Current medications and supplements  including opioid prescriptions. Patient is not currently taking opioid prescriptions. Functional ability and status Nutritional status Physical activity Advanced directives List of other physicians Hospitalizations, surgeries, and ER visits in previous 12 months Vitals Screenings to include cognitive, depression, and falls Referrals and appointments  In addition, I have reviewed and discussed with patient certain preventive protocols, quality metrics, and best practice recommendations. A written personalized care plan for preventive services as well as general preventive health recommendations were provided to patient.     Angeline Fredericks, LPN   [id number]   After Visit Summary: (MyChart) Due to this being a telephonic visit, the after visit summary with patients personalized plan was offered to patient via MyChart   Nurse Notes: See routing comments

## 2023-08-02 ENCOUNTER — Ambulatory Visit: Payer: Medicare Other

## 2023-08-02 ENCOUNTER — Telehealth: Payer: Self-pay | Admitting: Psychiatry

## 2023-08-02 DIAGNOSIS — S42292A Other displaced fracture of upper end of left humerus, initial encounter for closed fracture: Secondary | ICD-10-CM | POA: Diagnosis not present

## 2023-08-02 NOTE — Telephone Encounter (Signed)
 Patient scheduled for appointment 08-09-23 due to missed appointment on 08-01-23 due to sickness. Attendance policy reviewed since at least 5 appointments have been cancelled/no show. Patient stated I spoke to her like a child. Apologized if it seemed that way but the policy has to be reviewed and appointment kept for refills. Stated she understands the policy and is asking if you will still refill her medication. Please advise

## 2023-08-02 NOTE — Telephone Encounter (Signed)
 Patient is scheduled for 08-09-23, reviewed attendance policy due to at least 5 cancelled/no show appointments. Stated I talked to her like a child and understands the policy. Apologized if it seemed that way, but wanted her to be aware she needs to keep the appointment. She asked if you would refill her medication. Please advise.

## 2023-08-02 NOTE — Telephone Encounter (Signed)
Refill is ordered.

## 2023-08-02 NOTE — Telephone Encounter (Signed)
 pt left a message that she was sick and that she needed a refill on the rexulti. pt last seen on 10-15

## 2023-08-03 DIAGNOSIS — Z23 Encounter for immunization: Secondary | ICD-10-CM | POA: Diagnosis not present

## 2023-08-03 NOTE — Telephone Encounter (Signed)
 left message rx sent and to also make sure she keeps appt.

## 2023-08-04 DIAGNOSIS — L02821 Furuncle of head [any part, except face]: Secondary | ICD-10-CM | POA: Diagnosis not present

## 2023-08-04 DIAGNOSIS — L718 Other rosacea: Secondary | ICD-10-CM | POA: Diagnosis not present

## 2023-08-04 DIAGNOSIS — L218 Other seborrheic dermatitis: Secondary | ICD-10-CM | POA: Diagnosis not present

## 2023-08-06 NOTE — Progress Notes (Unsigned)
BH MD/PA/NP OP Progress Note  08/09/2023 1:45 PM Katherine [name]  MRN:  [id number]  Chief Complaint:  Chief Complaint  Patient presents with   Follow-up   HPI:  - she is not seen since Oct 2024.  This is a follow-up appointment for neurocognitive disorder, anxiety.  She states that rexulti has helped so much.  She has been able to do things.  She feels like a new person.   Although she occasionally thinks about issues around her cell phone when she was asked, it is not nearly as much.  It is not controlling her anymore.  She believes combination of duloxetine and rexulti is very good.  She does not feel paralyzed with depression and anxiety.  She wants to do things such as cooking, and going out. She wonders if she can try higher dose as she still has insomnia, although it has been improving.  She sleeps at midnight after watching TV, and sleeps for 5 hours.  Of note, she asks the documentation about delusion to be deleted, as she does not think she has delusion.   Katherine [name], her husband presents to the visit.  He thinks medication has been helping.  She has been getting better.  She is able to wake up in the morning, and ready to eat something or going to the grocery store.  Both of them agreed to change night time routine of watching TV to improve sleep hygiene. He denies other concerns.   190 lbs (March 2024) Wt Readings from Last 3 Encounters:  08/09/23 146 lb 6.4 oz (66.4 kg)  08/01/23 144 lb (65.3 kg)  06/14/23 146 lb (66.2 kg)    Visit Diagnosis:    ICD-10-CM   1. Neurocognitive disorder  R41.9 Folate    2. Anxiety disorder, unspecified type  F41.9 TSH      Past Psychiatric History: Please see initial evaluation for full details. I have reviewed the history. No updates at this time.     Past Medical History:  Past Medical History:  Diagnosis Date   Anemia 2018   Anxiety    Arthritis    Borderline diabetes    Cataract    Chest pain    a. 2007 Cath: nl cors.  Anomalous  RCA arising from LAD diagonal (versus total native RCA with collateral); b. 03/2022 MV: EF >65%, no ischemia/infarct. No signif cor/Ao Ca2+. Low risk.   Chicken pox    age 21   Chronic back pain    Compressed discs. [facility]   Depression    Diastolic dysfunction    a. 06/2020 Echo: EF 60-65%, no rwma, GrI DD, nl RV size/fxn. RVSP 30.5 mmHg.   GERD (gastroesophageal reflux disease)    High cholesterol    Hypertension    Hypokalemia    Migraine headache    Obesity    Parkinson's disease (HCC)    Pre-diabetes    okay now    Sinus tachycardia    a. 02/2022 Zio: predominantly sinus rhythm, avg of 99 bpm (77-134). Rare PACs/PVCs. Triggered events = sinus rhythm.    Past Surgical History:  Procedure Laterality Date   BLADDER SUSPENSION  2005   CARDIAC CATHETERIZATION     CESAREAN SECTION     CLOSED REDUCTION NASAL FRACTURE N/A 01/11/2020   Procedure: CLOSED REDUCTION NASAL FRACTURE;  Surgeon: Linus Salmons, MD;  Location: [facility];  Service: ENT;  Laterality: N/A;   ENDOMETRIAL ABLATION  2007   FOOT SURGERY  bilateral bunions   LUMBAR LAMINECTOMY/DECOMPRESSION MICRODISCECTOMY Bilateral 01/12/2016   Procedure: Bilateral L4-5 Laminotomy/Foraminotomy;  Surgeon: Tressie Stalker, MD;  Location: [facility];  Service: Neurosurgery;  Laterality: Bilateral;  Bilateral L4-5 Laminotomy/Foraminotomy   TONSILLECTOMY  1971    Family Psychiatric History: Please see initial evaluation for full details. I have reviewed the history. No updates at this time.     Family History:  Family History  Problem Relation Age of Onset   Depression Mother    Heart disease Mother    Hypertension Mother    Heart failure Mother        CHF   Diabetes Mother    Cancer Mother        CERVICAL CANCER   Atrial fibrillation Mother    Bipolar disorder Father    Heart disease Father    Diabetes Father    Rashes / Skin problems Father    Hypertension Father    Cirrhosis  Father        liver disease non alcoholic   Bipolar disorder Sister    Diabetes Sister    Breast cancer Sister 47   Diabetes Mellitus II Sister    Heart disease Brother    Heart failure Brother        CHF   Diabetes Brother    Cancer Brother 45       THROAT AND NECK   Esophageal cancer Brother    Lung cancer Brother    Atrial fibrillation Brother    Cancer Maternal Grandfather    Colon cancer Neg Hx    Stomach cancer Neg Hx    Rectal cancer Neg Hx     Social History:  Social History   Socioeconomic History   Marital status: Married    Spouse name: Not on file   Number of children: 1   Years of education: Not on file   Highest education level: Not on file  Occupational History   Occupation: Magazine features editor: [employer]    Comment: retired-special needs teacher  Tobacco Use   Smoking status: Never   Smokeless tobacco: Never  Vaping Use   Vaping status: Never Used  Substance and Sexual Activity   Alcohol use: No   Drug use: No   Sexual activity: Yes    Partners: Male    Birth control/protection: Post-menopausal  Other Topics Concern   Not on file  Social History Narrative   Married.   One son is 1, lives in [place].   Retired from Agricultural consultant.   Enjoys substitute teaching, gardening, cooking.   Social Drivers of Corporate investment banker Strain: Low Risk  (08/01/2023)   Overall Financial Resource Strain (CARDIA)    Difficulty of Paying Living Expenses: Not hard at all  Food Insecurity: No Food Insecurity (08/01/2023)   Hunger Vital Sign    Worried About Running Out of Food in the Last Year: Never true    Ran Out of Food in the Last Year: Never true  Transportation Needs: No Transportation Needs (08/01/2023)   PRAPARE - Administrator, Civil Service (Medical): No    Lack of Transportation (Non-Medical): No  Physical Activity: Inactive (08/01/2023)   Exercise Vital Sign    Days of Exercise per Week: 0 days    Minutes of Exercise  per Session: 0 min  Stress: No Stress Concern Present (08/01/2023)   [employer] of Occupational Health - Occupational Stress Questionnaire    Feeling of Stress :  Only a little  Social Connections: Moderately Integrated (08/01/2023)   Social Connection and Isolation Panel [NHANES]    Frequency of Communication with Friends and Family: More than three times a week    Frequency of Social Gatherings with Friends and Family: Once a week    Attends Religious Services: More than 4 times per year    Active Member of [employer] or Organizations: No    Attends Banker Meetings: Never    Marital Status: Married    Allergies:  Allergies  Allergen Reactions   Chlorine Other (See Comments)    Smell causes lightheadedness.   Dilaudid [Hydromorphone]     hallucinations   Codeine Anxiety   Darvon Anxiety   Hydrocodone Anxiety   Meperidine Anxiety   Oxycodone Anxiety   Tramadol Anxiety   Victoza [Liraglutide] Nausea Only and Other (See Comments)    And weakness    Metabolic Disorder Labs: Lab Results  Component Value Date   HGBA1C 5.9 (H) 07/28/2022   MPG 120 01/09/2016   MPG 131 (H) 09/10/2014   No results found for: "PROLACTIN" Lab Results  Component Value Date   CHOL 211 (H) 03/10/2021   TRIG 266.0 (H) 03/10/2021   HDL 57.40 03/10/2021   CHOLHDL 4 03/10/2021   VLDL 53.2 (H) 03/10/2021   LDLCALC 60 04/15/2020   LDLCALC 118 (H) 07/09/2019   Lab Results  Component Value Date   TSH 1.180 12/02/2021   TSH 3.33 10/07/2020    Therapeutic Level Labs: No results found for: "LITHIUM" No results found for: "VALPROATE" No results found for: "CBMZ"  Current Medications: Current Outpatient Medications  Medication Sig Dispense Refill   BIOTIN PO Take by mouth daily.     DULoxetine (CYMBALTA) 30 MG capsule Take 30 mg by mouth daily.     DULoxetine (CYMBALTA) 60 MG capsule Take 60 mg by mouth every morning. Takes with her 30mg  capsule to equal 90 mg daily     famotidine  (PEPCID) 20 MG tablet TAKE 1 TABLET (20 MG TOTAL) BY MOUTH DAILY. FOR HEARTBURN. 90 tablet 3   Multiple Vitamin (MULTIVITAMIN) tablet Take 1 tablet by mouth daily.     NUPLAZID 34 MG CAPS Take 1 capsule by mouth daily.     pantoprazole (PROTONIX) 40 MG tablet TAKE 1 TABLET (40 MG TOTAL) BY MOUTH 2 (TWO) TIMES DAILY BEFORE A MEAL. FOR HEARTBURN. 180 tablet 1   propranolol (INDERAL) 20 MG tablet TAKE 1 TABLET (20 MG TOTAL) BY MOUTH 3 (THREE) TIMES DAILY AS NEEDED. FOR HEARTRATE >110 BPM 90 tablet 2   spironolactone (ALDACTONE) 25 MG tablet Take 1 tablet (25 mg total) by mouth daily. 90 tablet 3   SUMAtriptan (IMITREX) 100 MG tablet Take 1 tablet by mouth at migraine onset. Do not exceed 100 mg in 24 hours. 10 tablet 0   tamsulosin (FLOMAX) 0.4 MG CAPS capsule Take 0.4 mg by mouth.     topiramate (TOPAMAX) 50 MG tablet Take 1 tablet by mouth every morning and 2 tablets at night for migraine prevention. 270 tablet 1   Brexpiprazole (REXULTI) 0.5 MG TABS Take 1 tablet (0.5 mg total) by mouth daily. 90 tablet 0   No current facility-administered medications for this visit.     Musculoskeletal: Strength & Muscle Tone:  normal Gait & Station: normal Patient leans: N/A  Psychiatric Specialty Exam: Review of Systems  Psychiatric/Behavioral:  Positive for sleep disturbance. Negative for agitation, behavioral problems, confusion, decreased concentration, dysphoric mood, hallucinations, self-injury and suicidal ideas.  The patient is nervous/anxious. The patient is not hyperactive.   All other systems reviewed and are negative.   Blood pressure 130/76, pulse 97, temperature 97.7 F (36.5 C), temperature source Skin, height 5\' 2"  (1.575 m), weight 146 lb 6.4 oz (66.4 kg).Body mass index is 26.78 kg/m.  General Appearance: Well Groomed  Eye Contact:  Good  Speech:  Clear and Coherent  Volume:  Normal  Mood:   good  Affect:  Appropriate, Congruent, and Full Range  Thought Process:  Coherent   Orientation:  Full (Time, Place, and Person)  Thought Content: Logical   Suicidal Thoughts:  No  Homicidal Thoughts:  No  Memory:  Immediate;   Good  Judgement:  Good  Insight:  Good  Psychomotor Activity:  Normal, Normal tone, no rigidity, no resting/postural tremors, no tardive dyskinesia    Concentration:  Concentration: Good and Attention Span: Good  Recall:  Good  Fund of Knowledge: Good  Language: Good  Akathisia:  No  Handed:  Right  AIMS (if indicated): 0  Assets:  Communication Skills Desire for Improvement  ADL's:  Intact  Cognition: WNL  Sleep:  Fair   Screenings: GAD-7    Flowsheet Row Office Visit from 04/21/2023 in [facility] Office Visit from 03/22/2023 in [facility] [place] Office Visit from 02/08/2023 in [facility] [place] Office Visit from 09/20/2022 in [facility]  Total GAD-7 Score 13 9 6 8       Mini-Mental    Flowsheet Row Clinical Support from 07/06/2018 in [facility]  Total Score (max 30 points ) 20      PHQ2-9    Flowsheet Row Clinical Support from 08/01/2023 in [facility] [place] Office Visit from 04/21/2023 in [facility] Office Visit from 04/12/2023 in [facility] [place] Office Visit from 03/22/2023 in [facility] [place] Office Visit from 02/08/2023 in [facility] [place]  PHQ-2 Total Score 1 2 2 2 3   PHQ-9 Total Score 5 11 9 8 9       Flowsheet Row ED from 06/04/2023 in [facility] at [facility] ED from 12/02/2021 in [facility] Department at [facility] ED from 05/11/2021 in [facility]   C-SSRS RISK CATEGORY No Risk No Risk No Risk        Assessment and Plan:   ARYIAN VAILES is a 73 y.o. year old female with a history of depression, anxiety, mild cognitive impairment, who is referred for anxiety and depression.   1. Neurocognitive disorder Functional Status   IADL: Independent in the following: managing finances, medications, driving           Requires assistance with the following:  ADL  Independent in the following: bathing and hygiene, feeding, continence, grooming and toileting, walking          Requires assistance with the following: Folate, Vitamin B12, TSH Images  PET CT brain metabolic evaluation showed hypometabolism involving both cerebral hemispheres, basal ganglia and brainstem, MRI of the brain mild generalized parenchymal volume loss, without any particular lobar predominance, mild small vessel disease Neuropsych assessment:  Etiology:   The exam is notable for slight improvement in pressured speech, reduced rumination on her previously expressed concerns about her phone or someone tapping it,  although these thoughts remain ego-syntonic.  She asks the diagnosis of delusion to be related as she does not think this is delusion.  She reports significant improvement in anxiety, and has been able to engage in daily activities since starting rexulti.  Although she may benefit from higher dose to target behavior disturbances associated with cognitive impairment, we will stay on the current dose at this time to mitigate potential side effect, with the hope that she will derive more benefit as her activity level increases.  Noted that she is also on nuplazid with limited benefit.  Will continue the medication for now to avoid a relapse in her symptoms, although monotherapy is usually recommended.  Because potential risk of QTc prolongation, EPS, increased mortality with people with dementia.  She was advised again to obtain labs to rule out any medical issues contributing to her symptoms. Will continue to assess the possibility of medication-induced  mania (as she was recently on mirtazapine), and bipolar disorder (she has a family history of bipolar), although the onset is not typical.   2. Anxiety disorder, unspecified type - history of anxiety for many years Significantly improving since starting rexulti.  Will continue duloxetine for anxiety. Will make intervention as outlined above.    # weight loss According to the chart review, she was seen by a gastroenterologist in 2023. "Last colonoscopy October 2018 with finding of a 9 mm polyp in the ascending colon which was a tubular adenoma and noted multiple diverticuli." She had EGD with dilation in Sept 2023, although the detailed reports are not available.  Although mood symptoms can suddenly close weight loss, we will examine also out any medical health issues contributing to it.    Plan Continue duloxetine 90 mg daily  Continue Rexulti 0.5 mg daily - EKG QTc 384 msec, hr 76, 03/2023 Obtain lab (TSH, folate) Next appointment: 3/18 at 11:30, IP - on nuplazid 34 mg daily    Past trials: quetiapine- nightmares, olanzapine, vraylar, trazodone- headache   The patient demonstrates the following risk factors for suicide: Chronic risk factors for suicide include: psychiatric disorder of depression . Acute risk factors for suicide include: family or marital conflict and unemployment. Protective factors for this patient include: positive social support and hope for the future. Considering these factors, the overall suicide risk at this point appears to be low. Patient is appropriate for outpatient follow up.       Collaboration of Care: Collaboration of Care: Other reviewed notes in Epic  Patient/Guardian was advised Release of Information must be obtained prior to any record release in order to collaborate their care with an outside provider. Patient/Guardian was advised if they have not already done so to contact the registration department to sign all necessary forms in order for [place] to release  information regarding their care.   Consent: Patient/Guardian gives verbal consent for treatment and assignment of benefits for services provided during this visit. Patient/Guardian expressed understanding and agreed to proceed.    Neysa Hotter, MD 08/09/2023, 1:45 PM

## 2023-08-09 ENCOUNTER — Encounter: Payer: Self-pay | Admitting: Psychiatry

## 2023-08-09 ENCOUNTER — Ambulatory Visit (INDEPENDENT_AMBULATORY_CARE_PROVIDER_SITE_OTHER): Payer: Medicare Other

## 2023-08-09 ENCOUNTER — Ambulatory Visit (INDEPENDENT_AMBULATORY_CARE_PROVIDER_SITE_OTHER): Payer: Medicare Other | Admitting: Psychiatry

## 2023-08-09 ENCOUNTER — Other Ambulatory Visit: Payer: Self-pay | Admitting: Primary Care

## 2023-08-09 VITALS — BP 130/76 | HR 97 | Temp 97.7°F | Ht 62.0 in | Wt 146.4 lb

## 2023-08-09 DIAGNOSIS — R419 Unspecified symptoms and signs involving cognitive functions and awareness: Secondary | ICD-10-CM | POA: Diagnosis not present

## 2023-08-09 DIAGNOSIS — F419 Anxiety disorder, unspecified: Secondary | ICD-10-CM

## 2023-08-09 DIAGNOSIS — E538 Deficiency of other specified B group vitamins: Secondary | ICD-10-CM | POA: Diagnosis not present

## 2023-08-09 MED ORDER — CYANOCOBALAMIN 1000 MCG/ML IJ SOLN
1000.0000 ug | Freq: Once | INTRAMUSCULAR | Status: AC
  Administered 2023-08-09: 1000 ug via INTRAMUSCULAR

## 2023-08-09 MED ORDER — REXULTI 0.5 MG PO TABS: 0.5000 mg | ORAL_TABLET | Freq: Every day | ORAL | 0 refills | Status: AC

## 2023-08-09 NOTE — Progress Notes (Signed)
Per orders of Mayra Reel, NP, injection of #1 of 2 bi-weekly B12 1000 mcg/ml given by Eual Fines, CMA in Left Deltoid. Patient tolerated injection well.

## 2023-08-09 NOTE — Patient Instructions (Addendum)
Continue duloxetine 90 mg daily  Continue Rexulti 0.5 mg daily  Obtain lab (TSH, folate) Next appointment: 3/18 at 11:30

## 2023-08-10 ENCOUNTER — Other Ambulatory Visit: Payer: Self-pay | Admitting: Primary Care

## 2023-08-12 ENCOUNTER — Telehealth: Payer: Self-pay | Admitting: Primary Care

## 2023-08-12 NOTE — Telephone Encounter (Signed)
Received the message below from patient's husband's MyChart account:  "Jae Dire, I have a huge concern with what is given Erroneously for my wife on your Clinician records. We recently checked into why her Medicare Supplement [employer]) is going to increase from $170.00 per month to almost $300.00 per month, beginning next month, and found out that the reason for this is that the Diagnosis of "Parkinson's" and "Dementia" are both on her records with [employer] [employer] at [facility]. Jae Dire, both of these diagnoses are incorrect.  Bev does not have, and has not ever had, either of these disorders.  Neither does she have "Parkinsonism" which might have been confused or misspelled in your Notes.  This error, of course, will affect our financial situation. It has also affected Bev's emotional health.   Would you please correct these errors in your records? If you need clarification, please see Dr. Zannie Cove office notes.   Sincerely,  Loraine Leriche"   The diagnosis of Lewy body dementia originated from [facility] clinic neurology, Dr. Sherryll Burger, from office notes dated 01/15/2020.  This diagnosis is repeated during subsequent office visit notes in 2021 and 2022. The mention of parkinsonism originated in procedural notes from Dr. Sherryll Burger on 09/09/19.   The diagnoses "mild cognitive impairment versus mild dementia, probable Lewy body disease" and "parkinsonism" were mentioned in office notes dated 08/12/2021 by Dr. Kern Alberta from the [facility].  Other diagnoses include "visual hallucinations, confusional arousals".  Furthermore, the suspicion for Lewy body dementia versus frontotemporal dementia-parkinsonism were discussed and office notes dated 09/24/2021 per Dr. Kern Alberta from the [facility]. The diagnosis of Lewy Body Dementia was discussed again on 05/04/22 per Dr. Nedra Hai from [facility].  Evaluated by Dr. Terrace Arabia, [facility] for second opinion of Lewy Body Dementia and Frontotemporal Dementia. Dr. Terrace Arabia  introduced the diagnosis of Mild Cognitive Impairment on 10/05/22, she did not see symptoms of Parkinson's disease. Dr. Zannie Cove recommendation was for neuropsychological evaluation for which referral was placed. Evaluated again by Dr. Terrace Arabia on 05/04/2023, diagnosis of mild cognitive impairment was used. She has yet to complete neuropsychological testing.  Evaluated by Dr. Vanetta Shawl, psychiatry, on 04/21/2023 to establish care.  Diagnoses used during this visit are delusion, neurocognitive disorder, anxiety disorder, unspecified type.  Evaluated again on 08/09/23 by Dr. Vanetta Shawl, diagnoses used included neurocognitive disorder, anxiety disorder unspecified type.  As there seems to be inconsistency regarding a formal diagnosis for this patient, we will change diagnoses of frontotemporal dementia and Lewy body dementia to mild cognitive impairment and neurocognitive disorder.  These diagnoses are used repeatedly by neurology and psychiatry.   Patient and her husband will be notified of changes.

## 2023-08-16 ENCOUNTER — Encounter: Payer: Self-pay | Admitting: Internal Medicine

## 2023-08-17 ENCOUNTER — Other Ambulatory Visit: Payer: Self-pay | Admitting: *Deleted

## 2023-08-17 ENCOUNTER — Ambulatory Visit: Payer: Medicare Other | Admitting: Urology

## 2023-08-17 DIAGNOSIS — R300 Dysuria: Secondary | ICD-10-CM

## 2023-08-23 ENCOUNTER — Ambulatory Visit (INDEPENDENT_AMBULATORY_CARE_PROVIDER_SITE_OTHER): Payer: Medicare Other

## 2023-08-23 DIAGNOSIS — E538 Deficiency of other specified B group vitamins: Secondary | ICD-10-CM

## 2023-08-23 MED ORDER — CYANOCOBALAMIN 1000 MCG/ML IJ SOLN
1000.0000 ug | Freq: Once | INTRAMUSCULAR | Status: AC
  Administered 2023-08-23: 1000 ug via INTRAMUSCULAR

## 2023-08-23 NOTE — Patient Instructions (Signed)
Schedule next B12 injection for 31 days from today.

## 2023-08-23 NOTE — Progress Notes (Signed)
Per orders of Mayra Reel, NP, injection of #2 of 2 bi-weekly B12 1000 mcg/ml given by Eual Fines, CMA in Right Deltoid. Patient tolerated injection well.  Pt made aware the next B12 injection will be in 1 month (31 days)

## 2023-08-24 DIAGNOSIS — R419 Unspecified symptoms and signs involving cognitive functions and awareness: Secondary | ICD-10-CM

## 2023-08-25 ENCOUNTER — Ambulatory Visit: Payer: Self-pay | Admitting: Urology

## 2023-08-29 ENCOUNTER — Other Ambulatory Visit (INDEPENDENT_AMBULATORY_CARE_PROVIDER_SITE_OTHER): Payer: Medicare Other

## 2023-08-29 DIAGNOSIS — R419 Unspecified symptoms and signs involving cognitive functions and awareness: Secondary | ICD-10-CM

## 2023-08-29 DIAGNOSIS — L649 Androgenic alopecia, unspecified: Secondary | ICD-10-CM | POA: Diagnosis not present

## 2023-08-29 DIAGNOSIS — L659 Nonscarring hair loss, unspecified: Secondary | ICD-10-CM | POA: Diagnosis not present

## 2023-08-30 LAB — TSH: TSH: 1.16 u[IU]/mL (ref 0.35–5.50)

## 2023-08-30 LAB — FOLATE: Folate: 20.5 ng/mL (ref >5.9)

## 2023-09-01 DIAGNOSIS — M25512 Pain in left shoulder: Secondary | ICD-10-CM | POA: Diagnosis not present

## 2023-09-02 ENCOUNTER — Ambulatory Visit
Admission: RE | Admit: 2023-09-02 | Discharge: 2023-09-02 | Disposition: A | Discharge status: Home / Self Care | Payer: Medicare Other | Source: Ambulatory Visit | Attending: Primary Care | Admitting: Primary Care

## 2023-09-02 DIAGNOSIS — Z1231 Encounter for screening mammogram for malignant neoplasm of breast: Secondary | ICD-10-CM

## 2023-09-03 ENCOUNTER — Other Ambulatory Visit: Payer: Self-pay | Admitting: Primary Care

## 2023-09-03 DIAGNOSIS — E559 Vitamin D deficiency, unspecified: Secondary | ICD-10-CM

## 2023-09-05 DIAGNOSIS — F3130 Bipolar disorder, current episode depressed, mild or moderate severity, unspecified: Secondary | ICD-10-CM | POA: Diagnosis not present

## 2023-09-05 DIAGNOSIS — M25512 Pain in left shoulder: Secondary | ICD-10-CM | POA: Diagnosis not present

## 2023-09-06 DIAGNOSIS — E559 Vitamin D deficiency, unspecified: Secondary | ICD-10-CM

## 2023-09-06 DIAGNOSIS — S42292A Other displaced fracture of upper end of left humerus, initial encounter for closed fracture: Secondary | ICD-10-CM | POA: Diagnosis not present

## 2023-09-06 DIAGNOSIS — R7303 Prediabetes: Secondary | ICD-10-CM

## 2023-09-06 DIAGNOSIS — E782 Mixed hyperlipidemia: Secondary | ICD-10-CM

## 2023-09-07 ENCOUNTER — Encounter: Payer: Self-pay | Admitting: Internal Medicine

## 2023-09-08 DIAGNOSIS — M25512 Pain in left shoulder: Secondary | ICD-10-CM | POA: Diagnosis not present

## 2023-09-09 ENCOUNTER — Other Ambulatory Visit (INDEPENDENT_AMBULATORY_CARE_PROVIDER_SITE_OTHER): Payer: Medicare Other

## 2023-09-09 DIAGNOSIS — E782 Mixed hyperlipidemia: Secondary | ICD-10-CM

## 2023-09-09 DIAGNOSIS — R7303 Prediabetes: Secondary | ICD-10-CM

## 2023-09-09 DIAGNOSIS — E559 Vitamin D deficiency, unspecified: Secondary | ICD-10-CM

## 2023-09-09 LAB — LIPID PANEL
Cholesterol: 231 mg/dL — ABNORMAL HIGH (ref 0–200)
HDL: 59.4 mg/dL (ref >39.00)
LDL Cholesterol: 141 mg/dL — ABNORMAL HIGH (ref 0–99)
NonHDL: 171.84
Total CHOL/HDL Ratio: 4
Triglycerides: 152 mg/dL — ABNORMAL HIGH (ref 0.0–149.0)
VLDL: 30.4 mg/dL (ref 0.0–40.0)

## 2023-09-09 LAB — VITAMIN D 25 HYDROXY (VIT D DEFICIENCY, FRACTURES): VITD: 27.77 ng/mL — ABNORMAL LOW (ref 30.00–100.00)

## 2023-09-09 LAB — HEMOGLOBIN A1C: Hgb A1c MFr Bld: 5.7 % (ref 4.6–6.5)

## 2023-09-12 ENCOUNTER — Telehealth: Payer: Self-pay

## 2023-09-12 NOTE — Telephone Encounter (Signed)
 left message with the information.

## 2023-09-12 NOTE — Telephone Encounter (Signed)
 pt left message that she needs a list of medications that she has tried and failed in order to complete a form to have rexulti tier redueced.

## 2023-09-13 MED ORDER — VITAMIN D3 50 MCG (2000 UT) PO CAPS: 2000.0000 [IU] | ORAL_CAPSULE | Freq: Every day | ORAL | 3 refills | Status: DC

## 2023-09-13 MED ORDER — ROSUVASTATIN CALCIUM 5 MG PO TABS: 5.0000 mg | ORAL_TABLET | Freq: Every day | ORAL | 3 refills | Status: AC

## 2023-09-15 DIAGNOSIS — M25512 Pain in left shoulder: Secondary | ICD-10-CM | POA: Diagnosis not present

## 2023-09-20 DIAGNOSIS — M25512 Pain in left shoulder: Secondary | ICD-10-CM | POA: Diagnosis not present

## 2023-09-22 ENCOUNTER — Ambulatory Visit: Payer: Medicare Other

## 2023-09-22 DIAGNOSIS — E538 Deficiency of other specified B group vitamins: Secondary | ICD-10-CM

## 2023-09-22 MED ORDER — CYANOCOBALAMIN 1000 MCG/ML IJ SOLN
1000.0000 ug | Freq: Once | INTRAMUSCULAR | Status: AC
  Administered 2023-09-22: 1000 ug via INTRAMUSCULAR

## 2023-09-22 NOTE — Progress Notes (Signed)
 Per orders of Mayra Reel, DPN AGNP-C, who is out of office and Mordecai Maes who is in office injection of vitamin b 12  given by Lewanda Rife in right deltoid at pts request. Patient tolerated injection well. Patient will make appointment for 1 month.

## 2023-09-23 ENCOUNTER — Ambulatory Visit: Payer: Self-pay | Admitting: Urology

## 2023-09-27 DIAGNOSIS — M25512 Pain in left shoulder: Secondary | ICD-10-CM | POA: Diagnosis not present

## 2023-10-01 ENCOUNTER — Other Ambulatory Visit: Payer: Self-pay | Admitting: Primary Care

## 2023-10-01 NOTE — Telephone Encounter (Signed)
Patient is due for general follow up. Please schedule, thank you!

## 2023-10-03 NOTE — Telephone Encounter (Signed)
 Called patient, someone picked up but no response.

## 2023-10-04 ENCOUNTER — Ambulatory Visit: Payer: Self-pay | Admitting: Psychiatry

## 2023-10-04 DIAGNOSIS — S42292A Other displaced fracture of upper end of left humerus, initial encounter for closed fracture: Secondary | ICD-10-CM | POA: Diagnosis not present

## 2023-10-04 DIAGNOSIS — M25512 Pain in left shoulder: Secondary | ICD-10-CM | POA: Diagnosis not present

## 2023-10-04 NOTE — Telephone Encounter (Signed)
 Called  pt and schedule a appt for f/u

## 2023-10-06 ENCOUNTER — Ambulatory Visit: Admitting: Urology

## 2023-10-25 ENCOUNTER — Ambulatory Visit

## 2023-10-27 ENCOUNTER — Ambulatory Visit: Admitting: Primary Care

## 2023-10-27 ENCOUNTER — Other Ambulatory Visit: Payer: Self-pay | Admitting: Primary Care

## 2023-10-27 DIAGNOSIS — J302 Other seasonal allergic rhinitis: Secondary | ICD-10-CM

## 2023-10-31 ENCOUNTER — Ambulatory Visit: Payer: Medicare Other | Admitting: Internal Medicine

## 2023-11-02 ENCOUNTER — Ambulatory Visit: Admitting: Urology

## 2023-11-03 DIAGNOSIS — M25512 Pain in left shoulder: Secondary | ICD-10-CM | POA: Diagnosis not present

## 2023-11-11 DIAGNOSIS — F332 Major depressive disorder, recurrent severe without psychotic features: Secondary | ICD-10-CM | POA: Diagnosis not present

## 2023-11-17 ENCOUNTER — Encounter: Payer: Self-pay | Admitting: Internal Medicine

## 2023-11-18 ENCOUNTER — Telehealth: Payer: Self-pay | Admitting: Internal Medicine

## 2023-11-18 NOTE — Telephone Encounter (Signed)
 Patient called and stated that she is having fecal incontinence. Patient is requesting a call back. Please advise.

## 2023-11-18 NOTE — Telephone Encounter (Signed)
 Pt states she has been having issues with fecal incontinence for the past several days. Pt requesting to be seen. Scheduled pt to see Santina Cull PA 11/29/23@1 :30pm. Encouraged pt to try metamucil twice a day to help bulk up the stool. She denies any stool softeners or laxatives. Pt aware of appt.

## 2023-11-21 DIAGNOSIS — M25512 Pain in left shoulder: Secondary | ICD-10-CM | POA: Diagnosis not present

## 2023-11-29 ENCOUNTER — Ambulatory Visit: Admitting: Physician Assistant

## 2023-11-29 DIAGNOSIS — M25512 Pain in left shoulder: Secondary | ICD-10-CM | POA: Diagnosis not present

## 2023-11-30 ENCOUNTER — Ambulatory Visit: Admitting: Primary Care

## 2023-12-01 DIAGNOSIS — M25512 Pain in left shoulder: Secondary | ICD-10-CM | POA: Diagnosis not present

## 2023-12-05 DIAGNOSIS — M24612 Ankylosis, left shoulder: Secondary | ICD-10-CM | POA: Diagnosis not present

## 2023-12-07 DIAGNOSIS — M25512 Pain in left shoulder: Secondary | ICD-10-CM | POA: Diagnosis not present

## 2023-12-08 ENCOUNTER — Ambulatory Visit: Admitting: Primary Care

## 2023-12-09 DIAGNOSIS — M25512 Pain in left shoulder: Secondary | ICD-10-CM | POA: Diagnosis not present

## 2023-12-09 DIAGNOSIS — F3132 Bipolar disorder, current episode depressed, moderate: Secondary | ICD-10-CM | POA: Diagnosis not present

## 2023-12-20 DIAGNOSIS — M25512 Pain in left shoulder: Secondary | ICD-10-CM | POA: Diagnosis not present

## 2023-12-21 ENCOUNTER — Encounter: Payer: Self-pay | Admitting: Urology

## 2023-12-21 ENCOUNTER — Ambulatory Visit (INDEPENDENT_AMBULATORY_CARE_PROVIDER_SITE_OTHER): Admitting: Urology

## 2023-12-21 VITALS — BP 135/77 | HR 112 | Ht 62.0 in | Wt 158.6 lb

## 2023-12-21 DIAGNOSIS — R350 Frequency of micturition: Secondary | ICD-10-CM

## 2023-12-21 DIAGNOSIS — R399 Unspecified symptoms and signs involving the genitourinary system: Secondary | ICD-10-CM | POA: Diagnosis not present

## 2023-12-21 DIAGNOSIS — N3020 Other chronic cystitis without hematuria: Secondary | ICD-10-CM | POA: Diagnosis not present

## 2023-12-21 LAB — BLADDER SCAN AMB NON-IMAGING: Scan Result: 1

## 2023-12-21 LAB — URINALYSIS, COMPLETE
Bilirubin, UA: NEGATIVE
Glucose, UA: NEGATIVE
Ketones, UA: NEGATIVE
Leukocytes,UA: NEGATIVE
Nitrite, UA: NEGATIVE
Protein,UA: NEGATIVE
RBC, UA: NEGATIVE
Specific Gravity, UA: 1.015 (ref 1.005–1.030)
Urobilinogen, Ur: 0.2 mg/dL (ref 0.2–1.0)
pH, UA: 6 (ref 5.0–7.5)

## 2023-12-21 LAB — MICROSCOPIC EXAMINATION

## 2023-12-21 NOTE — Progress Notes (Signed)
 I, Maysun Jamey Mccallum, acting as a scribe for Geraline Knapp, MD., have documented all relevant documentation on the behalf of Geraline Knapp, MD, as directed by Geraline Knapp, MD while in the presence of Geraline Knapp, MD.  12/21/2023 6:39 PM   Katherine [name] 11-04-1950 [id number]  Referring provider: Gabriel John, NP [address] [place],  [place] [address]  Chief Complaint  Patient presents with   Urinary Frequency    HPI: Katherine [name] is a 73 y.o. female presents to establish local urologic care.   Has been followed by [facility] for chronic cystitis and lower urinary tract symptoms. Last seen there in October 2024. She lives in [place] and due to convenience, elected to be seen here.  History of recurrent UTIs previously on estrace. She has discontinued this medication and has not had any recent infections.  She does have urinary urgency and hesitancy and has been on tamsulosin, which helps these symptoms. She does have a history of Parkinson's since 2021. She had a normal cystoscopy in 2022 and history of elevated PVR.  Urodynamic Study 12/2021, with PVR 0 mL and bladder instability without leakage. Denies dysuria, gross hematuria No flank, abdominal, or pelvic pain.   PMH: Past Medical History:  Diagnosis Date   Anemia 2018   Anxiety    Arthritis    Borderline diabetes    Cataract    Chest pain    a. 2007 Cath: nl cors.  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 03/2022 MV: EF >65%, no ischemia/infarct. No signif cor/Ao Ca2+. Low risk.   Chicken pox    age 6   Chronic back pain    Compressed discs. [facility]   Depression    Diastolic dysfunction    a. 06/2020 Echo: EF 60-65%, no rwma, GrI DD, nl RV size/fxn. RVSP 30.5 mmHg.   GERD (gastroesophageal reflux disease)    High cholesterol    Hypertension    Hypokalemia    Migraine headache    Obesity    Parkinson's  disease (HCC)    Pre-diabetes    okay now    Sinus tachycardia    a. 02/2022 Zio: predominantly sinus rhythm, avg of 99 bpm (77-134). Rare PACs/PVCs. Triggered events = sinus rhythm.    Surgical History: Past Surgical History:  Procedure Laterality Date   BLADDER SUSPENSION  2005   CARDIAC CATHETERIZATION     CESAREAN SECTION     CLOSED REDUCTION NASAL FRACTURE N/A 01/11/2020   Procedure: CLOSED REDUCTION NASAL FRACTURE;  Surgeon: Lesly Raspberry, MD;  Location: [facility];  Service: ENT;  Laterality: N/A;   ENDOMETRIAL ABLATION  2007   FOOT SURGERY     bilateral bunions   LUMBAR LAMINECTOMY/DECOMPRESSION MICRODISCECTOMY Bilateral 01/12/2016   Procedure: Bilateral L4-5 Laminotomy/Foraminotomy;  Surgeon: Garry Kansas, MD;  Location: [facility];  Service: Neurosurgery;  Laterality: Bilateral;  Bilateral L4-5 Laminotomy/Foraminotomy   TONSILLECTOMY  1971    Home Medications:  Allergies as of 12/21/2023       Reactions   Chlorine Other (See Comments)   Smell causes lightheadedness.   Dilaudid  [hydromorphone ]    hallucinations   Codeine Anxiety   Darvon Anxiety   Hydrocodone Anxiety   Meperidine Anxiety   Oxycodone Anxiety   Tramadol  Anxiety   Victoza  [liraglutide ] Nausea Only, Other (See Comments)   And weakness        Medication List  Accurate as of December 21, 2023  6:39 PM. If you have any questions, ask your nurse or doctor.          BIOTIN PO Take by mouth daily.   doxycycline 20 MG tablet Commonly known as: PERIOSTAT   DULoxetine  60 MG capsule Commonly known as: CYMBALTA  Take 60 mg by mouth every morning. Takes with her 30mg  capsule to equal 90 mg daily   DULoxetine  30 MG capsule Commonly known as: CYMBALTA  Take 30 mg by mouth daily.   famotidine  20 MG tablet Commonly known as: PEPCID  TAKE 1 TABLET (20 MG TOTAL) BY MOUTH DAILY. FOR HEARTBURN.   levocetirizine 5 MG tablet Commonly known as: XYZAL  TAKE 1 TABLET BY MOUTH AT BEDTIME  AS NEEDED FOR ALLERGIES.   multivitamin tablet Take 1 tablet by mouth daily.   Nuplazid  34 MG Caps Generic drug: Pimavanserin  Tartrate Take 1 capsule by mouth daily.   pantoprazole  40 MG tablet Commonly known as: PROTONIX  TAKE 1 TABLET (40 MG TOTAL) BY MOUTH 2 (TWO) TIMES DAILY BEFORE A MEAL. FOR HEARTBURN.   propranolol  20 MG tablet Commonly known as: INDERAL  TAKE 1 TABLET (20 MG TOTAL) BY MOUTH 3 (THREE) TIMES DAILY AS NEEDED. FOR HEARTRATE >110 BPM   rosuvastatin  5 MG tablet Commonly known as: Crestor  Take 1 tablet (5 mg total) by mouth daily. for cholesterol.   spironolactone  25 MG tablet Commonly known as: ALDACTONE  Take 1 tablet (25 mg total) by mouth daily.   SUMAtriptan  100 MG tablet Commonly known as: IMITREX  Take 1 tablet by mouth at migraine onset. Do not exceed 100 mg in 24 hours. What changed:  when to take this reasons to take this   tamsulosin 0.4 MG Caps capsule Commonly known as: FLOMAX Take 0.4 mg by mouth.   topiramate  50 MG tablet Commonly known as: Topamax  Take 1 tablet by mouth every morning and 2 tablets at night for migraine prevention.   Vitamin D3 50 MCG (2000 UT) capsule Take 1 capsule (2,000 Units total) by mouth daily. For vitamin D         Allergies:  Allergies  Allergen Reactions   Chlorine Other (See Comments)    Smell causes lightheadedness.   Dilaudid  [Hydromorphone ]     hallucinations   Codeine Anxiety   Darvon Anxiety   Hydrocodone Anxiety   Meperidine Anxiety   Oxycodone Anxiety   Tramadol  Anxiety   Victoza  [Liraglutide ] Nausea Only and Other (See Comments)    And weakness    Family History: Family History  Problem Relation Age of Onset   Depression Mother    Heart disease Mother    Hypertension Mother    Heart failure Mother        CHF   Diabetes Mother    Cancer Mother        CERVICAL CANCER   Atrial fibrillation Mother    Bipolar disorder Father    Heart disease Father    Diabetes Father    Rashes /  Skin problems Father    Hypertension Father    Cirrhosis Father        liver disease non alcoholic   Bipolar disorder Sister    Diabetes Sister    Breast cancer Sister 46   Diabetes Mellitus II Sister    Heart disease Brother    Heart failure Brother        CHF   Diabetes Brother    Cancer Brother 74       THROAT AND NECK   Esophageal  cancer Brother    Lung cancer Brother    Atrial fibrillation Brother    Cancer Maternal Grandfather    Colon cancer Neg Hx    Stomach cancer Neg Hx    Rectal cancer Neg Hx     Social History:  reports that she has never smoked. She has never used smokeless tobacco. She reports that she does not drink alcohol and does not use drugs.   Physical Exam: BP 135/77   Pulse (!) 112   Ht 5\' 2"  (1.575 m)   Wt 158 lb 9 oz (71.9 kg)   BMI 29.00 kg/m   Constitutional:  Alert and oriented, No acute distress. HEENT:  AT, moist mucus membranes.  Trachea midline, no masses. Cardiovascular: No clubbing, cyanosis, or edema. Respiratory: Normal respiratory effort, no increased work of breathing. GI: Abdomen is soft, nontender, nondistended, no abdominal masses Skin: No rashes, bruises or suspicious lesions. Neurologic: Grossly intact, no focal deficits, moving all 4 extremities. Psychiatric: Normal mood and affect.   Assessment & Plan:    1. Lower Urinary Tract Symptoms Primarily urinary hesitancy and urgency. Patient has decreased Tamsulosin from 0.8 mg to 0.4 mg. Discussed trial of stopping Tamsulosin with the option to restart if symptoms worsen. PVR today is 1 mL.  2. History of Recurrent UTIs Urinalysis today is clear. No recent infections.  Follow up in one year, with instructions to call earlier for any significant change in symptoms.  I have reviewed the above documentation for accuracy and completeness, and I agree with the above.   Geraline Knapp, MD  [facility] [address] [place],  [place] [address] [phone]

## 2023-12-26 ENCOUNTER — Encounter

## 2023-12-29 ENCOUNTER — Other Ambulatory Visit: Payer: Self-pay | Admitting: Primary Care

## 2023-12-29 DIAGNOSIS — K219 Gastro-esophageal reflux disease without esophagitis: Secondary | ICD-10-CM

## 2023-12-29 NOTE — Telephone Encounter (Signed)
 Patient is due for follow up in September, this will be required prior to any further refills.  Please schedule, thank you!

## 2023-12-30 NOTE — Telephone Encounter (Signed)
lvm for pt to call office to reschedule appt

## 2023-12-30 NOTE — Telephone Encounter (Signed)
 lvm for pt to call office to schedule appt.

## 2024-01-12 ENCOUNTER — Encounter: Admitting: Internal Medicine

## 2024-01-13 DIAGNOSIS — L309 Dermatitis, unspecified: Secondary | ICD-10-CM | POA: Diagnosis not present

## 2024-01-13 DIAGNOSIS — S20162A Insect bite (nonvenomous) of breast, left breast, initial encounter: Secondary | ICD-10-CM | POA: Diagnosis not present

## 2024-01-17 ENCOUNTER — Other Ambulatory Visit: Payer: Self-pay | Admitting: Primary Care

## 2024-01-17 ENCOUNTER — Ambulatory Visit: Payer: Self-pay | Admitting: Primary Care

## 2024-01-17 ENCOUNTER — Encounter: Payer: Self-pay | Admitting: Primary Care

## 2024-01-17 ENCOUNTER — Ambulatory Visit: Admitting: Primary Care

## 2024-01-17 VITALS — BP 122/80 | HR 104 | Temp 98.8°F | Ht 62.0 in | Wt 168.0 lb

## 2024-01-17 DIAGNOSIS — F330 Major depressive disorder, recurrent, mild: Secondary | ICD-10-CM

## 2024-01-17 DIAGNOSIS — E538 Deficiency of other specified B group vitamins: Secondary | ICD-10-CM

## 2024-01-17 DIAGNOSIS — I10 Essential (primary) hypertension: Secondary | ICD-10-CM

## 2024-01-17 DIAGNOSIS — E6609 Other obesity due to excess calories: Secondary | ICD-10-CM

## 2024-01-17 DIAGNOSIS — R7303 Prediabetes: Secondary | ICD-10-CM

## 2024-01-17 DIAGNOSIS — E782 Mixed hyperlipidemia: Secondary | ICD-10-CM

## 2024-01-17 DIAGNOSIS — F418 Other specified anxiety disorders: Secondary | ICD-10-CM

## 2024-01-17 DIAGNOSIS — E559 Vitamin D deficiency, unspecified: Secondary | ICD-10-CM | POA: Diagnosis not present

## 2024-01-17 DIAGNOSIS — G43709 Chronic migraine without aura, not intractable, without status migrainosus: Secondary | ICD-10-CM | POA: Diagnosis not present

## 2024-01-17 DIAGNOSIS — Z8781 Personal history of (healed) traumatic fracture: Secondary | ICD-10-CM

## 2024-01-17 DIAGNOSIS — M8580 Other specified disorders of bone density and structure, unspecified site: Secondary | ICD-10-CM | POA: Diagnosis not present

## 2024-01-17 DIAGNOSIS — E2839 Other primary ovarian failure: Secondary | ICD-10-CM | POA: Diagnosis not present

## 2024-01-17 DIAGNOSIS — E611 Iron deficiency: Secondary | ICD-10-CM

## 2024-01-17 DIAGNOSIS — G3184 Mild cognitive impairment, so stated: Secondary | ICD-10-CM

## 2024-01-17 DIAGNOSIS — D509 Iron deficiency anemia, unspecified: Secondary | ICD-10-CM

## 2024-01-17 DIAGNOSIS — E66811 Obesity, class 1: Secondary | ICD-10-CM

## 2024-01-17 DIAGNOSIS — K219 Gastro-esophageal reflux disease without esophagitis: Secondary | ICD-10-CM | POA: Diagnosis not present

## 2024-01-17 DIAGNOSIS — Z6830 Body mass index (BMI) 30.0-30.9, adult: Secondary | ICD-10-CM

## 2024-01-17 DIAGNOSIS — N3090 Cystitis, unspecified without hematuria: Secondary | ICD-10-CM

## 2024-01-17 DIAGNOSIS — R32 Unspecified urinary incontinence: Secondary | ICD-10-CM

## 2024-01-17 LAB — VITAMIN B12: Vitamin B-12: 300 pg/mL (ref 211–911)

## 2024-01-17 LAB — IBC + FERRITIN
Ferritin: 13.9 ng/mL (ref 10.0–291.0)
Iron: 122 ug/dL (ref 42–145)
Saturation Ratios: 28.7 % (ref 20.0–50.0)
TIBC: 425.6 ug/dL (ref 250.0–450.0)
Transferrin: 304 mg/dL (ref 212.0–360.0)

## 2024-01-17 LAB — HEPATIC FUNCTION PANEL
ALT: 13 U/L (ref 0–35)
AST: 16 U/L (ref 0–37)
Albumin: 4.3 g/dL (ref 3.5–5.2)
Alkaline Phosphatase: 107 U/L (ref 39–117)
Bilirubin, Direct: 0 mg/dL (ref 0.0–0.3)
Total Bilirubin: 0.3 mg/dL (ref 0.2–1.2)
Total Protein: 6.5 g/dL (ref 6.0–8.3)

## 2024-01-17 LAB — CBC
HCT: 43.9 % (ref 36.0–46.0)
Hemoglobin: 14.3 g/dL (ref 12.0–15.0)
MCHC: 32.7 g/dL (ref 30.0–36.0)
MCV: 92.6 fl (ref 78.0–100.0)
Platelets: 263 10*3/uL (ref 150.0–400.0)
RBC: 4.74 Mil/uL (ref 3.87–5.11)
RDW: 13.6 % (ref 11.5–15.5)
WBC: 7.3 10*3/uL (ref 4.0–10.5)

## 2024-01-17 LAB — LIPID PANEL
Cholesterol: 187 mg/dL (ref 0–200)
HDL: 68.7 mg/dL (ref >39.00)
LDL Cholesterol: 79 mg/dL (ref 0–99)
NonHDL: 118.27
Total CHOL/HDL Ratio: 3
Triglycerides: 194 mg/dL — ABNORMAL HIGH (ref 0.0–149.0)
VLDL: 38.8 mg/dL (ref 0.0–40.0)

## 2024-01-17 LAB — HEMOGLOBIN A1C: Hgb A1c MFr Bld: 5.9 % (ref 4.6–6.5)

## 2024-01-17 LAB — VITAMIN D 25 HYDROXY (VIT D DEFICIENCY, FRACTURES): VITD: 31.93 ng/mL (ref 30.00–100.00)

## 2024-01-17 MED ORDER — SEMAGLUTIDE-WEIGHT MANAGEMENT 0.25 MG/0.5ML ~~LOC~~ SOAJ: 0.2500 mg | SUBCUTANEOUS | 0 refills | Status: DC

## 2024-01-17 MED ORDER — CYANOCOBALAMIN 500 MCG PO TABS: 500.0000 ug | ORAL_TABLET | Freq: Every day | ORAL | 0 refills | Status: DC

## 2024-01-17 MED ORDER — PROMETHAZINE HCL 12.5 MG RE SUPP
12.5000 mg | Freq: Four times a day (QID) | RECTAL | 0 refills | Status: AC | PRN
Start: 2024-01-17 — End: ?

## 2024-01-17 NOTE — Assessment & Plan Note (Signed)
 Repeat vitamin D  pending.   Continue 2000 international units  daily.

## 2024-01-17 NOTE — Patient Instructions (Addendum)
 Start semaglutide [facility]) for weight loss. Start by injecting 0.25 mg into the skin once weekly for 4 weeks, then increase to 0.5 mg once weekly thereafter.  Please notify me once you have used your last 0.25 mg pen so that I can send the 0.5 mg dose to your pharmacy.  Stop by the lab prior to leaving today. I will notify you of your results once received.   [facility] density scan    It was a pleasure to see you today!

## 2024-01-17 NOTE — Assessment & Plan Note (Signed)
 Controlled.  Following with urology, office notes reviewed from June 2025.

## 2024-01-17 NOTE — Progress Notes (Signed)
 Subjective:    Patient ID: Katherine [name], female    DOB: 1950/10/21, 73 y.o.   MRN: [id number]  HPI  Katherine [name] is a very pleasant 73 y.o. female with a history of hypertension, migraines, OSA, chronic fatigue, prediabetes, B12 deficiency, mild cognitive impairment who presents today for follow-up of chronic conditions.  1) Migraines: Currently managed on Topamax  50 mg in a.m. and 100 mg in p.m. for migraine prevention. She is also managed sumatriptan  100 mg PRN. She feels well managed on this regimen. Her migraines are overall infrequent. She was once managed on promethazine  rectally for nausea, used her last suppository last week. She is needing refills.   2) Anxiety/Depression: Following with psychiatry and is managed on Abilify  15 mg daily, temazepam 30 mg HS, duloxetine  90 mg daily. Overall feels well managed on this regimen, is sleeping well with temazepam. She's frustrated with her weight gain since starting Abilify  as it causes increased appetite. She is trying to make healthier choices but has a hard time controlling her appetite. She is staying active at home. She would like to try one of the GLP-1 agonist medications for appetite suppression.  3) GERD: Currently managed on pantoprazole  40 mg twice daily. Feels well managed on this regimen.  No breakthrough esophageal reflux.  4) Hyperlipidemia/OSA/Hypertension: Following with cardiology and is managed on propranolol  20 mg 3 times daily as needed for tachycardia, rosuvastatin  5 mg daily, spironolactone  25 mg daily. She has gained >20 pounds this year since going on Abilify  due to increased appetite.  She is trying to stay active in her garden and throughout the house.  5) Recurrent UTI/Urinary incontinence: Following with Urology and is managed on tamsulosin 0.4 mg daily which is reduced back from 0.8 mg daily. She is now currently off all tamsulosin. She is doing well without tamsulosin, infrequent urgency with leakage.    Wt Readings from Last 3 Encounters:  01/17/24 168 lb (76.2 kg)  12/21/23 158 lb 9 oz (71.9 kg)  08/01/23 144 lb (65.3 kg)   BP Readings from Last 3 Encounters:  01/17/24 122/80  12/21/23 135/77  06/14/23 (!) 144/82      Review of Systems  Constitutional:  Negative for fatigue.  Respiratory:  Negative for shortness of breath.   Cardiovascular:  Negative for chest pain.  Gastrointestinal:  Negative for constipation and diarrhea.  Neurological:  Negative for headaches.  Psychiatric/Behavioral:  The patient is not nervous/anxious.          Past Medical History:  Diagnosis Date   Anemia 2018   Anxiety    Arthritis    Borderline diabetes    Cataract    Chest pain    a. 2007 Cath: nl cors.  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 03/2022 MV: EF >65%, no ischemia/infarct. No signif cor/Ao Ca2+. Low risk.   Chicken pox    age 58   Chronic back pain    Compressed discs. [facility]   Closed comminuted left humeral fracture 06/14/2023   Dark brown-colored urine 03/22/2023   Depression    Diastolic dysfunction    a. 06/2020 Echo: EF 60-65%, no rwma, GrI DD, nl RV size/fxn. RVSP 30.5 mmHg.   Excessive weight loss 03/22/2023   GERD (gastroesophageal reflux disease)    High cholesterol    Hypertension    Hypokalemia    Migraine headache    Obesity    Parkinson's disease (HCC)    Pre-diabetes  okay now    Sinus tachycardia    a. 02/2022 Zio: predominantly sinus rhythm, avg of 99 bpm (77-134). Rare PACs/PVCs. Triggered events = sinus rhythm.    Social History   Socioeconomic History   Marital status: Married    Spouse name: Not on file   Number of children: 1   Years of education: Not on file   Highest education level: Not on file  Occupational History   Occupation: Magazine features editor: [employer]    Comment: retired-special needs teacher  Tobacco Use   Smoking status: Never    Smokeless tobacco: Never  Vaping Use   Vaping status: Never Used  Substance and Sexual Activity   Alcohol use: No   Drug use: No   Sexual activity: Yes    Partners: Male    Birth control/protection: Post-menopausal  Other Topics Concern   Not on file  Social History Narrative   Married.   One son is 67, lives in [place].   Retired from Agricultural consultant.   Enjoys substitute teaching, gardening, cooking.   Social Drivers of Corporate investment banker Strain: Low Risk  (08/01/2023)   Overall Financial Resource Strain (CARDIA)    Difficulty of Paying Living Expenses: Not hard at all  Food Insecurity: No Food Insecurity (08/01/2023)   Hunger Vital Sign    Worried About Running Out of Food in the Last Year: Never true    Ran Out of Food in the Last Year: Never true  Transportation Needs: No Transportation Needs (08/01/2023)   PRAPARE - Administrator, Civil Service (Medical): No    Lack of Transportation (Non-Medical): No  Physical Activity: Inactive (08/01/2023)   Exercise Vital Sign    Days of Exercise per Week: 0 days    Minutes of Exercise per Session: 0 min  Stress: No Stress Concern Present (08/01/2023)   [employer] of Occupational Health - Occupational Stress Questionnaire    Feeling of Stress : Only a little  Social Connections: Moderately Integrated (08/01/2023)   Social Connection and Isolation Panel    Frequency of Communication with Friends and Family: More than three times a week    Frequency of Social Gatherings with Friends and Family: Once a week    Attends Religious Services: More than 4 times per year    Active Member of [employer] or Organizations: No    Attends Banker Meetings: Never    Marital Status: Married  Catering manager Violence: Not At Risk (08/01/2023)   Humiliation, Afraid, Rape, and Kick questionnaire    Fear of Current or Ex-Partner: No    Emotionally Abused: No    Physically Abused: No    Sexually Abused: No    Past  Surgical History:  Procedure Laterality Date   BLADDER SUSPENSION  2005   CARDIAC CATHETERIZATION     CESAREAN SECTION     CLOSED REDUCTION NASAL FRACTURE N/A 01/11/2020   Procedure: CLOSED REDUCTION NASAL FRACTURE;  Surgeon: Herminio Miu, MD;  Location: [facility];  Service: ENT;  Laterality: N/A;   ENDOMETRIAL ABLATION  2007   FOOT SURGERY     bilateral bunions   LUMBAR LAMINECTOMY/DECOMPRESSION MICRODISCECTOMY Bilateral 01/12/2016   Procedure: Bilateral L4-5 Laminotomy/Foraminotomy;  Surgeon: Reyes Budge, MD;  Location: [facility];  Service: Neurosurgery;  Laterality: Bilateral;  Bilateral L4-5 Laminotomy/Foraminotomy   TONSILLECTOMY  1971    Family History  Problem Relation Age of Onset   Depression Mother    Heart  disease Mother    Hypertension Mother    Heart failure Mother        CHF   Diabetes Mother    Cancer Mother        CERVICAL CANCER   Atrial fibrillation Mother    Bipolar disorder Father    Heart disease Father    Diabetes Father    Rashes / Skin problems Father    Hypertension Father    Cirrhosis Father        liver disease non alcoholic   Bipolar disorder Sister    Diabetes Sister    Breast cancer Sister 20   Diabetes Mellitus II Sister    Heart disease Brother    Heart failure Brother        CHF   Diabetes Brother    Cancer Brother 72       THROAT AND NECK   Esophageal cancer Brother    Lung cancer Brother    Atrial fibrillation Brother    Cancer Maternal Grandfather    Colon cancer Neg Hx    Stomach cancer Neg Hx    Rectal cancer Neg Hx     Allergies  Allergen Reactions   Chlorine Other (See Comments)    Smell causes lightheadedness.   Dilaudid  [Hydromorphone ]     hallucinations   Trazodone  And Nefazodone    Codeine Anxiety   Darvon Anxiety   Hydrocodone Anxiety   Meperidine Anxiety   Oxycodone Anxiety   Tramadol  Anxiety   Victoza  [Liraglutide ] Nausea Only and Other (See Comments)    And weakness    Current  Outpatient Medications on File Prior to Visit  Medication Sig Dispense Refill   ARIPiprazole  (ABILIFY ) 30 MG tablet Take 15 mg by mouth daily.     Cholecalciferol (VITAMIN D3) 50 MCG (2000 UT) capsule Take 1 capsule (2,000 Units total) by mouth daily. For vitamin D  90 capsule 3   DULoxetine  (CYMBALTA ) 30 MG capsule Take 30 mg by mouth daily.     DULoxetine  (CYMBALTA ) 60 MG capsule Take 60 mg by mouth every morning. Takes with her 30mg  capsule to equal 90 mg daily     levocetirizine (XYZAL ) 5 MG tablet TAKE 1 TABLET BY MOUTH AT BEDTIME AS NEEDED FOR ALLERGIES. 90 tablet 0   Multiple Vitamin (MULTIVITAMIN) tablet Take 1 tablet by mouth daily.     pantoprazole  (PROTONIX ) 40 MG tablet TAKE 1 TABLET (40 MG TOTAL) BY MOUTH 2 (TWO) TIMES DAILY BEFORE A MEAL. FOR HEARTBURN. 180 tablet 0   propranolol  (INDERAL ) 20 MG tablet TAKE 1 TABLET (20 MG TOTAL) BY MOUTH 3 (THREE) TIMES DAILY AS NEEDED. FOR HEARTRATE >110 BPM 90 tablet 2   rosuvastatin  (CRESTOR ) 5 MG tablet Take 1 tablet (5 mg total) by mouth daily. for cholesterol. 90 tablet 3   SUMAtriptan  (IMITREX ) 100 MG tablet Take 100 mg by mouth every 2 (two) hours as needed for migraine. May repeat in 2 hours if headache persists or recurs.     temazepam (RESTORIL) 30 MG capsule Take 30 mg by mouth at bedtime as needed for sleep.     topiramate  (TOPAMAX ) 50 MG tablet Take 1 tablet by mouth every morning and 2 tablets at night for migraine prevention. 270 tablet 1   No current facility-administered medications on file prior to visit.    BP 122/80   Pulse (!) 104   Temp 98.8 F (37.1 C) (Oral)   Ht 5' 2 (1.575 m)   Wt 168 lb (76.2 kg)  SpO2 99%   BMI 30.73 kg/m  Objective:   Physical Exam  Cardiovascular:     Rate and Rhythm: Normal rate and regular rhythm.  Pulmonary:     Effort: Pulmonary effort is normal.     Breath sounds: Normal breath sounds.   Musculoskeletal:     Cervical back: Neck supple.   Skin:    General: Skin is warm and  dry.   Neurological:     Mental Status: She is alert and oriented to person, place, and time.   Psychiatric:        Mood and Affect: Mood normal.           Assessment & Plan:  HYPERTENSION, BENIGN Assessment & Plan: Controlled.  Continue spironolactone  25 mg daily.  Orders: -     Semaglutide-Weight Management; Inject 0.25 mg into the skin once a week.  Dispense: 2 mL; Refill: 0  History of left shoulder fracture Assessment & Plan: Following with orthopedic surgery and PT. Improved with cortisone injection.    Vitamin D  deficiency Assessment & Plan: Repeat vitamin D  pending.   Continue 2000 international units  daily.  Orders: -     VITAMIN D  25 Hydroxy (Vit-D Deficiency, Fractures)  Vitamin B 12 deficiency -     Vitamin B12  Mixed hyperlipidemia Assessment & Plan: Repeat lipid panel pending, add LFTs. Continue rosuvastatin  5 mg daily.  Orders: -     Hepatic function panel -     Lipid panel -     Semaglutide-Weight Management; Inject 0.25 mg into the skin once a week.  Dispense: 2 mL; Refill: 0  Prediabetes Assessment & Plan: Repeat A1C pending.  Discussed the importance of a healthy diet and regular exercise in order for weight loss, and to reduce the risk of further co-morbidity.   Orders: -     Hemoglobin A1c -     Semaglutide-Weight Management; Inject 0.25 mg into the skin once a week.  Dispense: 2 mL; Refill: 0  Iron deficiency anemia, unspecified iron deficiency anemia type -     IBC + Ferritin -     CBC  Chronic migraine without aura without status migrainosus, not intractable Assessment & Plan: Controlled overall.  Continue topiramate  50 mg a.m. and 100 mg in p.m. Continue sumatriptan  100 mg as needed. Refill provided for promethazine  12.5 mg to use as needed for nausea secondary to migraine.  Orders: -     Promethazine  HCl; Place 1 suppository (12.5 mg total) rectally every 6 (six) hours as needed for nausea or vomiting.  Dispense:  12 each; Refill: 0  Gastroesophageal reflux disease, unspecified whether esophagitis present Assessment & Plan: Controlled.  Continue pantoprazole  40 mg twice daily.    Estrogen deficiency -     DG Bone Density; Future  Osteopenia, unspecified location Assessment & Plan: Continue calcium  and vitamin D  daily. Commended her on weightbearing exercise.  Repeat bone density scan ordered and pending.   Recurrent cystitis Assessment & Plan: Controlled.  Following with urology, office notes reviewed from June 2025.   Mild episode of recurrent major depressive disorder [facility]) Assessment & Plan: Improved!  Continue Abilify  15 mg daily, Cymbalta  90 mg daily. We had long discussion regarding her increased appetite and to work on fighting her cravings.    Iron deficiency Assessment & Plan: Repeat iron studies and CBC pending.   Class 1 obesity due to excess calories without serious comorbidity with body mass index (BMI) of 30.0 to 30.9 in adult Assessment & Plan:  We had a long discussion regarding her weight gain and ways to combat her food cravings. She will contact psychiatry to see if that dose reduction in her Abilify  would be warranted.  She does qualify for GLP-1 agonist treatment given her medical history and BMI today.  I am reticent to proceed given her prior history of excessive weight loss.  She prefers to proceed so we will try [facility].  We discussed instructions for administration and potential side effects.  She does have a noted allergy to victoza  (nausea/weakness), we discussed that these medications are in the same class.  She still would like to proceed.  Start semaglutide [facility]) for weight loss. Start by injecting 0.25 mg into the skin once weekly for 4 weeks, then increase to 0.5 mg once weekly thereafter.    Follow-up TBD  Orders: -     Semaglutide-Weight Management; Inject 0.25 mg into the skin once a week.  Dispense: 2 mL; Refill: 0  Other specified  anxiety disorders Assessment & Plan: Controlled.  Continue Abilify  15 mg daily, Cymbalta  90 mg daily. Following with psychiatry     Urinary incontinence, unspecified type Assessment & Plan: Controlled.  Following with urology, office notes reviewed from June 2025. Remain off treatment for now.   Vitamin B12 deficiency Assessment & Plan: Not currently on supplementation except for  multivitamin. Repeat B12 level pending.   Mild cognitive impairment Assessment & Plan: Controlled.  Continue Nuplazid .          Benjamine Strout K Kermit Arnette, NP

## 2024-01-17 NOTE — Assessment & Plan Note (Signed)
 Controlled.  Continue Abilify  15 mg daily, Cymbalta  90 mg daily. Following with psychiatry

## 2024-01-17 NOTE — Assessment & Plan Note (Addendum)
 We had a long discussion regarding her weight gain and ways to combat her food cravings. She will contact psychiatry to see if that dose reduction in her Abilify  would be warranted.  She does qualify for GLP-1 agonist treatment given her medical history and BMI today.  I am reticent to proceed given her prior history of excessive weight loss.  She prefers to proceed so we will try [facility].  We discussed instructions for administration and potential side effects.  She does have a noted allergy to victoza  (nausea/weakness), we discussed that these medications are in the same class.  She still would like to proceed.  Start semaglutide [facility]) for weight loss. Start by injecting 0.25 mg into the skin once weekly for 4 weeks, then increase to 0.5 mg once weekly thereafter.    Follow-up TBD

## 2024-01-17 NOTE — Assessment & Plan Note (Signed)
 Continue calcium  and vitamin D  daily. Commended her on weightbearing exercise.  Repeat bone density scan ordered and pending.

## 2024-01-17 NOTE — Assessment & Plan Note (Signed)
 Following with orthopedic surgery and PT. Improved with cortisone injection.

## 2024-01-17 NOTE — Assessment & Plan Note (Signed)
Repeat A1C pending.  Discussed the importance of a healthy diet and regular exercise in order for weight loss, and to reduce the risk of further co-morbidity.  

## 2024-01-17 NOTE — Assessment & Plan Note (Signed)
 Improved!  Continue Abilify  15 mg daily, Cymbalta  90 mg daily. We had long discussion regarding her increased appetite and to work on fighting her cravings.

## 2024-01-17 NOTE — Assessment & Plan Note (Signed)
 Controlled overall.  Continue topiramate  50 mg a.m. and 100 mg in p.m. Continue sumatriptan  100 mg as needed. Refill provided for promethazine  12.5 mg to use as needed for nausea secondary to migraine.

## 2024-01-17 NOTE — Assessment & Plan Note (Signed)
 Not currently on supplementation except for  multivitamin. Repeat B12 level pending.

## 2024-01-17 NOTE — Assessment & Plan Note (Signed)
Controlled. ? ?Continue pantoprazole 40 mg twice daily. ?

## 2024-01-17 NOTE — Assessment & Plan Note (Signed)
 Repeat lipid panel pending, add LFTs. Continue rosuvastatin  5 mg daily.

## 2024-01-17 NOTE — Assessment & Plan Note (Signed)
 Controlled.  Following with urology, office notes reviewed from June 2025. Remain off treatment for now.

## 2024-01-17 NOTE — Assessment & Plan Note (Signed)
 Controlled.  Continue spironolactone 25 mg daily.

## 2024-01-17 NOTE — Assessment & Plan Note (Signed)
 Controlled.  Continue Nuplazid .

## 2024-01-17 NOTE — Assessment & Plan Note (Signed)
Repeat iron studies and CBC pending.

## 2024-01-18 ENCOUNTER — Other Ambulatory Visit: Payer: Self-pay | Admitting: Primary Care

## 2024-01-18 DIAGNOSIS — E782 Mixed hyperlipidemia: Secondary | ICD-10-CM

## 2024-01-18 DIAGNOSIS — E6609 Other obesity due to excess calories: Secondary | ICD-10-CM

## 2024-01-18 DIAGNOSIS — R7303 Prediabetes: Secondary | ICD-10-CM

## 2024-01-18 MED ORDER — TIRZEPATIDE-WEIGHT MANAGEMENT 2.5 MG/0.5ML ~~LOC~~ SOLN: 2.5000 mg | SUBCUTANEOUS | 0 refills | Status: DC

## 2024-01-23 DIAGNOSIS — S42292A Other displaced fracture of upper end of left humerus, initial encounter for closed fracture: Secondary | ICD-10-CM | POA: Diagnosis not present

## 2024-01-27 ENCOUNTER — Other Ambulatory Visit: Payer: Self-pay | Admitting: Primary Care

## 2024-01-27 DIAGNOSIS — J302 Other seasonal allergic rhinitis: Secondary | ICD-10-CM

## 2024-01-27 DIAGNOSIS — F3131 Bipolar disorder, current episode depressed, mild: Secondary | ICD-10-CM | POA: Diagnosis not present

## 2024-02-28 ENCOUNTER — Ambulatory Visit: Admitting: Primary Care

## 2024-03-05 ENCOUNTER — Telehealth: Payer: Self-pay | Admitting: Cardiovascular Disease

## 2024-03-05 ENCOUNTER — Encounter: Payer: Self-pay | Admitting: Internal Medicine

## 2024-03-05 DIAGNOSIS — S42292A Other displaced fracture of upper end of left humerus, initial encounter for closed fracture: Secondary | ICD-10-CM | POA: Diagnosis not present

## 2024-03-05 NOTE — Telephone Encounter (Signed)
 Spoke with patient and reviewed her concerns. She expresses a sense of urgency to get in to see someone. She reports excessive swelling to her lower extremities and some in her hands. She just wants to come in and be seen. Scheduled her to come in tomorrow morning and to call back if any problems before then. She was appreciative with no further questions at this time.

## 2024-03-05 NOTE — Telephone Encounter (Signed)
 Spoke with patient and she said this was from previous call. We have already discussed her symptoms and appointment has been scheduled for her to come in tomorrow. No further needs at this time.

## 2024-03-05 NOTE — Telephone Encounter (Signed)
 Pt c/o swelling: STAT is pt has developed SOB within 24 hours  How much weight have you gained and in what time span?  10-15 lbs in the past 2 months   If swelling, where is the swelling located?  Feet and hands, tingling in legs  Are you currently taking a fluid pill?  No, hasn't been on one in years  Are you currently SOB?  No   Do you have a log of your daily weights (if so, list)?   Have you gained 3 pounds in a day or 5 pounds in a week?   Have you traveled recently?  No

## 2024-03-05 NOTE — Telephone Encounter (Signed)
  Pt c/o swelling/edema: STAT if pt has developed SOB within 24 hours  If swelling, where is the swelling located? Feet and ankles  How much weight have you gained and in what time span? Last couple months 15 pds but swelling just started couple days ago  Have you gained 2 pounds in a day or 5 pounds in a week? no  Do you have a log of your daily weights (if so, list)? no  Are you currently taking a fluid pill? no  Are you currently SOB? no  Have you traveled recently in a car or plane for an extended period of time? No  STAT if HR is under 50 or over 120  (normal HR is 60-100 beats per minute)  What is your heart rate? 100, not sure what it is today  Do you have a log of your heart rate readings (document readings)? no  Do you have any other symptoms? Headaches and little bit of dizziness

## 2024-03-05 NOTE — Progress Notes (Signed)
 [facility] ORT [facility]  Mon 03/05/2024  Orthopedic Established Patient Note  Patient ID:  Katherine [name] is a 73 y.o. female     Assessment:  Left shoulder comminuted proximal humerus fracture, healed well but with some adhesive capsulitis.  Good response to previous steroid injection glenohumeral joint.    Plan:  Needs to continue to be aggressive with her range of motion exercises.  I went through these again with her.  She has already done formal physical therapy.  Injected in the left glenohumeral joint today at her request.  Call for problems.    Large joint arthrocentesis: L glenohumeral on 03/05/2024 9:10 AMMedications: 40 mg triamcinolone  acetonide 40 mg/mL Outcome: tolerated well, no immediate complications Procedure, treatment alternatives, risks and benefits explained, specific risks discussed. Immediately prior to procedure a time out was called to verify the correct patient, procedure, equipment, support staff and site/side marked as required. Patient was prepped and draped in the usual sterile fashion.      Chief Complaint:   left shoulder pain s/p comminuted proximal humerus fracture    History of Present Illness:  Left shoulder pain has increased a little lately.  She had good improvement from the previous steroid injection.  Has been able to move a little better.  Now getting more stiff due to discomfort.  No interval injuries.  No signs of infection.  Feels a little weak and stiff.  Some night pain.  Vital Signs  There were no vitals taken for this visit. There is no height or weight on file to calculate BMI.  Physical Exam  Right shoulder has full range of motion without pain tenderness weakness or instability.  The left shoulder gets 80 degrees of forward elevation and abduction.  Passively she gets about 60 degrees glenohumeral abduction.  Moderate weakness.  No crepitus.  Minimal tenderness.  Both hands have normal sensation and brisk capillary refill  distally.  Radiology  No results found.   Allergies  Patient has no known allergies.   Medications    Current Medications[1]   Past Medical History  Medical History[2]   Past Surgical History  Surgical History[3]   Family History  Family History[4]   Social History:  Social History[5]   Review of Systems  Review of Systems       All other systems are reviewed and are negative except as per HPI.  Problem List  Problem List[6]     Vinie KYM Fonder, MD         Note - This record has been created using Dragon software. Chart creation errors have been sought, but may not always have been located. Such creation errors do not reflect on the standard of medical care.          [1] Current Outpatient Medications  Medication Sig Dispense Refill  . diclofenac (VOLTAREN) 75 mg EC tablet Take 1 tablet (75 mg total) by mouth 2 (two) times a day. 60 tablet 0  . diclofenac (VOLTAREN) 75 mg EC tablet Take 1 tablet (75 mg total) by mouth 2 (two) times a day. 60 tablet 2  . diclofenac (VOLTAREN) 75 mg EC tablet Take 1 tablet (75 mg total) by mouth 2 (two) times a day. 60 tablet 2   No current facility-administered medications for this visit.  [2] No past medical history on file. [3] No past surgical history on file. [4] No family history on file. [5] Social History Socioeconomic History  . Marital status: Unknown  Tobacco Use  .  Smoking status: Never  . Smokeless tobacco: Never   Social Drivers of Health   Food Insecurity: No Food Insecurity (08/01/2023)   Received from [facility]   Food vital sign   . Within the past 12 months, you worried that your food would run out before you got money to buy more: Never true   . Within the past 12 months, the food you bought just didn't last and you didn't have money to get more: Never true  Transportation Needs: No Transportation Needs (08/01/2023)   Received from [facility] -  Transportation   . Lack of Transportation (Medical): No   . Lack of Transportation (Non-Medical): No  Safety: Not At Risk (08/01/2023)   Received from [facility]   Safety   . Within the last year, have you been afraid of your partner or ex-partner?: No   . Within the last year, have you been humiliated or emotionally abused in other ways by your partner or ex-partner?: No   . Within the last year, have you been kicked, hit, slapped, or otherwise physically hurt by your partner or ex-partner?: No   . Within the last year, have you been raped or forced to have any kind of sexual activity by your partner or ex-partner?: No  Living Situation: Unknown (08/01/2023)   Received from [facility] Situation   . In the last 12 months, was there a time when you were not able to pay the mortgage or rent on time?: No   . At any time in the past 12 months, were you homeless or living in a shelter (including now)?: No  [6] There is no problem list on file for this patient.

## 2024-03-05 NOTE — Progress Notes (Unsigned)
 Cardiology Office Note    Date:  03/06/2024   ID:  Nakyiah, Kuck 07-Oct-1950, MRN [id number]  PCP:  Gretta Comer POUR, NP  Cardiologist:  Evalene Lunger, MD  Electrophysiologist:  None   Chief Complaint: Lower extremity edema  History of Present Illness:   Katherine [name] is a 73 y.o. female with history of hypertension, hyperlipidemia, migraines, OSA, chronic fatigue, prediabetes, B12 deficiency, and mild cognitive impairment who presents for evaluation of lower extremity edema.    Patient underwent cardiac catheterization in 2007 which showed normal coronary arteries.  She underwent echo in 2010 which showed EF 65% without significant valvular disease or wall motion abnormality.  She was seen in 2019 and reported chronic dyspnea on exertion that limited her activities.  She also noted exertional substernal chest discomfort.  She underwent echocardiogram which showed preserved LV systolic function without valvular disease.  Stress testing was completed and did not show evidence of ischemia.  She was seen in clinic 05/2020 and reported atypical chest pain.  Echo was updated and showed preserved LV systolic function with EF 60 to 65%, G1 DD, mild MR, and mild tricuspid regurg.  Patient was seen in 02/2022 reported chest pain with both atypical and typical features.  Lexiscan  Myoview  completed 03/2022 was low risk with no evidence of ischemia or infarction.  EF estimated at 80%.  Event monitor completed 04/2022 showed predominantly sinus rhythm with rare PACs and PVCs.   Patient was most recently seen by Dr. Lunger 04/2023 and overall doing well from a cardiac perspective. She endorsed significant weight loss due to reduced appetite. She was without symptoms of chest pain and shortness of breath.   Patient presents today with concerns for new lower extremity swelling in the past 5 to 6 days.  She reports progressive weight gain for the past several months.  She does endorse that she  has been eating at fast food restaurants more than usual for the past few days as she is in the process of moving and not able to cook at this time.  She also reports headache and mild lightheadedness.  She also reports that she has noticed her heart rate running higher than normal for the past few days.  She has taken her as needed propranolol  for the past 3 days with improvement.  She endorses chronic exertional dyspnea, no worse than usual.  She denies chest pain, palpitations, dizziness, orthopnea, and PND.  She is concerned given family history of congestive heart failure.  Labs independently reviewed: 01/17/2024 -Hgb 14.3, HCT 43.9, A1c 5.9, normal LFTs, TC 187, TG 194, HDL 68, LDL 79 06/03/2023 -K 3.1, BUN 19, creatinine 0.76  Objective   Past Medical History:  Diagnosis Date   Anemia 2018   Anxiety    Arthritis    Borderline diabetes    Cataract    Chest pain    a. 2007 Cath: nl cors.  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 03/2022 MV: EF >65%, no ischemia/infarct. No signif cor/Ao Ca2+. Low risk.   Chicken pox    age 58   Chronic back pain    Compressed discs. [facility]   Closed comminuted left humeral fracture 06/14/2023   Dark brown-colored urine 03/22/2023   Depression    Diastolic dysfunction    a. 06/2020 Echo: EF 60-65%, no rwma, GrI DD, nl RV size/fxn. RVSP 30.5 mmHg.   Excessive weight loss 03/22/2023   GERD (gastroesophageal  reflux disease)    High cholesterol    Hypertension    Hypokalemia    Migraine headache    Obesity    Parkinson's disease (HCC)    Pre-diabetes    okay now    Sinus tachycardia    a. 02/2022 Zio: predominantly sinus rhythm, avg of 99 bpm (77-134). Rare PACs/PVCs. Triggered events = sinus rhythm.    Current Medications: Current Meds  Medication Sig   ARIPiprazole  (ABILIFY ) 30 MG tablet Take 15 mg by mouth daily.   DULoxetine  (CYMBALTA ) 30 MG capsule Take 30 mg by  mouth daily.   DULoxetine  (CYMBALTA ) 60 MG capsule Take 60 mg by mouth every morning. Takes with her 30mg  capsule to equal 90 mg daily   furosemide  (LASIX ) 20 MG tablet Take 1 tablet (20 mg total) by mouth daily as needed for fluid or edema.   Multiple Vitamin (MULTIVITAMIN) tablet Take 1 tablet by mouth daily.   pantoprazole  (PROTONIX ) 40 MG tablet TAKE 1 TABLET (40 MG TOTAL) BY MOUTH 2 (TWO) TIMES DAILY BEFORE A MEAL. FOR HEARTBURN.   promethazine  (PHENERGAN ) 12.5 MG suppository Place 1 suppository (12.5 mg total) rectally every 6 (six) hours as needed for nausea or vomiting.   propranolol  (INDERAL ) 20 MG tablet TAKE 1 TABLET (20 MG TOTAL) BY MOUTH 3 (THREE) TIMES DAILY AS NEEDED. FOR HEARTRATE >110 BPM   rosuvastatin  (CRESTOR ) 5 MG tablet Take 1 tablet (5 mg total) by mouth daily. for cholesterol.   SUMAtriptan  (IMITREX ) 100 MG tablet Take 100 mg by mouth every 2 (two) hours as needed for migraine. May repeat in 2 hours if headache persists or recurs.   temazepam (RESTORIL) 30 MG capsule Take 30 mg by mouth at bedtime as needed for sleep.   tirzepatide  (ZEPBOUND ) 2.5 MG/0.5ML injection vial Inject 2.5 mg into the skin once a week.   topiramate  (TOPAMAX ) 50 MG tablet Take 1 tablet by mouth every morning and 2 tablets at night for migraine prevention.    Allergies:   Chlorine, Dilaudid  [hydromorphone ], Trazodone  and nefazodone, Codeine, Darvon, Hydrocodone, Meperidine, Oxycodone, Tramadol , and Victoza  [liraglutide ]   Social History   Socioeconomic History   Marital status: Married    Spouse name: Not on file   Number of children: 1   Years of education: Not on file   Highest education level: Not on file  Occupational History   Occupation: Magazine features editor: [employer]    Comment: retired-special needs teacher  Tobacco Use   Smoking status: Never   Smokeless tobacco: Never  Vaping Use   Vaping status: Never Used  Substance and Sexual Activity   Alcohol use: No   Drug  use: No   Sexual activity: Yes    Partners: Male    Birth control/protection: Post-menopausal  Other Topics Concern   Not on file  Social History Narrative   Married.   One son is 45, lives in [place].   Retired from Agricultural consultant.   Enjoys substitute teaching, gardening, cooking.   Social Drivers of Corporate investment banker Strain: Low Risk  (08/01/2023)   Overall Financial Resource Strain (CARDIA)    Difficulty of Paying Living Expenses: Not hard at all  Food Insecurity: No Food Insecurity (08/01/2023)   Hunger Vital Sign    Worried About Running Out of Food in the Last Year: Never true    Ran Out of Food in the Last Year: Never true  Transportation Needs: No Transportation Needs (08/01/2023)   PRAPARE - Transportation  Lack of Transportation (Medical): No    Lack of Transportation (Non-Medical): No  Physical Activity: Inactive (08/01/2023)   Exercise Vital Sign    Days of Exercise per Week: 0 days    Minutes of Exercise per Session: 0 min  Stress: No Stress Concern Present (08/01/2023)   [employer] of Occupational Health - Occupational Stress Questionnaire    Feeling of Stress : Only a little  Social Connections: Moderately Integrated (08/01/2023)   Social Connection and Isolation Panel    Frequency of Communication with Friends and Family: More than three times a week    Frequency of Social Gatherings with Friends and Family: Once a week    Attends Religious Services: More than 4 times per year    Active Member of [employer] or Organizations: No    Attends Engineer, structural: Never    Marital Status: Married     Family History:  The patient's family history includes Atrial fibrillation in her brother and mother; Bipolar disorder in her father and sister; Breast cancer (age of onset: 36) in her sister; Cancer in her maternal grandfather and mother; Cancer (age of onset: 49) in her brother; Cirrhosis in her father; Depression in her mother; Diabetes in her  brother, father, mother, and sister; Diabetes Mellitus II in her sister; Esophageal cancer in her brother; Heart disease in her brother, father, and mother; Heart failure in her brother and mother; Hypertension in her father and mother; Lung cancer in her brother; Rashes / Skin problems in her father. There is no history of Colon cancer, Stomach cancer, or Rectal cancer.  ROS:   12-point review of systems is negative unless otherwise noted in the HPI.  EKGs/Other Studies Reviewed:    Studies reviewed were summarized above. The additional studies were reviewed today:  05/04/2022 Long term monitor Normal sinus rhythm Patient had a min HR of 70 bpm, max HR of 134 bpm, and avg HR of 99 bpm.  Isolated SVEs were rare (<1.0%), SVE Couplets were rare (<1.0%), and SVE Triplets were rare (<1.0%).  Isolated VEs were rare (<1.0%), and no VE Couplets or VE Triplets were present.  Patient triggered events (2) associated with normal sinus rhythm  03/19/2022 Lexiscan  myoview    The study is normal. The study is low risk.   No ST deviation was noted.   LV perfusion is normal. There is no evidence of ischemia. There is no evidence of infarction.   Left ventricular function is normal. Nuclear stress EF: 80 %. The left ventricular ejection fraction is hyperdynamic (>65%). End diastolic cavity size is normal. End systolic cavity size is normal.   CT attenuation images with no significant aortic or coronary calcifications.  07/03/2020 Echo complete 1. Left ventricular ejection fraction, by estimation, is 60 to 65%. The  left ventricle has normal function. The left ventricle has no regional  wall motion abnormalities. Left ventricular diastolic parameters are  consistent with Grade I diastolic  dysfunction (impaired relaxation).   2. Right ventricular systolic function is normal. The right ventricular  size is normal. There is normal pulmonary artery systolic pressure. The  estimated right ventricular systolic  pressure is 30.5 mmHg.   EKG:  EKG personally reviewed by me today EKG Interpretation Date/Time:  Tuesday March 06 2024 08:08:53 EDT Ventricular Rate:  97 PR Interval:  126 QRS Duration:  70 QT Interval:  336 QTC Calculation: 426 R Axis:   14  Text Interpretation: Normal sinus rhythm Normal ECG When compared with ECG of 19-Mar-2022  10:21, No significant change was found Confirmed by Lorene Sinclair ([id number]) on 03/06/2024 8:11:15 AM  PHYSICAL EXAM:    VS:  BP 118/66   Pulse 97   Ht 5' 2 (1.575 m)   Wt 179 lb (81.2 kg)   SpO2 94%   BMI 32.74 kg/m   BMI: Body mass index is 32.74 kg/m.  Physical Exam Vitals and nursing note reviewed.  Constitutional:      General: She is not in acute distress.    Appearance: Normal appearance.  Cardiovascular:     Rate and Rhythm: Normal rate and regular rhythm.     Heart sounds: No murmur heard. Pulmonary:     Effort: Pulmonary effort is normal. No respiratory distress.     Breath sounds: No wheezing or rales.  Musculoskeletal:     Right lower leg: Edema (1+) present.     Left lower leg: Edema (1+) present.  Skin:    General: Skin is warm and dry.  Neurological:     General: No focal deficit present.     Mental Status: She is alert and oriented to person, place, and time. Mental status is at baseline.  Psychiatric:        Mood and Affect: Mood normal.        Behavior: Behavior normal.    Wt Readings from Last 3 Encounters:  03/06/24 179 lb (81.2 kg)  01/17/24 168 lb (76.2 kg)  12/21/23 158 lb 9 oz (71.9 kg)       ASSESSMENT & PLAN:   Lower extremity edema - Patient reports 5-6 days of lower extremity edema and gradual weight gain. No shortness of breath or orthopnea. Update echo. Check CBC, BMP, and BNP. Start Lasix  20 mg as needed for lower extremity swelling. Recheck BMP in 1 week. Recommended elevated legs and compression stockings.   Hypertension - BP well controlled with lifestyle. Start Lasix  as above.    Hyperlipidemia - Most recent lipid panel 01/2024 with LDL 79. Continue rosuvastatin  5 mg daily.   Tachycardia - Patient reports heart rate has been running higher than usual the past few days, up to 115 bpm at times. Stable in office today. Check labs above as well as TSH. Continue propranolol  20 mg up to three times daily as needed for HR > 110 bpm.    Disposition: Update echo. Check CBC, BMP, TSH, and BNP. Start Lasix  20 mg daily PRN for lower extremity swelling. Repeat BMP in 1 week. F/u with Dr. Gollan or an APP in 4-6 weeks/after echo.   Medication Adjustments/Labs and Tests Ordered: Current medicines are reviewed at length with the patient today.  Concerns regarding medicines are outlined above. Medication changes, Labs and Tests ordered today are summarized above and listed in the Patient Instructions accessible in Encounters.   Bonney Sinclair Lorene, PA-C 03/06/2024 10:16 AM     [facility] [place] [address] [place], [place] [address] [phone]

## 2024-03-06 ENCOUNTER — Telehealth: Payer: Self-pay | Admitting: Cardiovascular Disease

## 2024-03-06 ENCOUNTER — Encounter: Payer: Self-pay | Admitting: Cardiology

## 2024-03-06 ENCOUNTER — Ambulatory Visit: Attending: Cardiology | Admitting: Physician Assistant

## 2024-03-06 VITALS — BP 118/66 | HR 97 | Ht 62.0 in | Wt 179.0 lb

## 2024-03-06 DIAGNOSIS — R600 Localized edema: Secondary | ICD-10-CM | POA: Insufficient documentation

## 2024-03-06 DIAGNOSIS — I10 Essential (primary) hypertension: Secondary | ICD-10-CM | POA: Diagnosis not present

## 2024-03-06 DIAGNOSIS — E782 Mixed hyperlipidemia: Secondary | ICD-10-CM | POA: Insufficient documentation

## 2024-03-06 DIAGNOSIS — R000 Tachycardia, unspecified: Secondary | ICD-10-CM | POA: Diagnosis not present

## 2024-03-06 MED ORDER — FUROSEMIDE 20 MG PO TABS: 20.0000 mg | ORAL_TABLET | Freq: Every day | ORAL | 3 refills | Status: DC | PRN

## 2024-03-06 NOTE — Patient Instructions (Signed)
 Medication Instructions:  Your physician recommends the following medication changes.  START TAKING: Lasix  20 mg daily as needed for lower extremity swelling   *If you need a refill on your cardiac medications before your next appointment, please call your pharmacy*  Lab Work: Your provider would like for you to have following labs drawn today CBC, BMP, TSH, BNP.   If you have labs (blood work) drawn today and your tests are completely normal, you will receive your results only by: MyChart Message (if you have MyChart) OR A paper copy in the mail If you have any lab test that is abnormal or we need to change your treatment, we will call you to review the results.  Your provider would like for you to return in 1 week to have the following labs drawn: BMP.   Please go to [facility] [address] ([facility]) [address], [place] [address] You do not need an appointment.  They are open from 8 am- 4:30 pm.  Lunch from 1:00 pm- 2:00 pm You DO NOT need to be fasting.  Testing/Procedures: Your physician has requested that you have an echocardiogram. Echocardiography is a painless test that uses sound waves to create images of your heart. It provides your doctor with information about the size and shape of your heart and how well your heart's chambers and valves are working.   You may receive an ultrasound enhancing agent through an IV if needed to better visualize your heart during the echo. This procedure takes approximately one hour.  There are no restrictions for this procedure.  This will take place at [address] [facility]) [address], [place] [address]  Please note: We ask at that you not bring children with you during ultrasound (echo/ vascular) testing. Due to room size and safety concerns, children are not allowed in the ultrasound rooms during exams. Our front office staff cannot provide observation of children in our lobby area while testing is  being conducted. An adult accompanying a patient to their appointment will only be allowed in the ultrasound room at the discretion of the ultrasound technician under special circumstances. We apologize for any inconvenience.   Follow-Up: At [facility], you and your health needs are our priority.  As part of our continuing mission to provide you with exceptional heart care, our providers are all part of one team.  This team includes your primary Cardiologist (physician) and Advanced Practice Providers or APPs (Physician Assistants and Nurse Practitioners) who all work together to provide you with the care you need, when you need it.  Your next appointment:   6 week(s)  Provider:   Evalene Lunger, MD or Lesley Maffucci, PA-C

## 2024-03-06 NOTE — Telephone Encounter (Signed)
 Patient wanted to see how her EKG looked. Reviewed results and she verbalized understanding with no further questions at this time.

## 2024-03-06 NOTE — Telephone Encounter (Signed)
 Pt is requesting a callback regarding notes. Please advise

## 2024-03-07 ENCOUNTER — Telehealth: Payer: Self-pay | Admitting: Cardiovascular Disease

## 2024-03-07 NOTE — Telephone Encounter (Signed)
 Pt called and requesting info on her labs drawn. Pt informed the BNP is still pending and when resulted that interpretation would be sent to her in in MyChart.  Pt reports that she prefers a callback instead of MyChart.

## 2024-03-07 NOTE — Telephone Encounter (Signed)
 Patient wants a call back regarding her lab test results.

## 2024-03-08 ENCOUNTER — Other Ambulatory Visit: Payer: Self-pay | Admitting: Emergency Medicine

## 2024-03-08 ENCOUNTER — Ambulatory Visit: Payer: Self-pay | Admitting: Physician Assistant

## 2024-03-08 DIAGNOSIS — Z79899 Other long term (current) drug therapy: Secondary | ICD-10-CM

## 2024-03-08 LAB — BASIC METABOLIC PANEL WITH GFR
BUN/Creatinine Ratio: 20 (ref 12–28)
BUN: 14 mg/dL (ref 8–27)
CO2: 16 mmol/L — ABNORMAL LOW (ref 20–29)
Calcium: 9.4 mg/dL (ref 8.7–10.3)
Chloride: 111 mmol/L — ABNORMAL HIGH (ref 96–106)
Creatinine, Ser: 0.69 mg/dL (ref 0.57–1.00)
Glucose: 111 mg/dL — ABNORMAL HIGH (ref 70–99)
Potassium: 4.3 mmol/L (ref 3.5–5.2)
Sodium: 141 mmol/L (ref 134–144)
eGFR: 92 mL/min/1.73 (ref >59)

## 2024-03-08 LAB — CBC
Hematocrit: 40.7 % (ref 34.0–46.6)
Hemoglobin: 13.2 g/dL (ref 11.1–15.9)
MCH: 30.2 pg (ref 26.6–33.0)
MCHC: 32.4 g/dL (ref 31.5–35.7)
MCV: 93 fL (ref 79–97)
Platelets: 276 x10E3/uL (ref 150–450)
RBC: 4.37 x10E6/uL (ref 3.77–5.28)
RDW: 12.7 % (ref 11.7–15.4)
WBC: 10.9 x10E3/uL — ABNORMAL HIGH (ref 3.4–10.8)

## 2024-03-08 LAB — BRAIN NATRIURETIC PEPTIDE: BNP: 63.7 pg/mL (ref 0.0–100.0)

## 2024-03-08 LAB — TSH: TSH: 0.274 u[IU]/mL — ABNORMAL LOW (ref 0.450–4.500)

## 2024-03-13 ENCOUNTER — Other Ambulatory Visit: Payer: Self-pay | Admitting: Emergency Medicine

## 2024-03-13 DIAGNOSIS — Z79899 Other long term (current) drug therapy: Secondary | ICD-10-CM

## 2024-03-13 DIAGNOSIS — R600 Localized edema: Secondary | ICD-10-CM

## 2024-03-14 LAB — T4, FREE: Free T4: 1.1 ng/dL (ref 0.82–1.77)

## 2024-03-14 LAB — BASIC METABOLIC PANEL WITH GFR
BUN/Creatinine Ratio: 22 (ref 12–28)
BUN: 20 mg/dL (ref 8–27)
CO2: 20 mmol/L (ref 20–29)
Calcium: 9.5 mg/dL (ref 8.7–10.3)
Chloride: 105 mmol/L (ref 96–106)
Creatinine, Ser: 0.9 mg/dL (ref 0.57–1.00)
Glucose: 93 mg/dL (ref 70–99)
Potassium: 4.2 mmol/L (ref 3.5–5.2)
Sodium: 140 mmol/L (ref 134–144)
eGFR: 68 mL/min/1.73 (ref >59)

## 2024-03-14 LAB — TSH: TSH: 1.41 u[IU]/mL (ref 0.450–4.500)

## 2024-03-15 ENCOUNTER — Other Ambulatory Visit (HOSPITAL_BASED_OUTPATIENT_CLINIC_OR_DEPARTMENT_OTHER)

## 2024-03-15 ENCOUNTER — Ambulatory Visit: Payer: Self-pay | Admitting: Physician Assistant

## 2024-03-15 NOTE — Progress Notes (Signed)
 Last read by Rojelio VEAR Pouch Bev at 10:17AM on 03/15/2024.

## 2024-03-29 ENCOUNTER — Other Ambulatory Visit: Payer: Self-pay | Admitting: Primary Care

## 2024-03-29 DIAGNOSIS — K219 Gastro-esophageal reflux disease without esophagitis: Secondary | ICD-10-CM

## 2024-04-05 DIAGNOSIS — F3175 Bipolar disorder, in partial remission, most recent episode depressed: Secondary | ICD-10-CM | POA: Diagnosis not present

## 2024-04-10 ENCOUNTER — Other Ambulatory Visit (HOSPITAL_BASED_OUTPATIENT_CLINIC_OR_DEPARTMENT_OTHER)

## 2024-04-11 ENCOUNTER — Other Ambulatory Visit

## 2024-04-13 ENCOUNTER — Ambulatory Visit

## 2024-04-17 ENCOUNTER — Ambulatory Visit: Admitting: Cardiology

## 2024-04-20 ENCOUNTER — Ambulatory Visit: Admitting: Cardiology

## 2024-04-23 ENCOUNTER — Other Ambulatory Visit: Payer: Self-pay | Admitting: Cardiovascular Disease

## 2024-04-23 DIAGNOSIS — I10 Essential (primary) hypertension: Secondary | ICD-10-CM

## 2024-04-23 DIAGNOSIS — R079 Chest pain, unspecified: Secondary | ICD-10-CM

## 2024-04-25 DIAGNOSIS — Z23 Encounter for immunization: Secondary | ICD-10-CM | POA: Diagnosis not present

## 2024-04-26 ENCOUNTER — Other Ambulatory Visit: Payer: Self-pay | Admitting: Primary Care

## 2024-04-26 DIAGNOSIS — E538 Deficiency of other specified B group vitamins: Secondary | ICD-10-CM

## 2024-04-27 ENCOUNTER — Ambulatory Visit: Admission: RE | Admit: 2024-04-27 | Source: Ambulatory Visit

## 2024-05-08 ENCOUNTER — Ambulatory Visit: Admitting: Cardiology

## 2024-05-09 ENCOUNTER — Ambulatory Visit: Admitting: Primary Care

## 2024-05-10 ENCOUNTER — Other Ambulatory Visit: Payer: Self-pay | Admitting: Cardiovascular Disease

## 2024-05-10 DIAGNOSIS — I10 Essential (primary) hypertension: Secondary | ICD-10-CM

## 2024-05-10 DIAGNOSIS — R079 Chest pain, unspecified: Secondary | ICD-10-CM

## 2024-05-14 ENCOUNTER — Other Ambulatory Visit: Payer: Self-pay | Admitting: Cardiovascular Disease

## 2024-05-14 DIAGNOSIS — I10 Essential (primary) hypertension: Secondary | ICD-10-CM

## 2024-05-14 DIAGNOSIS — R079 Chest pain, unspecified: Secondary | ICD-10-CM

## 2024-05-17 ENCOUNTER — Other Ambulatory Visit (HOSPITAL_BASED_OUTPATIENT_CLINIC_OR_DEPARTMENT_OTHER)

## 2024-05-21 ENCOUNTER — Encounter: Payer: Self-pay | Admitting: Radiology

## 2024-05-25 ENCOUNTER — Encounter: Payer: Self-pay | Admitting: Internal Medicine

## 2024-05-28 ENCOUNTER — Other Ambulatory Visit: Payer: Self-pay | Admitting: Cardiovascular Disease

## 2024-05-28 DIAGNOSIS — R079 Chest pain, unspecified: Secondary | ICD-10-CM

## 2024-05-28 DIAGNOSIS — I10 Essential (primary) hypertension: Secondary | ICD-10-CM

## 2024-05-29 DIAGNOSIS — F3175 Bipolar disorder, in partial remission, most recent episode depressed: Secondary | ICD-10-CM | POA: Diagnosis not present

## 2024-05-30 ENCOUNTER — Telehealth: Payer: Self-pay | Admitting: Cardiovascular Disease

## 2024-05-30 ENCOUNTER — Ambulatory Visit: Attending: Physician Assistant

## 2024-05-30 ENCOUNTER — Other Ambulatory Visit: Payer: Self-pay

## 2024-05-30 MED ORDER — SPIRONOLACTONE 25 MG PO TABS: 25.0000 mg | ORAL_TABLET | Freq: Every day | ORAL | 3 refills | Status: DC

## 2024-05-30 NOTE — Telephone Encounter (Signed)
 Pt c/o medication issue:  1. Name of Medication: Spironolactone  25mg   2. How are you currently taking this medication (dosage and times per day)? NA  3. Are you having a reaction (difficulty breathing--STAT)? No  4. What is your medication issue? Pt states that she was not aware that medication had been d/c. She stated that she still has been taking it. Pt would like for medication to be re prescribed if possible for a 30 day supply and sent to the [facility] on file as well as a c/b.  Please advise

## 2024-05-30 NOTE — Telephone Encounter (Signed)
 Called patient. Left voicemail to return call to office.

## 2024-05-31 ENCOUNTER — Ambulatory Visit: Admitting: Primary Care

## 2024-06-06 ENCOUNTER — Ambulatory Visit: Admitting: Primary Care

## 2024-06-06 VITALS — BP 126/82 | HR 102 | Temp 97.9°F | Ht 62.0 in | Wt 190.6 lb

## 2024-06-06 DIAGNOSIS — R5382 Chronic fatigue, unspecified: Secondary | ICD-10-CM | POA: Diagnosis not present

## 2024-06-06 DIAGNOSIS — Z23 Encounter for immunization: Secondary | ICD-10-CM

## 2024-06-06 DIAGNOSIS — E538 Deficiency of other specified B group vitamins: Secondary | ICD-10-CM | POA: Diagnosis not present

## 2024-06-06 DIAGNOSIS — E559 Vitamin D deficiency, unspecified: Secondary | ICD-10-CM | POA: Diagnosis not present

## 2024-06-06 DIAGNOSIS — R7303 Prediabetes: Secondary | ICD-10-CM

## 2024-06-06 DIAGNOSIS — D509 Iron deficiency anemia, unspecified: Secondary | ICD-10-CM | POA: Diagnosis not present

## 2024-06-06 NOTE — Assessment & Plan Note (Signed)
 Not currently on supplementation.   Vitamin B12 level pending.

## 2024-06-06 NOTE — Addendum Note (Signed)
 Addended by: BEVELY CURTISTINE PARAS on: 06/06/2024 03:42 PM   Modules accepted: Orders

## 2024-06-06 NOTE — Patient Instructions (Signed)
 Stop by the lab prior to leaving today. I will notify you of your results once received.   It was a pleasure to see you today!

## 2024-06-06 NOTE — Assessment & Plan Note (Signed)
 Uncontrolled.  Will check labs today including CBC, iron studies, vitamin B12 and D, A1C Also consider sleep study if labs are normal. She does have chronic tachycardia which could be contributing.  She will speak with cardiology  Continue depression regimen per psychiatry. Await results

## 2024-06-06 NOTE — Assessment & Plan Note (Signed)
Not currently on supplementation.  Repeat vitamin D level pending.

## 2024-06-06 NOTE — Progress Notes (Signed)
 Subjective:    Patient ID: Katherine [name], female    DOB: 14-Oct-1950, 73 y.o.   MRN: [id number]  Katherine [name] is a very pleasant 73 y.o. female with a history of hypertension, migraines, OSA, chronic back pain, depression, prediabetes, vitamin B12 deficiency, vitamin D  deficiency mild cognitive impairment who presents today to discuss fatigue.  Symptom onset 3-4 months ago with generalized fatigue. She will get washed out with minimal activity such as doing anything outdoors, unpacking boxes. She spends most of her day resting. She recently moved to a new home in August 2025.   History of depression and has felt more depressed more recently. Follows with psychiatry and is managed on Abilify  15 mg daily, duloxetine  120 mg daily, temazepam 30 mg at bedtime. Her duloxetine  was increased last week and she's already noticed improvement in her mood.   She is sleeping about 6 hours per night. She does wake up once during the night to use the bathroom, goes back to sleep easily. She's unsure if she snores. She has never used a CPAP machine, never had a sleep study. She has regained the weight that was lost in 2024 as her appetite has returned.   She is not taking vitamin D  or B12, only taking multivitamin. She does have chronic tachycardia, takes propranolol  20 mg PRN when HR is >120.    Review of Systems  Constitutional:  Positive for fatigue.  Respiratory:  Negative for shortness of breath.   Cardiovascular:  Negative for chest pain and palpitations.  Neurological:  Negative for dizziness.         Past Medical History:  Diagnosis Date   Anemia 2018   Anxiety    Arthritis    Borderline diabetes    Cataract    Chest pain    a. 2007 Cath: nl cors.  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 03/2022 MV: EF >65%, no ischemia/infarct. No signif cor/Ao Ca2+. Low risk.   Chicken pox    age 3   Chronic back pain    Compressed discs.  [facility]   Closed comminuted left humeral fracture 06/14/2023   Dark brown-colored urine 03/22/2023   Depression    Diastolic dysfunction    a. 06/2020 Echo: EF 60-65%, no rwma, GrI DD, nl RV size/fxn. RVSP 30.5 mmHg.   Excessive weight loss 03/22/2023   GERD (gastroesophageal reflux disease)    High cholesterol    Hypertension    Hypokalemia    Migraine headache    Obesity    Parkinson's disease (HCC)    Pre-diabetes    okay now    Sinus tachycardia    a. 02/2022 Zio: predominantly sinus rhythm, avg of 99 bpm (77-134). Rare PACs/PVCs. Triggered events = sinus rhythm.    Social History   Socioeconomic History   Marital status: Married    Spouse name: Not on file   Number of children: 1   Years of education: Not on file   Highest education level: Not on file  Occupational History   Occupation: Magazine Features Editor: [employer]    Comment: retired-special needs teacher  Tobacco Use   Smoking status: Never   Smokeless tobacco: Never  Vaping Use   Vaping status: Never Used  Substance and Sexual Activity   Alcohol use: No   Drug use: No   Sexual activity: Yes    Partners: Male    Birth control/protection: Post-menopausal  Other Topics Concern   Not on file  Social History Narrative   Married.   One son is 70, lives in [place].   Retired from agricultural consultant.   Enjoys substitute teaching, gardening, cooking.   Social Drivers of Corporate Investment Banker Strain: Low Risk  (08/01/2023)   Overall Financial Resource Strain (CARDIA)    Difficulty of Paying Living Expenses: Not hard at all  Food Insecurity: No Food Insecurity (08/01/2023)   Hunger Vital Sign    Worried About Running Out of Food in the Last Year: Never true    Ran Out of Food in the Last Year: Never true  Transportation Needs: No Transportation Needs (08/01/2023)   PRAPARE - Administrator, Civil Service (Medical): No    Lack of Transportation (Non-Medical):  No  Physical Activity: Inactive (08/01/2023)   Exercise Vital Sign    Days of Exercise per Week: 0 days    Minutes of Exercise per Session: 0 min  Stress: No Stress Concern Present (08/01/2023)   [employer] of Occupational Health - Occupational Stress Questionnaire    Feeling of Stress : Only a little  Social Connections: Moderately Integrated (08/01/2023)   Social Connection and Isolation Panel    Frequency of Communication with Friends and Family: More than three times a week    Frequency of Social Gatherings with Friends and Family: Once a week    Attends Religious Services: More than 4 times per year    Active Member of [employer] or Organizations: No    Attends Banker Meetings: Never    Marital Status: Married  Catering Manager Violence: Not At Risk (08/01/2023)   Humiliation, Afraid, Rape, and Kick questionnaire    Fear of Current or Ex-Partner: No    Emotionally Abused: No    Physically Abused: No    Sexually Abused: No    Past Surgical History:  Procedure Laterality Date   BLADDER SUSPENSION  2005   CARDIAC CATHETERIZATION     CESAREAN SECTION     CLOSED REDUCTION NASAL FRACTURE N/A 01/11/2020   Procedure: CLOSED REDUCTION NASAL FRACTURE;  Surgeon: Herminio Miu, MD;  Location: [facility];  Service: ENT;  Laterality: N/A;   ENDOMETRIAL ABLATION  2007   FOOT SURGERY     bilateral bunions   LUMBAR LAMINECTOMY/DECOMPRESSION MICRODISCECTOMY Bilateral 01/12/2016   Procedure: Bilateral L4-5 Laminotomy/Foraminotomy;  Surgeon: Reyes Budge, MD;  Location: [facility];  Service: Neurosurgery;  Laterality: Bilateral;  Bilateral L4-5 Laminotomy/Foraminotomy   TONSILLECTOMY  1971    Family History  Problem Relation Age of Onset   Depression Mother    Heart disease Mother    Hypertension Mother    Heart failure Mother        CHF   Diabetes Mother    Cancer Mother        CERVICAL CANCER   Atrial fibrillation Mother    Bipolar disorder Father     Heart disease Father    Diabetes Father    Rashes / Skin problems Father    Hypertension Father    Cirrhosis Father        liver disease non alcoholic   Bipolar disorder Sister    Diabetes Sister    Breast cancer Sister 3   Diabetes Mellitus II Sister    Heart disease Brother    Heart failure Brother        CHF   Diabetes Brother    Cancer Brother 35  THROAT AND NECK   Esophageal cancer Brother    Lung cancer Brother    Atrial fibrillation Brother    Cancer Maternal Grandfather    Colon cancer Neg Hx    Stomach cancer Neg Hx    Rectal cancer Neg Hx     Allergies  Allergen Reactions   Chlorine Other (See Comments)    Smell causes lightheadedness.   Dilaudid  [Hydromorphone ]     hallucinations   Trazodone  And Nefazodone    Codeine Anxiety   Darvon Anxiety   Hydrocodone Anxiety   Meperidine Anxiety   Oxycodone Anxiety   Tramadol  Anxiety   Victoza  [Liraglutide ] Nausea Only and Other (See Comments)    And weakness    Current Outpatient Medications on File Prior to Visit  Medication Sig Dispense Refill   ARIPiprazole  (ABILIFY ) 30 MG tablet Take 15 mg by mouth daily.     DULoxetine  (CYMBALTA ) 30 MG capsule Take 30 mg by mouth daily.     DULoxetine  (CYMBALTA ) 60 MG capsule Take 60 mg by mouth every morning. Takes with her 30mg  capsule to equal 90 mg daily     Multiple Vitamin (MULTIVITAMIN) tablet Take 1 tablet by mouth daily.     pantoprazole  (PROTONIX ) 40 MG tablet TAKE 1 TABLET (40 MG TOTAL) BY MOUTH 2 (TWO) TIMES DAILY BEFORE A MEAL. FOR HEARTBURN. 180 tablet 2   promethazine  (PHENERGAN ) 12.5 MG suppository Place 1 suppository (12.5 mg total) rectally every 6 (six) hours as needed for nausea or vomiting. 12 each 0   propranolol  (INDERAL ) 20 MG tablet TAKE 1 TABLET (20 MG TOTAL) BY MOUTH 3 (THREE) TIMES DAILY AS NEEDED. FOR HEARTRATE >110 BPM 90 tablet 2   spironolactone  (ALDACTONE ) 25 MG tablet Take 1 tablet (25 mg total) by mouth daily. 60 tablet 3   SUMAtriptan   (IMITREX ) 100 MG tablet Take 100 mg by mouth every 2 (two) hours as needed for migraine. May repeat in 2 hours if headache persists or recurs.     topiramate  (TOPAMAX ) 50 MG tablet Take 1 tablet by mouth every morning and 2 tablets at night for migraine prevention. 270 tablet 1   rosuvastatin  (CRESTOR ) 5 MG tablet Take 1 tablet (5 mg total) by mouth daily. for cholesterol. 90 tablet 3   temazepam (RESTORIL) 30 MG capsule Take 30 mg by mouth at bedtime as needed for sleep.     No current facility-administered medications on file prior to visit.    BP 126/82   Pulse (!) 102   Temp 97.9 F (36.6 C) (Oral)   Ht 5' 2 (1.575 m)   Wt 190 lb 9.6 oz (86.5 kg)   SpO2 96%   BMI 34.86 kg/m  Objective:   Physical Exam Cardiovascular:     Rate and Rhythm: Normal rate and regular rhythm.  Pulmonary:     Effort: Pulmonary effort is normal.     Breath sounds: Normal breath sounds.  Musculoskeletal:     Cervical back: Neck supple.  Skin:    General: Skin is warm and dry.  Neurological:     Mental Status: She is alert and oriented to person, place, and time.  Psychiatric:        Mood and Affect: Mood normal.     Physical Exam        Assessment & Plan:  Vitamin B 12 deficiency -     Vitamin B12  Vitamin D  deficiency Assessment & Plan: Not currently on supplementation. Repeat vitamin D  level pending.  Orders: -  VITAMIN D  25 Hydroxy (Vit-D Deficiency, Fractures)  Iron deficiency anemia, unspecified iron deficiency anemia type -     CBC -     IBC + Ferritin  Prediabetes -     Hemoglobin A1c  Vitamin B12 deficiency Assessment & Plan: Not currently on supplementation.   Vitamin B12 level pending.   Chronic fatigue Assessment & Plan: Uncontrolled.  Will check labs today including CBC, iron studies, vitamin B12 and D, A1C Also consider sleep study if labs are normal. She does have chronic tachycardia which could be contributing.  She will speak with  cardiology  Continue depression regimen per psychiatry. Await results     Assessment and Plan Assessment & Plan         Comer MARLA Gaskins, NP     History of Present Illness

## 2024-06-07 ENCOUNTER — Other Ambulatory Visit (HOSPITAL_BASED_OUTPATIENT_CLINIC_OR_DEPARTMENT_OTHER)

## 2024-06-07 LAB — CBC
HCT: 38.6 % (ref 36.0–46.0)
Hemoglobin: 12.6 g/dL (ref 12.0–15.0)
MCHC: 32.6 g/dL (ref 30.0–36.0)
MCV: 90.9 fl (ref 78.0–100.0)
Platelets: 284 K/uL (ref 150.0–400.0)
RBC: 4.25 Mil/uL (ref 3.87–5.11)
RDW: 14.1 % (ref 11.5–15.5)
WBC: 10.3 K/uL (ref 4.0–10.5)

## 2024-06-07 LAB — VITAMIN D 25 HYDROXY (VIT D DEFICIENCY, FRACTURES): VITD: 23.14 ng/mL — ABNORMAL LOW (ref 30.00–100.00)

## 2024-06-07 LAB — IBC + FERRITIN
Ferritin: 6.1 ng/mL — ABNORMAL LOW (ref 10.0–291.0)
Iron: 54 ug/dL (ref 42–145)
Saturation Ratios: 12.3 % — ABNORMAL LOW (ref 20.0–50.0)
TIBC: 438.2 ug/dL (ref 250.0–450.0)
Transferrin: 313 mg/dL (ref 212.0–360.0)

## 2024-06-07 LAB — VITAMIN B12: Vitamin B-12: 300 pg/mL (ref 211–911)

## 2024-06-07 LAB — HEMOGLOBIN A1C: Hgb A1c MFr Bld: 5.5 % (ref 4.6–6.5)

## 2024-06-08 ENCOUNTER — Telehealth: Payer: Self-pay

## 2024-06-08 NOTE — Telephone Encounter (Signed)
 Please review for RAF gap. She was seen in office recently. Do you want us  to have her come in for follow up?

## 2024-06-09 ENCOUNTER — Ambulatory Visit: Payer: Self-pay | Admitting: Primary Care

## 2024-06-09 NOTE — Telephone Encounter (Signed)
 What is her RAF gap?

## 2024-06-11 ENCOUNTER — Telehealth: Payer: Self-pay

## 2024-06-11 ENCOUNTER — Telehealth: Payer: Self-pay | Admitting: Internal Medicine

## 2024-06-11 DIAGNOSIS — Z23 Encounter for immunization: Secondary | ICD-10-CM | POA: Diagnosis not present

## 2024-06-11 NOTE — Telephone Encounter (Signed)
 Lauren/Lindsey, either one of you able to see patient this week?  If, so what would you like scheduled?  Patient called requesting appt for iron infusions.  PCP recommended she call to get iron infusions after last lab results at PCP office.    Last seen by Dr. KATHEE 2023 with did not follow up in 6 months as advised.

## 2024-06-11 NOTE — Telephone Encounter (Signed)
 Patient called requesting appointment to follow up for iron infusions. She stated Dr. Gretta said she is to come for iron infusions. I can't see that she has been here since her new patient appointment in 8/23. Please advise how to schedule this patient. Thank you

## 2024-06-11 NOTE — Telephone Encounter (Signed)
 Morna is reviewing pt's chart.

## 2024-06-12 ENCOUNTER — Ambulatory Visit: Attending: Cardiovascular Disease | Admitting: Cardiovascular Disease

## 2024-06-12 ENCOUNTER — Encounter: Payer: Self-pay | Admitting: Cardiovascular Disease

## 2024-06-12 VITALS — BP 130/74 | HR 100 | Ht 62.0 in | Wt 191.5 lb

## 2024-06-12 DIAGNOSIS — R0600 Dyspnea, unspecified: Secondary | ICD-10-CM | POA: Insufficient documentation

## 2024-06-12 DIAGNOSIS — G3183 Dementia with Lewy bodies: Secondary | ICD-10-CM | POA: Insufficient documentation

## 2024-06-12 DIAGNOSIS — I10 Essential (primary) hypertension: Secondary | ICD-10-CM | POA: Insufficient documentation

## 2024-06-12 DIAGNOSIS — G4730 Sleep apnea, unspecified: Secondary | ICD-10-CM | POA: Insufficient documentation

## 2024-06-12 DIAGNOSIS — E782 Mixed hyperlipidemia: Secondary | ICD-10-CM | POA: Insufficient documentation

## 2024-06-12 DIAGNOSIS — F02818 Dementia in other diseases classified elsewhere, unspecified severity, with other behavioral disturbance: Secondary | ICD-10-CM | POA: Insufficient documentation

## 2024-06-12 DIAGNOSIS — R079 Chest pain, unspecified: Secondary | ICD-10-CM | POA: Diagnosis not present

## 2024-06-12 DIAGNOSIS — R000 Tachycardia, unspecified: Secondary | ICD-10-CM | POA: Insufficient documentation

## 2024-06-12 DIAGNOSIS — R600 Localized edema: Secondary | ICD-10-CM | POA: Insufficient documentation

## 2024-06-12 MED ORDER — METOPROLOL SUCCINATE ER 25 MG PO TB24: 25.0000 mg | ORAL_TABLET | Freq: Every day | ORAL | 3 refills | Status: AC

## 2024-06-12 MED ORDER — SPIRONOLACTONE 25 MG PO TABS: 25.0000 mg | ORAL_TABLET | Freq: Every day | ORAL | 3 refills | Status: AC

## 2024-06-12 MED ORDER — PROPRANOLOL HCL 20 MG PO TABS: 20.0000 mg | ORAL_TABLET | Freq: Three times a day (TID) | ORAL | 2 refills | Status: AC | PRN

## 2024-06-12 NOTE — Progress Notes (Signed)
 Date:  06/12/2024   ID:  Katherine, [name] 1950-08-14, MRN [id number]  Patient Location:  [address] [place] [address]   Provider location:   [facility], [place] office  PCP:  Gretta Comer POUR, NP  Cardiologist:  Perla [facility]  Chief Complaint  Patient presents with   12 month follow up     Patient c/o shortness of breath with little to no activity, weight increase, elevated heart rates and iron depletion.     History of Present Illness:    Katherine [name] is a 73 y.o. female  past medical history of CP with no CAD on cath 2007,  HTN  Labile weight Chronic back pain, prior back surgery Possible sleep apnea by history Parkinson's , seen at [facility] clinic Jan 2023, frontotemporal dementia (FTD) vs Lewy Body Dementia.  She returns today for routine follow-up Of her blood pressure, history of hypokalemia  Last seen in clinic by myself 10/24  Weight on last clinic visit 148, feels that she was depressed Weight today 191 feels better on current medications Eating more but feels she may have overdone it trying to get down 20 pounds  Heart rate higher at home and on recent visit with primary care Anxiety may be playing a role Followed by psychiatry  No regular walking or exercise program, reports that she gets short of breath secondary to her weight  Lab work reviewed A1c 5.5 Iron low, was started on supplement pill Hemoglobin 12.6 B12 low, was started on supplement pill Vitamin D  low, was started on supplement pill Potassium 4.2  Husband does the cooking Previously missing meals  EKG personally reviewed by myself on todays visit EKG Interpretation Date/Time:  Tuesday June 12 2024 14:42:32 EST Ventricular Rate:  100 PR Interval:  112 QRS Duration:  74 QT Interval:  328 QTC Calculation: 423 R Axis:   16  Text Interpretation: Normal sinus rhythm Normal ECG When compared with ECG of 06-Mar-2024 08:08, No  significant change was found Confirmed by Perla Lye [phone]) on 06/12/2024 2:44:17 PM   Other past medical history reviewed Seen in the hospital Dec 02, 2021 with near syncope dizziness and a near-fall.  Patient also reports some nausea without vomiting following the incident had not eaten , skipped breakfast , was rushing through the airport to catch a flight that was ultimately delayed.  Patient began to experience some near syncopal episodes, was able to lower herself to the ground holding onto a chair.  She has had previous episodes of similar incidences  Previously on simvastatin  and carvedilol   Previously reported having hallucinations at times On last clinic visit, thinking of hiring a private detective to monitor her husband who she feels may be having an affair, debit card keeps going missing, money missing from her account, jewelry missing, feels someone is coming into her house and taking her belongings On [employer]   Followed by neurology  Echo   1. Left ventricular ejection fraction, by estimation, is 60 to 65%.   Past Medical History:  Diagnosis Date   Anemia 2018   Anxiety    Arthritis    Borderline diabetes    Cataract    Chest pain    a. 2007 Cath: nl cors.  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 03/2022 MV: EF >65%, no ischemia/infarct. No signif cor/Ao Ca2+. Low risk.   Chicken pox    age 44  Chronic back pain    Compressed discs. [facility]   Closed comminuted left humeral fracture 06/14/2023   Dark brown-colored urine 03/22/2023   Depression    Diastolic dysfunction    a. 06/2020 Echo: EF 60-65%, no rwma, GrI DD, nl RV size/fxn. RVSP 30.5 mmHg.   Excessive weight loss 03/22/2023   GERD (gastroesophageal reflux disease)    High cholesterol    Hypertension    Hypokalemia    Migraine headache    Obesity    Parkinson's disease (HCC)    Pre-diabetes    okay now    Sinus tachycardia     a. 02/2022 Zio: predominantly sinus rhythm, avg of 99 bpm (77-134). Rare PACs/PVCs. Triggered events = sinus rhythm.   Past Surgical History:  Procedure Laterality Date   BLADDER SUSPENSION  2005   CARDIAC CATHETERIZATION     CESAREAN SECTION     CLOSED REDUCTION NASAL FRACTURE N/A 01/11/2020   Procedure: CLOSED REDUCTION NASAL FRACTURE;  Surgeon: Herminio Miu, MD;  Location: [facility];  Service: ENT;  Laterality: N/A;   ENDOMETRIAL ABLATION  2007   FOOT SURGERY     bilateral bunions   LUMBAR LAMINECTOMY/DECOMPRESSION MICRODISCECTOMY Bilateral 01/12/2016   Procedure: Bilateral L4-5 Laminotomy/Foraminotomy;  Surgeon: Reyes Budge, MD;  Location: [facility];  Service: Neurosurgery;  Laterality: Bilateral;  Bilateral L4-5 Laminotomy/Foraminotomy   TONSILLECTOMY  1971     Allergies:   Chlorine, Dilaudid  [hydromorphone ], Trazodone  and nefazodone, Codeine, Darvon, Hydrocodone, Meperidine, Oxycodone, Tramadol , and Victoza  [liraglutide ]   Social History   Tobacco Use   Smoking status: Never   Smokeless tobacco: Never  Vaping Use   Vaping status: Never Used  Substance Use Topics   Alcohol use: No   Drug use: No     Current Outpatient Medications on File Prior to Visit  Medication Sig Dispense Refill   ARIPiprazole  (ABILIFY ) 30 MG tablet Take 15 mg by mouth daily.     DULoxetine  (CYMBALTA ) 30 MG capsule Take 30 mg by mouth daily.     DULoxetine  (CYMBALTA ) 60 MG capsule Take 60 mg by mouth every morning. Takes with her 30mg  capsule to equal 90 mg daily     Multiple Vitamin (MULTIVITAMIN) tablet Take 1 tablet by mouth daily.     pantoprazole  (PROTONIX ) 40 MG tablet TAKE 1 TABLET (40 MG TOTAL) BY MOUTH 2 (TWO) TIMES DAILY BEFORE A MEAL. FOR HEARTBURN. 180 tablet 2   promethazine  (PHENERGAN ) 12.5 MG suppository Place 1 suppository (12.5 mg total) rectally every 6 (six) hours as needed for nausea or vomiting. 12 each 0   propranolol  (INDERAL ) 20 MG tablet TAKE 1 TABLET (20  MG TOTAL) BY MOUTH 3 (THREE) TIMES DAILY AS NEEDED. FOR HEARTRATE >110 BPM 90 tablet 2   rosuvastatin  (CRESTOR ) 5 MG tablet Take 1 tablet (5 mg total) by mouth daily. for cholesterol. 90 tablet 3   spironolactone  (ALDACTONE ) 25 MG tablet Take 1 tablet (25 mg total) by mouth daily. 60 tablet 3   SUMAtriptan  (IMITREX ) 100 MG tablet Take 100 mg by mouth every 2 (two) hours as needed for migraine. May repeat in 2 hours if headache persists or recurs.     temazepam (RESTORIL) 30 MG capsule Take 30 mg by mouth at bedtime as needed for sleep.     topiramate  (TOPAMAX ) 50 MG tablet Take 1 tablet by mouth every morning and 2 tablets at night for migraine prevention. 270 tablet 1   No current facility-administered medications on file prior to  visit.     Family Hx: The patient's family history includes Atrial fibrillation in her brother and mother; Bipolar disorder in her father and sister; Breast cancer (age of onset: 21) in her sister; Cancer in her maternal grandfather and mother; Cancer (age of onset: 5) in her brother; Cirrhosis in her father; Depression in her mother; Diabetes in her brother, father, mother, and sister; Diabetes Mellitus II in her sister; Esophageal cancer in her brother; Heart disease in her brother, father, and mother; Heart failure in her brother and mother; Hypertension in her father and mother; Lung cancer in her brother; Rashes / Skin problems in her father. There is no history of Colon cancer, Stomach cancer, or Rectal cancer.  ROS:   Please see the history of present illness.    Review of Systems  Constitutional: Negative.   HENT: Negative.    Respiratory: Negative.    Cardiovascular: Negative.   Gastrointestinal: Negative.   Musculoskeletal: Negative.   Neurological: Negative.   Psychiatric/Behavioral: Negative.    All other systems reviewed and are negative.    Labs/Other Tests and Data Reviewed:    Recent Labs: 01/17/2024: ALT 13 03/06/2024: BNP 63.7 03/13/2024: BUN  20; Creatinine, Ser 0.90; Potassium 4.2; Sodium 140; TSH 1.410 06/06/2024: Hemoglobin 12.6; Platelets 284.0   Recent Lipid Panel Lab Results  Component Value Date/Time   CHOL 187 01/17/2024 12:47 PM   CHOL 123 06/22/2013 08:09 AM   TRIG 194.0 (H) 01/17/2024 12:47 PM   HDL 68.70 01/17/2024 12:47 PM   HDL 48 06/22/2013 08:09 AM   CHOLHDL 3 01/17/2024 12:47 PM   LDLCALC 79 01/17/2024 12:47 PM   LDLCALC 60 06/22/2013 08:09 AM   LDLDIRECT 143.0 03/10/2021 08:45 AM    Wt Readings from Last 3 Encounters:  06/12/24 191 lb 8 oz (86.9 kg)  06/06/24 190 lb 9.6 oz (86.5 kg)  03/06/24 179 lb (81.2 kg)     Exam:    BP 130/74 (BP Location: Left Arm, Patient Position: Sitting, Cuff Size: Normal)   Pulse 100   Ht 5' 2 (1.575 m)   Wt 191 lb 8 oz (86.9 kg)   SpO2 99%   BMI 35.03 kg/m  Constitutional:  oriented to person, place, and time. No distress.  HENT:  Head: Normocephalic and atraumatic.  Eyes:  no discharge. No scleral icterus.  Neck: Normal range of motion. Neck supple. No JVD present.  Cardiovascular: Normal rate, regular rhythm, normal heart sounds and intact distal pulses. Exam reveals no gallop and no friction rub. No edema No murmur heard. Pulmonary/Chest: Effort normal and breath sounds normal. No stridor. No respiratory distress.  no wheezes.  no rales.  no tenderness.  Abdominal: Soft.  no distension.  no tenderness.  Musculoskeletal: Normal range of motion.  no  tenderness or deformity.  Neurological:  normal muscle tone. Coordination normal. No atrophy Skin: Skin is warm and dry. No rash noted. not diaphoretic.  Psychiatric:  normal mood and affect. behavior is normal. Thought content normal.    ASSESSMENT & PLAN:    Problem List Items Addressed This Visit       Cardiology Problems   Hyperlipidemia   Other Visit Diagnoses       Chest pain, unspecified type    -  Primary   Relevant Orders   EKG 12-Lead (Completed)     Essential hypertension       Relevant  Orders   EKG 12-Lead (Completed)     Lower extremity edema  Tachycardia       Relevant Orders   EKG 12-Lead (Completed)     Dyspnea, unspecified type       Relevant Orders   EKG 12-Lead (Completed)     Sleep-disordered breathing         Lewy body dementia with other behavioral disturbance, unspecified dementia severity (HCC)          Dizziness Chronic issue, Carvedilol  previously held No significant symptoms at this time  Falls Recommend walking program for gait stability  Tachycardia Used to be on coreg  Takes propranolol  as needed,  Now concerned about elevated heart rate again Recommend she try metoprolol  succinate 12.5 daily titrating up to 25 mg daily if needed  Parkinson's/frontotemporal dementia Followed by neurology, seen at [facility] Followed by psychiatry Weight loss last year in the setting of depression, now with weight gain *The importance of walking/exercise program  Fatigue Likely multifactorial Recommend she get on her iron, vitamin D , B12 Walking program Aim for low carbohydrate diet in effort for weight loss  Signed, Evalene Lunger, MD  [facility] [address], [place], [place] [address]

## 2024-06-12 NOTE — Patient Instructions (Addendum)
 Medication Instructions:  Please start metoprolol  succinate 25 mg daily (ok to start on 1/2 pill for 1-2 weeks)  If you need a refill on your cardiac medications before your next appointment, please call your pharmacy.   Lab work: No new labs needed  Testing/Procedures: No new testing needed  Follow-Up: At [facility], you and your health needs are our priority.  As part of our continuing mission to provide you with exceptional heart care, we have created designated Provider Care Teams.  These Care Teams include your primary Cardiologist (physician) and Advanced Practice Providers (APPs -  Physician Assistants and Nurse Practitioners) who all work together to provide you with the care you need, when you need it.  You will need a follow up appointment in 12 months  Providers on your designated Care Team:   Lonni Meager, NP Bernardino Bring, PA-C Cadence [place], [place]  COVID-19 Vaccine Information can be found at: [email or web address] For questions related to vaccine distribution or appointments, please email [email or web address] or call [phone].

## 2024-06-22 ENCOUNTER — Other Ambulatory Visit: Payer: Self-pay | Admitting: *Deleted

## 2024-06-22 DIAGNOSIS — E611 Iron deficiency: Secondary | ICD-10-CM

## 2024-06-25 ENCOUNTER — Encounter: Payer: Self-pay | Admitting: Nurse Practitioner

## 2024-06-25 ENCOUNTER — Inpatient Hospital Stay

## 2024-06-25 ENCOUNTER — Inpatient Hospital Stay: Admitting: Nurse Practitioner

## 2024-06-25 VITALS — BP 108/66 | HR 91 | Temp 97.0°F | Resp 18

## 2024-06-25 VITALS — BP 127/76 | HR 92 | Temp 98.6°F | Resp 19 | Wt 191.6 lb

## 2024-06-25 DIAGNOSIS — Z79899 Other long term (current) drug therapy: Secondary | ICD-10-CM | POA: Diagnosis not present

## 2024-06-25 DIAGNOSIS — D500 Iron deficiency anemia secondary to blood loss (chronic): Secondary | ICD-10-CM | POA: Diagnosis not present

## 2024-06-25 DIAGNOSIS — Z9089 Acquired absence of other organs: Secondary | ICD-10-CM | POA: Insufficient documentation

## 2024-06-25 DIAGNOSIS — E611 Iron deficiency: Secondary | ICD-10-CM | POA: Insufficient documentation

## 2024-06-25 DIAGNOSIS — Z8249 Family history of ischemic heart disease and other diseases of the circulatory system: Secondary | ICD-10-CM | POA: Insufficient documentation

## 2024-06-25 DIAGNOSIS — Z801 Family history of malignant neoplasm of trachea, bronchus and lung: Secondary | ICD-10-CM | POA: Insufficient documentation

## 2024-06-25 DIAGNOSIS — E669 Obesity, unspecified: Secondary | ICD-10-CM | POA: Diagnosis not present

## 2024-06-25 DIAGNOSIS — Z818 Family history of other mental and behavioral disorders: Secondary | ICD-10-CM | POA: Insufficient documentation

## 2024-06-25 DIAGNOSIS — Z888 Allergy status to other drugs, medicaments and biological substances status: Secondary | ICD-10-CM | POA: Insufficient documentation

## 2024-06-25 DIAGNOSIS — K219 Gastro-esophageal reflux disease without esophagitis: Secondary | ICD-10-CM | POA: Diagnosis not present

## 2024-06-25 DIAGNOSIS — Z833 Family history of diabetes mellitus: Secondary | ICD-10-CM | POA: Diagnosis not present

## 2024-06-25 DIAGNOSIS — Z8379 Family history of other diseases of the digestive system: Secondary | ICD-10-CM | POA: Insufficient documentation

## 2024-06-25 DIAGNOSIS — R5383 Other fatigue: Secondary | ICD-10-CM | POA: Insufficient documentation

## 2024-06-25 DIAGNOSIS — Z8049 Family history of malignant neoplasm of other genital organs: Secondary | ICD-10-CM | POA: Diagnosis not present

## 2024-06-25 DIAGNOSIS — I10 Essential (primary) hypertension: Secondary | ICD-10-CM | POA: Diagnosis not present

## 2024-06-25 DIAGNOSIS — R1310 Dysphagia, unspecified: Secondary | ICD-10-CM | POA: Diagnosis not present

## 2024-06-25 DIAGNOSIS — K449 Diaphragmatic hernia without obstruction or gangrene: Secondary | ICD-10-CM | POA: Diagnosis not present

## 2024-06-25 DIAGNOSIS — Z885 Allergy status to narcotic agent status: Secondary | ICD-10-CM | POA: Insufficient documentation

## 2024-06-25 DIAGNOSIS — Z800 Family history of malignant neoplasm of digestive organs: Secondary | ICD-10-CM | POA: Insufficient documentation

## 2024-06-25 DIAGNOSIS — D649 Anemia, unspecified: Secondary | ICD-10-CM | POA: Diagnosis present

## 2024-06-25 DIAGNOSIS — Z803 Family history of malignant neoplasm of breast: Secondary | ICD-10-CM | POA: Diagnosis not present

## 2024-06-25 LAB — CBC WITH DIFFERENTIAL (CANCER CENTER ONLY)
Abs Immature Granulocytes: 0.04 K/uL (ref 0.00–0.07)
Basophils Absolute: 0.1 K/uL (ref 0.0–0.1)
Basophils Relative: 1 %
Eosinophils Absolute: 0.3 K/uL (ref 0.0–0.5)
Eosinophils Relative: 3 %
HCT: 40.8 % (ref 36.0–46.0)
Hemoglobin: 12.7 g/dL (ref 12.0–15.0)
Immature Granulocytes: 0 %
Lymphocytes Relative: 22 %
Lymphs Abs: 2.1 K/uL (ref 0.7–4.0)
MCH: 28.8 pg (ref 26.0–34.0)
MCHC: 31.1 g/dL (ref 30.0–36.0)
MCV: 92.5 fL (ref 80.0–100.0)
Monocytes Absolute: 1 K/uL (ref 0.1–1.0)
Monocytes Relative: 11 %
Neutro Abs: 6.1 K/uL (ref 1.7–7.7)
Neutrophils Relative %: 63 %
Platelet Count: 309 K/uL (ref 150–400)
RBC: 4.41 MIL/uL (ref 3.87–5.11)
RDW: 12.9 % (ref 11.5–15.5)
WBC Count: 9.6 K/uL (ref 4.0–10.5)
nRBC: 0 % (ref 0.0–0.2)

## 2024-06-25 MED ORDER — SODIUM CHLORIDE 0.9 % IV SOLN
510.0000 mg | Freq: Once | INTRAVENOUS | Status: AC
  Administered 2024-06-25: 510 mg via INTRAVENOUS
  Filled 2024-06-25: qty 510
  Filled 2024-06-25: qty 17

## 2024-06-25 NOTE — Progress Notes (Unsigned)
 [facility] CONSULT NOTE  Patient Care Team: Gretta Comer POUR, NP as PCP - General (Nurse Practitioner) Perla Evalene PARAS, MD as PCP - Cardiology (Cardiology) Perla Evalene PARAS, MD as Consulting Physician (Cardiology) Nieves Cough, MD as Consulting Physician (Urology) Darden Planas, NP as Nurse Practitioner Fate Katherine [name], [facility] (Inactive) as Pharmacist (Pharmacist) Rennie Cindy SAUNDERS, MD as Consulting Physician (Internal Medicine)  CHIEF COMPLAINTS/PURPOSE OF CONSULTATION: ANEMIA   HEMATOLOGY HISTORY  # ANEMIA[Hb; MCV-platelets- WBC; Iron sat; ferritin;   EGD- last -2023 showed hiatal hernia with cameron erosions colo 201-  Dr.Perry; [place]];FERRAHEM- DEC 2022 blanch long] PCP-JULY 2023- Iron 54; Iron sat- 12.3%; Hb 12.7  HISTORY OF PRESENTING ILLNESS: By husband.  Ambulating independently.  Katherine [name] 73 y.o.  female pleasant patient was been referred to us  for further evaluation of anemia.  Patient had prior episodes of anemia needing iron infusions in the past.  She complains of worsening fatigue.  Shortness of breath on minimal exertion.  Blood in stools: none Blood in urine: none Difficulty swallowing: Prior blood transfusion: none Prior history of blood loss:  Liver disease:none Alcohol: none Bariatric surgery:none  Vaginal bleeding: none Prior evaluation with hematology:none Oral iron: don't agree- cause contipation Prior IV iron infusions: yes. Darryle Law- last fall, 2022.   Review of Systems  Constitutional:  Positive for malaise/fatigue. Negative for chills, diaphoresis, fever and weight loss.  HENT:  Negative for nosebleeds and sore throat.   Eyes:  Negative for double vision.  Respiratory:  Negative for cough, hemoptysis, sputum production, shortness of breath and wheezing.   Cardiovascular:  Negative for chest pain, palpitations, orthopnea and leg swelling.  Gastrointestinal:  Negative for abdominal pain, blood in stool,  constipation, diarrhea, heartburn, melena, nausea and vomiting.  Genitourinary:  Negative for dysuria, frequency and urgency.  Musculoskeletal:  Negative for back pain and joint pain.  Skin: Negative.  Negative for itching and rash.  Neurological:  Negative for dizziness, tingling, focal weakness, weakness and headaches.  Endo/Heme/Allergies:  Does not bruise/bleed easily.  Psychiatric/Behavioral:  Negative for depression. The patient is not nervous/anxious and does not have insomnia.      MEDICAL HISTORY:  Past Medical History:  Diagnosis Date  . Anemia 2018  . Anxiety   . Arthritis   . Borderline diabetes   . Cataract   . Chest pain    a. 2007 Cath: nl cors.  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 03/2022 MV: EF >65%, no ischemia/infarct. No signif cor/Ao Ca2+. Low risk.  . Chicken pox    age 5  . Chronic back pain    Compressed discs. [facility]  . Closed comminuted left humeral fracture 06/14/2023  . Dark brown-colored urine 03/22/2023  . Depression   . Diastolic dysfunction    a. 06/2020 Echo: EF 60-65%, no rwma, GrI DD, nl RV size/fxn. RVSP 30.5 mmHg.  SABRA Excessive weight loss 03/22/2023  . GERD (gastroesophageal reflux disease)   . High cholesterol   . Hypertension   . Hypokalemia   . Migraine headache   . Obesity   . Parkinson's disease (HCC)   . Pre-diabetes    okay now   . Sinus tachycardia    a. 02/2022 Zio: predominantly sinus rhythm, avg of 99 bpm (77-134). Rare PACs/PVCs. Triggered events = sinus rhythm.    SURGICAL HISTORY: Past Surgical History:  Procedure Laterality Date  . BLADDER SUSPENSION  2005  . CARDIAC CATHETERIZATION    .  CESAREAN SECTION    . CLOSED REDUCTION NASAL FRACTURE N/A 01/11/2020   Procedure: CLOSED REDUCTION NASAL FRACTURE;  Surgeon: Herminio Miu, MD;  Location[facility];  Service: ENT;  Laterality: N/A;  . ENDOMETRIAL ABLATION  2007  . FOOT  SURGERY     bilateral bunions  . LUMBAR LAMINECTOMY/DECOMPRESSION MICRODISCECTOMY Bilateral 01/12/2016   Procedure: Bilateral L4-5 Laminotomy/Foraminotomy;  Surgeon: Reyes Budge, MD;  Location: [facility];  Service: Neurosurgery;  Laterality: Bilateral;  Bilateral L4-5 Laminotomy/Foraminotomy  . TONSILLECTOMY  1971    SOCIAL HISTORY: Social History   Socioeconomic History  . Marital status: Married    Spouse name: Not on file  . Number of children: 1  . Years of education: Not on file  . Highest education level: Not on file  Occupational History  . Occupation: Magazine Features Editor: [employer]    Comment: retired-special needs teacher  Tobacco Use  . Smoking status: Never  . Smokeless tobacco: Never  Vaping Use  . Vaping status: Never Used  Substance and Sexual Activity  . Alcohol use: No  . Drug use: No  . Sexual activity: Yes    Partners: Male    Birth control/protection: Post-menopausal  Other Topics Concern  . Not on file  Social History Narrative   Married.   One son is 52, lives in [place].   Retired from agricultural consultant.   Enjoys substitute teaching, gardening, cooking.   Social Drivers of Health   Financial Resource Strain: Low Risk  (08/01/2023)   Overall Financial Resource Strain (CARDIA)   . Difficulty of Paying Living Expenses: Not hard at all  Food Insecurity: No Food Insecurity (08/01/2023)   Hunger Vital Sign   . Worried About Programme Researcher, Broadcasting/film/video in the Last Year: Never true   . Ran Out of Food in the Last Year: Never true  Transportation Needs: No Transportation Needs (08/01/2023)   PRAPARE - Transportation   . Lack of Transportation (Medical): No   . Lack of Transportation (Non-Medical): No  Physical Activity: Inactive (08/01/2023)   Exercise Vital Sign   . Days of Exercise per Week: 0 days   . Minutes of Exercise per Session: 0 min  Stress: No Stress Concern Present (08/01/2023)   [employer] of Occupational Health - Occupational  Stress Questionnaire   . Feeling of Stress : Only a little  Social Connections: Moderately Integrated (08/01/2023)   Social Connection and Isolation Panel   . Frequency of Communication with Friends and Family: More than three times a week   . Frequency of Social Gatherings with Friends and Family: Once a week   . Attends Religious Services: More than 4 times per year   . Active Member of Clubs or Organizations: No   . Attends Banker Meetings: Never   . Marital Status: Married  Catering Manager Violence: Not At Risk (08/01/2023)   Humiliation, Afraid, Rape, and Kick questionnaire   . Fear of Current or Ex-Partner: No   . Emotionally Abused: No   . Physically Abused: No   . Sexually Abused: No    FAMILY HISTORY: Family History  Problem Relation Age of Onset  . Depression Mother   . Heart disease Mother   . Hypertension Mother   . Heart failure Mother        CHF  . Diabetes Mother   . Cancer Mother        CERVICAL CANCER  . Atrial fibrillation Mother   . Bipolar  disorder Father   . Heart disease Father   . Diabetes Father   . Rashes / Skin problems Father   . Hypertension Father   . Cirrhosis Father        liver disease non alcoholic  . Bipolar disorder Sister   . Diabetes Sister   . Breast cancer Sister 31  . Diabetes Mellitus II Sister   . Heart disease Brother   . Heart failure Brother        CHF  . Diabetes Brother   . Cancer Brother 45       THROAT AND NECK  . Esophageal cancer Brother   . Lung cancer Brother   . Atrial fibrillation Brother   . Cancer Maternal Grandfather   . Colon cancer Neg Hx   . Stomach cancer Neg Hx   . Rectal cancer Neg Hx     ALLERGIES:  is allergic to chlorine, dilaudid  [hydromorphone ], trazodone  and nefazodone, codeine, darvon, hydrocodone, meperidine, oxycodone, tramadol , and victoza  [liraglutide ].  MEDICATIONS:  Current Outpatient Medications  Medication Sig Dispense Refill  . ARIPiprazole  (ABILIFY ) 30 MG tablet  Take 15 mg by mouth daily.    . DULoxetine  (CYMBALTA ) 30 MG capsule Take 30 mg by mouth daily.    . DULoxetine  (CYMBALTA ) 60 MG capsule Take 60 mg by mouth every morning. Takes with her 30mg  capsule to equal 90 mg daily    . metoprolol  succinate (TOPROL -XL) 25 MG 24 hr tablet Take 1 tablet (25 mg total) by mouth daily. Take with or immediately following a meal. 90 tablet 3  . Multiple Vitamin (MULTIVITAMIN) tablet Take 1 tablet by mouth daily.    . pantoprazole  (PROTONIX ) 40 MG tablet TAKE 1 TABLET (40 MG TOTAL) BY MOUTH 2 (TWO) TIMES DAILY BEFORE A MEAL. FOR HEARTBURN. 180 tablet 2  . promethazine  (PHENERGAN ) 12.5 MG suppository Place 1 suppository (12.5 mg total) rectally every 6 (six) hours as needed for nausea or vomiting. 12 each 0  . propranolol  (INDERAL ) 20 MG tablet Take 1 tablet (20 mg total) by mouth 3 (three) times daily as needed. For heartrate >110 bpm 90 tablet 2  . rosuvastatin  (CRESTOR ) 5 MG tablet Take 1 tablet (5 mg total) by mouth daily. for cholesterol. 90 tablet 3  . spironolactone  (ALDACTONE ) 25 MG tablet Take 1 tablet (25 mg total) by mouth daily. 90 tablet 3  . SUMAtriptan  (IMITREX ) 100 MG tablet Take 100 mg by mouth every 2 (two) hours as needed for migraine. May repeat in 2 hours if headache persists or recurs.    . temazepam (RESTORIL) 30 MG capsule Take 30 mg by mouth at bedtime as needed for sleep.    . topiramate  (TOPAMAX ) 50 MG tablet Take 1 tablet by mouth every morning and 2 tablets at night for migraine prevention. 270 tablet 1   No current facility-administered medications for this visit.     SABRA  PHYSICAL EXAMINATION:   Vitals:   06/25/24 1322  BP: 127/76  Pulse: 92  Resp: 19  Temp: 98.6 F (37 C)  SpO2: 99%   Filed Weights   06/25/24 1322  Weight: 191 lb 9.6 oz (86.9 kg)    Physical Exam Vitals and nursing note reviewed.  HENT:     Head: Normocephalic and atraumatic.     Mouth/Throat:     Pharynx: Oropharynx is clear.  Eyes:      Extraocular Movements: Extraocular movements intact.     Pupils: Pupils are equal, round, and reactive to light.  Cardiovascular:  Rate and Rhythm: Normal rate and regular rhythm.  Pulmonary:     Comments: Decreased breath sounds bilaterally.  Abdominal:     Palpations: Abdomen is soft.  Musculoskeletal:        General: Normal range of motion.     Cervical back: Normal range of motion.  Skin:    General: Skin is warm.  Neurological:     General: No focal deficit present.     Mental Status: She is alert and oriented to person, place, and time.  Psychiatric:        Behavior: Behavior normal.        Judgment: Judgment normal.      LABORATORY DATA:  I have reviewed the data as listed Lab Results  Component Value Date   WBC 9.6 06/25/2024   HGB 12.7 06/25/2024   HCT 40.8 06/25/2024   MCV 92.5 06/25/2024   PLT 309 06/25/2024  Iron/TIBC/Ferritin/ %Sat    Component Value Date/Time   IRON 54 06/06/2024 1545   TIBC 438.2 06/06/2024 1545   FERRITIN 6.1 (L) 06/06/2024 1545   IRONPCTSAT 12.3 (L) 06/06/2024 1545   IRONPCTSAT 23 12/03/2022 1610    Recent Labs    01/17/24 1247 03/06/24 0845 03/13/24 1544  NA  --  141 140  K  --  4.3 4.2  CL  --  111* 105  CO2  --  16* 20  GLUCOSE  --  111* 93  BUN  --  14 20  CREATININE  --  0.69 0.90  CALCIUM   --  9.4 9.5  PROT 6.5  --   --   ALBUMIN 4.3  --   --   AST 16  --   --   ALT 13  --   --   ALKPHOS 107  --   --   BILITOT 0.3  --   --   BILIDIR 0.0  --   --      No results found.  Patient Name: MRN: Age: Date of Birth: Account # :  Katherine [name] [id number] 71 3/ 25/ 1952 [id number]  Procedure Date: Endoscopist: Referring MD: Gender:  9/ 5/ 2023 3: 04 PM Norleen [name]. Abran, MD  Female  Procedure: Indications: Medicines: Procedure:  Upper GI endoscopy with balloon dilation of the esophagus. 20 mm max Dysphagia, Therapeutic procedure, Esophageal reflux Monitored Anesthesia Care  Pre- Anesthesia Assessment: - Prior  to the procedure, a History and Physical was performed, and patient medications and allergies were reviewed. The patient' s tolerance of previous anesthesia was also reviewed. The risks and benefits of the procedure and the sedation options and risks were discussed with the patient. All questions were answered, and informed consent was obtained. Prior Anticoagulants: The patient has taken no previous anticoagulant or antiplatelet agents. ASA Grade Assessment: II - A patient with mild systemic disease. After reviewing the risks and benefits, the patient was deemed in satisfactory condition to undergo the procedure. After obtaining informed consent, the endoscope was passed under direct vision. Throughout the procedure, the patient' s blood pressure, pulse, and oxygen saturations were monitored continuously. The Endoscope was introduced through the mouth, and advanced to the second part of duodenum. The upper GI endoscopy was accomplished without difficulty. The patient tolerated the procedure well.  Findings: - One benign- appearing, intrinsic moderate stenosis was found 35 cm from the incisors. This stenosis measured 1. 5 cm ( inner diameter) . A TTS dilator was passed through the scope. Dilation with an 18- 19- 20 mm  balloon dilator was performed to 20 mm. The ring was disrupted. The esophagus was slightly tortuous and foreshortened. - The exam of the esophagus was otherwise normal. - The stomach revealed a 5 cm sliding hiatal hernia. There were associated Cameron erosions. Stomach was otherwise normal. - The examined duodenum was normal. - The cardia and gastric fundus were normal on retroflexion.  ASSESSMENT & PLAN:   Iron deficiency # iron deficiency -iron saturation 12.3% ferritin normal.  Hemoglobin12.7 today 12-13-symptomatic fatigue; denies shortness of breath today Poor tolerance to oral iron unable to take due to GI distress    #  Discussed regarding IV iron infusion/Venofer. Discussed the  potential acute infusion reactions with IV iron; which are quite rare.  Patient understands the risk; will proceed with infusions.  Patient had previous Feraheme  infusions without any side effect.   #Etiology of iron deficiency: Likely secondary to hiatal hernia with cameron erosions see on endoscopy in 2023 Proceed with faraheme today and again next week.   Thank you Ms.Clark NP. for allowing me to participate in the care of your pleasant patient. Please do not hesitate to contact me with questions or concerns in the interim.  Follow up plan: Feraheme  today and again next week  F/U in 3 mths see np/md with labs 2 days before visit cbc with diff, cmp, iron and TIBC, vitamin b12, folate, and vitamin D  +/- feraheme  LP    Cc; Ms.Kathrine Clark.       All questions were answered. The patient knows to call the clinic with any problems, questions or concerns.    Katherine Husband, NP 06/25/2024 3:37 PM

## 2024-06-26 ENCOUNTER — Encounter: Payer: Self-pay | Admitting: Internal Medicine

## 2024-07-02 ENCOUNTER — Encounter

## 2024-07-02 ENCOUNTER — Inpatient Hospital Stay

## 2024-07-02 VITALS — BP 113/65 | HR 98 | Temp 99.4°F

## 2024-07-02 DIAGNOSIS — D649 Anemia, unspecified: Secondary | ICD-10-CM | POA: Diagnosis not present

## 2024-07-02 DIAGNOSIS — E611 Iron deficiency: Secondary | ICD-10-CM

## 2024-07-02 MED ORDER — SODIUM CHLORIDE 0.9 % IV SOLN
510.0000 mg | Freq: Once | INTRAVENOUS | Status: AC
  Administered 2024-07-02: 15:00:00 510 mg via INTRAVENOUS
  Filled 2024-07-02: qty 510

## 2024-07-02 MED ORDER — SODIUM CHLORIDE 0.9 % IV SOLN
Freq: Once | INTRAVENOUS | Status: AC
  Filled 2024-07-02: qty 250

## 2024-07-02 NOTE — Patient Instructions (Signed)

## 2024-07-05 ENCOUNTER — Telehealth: Payer: Self-pay | Admitting: *Deleted

## 2024-07-05 DIAGNOSIS — D500 Iron deficiency anemia secondary to blood loss (chronic): Secondary | ICD-10-CM

## 2024-07-05 DIAGNOSIS — E611 Iron deficiency: Secondary | ICD-10-CM

## 2024-07-05 NOTE — Telephone Encounter (Signed)
 Spoke with patient. Patient informed that Morna would like to check pt's labs again in 4 weeks. We will hold off on additional IV fereheme treatments at ths time given her last hgb was wnl and iron panels were slightly low. She was encouraged to reach out to her pcp for other causes of chronic fatigue. Patient gave verbal understanding. Msg sent to scheduling. Patient will wait for scheduling to reach out to her for the lab only apt. Lab orders entered.

## 2024-07-05 NOTE — Telephone Encounter (Signed)
 Patient reports ongoing fatigue with ADLs. She stated that she had 2 Fereheme treatments and can not tell a difference in how she feels. She would like to come in and have her labs rechecked if possible and be considered for an additional iron infusion. Morna, please advise.

## 2024-07-09 ENCOUNTER — Ambulatory Visit: Admitting: Primary Care

## 2024-07-16 ENCOUNTER — Encounter: Admitting: Internal Medicine

## 2024-07-24 DIAGNOSIS — R7303 Prediabetes: Secondary | ICD-10-CM

## 2024-07-24 DIAGNOSIS — E6609 Other obesity due to excess calories: Secondary | ICD-10-CM

## 2024-07-24 DIAGNOSIS — D509 Iron deficiency anemia, unspecified: Secondary | ICD-10-CM

## 2024-07-24 DIAGNOSIS — E538 Deficiency of other specified B group vitamins: Secondary | ICD-10-CM

## 2024-07-24 DIAGNOSIS — E559 Vitamin D deficiency, unspecified: Secondary | ICD-10-CM

## 2024-07-24 DIAGNOSIS — R5382 Chronic fatigue, unspecified: Secondary | ICD-10-CM

## 2024-07-24 DIAGNOSIS — E782 Mixed hyperlipidemia: Secondary | ICD-10-CM

## 2024-07-24 DIAGNOSIS — I10 Essential (primary) hypertension: Secondary | ICD-10-CM

## 2024-08-01 ENCOUNTER — Inpatient Hospital Stay: Attending: Internal Medicine

## 2024-08-01 DIAGNOSIS — D500 Iron deficiency anemia secondary to blood loss (chronic): Secondary | ICD-10-CM

## 2024-08-01 DIAGNOSIS — D649 Anemia, unspecified: Secondary | ICD-10-CM | POA: Insufficient documentation

## 2024-08-01 DIAGNOSIS — Z79899 Other long term (current) drug therapy: Secondary | ICD-10-CM | POA: Diagnosis not present

## 2024-08-01 DIAGNOSIS — E611 Iron deficiency: Secondary | ICD-10-CM | POA: Diagnosis not present

## 2024-08-01 LAB — CBC WITH DIFFERENTIAL (CANCER CENTER ONLY)
Abs Immature Granulocytes: 0.11 K/uL — ABNORMAL HIGH (ref 0.00–0.07)
Basophils Absolute: 0.1 K/uL (ref 0.0–0.1)
Basophils Relative: 0 %
Eosinophils Absolute: 0 K/uL (ref 0.0–0.5)
Eosinophils Relative: 0 %
HCT: 44.8 % (ref 36.0–46.0)
Hemoglobin: 14.1 g/dL (ref 12.0–15.0)
Immature Granulocytes: 1 %
Lymphocytes Relative: 13 %
Lymphs Abs: 1.7 K/uL (ref 0.7–4.0)
MCH: 30.1 pg (ref 26.0–34.0)
MCHC: 31.5 g/dL (ref 30.0–36.0)
MCV: 95.7 fL (ref 80.0–100.0)
Monocytes Absolute: 0.6 K/uL (ref 0.1–1.0)
Monocytes Relative: 4 %
Neutro Abs: 10.7 K/uL — ABNORMAL HIGH (ref 1.7–7.7)
Neutrophils Relative %: 82 %
Platelet Count: 295 K/uL (ref 150–400)
RBC: 4.68 MIL/uL (ref 3.87–5.11)
RDW: 15.5 % (ref 11.5–15.5)
WBC Count: 13.1 K/uL — ABNORMAL HIGH (ref 4.0–10.5)
nRBC: 0 % (ref 0.0–0.2)

## 2024-08-01 LAB — IRON AND TIBC
Iron: 117 ug/dL (ref 28–170)
Saturation Ratios: 34 % — ABNORMAL HIGH (ref 10.4–31.8)
TIBC: 347 ug/dL (ref 250–450)
UIBC: 230 ug/dL

## 2024-08-01 LAB — FERRITIN: Ferritin: 286 ng/mL (ref 11–307)

## 2024-08-03 ENCOUNTER — Ambulatory Visit: Payer: Medicare Other

## 2024-08-05 ENCOUNTER — Other Ambulatory Visit: Payer: Self-pay | Admitting: Primary Care

## 2024-08-05 DIAGNOSIS — D509 Iron deficiency anemia, unspecified: Secondary | ICD-10-CM

## 2024-08-05 DIAGNOSIS — I10 Essential (primary) hypertension: Secondary | ICD-10-CM

## 2024-08-05 DIAGNOSIS — E6609 Other obesity due to excess calories: Secondary | ICD-10-CM

## 2024-08-05 DIAGNOSIS — E538 Deficiency of other specified B group vitamins: Secondary | ICD-10-CM

## 2024-08-05 DIAGNOSIS — R5382 Chronic fatigue, unspecified: Secondary | ICD-10-CM

## 2024-08-05 DIAGNOSIS — E782 Mixed hyperlipidemia: Secondary | ICD-10-CM

## 2024-08-05 DIAGNOSIS — E559 Vitamin D deficiency, unspecified: Secondary | ICD-10-CM

## 2024-08-05 DIAGNOSIS — R7303 Prediabetes: Secondary | ICD-10-CM

## 2024-08-05 MED ORDER — WEGOVY 1.5 MG PO TABS: 1.5000 mg | ORAL_TABLET | Freq: Every day | ORAL | 0 refills | Status: AC

## 2024-08-06 NOTE — Telephone Encounter (Signed)
 Pls submit PA for Wegovy  1.5 mg.

## 2024-08-07 ENCOUNTER — Encounter: Payer: Self-pay | Admitting: Internal Medicine

## 2024-08-15 ENCOUNTER — Encounter: Payer: Self-pay | Admitting: Internal Medicine

## 2024-08-15 NOTE — Telephone Encounter (Signed)
 Spoke to patient her fatigue is unlikely related to any anemia and or iron deficiency.  Again discussed that intermittent white count could be related to reactive causes rather than any malignant process.  Patient will follow-up with her PCP regarding other causes of her ongoing fatigue.  Please move her appointments out to late June-2026.   GB  FYI-

## 2024-08-20 ENCOUNTER — Telehealth: Payer: Self-pay

## 2024-08-20 ENCOUNTER — Encounter: Payer: Self-pay | Admitting: Internal Medicine

## 2024-08-20 ENCOUNTER — Other Ambulatory Visit (HOSPITAL_COMMUNITY): Payer: Self-pay

## 2024-08-20 NOTE — Telephone Encounter (Signed)
 Pharmacy Patient Advocate Encounter   Received notification from RX Request Messages that prior authorization for Wegovy  1.5mg  tabs is required/requested.   Insurance verification completed.   The patient is insured through [employer].   Per test claim: Per test claim, medication is not covered due to plan/benefit exclusion, PA not submitted at this time

## 2024-08-21 NOTE — Telephone Encounter (Signed)
 Noted.

## 2024-09-21 ENCOUNTER — Inpatient Hospital Stay

## 2024-09-24 ENCOUNTER — Inpatient Hospital Stay: Admitting: Internal Medicine

## 2024-09-24 ENCOUNTER — Inpatient Hospital Stay

## 2024-10-08 ENCOUNTER — Other Ambulatory Visit (HOSPITAL_BASED_OUTPATIENT_CLINIC_OR_DEPARTMENT_OTHER)

## 2024-12-20 ENCOUNTER — Ambulatory Visit: Admitting: Urology

## 2025-01-09 ENCOUNTER — Inpatient Hospital Stay: Admitting: Internal Medicine

## 2025-01-09 ENCOUNTER — Inpatient Hospital Stay
